# Patient Record
Sex: Female | Born: 1955 | Race: Black or African American | Hispanic: No | Smoking: Never smoker
Health system: Southern US, Community
[De-identification: ages and names within clinical notes are randomized; demographics above are authoritative.]

## PROBLEM LIST (undated history)

## (undated) DIAGNOSIS — F329 Major depressive disorder, single episode, unspecified: Secondary | ICD-10-CM

## (undated) DIAGNOSIS — G4752 REM sleep behavior disorder: Secondary | ICD-10-CM

## (undated) DIAGNOSIS — K219 Gastro-esophageal reflux disease without esophagitis: Secondary | ICD-10-CM

## (undated) DIAGNOSIS — K449 Diaphragmatic hernia without obstruction or gangrene: Secondary | ICD-10-CM

## (undated) DIAGNOSIS — M1990 Unspecified osteoarthritis, unspecified site: Secondary | ICD-10-CM

## (undated) DIAGNOSIS — K589 Irritable bowel syndrome without diarrhea: Secondary | ICD-10-CM

## (undated) DIAGNOSIS — F4310 Post-traumatic stress disorder, unspecified: Secondary | ICD-10-CM

## (undated) DIAGNOSIS — N809 Endometriosis, unspecified: Secondary | ICD-10-CM

## (undated) DIAGNOSIS — R55 Syncope and collapse: Secondary | ICD-10-CM

## (undated) DIAGNOSIS — J309 Allergic rhinitis, unspecified: Secondary | ICD-10-CM

## (undated) DIAGNOSIS — I10 Essential (primary) hypertension: Secondary | ICD-10-CM

## (undated) DIAGNOSIS — R441 Visual hallucinations: Secondary | ICD-10-CM

## (undated) DIAGNOSIS — G8929 Other chronic pain: Secondary | ICD-10-CM

## (undated) DIAGNOSIS — C649 Malignant neoplasm of unspecified kidney, except renal pelvis: Secondary | ICD-10-CM

## (undated) DIAGNOSIS — D759 Disease of blood and blood-forming organs, unspecified: Secondary | ICD-10-CM

## (undated) DIAGNOSIS — K222 Esophageal obstruction: Secondary | ICD-10-CM

## (undated) DIAGNOSIS — Z8639 Personal history of other endocrine, nutritional and metabolic disease: Secondary | ICD-10-CM

## (undated) DIAGNOSIS — T7840XA Allergy, unspecified, initial encounter: Secondary | ICD-10-CM

## (undated) DIAGNOSIS — E785 Hyperlipidemia, unspecified: Secondary | ICD-10-CM

## (undated) DIAGNOSIS — F419 Anxiety disorder, unspecified: Secondary | ICD-10-CM

## (undated) DIAGNOSIS — F32A Depression, unspecified: Secondary | ICD-10-CM

## (undated) DIAGNOSIS — I890 Lymphedema, not elsewhere classified: Secondary | ICD-10-CM

## (undated) DIAGNOSIS — H269 Unspecified cataract: Secondary | ICD-10-CM

## (undated) DIAGNOSIS — M549 Dorsalgia, unspecified: Secondary | ICD-10-CM

## (undated) DIAGNOSIS — K5909 Other constipation: Secondary | ICD-10-CM

## (undated) HISTORY — DX: Allergic rhinitis, unspecified: J30.9

## (undated) HISTORY — PX: BREAST SURGERY: SHX581

## (undated) HISTORY — DX: Post-traumatic stress disorder, unspecified: F43.10

## (undated) HISTORY — DX: Lymphedema, not elsewhere classified: I89.0

## (undated) HISTORY — PX: BREAST CYST EXCISION: SHX579

## (undated) HISTORY — DX: Other constipation: K59.09

## (undated) HISTORY — PX: HERNIA REPAIR: SHX51

## (undated) HISTORY — DX: Personal history of other endocrine, nutritional and metabolic disease: Z86.39

## (undated) HISTORY — PX: FUNCTIONAL ENDOSCOPIC SINUS SURGERY: SUR616

## (undated) HISTORY — DX: Unspecified osteoarthritis, unspecified site: M19.90

## (undated) HISTORY — DX: Endometriosis, unspecified: N80.9

## (undated) HISTORY — PX: HEMORRHOID BANDING: SHX5850

## (undated) HISTORY — DX: Diaphragmatic hernia without obstruction or gangrene: K44.9

## (undated) HISTORY — DX: Esophageal obstruction: K22.2

## (undated) HISTORY — PX: COLONOSCOPY: SHX174

## (undated) HISTORY — DX: Malignant neoplasm of unspecified kidney, except renal pelvis: C64.9

## (undated) HISTORY — DX: Essential (primary) hypertension: I10

## (undated) HISTORY — DX: Syncope and collapse: R55

## (undated) HISTORY — DX: Irritable bowel syndrome without diarrhea: K58.9

## (undated) HISTORY — DX: Gastro-esophageal reflux disease without esophagitis: K21.9

## (undated) HISTORY — DX: Unspecified cataract: H26.9

## (undated) HISTORY — DX: REM sleep behavior disorder: G47.52

## (undated) HISTORY — DX: Allergy, unspecified, initial encounter: T78.40XA

## (undated) HISTORY — PX: LASER ABLATION OF THE CERVIX: SHX1949

## (undated) HISTORY — PX: OTHER SURGICAL HISTORY: SHX169

## (undated) HISTORY — PX: TUBAL LIGATION: SHX77

## (undated) HISTORY — PX: UMBILICAL HERNIA REPAIR: SHX196

## (undated) HISTORY — PX: NASAL SINUS SURGERY: SHX719

## (undated) HISTORY — DX: Visual hallucinations: R44.1

## (undated) HISTORY — DX: Hyperlipidemia, unspecified: E78.5

---

## 2002-04-17 ENCOUNTER — Other Ambulatory Visit: Admission: RE | Admit: 2002-04-17 | Discharge: 2002-04-17 | Payer: Self-pay | Admitting: Family Medicine

## 2002-05-28 ENCOUNTER — Encounter: Payer: Self-pay | Admitting: Family Medicine

## 2002-05-28 ENCOUNTER — Encounter: Admission: RE | Admit: 2002-05-28 | Discharge: 2002-05-28 | Payer: Self-pay | Admitting: Family Medicine

## 2002-07-10 ENCOUNTER — Encounter: Payer: Self-pay | Admitting: Gastroenterology

## 2002-07-10 LAB — HM COLONOSCOPY: HM Colonoscopy: NORMAL

## 2002-08-12 ENCOUNTER — Ambulatory Visit (HOSPITAL_COMMUNITY): Admission: RE | Admit: 2002-08-12 | Discharge: 2002-08-12 | Payer: Self-pay | Admitting: Gastroenterology

## 2002-08-12 ENCOUNTER — Encounter: Payer: Self-pay | Admitting: Gastroenterology

## 2002-08-24 ENCOUNTER — Encounter (INDEPENDENT_AMBULATORY_CARE_PROVIDER_SITE_OTHER): Payer: Self-pay | Admitting: Specialist

## 2002-08-24 ENCOUNTER — Observation Stay (HOSPITAL_COMMUNITY): Admission: RE | Admit: 2002-08-24 | Discharge: 2002-08-25 | Payer: Self-pay | Admitting: *Deleted

## 2003-08-10 ENCOUNTER — Ambulatory Visit (HOSPITAL_COMMUNITY): Admission: RE | Admit: 2003-08-10 | Discharge: 2003-08-10 | Payer: Self-pay | Admitting: Internal Medicine

## 2003-08-19 ENCOUNTER — Other Ambulatory Visit: Admission: RE | Admit: 2003-08-19 | Discharge: 2003-08-19 | Payer: Self-pay | Admitting: *Deleted

## 2004-08-21 ENCOUNTER — Other Ambulatory Visit: Admission: RE | Admit: 2004-08-21 | Discharge: 2004-08-21 | Payer: Self-pay | Admitting: Obstetrics and Gynecology

## 2007-01-03 ENCOUNTER — Encounter: Admission: RE | Admit: 2007-01-03 | Discharge: 2007-01-03 | Payer: Self-pay | Admitting: Internal Medicine

## 2008-01-11 ENCOUNTER — Emergency Department (HOSPITAL_COMMUNITY): Admission: EM | Admit: 2008-01-11 | Discharge: 2008-01-11 | Payer: Self-pay | Admitting: Family Medicine

## 2008-01-20 ENCOUNTER — Ambulatory Visit (HOSPITAL_COMMUNITY): Admission: RE | Admit: 2008-01-20 | Discharge: 2008-01-20 | Payer: Self-pay | Admitting: Internal Medicine

## 2008-01-28 ENCOUNTER — Ambulatory Visit (HOSPITAL_COMMUNITY): Admission: RE | Admit: 2008-01-28 | Discharge: 2008-01-28 | Payer: Self-pay | Admitting: Internal Medicine

## 2008-09-28 ENCOUNTER — Encounter: Admission: RE | Admit: 2008-09-28 | Discharge: 2008-09-28 | Payer: Self-pay | Admitting: Otolaryngology

## 2008-10-11 ENCOUNTER — Encounter (INDEPENDENT_AMBULATORY_CARE_PROVIDER_SITE_OTHER): Payer: Self-pay | Admitting: Otolaryngology

## 2008-10-11 ENCOUNTER — Ambulatory Visit (HOSPITAL_BASED_OUTPATIENT_CLINIC_OR_DEPARTMENT_OTHER): Admission: RE | Admit: 2008-10-11 | Discharge: 2008-10-11 | Payer: Self-pay | Admitting: Otolaryngology

## 2008-12-09 ENCOUNTER — Encounter: Payer: Self-pay | Admitting: Gastroenterology

## 2008-12-23 LAB — CONVERTED CEMR LAB: Pap Smear: NORMAL

## 2009-02-25 ENCOUNTER — Encounter: Payer: Self-pay | Admitting: Cardiology

## 2009-02-25 ENCOUNTER — Encounter: Payer: Self-pay | Admitting: Internal Medicine

## 2009-02-25 LAB — CONVERTED CEMR LAB
ALT: 18 units/L
AST: 24 units/L
Albumin: 4.1 g/dL
Alkaline Phosphatase: 85 units/L
BUN: 14 mg/dL
CO2: 23 meq/L
Calcium: 9.7 mg/dL
Chloride: 104 meq/L
Creatinine, Ser: 0.92 mg/dL
Glucose, Bld: 93 mg/dL
Potassium: 3.6 meq/L
Sodium: 143 meq/L
Total Bilirubin: 0.3 mg/dL
Total Protein: 7.3 g/dL

## 2009-03-11 ENCOUNTER — Encounter: Payer: Self-pay | Admitting: Internal Medicine

## 2009-03-11 LAB — CONVERTED CEMR LAB
Cholesterol: 203 mg/dL
HDL: 59 mg/dL
LDL (calc): 13 mg/dL
LDL Cholesterol: 131 mg/dL
TSH: 2.26 microintl units/mL
Triglyceride fasting, serum: 66 mg/dL

## 2009-03-15 ENCOUNTER — Encounter: Payer: Self-pay | Admitting: Gastroenterology

## 2009-03-23 ENCOUNTER — Ambulatory Visit (HOSPITAL_COMMUNITY): Admission: RE | Admit: 2009-03-23 | Discharge: 2009-03-23 | Payer: Self-pay | Admitting: Internal Medicine

## 2009-03-29 ENCOUNTER — Encounter (INDEPENDENT_AMBULATORY_CARE_PROVIDER_SITE_OTHER): Payer: Self-pay | Admitting: *Deleted

## 2009-04-25 ENCOUNTER — Encounter: Payer: Self-pay | Admitting: Internal Medicine

## 2009-05-06 DIAGNOSIS — I10 Essential (primary) hypertension: Secondary | ICD-10-CM | POA: Insufficient documentation

## 2009-05-06 DIAGNOSIS — K219 Gastro-esophageal reflux disease without esophagitis: Secondary | ICD-10-CM | POA: Insufficient documentation

## 2009-05-06 DIAGNOSIS — K5909 Other constipation: Secondary | ICD-10-CM | POA: Insufficient documentation

## 2009-05-06 DIAGNOSIS — N809 Endometriosis, unspecified: Secondary | ICD-10-CM | POA: Insufficient documentation

## 2009-05-06 HISTORY — DX: Essential (primary) hypertension: I10

## 2009-05-06 HISTORY — DX: Endometriosis, unspecified: N80.9

## 2009-05-12 ENCOUNTER — Ambulatory Visit: Payer: Self-pay | Admitting: Gastroenterology

## 2009-05-16 DIAGNOSIS — R55 Syncope and collapse: Secondary | ICD-10-CM | POA: Insufficient documentation

## 2009-05-16 HISTORY — DX: Syncope and collapse: R55

## 2009-05-25 ENCOUNTER — Encounter: Admission: RE | Admit: 2009-05-25 | Discharge: 2009-06-22 | Payer: Self-pay | Admitting: Internal Medicine

## 2009-06-01 ENCOUNTER — Ambulatory Visit: Payer: Self-pay | Admitting: Cardiology

## 2009-06-14 ENCOUNTER — Ambulatory Visit: Payer: Self-pay | Admitting: Internal Medicine

## 2009-06-14 DIAGNOSIS — J329 Chronic sinusitis, unspecified: Secondary | ICD-10-CM | POA: Insufficient documentation

## 2009-06-14 DIAGNOSIS — Z9189 Other specified personal risk factors, not elsewhere classified: Secondary | ICD-10-CM | POA: Insufficient documentation

## 2009-06-14 DIAGNOSIS — IMO0002 Reserved for concepts with insufficient information to code with codable children: Secondary | ICD-10-CM | POA: Insufficient documentation

## 2009-06-14 DIAGNOSIS — R519 Headache, unspecified: Secondary | ICD-10-CM | POA: Insufficient documentation

## 2009-06-14 DIAGNOSIS — Z8639 Personal history of other endocrine, nutritional and metabolic disease: Secondary | ICD-10-CM | POA: Insufficient documentation

## 2009-06-14 DIAGNOSIS — G43909 Migraine, unspecified, not intractable, without status migrainosus: Secondary | ICD-10-CM

## 2009-06-14 DIAGNOSIS — Z860100 Personal history of colon polyps, unspecified: Secondary | ICD-10-CM | POA: Insufficient documentation

## 2009-06-14 DIAGNOSIS — R51 Headache: Secondary | ICD-10-CM

## 2009-06-14 DIAGNOSIS — M1710 Unilateral primary osteoarthritis, unspecified knee: Secondary | ICD-10-CM

## 2009-06-14 HISTORY — DX: Migraine, unspecified, not intractable, without status migrainosus: G43.909

## 2009-06-16 ENCOUNTER — Ambulatory Visit: Payer: Self-pay | Admitting: Internal Medicine

## 2009-06-16 ENCOUNTER — Encounter: Payer: Self-pay | Admitting: Cardiology

## 2009-06-16 ENCOUNTER — Ambulatory Visit (HOSPITAL_COMMUNITY): Admission: RE | Admit: 2009-06-16 | Discharge: 2009-06-16 | Payer: Self-pay | Admitting: Cardiology

## 2009-06-16 ENCOUNTER — Ambulatory Visit: Payer: Self-pay

## 2009-06-30 ENCOUNTER — Encounter: Payer: Self-pay | Admitting: Internal Medicine

## 2009-06-30 ENCOUNTER — Encounter: Admission: RE | Admit: 2009-06-30 | Discharge: 2009-09-28 | Payer: Self-pay | Admitting: Internal Medicine

## 2009-07-01 ENCOUNTER — Telehealth: Payer: Self-pay | Admitting: Internal Medicine

## 2009-07-07 ENCOUNTER — Encounter (INDEPENDENT_AMBULATORY_CARE_PROVIDER_SITE_OTHER): Payer: Self-pay | Admitting: *Deleted

## 2009-07-18 ENCOUNTER — Encounter: Payer: Self-pay | Admitting: Internal Medicine

## 2009-07-18 ENCOUNTER — Telehealth: Payer: Self-pay | Admitting: Internal Medicine

## 2009-08-22 ENCOUNTER — Ambulatory Visit: Payer: Self-pay | Admitting: Internal Medicine

## 2009-08-22 DIAGNOSIS — M79609 Pain in unspecified limb: Secondary | ICD-10-CM | POA: Insufficient documentation

## 2009-08-22 DIAGNOSIS — J40 Bronchitis, not specified as acute or chronic: Secondary | ICD-10-CM | POA: Insufficient documentation

## 2009-08-26 ENCOUNTER — Ambulatory Visit: Payer: Self-pay | Admitting: Gastroenterology

## 2009-08-26 DIAGNOSIS — F449 Dissociative and conversion disorder, unspecified: Secondary | ICD-10-CM | POA: Insufficient documentation

## 2009-08-30 ENCOUNTER — Telehealth: Payer: Self-pay | Admitting: Internal Medicine

## 2009-08-31 ENCOUNTER — Ambulatory Visit: Payer: Self-pay | Admitting: Internal Medicine

## 2009-08-31 DIAGNOSIS — J309 Allergic rhinitis, unspecified: Secondary | ICD-10-CM | POA: Insufficient documentation

## 2009-08-31 HISTORY — DX: Allergic rhinitis, unspecified: J30.9

## 2009-09-04 LAB — CONVERTED CEMR LAB: Hgb A1c MFr Bld: 5.9 % (ref 4.6–6.5)

## 2009-09-19 ENCOUNTER — Ambulatory Visit (HOSPITAL_COMMUNITY): Admission: RE | Admit: 2009-09-19 | Discharge: 2009-09-19 | Payer: Self-pay | Admitting: Gastroenterology

## 2009-09-19 ENCOUNTER — Encounter: Payer: Self-pay | Admitting: Gastroenterology

## 2009-09-28 ENCOUNTER — Ambulatory Visit: Payer: Self-pay | Admitting: Gastroenterology

## 2009-10-03 ENCOUNTER — Ambulatory Visit: Payer: Self-pay | Admitting: Internal Medicine

## 2009-10-03 DIAGNOSIS — E669 Obesity, unspecified: Secondary | ICD-10-CM | POA: Insufficient documentation

## 2009-10-31 ENCOUNTER — Telehealth: Payer: Self-pay | Admitting: Internal Medicine

## 2009-11-03 ENCOUNTER — Telehealth: Payer: Self-pay | Admitting: Internal Medicine

## 2009-12-14 ENCOUNTER — Ambulatory Visit: Payer: Self-pay | Admitting: Internal Medicine

## 2009-12-22 ENCOUNTER — Emergency Department (HOSPITAL_COMMUNITY): Admission: EM | Admit: 2009-12-22 | Discharge: 2009-12-22 | Payer: Self-pay | Admitting: Emergency Medicine

## 2010-03-15 ENCOUNTER — Telehealth: Payer: Self-pay | Admitting: Internal Medicine

## 2010-03-15 ENCOUNTER — Ambulatory Visit: Payer: Self-pay | Admitting: Internal Medicine

## 2010-03-15 DIAGNOSIS — I890 Lymphedema, not elsewhere classified: Secondary | ICD-10-CM | POA: Insufficient documentation

## 2010-03-16 DIAGNOSIS — E7800 Pure hypercholesterolemia, unspecified: Secondary | ICD-10-CM

## 2010-03-16 DIAGNOSIS — E785 Hyperlipidemia, unspecified: Secondary | ICD-10-CM

## 2010-03-16 HISTORY — DX: Pure hypercholesterolemia, unspecified: E78.00

## 2010-03-16 HISTORY — DX: Hyperlipidemia, unspecified: E78.5

## 2010-03-16 LAB — CONVERTED CEMR LAB
BUN: 15 mg/dL (ref 6–23)
CO2: 35 meq/L — ABNORMAL HIGH (ref 19–32)
Calcium: 10.1 mg/dL (ref 8.4–10.5)
Chloride: 100 meq/L (ref 96–112)
Cholesterol: 205 mg/dL — ABNORMAL HIGH (ref 0–200)
Creatinine, Ser: 0.8 mg/dL (ref 0.4–1.2)
Direct LDL: 140.5 mg/dL
GFR calc non Af Amer: 98.74 mL/min (ref >60)
Glucose, Bld: 91 mg/dL (ref 70–99)
HDL: 53.4 mg/dL (ref >39.00)
Hgb A1c MFr Bld: 6 % (ref 4.6–6.5)
Potassium: 4 meq/L (ref 3.5–5.1)
Sodium: 142 meq/L (ref 135–145)
Total CHOL/HDL Ratio: 4
Triglycerides: 95 mg/dL (ref 0.0–149.0)
VLDL: 19 mg/dL (ref 0.0–40.0)

## 2010-03-22 ENCOUNTER — Telehealth: Payer: Self-pay | Admitting: Internal Medicine

## 2010-03-24 ENCOUNTER — Ambulatory Visit (HOSPITAL_COMMUNITY): Admission: RE | Admit: 2010-03-24 | Discharge: 2010-03-24 | Payer: Self-pay | Admitting: Internal Medicine

## 2010-03-24 LAB — HM MAMMOGRAPHY: HM Mammogram: NEGATIVE

## 2010-04-05 ENCOUNTER — Telehealth (INDEPENDENT_AMBULATORY_CARE_PROVIDER_SITE_OTHER): Payer: Self-pay | Admitting: *Deleted

## 2010-04-14 ENCOUNTER — Encounter: Payer: Self-pay | Admitting: Internal Medicine

## 2010-04-14 ENCOUNTER — Ambulatory Visit: Payer: Self-pay | Admitting: Internal Medicine

## 2010-04-14 ENCOUNTER — Telehealth: Payer: Self-pay | Admitting: Internal Medicine

## 2010-04-18 ENCOUNTER — Encounter: Payer: Self-pay | Admitting: Internal Medicine

## 2010-07-23 LAB — CONVERTED CEMR LAB
BUN: 17 mg/dL
CO2: 27 meq/L
Chloride: 101 meq/L
Creatinine, Ser: 0.87 mg/dL
Glucose, Bld: 82 mg/dL
Hgb A1c MFr Bld: 6.2 %
Potassium: 3.9 meq/L
Sodium: 140 meq/L

## 2010-07-25 NOTE — Progress Notes (Signed)
Summary: ABX refill  Phone Note Call from Patient Call back at Home Phone [phone]   Caller: Patient Summary of Call: pt called stating that she is still having cough and increased nasal mucous. pt is requesting refill of ABX and cough meds. please advise Initial call taken by: Margaret Pyle, CMA,  August 30, 2009 1:57 PM  Follow-up for Phone Call        ok to re-tx once (zpack and renew promethazne vc - can call in) but if cont symptoms after this round, will need re-eval with OV - thanks Follow-up by: Newt Lukes MD,  August 30, 2009 2:56 PM    New/Updated Medications: ZITHROMAX 250 MG TABS (AZITHROMYCIN) 2 tabs by mouth today then 1 tab by mouth  every day for 5 days Prescriptions: PROMETHAZINE VC 6.25-5 MG/5ML SYRP (PROMETHAZINE-PHENYLEPHRINE) 5 cc by mouth every 4 hours as needed for cough  #120cc x 0   Entered by:   Margaret Pyle, CMA   Authorized by:   Newt Lukes MD   Signed by:   Margaret Pyle, CMA on 08/30/2009   Method used:   [facility]        [facility] [address] [phone]* (retail)       [address]       [place], [place]  [address]       Ph: [phone] or [phone]       Fax: [phone]   RxID:   [id number] ZITHROMAX 250 MG TABS (AZITHROMYCIN) 2 tabs by mouth today then 1 tab by mouth  every day for 5 days  #6 x 0   Entered by:   Margaret Pyle, CMA   Authorized by:   Newt Lukes MD   Signed by:   Margaret Pyle, CMA on 08/30/2009   Method used:   [facility]ically to        [facility] [address] [phone]* (retail)       [address]       [place], [place]  [address]       Ph: [phone] or [phone]       Fax: [phone]   RxID:   [id number]

## 2010-07-25 NOTE — Progress Notes (Signed)
Summary: rx request  Phone Note Call from Patient Call back at Home Phone [phone]   Caller: Patient Summary of Call: pt called requesting New Rx to mail order co, [facility] due to Insurance change. Initial call taken by: Margaret Pyle, CMA,  July 18, 2009 3:58 PM    Prescriptions: FLUTICASONE PROPIONATE 50 MCG/ACT SUSP (FLUTICASONE PROPIONATE) 1 spray each nostril  every morning  #3 x 3   Entered by:   Margaret Pyle, CMA   Authorized by:   Newt Lukes MD   Signed by:   Margaret Pyle, CMA on 07/18/2009   Method used:   Faxed to ...       [facility]* (mail-order)             ,          Ph: [phone]       Fax: [phone]   RxID:   [id number] DEXILANT 60 MG CPDR (DEXLANSOPRAZOLE) 1 by mouth q am  #90 x 3   Entered by:   Margaret Pyle, CMA   Authorized by:   Newt Lukes MD   Signed by:   Margaret Pyle, CMA on 07/18/2009   Method used:   Faxed to ...       [facility]* (mail-order)             ,          Ph: [phone]       Fax: [phone]   RxID:   [id number] DIOVAN HCT 320-25 MG TABS (VALSARTAN-HYDROCHLOROTHIAZIDE) once daily  #90 x 3   Entered by:   Margaret Pyle, CMA   Authorized by:   Newt Lukes MD   Signed by:   Margaret Pyle, CMA on 07/18/2009   Method used:   Faxed to ...       [facility]* (mail-order)             ,          Ph: [phone]       Fax: [phone]   RxID:   [id number]

## 2010-07-25 NOTE — Assessment & Plan Note (Signed)
Summary: CHRONIC REFLUX/YF   History of Present Illness Visit Type: Initial Consult Primary GI MD: Sheryn Bison MD FACP FAGA Primary Provider: Kellie Shropshire, MD Requesting Provider: Amada Jupiter, MD Chief Complaint: GERD / controlled with medicaition116 History of Present Illness:   This is a very complex 55 year old African American female with multiple medical problems who comes to the office today complaining mostly of intermittent syncope which has been present apparently since childhood without a definite diagnosis identified. She's a patient of Dr. Kellie Shropshire and wants to switch primary care doctors. 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 She denies palpitations or exertional chest pain or history of known peripheral vascular disease.  His chronic acid reflux which has been evaluated previously with endoscopy. She currently is on Prilosec without control of her reflux symptoms which she describes mostly as a globus sensation in her throat without true regurgitation or burning substernal pain. She suffered from chronic obesity. She has chronic functional constipation with previous negative colonoscopy. She denies significant intolerances. She denies rectal bleeding or chronic anemia. I have no real records for review today his septum or ENT doctor who has performed surgery per her chronic sinusitis. She continues with postnasal drip and nasal congestion. She does not have true dysphagia anorexia or weight loss.  She currently sees gynecology and has had a previous right ovarian cyst in 2004 with gynecologic surgery. Again I do not have a records of gynecologic recent evaluations.   GI Review of Systems    Reports acid reflux and  bloating.      Denies abdominal pain, belching, chest pain, dysphagia with liquids, dysphagia with solids, heartburn, loss of appetite, nausea, vomiting, vomiting blood, weight loss, and  weight gain.     Reports constipation.     Denies anal fissure, black tarry stools, change in bowel habit, diarrhea, diverticulosis, fecal incontinence, heme positive stool, hemorrhoids, irritable bowel syndrome, jaundice, light color stool, liver problems, rectal bleeding, and  rectal pain. Preventive Screening-Counseling & Management  Alcohol-Tobacco     Smoking Status: never      Drug Use:  no.      Current Medications (verified): 1)  Omeprazole 20 Mg Cpdr (Omeprazole) .[name].. 1 By Mouth Qd 2)  Lortab 7.5-500 Mg/54ml Elix (Hydrocodone-Acetaminophen) .[name].. 1 By Mouth Q 4-6 Hrs Prn 3)  Diovan Hct 320-25 Mg Tabs (Valsartan-Hydrochlorothiazide) .... Once Daily 4)  Promethazine Hcl 25 Mg Supp (Promethazine Hcl) .... Insert One Per Recutm Q 6 Hrs Prn 5)  Omega-3 Complex 192-251-11 Mg-Mg-Unit Caps (Dha-Epa-Vitamin E) .... Take 2 Capsules By Mouth Daily 6)  Emergen-C .... Take 1 Packet Daaily 7)  Ibuprofen 800 Mg Tabs (Ibuprofen) .... Three Times A Day 8)  Vitamin E 400 Unit Caps (Vitamin E) .... Once Daily 9)  Polyethylene Glycol 3350  Powd (Polyethylene Glycol 3350) .... As Needed 10)  Vitamin D3 1000 Unit Tabs (Cholecalciferol) .... Take 2 Tablets Daily  Allergies (verified): 1)  ! Codeine 2)  ! Darvon  Past History:  Past medical, surgical, family and social histories (including risk factors) reviewed for relevance to current acute and chronic problems.  Past Medical History: Reviewed history from 05/06/2009 and no changes required. Current Problems:  CONSTIPATION, CHRONIC (ICD-564.09) GERD (ICD-530.81) ENDOMETRIOSIS (ICD-617.9) HYPERTENSION (ICD-401.9)  Past Surgical History: Hernia Surgery Bilateral Breast Surgery Bilateral Tubal Ligation Ablation  Family History: Reviewed history and no changes required. Family History of Prostate Cancer:Father Family History of Colitis/Crohn'sBrother: Family History of Diabetes: Mother Family History  of Kidney Disease:Father  Social  History: Reviewed history and no changes required. 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  Alcohol Use - no Illicit Drug Use - no Smoking Status:  never Drug Use:  no  Review of Systems       The patient complains of allergy/sinus, anxiety-new, arthritis/joint pain, change in vision, cough, depression-new, fatigue, headaches-new, sleeping problems, swelling of feet/legs, and vision changes.  The patient denies anemia, back pain, blood in urine, breast changes/lumps, confusion, coughing up blood, fainting, fever, hearing problems, heart murmur, heart rhythm changes, itching, menstrual pain, muscle pains/cramps, night sweats, nosebleeds, pregnancy symptoms, shortness of breath, skin rash, sore throat, swollen lymph glands, thirst - excessive , urination - excessive , urination changes/pain, urine leakage, and voice change.    Vital Signs:  Patient profile:   55 year old female Height:      62 inches Weight:      240 pounds BMI:     44.06 Pulse rate:   60 / minute Pulse rhythm:   regular BP sitting:   116 / 78  (left arm) Cuff size:   large  Vitals Entered By: June McMurray CMA Duncan Dull) (May 12, 2009 10:17 AM)  Physical Exam  General:  Well developed, well nourished, no acute distress.obese.   Head:  Normocephalic and atraumatic. Eyes:  PERRLA, no icterus.exam deferred to patient's ophthalmologist.   Mouth:  No deformity or lesions, dentition normal. Neck:  Supple; no masses or thyromegaly. Lungs:  Clear throughout to auscultation. Heart:  Regular rate and rhythm; no murmurs, rubs,  or bruits. Abdomen:  Soft, nontender and nondistended. No masses, hepatosplenomegaly or hernias noted. Normal bowel sounds.obese.   Pulses:  Normal pulses noted. Extremities:  No clubbing, cyanosis, edema or deformities noted. Neurologic:  Alert and  oriented x4;  grossly normal neurologically. Cervical Nodes:  No significant cervical adenopathy. Inguinal Nodes:  No significant  inguinal adenopathy. Psych:  Alert and cooperative. Normal mood and affect.   Impression & Recommendations:  Problem # 1:  CONSTIPATION, CHRONIC (ICD-564.09) Assessment Unchanged I have urged her to take MiraLax 8 ounces on a regular basis at bedtime. I do not think she needs repeat colonoscopy at this time.  Problem # 2:  GERD (ICD-530.81) Assessment: Unchanged Change to Dexilant 60 mg 30 minutes before breakfast along with standard antireflux maneuvers.  Problem # 3:  HYPERTENSION (ICD-401.9) Assessment: Improved blood pressure today is 116/78 and her to continue her antihypertensive medications. Cause of her recurrent syncope which may be from autonomic dysfunction I've scheduled her to see cardiology for further evaluation. At her request, we will also try to set her up for primary care visit with our practice.  Patient Instructions: 1)  Copy sent to : [facility]  2)  Please continue current medications.  3)  Avoid foods high in acid content ( tomatoes, citrus juices, spicy foods) . Avoid eating within 3 to 4 hours of lying down or before exercising. Do not over eat; try smaller more frequent meals. Elevate head of bed four inches when sleeping.  4)  change from Nexium to Dexilant 5)  High Fiber, Low Fat  Healthy Eating Plan brochure given.  6)  Constipation and Hemorrhoids brochure given.   Appended Document: CHRONIC REFLUX/YF    Clinical Lists Changes  Medications: Removed medication of OMEPRAZOLE 20 MG CPDR (OMEPRAZOLE) 1 by mouth qd Added new medication of DEXILANT 60 MG CPDR (DEXLANSOPRAZOLE) 1 by mouth q am - Signed Added new medication of POLYETHYLENE  GLYCOL 3350  POWD (POLYETHYLENE GLYCOL 3350) Take q hs as directed - Signed Rx of DEXILANT 60 MG CPDR (DEXLANSOPRAZOLE) 1 by mouth q am;  #30 x 6;  Signed;  Entered by: Ashok Cordia RN;  Authorized by: Mardella Layman MD [facility];  Method used: [facility] [address] [phone]*, [address] [place], [place], [place]  [address], Ph: [phone] or [phone], Fax: [phone] Rx of POLYETHYLENE GLYCOL 3350  POWD (POLYETHYLENE GLYCOL 3350) Take q hs as directed;  #522 gm x 11;  Signed;  Entered by: Ashok Cordia RN;  Authorized by: Mardella Layman MD [facility];  Method used: Electronically to [facility] [address] [phone]*, [address] [place], [place], [place]  [address], Ph: [phone] or [phone], Fax: [phone]    Prescriptions: POLYETHYLENE GLYCOL 3350  POWD (POLYETHYLENE GLYCOL 3350) Take q hs as directed  #522 gm x 11   Entered by:   Ashok Cordia RN   Authorized by:   Mardella Layman MD [facility]   Signed by:   Ashok Cordia RN on 05/12/2009   Method used:   Electronically to        [facility] [address] [phone]* (retail)       [address]       [place], [place]  [address]       Ph: [phone] or [phone]       Fax: [phone]   RxID:   [id number] DEXILANT 60 MG CPDR (DEXLANSOPRAZOLE) 1 by mouth q am  #30 x 6   Entered by:   Ashok Cordia RN   Authorized by:   Mardella Layman MD [facility]   Signed by:   Ashok Cordia RN on 05/12/2009   Method used:   Electronically to        [facility] [address] [phone]* (retail)       [address]       [place], [place]  [address]       Ph: [phone] or [phone]       Fax: [phone]   RxID:   [id number]    Appended Document: CHRONIC REFLUX/YF    Clinical Lists Changes  Problems: Added new problem of SYNCOPE (ICD-780.2) Orders: Added new Referral order of Cardiology Referral (Cardiology) - Signed Added new Referral order of Primary Care Referral (Primary) - Signed      Appended Document: CHRONIC REFLUX/YF Left message for pt to call re referrals.  Appended Document: CHRONIC REFLUX/YF Pt notified of referrals.

## 2010-07-25 NOTE — Procedures (Signed)
Summary: manometry & PH   Esophageal Manometry  Procedure date:  09/19/2009  Findings:      normal:     Esophageal manometry and 24-hour pH probe testing was completed on September 19, 2009. Results are as follows:        Esophageal Manometry  #1. Lower esophageal sphincter-normal mean pressure of 17 mm of mercury with normal relaxation of swallowing  #2. Upper-Esophageal sphincter-normal coordination between pharyngeal contraction and cricopharyngeal relaxation  #3 Motility Pattern-there is normal esophageal peristalsis with high amplitude contractions but no spontaneous or repetitive contractions. Mean amplitude is 198 mmHg with duration 5.2 seconds.  Assessment: This is normal esophageal manometry.               24 hour pH probe results  Total DeMeester score is normal at 4.8 normal less than 22. Percentage of time the pH is less than 4 is normal at 1.3%. Her 24 reflux episodes the longest lasting 2 minutes. There is no significant acid reflux and the proximal or distal channels in the upright or supine position. Symptom analysis is entirely negative.  Assessment: This is normal 24 pH probe test without evidence of acid reflux. I do not think that this patient's globus reaction is on the basis of GERD. We'll recommend primary care followup.   Appended Document: manometry Pt notified of results.

## 2010-07-25 NOTE — Assessment & Plan Note (Signed)
Summary: 6 mo rov /nws   Vital Signs:  Patient profile:   55 year old female Height:      62 inches (157.48 cm) Weight:      228.8 pounds (104 kg) O2 Sat:      99 % on Room air Temp:     97.8 degrees F (36.56 degrees C) oral Pulse rate:   85 / minute BP sitting:   118 / 76  (left arm) Cuff size:   large  Vitals Entered By: Orlan Leavens (December 14, 2009 10:16 AM)  O2 Flow:  Room air CC: 6 month follow-up Is Patient Diabetic? Yes Did you bring your meter with you today? No Pain Assessment Patient in pain? no        Primary Care Provider:  Newt Lukes MD  CC:  6 month follow-up.  History of Present Illness: 1) left foot neuropathy  cont pain left lateral foot but intermittent and less severe -  describes as "burning" saw podiatry for same - no dx made: "everything is fine" no injury - no new footware - no numbness into leg or medial side  2) obesity - working hard with meal replacement and exercise to lose weight - feels clothes are fitting better - size 20, now 18 - down 4 lbs since 09/2009  3) seasonal allg - worse during spring season - prefers claritin to Careers adviser for symptoms control - uses nasal spray most days no fever or sinus pressure or pain - no HA or cough or ST  4) GERD - reports indigestion better if taking ppi meds - no pain or reflux or change in BM -100% med compliance  5) HTN - reports compliance with ongoing medical treatment and no changes in medication dose or frequency. denies adverse side effects related to current therapy. no increase in edema, no CP or HA  6) diet controlled DM - watching carbs and losing weight efforts as above - take cinnamon but no med rx - does not check home cbg regularly  Preventive Screening-Counseling & Management  Alcohol-Tobacco     Alcohol drinks/day: <1     Smoking Status: quit > 6 months     Tobacco Counseling: to remain off tobacco products  Caffeine-Diet-Exercise     Caffeine use/day: 0     Diet  Counseling: to improve diet; diet is suboptimal     Does Patient Exercise: no     Exercise Counseling: to improve exercise regimen     Depression Counseling: not indicated; screening negative for depression  Clinical Review Panels:  Prevention   Last Mammogram:  normal (12/23/2008)   Last Pap Smear:  normal (12/23/2008)   Last Colonoscopy:  Location:  [facility].  (07/10/2002)  Lipid Management   Cholesterol:  203 (03/11/2009)   LDL (bad choesterol):  131 (03/11/2009)   HDL (good cholesterol):  59 (03/11/2009)   Triglycerides:  66 (03/11/2009)  Diabetes Management   HgBA1C:  5.9 (08/31/2009)   Creatinine:  0.87 (04/25/2009)   Last Flu Vaccine:  Historical (03/25/2009)  Complete Metabolic Panel   Glucose:  82 (04/25/2009)   Sodium:  140 (04/25/2009)   Potassium:  3.9 (04/25/2009)   Chloride:  101 (04/25/2009)   CO2:  27 (04/25/2009)   BUN:  17 (04/25/2009)   Creatinine:  0.87 (04/25/2009)   Albumin:  4.1 (02/25/2009)   Total Protein:  7.3 (02/25/2009)   Calcium:  9.7 (02/25/2009)   Total Bili:  0.3 (02/25/2009)   Alk Phos:  85 (  02/25/2009)   SGPT (ALT):  18 (02/25/2009)   SGOT (AST):  24 (02/25/2009)   Current Medications (verified): 1)  Diovan Hct 320-25 Mg Tabs (Valsartan-Hydrochlorothiazide) .... Once Daily 2)  Omega-3 Complex 192-251-11 Mg-Mg-Unit Caps (Dha-Epa-Vitamin E) .... Take 2 Capsules By Mouth Daily 3)  Ibuprofen 800 Mg Tabs (Ibuprofen) .... Three Times A Day 4)  Vitamin E 400 Unit Caps (Vitamin E) .... Once Daily 5)  Polyethylene Glycol 3350  Powd (Polyethylene Glycol 3350) .... As Needed 6)  Dexilant 60 Mg Cpdr (Dexlansoprazole) .[name].. 1 By Mouth Q Am 7)  Accu-Chek Compact Test Drum  Strp (Glucose Blood) .... Test 1-2 Times Daily As Needed Dx: 790.29 8)  Accu-Chek Compact Plus Care  Kit (Blood Glucose Monitoring Suppl) .... Glucometer Test 1-2 Times Per Day As Needed Dx: 790.29 9)  Calcium 600mg  T Vitamin D 1000mg  .... Take 1 Two Times A  Day 10)  Cinnamon .[name].. 1 Tsp By Mouth Two Times A Day 11)  Calcium-Vitamin D [phone] Mg-Unit Tabs (Calcium-Vitamin D) .... Take 4 Once Daily 12)  Fluticasone Propionate 50 Mcg/act Susp (Fluticasone Propionate) .[name].. 1 Spray Each Nostril  Every Morning 13)  Loratadine 10 Mg Tabs (Loratadine) .[name].. 1 By Mouth Every Evening 14)  Energizing Soy Protein .... Once Q Am 15)  Vanilla Shake Mix .... Q Am 16)  Osteomatrix .... Take 1 Q Am 17)  Gabapentin 300 Mg Caps (Gabapentin) .[name].. 1 By Mouth At Bedtime For Foot Pain 18)  Accu-Chek Softclix Lancets  Misc (Lancets) .... Use As Directed Two Times A Day  Allergies (verified): 1)  ! Codeine 2)  ! Darvon  Past History:  Past Medical History: 1. CONSTIPATION, CHRONIC  2. GERD  with hiatal hernia.  3. ENDOMETRIOSIS 4. HYPERTENSION 5. Diabetes: diet-controlled.  6. Syncopal episodes since childhood. 7. Chronic low back pain. 8. Chronic sinusitis.  MD roster- cards - [facility] ([place])  Review of Systems  The patient denies fever, chest pain, syncope, and headaches.    Physical Exam  General:  overweight-appearing.  alert, well-developed, well-nourished, and cooperative to examination.   g-son at side Lungs:   normal respiratory effort, no intercostal retractions or use of accessory muscles; normal breath sounds bilaterally - no crackles and no wheezes.    Heart:  normal rate, regular rhythm, no murmur, and no rub. BLE with chronic lymphedema, L>R.  Psych:  Oriented X3, memory intact for recent and remote, normally interactive, good eye contact, not anxious appearing, not depressed appearing, and not agitated.      Impression & Recommendations:  Problem # 1:  DIABETES MELLITUS, BORDERLINE (ICD-790.29)  dx per pt from prior PCP - diet controlled cont nutrition counseling and encouraged p maitain increased phys activity along with diet changes in effort to continue losing weight and  control her dz Time spent with patient 25 minutes, more than 50% of this time was spent counseling patient on diabetes and sinusitus and problems concerning the need for medication compliance as welll as weight loss and exercise for health mgmt  Labs Reviewed: Creat: 0.87 (04/25/2009)    A1c - 6.2 08/2009  Problem # 2:  OBESITY (ICD-278.00)  motivated by desire to avoid DM - encouraged cont of same efforts  Ht: 62 (10/03/2009)   Wt: 232.12 (10/03/2009)   BMI: 42.84 (08/26/2009)  Ht: 62 (12/14/2009)   Wt: 228.8 (12/14/2009)     Problem # 3:  HYPERTENSION (ICD-401.9)  add as needed lasix for edema - worse in summer -  Her updated medication list for this problem includes:    Diovan Hct 320-25 Mg Tabs (Valsartan-hydrochlorothiazide) ..... Once daily    Furosemide 20 Mg Tabs (Furosemide) .[name]... 1 by mouth once daily or as directed  BP today: 118/76 Prior BP: 124/72 (10/03/2009)  Labs Reviewed: K+: 3.9 (04/25/2009) Creat: : 0.87 (04/25/2009)   Chol: 203 (03/11/2009)   HDL: 59 (03/11/2009)   LDL: 13 (03/11/2009)   TG: 66 (03/11/2009)  Problem # 4:  GERD (ICD-530.81)  Her updated medication list for this problem includes:    Dexilant 60 Mg Cpdr (Dexlansoprazole) .[name]... 1 by mouth q am  EGD: Location: [facility]   (07/10/2002)  Problem # 5:  ALLERGIC RHINITIS (ICD-477.9)  Her updated medication list for this problem includes:    Fluticasone Propionate 50 Mcg/act Susp (Fluticasone propionate) .[name]... 1 spray each nostril  every morning    Loratadine 10 Mg Tabs (Loratadine) .[name]... 1 by mouth every evening  Discussed use of allergy medications and environmental measures.   Complete Medication List: 1)  Diovan Hct 320-25 Mg Tabs (Valsartan-hydrochlorothiazide) .... Once daily 2)  Omega-3 Complex 192-251-11 Mg-mg-unit Caps (Dha-epa-vitamin e) .... Take 2 capsules by mouth daily 3)  Ibuprofen 800 Mg Tabs (Ibuprofen) .... Three times a day 4)  Vitamin E 400 Unit Caps  (Vitamin e) .... Once daily 5)  Polyethylene Glycol 3350 Powd (Polyethylene glycol 3350) .... As needed 6)  Dexilant 60 Mg Cpdr (Dexlansoprazole) .[name].. 1 by mouth q am 7)  Accu-chek Compact Test Drum Strp (Glucose blood) .... Test 1-2 times daily as needed dx: 790.29 8)  Accu-chek Compact Plus Care Kit (Blood glucose monitoring suppl) .... Glucometer test 1-2 times per day as needed dx: 790.29 9)  Calcium 600mg  T Vitamin D 1000mg   .... Take 1 two times a day 10)  Cinnamon  .[name].. 1 tsp by mouth two times a day 11)  Calcium-vitamin D [phone] Mg-unit Tabs (calcium-vitamin D)  .... Take 4 once daily 12)  Fluticasone Propionate 50 Mcg/act Susp (Fluticasone propionate) .[name].. 1 spray each nostril  every morning 13)  Loratadine 10 Mg Tabs (Loratadine) .[name].. 1 by mouth every evening 14)  Energizing Soy Protein  .... Once q am 15)  Vanilla Shake Mix  .... Q am 16)  Osteomatrix  .... Take 1 q am 17)  Gabapentin 300 Mg Caps (Gabapentin) .[name].. 1 by mouth at bedtime for foot pain 18)  Accu-chek Softclix Lancets Misc (Lancets) .... Use as directed two times a day 19)  Furosemide 20 Mg Tabs (Furosemide) .[name].. 1 by mouth once daily or as directed  Patient Instructions: 1)  it was good to see you today. 2)  start new medication for fluid in your feet - lasix (furosemide) - your prescription has been electronically submitted to [facility]. Please take as directed. Contact our office if you believe you're having problems with the medication(s).  3)  continue to watch your diet and weight as you are doing - keep up the good work! 4)  Please schedule a follow-up appointment in 3-4 months, sooner if problems. Prescriptions: FUROSEMIDE 20 MG TABS (FUROSEMIDE) 1 by mouth once daily or as directed  #90 x 1   Entered and Authorized by:   Newt Lukes MD   Signed by:   Newt Lukes MD on 12/14/2009   Method used:   Faxed to ...       [facility]* (retail)             ,  Ph: [phone]       Fax:  [phone]   RxID:   [id number]

## 2010-07-25 NOTE — Letter (Signed)
Summary: Office Visit Letter  [facility]  [address] [place], [place] [address]   Phone: [phone]  Fax: [phone]      July 07, 2009 MRN: [id number]   Janet Le [address] [place], [place]  [address]   Dear [name],   According to our records, it is time for you to schedule a follow-up office visit with [place].   At your convenience, please call [phone] (option #2)to schedule an office visit. If you have any questions, concerns, or feel that this letter is in error, we would appreciate your call.   Sincerely,  Vania Rea. Jarold Motto, M.D.  [facility] [phone]

## 2010-07-25 NOTE — Progress Notes (Signed)
Summary: REFERRAL REQUEST  Phone Note Call from Patient   Summary of Call: Patient is requesting referral to lympadema center at [facility]. Fax # [phone], Please put req in for therapist Tammy.  Initial call taken by: Lamar Sprinkles, CMA,  April 14, 2010 12:12 PM  Follow-up for Phone Call        request ordered as listed - pcc will arrange Follow-up by: Newt Lukes MD,  April 14, 2010 1:30 PM

## 2010-07-25 NOTE — Procedures (Signed)
Summary: Colon   Colonoscopy  Procedure date:  07/10/2002  Findings:      Location:  [facility].    Colonoscopy  Procedure date:  07/10/2002  Findings:      Location:  [facility].   Patient Name: Janet Le, Anes MRN:  Procedure Procedures: Colonoscopy CPT: [phone].  Personnel: Endoscopist: Vania Rea. Jarold Motto, MD.  Exam Location: Exam performed in Outpatient Clinic. Outpatient  Patient Consent: Procedure, Alternatives, Risks and Benefits discussed, consent obtained, from patient. Consent was obtained by the RN.  Indications Symptoms: Constipation  History  Pre-Exam Physical: Performed Jul 10, 2002. Cardio-pulmonary exam, Rectal exam, Abdominal exam, Extremity exam, Mental status exam WNL.  Exam Exam: Extent of exam reached: Cecum, extent intended: Cecum.  The cecum was identified by appendiceal orifice and IC valve. Patient position: on left side. Duration of exam: 20 minutes. Colon retroflexion performed. Images taken. ASA Classification: I. Tolerance: excellent.  Monitoring: Pulse and BP monitoring, Oximetry used. Supplemental O2 given.  Colon Prep Used Golytely for colon prep. Prep results: excellent.  Sedation Meds: Patient assessed and found to be appropriate for moderate (conscious) sedation. Fentanyl 50 mcg. given IV. Versed 5 mg. given IV.  Instrument(s): CF 140L. Serial [id number].  Findings - NORMAL EXAM: Cecum to Rectum. Not Seen: Polyps. AVM's. Colitis. Tumors. Crohn's. Diverticulosis. Hemorrhoids.   Assessment Normal examination.  Events  Unplanned Interventions: No intervention was required.  Plans Medication Plan: Fiber supplements: Methylcellulose 1 tsp QAM, starting Jul 10, 2002 for indefinitely.  Zelnorm: 6mg . BID, starting Jul 10, 2002 for m.   Patient Education: Patient given standard instructions for: Constipation. Disposition: After procedure patient sent to recovery. After recovery patient sent  home.  Scheduling/Referral: Clinic Visit, to Vania Rea. Jarold Motto, MD, around Aug 10, 2002.    cc: Gershon Crane. MD

## 2010-07-25 NOTE — Progress Notes (Signed)
Summary: liodocaine cream  Phone Note Refill Request Message from:  Fax from Pharmacy on March 15, 2010 2:43 PM  Refills Requested: Medication #1:  Lidocaine 3% HC 0.5% cream Insert per rectum 2 times a day   Last Refilled: [facility]/2008 [facility]. Is this ok to renew?  Initial call taken by: Orlan Leavens RMA,  March 15, 2010 2:45 PM  Follow-up for Phone Call        ok to fill as prev rx'd - thx Follow-up by: Newt Lukes MD,  March 15, 2010 3:46 PM    New/Updated Medications: LIDOCAINE-HYDROCORTISONE ACE 3-0.5 % CREA (LIDOCAINE-HYDROCORTISONE ACE) apply two times a day to rectum Prescriptions: LIDOCAINE-HYDROCORTISONE ACE 3-0.5 % CREA (LIDOCAINE-HYDROCORTISONE ACE) apply two times a day to rectum  #1 x 1   Entered by:   Orlan Leavens RMA   Authorized by:   Newt Lukes MD   Signed by:   Orlan Leavens RMA on 03/15/2010   Method used:   [facility]        [facility]  [address] [phone]* (retail)       [address]       [place], [place]  [address]       Ph: [phone] or [phone]       Fax: [phone]   RxID:   [id number]

## 2010-07-25 NOTE — Assessment & Plan Note (Signed)
Summary: COUGH/ CONGESTION/ WANTS COUGH SYRUP/ NO FEVER/NWS  #   Vital Signs:  Patient profile:   55 year old female Height:      62 inches (157.48 cm) Weight:      233.8 pounds (106.27 kg) O2 Sat:      97 % on Room air Temp:     98.2 degrees F (36.78 degrees C) oral Pulse rate:   82 / minute BP sitting:   112 / 88  (right arm) Cuff size:   large  Vitals Entered By: Orlan Leavens (August 31, 2009 11:03 AM)  O2 Flow:  Room air CC: cough and chest congestion/ pt states she have'nt started antibiotic or cough syrup that was sent in yesterday Is Patient Diabetic? Yes Did you bring your meter with you today? No Pain Assessment Patient in pain? no        Primary Care Provider:  Newt Lukes MD  CC:  cough and chest congestion/ pt states she have'nt started antibiotic or cough syrup that was sent in yesterday.  History of Present Illness:  continued sinus problems -      This is a 55 year old woman who presents sinus congestion, nasal congestion and drainage.  The symptoms began 3+ weeks ago.  The severity is described as moderate-severe.  The patient reports associated dry cough due to drainage, but denies fever, sore throat, and earache.  Prior fever and "infection" improved after tx with Zpack last OV but still wheezing at night.  The patient denies vomiting and diarrhea.  The patient also reports requiring sinus sugery last year for similar symptoms unimproved with med tx.  The patient reports compliance with allergy medications and nasal spray.  Reports symptoms worse during the spring allergy season and weather changes  Current Medications (verified): 1)  Diovan Hct 320-25 Mg Tabs (Valsartan-Hydrochlorothiazide) .... Once Daily 2)  Omega-3 Complex 192-251-11 Mg-Mg-Unit Caps (Dha-Epa-Vitamin E) .... Take 2 Capsules By Mouth Daily 3)  Ibuprofen 800 Mg Tabs (Ibuprofen) .... Three Times A Day 4)  Vitamin E 400 Unit Caps (Vitamin E) .... Once Daily 5)  Polyethylene Glycol 3350   Powd (Polyethylene Glycol 3350) .... As Needed 6)  Dexilant 60 Mg Cpdr (Dexlansoprazole) .[name].. 1 By Mouth Q Am 7)  Loratadine 10 Mg Tabs (Loratadine) .[name].. 1 By Mouth Once Daily 8)  Fluticasone Propionate 50 Mcg/act Susp (Fluticasone Propionate) .[name].. 1 Spray Each Nostril  Every Morning 9)  Accu-Chek Compact Test Drum  Strp (Glucose Blood) .... Test 1-2 Times Daily As Needed Dx: 790.29 10)  Accu-Chek Compact Plus Care  Kit (Blood Glucose Monitoring Suppl) .... Glucometer Test 1-2 Times Per Day As Needed Dx: 790.29 11)  Calcium 600mg  T Vitamin D 1000mg  .... Take 1 Two Times A Day 12)  Promethazine Vc 6.25-5 Mg/77ml Syrp (Promethazine-Phenylephrine) .... 5 Cc By Mouth Every 4 Hours As Needed For Cough 13)  Benzonatate 100 Mg Caps (Benzonatate) .[name].. 1 By Mouth Three Times A Day As Needed For Cough 14)  Cinnamon .[name].. 1 Tsp Daily 15)  Zithromax 250 Mg Tabs (Azithromycin) .... 2 Tabs By Mouth Today Then 1 Tab By Mouth  Every Day For 5 Days 16)  Calcium-Vitamin D 7088296949 Mg-Unit Tabs (Calcium-Vitamin D) .... Take 4 Once Daily  Allergies (verified): 1)  ! Codeine 2)  ! Darvon  Past History:  Past medical, surgical, family and social histories (including risk factors) reviewed, and no changes noted (except as noted below).  Past Medical History: Reviewed history from  06/14/2009 and no changes required. 1. CONSTIPATION, CHRONIC (ICD-564.09) 2. GERD (ICD-530.81) with hiatal hernia.  3. ENDOMETRIOSIS (ICD-617.9) 4. HYPERTENSION (ICD-401.9) 5. Diabetes: diet-controlled.  6. Syncopal episodes since childhood. 7. Chronic low back pain. 8. Chronic sinusitis.   MD rooster- cards - [facility] ([facility])  Past Surgical History: Reviewed history from 06/14/2009 and no changes required. Hernia Surgery Bilateral Breast Surgery Bilateral Tubal Ligation Ablation Breast biopsy (4782 & 1995) sinus surg - 2007, 2010  Family History: Reviewed history  from 06/01/2009 and no changes required. Family History of Prostate Cancer:Father Family History of Colitis/Crohn'sBrother: Family History of Diabetes: Mother Family History of Kidney Disease:Father  No premature CAD  Social History: Reviewed history from 06/14/2009 and no changes required. 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  lives with 10yo g-son, separated from spouse Alcohol Use - no Illicit Drug Use - no  Review of Systems       The patient complains of prolonged cough.  The patient denies fever, vision loss, decreased hearing, chest pain, dyspnea on exertion, and headaches.         also see HPI above. I have reviewed all other systems and they were negative.   Physical Exam  General:  overweight-appearing.  alert, well-developed, well-nourished, and cooperative to examination.    Ears:  normal pinnae bilaterally, without erythema, swelling, or tenderness to palpation. TMs clear, without effusion, or cerumen impaction. Hearing grossly normal bilaterally  Mouth:  teeth and gums in good repair; mucous membranes moist, without lesions or ulcers. oropharynx clear without exudate, no erythema.  +PND Lungs:  few rhonchi bilaterally - otherwise, normal respiratory effort, no intercostal retractions or use of accessory muscles; normal breath sounds bilaterally - no crackles and no wheezes.    Heart:  normal rate, regular rhythm, no murmur, and no rub. BLE with chronic lymphedema, L>R. normal DP pulses and normal cap refill in all 4 extremities      Impression & Recommendations:  Problem # 1:  SINUSITIS, CHRONIC (ICD-473.9) no evidence for infx without fever - hold abx prior sinus surg hx reviewed  will re-refer now (pt desires second opinion from ENT) and max med tx of underlying allergic component tx night cough symptoms with cough suppressant The following medications were removed from the medication list:    Zithromax 250  Mg Tabs (Azithromycin) .[name]... 2 tabs by mouth today then 1 tab by mouth  every day for 5 days Her updated medication list for this problem includes:    Benzonatate 100 Mg Caps (Benzonatate) .[name]... 1 by mouth three times a day as needed for cough    Tussionex Pennkinetic Er 8-10 Mg/74ml Lqcr (Chlorpheniramine-hydrocodone) .Marland KitchenMarland KitchenMarland KitchenMarland Kitchen 5 cc by mouth every 12 hours as needed for cough    Fluticasone Propionate 50 Mcg/act Susp (Fluticasone propionate) .[name]... 1 spray each nostril  every morning  Orders: ENT Referral (ENT)  Problem # 2:  DIABETES MELLITUS, BORDERLINE (ICD-790.29)  dx per pt from prior PCP - will recheck labs now cont nutrition conseling and encouraged pt to inc phys activity along with diet changes in effort to lose weight and control her dz Time spent with patient 45 minutes, more than 50% of this time was spent counseling patient on diabetes and sinusitus and problems concerning the need for medication compliance as welll as weight loss and exercise for health mgmt  Labs Reviewed: Creat: 0.87 (04/25/2009)     Orders: TLB-A1C / Hgb A1C (Glycohemoglobin) (83036-A1C)  Problem # 3:  ALLERGIC RHINITIS (ICD-477.9)  Her updated medication list for this problem includes:    Fluticasone Propionate 50 Mcg/act Susp (Fluticasone propionate) .[name]... 1 spray each nostril  every morning    Loratadine 10 Mg Tabs (Loratadine) .[name]... 1 by mouth every evening    Fexofenadine Hcl 180 Mg Tabs (Fexofenadine hcl) .[name]... 1 by mouth  every morning  Orders: Prescription Created Electronically [phone])  Complete Medication List: 1)  Diovan Hct 320-25 Mg Tabs (Valsartan-hydrochlorothiazide) .... Once daily 2)  Omega-3 Complex 192-251-11 Mg-mg-unit Caps (Dha-epa-vitamin e) .... Take 2 capsules by mouth daily 3)  Ibuprofen 800 Mg Tabs (Ibuprofen) .... Three times a day 4)  Vitamin E 400 Unit Caps (Vitamin e) .... Once daily 5)  Polyethylene Glycol 3350 Powd (Polyethylene glycol 3350) .... As needed 6)  Dexilant  60 Mg Cpdr (Dexlansoprazole) .[name].. 1 by mouth q am 7)  Accu-chek Compact Test Drum Strp (Glucose blood) .... Test 1-2 times daily as needed dx: 790.29 8)  Accu-chek Compact Plus Care Kit (Blood glucose monitoring suppl) .... Glucometer test 1-2 times per day as needed dx: 790.29 9)  Calcium 600mg  T Vitamin D 1000mg   .... Take 1 two times a day 10)  Benzonatate 100 Mg Caps (Benzonatate) .[name].. 1 by mouth three times a day as needed for cough 11)  Cinnamon  .[name].. 1 tsp daily 12)  Calcium-vitamin D [phone] Mg-unit Tabs (calcium-vitamin D)  .... Take 4 once daily 13)  Tussionex Pennkinetic Er 8-10 Mg/23ml Lqcr (Chlorpheniramine-hydrocodone) .... 5 cc by mouth every 12 hours as needed for cough 14)  Fluticasone Propionate 50 Mcg/act Susp (Fluticasone propionate) .[name].. 1 spray each nostril  every morning 15)  Loratadine 10 Mg Tabs (Loratadine) .[name].. 1 by mouth every evening 16)  Fexofenadine Hcl 180 Mg Tabs (Fexofenadine hcl) .[name].. 1 by mouth  every morning  Patient Instructions: 1)  it was good to see you today. 2)  test(s) ordered today - your results will be posted on the phone tree for review in 48-72 hours from the time of test completion; call [phone] and enter your 9 digit MRN (listed above on this page, just below your name); if any changes need to be made or there are abnormal results, you will be contacted directly.  3)  increase allergy medication - generic allegra in AM AND generic claritin at bedtime - it is ok to use both in the same 24h period 4)  continue nasal steroid spray - 5)  new cough medication prescription provided - 6)  we'll make referral to Dr. Ezzard Standing with ENT for re-evaluation of sinus problems.  Our office will contact you regarding this appointment once made.  7)  Please keep follow-up appointment as prev scheduled (3 months), sooner if problems.  Prescriptions: LORATADINE 10 MG TABS (LORATADINE) 1 by mouth every evening  #30 x 5   Entered and Authorized by:   Newt Lukes MD   Signed by:   Newt Lukes MD on 08/31/2009   Method used:   [facility]        [facility] [address] [phone]* (retail)       [address]       [place], [place]  [address]       Ph: [phone] or [phone]       Fax: [phone]   RxID:   [id number] Sandria Senter ER 8-10 MG/5ML LQCR (CHLORPHENIRAMINE-HYDROCODONE) 5 cc by mouth every 12 hours as needed for  cough  #8 oz x 0   Entered and Authorized by:   Newt Lukes MD   Signed by:   Newt Lukes MD on 08/31/2009   Method used:   Print then Give to Patient   RxID:   [id number]

## 2010-07-25 NOTE — Procedures (Signed)
Summary: EGD   EGD  Procedure date:  07/10/2002  Findings:      Location: [facility]   Patient Name: Janet Le, Janet Le MRN:  Procedure Procedures: Panendoscopy (EGD) CPT: 43235.    with esophageal dilation. CPT: G9296129.  Personnel: Endoscopist: Vania Rea. Jarold Motto, MD.  Exam Location: Exam performed in Outpatient Clinic. Outpatient  Patient Consent: Procedure, Alternatives, Risks and Benefits discussed, consent obtained, from patient. Consent was obtained by the RN.  Indications Symptoms: Dysphagia. Reflux symptoms  History  Pre-Exam Physical: Performed Jul 10, 2002  Cardio-pulmonary exam, Abdominal exam, Extremity exam, Mental status exam WNL.  Exam Exam Info: Maximum depth of insertion Duodenum, intended Duodenum. Patient position: on left side. Duration of exam: 15 minutes. Vocal cords visualized. Gastric retroflexion performed. Images taken. ASA Classification: I. Tolerance: excellent.  Sedation Meds: Patient assessed and found to be appropriate for moderate (conscious) sedation. Cetacaine Spray 2 sprays given aerosolized. Versed 5 mg. given IV. Fentanyl 50 mcg. given IV.  Monitoring: BP and pulse monitoring done. Oximetry used. Supplemental O2 given at 2 Liters.  Instrument(s): GIF 140. Serial [id number].   Findings - OTHER FINDING: in Proximal Esophagus. 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  - HIATAL HERNIA: Prolapsing, 4 cms. in length. ICD9: Hernia, Hiatal: 553.3. - STRICTURE / STENOSIS: Distal Esophagus.  Constriction: partial. Etiology: benign due to reflux. Lumen diameter is 14 mm. ICD9: Esophageal Stricture: 530.3.  - Dilation: Distal Esophagus. for esophageal stricture. Maloney dilator used, Diameter: 17 mm, No Resistance, No Heme present on extraction. 1  total dilators used. Patient tolerance excellent. Outcome: successful.   Assessment  Diagnoses: 553.3: Hernia, Hiatal. Chronic GERD.  530.3:  Esophageal Stricture.   Events  Unplanned Intervention: No unplanned interventions were required.  Plans Medication(s): Continue current medications. PPI: Esomeprazole/Nexium 40 mg BID, starting Jul 10, 2002 for 4 wks.   Patient Education: Patient given standard instructions for: Reflux. Stenosis / Stricture. Soft diet for 24 hours.  Disposition: After procedure patient sent to recovery. After recovery patient sent home.  Scheduling: Clinic Visit, to Vania Rea. Jarold Motto, MD, around Aug 10, 2002.   cc: Gershon Crane. MD

## 2010-07-25 NOTE — Letter (Signed)
Summary: Post OP Visit/[facility]  Post OP Visit/[facility]   Imported By: Lester Wanship 05/13/2009 07:17:28  _____________________________________________________________________  External Attachment:    Type:   Image     Comment:   External Document

## 2010-07-25 NOTE — Letter (Signed)
Summary: [facility]  [facility]   Imported By: Lester Forestbrook 07/04/2009 08:12:40  _____________________________________________________________________  External Attachment:    Type:   Image     Comment:   External Document

## 2010-07-25 NOTE — Assessment & Plan Note (Signed)
Summary: couugh,cold/cd   Vital Signs:  Patient profile:   55 year old female Height:      62 inches (157.48 cm) Weight:      233.0 pounds (105.91 kg) O2 Sat:      97 % on Room air Temp:     97.6 degrees F (36.44 degrees C) oral Pulse rate:   82 / minute BP sitting:   122 / 76  (left arm) Cuff size:   large  Vitals Entered By: Orlan Leavens (August 22, 2009 9:54 AM)  O2 Flow:  Room air CC: Cold sxs, URI symptoms Is Patient Diabetic? No Pain Assessment Patient in pain? no        Primary Care Provider:  Newt Lukes MD  CC:  Cold sxs and URI symptoms.  History of Present Illness:  URI Symptoms      This is a 55 year old woman who presents with URI symptoms.  The symptoms began 3 days ago.  The severity is described as moderate.  The patient reports nasal congestion, dry cough, and sick contacts, but denies clear nasal discharge, purulent nasal discharge, sore throat, and earache.  Associated symptoms include low-grade fever (<100.5 degrees) and wheezing.  The patient denies vomiting and diarrhea.  The patient also reports itchy throat and severe fatigue.  The patient denies sneezing, seasonal symptoms, and headache.    Current Medications (verified): 1)  Diovan Hct 320-25 Mg Tabs (Valsartan-Hydrochlorothiazide) .... Once Daily 2)  Omega-3 Complex 192-251-11 Mg-Mg-Unit Caps (Dha-Epa-Vitamin E) .... Take 2 Capsules By Mouth Daily 3)  Emergen-C .... Take 1 Packet Daaily 4)  Ibuprofen 800 Mg Tabs (Ibuprofen) .... Three Times A Day 5)  Vitamin E 400 Unit Caps (Vitamin E) .... Once Daily 6)  Polyethylene Glycol 3350  Powd (Polyethylene Glycol 3350) .... As Needed 7)  Vitamin D3 1000 Unit Tabs (Cholecalciferol) .... Take 2 Tablets Daily 8)  Dexilant 60 Mg Cpdr (Dexlansoprazole) .[name].. 1 By Mouth Q Am 9)  Loratadine 10 Mg Tabs (Loratadine) .[name].. 1 By Mouth Once Daily 10)  Fluticasone Propionate 50 Mcg/act Susp (Fluticasone Propionate) .[name].. 1 Spray Each Nostril  Every Morning 11)   Accu-Chek Compact Test Drum  Strp (Glucose Blood) .... Test 1-2 Times Daily As Needed Dx: 790.29 12)  Accu-Chek Compact Plus Care  Kit (Blood Glucose Monitoring Suppl) .... Glucometer Test 1-2 Times Per Day As Needed Dx: 790.29 13)  Calcium 600mg  T Vitamin D 1000mg  .... Take 1 Two Times A Day  Allergies (verified): 1)  ! Codeine 2)  ! Darvon  Past History:  Past Medical History: Reviewed history from 06/14/2009 and no changes required. 1. CONSTIPATION, CHRONIC (ICD-564.09) 2. GERD (ICD-530.81) with hiatal hernia.  3. ENDOMETRIOSIS (ICD-617.9) 4. HYPERTENSION (ICD-401.9) 5. Diabetes: diet-controlled.  6. Syncopal episodes since childhood. 7. Chronic low back pain. 8. Chronic sinusitis.   MD rooster- cards - [facility]ie[facility] ([facility])  Review of Systems  The patient denies prolonged cough, headaches, hemoptysis, and abdominal pain.    Physical Exam  General:  overweight-appearing.  alert, well-developed, well-nourished, and cooperative to examination.    Eyes:  vision grossly intact; pupils equal, round and reactive to light.  conjunctiva and lids normal.    Ears:  normal pinnae bilaterally, without erythema, swelling, or tenderness to palpation. TMs clear, without effusion, or cerumen impaction. Hearing grossly normal bilaterally  Mouth:  teeth and gums in good repair; mucous membranes moist, without lesions or ulcers. oropharynx clear without exudate, no erythema.  +PND  Lungs:  few rhonchi bilaterally - otherwise, normal respiratory effort, no intercostal retractions or use of accessory muscles; normal breath sounds bilaterally - no crackles and no wheezes.    Heart:  normal rate, regular rhythm, no murmur, and no rub. BLE with chronic lymphedema, L>R. normal DP pulses and normal cap refill in all 4 extremities      Impression & Recommendations:  Problem # 1:  BRONCHITIS NOT SPECIFIED AS ACUTE OR CHRONIC (ICD-490)  Her updated  medication list for this problem includes:    Azithromycin 250 Mg Tabs (Azithromycin) .[name]... 2 tabs by mouth today, then 1 by mouth daily starting tomorrow    Promethazine Vc 6.25-5 Mg/81ml Syrp (Promethazine-phenylephrine) .Marland KitchenMarland KitchenMarland KitchenMarland Kitchen 5 cc by mouth every 4 hours as needed for cough    Benzonatate 100 Mg Caps (Benzonatate) .[name]... 1 by mouth three times a day as needed for cough  Take antibiotics and other medications as directed. Encouraged to push clear liquids, get enough rest, and take acetaminophen as needed. To be seen in 5-7 days if no improvement, sooner if worse.  Orders: Prescription Created Electronically [phone])  Problem # 2:  FOOT PAIN, LEFT (ICD-729.5)  Orders: Podiatry Referral (Podiatry)  Complete Medication List: 1)  Diovan Hct 320-25 Mg Tabs (Valsartan-hydrochlorothiazide) .... Once daily 2)  Omega-3 Complex 192-251-11 Mg-mg-unit Caps (Dha-epa-vitamin e) .... Take 2 capsules by mouth daily 3)  Emergen-c  .... Take 1 packet daaily 4)  Ibuprofen 800 Mg Tabs (Ibuprofen) .... Three times a day 5)  Vitamin E 400 Unit Caps (Vitamin e) .... Once daily 6)  Polyethylene Glycol 3350 Powd (Polyethylene glycol 3350) .... As needed 7)  Vitamin D3 1000 Unit Tabs (Cholecalciferol) .... Take 2 tablets daily 8)  Dexilant 60 Mg Cpdr (Dexlansoprazole) .[name].. 1 by mouth q am 9)  Loratadine 10 Mg Tabs (Loratadine) .[name].. 1 by mouth once daily 10)  Fluticasone Propionate 50 Mcg/act Susp (Fluticasone propionate) .[name].. 1 spray each nostril  every morning 11)  Accu-chek Compact Test Drum Strp (Glucose blood) .... Test 1-2 times daily as needed dx: 790.29 12)  Accu-chek Compact Plus Care Kit (Blood glucose monitoring suppl) .... Glucometer test 1-2 times per day as needed dx: 790.29 13)  Calcium 600mg  T Vitamin D 1000mg   .... Take 1 two times a day 14)  Azithromycin 250 Mg Tabs (Azithromycin) .... 2 tabs by mouth today, then 1 by mouth daily starting tomorrow 15)  Promethazine Vc 6.25-5 Mg/1ml Syrp  (Promethazine-phenylephrine) .... 5 cc by mouth every 4 hours as needed for cough 16)  Benzonatate 100 Mg Caps (Benzonatate) .[name].. 1 by mouth three times a day as needed for cough  Patient Instructions: 1)  it was good to see you today. 2)  antibiotics - Zpack - and ocugh medications - your prescriptions have been electronically submitted to your pharmacy. Please take as directed. Contact our office if you believe you're having problems with the medication(s).  3)  Get plenty of rest, drink lots of clear liquids, and use Tylenol or Ibuprofen for fever and comfort. Return in 7-10 days if you're not better:sooner if you're feeling worse. 4)  we'll make referral to podiatry for your foot pain symptoms . Our office will contact you regarding this appointment once made.  Prescriptions: BENZONATATE 100 MG CAPS (BENZONATATE) 1 by mouth three times a day as needed for cough  #30 x 0   Entered and Authorized by:   Newt Lukes MD   Signed by:   Newt Lukes MD on  08/22/2009   Method used:   [facility]        [facility] [address] [phone]* (retail)       [address]       [place], [place]  [address]       Ph: [phone] or [phone]       Fax: [phone]   RxID:   [id number] PROMETHAZINE VC 6.25-5 MG/5ML SYRP (PROMETHAZINE-PHENYLEPHRINE) 5 cc by mouth every 4 hours as needed for cough  #120cc x 0   Entered and Authorized by:   Newt Lukes MD   Signed by:   Newt Lukes MD on 08/22/2009   Method used:   [facility] to        [facility] [address] [phone]* (retail)       [address]       [place], [place]  [address]       Ph: [phone] or [phone]       Fax: [phone]   RxID:   [id number] AZITHROMYCIN 250 MG TABS (AZITHROMYCIN) 2 tabs by mouth today, then 1 by mouth daily starting tomorrow  #6 x 0   Entered and Authorized by:   Newt Lukes MD   Signed by:   Newt Lukes MD on  08/22/2009   Method used:   [facility]ically to        [facility] [address] [phone]* (retail)       [address]       [place], [place]  [address]       Ph: [phone] or [phone]       Fax: [phone]   RxID:   [id number]

## 2010-07-25 NOTE — Progress Notes (Signed)
Summary: Rx request  Phone Note Call from Patient Call back at Home Phone [phone]   Caller: Patient Summary of Call: pt called requesting Rx for lancets to [facility] Initial call taken by: Margaret Pyle, CMA,  Oct 31, 2009 2:54 PM    New/Updated Medications: BD ULTRA-FINE LANCETS  MISC (LANCETS) use as directed two times a day Prescriptions: BD ULTRA-FINE LANCETS  MISC (LANCETS) use as directed two times a day  #200 x 3   Entered by:   Margaret Pyle, CMA   Authorized by:   Newt Lukes MD   Signed by:   Margaret Pyle, CMA on 10/31/2009   Method used:   Electronically to        [facility]* (mail-order)             ,          Ph: [phone]       Fax: [phone]   RxID:   [id number]

## 2010-07-25 NOTE — Progress Notes (Signed)
  Phone Note Call from Patient Call back at Home Phone [phone]   Caller: Patient Call For: Dr Felicity Coyer Summary of Call: Pt requesting mammo results and results of cholesterol lab. Please advise. Initial call taken by: Verdell Face,  April 05, 2010 8:46 AM  Follow-up for Phone Call        called pt back to get more info gave pt labs results on 03/15/10. Pt states she meant to say when she normally get mammo done they go ahead and do bone density. pt states she had bone density done about 2 years ago. Look up in e;chart actuaaly done on 01/28/08, so pt is req order for bone density. also needing rx for furosemide sent to [facility] Follow-up by: Orlan Leavens RMA,  April 05, 2010 9:25 AM  Additional Follow-up for Phone Call Additional follow up Details #1::        order for bone density done - Newt Lukes MD  April 05, 2010 9:36 AM   New Problems: SPECIAL SCREENING FOR OSTEOPOROSIS (ICD-V82.81)   Additional Follow-up for Phone Call Additional follow up Details #2::    Forwarding msg to scheduler so they can set up pt for bone denisty. Follow-up by: Orlan Leavens RMA,  April 05, 2010 10:54 AM  Additional Follow-up for Phone Call Additional follow up Details #3:: Details for Additional Follow-up Action Taken: Bone density sched for 10/21 - pt aware. Additional Follow-up by: Verdell Face,  April 05, 2010 11:00 AM  New Problems: SPECIAL SCREENING FOR OSTEOPOROSIS (ICD-V82.81) Prescriptions: FUROSEMIDE 20 MG TABS (FUROSEMIDE) 2 by mouth every other day or as directed  #180 x 1   Entered by:   Orlan Leavens RMA   Authorized by:   Newt Lukes MD   Signed by:   Orlan Leavens RMA on 04/05/2010   Method used:   Faxed to ...       [facility] (mail-order)             , [place]         Ph: [phone]       Fax: [phone]   RxID:   [id number]

## 2010-07-25 NOTE — Letter (Signed)
Summary: [facility]   Imported By: Sherian Rein 04/27/2010 13:28:15  _____________________________________________________________________  External Attachment:    Type:   Image     Comment:   External Document

## 2010-07-25 NOTE — Progress Notes (Signed)
Summary: Rx change       New/Updated Medications: ACCU-CHEK SOFTCLIX LANCETS  MISC (LANCETS) use as directed two times a day Prescriptions: ACCU-CHEK SOFTCLIX LANCETS  MISC (LANCETS) use as directed two times a day  #200 x 3   Entered and Authorized by:   Margaret Pyle, CMA   Signed by:   Margaret Pyle, CMA on 11/03/2009   Method used:   Telephoned to ...       [facility] (mail-order)             ,          Ph: [phone]       Fax: [phone]   RxID:   [id number]

## 2010-07-25 NOTE — Assessment & Plan Note (Signed)
Summary: np6/ syncope. pt has atena/ gd   Referring Provider:  Dr. Jarold Motto Primary Provider:  Kellie Shropshire, MD  CC:  new patient. syncope.  Sleep Apnea.  History of Present Illness: 55 yo with long history of syncopal episodes presents for cardiology evaluation.  Janet Le has been passing out periodically since she was a child.  She always has a prodrome in which she feels a "rush of heat," then nausea, then lightheadedness.  She will either pass out or almost pass out.  She never feels palpitations or racing heart rate.  She never has chest pain.  Spells were frequent in childhood and much less frequent now (less than yearly).  She cannot think of anything that reproducibly brings them on.  Last episode was in 10/10.  She was at [facility] hospital visiting a critically ill cousin.  She walked in the cousin's room, saw her in bed hooked up to the ventilator and IVs, and then developed her prodrome.  She did not pass out completely but was able to sit down and eventually the feeling passed. She had an adenosine myoview a number of years ago for atypical chest pain (result was negative per her report), and she says that her prodrome feels very similar to getting an adenosine infusion. Other than the syncopal and presyncopal spells, she has no significant cardiopulmonary symptoms (no exertional chest pain or dyspnea).   ECG: NSR, left atrial enlargement  Current Medications (verified): 1)  Diovan Hct 320-25 Mg Tabs (Valsartan-Hydrochlorothiazide) .... Once Daily 2)  Omega-3 Complex 192-251-11 Mg-Mg-Unit Caps (Dha-Epa-Vitamin E) .... Take 2 Capsules By Mouth Daily 3)  Emergen-C .... Take 1 Packet Daaily 4)  Ibuprofen 800 Mg Tabs (Ibuprofen) .... Three Times A Day 5)  Vitamin E 400 Unit Caps (Vitamin E) .... Once Daily 6)  Polyethylene Glycol 3350  Powd (Polyethylene Glycol 3350) .... As Needed 7)  Vitamin D3 1000 Unit Tabs (Cholecalciferol) .... Take 2 Tablets Daily 8)  Dexilant 60 Mg Cpdr  (Dexlansoprazole) .[name].. 1 By Mouth Q Am 9)  Calcium 500 Mg Tabs (Calcium Carbonate) .... Take One Tab Once Daily 10)  Vitamin C Cr 500 Mg Cr-Caps (Ascorbic Acid) .... Take One Capsule Daily  Allergies (verified): 1)  ! Codeine 2)  ! Darvon  Past History:  Past Medical History: 1. CONSTIPATION, CHRONIC (ICD-564.09) 2. GERD (ICD-530.81) with hiatal hernia.  3. ENDOMETRIOSIS (ICD-617.9) 4. HYPERTENSION (ICD-401.9) 5. Diabetes: diet-controlled.  6. Syncopal episodes since childhood. 7. Chronic low back pain. 8. Chronic sinusitis.   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  Alcohol Use - no Illicit Drug Use - no  Review of Systems       All systems reviewed and negative except as per HPI.   Vital Signs:  Patient profile:   55 year old female Height:      62 inches Weight:      239 pounds BMI:     43.87 Pulse rate:   68 / minute Pulse rhythm:   regular BP sitting:   110 / 70  (right arm) Cuff size:   large  Vitals Entered By: Judithe Modest CMA (June 01, 2009 2:56 PM)  Physical Exam  General:  Well developed, well nourished, in no acute distress. obese.   Neck:  Neck supple, no JVD. No masses, thyromegaly  or abnormal cervical nodes. Lungs:  Clear bilaterally to auscultation and percussion. Heart:  Non-displaced PMI, chest non-tender; regular rate and rhythm, S1, S2 without murmurs, rubs or gallops. Carotid upstroke normal, no bruit. Pedals normal pulses. No edema, no varicosities. Abdomen:  Bowel sounds positive; abdomen soft and non-tender without masses, organomegaly, or hernias noted. No hepatosplenomegaly. Extremities:  No clubbing or cyanosis. Neurologic:  Alert and oriented x 3. Psych:  Normal affect.   Impression &  Recommendations:  Problem # 1:  SYNCOPE (ICD-780.2) Patient has infrequent episodes of syncope and presyncope (less than yearly now, more frequent in childhood).  She has a prodrome that occurs each time.  The episode in 10/10 seems to have been triggered by the shock of seeing a close relative sick in a hospital bed.  I think that these spells are probably vasovagal in nature.  Interestingly, she had similar symptoms with adenosine which triggers vasodilation similar to a vasovagal response.  Her ECG was not significantly abnormal and her exam was normal.  I will get an echocardiogram to make sure that her heart is structurally normal.  I also described to her physical counterpressure maneuvers to use when she feels the prodrome, which can potentially prevent syncope.  She should avoid prolonged standing. Unless she has LV systolic dysfunction, I do not think event monitoring would be helpful, especially as symptoms are so rare.   Other Orders: EKG w/ Interpretation (93000) Echocardiogram (Echo)  Patient Instructions: 1)  Your physician has requested that you have an echocardiogram.  Echocardiography is a painless test that uses sound waves to create images of your heart. It provides your doctor with information about the size and shape of your heart and how well your heart's chambers and valves are working.  This procedure takes approximately one hour. There are no restrictions for this procedure. 2)  Your physician recommends that you schedule a follow-up appointment as needed with Dr. Marca Ancona

## 2010-07-25 NOTE — Assessment & Plan Note (Signed)
Summary: FOOT PAIN/NWS   Vital Signs:  Patient profile:   55 year old female Height:      62 inches (157.48 cm) Weight:      232.12 pounds (105.51 kg) O2 Sat:      99 % on Room air Temp:     96.9 degrees F (36.06 degrees C) oral Pulse rate:   69 / minute BP sitting:   124 / 72  (left arm) Cuff size:   large  Vitals Entered By: Orlan Leavens (October 03, 2009 10:43 AM)  O2 Flow:  Room air CC: (L) foot pain Is Patient Diabetic? Yes Did you bring your meter with you today? No Pain Assessment Patient in pain? yes     Location: (L) foot Type: aching   Primary Care Provider:  Newt Lukes MD  CC:  (L) foot pain.  History of Present Illness: 1) here with cont pain left lateral foot -  describes as "burning" saw podiatry for same - no dx made "everything is fine" no njury - no new footware - no numbness into leg or medical side  2) obesity - working hard with meal replacment and exercise to lose weight -  3) seasonal allg - worse during sprong season such as past 2 weeks - prefers claritin to allegra for symptoms control but out of both at thsi time - uses nasal spray most days no fever or sinus pressure or pain - no HA or cough or ST  4) GERD - wants to review manometry results and ?need totake meds - reports indigestion better if taking ppi meds  5) HTN - reports compliance with ongoing medical treatment and no changes in medication dose or frequency. denies adverse side effects related to current therapy.   Current Medications (verified): 1)  Diovan Hct 320-25 Mg Tabs (Valsartan-Hydrochlorothiazide) .... Once Daily 2)  Omega-3 Complex 192-251-11 Mg-Mg-Unit Caps (Dha-Epa-Vitamin E) .... Take 2 Capsules By Mouth Daily 3)  Ibuprofen 800 Mg Tabs (Ibuprofen) .... Three Times A Day 4)  Vitamin E 400 Unit Caps (Vitamin E) .... Once Daily 5)  Polyethylene Glycol 3350  Powd (Polyethylene Glycol 3350) .... As Needed 6)  Dexilant 60 Mg Cpdr (Dexlansoprazole) .[name].. 1 By Mouth Q  Am 7)  Accu-Chek Compact Test Drum  Strp (Glucose Blood) .... Test 1-2 Times Daily As Needed Dx: 790.29 8)  Accu-Chek Compact Plus Care  Kit (Blood Glucose Monitoring Suppl) .... Glucometer Test 1-2 Times Per Day As Needed Dx: 790.29 9)  Calcium 600mg  T Vitamin D 1000mg  .... Take 1 Two Times A Day 10)  Benzonatate 100 Mg Caps (Benzonatate) .[name].. 1 By Mouth Three Times A Day As Needed For Cough 11)  Cinnamon .[name].. 1 Tsp Daily 12)  Calcium-Vitamin D [phone] Mg-Unit Tabs (Calcium-Vitamin D) .... Take 4 Once Daily 13)  Tussionex Pennkinetic Er 8-10 Mg/57ml Lqcr (Chlorpheniramine-Hydrocodone) .... 5 Cc By Mouth Every 12 Hours As Needed For Cough 14)  Fluticasone Propionate 50 Mcg/act Susp (Fluticasone Propionate) .[name].. 1 Spray Each Nostril  Every Morning 15)  Loratadine 10 Mg Tabs (Loratadine) .[name].. 1 By Mouth Every Evening 16)  Fexofenadine Hcl 180 Mg Tabs (Fexofenadine Hcl) .[name].. 1 By Mouth  Every Morning 17)  Energizing Soy Protein .... Once Q Am 18)  Vanilla Shake Mix .... Q Am 19)  Osteomatrix .... Take 1 Q Am  Allergies (verified): 1)  ! Codeine 2)  ! Darvon  Past History:  Past Medical History: 1. CONSTIPATION, CHRONIC (ICD-564.09) 2. GERD (ICD-530.81) with hiatal  hernia.  3. ENDOMETRIOSIS (ICD-617.9) 4. HYPERTENSION (ICD-401.9) 5. Diabetes: diet-controlled.  6. Syncopal episodes since childhood. 7. Chronic low back pain. 8. Chronic sinusitis.   MD rooster- cards - [facility])  Review of Systems  The patient denies fever, weight gain, chest pain, syncope, peripheral edema, headaches, and abdominal pain.    Physical Exam  General:  overweight-appearing.  alert, well-developed, well-nourished, and cooperative to examination.    Lungs:   normal respiratory effort, no intercostal retractions or use of accessory muscles; normal breath sounds bilaterally - no crackles and no wheezes.    Heart:  normal rate, regular rhythm,  no murmur, and no rub. BLE with chronic lymphedema, L>R. normal DP pulses and normal cap refill in all 4 extremities    Msk:  r knee: decreased range of motion, diffuse boggy synovitis. Tender to palpation on joint line. Increased pain with weight bearing. Positive crepitus. left foot with FROM and no lesions, NT to palp Neurologic:  alert & oriented X3 and cranial nerves II-XII symetrically intact.  strength normal in all extremities, sensation intact to light touch, and gait normal. speech fluent without dysarthria or aphasia; follows commands with good comprehension.    Impression & Recommendations:  Problem # 1:  FOOT PAIN, LEFT (ICD-729.5)  s/p podiatry eval - benign begin neuronitn for poss neuropathy symptoms - reassurance and new erx provided - cont exercsei and weight control efforts as ongoing  Orders: Prescription Created Electronically [phone])  Problem # 2:  ALLERGIC RHINITIS (ICD-477.9)  The following medications were removed from the medication list:    Fexofenadine Hcl 180 Mg Tabs (Fexofenadine hcl) .[name]... 1 by mouth  every morning Her updated medication list for this problem includes:    Fluticasone Propionate 50 Mcg/act Susp (Fluticasone propionate) .[name]... 1 spray each nostril  every morning    Loratadine 10 Mg Tabs (Loratadine) .[name]... 1 by mouth every evening  Discussed use of allergy medications and environmental measures.   Problem # 3:  HYPERTENSION (ICD-401.9)  Her updated medication list for this problem includes:    Diovan Hct 320-25 Mg Tabs (Valsartan-hydrochlorothiazide) ..... Once daily  BP today: 124/72 Prior BP: 112/88 (08/31/2009)  Labs Reviewed: K+: 3.9 (04/25/2009) Creat: : 0.87 (04/25/2009)   Chol: 203 (03/11/2009)   HDL: 59 (03/11/2009)   LDL: 13 (03/11/2009)   TG: 66 (03/11/2009)  Problem # 4:  SINUSITIS, CHRONIC (ICD-473.9) did not keep ent eval for opinion 08/2009 because believes symptoms have improved by elimination of soy from diet Her  updated medication list for this problem includes:    Benzonatate 100 Mg Caps (Benzonatate) .[name]... 1 by mouth three times a day as needed for cough    Tussionex Pennkinetic Er 8-10 Mg/72ml Lqcr (Chlorpheniramine-hydrocodone) .Marland KitchenMarland KitchenMarland KitchenMarland Kitchen 5 cc by mouth every 12 hours as needed for cough    Fluticasone Propionate 50 Mcg/act Susp (Fluticasone propionate) .[name]... 1 spray each nostril  every morning  Problem # 5:  GERD (ICD-530.81)  Her updated medication list for this problem includes:    Dexilant 60 Mg Cpdr (Dexlansoprazole) .[name]... 1 by mouth q am  EGD: Location: [facility]   (07/10/2002)  Problem # 6:  OBESITY (ICD-278.00)  motivated by desire to avoid DM - encouraged cont of same efforts Time spent with patient 25 minutes, more than 50% of this time was spent counseling patient on diet efforts for weight loss, llergy and inus symptoms and medications and plans for tx of foot symptoms with gabapentin trial  Ht:  62 (10/03/2009)   Wt: 232.12 (10/03/2009)   BMI: 42.84 (08/26/2009)  Complete Medication List: 1)  Diovan Hct 320-25 Mg Tabs (Valsartan-hydrochlorothiazide) .... Once daily 2)  Omega-3 Complex 192-251-11 Mg-mg-unit Caps (Dha-epa-vitamin e) .... Take 2 capsules by mouth daily 3)  Ibuprofen 800 Mg Tabs (Ibuprofen) .... Three times a day 4)  Vitamin E 400 Unit Caps (Vitamin e) .... Once daily 5)  Polyethylene Glycol 3350 Powd (Polyethylene glycol 3350) .... As needed 6)  Dexilant 60 Mg Cpdr (Dexlansoprazole) .[name].. 1 by mouth q am 7)  Accu-chek Compact Test Drum Strp (Glucose blood) .... Test 1-2 times daily as needed dx: 790.29 8)  Accu-chek Compact Plus Care Kit (Blood glucose monitoring suppl) .... Glucometer test 1-2 times per day as needed dx: 790.29 9)  Calcium 600mg  T Vitamin D 1000mg   .... Take 1 two times a day 10)  Benzonatate 100 Mg Caps (Benzonatate) .[name].. 1 by mouth three times a day as needed for cough 11)  Cinnamon  .[name].. 1 tsp by mouth two times a day 12)   Calcium-vitamin D [phone] Mg-unit Tabs (calcium-vitamin D)  .... Take 4 once daily 13)  Tussionex Pennkinetic Er 8-10 Mg/60ml Lqcr (Chlorpheniramine-hydrocodone) .... 5 cc by mouth every 12 hours as needed for cough 14)  Fluticasone Propionate 50 Mcg/act Susp (Fluticasone propionate) .[name].. 1 spray each nostril  every morning 15)  Loratadine 10 Mg Tabs (Loratadine) .[name].. 1 by mouth every evening 16)  Energizing Soy Protein  .... Once q am 17)  Vanilla Shake Mix  .... Q am 18)  Osteomatrix  .... Take 1 q am 19)  Gabapentin 300 Mg Caps (Gabapentin) .[name].. 1 by mouth at bedtime for foot pain  Patient Instructions: 1)  it was good to see you today. 2)  start new medication for your nerve pain in your foot - gabapentin - your prescriptions have been electronically submitted to your pharmacy. Please take as directed. Contact our office if you believe you're having problems with the medication(s).  3)  continue to watch your diet and weight as you are doing - keep up the good work! 4)  Please schedule a follow-up appointment in 3 months (or as scheduled previously), sooner if problems.  Prescriptions: GABAPENTIN 300 MG CAPS (GABAPENTIN) 1 by mouth at bedtime for foot pain  #30 x 3   Entered and Authorized by:   Newt Lukes MD   Signed by:   Newt Lukes MD on 10/03/2009   Method used:   Electronically to        [facility] [address] [phone]* (retail)       [address]       [place], [place]  [address]       Ph: [phone] or [phone]       Fax: [phone]   RxID:   [id number]

## 2010-07-25 NOTE — Progress Notes (Signed)
Summary: rx refill req  Phone Note Refill Request Message from:  Fax from Pharmacy on March 22, 2010 2:03 PM  Refills Requested: Medication #1:  POLYETHYLENE GLYCOL 3350  POWD as needed   Last Refilled: 02/01/2010  Method Requested: Electronic Initial call taken by: Brenton Grills MA,  March 22, 2010 2:03 PM    Prescriptions: POLYETHYLENE GLYCOL 3350  POWD (POLYETHYLENE GLYCOL 3350) as needed  #1 month x 1   Entered by:   Brenton Grills MA   Authorized by:   Newt Lukes MD   Signed by:   Brenton Grills MA on 03/22/2010   Method used:   [facility]        [facility] [address] [phone]* (retail)       [address]       [place], [place]  [address]       Ph: [phone] or [phone]       Fax: [phone]   RxID:   [id number]

## 2010-07-25 NOTE — Letter (Signed)
Summary: [facility]N for [facility]  CMN for [facility]   Imported By: Sherian Rein 07/21/2009 12:05:49  _____________________________________________________________________  External Attachment:    Type:   Image     Comment:   External Document

## 2010-07-25 NOTE — Assessment & Plan Note (Signed)
Summary: NEW/ [employer] /NWS  #   Vital Signs:  Patient profile:   55 year old female Height:      62 inches (157.48 cm) Weight:      235.8 pounds (107.18 kg) O2 Sat:      97 % on Room air Temp:     98.2 degrees F (36.78 degrees C) oral Pulse rate:   72 / minute BP sitting:   132 / 84  (right arm) Cuff size:   large  Vitals Entered By: Orlan Leavens (June 14, 2009 10:53 AM)  O2 Flow:  Room air CC: New patient Is Patient Diabetic? No Pain Assessment Patient in pain? no        Primary Care Provider:  Kellie Shropshire, MD  CC:  New patient.  History of Present Illness: new pt to me and our division - here to est care prev followed with dr. 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 denies adverse side effects related to current therapy. request samples  5) sinus congestion - follows with ENT for same and has had sinus sutrg x 2, last one this year feels allegra not working anymore  Preventive Screening-Counseling & Management  Alcohol-Tobacco     Alcohol drinks/day: <1     Smoking Status: quit > 6 months     Tobacco Counseling: to remain off tobacco products  Caffeine-Diet-Exercise     Caffeine use/day: 0     Diet Counseling: to improve diet; diet is suboptimal     Does Patient Exercise: no     Exercise Counseling: to improve exercise regimen     Depression Counseling: not indicated; screening negative for depression  Clinical Review Panels:  Prevention   Last Mammogram:  normal (12/23/2008)   Last Pap Smear:   normal (12/23/2008)   Last Colonoscopy:  Location:  [facility].  (07/10/2002)  Immunizations   Last Flu Vaccine:  Historical (03/25/2009)   Current Medications (verified): 1)  Diovan Hct 320-25 Mg Tabs (Valsartan-Hydrochlorothiazide) .... Once Daily 2)  Omega-3 Complex 192-251-11 Mg-Mg-Unit Caps (Dha-Epa-Vitamin E) .... Take 2 Capsules By Mouth Daily 3)  Emergen-C .... Take 1 Packet Daaily 4)  Ibuprofen 800 Mg Tabs (Ibuprofen) .... Three Times A Day 5)  Vitamin E 400 Unit Caps (Vitamin E) .... Once Daily 6)  Polyethylene Glycol 3350  Powd (Polyethylene Glycol 3350) .... As Needed 7)  Vitamin D3 1000 Unit Tabs (Cholecalciferol) .... Take 2 Tablets Daily 8)  Dexilant 60 Mg Cpdr (Dexlansoprazole) .[name].. 1 By Mouth Q Am 9)  Calcium 500 Mg Tabs (Calcium Carbonate) .... Take One Tab Once Daily 10)  Vitamin C Cr 500 Mg Cr-Caps (Ascorbic Acid) .... Take One Capsule Daily 11)  Fexofenadine Hcl 180 Mg Tabs (Fexofenadine Hcl) .... Take 1 By Mouth Once Daily Prn  Allergies (verified): 1)  ! Codeine 2)  ! 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  lives with 10yo g-son, separated from spouse Alcohol Use - no Illicit Drug Use - no  Past Medical History: 1. CONSTIPATION, CHRONIC (ICD-564.09) 2. GERD (ICD-530.81) with hiatal hernia.  3. ENDOMETRIOSIS (ICD-617.9) 4. HYPERTENSION (ICD-401.9) 5. Diabetes: diet-controlled.  6. Syncopal episodes since childhood. 7. Chronic low back pain. 8. Chronic sinusitis.   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  lives with 10yo g-son, separated from spouse Alcohol Use - no Illicit Drug Use - no Smoking Status:  quit > 6 months Caffeine use/day:  0 Does Patient Exercise:  no  Review of Systems       see HPI above. I have reviewed all other systems and they were negative.   Physical Exam  General:  overweight-appearing.  alert, well-developed, well-nourished, and cooperative to examination.    Eyes:  vision grossly intact; pupils equal, round and reactive to light.  conjunctiva and lids normal.    Ears:  normal pinnae bilaterally, without erythema, swelling, or tenderness to palpation. TMs clear, without effusion, or cerumen impaction. Hearing grossly normal bilaterally  Mouth:  teeth and gums in good repair; mucous membranes moist, without lesions or ulcers. oropharynx clear without exudate, no erythema.   Neck:  thick, supple, full ROM, no masses, no thyromegaly; no thyroid nodules or tenderness. no JVD or carotid bruits.   Lungs:  normal respiratory effort, no intercostal retractions or use of accessory muscles; normal breath sounds bilaterally - no crackles and no wheezes.    Heart:  normal rate, regular rhythm, no murmur, and no rub. BLE with chronic lymphedema, L>R. normal DP pulses and normal cap refill in all 4 extremities    Abdomen:  obese, soft, non-tender, normal bowel sounds, no distention; no masses and no appreciable hepatomegaly or splenomegaly.   Msk:  r knee: decreased range of motion, diffuse boggy synovitis. Tender to palpation on joint line. Increased pain with weight bearing. Positive crepitus.  Neurologic:  alert & oriented X3 and cranial nerves II-XII symetrically intact.  strength normal in all extremities, sensation intact to light touch, and  gait normal. speech fluent without dysarthria or aphasia; follows commands with good comprehension.  Skin:  no rashes, vesicles, ulcers, or erythema. No nodules or irregularity to palpation.  Psych:  Oriented X3, memory intact for recent and remote, normally interactive, good eye contact, not anxious appearing, not depressed appearing, and not agitated.      Impression & Recommendations:  Problem # 1:  SINUSITIS, CHRONIC (ICD-473.9)  s/p surg 2x in past for same including this year -  advised change of antihist and add nasal steroid - pt feels restricted by cost concerns - advised to cont PPi andn f/u with ENT as ongoing Her updated medication list for this problem includes:    Fluticasone Propionate 50 Mcg/act Susp (Fluticasone propionate) .[name]... 1 spray each nostril  every morning  Orders: Prescription Created Electronically [phone])  Problem # 2:  HYPERTENSION (ICD-401.9) pt reports she has been given samples of her meds by prior PCP - samples given as able but also explained pt needs to look into pharm asst program as we may  not always have samples - cards given for reduced co-pay and samples as requested send for records tor eview prior labs, workup etc Her updated medication list for this problem includes:    Diovan Hct 320-25 Mg Tabs (Valsartan-hydrochlorothiazide) ..... 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 Three times a day  Problem # 4:  DIABETES  MELLITUS, BORDERLINE (ICD-790.29) dx per pt from prior PCP - will review records/labs - cont nutrition conseling and encouraged pt to inc phys activity along with diet changes in effort to lose weight and control her dz Time spent with patient 45 minutes, more than 50% of this time was spent counseling patient on diabetes, arthritis and sinusitus and problems concerning the need for weight loss and exercise for health mgmt  Complete Medication List: 1)  Diovan Hct 320-25 Mg Tabs (Valsartan-hydrochlorothiazide) .... Once daily 2)  Omega-3 Complex 192-251-11 Mg-mg-unit Caps (Dha-epa-vitamin e) .... Take 2 capsules by mouth daily 3)  Emergen-c  .... Take 1 packet daaily 4)  Ibuprofen 800 Mg Tabs (Ibuprofen) .... Three times a day 5)  Vitamin E 400 Unit Caps (Vitamin e) .... Once daily 6)  Polyethylene Glycol 3350 Powd (Polyethylene glycol 3350) .... As needed 7)  Vitamin D3 1000 Unit Tabs (Cholecalciferol) .... Take 2 tablets daily 8)  Dexilant 60 Mg Cpdr (Dexlansoprazole) .[name].. 1 by mouth q am 9)  Calcium 500 Mg Tabs (Calcium carbonate) .... Take one tab once daily 10)  Vitamin C Cr 500 Mg Cr-caps (Ascorbic acid) .... Take one capsule daily 11)  Loratadine 10 Mg Tabs (Loratadine) .[name].. 1 by mouth once daily 12)  Fluticasone Propionate 50 Mcg/act Susp (Fluticasone propionate) .[name].. 1 spray each nostril  every morning 13)  One Touch Delica Lancets Misc (Lancets) .... Use as directed 14)  Onetouch Ultra Test Strp (Glucose blood) .... Use as directed  Patient Instructions: 1)  it was good to see you today.  2)  we'll send for records from dr. shelton's office - 3)  change in sinus medications as discussed - your prescriptions have been electronically submitted to your pharmacy. Please take as directed. Contact our office if you believe you're having problems with the medication(s).  4)  continue to work with your comp Copy as ongoing for knee and back pain issues 5)  samples  given today for DiovanHCT and can use the pharmaceutical savings card for your co-pay on future prescriptions when samples are not available 6)  Please schedule a follow-up appointment in 6 months, sooner if problems.  Prescriptions: ONETOUCH ULTRA TEST  STRP (GLUCOSE BLOOD) use as directed  #100 x 3   Entered by:   Orlan Leavens   Authorized by:   Newt Lukes MD   Signed by:   Newt Lukes MD on 06/14/2009   Method used:   [facility]ronically to        [facility] [address] [phone]* (retail)  [address]       [place], [place]  [address]       Ph: [phone] or [phone]       Fax: [phone]   RxID:   [id number] ONE TOUCH DELICA LANCETS  MISC (LANCETS) use as directed  #100 x 3   Entered by:   Orlan Leavens   Authorized by:   Newt Lukes MD   Signed by:   Newt Lukes MD on 06/14/2009   Method used:   [facility]        [facility] [phone]* (retail)       [address]       [place], [place]  [address]       Ph: [phone] or [phone]       Fax: [phone]   RxID:   [id number] FLUTICASONE PROPIONATE 50 MCG/ACT SUSP (FLUTICASONE PROPIONATE) 1 spray each nostril  every morning  #1 x 3   Entered and Authorized by:   Newt Lukes MD   Signed by:   Newt Lukes MD on 06/14/2009   Method used:   [facility]        [facility] [address] [phone]* (retail)       [address]       [place], [place]  [address]       Ph: [phone] or [phone]       Fax: [phone]   RxID:   [id number]    Immunization History:  Influenza Immunization History:    Influenza:  historical (03/25/2009)   Preventive Care Screening  Mammogram:    Date:  12/23/2008    Results:  normal   Pap Smear:    Date:  12/23/2008    Results:  normal

## 2010-07-25 NOTE — Progress Notes (Signed)
Summary: Monitor/Strips rx request  Phone Note Call from Patient Call back at Home Phone [phone]   Summary of Call: Patient called requesting prescription for 3 mo supply of strips and also a new prescription for her glucose monitor. (Accuchek Compact). Patient needs this so that she can get these items cheaper. Patient is aware we can fax it in directly to [facility] for her. Please advise. Initial call taken by: Lucious Groves,  July 01, 2009 3:39 PM  Follow-up for Phone Call        ok to do so - thanks Follow-up by: Newt Lukes MD,  July 01, 2009 4:01 PM  Additional Follow-up for Phone Call Additional follow up Details #1::        faxed. Additional Follow-up by: Lucious Groves,  July 01, 2009 5:00 PM    New/Updated Medications: ACCU-CHEK COMPACT TEST DRUM  STRP (GLUCOSE BLOOD) test 1-2 times daily as needed DX: 790.29 ACCU-CHEK COMPACT PLUS CARE  KIT (BLOOD GLUCOSE MONITORING SUPPL) Glucometer test 1-2 times per day as needed DX: 790.29 Prescriptions: ACCU-CHEK COMPACT PLUS CARE  KIT (BLOOD GLUCOSE MONITORING SUPPL) Glucometer test 1-2 times per day as needed DX: 790.29  #1 x 0   Entered by:   Lucious Groves   Authorized by:   Newt Lukes MD   Signed by:   Lucious Groves on 07/01/2009   Method used:   Faxed to ...       [facility]* (mail-order)             ,          Ph: [phone]       Fax: [phone]   RxID:   [id number] ACCU-CHEK COMPACT TEST DRUM  STRP (GLUCOSE BLOOD) test 1-2 times daily as needed DX: 790.29  #3 mth x 3   Entered by:   Lucious Groves   Authorized by:   Newt Lukes MD   Signed by:   Lucious Groves on 07/01/2009   Method used:   Faxed to ...       [facility]* (mail-order)             ,          Ph: [phone]       Fax: [phone]   RxID:   [id number]

## 2010-07-25 NOTE — Assessment & Plan Note (Signed)
Summary: 3-4 MTH FU  STC   Vital Signs:  Patient profile:   55 year old female Height:      62 inches (157.48 cm) Weight:      231.2 pounds (105.09 kg) O2 Sat:      97 % on Room air Temp:     98.5 degrees F (36.94 degrees C) oral Pulse rate:   68 / minute BP sitting:   112 / 82  (left arm) Cuff size:   large  Vitals Entered By: Orlan Leavens RMA (March 15, 2010 9:02 AM)  O2 Flow:  Room air CC: 3 month follow-up Is Patient Diabetic? Yes Did you bring your meter with you today? No Pain Assessment Patient in pain? no        Primary Care Provider:  Newt Lukes MD  CC:  3 month follow-up.  History of Present Illness: 1) left foot neuropathy  cont pain left lateral foot but intermittent and less severe -  describes as "burning" saw podiatry for same - no dx made: "everything is fine" no injury - no new footware - no numbness into leg or medial side  2) obesity - working hard with meal replacement and exercise to lose weight - feels clothes are fitting better - size 20, now 18 - down 4 lbs since 09/2009 - highest weight 03/2009 - 252#  3) seasonal allg - worse during spring season - prefers claritin to Careers adviser for symptoms control - uses nasal spray most days no fever or sinus pressure or pain - no HA or cough or ST  4) GERD - reports indigestion better if taking ppi meds - no pain or reflux or change in BM -100% med compliance  5) HTN - reports compliance with ongoing medical treatment and no changes in medication dose or frequency. denies adverse side effects related to current therapy. no increase in edema, no CP or HA  6) diet controlled DM - watching carbs and losing weight efforts as above - take cinnamon but no med rx - does not check home cbg regularly  7) OA - knee and back - still waiting on approval for surg in [place] - ?water aerobics therapy - also c/o tightness and "pulling" B achilles region upon initiation of standing and walking - symptoms resolve after  30sec of walking, tight but not painful - no ankle or heel pain - no popping or new activity reported  Clinical Review Panels:  Lipid Management   Cholesterol:  203 (03/11/2009)   LDL (bad choesterol):  131 (03/11/2009)   HDL (good cholesterol):  59 (03/11/2009)   Triglycerides:  66 (03/11/2009)  Diabetes Management   HgBA1C:  5.9 (08/31/2009)   Creatinine:  0.87 (04/25/2009)   Last Flu Vaccine:  Fluvax 3+ (03/15/2010)  Complete Metabolic Panel   Glucose:  82 (04/25/2009)   Sodium:  140 (04/25/2009)   Potassium:  3.9 (04/25/2009)   Chloride:  101 (04/25/2009)   CO2:  27 (04/25/2009)   BUN:  17 (04/25/2009)   Creatinine:  0.87 (04/25/2009)   Albumin:  4.1 (02/25/2009)   Total Protein:  7.3 (02/25/2009)   Calcium:  9.7 (02/25/2009)   Total Bili:  0.3 (02/25/2009)   Alk Phos:  85 (02/25/2009)   SGPT (ALT):  18 (02/25/2009)   SGOT (AST):  24 (02/25/2009)   Current Medications (verified): 1)  Diovan Hct 320-25 Mg Tabs (Valsartan-Hydrochlorothiazide) .... Once Daily 2)  Omega-3 Complex 192-251-11 Mg-Mg-Unit Caps (Dha-Epa-Vitamin E) .... Take 2 Capsules By Mouth Daily  3)  Ibuprofen 800 Mg Tabs (Ibuprofen) .... Three Times A Day 4)  Vitamin E 400 Unit Caps (Vitamin E) .... Once Daily 5)  Polyethylene Glycol 3350  Powd (Polyethylene Glycol 3350) .... As Needed 6)  Dexilant 60 Mg Cpdr (Dexlansoprazole) .[name].. 1 By Mouth Q Am 7)  Accu-Chek Compact Test Drum  Strp (Glucose Blood) .... Test 1-2 Times Daily As Needed Dx: 790.29 8)  Calcium 600mg  T Vitamin D 1000mg  .... Take 1 Two Times A Day 9)  Cinnamon .[name].. 1 Tsp By Mouth Two Times A Day 10)  Calcium-Vitamin D 7140877609 Mg-Unit Tabs (Calcium-Vitamin D) .... Take 4 Once Daily 11)  Fluticasone Propionate 50 Mcg/act Susp (Fluticasone Propionate) .[name].. 1 Spray Each Nostril  Every Morning 12)  Loratadine 10 Mg Tabs (Loratadine) .[name].. 1 By Mouth Every Evening 13)  Energizing Soy Protein .... Once Q Am 14)  Vanilla Shake Mix .... Q Am 15)   Osteomatrix .... Take 1 Q Am 16)  Gabapentin 300 Mg Caps (Gabapentin) .[name].. 1 By Mouth At Bedtime For Foot Pain 17)  Accu-Chek Softclix Lancets  Misc (Lancets) .... Use As Directed Two Times A Day 18)  Furosemide 20 Mg Tabs (Furosemide) .[name].. 1 By Mouth Once Daily or As Directed  Allergies (verified): 1)  ! Codeine 2)  ! Darvon  Past History:  Past Medical History: 1. CONSTIPATION, CHRONIC  2. GERD  with hiatal hernia 3. ENDOMETRIOSIS 4. HYPERTENSION 5. Diabetes: diet-controlled 6. Syncopal episodes since childhood 7. Osteoarthritis - right knee, low back -chronic pain 8. Chronic sinusitis  MD roster- cards - Shirlee Latch [facility]-Thie[facility] ([place]) gyn - carol curtis  Review of Systems  The patient denies fever, weight loss, chest pain, and headaches.    Physical Exam  General:  overweight-appearing.  alert, well-developed, well-nourished, and cooperative to examination.   g-son at side Lungs:   normal respiratory effort, no intercostal retractions or use of accessory muscles; normal breath sounds bilaterally - no crackles and no wheezes.    Heart:  normal rate, regular rhythm, no murmur, and no rub. BLE with chronic lymphedema, L>R.  Msk:  B achilles tendon FROM intact w/o pain or crepitation   Impression & Recommendations:  Problem # 1:  DIABETES MELLITUS, BORDERLINE (ICD-790.29)  Orders: TLB-A1C / Hgb A1C (Glycohemoglobin) (83036-A1C) TLB-Lipid Panel (80061-LIPID) TLB-BMP (Basic Metabolic Panel-BMET) (80048-METABOL)  dx per pt from prior PCP - diet controlled cont nutrition counseling and encouraged to maitain increased phys activity along with diet changes in effort to continue losing weight and control her dz  Labs Reviewed: Creat: 0.87 (04/25/2009)     Problem # 2:  LYMPHEDEMA (ICD-457.1)  inc furosemide - check lytes to monitor Cr/K/Na prev in PT for same - robin at [employer] - but unable to afford - encouraged cont  activity and weight loss efforts - ok for water aerobics   Orders: Prescription Created Electronically [phone])  Problem # 3:  OSTEOARTHRITIS, KNEE, RIGHT (ICD-715.96)  Her updated medication list for this problem includes:    Ibuprofen 800 Mg Tabs (Ibuprofen) .[name]... Three times a day    Meloxicam 15 Mg Tabs (Meloxicam) .[name]... 1 by mouth once daily x 7 days, then as needed for pain symptoms  adv DJD per pt report - undergoing steroid injections and consideration for synvisc type injections to prolong need for surg - rec cont mgmt and arranging of PT/water aerobics as per orhtho med ongoing in [place] - advised on need to control and lose weight to reduce  steress on joints - knees and back also tendonitis symptoms in B achilles - advised on exercise and therapy for stretching - use daily meloxicam - erx done ok for water aerobics though unsure this therapy is "approved" by insurance - paper rx provided  Orders: Prescription Created Electronically [phone])  Problem # 4:  HYPERTENSION (ICD-401.9)  Her updated medication list for this problem includes:    Diovan Hct 320-25 Mg Tabs (Valsartan-hydrochlorothiazide) ..... Once daily    Furosemide 20 Mg Tabs (Furosemide) .[name]... 2 by mouth every other day or as directed  Orders: TLB-Lipid Panel (80061-LIPID) TLB-BMP (Basic Metabolic Panel-BMET) (80048-METABOL)  increase as needed lasix for edema - worse in summer -  BP today: 112/82 Prior BP: 118/76 (12/14/2009)  Labs Reviewed: K+: 3.9 (04/25/2009) Creat: : 0.87 (04/25/2009)   Chol: 203 (03/11/2009)   HDL: 59 (03/11/2009)   LDL: 13 (03/11/2009)   TG: 66 (03/11/2009)  Problem # 5:  OBESITY (ICD-278.00)  motivated by desire to avoid DM - encouraged cont of same efforts  Ht: 62 (10/03/2009)   Wt: 232.12 (10/03/2009)   BMI: 42.84 (08/26/2009)  Ht: 62 (12/14/2009)   Wt: 228.8 (12/14/2009)     Ht: 62 (03/15/2010)   Wt: 231.2 (03/15/2010)   BMI: 42.84 (08/26/2009)  Complete Medication  List: 1)  Diovan Hct 320-25 Mg Tabs (Valsartan-hydrochlorothiazide) .... Once daily 2)  Omega-3 Complex 192-251-11 Mg-mg-unit Caps (Dha-epa-vitamin e) .... Take 2 capsules by mouth daily 3)  Ibuprofen 800 Mg Tabs (Ibuprofen) .... Three times a day 4)  Vitamin E 400 Unit Caps (Vitamin e) .... Once daily 5)  Polyethylene Glycol 3350 Powd (Polyethylene glycol 3350) .... As needed 6)  Dexilant 60 Mg Cpdr (Dexlansoprazole) .[name].. 1 by mouth q am 7)  Accu-chek Compact Test Drum Strp (Glucose blood) .... Test 1-2 times daily as needed dx: 790.29 8)  Calcium 600mg  T Vitamin D 1000mg   .... Take 1 two times a day 9)  Cinnamon  .[name].. 1 tsp by mouth two times a day 10)  Calcium-vitamin D [phone] Mg-unit Tabs (calcium-vitamin D)  .... Take 4 once daily 11)  Fluticasone Propionate 50 Mcg/act Susp (Fluticasone propionate) .[name].. 1 spray each nostril  every morning 12)  Loratadine 10 Mg Tabs (Loratadine) .[name].. 1 by mouth every evening 13)  Energizing Soy Protein  .... Once q am 14)  Vanilla Shake Mix  .... Q am 15)  Osteomatrix  .... Take 1 q am 16)  Gabapentin 300 Mg Caps (Gabapentin) .[name].. 1 by mouth at bedtime for foot pain 17)  Accu-chek Softclix Lancets Misc (Lancets) .... Use as directed two times a day 18)  Furosemide 20 Mg Tabs (Furosemide) .... 2 by mouth every other day or as directed 19)  Meloxicam 15 Mg Tabs (Meloxicam) .[name].. 1 by mouth once daily x 7 days, then as needed for pain symptoms  Other Orders: Admin 1st Vaccine (60454) Flu Vaccine 60yrs + (09811) Flu Vaccine Consent Questions     Do you have a history of severe allergic reactions to this vaccine? no    Any prior history of allergic reactions to egg and/or gelatin? no    Do you have a sensitivity to the preservative Thimersol? no    Do you have a past history of Guillan-Barre Syndrome? no    Do you currently have an acute febrile illness? no    Have you ever had a severe reaction to latex? no    Vaccine information given and explained to  patient? yes  Are you currently pregnant? no    Lot Number:[id number]   Exp Date:12/23/2010   Site Given  Left Deltoid IM  Patient Instructions: 1)  it was good to see you today. 2)  test(s) ordered today - your results will be posted on the phone tree for review in 48-72 hours from the time of test completion; call [phone] and enter your 9 digit MRN (listed above on this page, just below your name); if any changes need to be made or there are abnormal results, you will be contacted directly. 3)  use meloxicam once daily x 7 dasy then as needed for tendonitis pain  4)  also use furosemide for swelling as dicussed 5)  your prescriptions have been electronically submitted to your pharmacy. Please take as directed. Contact our office if you believe you're having problems with the medication(s).  6)  prescription for water aerobics - check with your insurance to see if this is approved 7)  Please schedule a follow-up appointment in 6 months to recheck diabetes and bllod pressure and weight check; call sooner if problems.  Prescriptions: MELOXICAM 15 MG TABS (MELOXICAM) 1 by mouth once daily x 7 days, then as needed for pain symptoms  #30 x 1   Entered and Authorized by:   Newt Lukes MD   Signed by:   Newt Lukes MD on 03/15/2010   Method used:   Electronically to        [facility] [address] [phone]* (retail)       [address]       [place], [place]  [address]       Ph: [phone] or [phone]       Fax: [phone]   RxID:   [id number] FUROSEMIDE 20 MG TABS (FUROSEMIDE) 2 by mouth every other day or as directed  #60 x 2   Entered and Authorized by:   Newt Lukes MD   Signed by:   Newt Lukes MD on 03/15/2010   Method used:   [facility]        [facility] [address] [phone]* (retail)       [address]       [place], [place]  [address]       Ph: [phone] or [phone]       Fax:  [phone]   RxID:   [id number]    .lbflu

## 2010-07-25 NOTE — Assessment & Plan Note (Signed)
Summary: f/u...as.   History of Present Illness Visit Type: Follow-up Visit Primary GI MD: Sheryn Bison MD FACP FAGA Primary Provider: Newt Lukes MD Requesting Provider: Amada Jupiter, MD Chief Complaint: f/u acid reflux, dexilant not working History of Present Illness:   55 year old African American female with multiple medical problems and various somatic complaints. She now is fixated on upper respiratory symptomatology unresponsive to azithromycin recently prescribed by Dr. Felicity Coyer. She complains mostly of a globus sensation in her throat unresponsive to daily Dexilant 60 mg for the last 2 months. Previous GI evaluation as a been unremarkable. She denies true reflux symptoms. There's no history of thyromegaly or known endocrine dysfunction. Her appetite is good and her weight is stable. She has had ENT evaluation by Dr. Pollyann Kennedy. She does complain of asthmatic bronchitis and recurrent shortness of breath of unexplained etiology.   GI Review of Systems    Reports acid reflux, dysphagia with liquids, and  heartburn.      Denies abdominal pain, belching, bloating, chest pain, dysphagia with solids, loss of appetite, nausea, vomiting, vomiting blood, weight loss, and  weight gain.        Denies anal fissure, black tarry stools, change in bowel habit, constipation, diarrhea, diverticulosis, fecal incontinence, heme positive stool, hemorrhoids, irritable bowel syndrome, jaundice, light color stool, liver problems, rectal bleeding, and  rectal pain.    Current Medications (verified): 1)  Diovan Hct 320-25 Mg Tabs (Valsartan-Hydrochlorothiazide) .... Once Daily 2)  Omega-3 Complex 192-251-11 Mg-Mg-Unit Caps (Dha-Epa-Vitamin E) .... Take 2 Capsules By Mouth Daily 3)  Emergen-C .... Take 1 Packet Daaily 4)  Ibuprofen 800 Mg Tabs (Ibuprofen) .... Three Times A Day 5)  Vitamin E 400 Unit Caps (Vitamin E) .... Once Daily 6)  Polyethylene Glycol 3350  Powd (Polyethylene Glycol 3350) .... As  Needed 7)  Vitamin D3 1000 Unit Tabs (Cholecalciferol) .... Take 2 Tablets Daily 8)  Dexilant 60 Mg Cpdr (Dexlansoprazole) .[name].. 1 By Mouth Q Am 9)  Loratadine 10 Mg Tabs (Loratadine) .[name].. 1 By Mouth Once Daily 10)  Fluticasone Propionate 50 Mcg/act Susp (Fluticasone Propionate) .[name].. 1 Spray Each Nostril  Every Morning 11)  Accu-Chek Compact Test Drum  Strp (Glucose Blood) .... Test 1-2 Times Daily As Needed Dx: 790.29 12)  Accu-Chek Compact Plus Care  Kit (Blood Glucose Monitoring Suppl) .... Glucometer Test 1-2 Times Per Day As Needed Dx: 790.29 13)  Calcium 600mg  T Vitamin D 1000mg  .... Take 1 Two Times A Day 14)  Azithromycin 250 Mg Tabs (Azithromycin) .... 2 Tabs By Mouth Today, Then 1 By Mouth Daily Starting Tomorrow 15)  Promethazine Vc 6.25-5 Mg/5ml Syrp (Promethazine-Phenylephrine) .... 5 Cc By Mouth Every 4 Hours As Needed For Cough 16)  Benzonatate 100 Mg Caps (Benzonatate) .[name].. 1 By Mouth Three Times A Day As Needed For Cough 17)  Cinnamon .[name].. 1 Tsp Daily  Allergies (verified): 1)  ! Codeine 2)  ! Darvon  Past History:  Past medical, surgical, family and social histories (including risk factors) reviewed for relevance to current acute and chronic problems.  Past Medical History: Reviewed history from 06/14/2009 and no changes required. 1. CONSTIPATION, CHRONIC (ICD-564.09) 2. GERD (ICD-530.81) with hiatal hernia.  3. ENDOMETRIOSIS (ICD-617.9) 4. HYPERTENSION (ICD-401.9) 5. Diabetes: diet-controlled.  6. Syncopal episodes since childhood. 7. Chronic low back pain. 8. Chronic sinusitis.   MD rooster- cards - Shirlee Latch [facility] ([place])  Past Surgical History: Reviewed history from 06/14/2009 and no changes required. Hernia Surgery Bilateral Breast Surgery  Bilateral Tubal Ligation Ablation Breast biopsy (1981 & 1995) sinus surg - 2007, 2010  Family History: Reviewed history from 06/01/2009 and no changes  required. Family History of Prostate Cancer:Father Family History of Colitis/Crohn'sBrother: Family History of Diabetes: Mother Family History of Kidney Disease:Father  No premature CAD  Social History: Reviewed history from 06/14/2009 and no changes required. 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  lives with 10yo g-son, separated from spouse Alcohol Use - no Illicit Drug Use - no  Review of Systems       The patient complains of allergy/sinus.  The patient denies anemia, anxiety-new, arthritis/joint pain, back pain, blood in urine, breast changes/lumps, change in vision, confusion, cough, coughing up blood, depression-new, fainting, fatigue, fever, headaches-new, hearing problems, heart murmur, heart rhythm changes, itching, menstrual pain, muscle pains/cramps, night sweats, nosebleeds, pregnancy symptoms, shortness of breath, skin rash, sleeping problems, sore throat, swelling of feet/legs, swollen lymph glands, thirst - excessive , urination - excessive , urination changes/pain, urine leakage, vision changes, and voice change.   General:  Complains of fatigue; denies fever, chills, sweats, anorexia, weakness, malaise, weight loss, and sleep disorder. CV:  Denies chest pains, angina, palpitations, syncope, dyspnea on exertion, orthopnea, PND, peripheral edema, and claudication. Resp:  Complains of dyspnea with exercise and wheezing; denies dyspnea at rest, cough, sputum, coughing up blood, and pleurisy. GI:  Denies difficulty swallowing, pain on swallowing, nausea, indigestion/heartburn, vomiting, vomiting blood, abdominal pain, jaundice, gas/bloating, diarrhea, constipation, change in bowel habits, bloody BM's, black BMs, and fecal incontinence. Neuro:  Complains of syncope; denies weakness, paralysis, abnormal sensation, seizures, tremors, vertigo, transient blindness, frequent falls, frequent headaches, difficulty walking, headache,  sciatica, radiculopathy other:, restless legs, memory loss, and confusion; Recurrent idiopathic syncopal episodes since childhood.[name] Psych:  Denies depression, anxiety, memory loss, suicidal ideation, hallucinations, paranoia, phobia, and confusion. Endo:  Denies cold intolerance, heat intolerance, polydipsia, polyphagia, polyuria, unusual weight change, and hirsutism. Allergy:  Complains of sneezing, hay fever, and recurrent infections; denies hives and rash.  Vital Signs:  Patient profile:   55 year old female Height:      62 inches Weight:      233.38 pounds BMI:     42.84 Pulse rate:   70 / minute Pulse rhythm:   regular BP sitting:   120 / 62  (right arm)  Vitals Entered By: Chales Abrahams CMA Duncan Dull) (August 26, 2009 9:43 AM)  Physical Exam  General:  Well developed, well nourished, no acute distress.healthy appearing.   Head:  Normocephalic and atraumatic. Eyes:  PERRLA, no icterus.exam deferred to patient's ophthalmologist.   Mouth:  No deformity or lesions, dentition normal. Neck:  Supple; no masses or thyromegaly. Lungs:  Clear throughout to auscultation. Heart:  Regular rate and rhythm; no murmurs, rubs,  or bruits. Abdomen:  Soft, nontender and nondistended. No masses, hepatosplenomegaly or hernias noted. Normal bowel sounds.obese.   Extremities:  No clubbing, cyanosis, edema or deformities noted. Neurologic:  Alert and  oriented x4;  grossly normal neurologically. Cervical Nodes:  No significant cervical adenopathy. Psych:  Alert and cooperative. Normal mood and affect.   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 Apparently her father has recently died and this is exacerbated this globus sensation. To complete her GI workup I have scheduled esophageal manometry and 24-hour pH probe testing off PPI therapy for 72 hours. In the interim, we  we'll continue  Dexilant 60 mg  q.a.m.  Problem # 2:  BRONCHITIS NOT SPECIFIED AS ACUTE OR CHRONIC (ICD-490) Assessment: Unchanged Would consider pulmonary referral for pulmonary function testing. Her pulmonary exam today is entirely normal.  Patient Instructions: 1)  You have been scheduled for monometry and Ph studies. 2)  Please continue current medications.  3)  The medication list was reviewed and reconciled.  All changed / newly prescribed medications were explained.  A complete medication list was provided to the patient / caregiver. 4)  Copy sent to : Dr. Brynda Peon and Dr. Rene Paci 5)  Avoid foods high in acid content ( tomatoes, citrus juices, spicy foods) . Avoid eating within 3 to 4 hours of lying down or before exercising. Do not over eat; try smaller more frequent meals. Elevate head of bed four inches when sleeping.   Appended Document: f/u...as. Booking number [phone]   Clinical Lists Changes  Orders: Added new Test order of Mano/24 hour pH Probe (Mano/24 pH) - Signed

## 2010-08-18 ENCOUNTER — Encounter: Payer: Self-pay | Admitting: Internal Medicine

## 2010-08-18 ENCOUNTER — Other Ambulatory Visit: Payer: Self-pay | Admitting: Internal Medicine

## 2010-08-18 ENCOUNTER — Telehealth: Payer: Self-pay | Admitting: Internal Medicine

## 2010-08-18 ENCOUNTER — Ambulatory Visit (INDEPENDENT_AMBULATORY_CARE_PROVIDER_SITE_OTHER): Payer: Managed Care, Other (non HMO) | Admitting: Internal Medicine

## 2010-08-18 DIAGNOSIS — I10 Essential (primary) hypertension: Secondary | ICD-10-CM

## 2010-08-18 DIAGNOSIS — R350 Frequency of micturition: Secondary | ICD-10-CM | POA: Insufficient documentation

## 2010-08-18 DIAGNOSIS — I890 Lymphedema, not elsewhere classified: Secondary | ICD-10-CM

## 2010-08-18 DIAGNOSIS — R7309 Other abnormal glucose: Secondary | ICD-10-CM

## 2010-08-18 LAB — CBC WITH DIFFERENTIAL/PLATELET
Basophils Absolute: 0 10*3/uL (ref 0.0–0.1)
Basophils Relative: 0.4 % (ref 0.0–3.0)
Eosinophils Absolute: 0.1 10*3/uL (ref 0.0–0.7)
Eosinophils Relative: 1.3 % (ref 0.0–5.0)
HCT: 37.1 % (ref 36.0–46.0)
Hemoglobin: 12.6 g/dL (ref 12.0–15.0)
Lymphocytes Relative: 28.6 % (ref 12.0–46.0)
Lymphs Abs: 2 10*3/uL (ref 0.7–4.0)
MCHC: 34.1 g/dL (ref 30.0–36.0)
MCV: 93.4 fl (ref 78.0–100.0)
Monocytes Absolute: 0.5 10*3/uL (ref 0.1–1.0)
Monocytes Relative: 7.1 % (ref 3.0–12.0)
Neutro Abs: 4.5 10*3/uL (ref 1.4–7.7)
Neutrophils Relative %: 62.6 % (ref 43.0–77.0)
Platelets: 145 10*3/uL — ABNORMAL LOW (ref 150.0–400.0)
RBC: 3.97 Mil/uL (ref 3.87–5.11)
RDW: 14.2 % (ref 11.5–14.6)
WBC: 7.2 10*3/uL (ref 4.5–10.5)

## 2010-08-18 LAB — HEPATIC FUNCTION PANEL
ALT: 14 U/L (ref 0–35)
AST: 21 U/L (ref 0–37)
Albumin: 3.9 g/dL (ref 3.5–5.2)
Alkaline Phosphatase: 84 U/L (ref 39–117)
Bilirubin, Direct: 0.1 mg/dL (ref 0.0–0.3)
Total Bilirubin: 0.2 mg/dL — ABNORMAL LOW (ref 0.3–1.2)
Total Protein: 7 g/dL (ref 6.0–8.3)

## 2010-08-18 LAB — URINALYSIS, ROUTINE W REFLEX MICROSCOPIC
Bilirubin Urine: NEGATIVE
Hgb urine dipstick: NEGATIVE
Ketones, ur: NEGATIVE
Nitrite: NEGATIVE
Specific Gravity, Urine: 1.015 (ref 1.000–1.030)
Total Protein, Urine: NEGATIVE
Urine Glucose: NEGATIVE
Urobilinogen, UA: 0.2 (ref 0.0–1.0)
pH: 6 (ref 5.0–8.0)

## 2010-08-18 LAB — BASIC METABOLIC PANEL
BUN: 16 mg/dL (ref 6–23)
CO2: 32 mEq/L (ref 19–32)
Calcium: 9.4 mg/dL (ref 8.4–10.5)
Chloride: 103 mEq/L (ref 96–112)
Creatinine, Ser: 0.9 mg/dL (ref 0.4–1.2)
GFR: 79.49 mL/min (ref >60.00)
Glucose, Bld: 81 mg/dL (ref 70–99)
Potassium: 3.4 mEq/L — ABNORMAL LOW (ref 3.5–5.1)
Sodium: 140 mEq/L (ref 135–145)

## 2010-08-18 LAB — TSH: TSH: 1.76 u[IU]/mL (ref 0.35–5.50)

## 2010-08-18 LAB — HEMOGLOBIN A1C: Hgb A1c MFr Bld: 5.9 % (ref 4.6–6.5)

## 2010-08-22 ENCOUNTER — Telehealth: Payer: Self-pay | Admitting: Internal Medicine

## 2010-08-22 NOTE — Assessment & Plan Note (Signed)
Summary: DR VL PT NO SLOT   URINATION PROBLEM  STC   Vital Signs:  Patient profile:   55 year old female Menstrual status:  postmenopausal Height:      62 inches (157.48 cm) Weight:      219.25 pounds (99.66 kg) BMI:     40.25 O2 Sat:      97 % on Room air Temp:     98.8 degrees F (37.11 degrees C) oral Pulse rate:   95 / minute Pulse rhythm:   regular Resp:     14 per minute BP sitting:   136 / 84  (left arm) Cuff size:   large  Vitals Entered By: Burnard Leigh CMA(AAMA) (August 18, 2010 10:55 AM)  Nutrition Counseling: Patient's BMI is greater than 25 and therefore counseled on weight management options.  O2 Flow:  Room air CC: Pt c/o Urination problems x4 days w/polyuria and persistant urge to void/sls, cma, Hypertension Management Is Patient Diabetic? Yes Did you bring your meter with you today? No Pain Assessment Patient in pain? no      Comments Pt has many requests on change of meds, usage of meds, new meds, request for med refills     Menstrual Status postmenopausal Last PAP Result normal   Primary Care Provider:  Newt Lukes MD  CC:  Pt c/o Urination problems x4 days w/polyuria and persistant urge to void/sls, cma, and Hypertension Management.  History of Present Illness: New to me she complains of a 5 day history of frequent urination.  Hypertension History:      She denies headache, chest pain, palpitations, dyspnea with exertion, orthopnea, PND, peripheral edema, visual symptoms, neurologic problems, syncope, and side effects from treatment.  She notes no problems with any antihypertensive medication side effects.        Positive major cardiovascular risk factors include female age 54 years old or older, diabetes, hyperlipidemia, and hypertension.  Negative major cardiovascular risk factors include negative family history for ischemic heart disease and non-tobacco-user status.        Further assessment for target organ damage reveals no history of  ASHD, cardiac end-organ damage (CHF/LVH), stroke/TIA, peripheral vascular disease, renal insufficiency, or hypertensive retinopathy.     Preventive Screening-Counseling & Management  Alcohol-Tobacco     Alcohol drinks/day: <1     Smoking Status: quit > 6 months     Tobacco Counseling: to remain off tobacco products      Drug Use:  no.    Clinical Review Panels:  Prevention   Last Mammogram:  ASSESSMENT: Negative - BI-RADS 1^MM DIGITAL SCREENING (03/24/2010)   Last Pap Smear:  normal (12/23/2008)   Last Colonoscopy:  Location:  [facility].  (07/10/2002)  Immunizations   Last Flu Vaccine:  Fluvax 3+ (03/15/2010)  Lipid Management   Cholesterol:  205 (03/15/2010)   LDL (bad choesterol):  131 (03/11/2009)   HDL (good cholesterol):  53.40 (03/15/2010)   Triglycerides:  66 (03/11/2009)  Diabetes Management   HgBA1C:  6.0 (03/15/2010)   Creatinine:  0.8 (03/15/2010)   Last Flu Vaccine:  Fluvax 3+ (03/15/2010)  Complete Metabolic Panel   Glucose:  91 (03/15/2010)   Sodium:  142 (03/15/2010)   Potassium:  4.0 (03/15/2010)   Chloride:  100 (03/15/2010)   CO2:  35 (03/15/2010)   BUN:  15 (03/15/2010)   Creatinine:  0.8 (03/15/2010)   Albumin:  4.1 (02/25/2009)   Total Protein:  7.3 (02/25/2009)   Calcium:  10.1 (03/15/2010)  Total Bili:  0.3 (02/25/2009)   Alk Phos:  85 (02/25/2009)   SGPT (ALT):  18 (02/25/2009)   SGOT (AST):  24 (02/25/2009)   Medications Prior to Update: 1)  Diovan Hct 320-25 Mg Tabs (Valsartan-Hydrochlorothiazide) .... Once Daily 2)  Omega-3 Complex 192-251-11 Mg-Mg-Unit Caps (Dha-Epa-Vitamin E) .... Take 2 Capsules By Mouth Daily 3)  Ibuprofen 800 Mg Tabs (Ibuprofen) .... Three Times A Day 4)  Vitamin E 400 Unit Caps (Vitamin E) .... Once Daily 5)  Polyethylene Glycol 3350  Powd (Polyethylene Glycol 3350) .... As Needed 6)  Dexilant 60 Mg Cpdr (Dexlansoprazole) .[name].. 1 By Mouth Q Am 7)  Accu-Chek Compact Test Drum  Strp (Glucose Blood)  .... Test 1-2 Times Daily As Needed Dx: 790.29 8)  Calcium 600mg  T Vitamin D 1000mg  .... Take 1 Two Times A Day 9)  Cinnamon .[name].. 1 Tsp By Mouth Two Times A Day 10)  Calcium-Vitamin D 6476015990 Mg-Unit Tabs (Calcium-Vitamin D) .... Take 4 Once Daily 11)  Fluticasone Propionate 50 Mcg/act Susp (Fluticasone Propionate) .[name].. 1 Spray Each Nostril  Every Morning 12)  Loratadine 10 Mg Tabs (Loratadine) .[name].. 1 By Mouth Every Evening 13)  Energizing Soy Protein .... Once Q Am 14)  Vanilla Shake Mix .... Q Am 15)  Osteomatrix .... Take 1 Q Am 16)  Gabapentin 300 Mg Caps (Gabapentin) .[name].. 1 By Mouth At Bedtime For Foot Pain 17)  Accu-Chek Softclix Lancets  Misc (Lancets) .... Use As Directed Two Times A Day 18)  Furosemide 20 Mg Tabs (Furosemide) .... 2 By Mouth Every Other Day or As Directed 19)  Meloxicam 15 Mg Tabs (Meloxicam) .[name].. 1 By Mouth Once Daily X 7 Days, Then As Needed For Pain Symptoms 20)  Lidocaine-Hydrocortisone Ace 3-0.5 % Crea (Lidocaine-Hydrocortisone Ace) .... Apply Two Times A Day To Rectum  Current Medications (verified): 1)  Diovan Hct 320-25 Mg Tabs (Valsartan-Hydrochlorothiazide) .... Once Daily 2)  Omega-3 Complex 192-251-11 Mg-Mg-Unit Caps (Dha-Epa-Vitamin E) .... Take 2 Capsules By Mouth Daily 3)  Ibuprofen 800 Mg Tabs (Ibuprofen) .... Three Times A Day 4)  Polyethylene Glycol 3350  Powd (Polyethylene Glycol 3350) .... As Needed 5)  Dexilant 60 Mg Cpdr (Dexlansoprazole) .[name].. 1 By Mouth Q Am 6)  Accu-Chek Compact Test Drum  Strp (Glucose Blood) .... Test 1-2 Times Daily As Needed Dx: 790.29 7)  Calcium-Vitamin D 6476015990 Mg-Unit Tabs (Calcium-Vitamin D) .... Take 4 Once Daily 8)  Fluticasone Propionate 50 Mcg/act Susp (Fluticasone Propionate) .[name].. 1 Spray Each Nostril  Every Morning 9)  Loratadine 10 Mg Tabs (Loratadine) .[name].. 1 By Mouth Every Evening 10)  Gabapentin 300 Mg Caps (Gabapentin) .[name].. 1 By Mouth At Bedtime For Foot Pain 11)  Accu-Chek Softclix Lancets  Misc  (Lancets) .... Use As Directed Two Times A Day 12)  Furosemide 20 Mg Tabs (Furosemide) .... 2 By Mouth Every Other Day or As Directed 13)  Meloxicam 15 Mg Tabs (Meloxicam) .... Take 1/2 Tab Two Times A Day With Food 14)  Lidocaine-Hydrocortisone Ace 3-0.5 % Crea (Lidocaine-Hydrocortisone Ace) .... Apply Two Times A Day To Rectum 15)  Diethylpropion Hcl Cr 75 Mg Xr24h-Tab (Diethylpropion Hcl) .... Take 1 Tab  Every Morning 16)  Sertraline Hcl 100 Mg Tabs (Sertraline Hcl) .... Take 1 Tab Once Daily 17)  Methylphenidate Hcl 36 Mg Cr-Tabs (Methylphenidate Hcl) .... Take 1 Tab  Every Morning  Allergies (verified): 1)  ! Codeine 2)  ! Darvon  Past History:  Past Medical History: Last updated: 03/15/2010 1. CONSTIPATION,  CHRONIC  2. GERD  with hiatal hernia 3. ENDOMETRIOSIS 4. HYPERTENSION 5. Diabetes: diet-controlled 6. Syncopal episodes since childhood 7. Osteoarthritis - right knee, low back -chronic pain 8. 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  lives with 10yo g-son, separated from spouse Alcohol Use - no Illicit Drug Use - no  Risk Factors: Alcohol Use: <1 (08/18/2010) Caffeine Use: 0 (12/14/2009) Exercise: no (12/14/2009)  Risk Factors: Smoking Status: quit > 6 months (08/18/2010)  Family History: Reviewed history from 06/01/2009 and no changes  required. Family History of Prostate Cancer:Father Family History of Colitis/Crohn'sBrother: Family History of Diabetes: Mother Family History of Kidney Disease:Father  No premature CAD  Social History: Reviewed history from 06/14/2009 and no changes required. 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  lives with 10yo g-son, separated from spouse Alcohol Use - no Illicit Drug Use - no  Review of Systems  The patient denies anorexia, fever, weight loss, weight gain, decreased hearing, hoarseness, chest pain, syncope, dyspnea on exertion, peripheral edema, prolonged cough, headaches, hemoptysis, abdominal pain, hematuria, suspicious skin lesions, difficulty walking, and depression.   GU:  Complains of urinary frequency; denies abnormal vaginal bleeding, decreased libido, discharge, dysuria, genital sores, hematuria, incontinence, nocturia, and urinary hesitancy. Endo:  Complains of polyuria; denies cold intolerance, excessive hunger, excessive thirst, excessive urination, heat intolerance, and weight change.  Physical Exam  General:  alert, well-developed, well-nourished, well-hydrated, appropriate dress, cooperative to examination, good hygiene, and overweight-appearing.   Head:  normocephalic, atraumatic, no abnormalities observed, and no abnormalities palpated.   Eyes:  vision grossly intact, pupils equal, pupils round, and pupils reactive to light.   Ears:  R ear normal and L ear normal.   Mouth:  Oral mucosa and oropharynx without lesions or exudates.  Teeth in good repair. Neck:  supple, full ROM, no masses, no thyromegaly, no thyroid nodules or tenderness, no JVD, no HJR, normal carotid upstroke, no carotid bruits, no cervical lymphadenopathy, and no neck tenderness.   Lungs:  normal respiratory effort, no intercostal retractions, no accessory muscle use, normal breath sounds, no dullness, no fremitus, no crackles, and no wheezes.     Heart:  normal rate, regular rhythm, no murmur, no gallop, no rub, and no JVD.   Abdomen:  soft, non-tender, normal bowel sounds, no distention, no masses, no guarding, no rigidity, no rebound tenderness, no abdominal hernia, no inguinal hernia, no hepatomegaly, and no splenomegaly.   Msk:  No deformity or scoliosis noted of thoracic or lumbar spine.   Pulses:  R and L carotid,radial,femoral,dorsalis pedis and posterior tibial pulses are full and equal bilaterally Extremities:  trace left pedal edema and trace right pedal edema.   Neurologic:  No cranial nerve deficits noted. Station and gait are normal. Plantar reflexes are down-going bilaterally. DTRs are symmetrical throughout. Sensory, motor and coordinative functions appear intact. Skin:  Intact without suspicious lesions or rashes Cervical Nodes:  No lymphadenopathy noted Axillary Nodes:  No palpable lymphadenopathy Psych:  Cognition and judgment appear intact. Alert  and cooperative with normal attention span and concentration. No apparent delusions, illusions, hallucinations   Impression & Recommendations:  Problem # 1:  URINARY FREQUENCY (ICD-788.41) Assessment New will look for secondary causes Orders: Venipuncture (28413) TLB-BMP (Basic Metabolic Panel-BMET) (80048-METABOL) TLB-CBC Platelet - w/Differential (85025-CBCD) TLB-Hepatic/Liver Function Pnl (80076-HEPATIC) TLB-TSH (Thyroid Stimulating Hormone) (84443-TSH) TLB-A1C / Hgb A1C (Glycohemoglobin) (83036-A1C) T-Urine Culture (Spectrum Order) ([id number]) TLB-Udip w/ Micro (81001-URINE)  Problem # 2:  LYMPHEDEMA (ICD-457.1) Assessment: Unchanged  Problem # 3:  DIABETES MELLITUS, BORDERLINE (ICD-790.29) Assessment: Deteriorated  Orders: Venipuncture (36644) TLB-BMP (Basic Metabolic Panel-BMET) (80048-METABOL) TLB-CBC Platelet - w/Differential (85025-CBCD) TLB-Hepatic/Liver Function Pnl (80076-HEPATIC) TLB-TSH (Thyroid Stimulating Hormone) (84443-TSH) TLB-A1C / Hgb  A1C (Glycohemoglobin) (83036-A1C) T-Urine Culture (Spectrum Order) [id number]) TLB-Udip w/ Micro (81001-URINE)  Labs Reviewed: Creat: 0.8 (03/15/2010)     Problem # 4:  HYPERTENSION (ICD-401.9) Assessment: Unchanged  Her updated medication list for this problem includes:    Diovan Hct 320-25 Mg Tabs (Valsartan-hydrochlorothiazide) ..... Once daily    Furosemide 20 Mg Tabs (Furosemide) .[name]... 2 by mouth every other day or as directed  Orders: Venipuncture (38756) TLB-BMP (Basic Metabolic Panel-BMET) (80048-METABOL) TLB-CBC Platelet - w/Differential (85025-CBCD) TLB-Hepatic/Liver Function Pnl (80076-HEPATIC) TLB-TSH (Thyroid Stimulating Hormone) (84443-TSH) TLB-A1C / Hgb A1C (Glycohemoglobin) (83036-A1C) T-Urine Culture (Spectrum Order) [phone]) TLB-Udip w/ Micro (81001-URINE)  BP today: 136/84 Prior BP: 112/82 (03/15/2010)  10 Yr Risk Heart Disease: 11 %  Labs Reviewed: K+: 4.0 (03/15/2010) Creat: : 0.8 (03/15/2010)   Chol: 205 (03/15/2010)   HDL: 53.40 (03/15/2010)   LDL: 13 (03/11/2009)   TG: 95.0 (03/15/2010)  Complete Medication List: 1)  Diovan Hct 320-25 Mg Tabs (Valsartan-hydrochlorothiazide) .... Once daily 2)  Omega-3 Complex 192-251-11 Mg-mg-unit Caps (Dha-epa-vitamin e) .... Take 2 capsules by mouth daily 3)  Ibuprofen 800 Mg Tabs (Ibuprofen) .... Three times a day 4)  Polyethylene Glycol 3350 Powd (Polyethylene glycol 3350) .... As needed 5)  Dexilant 60 Mg Cpdr (Dexlansoprazole) .[name].. 1 by mouth q am 6)  Accu-chek Compact Test Drum Strp (Glucose blood) .... Test 1-2 times daily as needed dx: 790.29 7)  Calcium-vitamin D [phone] Mg-unit Tabs (calcium-vitamin D)  .... Take 4 once daily 8)  Fluticasone Propionate 50 Mcg/act Susp (Fluticasone propionate) .[name].. 1 spray each nostril  every morning 9)  Loratadine 10 Mg Tabs (Loratadine) .[name].. 1 by mouth every evening 10)  Gabapentin 300 Mg Caps (Gabapentin) .[name].. 1 by mouth at bedtime for foot pain 11)  Accu-chek  Softclix Lancets Misc (Lancets) .... Use as directed two times a day 12)  Furosemide 20 Mg Tabs (Furosemide) .... 2 by mouth every other day or as directed 13)  Meloxicam 15 Mg Tabs (Meloxicam) .... Take 1/2 tab two times a day with food 14)  Lidocaine-hydrocortisone Ace 3-0.5 % Crea (Lidocaine-hydrocortisone ace) .... Apply two times a day to rectum 15)  Diethylpropion Hcl Cr 75 Mg Xr24h-tab (Diethylpropion hcl) .... Take 1 tab  every morning 16)  Sertraline Hcl 100 Mg Tabs (Sertraline hcl) .... Take 1 tab once daily 17)  Methylphenidate Hcl 36 Mg Cr-tabs (Methylphenidate hcl) .... Take 1 tab  every morning 18)  Mvi W/calcium, Mag, Zinc, D3  .... Two times a day 19)  Mvi W/fish Oil, Flax Seed, Borage Oil, Omega 3,6,9  .... Two times a day  Hypertension Assessment/Plan:      The patient's hypertensive risk group is category C: Target organ damage and/or diabetes.  Her calculated 10 year risk of coronary heart disease is 11 %.  Today's blood pressure  is 136/84.  Her blood pressure goal is < 140/90.  Patient Instructions: 1)  Please schedule a follow-up appointment in 2 weeks. 2)  It is important that you exercise regularly at least 20 minutes 5 times a week. If you develop chest pain, have severe difficulty breathing, or feel very tired , stop exercising immediately and seek medical attention. 3)  You need to lose weight. Consider a lower calorie diet and regular exercise.  4)  Check your blood sugars regularly. If your readings are usually above 200 or below 70 you should contact our office. 5)  It is important that your Diabetic A1c level is checked every 3 months. 6)  See your eye doctor yearly to check for diabetic eye damage. 7)  Check your feet each night for sore areas, calluses or signs of infection. 8)  Check your Blood Pressure regularly. If it is above 130/80: you should make an appointment.   Orders Added: 1)  Venipuncture [36415] 2)  TLB-BMP (Basic Metabolic Panel-BMET)  [80048-METABOL] 3)  TLB-CBC Platelet - w/Differential [85025-CBCD] 4)  TLB-Hepatic/Liver Function Pnl [80076-HEPATIC] 5)  TLB-TSH (Thyroid Stimulating Hormone) [84443-TSH] 6)  TLB-A1C / Hgb A1C (Glycohemoglobin) [83036-A1C] 7)  T-Urine Culture (Spectrum Order) [[id number]] 8)  TLB-Udip w/ Micro [81001-URINE] 9)  Est. Patient Level V [56213]

## 2010-08-22 NOTE — Progress Notes (Signed)
Summary: RF REQ  Phone Note From Other Clinic   Summary of Call: Pt seen in office today by Dr Yetta Barre for acute visit.Pt would like to request refills for Dexilant & Furosemide to [facility].Also [facility] Initial call taken by: Burnard Leigh [facility]),  August 18, 2010 11:07 AM  Follow-up for Phone Call        Sent furosemide & dexilant to [facility]. Is it ok for refill on cough syrup? Follow-up by: Orlan Leavens RMA,  August 18, 2010 11:15 AM  Additional Follow-up for Phone Call Additional follow up Details #1::        [facility]idocain cream not cough syrup to local [facility]- FYI Additional Follow-up by: Lamar Sprinkles, CMA,  August 18, 2010 11:42 AM    Additional Follow-up for Phone Call Additional follow up Details #2::    Notified pt med sent to pharm's Follow-up by: Orlan Leavens RMA,  August 18, 2010 11:58 AM  New/Updated Medications: * MVI W/CALCIUM, MAG, ZINC, D3 two times a day * MVI W/FISH OIL, FLAX SEED, BORAGE OIL, OMEGA 3,6,9 two times a day Prescriptions: LIDOCAINE-HYDROCORTISONE ACE 3-0.5 % CREA (LIDOCAINE-HYDROCORTISONE ACE) apply two times a day to rectum  #1 x 1   Entered by:   Orlan Leavens RMA   Authorized by:   Newt Lukes MD   Signed by:   Orlan Leavens RMA on 08/18/2010   Method used:   [facility]        [facility] [address] [phone]* (retail)       [address]       [place], [place]  [address]       Ph: [phone] or [phone]       Fax: [phone]   RxID:   [id number] FUROSEMIDE 20 MG TABS (FUROSEMIDE) 2 by mouth every other day or as directed  #180 x 1   Entered by:   Orlan Leavens RMA   Authorized by:   Newt Lukes MD   Signed by:   Orlan Leavens RMA on 08/18/2010   Method used:   Faxed to ...       [facility] (mail-order)             , [place]         Ph: [phone]       Fax: [phone]   RxID:   [id number] DEXILANT 60 MG CPDR (DEXLANSOPRAZOLE) 1 by mouth q am  #90 x 1   Entered by:   Orlan Leavens  RMA   Authorized by:   Newt Lukes MD   Signed by:   Orlan Leavens RMA on 08/18/2010   Method used:   Faxed to ...       [facility] (mail-order)             , [place]         Ph: [phone]       Fax: [phone]   RxID:   [id number]

## 2010-08-25 ENCOUNTER — Telehealth: Payer: Self-pay | Admitting: Internal Medicine

## 2010-08-28 ENCOUNTER — Ambulatory Visit (INDEPENDENT_AMBULATORY_CARE_PROVIDER_SITE_OTHER): Payer: Managed Care, Other (non HMO) | Admitting: Internal Medicine

## 2010-08-28 ENCOUNTER — Encounter: Payer: Self-pay | Admitting: Internal Medicine

## 2010-08-28 DIAGNOSIS — K644 Residual hemorrhoidal skin tags: Secondary | ICD-10-CM | POA: Insufficient documentation

## 2010-08-28 DIAGNOSIS — R350 Frequency of micturition: Secondary | ICD-10-CM

## 2010-08-28 DIAGNOSIS — E669 Obesity, unspecified: Secondary | ICD-10-CM

## 2010-08-28 DIAGNOSIS — R7309 Other abnormal glucose: Secondary | ICD-10-CM

## 2010-08-28 HISTORY — DX: Residual hemorrhoidal skin tags: K64.4

## 2010-08-29 ENCOUNTER — Encounter: Payer: Self-pay | Admitting: Internal Medicine

## 2010-08-31 NOTE — Progress Notes (Signed)
Summary: UTI sxs  Phone Note Call from Patient Call back at Home Phone [phone]   Caller: Patient--[phone] Call For: Dr Felicity Coyer Summary of Call: Pt states she did not get all her lab results back.  Initial call taken by: Verdell Face,  August 25, 2010 12:42 PM  Follow-up for Phone Call        Pt states that she left several messages today stating UTI sxs are worsening, frequency and pain. Pt is requesting ABX and is unset that she did not recieve a return call sooner.  RX okay per VAL, pt informed Follow-up by: Margaret Pyle, CMA,  August 25, 2010 5:00 PM    New/Updated Medications: CIPRO 500 MG TABS (CIPROFLOXACIN HCL) 1 tablet by mouth two times a day x 5 days Prescriptions: CIPRO 500 MG TABS (CIPROFLOXACIN HCL) 1 tablet by mouth two times a day x 5 days  #10 x 0   Entered by:   Margaret Pyle, CMA   Authorized by:   Newt Lukes MD   Signed by:   Margaret Pyle, CMA on 08/25/2010   Method used:   [facility]        [facility] [address] [phone]* (retail)       [address]       [place], [place]  [address]       Ph: [phone] or [phone]       Fax: [phone]   RxID:   [id number]

## 2010-08-31 NOTE — Progress Notes (Signed)
Summary: Lab results  Phone Note Call from Patient Call back at Home Phone [phone]   Caller: Patient Summary of Call: Pt called requesting lab results ordered by [facility]. Initial call taken by: Margaret Pyle, CMA,  August 22, 2010 4:38 PM  Follow-up for Phone Call        potassium level is a little low, blood sugars are slightly elevated, urine looks a little infected, awaiting a urine culture result Follow-up by: Etta Grandchild MD,  August 22, 2010 5:50 PM  Additional Follow-up for Phone Call Additional follow up Details #1::        called Pt's phone and voicemail box is full.will try again at a later time. Additional Follow-up by: Burnard Leigh [facility]),  August 23, 2010 11:52 AM    Additional Follow-up for Phone Call Additional follow up Details #2::    Pt informed Follow-up by: Burnard Leigh [facility]),  August 23, 2010 11:55 AM

## 2010-09-05 NOTE — Assessment & Plan Note (Signed)
Summary: 2 wk fu  stc   Vital Signs:  Patient profile:   55 year old female Menstrual status:  postmenopausal Weight:      215.8 pounds (98.09 kg) O2 Sat:      97 % on Room air Temp:     98.8 degrees F (37.11 degrees C) oral Pulse rate:   97 / minute BP sitting:   118 / 78  (left arm) Cuff size:   large  Vitals Entered By: Orlan Leavens RMA (August 28, 2010 9:55 AM)  O2 Flow:  Room air CC: 2 week follow-up Is Patient Diabetic? Yes Did you bring your meter with you today? No Pain Assessment Patient in pain? no      Comments Pt is requesting results back from lab & urine cx that was done week ago by Dr. Yetta Barre. Also req refill on Diethylphenidate, and cream for hemorrhoids   Primary Care Provider:  Newt Lukes MD  CC:  2 week follow-up.  History of Present Illness: seen 2 weeks ago for UTI symptoms  rx'd cipro 3 days ago but has not yet begun abx progressive abd and suprapubic cramping with freq small vol urination no fever, no itch, no d/c and no flank pain - no hematuria  c/o flare of hem pain - bleeding and itch with prolapse chronic hx same, never with surg intervention - request med cream refill and GI f/u  also reviewed chronic med issues: obesity - working hard with meal replacement and exercise to lose weight - feels clothes are fitting better - originall size 20 - down 19 lbs since 09/2009 - highest weight 03/2009 - 252# - started diethylpropion 06/2010 for same and requests refill  seasonal allg - worse during spring season - prefers claritin to Careers adviser for symptoms control - uses nasal spray most days no fever or sinus pressure or pain - no HA or cough or ST  GERD - reports indigestion better if taking ppi meds - no pain or reflux or change in BM -100% med compliance  HTN - reports compliance with ongoing medical treatment and no changes in medication dose or frequency. denies adverse side effects related to current therapy. no increase in edema, no CP or  HA  diet controlled DM - watching carbs and losing weight efforts as above - take cinnamon but no med rx - does not check home cbg regularly  OA - knee and back - still waiting on approval for surg in [place] - ?water aerobics therapy - also c/o tightness and "pulling" B achilles region upon initiation of standing and walking - symptoms resolve after 30sec of walking, tight but not painful - no ankle or heel pain - no popping or new activity reported   Clinical Review Panels:  Diabetes Management   HgBA1C:  5.9 (08/18/2010)   Creatinine:  0.9 (08/18/2010)   Last Flu Vaccine:  Fluvax 3+ (03/15/2010)  CBC   WBC:  7.2 (08/18/2010)   RBC:  3.97 (08/18/2010)   Hgb:  12.6 (08/18/2010)   Hct:  37.1 (08/18/2010)   Platelets:  145.0 (08/18/2010)   MCV  93.4 (08/18/2010)   MCHC  34.1 (08/18/2010)   RDW  14.2 (08/18/2010)   PMN:  62.6 (08/18/2010)   Lymphs:  28.6 (08/18/2010)   Monos:  7.1 (08/18/2010)   Eosinophils:  1.3 (08/18/2010)   Basophil:  0.4 (08/18/2010)  Complete Metabolic Panel   Glucose:  81 (08/18/2010)   Sodium:  140 (08/18/2010)   Potassium:  3.4 (  08/18/2010)   Chloride:  103 (08/18/2010)   CO2:  32 (08/18/2010)   BUN:  16 (08/18/2010)   Creatinine:  0.9 (08/18/2010)   Albumin:  3.9 (08/18/2010)   Total Protein:  7.0 (08/18/2010)   Calcium:  9.4 (08/18/2010)   Total Bili:  0.2 (08/18/2010)   Alk Phos:  84 (08/18/2010)   SGPT (ALT):  14 (08/18/2010)   SGOT (AST):  21 (08/18/2010)   Current Medications (verified): 1)  Diovan Hct 320-25 Mg Tabs (Valsartan-Hydrochlorothiazide) .... Once Daily 2)  Omega-3 Complex 192-251-11 Mg-Mg-Unit Caps (Dha-Epa-Vitamin E) .... Take 2 Capsules By Mouth Daily 3)  Ibuprofen 800 Mg Tabs (Ibuprofen) .... Three Times A Day 4)  Polyethylene Glycol 3350  Powd (Polyethylene Glycol 3350) .... As Needed 5)  Dexilant 60 Mg Cpdr (Dexlansoprazole) .[name].. 1 By Mouth Q Am 6)  Accu-Chek Compact Test Drum  Strp (Glucose Blood) .... Test 1-2 Times Daily  As Needed Dx: 790.29 7)  Calcium-Vitamin D [phone] Mg-Unit Tabs (Calcium-Vitamin D) .... Take 4 Once Daily 8)  Fluticasone Propionate 50 Mcg/act Susp (Fluticasone Propionate) .[name].. 1 Spray Each Nostril  Every Morning 9)  Loratadine 10 Mg Tabs (Loratadine) .[name].. 1 By Mouth Every Evening 10)  Gabapentin 300 Mg Caps (Gabapentin) .[name].. 1 By Mouth At Bedtime For Foot Pain 11)  Accu-Chek Softclix Lancets  Misc (Lancets) .... Use As Directed Two Times A Day 12)  Furosemide 20 Mg Tabs (Furosemide) .... 2 By Mouth Every Other Day or As Directed 13)  Meloxicam 15 Mg Tabs (Meloxicam) .... Take 1/2 Tab Two Times A Day With Food 14)  Lidocaine-Hydrocortisone Ace 3-0.5 % Crea (Lidocaine-Hydrocortisone Ace) .... Apply Two Times A Day To Rectum 15)  Diethylpropion Hcl Cr 75 Mg Xr24h-Tab (Diethylpropion Hcl) .... Take 1 Tab  Every Morning 16)  Sertraline Hcl 100 Mg Tabs (Sertraline Hcl) .... Take 1 Tab Once Daily 17)  Methylphenidate Hcl 36 Mg Cr-Tabs (Methylphenidate Hcl) .... Take 1 Tab  Every Morning 18)  Mvi W/calcium, Mag, Zinc, D3 .... Two Times A Day 19)  Mvi W/fish Oil, Flax Seed, Borage Oil, Omega 3,6,9 .... Two Times A Day 20)  Cipro 500 Mg Tabs (Ciprofloxacin Hcl) .[name].. 1 Tablet By Mouth Two Times A Day X 5 Days  Allergies (verified): 1)  ! Codeine 2)  ! Darvon  Past History:  Past Medical History: 1. CONSTIPATION, CHRONIC  2. GERD with hiatal hernia 3. ENDOMETRIOSIS 4. HYPERTENSION 5. Diabetes: diet-controlled 6. Syncopal episodes since childhood 7. Osteoarthritis - right knee, low back -chronic pain 8. Chronic sinusitis  MD roster- cards - Shirlee Latch  GI- Jarold Motto ENT-[facility] ([place]) gyn - carol curtis  Review of Systems  The patient denies anorexia, fever, weight gain, chest pain, syncope, and headaches.    Physical Exam  General:  alert, well-developed, well-nourished, and cooperative to examination.    Lungs:  normal respiratory effort, no  intercostal retractions or use of accessory muscles; normal breath sounds bilaterally - no crackles and no wheezes.    Heart:  normal rate, regular rhythm, no murmur, and no rub. BLE without edema. Abdomen:  soft, non-tender, normal bowel sounds, no distention; no masses and no appreciable hepatomegaly or splenomegaly.   Rectal:  pt declines today   Impression & Recommendations:  Problem # 1:  URINARY FREQUENCY (ICD-788.41)  Ucx 08/18/10 not on records - send again now and encouraged to emperically start cipro as rx'd 3/2 Orders: T-Urine Culture (Spectrum Order) ([id number])  Discussed use of medication.   Problem #  2:  OBESITY (ICD-278.00)  motivated by desire to avoid DM - encouraged cont of same efforts refill on diethylpropion provided today -  max use 9 mo (to sept 2012) and needs weight in OV to review in 3 mo, sooner if probs  Ht: 62 (10/03/2009)   Wt: 232.12 (10/03/2009)   BMI: 42.84 (08/26/2009)  Ht: 62 (12/14/2009)   Wt: 228.8 (12/14/2009)     Ht: 62 (03/15/2010)   Wt: 231.2 (03/15/2010)   BMI: 42.84 (08/26/2009)  Ht: 62 (08/18/2010)   Wt: 215.8 (08/28/2010)   BMI: 40.25 (08/18/2010)  Problem # 3:  HEMORRHOIDS, EXTERNAL (ICD-455.3)  pt declines eval by me today ("they are same as always") refer to GI and consider surg intevention if cont complications  Orders: Gastroenterology Referral (GI)  Problem # 4:  DIABETES MELLITUS, BORDERLINE (ICD-790.29)  a1c now <6 with weight loss - cont diet control Labs Reviewed: Creat: 0.9 (08/18/2010)     Orders: Ophthalmology Referral (Ophthalmology)  Complete Medication List: 1)  Diovan Hct 320-25 Mg Tabs (Valsartan-hydrochlorothiazide) .... Once daily 2)  Omega-3 Complex 192-251-11 Mg-mg-unit Caps (Dha-epa-vitamin e) .... Take 2 capsules by mouth daily 3)  Ibuprofen 800 Mg Tabs (Ibuprofen) .... Three times a day 4)  Polyethylene Glycol 3350 Powd (Polyethylene glycol 3350) .... As needed 5)  Dexilant 60 Mg Cpdr  (Dexlansoprazole) .[name].. 1 by mouth q am 6)  Accu-chek Compact Test Drum Strp (Glucose blood) .... Test 1-2 times daily as needed dx: 790.29 7)  Calcium-vitamin D [phone] Mg-unit Tabs (calcium-vitamin D)  .... Take 4 once daily 8)  Fluticasone Propionate 50 Mcg/act Susp (Fluticasone propionate) .[name].. 1 spray each nostril  every morning 9)  Loratadine 10 Mg Tabs (Loratadine) .[name].. 1 by mouth every evening 10)  Gabapentin 300 Mg Caps (Gabapentin) .[name].. 1 by mouth at bedtime for foot pain 11)  Accu-chek Softclix Lancets Misc (Lancets) .... Use as directed two times a day 12)  Furosemide 20 Mg Tabs (Furosemide) .... 2 by mouth every other day or as directed 13)  Meloxicam 15 Mg Tabs (Meloxicam) .... Take 1/2 tab two times a day with food 14)  Lidocaine-hydrocortisone Ace 3-0.5 % Crea (Lidocaine-hydrocortisone ace) .... Apply two times a day to rectum 15)  Diethylpropion Hcl Cr 75 Mg Xr24h-tab (Diethylpropion hcl) .... Take 1 tab  every morning 16)  Sertraline Hcl 100 Mg Tabs (Sertraline hcl) .... Take 1 tab once daily 17)  Methylphenidate Hcl 36 Mg Cr-tabs (Methylphenidate hcl) .... Take 1 tab  every morning 18)  Mvi W/calcium, Mag, Zinc, D3  .... Two times a day 19)  Mvi W/fish Oil, Flax Seed, Borage Oil, Omega 3,6,9  .... Two times a day 20)  Cipro 500 Mg Tabs (Ciprofloxacin hcl) .[name].. 1 tablet by mouth two times a day x 5 days  Patient Instructions: 1)  it was good to see you today. 2)  recent labs reviewed - send again for urine cultrue and start Cipro as discussed-  3)  refills done as requested - will need OV every 3 months if you remain on diet pill - will not prescribe beyond 9 months total (until Sept 2012) 4)  we'll make referral to eye doctor and back to dr. Jarold Motto.  Our office will contact you regarding these appointments once made.  5)  continue to work with your counselor and other providers as ongoing 6)  Please schedule a follow-up appointment in 3-6 months to monitor weight,  cholesterol and blood pressure; call sooner if problems.  Prescriptions: DIETHYLPROPION  HCL CR 75 MG XR24H-TAB (DIETHYLPROPION HCL) Take 1 tab  every morning  #30 x 0   Entered and Authorized by:   Newt Lukes MD   Signed by:   Newt Lukes MD on 08/28/2010   Method used:   Print then Give to Patient   RxID:   [id number]    Orders Added: 1)  T-Urine Culture (Spectrum Order) [[id number]] 2)  Est. Patient Level IV [84696] 3)  Gastroenterology Referral [GI] 4)  Ophthalmology Referral [Ophthalmology]

## 2010-09-11 ENCOUNTER — Encounter: Payer: Self-pay | Admitting: Internal Medicine

## 2010-09-12 ENCOUNTER — Ambulatory Visit: Payer: Self-pay | Admitting: Internal Medicine

## 2010-09-12 ENCOUNTER — Telehealth: Payer: Self-pay | Admitting: *Deleted

## 2010-09-12 DIAGNOSIS — R300 Dysuria: Secondary | ICD-10-CM

## 2010-09-12 NOTE — Telephone Encounter (Signed)
Noted - will refer to uro as requested - pcc will arrange

## 2010-09-12 NOTE — Telephone Encounter (Signed)
Notified pt with urine culture results. Pt states that she is still having urine sxs, and would like referral to see urologist.

## 2010-09-13 ENCOUNTER — Other Ambulatory Visit: Payer: Self-pay | Admitting: Internal Medicine

## 2010-09-20 LAB — HM DIABETES EYE EXAM

## 2010-09-25 ENCOUNTER — Other Ambulatory Visit: Payer: Self-pay

## 2010-09-25 MED ORDER — DIETHYLPROPION HCL ER 75 MG PO TB24: 1.0000 | ORAL_TABLET | Freq: Every day | ORAL | Status: DC

## 2010-09-25 NOTE — Telephone Encounter (Signed)
Rx last written 08/28/2010 #30 x 0

## 2010-09-25 NOTE — Telephone Encounter (Signed)
Ok to fill as requested.

## 2010-09-25 NOTE — Telephone Encounter (Signed)
Pt informed, Rx in cabinet for pt pick up  

## 2010-09-26 ENCOUNTER — Encounter: Payer: Self-pay | Admitting: Gastroenterology

## 2010-09-26 ENCOUNTER — Other Ambulatory Visit (INDEPENDENT_AMBULATORY_CARE_PROVIDER_SITE_OTHER): Payer: Managed Care, Other (non HMO)

## 2010-09-26 ENCOUNTER — Ambulatory Visit (INDEPENDENT_AMBULATORY_CARE_PROVIDER_SITE_OTHER): Payer: Managed Care, Other (non HMO) | Admitting: Gastroenterology

## 2010-09-26 DIAGNOSIS — K625 Hemorrhage of anus and rectum: Secondary | ICD-10-CM

## 2010-09-26 DIAGNOSIS — K219 Gastro-esophageal reflux disease without esophagitis: Secondary | ICD-10-CM

## 2010-09-26 DIAGNOSIS — K5900 Constipation, unspecified: Secondary | ICD-10-CM

## 2010-09-26 MED ORDER — ANALPRAM E 2.5-1 & 1 % RE KIT: 1.0000 "application " | PACK | RECTAL | Status: DC | PRN

## 2010-09-26 MED ORDER — PEG-KCL-NACL-NASULF-NA ASC-C 100 G PO SOLR: 1.0000 | Freq: Once | ORAL | Status: AC

## 2010-09-26 MED ORDER — ALIGN PO CAPS: 1.0000 | ORAL_CAPSULE | Freq: Every day | ORAL | Status: DC

## 2010-09-26 NOTE — Progress Notes (Signed)
History of Present Illness:  This is a 55 year old African American female with chronic GERD doing well on Dexilant 60 mg a day. Now presents with intermittent rectal bleeding and abdominal gas, bloating, and leg discomfort. Her last colonoscopy was several years ago. She denies excessive use of sorbitol, fructose, or lactose. She has had voluntary weight loss and denies systemic complaints. She has alternating diarrhea and constipation and uses MiraLax on a regular basis at bedtime. Is on multiple medications which are reviewed her record. Patient also is a Jehovah witness. She currently denies dysphagia, or biliary complaints and complaints. She does have a history of osteo-arthritis and endometriosis and hypertension.   ROS: The remainder of the 10 point ROS is negative. She specifically denies anorexia, weight loss, nausea vomiting, any cardiovascular, pulmonary, genitourinary, or neurological problems.   Past Medical History  Diagnosis Date  . GERD (gastroesophageal reflux disease)   . Hypertension   . Chronic constipation   . Hiatal hernia   . Endometriosis   . Diabetes mellitus   . Syncopal episodes     since childhood  . Chronic sinusitis   . Osteoarthritis    Past Surgical History  Procedure Date  . Breast surgery     bilateral  . Tubal ligation   . Hiatal hernia repair   . Laser ablation of the cervix   . Functional endoscopic sinus surgery     reports that she has never smoked. She does not have any smokeless tobacco history on file. She reports that she does not drink alcohol or use illicit drugs. family history includes Colitis in her brother; Diabetes in her mother; Kidney disease in her father; and Prostate cancer in her father. Allergies  Allergen Reactions  . Codeine   . Propoxyphene Hcl        Physical Exam: General well developed well nourished patient in no acute distress, appearing their stated age Eyes PERRLA, no icterus, fundoscopic exam per  opthamologist Skin no lesions noted Neck supple, no adenopathy, no thyroid enlargement, no tenderness Chest clear to percussion and auscultation Heart no significant murmurs, gallops or rubs noted Abdomen no hepatosplenomegaly masses or tenderness, BS normal.  Extremities no acute joint lesions, edema, phlebitis or evidence of cellulitis. Neurologic patient oriented x 3, cranial nerves intact, no focal neurologic deficits noted. Psychological mental status normal and normal affect.  Assessment and plan: Her rest of the reflux is doing well on daily PPI therapy. I am concerned about her history of rectal bleeding and severe constipation and have scheduled  colonoscopy at her convenience area and anemia profile and celiac serologies ordered. I placed her on probiotic therapy in the interim with when necessary local Analpram cream.

## 2010-09-26 NOTE — Patient Instructions (Signed)
Your prescription(s) have been sent to you pharmacy.  Please go to the basement today for your labs.  Your procedure has been scheduled for 10/11/2010, please follow the seperate instructions.  Take align once a day, samples given. Use the analpram as needed per your rectum, samples given.

## 2010-09-27 ENCOUNTER — Encounter: Payer: Self-pay | Admitting: Internal Medicine

## 2010-09-27 LAB — FOLATE: Folate: 10.7 ng/mL (ref >5.9)

## 2010-09-27 LAB — VITAMIN B12: Vitamin B-12: 275 pg/mL (ref 211–911)

## 2010-09-27 LAB — IBC PANEL
Iron: 54 ug/dL (ref 42–145)
Saturation Ratios: 15.3 % — ABNORMAL LOW (ref 20.0–50.0)
Transferrin: 252.7 mg/dL (ref 212.0–360.0)

## 2010-09-27 LAB — IGA: IgA: 122 mg/dL (ref 68–378)

## 2010-09-27 LAB — GLIA (IGA/G) + TTG IGA
Gliadin IgA: 2.6 U/mL (ref <20)
Gliadin IgG: 8.8 U/mL (ref <20)
Tissue Transglutaminase Ab, IgA: 3.3 U/mL (ref <20)

## 2010-09-27 LAB — FERRITIN: Ferritin: 45.5 ng/mL (ref 10.0–291.0)

## 2010-09-28 ENCOUNTER — Telehealth: Payer: Self-pay | Admitting: *Deleted

## 2010-09-28 NOTE — Telephone Encounter (Signed)
Patient aware, staff message to have b12 in one year.

## 2010-09-28 NOTE — Telephone Encounter (Signed)
Message copied by Harlow Mares on Thu Sep 28, 2010  9:00 AM ------      Message from: Jarold Motto, DAVID      Created: Wed Sep 27, 2010 12:00 PM       Repeat serum B12 level in one year.

## 2010-10-04 LAB — DIFFERENTIAL
Basophils Absolute: 0 10*3/uL (ref 0.0–0.1)
Basophils Relative: 0 % (ref 0–1)
Eosinophils Absolute: 0.1 10*3/uL (ref 0.0–0.7)
Eosinophils Relative: 1 % (ref 0–5)
Lymphocytes Relative: 26 % (ref 12–46)
Lymphs Abs: 1.7 10*3/uL (ref 0.7–4.0)
Monocytes Absolute: 0.5 10*3/uL (ref 0.1–1.0)
Monocytes Relative: 7 % (ref 3–12)
Neutro Abs: 4.3 10*3/uL (ref 1.7–7.7)
Neutrophils Relative %: 66 % (ref 43–77)

## 2010-10-04 LAB — CBC
HCT: 37.3 % (ref 36.0–46.0)
Hemoglobin: 12.8 g/dL (ref 12.0–15.0)
MCHC: 34.2 g/dL (ref 30.0–36.0)
MCV: 92.9 fL (ref 78.0–100.0)
Platelets: 121 10*3/uL — ABNORMAL LOW (ref 150–400)
RBC: 4.02 MIL/uL (ref 3.87–5.11)
RDW: 14.6 % (ref 11.5–15.5)
WBC: 6.6 10*3/uL (ref 4.0–10.5)

## 2010-10-04 LAB — BASIC METABOLIC PANEL
BUN: 8 mg/dL (ref 6–23)
CO2: 29 mEq/L (ref 19–32)
Calcium: 9.1 mg/dL (ref 8.4–10.5)
Chloride: 105 mEq/L (ref 96–112)
Creatinine, Ser: 0.91 mg/dL (ref 0.4–1.2)
GFR calc Af Amer: >60 mL/min (ref >60)
GFR calc non Af Amer: >60 mL/min (ref >60)
Glucose, Bld: 92 mg/dL (ref 70–99)
Potassium: 3.8 mEq/L (ref 3.5–5.1)
Sodium: 139 mEq/L (ref 135–145)

## 2010-10-04 LAB — POCT HEMOGLOBIN-HEMACUE: Hemoglobin: 12.2 g/dL (ref 12.0–15.0)

## 2010-10-10 ENCOUNTER — Encounter: Payer: Self-pay | Admitting: Gastroenterology

## 2010-10-11 ENCOUNTER — Ambulatory Visit (AMBULATORY_SURGERY_CENTER): Payer: Managed Care, Other (non HMO) | Admitting: Gastroenterology

## 2010-10-11 ENCOUNTER — Encounter: Payer: Self-pay | Admitting: Gastroenterology

## 2010-10-11 ENCOUNTER — Telehealth: Payer: Self-pay | Admitting: Internal Medicine

## 2010-10-11 DIAGNOSIS — K644 Residual hemorrhoidal skin tags: Secondary | ICD-10-CM

## 2010-10-11 DIAGNOSIS — Z1211 Encounter for screening for malignant neoplasm of colon: Secondary | ICD-10-CM

## 2010-10-11 DIAGNOSIS — Z86010 Personal history of colonic polyps: Secondary | ICD-10-CM

## 2010-10-11 DIAGNOSIS — K648 Other hemorrhoids: Secondary | ICD-10-CM

## 2010-10-11 LAB — GLUCOSE, CAPILLARY
Glucose-Capillary: 84 mg/dL (ref 70–99)
Glucose-Capillary: 61 mg/dL — ABNORMAL LOW (ref 70–99)
Glucose-Capillary: 75 mg/dL (ref 70–99)

## 2010-10-11 MED ORDER — SODIUM CHLORIDE 0.9 % IV SOLN: 500.0000 mL | INTRAVENOUS | Status: DC

## 2010-10-11 NOTE — Telephone Encounter (Signed)
C/o cramping after eating and apple. Had colonoscopy this morning (reviewed. Negative) and was ok afterwards. Recommended peptobismol, warm liquids, and rest. No N/V, bleeding, or fever. Not severe now, but if were to progress, told to go immediately to ER. She verbalized understanding.

## 2010-10-11 NOTE — Progress Notes (Signed)
Pt told Lestine Box, Tech, when she transported pt to the procedure room that she was having chest discomfort.  Pt's EKG showed sinus rhythm.  HR 60.  B/P 195/96.  Dr. Jarold Motto was made aware of this complaint.  Per Dr. Jarold Motto proceed with procedure.

## 2010-10-11 NOTE — Patient Instructions (Signed)
Please eat a diet that is high in fiber  You may resume your medications as you would normally take them  Repeat exam in 10 years

## 2010-10-12 ENCOUNTER — Telehealth: Payer: Self-pay | Admitting: *Deleted

## 2010-10-12 NOTE — Telephone Encounter (Signed)
Follow up Call- Patient questions:  Do you have a fever, pain , or abdominal swelling? yes Pain Score  4 *  Have you tolerated food without any problems? no  Have you been able to return to your normal activities? yes  Do you have any questions about your discharge instructions: Diet   yes Medications  no Follow up visit  no  Do you have questions or concerns about your Care? yes  Actions: * If pain score is 4 or above: No action needed, pain <4. Patient stated that she had pancakes right after her procedure yesterday and tolerated them well. Then she came home and had an apple later on. Since then she can't expel gas.   I suggested hot tea, and she will call me back later to let me know how she's doing. D/C instructions reviewed.

## 2010-10-30 ENCOUNTER — Telehealth: Payer: Self-pay

## 2010-10-30 NOTE — Telephone Encounter (Signed)
Pt called stating she if having difficulty breathing with CP. Pt states she began experiencing it last week but sxs eventually subsided. Pt encouraged pt to make and appt today with her PCP but she declined and requested and appt tomorrow. Pt was again advised to go to the ER this evening if she is unable to make OV this afternoon but pt again declined stating she needed to go to work. Pt was transferred to schedule appt as requested but advised to go to ER if sxs worsen, numbness tingling jaw pain or HA develop. Pt understood.

## 2010-10-31 ENCOUNTER — Ambulatory Visit (INDEPENDENT_AMBULATORY_CARE_PROVIDER_SITE_OTHER): Payer: Managed Care, Other (non HMO) | Admitting: Internal Medicine

## 2010-10-31 ENCOUNTER — Encounter: Payer: Self-pay | Admitting: Internal Medicine

## 2010-10-31 ENCOUNTER — Ambulatory Visit (INDEPENDENT_AMBULATORY_CARE_PROVIDER_SITE_OTHER)
Admission: RE | Admit: 2010-10-31 | Discharge: 2010-10-31 | Disposition: A | Discharge status: Home / Self Care | Payer: Managed Care, Other (non HMO) | Source: Ambulatory Visit | Attending: Internal Medicine | Admitting: Internal Medicine

## 2010-10-31 DIAGNOSIS — R0600 Dyspnea, unspecified: Secondary | ICD-10-CM

## 2010-10-31 DIAGNOSIS — R0609 Other forms of dyspnea: Secondary | ICD-10-CM

## 2010-10-31 DIAGNOSIS — R0989 Other specified symptoms and signs involving the circulatory and respiratory systems: Secondary | ICD-10-CM

## 2010-10-31 DIAGNOSIS — F411 Generalized anxiety disorder: Secondary | ICD-10-CM

## 2010-10-31 MED ORDER — MELOXICAM 15 MG PO TABS: 15.0000 mg | ORAL_TABLET | Freq: Every day | ORAL | Status: DC | PRN

## 2010-10-31 MED ORDER — GABAPENTIN 300 MG PO CAPS: 300.0000 mg | ORAL_CAPSULE | Freq: Three times a day (TID) | ORAL | Status: DC

## 2010-10-31 MED ORDER — ALPRAZOLAM 0.5 MG PO TABS: 0.5000 mg | ORAL_TABLET | Freq: Three times a day (TID) | ORAL | Status: DC | PRN

## 2010-10-31 NOTE — Patient Instructions (Signed)
It was good to see you today. Use xanax as needed for anxiety and panic attacks as discussed with increase zoloft dose - or as per dr. Evelene Croon Your prescription(s) have been submitted to your pharmacy. Please take as directed and contact our office if you believe you are having problem(s) with the medication(s). Use gabapentin and meloxicam for pain at night - Refill on medication(s) as discussed today. Test(s) ordered today. Your results will be called to you after review (48-72hours after test completion). If any changes need to be made, you will be notified at that time. Please schedule followup in 3-4 months, call sooner if problems or worsening problems with pain or breathing.

## 2010-10-31 NOTE — Progress Notes (Signed)
Subjective:    Patient ID: Janet Le, female    DOB: 03-31-1956, 55 y.o.   MRN: [id number]  HPI  complains of CP - Onset 1 week ago associated with with shortness of breath but not DOE Precipitated by stress - family and work No dizziness - Wax wane intensity of symptoms - currently none Not exertional or positional - brought on my "bad news and stress" No cough, pleurisy or fever - no recent travel - no trauma Similar to prior panic attacks - SSRI dose just increased by psyc last week Declines EKG despite multiple suggestions for same "i had one done not long ago" Improved since stopping diet med (stimulant)   also reviewed chronic med issues: obesity - working hard with meal replacement and exercise to lose weight - feels clothes are fitting better - originall size 20 - down 19 lbs since 09/2009 - highest weight 03/2009 - 252# - prev on diethylpropion 06/2010 - stopped with onset of panic attacks last week 10/2010  seasonal allg - worse during spring season - prefers claritin to Careers adviser for symptoms control - uses nasal spray most days no fever or sinus pressure or pain - no HA or cough or ST  GERD - reports indigestion better if taking ppi meds - no pain or reflux or change in BM -100% med compliance  HTN - reports compliance with ongoing medical treatment and no changes in medication dose or frequency. denies adverse side effects related to current therapy. no increase in edema or HA  diet controlled DM - watching carbs and losing weight efforts as above - take cinnamon but no med rx - does not check home cbg regularly  OA - knee and back - still waiting on approval for surg in [place] - ?water aerobics therapy - also c/o tightness and "pulling" B achilles region upon initiation of standing and walking - symptoms resolve after 30sec of walking, tight but not painful - no ankle or heel pain - no popping or new activity reported  Past Medical History  Diagnosis Date  . GERD  (gastroesophageal reflux disease)   . Hypertension   . Chronic constipation   . Hiatal hernia   . Endometriosis   . Syncopal episodes     since childhood  . Chronic sinusitis   . Diabetes mellitus   . Osteoarthritis   . Hyperlipidemia   . Allergic rhinitis, cause unspecified     Review of Systems  Constitutional: Negative for fever.  Respiratory: Negative for cough and wheezing.   Cardiovascular: Negative for leg swelling.  Genitourinary: Negative for dysuria.  Neurological: Negative for syncope and headaches.       Objective:   Physical Exam BP 112/80  Pulse 94  Temp 98 F (36.7 C)  Resp 18  SpO2 97% Physical Exam  Constitutional: She is oriented to person, place, and time. She appears well-developed and well-nourished. No distress.  Neck: Normal range of motion. Neck supple. No JVD present. No thyromegaly present.  Cardiovascular: Normal rate, regular rhythm and normal heart sounds.  No murmur heard. Pulmonary/Chest: Effort normal and breath sounds normal. No respiratory distress. She has no wheezes. l.  Skin: Skin is warm and dry. No rash noted. No erythema. few erythema nodosum on BLE and LUE. Psychiatric: She has anxious mood and affect. Her behavior is normal. Judgment and thought content normal.   Lab Results  Component Value Date   WBC 7.2 08/18/2010   HGB 12.6 08/18/2010   HCT 37.1 08/18/2010  PLT 145.0* 08/18/2010   CHOL 205* 03/15/2010   TRIG 95.0 03/15/2010   HDL 53.40 03/15/2010   LDLDIRECT 140.5 03/15/2010   ALT 14 08/18/2010   AST 21 08/18/2010   NA 140 08/18/2010   K 3.4* 08/18/2010   CL 103 08/18/2010   CREATININE 0.9 08/18/2010   BUN 16 08/18/2010   CO2 32 08/18/2010   TSH 1.76 08/18/2010   HGBA1C 5.9 08/18/2010        Assessment & Plan:  Panic attacks - associated with with intermittent CP and SOB - declines EKG despite multiple suggestions to do same - on inc dose SSRI by psyc Evelene Croon) - cont same - will check CXR due to dyspnea and add prn BZ - Pt  instructed to go to ER if symptoms worse or unchanged - pt agrees to same

## 2010-11-01 ENCOUNTER — Encounter: Payer: Self-pay | Admitting: Internal Medicine

## 2010-11-02 ENCOUNTER — Telehealth: Payer: Self-pay | Admitting: *Deleted

## 2010-11-02 NOTE — Telephone Encounter (Signed)
Notified pt of cxr results.Marland KitchenMarland Kitchen5/10/12@10 :03am/LMB

## 2010-11-07 NOTE — Op Note (Signed)
Janet Le, Janet Le              ACCOUNT NO.:  [id number]   MEDICAL RECORD NO.:  [id number]          PATIENT TYPE:  AMB   LOCATION:  DSC                          FACILITY:  [facility]   PHYSICIAN:  Jefry H. Pollyann Kennedy, MD     DATE OF BIRTH:  1955/08/20   DATE OF PROCEDURE:  10/11/2008  DATE OF DISCHARGE:  10/11/2008                               OPERATIVE REPORT   PREOPERATIVE DIAGNOSES:  Chronic left side ethmoid, frontal, and  maxillary sinusitis with significant headache.   POSTOPERATIVE DIAGNOSES:  Chronic left side ethmoid, frontal, and  maxillary sinusitis with significant headache.   PROCEDURE:  Left endoscopic total ethmoidectomy, left endoscopic  maxillary antrostomy with removal of tissue, and left endoscopic  ethmoidectomy with stripping of tissue.   ANESTHESIA:  General endotracheal anesthesia was used.   COMPLICATIONS:  There were no complications.   BLOOD LOSS:  Minimal.   FINDINGS:  Diffuse polypoid changes throughout the ethmoid cavities,  especially the frontal recess and the posterior cells.  There is a large  posterior ethmoid cell that was filled with thickened and polypoid-type  mucosa with slight erythema.  There were cells that contained thick pus.  The left sphenoid contained a thick mucoid material as well as polypoid  mucosa.  The left maxillary sinus was filled with large polyps.  There  were no complications.  There no the bony dehiscences of the fovea or  the lamina papyracea.   HISTORY:  This is a 55 year old with history of chronic sinusitis.  She  has recently had severe headaches and had bilateral sinus disease  present initially but after aggressive antibiotic therapy, the right  side has cleared.  The left side has persisted and that is where her  symptoms have also persisted.  Risks, benefits, alternatives, and  complications of procedure were explained to the patient, seem to  understand and agreed to surgery.   PROCEDURE:  The patient was taken  to the operating room and placed on  the operating table in supine position.  Following induction of general  endotracheal anesthesia, the face was prepped and draped in a standard  fashion.  Afrin spray was used preoperatively.  Xylocaine 1% with  epinephrine was infiltrated into the superior and posterior attachments  of the middle turbinate on the left and the lateral nasal wall.  1. Left endoscopic total ethmoidectomy.  A sickle knife was used to      take down the uncinate process.  The microdebrider was then used      with a 0 and 30-degree nasal endoscope to perform a complete      ethmoid dissection.  The ground lamella was taken down to expose      posterior cells.  A lateral limit of the dissection was the lamina      papyracea which was kept intact.  The superior limit of dissection      was the fovea which was also kept intact.  The frontal recess was      cleaned of polypoid tissue, and there was easy access into the      frontal  duct and frontal sinus.  There was diffuse polypoid changes      seen throughout the ethmoid complex and some cells that contained      thick pus.  2. Left endoscopic maxillary antrostomy with removal of tissue.  After      the uncinate was taken down, the anterior fontanelle was inspected      and the left maxillary sinus was entered using a curved suction.      The back-biting forceps was used to create a large antrostomy      anteriorly and straight Tru-Cut forceps to do the same posteriorly.      Large amount of polypoid debris was removed using angled forceps.  3. Left endoscopic sphenoidotomy.  After the ethmoid dissection was      completed, the face of the sphenoid was exposed and opened using a      straight suction.  There was a mucoid-type material and large      amount of edematous polypoid tissue that was seen in the anterior-      inferior aspect of the sphenoid.  A Kerrison rongeur was used to      enlarge the sphenoidotomy inferiorly  and medially.  The soft tissue      was removed from within.  The nasal and sinus cavities were      suctioned of blood and debris, and the left ethmoid was packed with      a Kennedy pack.  The pharynx was suctioned of blood and secretions.      The patient was then awakened, extubated, and transferred to      recovery in stable condition.      Jefry H. Pollyann Kennedy, MD  Electronically Signed     JHR/MEDQ  D:  10/12/2008  T:  10/13/2008  Job:  [id number]

## 2010-11-10 NOTE — Op Note (Signed)
NAMENARAYA, STONEBERG                        ACCOUNT NO.:  [id number]   MEDICAL RECORD NO.:  [id number]                   PATIENT TYPE:  AMB   LOCATION:  SDC                                  FACILITY:  [facility]   PHYSICIAN:  Tracie Harrier, M.D.              DATE OF BIRTH:  February 17, 1956   DATE OF PROCEDURE:  08/24/2002  DATE OF DISCHARGE:                                 OPERATIVE REPORT   PREOPERATIVE DIAGNOSIS:  Right pelvic mass consistent with right  hydrosalpinx.   POSTOPERATIVE DIAGNOSIS:  Right hydrosalpinx.   PROCEDURES:  1. Open laparoscopy.  2. Right salpingectomy.   SURGEON:  Tracie Harrier, M.D.   ANESTHESIA:  General.   ESTIMATED BLOOD LOSS:  Less than 20 mL.   COMPLICATIONS:  None.   FINDINGS:  An enlarged tube consistent with a right hydrosalpinx was  identified.  This was about 9 x 4 cm in size.  This was filled with clear,  dark fluid.  The uterus was visualized and noted to be normal.  The left  adnexa also was normal.  The appendix was visualized and noted to be normal  as well.   DESCRIPTION OF PROCEDURE:  The patient was taken to the operating room,  where a general endotracheal anesthetic was administered.  The patient was  placed on the operating table in the dorsal lithotomy position.  The  abdomen, perineum, and vagina were prepped and draped in the usual sterile  fashion with Betadine and sterile drapes.  The bladder was emptied with a  red rubber catheter.  A Hulka tenaculum was then placed in the intrauterine  cavity.  Next the infraumbilical region was infiltrated with 8 mL of 0.25%  Marcaine and 2 mL was infiltrated in the suprapubic area.  A small vertical  incision was made in the umbilical area and through this, a Veress needle  was inserted without difficulty and without complication into the  intraperitoneal cavity.  Carbon dioxide gas was then used to create a  pneumoperitoneum.  This was done with 3.6 L of carbon dioxide gas,  creating  an intra-abdominal pressure of 15 mmHg.  The Veress needle was then removed  and a disposable laparoscopic trocar was inserted atraumatically into the  abdominal cavity.  The laparoscope was then inserted with the above-noted  findings.   A secondary trocar was then placed two fingerbreadths above the pubic  symphysis and in the midline.  Through this small incision, a 5 mm trocar  was placed under direct, continuous laparoscopic guidance.  Through this a  grasper was placed and the tube was elevated into the incision.  The  mesosalpinx was then cauterized and the tube was systematically dissected  off the mesosalpinx using a burn-and-cut method with the Gyrus bipolar  device.  The upper extent of the tube was adherent to bowel, omentum.  This  was likewise cauterized and divided.  The tube  was then removed from the  umbilical port.  The pelvis was visualized and noted to be hemostatic.  All  abdominal instruments were removed and attention was turned to closure.  The  umbilical site was closed first with two interrupted sutures  of 0 Vicryl in the muscle fascia.  The skin was reapproximated with  Dermabond glue.  All vaginal instruments were removed.  The patient was  awakened, extubated, and taken to the recovery room in good condition.  There were no perioperative complications.                                               Tracie Harrier, M.D.    REG/MEDQ  D:  08/24/2002  T:  08/24/2002  Job:  [id number]

## 2010-11-10 NOTE — H&P (Signed)
NAME:  Janet Le, Janet Le NO.:  [id number]   MEDICAL RECORD NO.:  [id number]                   PATIENT TYPE:   LOCATION:                                       FACILITY:   PHYSICIAN:  Tracie Harrier, M.D.              DATE OF BIRTH:   DATE OF ADMISSION:  DATE OF DISCHARGE:                                HISTORY & PHYSICAL   HISTORY OF PRESENT ILLNESS:  Janet Le is a 55 year old married female,  Gravida IV, Para III admitted for laparoscopy and removal or right pelvic  mass, consistent with a hydrosalpinx. The patient was initially evaluated by  Dr. Sheryn Bison for upper abdominal pain. During that workup, a pelvic  CT showed a pelvic mass. The pelvic mass was visualized and noted to be 9 x  3 cm in size. The patient was seen in my office on August 17, 2002 where  she underwent an ultrasound of the pelvis. A tubular structure, 8 cm in size  times about 4 cm in size was noted, consistent with a hydrosalpinx. A CA125  obtained was normal. The patient had a normal PAP smear in October of 2003.  She is admitted for laparoscopy and removal of this pelvic mass, thought to  be a hydrosalpinx. She does have a history of chronic hypertension.  She is  on multiple medications for this and is followed for this by [facility].   PAST MEDICAL HISTORY:  1. History of chronic hypertension.  2. History of hiatal hernia.   PAST SURGICAL HISTORY:  History of umbilical hernia repair.   OBSTETRIC HISTORY:  Normal spontaneous vaginal delivery times three at term.   SOCIAL HISTORY:  Negative for alcohol or smoking.   CURRENT MEDICATIONS:  Include Maxzide, Zelnorm, Wellbutrin, Norvasc, Nexium  and potassium supplementation.   ALLERGIES:  No known drug allergies.   PHYSICAL EXAMINATION:  VITAL SIGNS: Stable. Blood pressure 110/80,  temperature 98.6.  GENERAL: A well developed, well nourished female in no acute distress.  HEENT: Within normal  limits.  NECK: Supple. Without adenopathy or thyromegaly.  HEART/LUNGS: Clear.  BREAST: Examination deferred.  ABDOMEN: Soft and benign without masses, tenderness, or organomegaly.  PELVIC: Normal external female genitalia. Vagina and cervix clear. Uterus is  upper limits of normal. The right adnexa is tender with mass noted. The left  adnexa is clear.   DIAGNOSTIC IMPRESSION:  Ultrasound confirms a left pelvic mass consistent  with a hydrosalpinx.   ADMISSION DIAGNOSES:  1. Pelvic mass consistent with hydrosalpinx.  2. Chronic hypertension.   PLAN:  Laparoscopy and right salpingectomy.   DISCUSSION:  The risks and benefits of this procedure were discussed with  the patient. The risk of bleeding, infection, etc. was reviewed. Also, the  therapy and procedure were reviewed with the patient. Also, the risk of  laparotomy for this procedure was discussed. If there is operative  difficulty,  I mentioned to her that it might be necessary to proceed with  laparotomy for safety sake. Questions are answered regarding this surgery.                                                  Tracie Harrier, M.D.    REG/MEDQ  D:  08/23/2002  T:  08/23/2002  Job:  [id number]

## 2010-11-14 ENCOUNTER — Ambulatory Visit (INDEPENDENT_AMBULATORY_CARE_PROVIDER_SITE_OTHER): Payer: Managed Care, Other (non HMO) | Admitting: Gastroenterology

## 2010-11-14 ENCOUNTER — Encounter: Payer: Self-pay | Admitting: Gastroenterology

## 2010-11-14 DIAGNOSIS — K649 Unspecified hemorrhoids: Secondary | ICD-10-CM | POA: Insufficient documentation

## 2010-11-14 DIAGNOSIS — R143 Flatulence: Secondary | ICD-10-CM

## 2010-11-14 DIAGNOSIS — K589 Irritable bowel syndrome without diarrhea: Secondary | ICD-10-CM

## 2010-11-14 DIAGNOSIS — K644 Residual hemorrhoidal skin tags: Secondary | ICD-10-CM

## 2010-11-14 DIAGNOSIS — R141 Gas pain: Secondary | ICD-10-CM

## 2010-11-14 DIAGNOSIS — K219 Gastro-esophageal reflux disease without esophagitis: Secondary | ICD-10-CM

## 2010-11-14 HISTORY — DX: Irritable bowel syndrome, unspecified: K58.9

## 2010-11-14 MED ORDER — RIFAXIMIN 550 MG PO TABS: 550.0000 mg | ORAL_TABLET | Freq: Two times a day (BID) | ORAL | Status: DC

## 2010-11-14 MED ORDER — ALIGN PO CAPS: 1.0000 | ORAL_CAPSULE | Freq: Every day | ORAL | Status: DC

## 2010-11-14 NOTE — Progress Notes (Addendum)
This is a  55 year old African American femalewho I have been seeing for chronic abdominal gas and bloating unresponsive to probiotic therapy. Also complains of recurrent rectal pain and bleeding, and recent colonoscopy was unremarkable except for some external hemorrhoids. Currently desires surgical treatment of her hemorrhoids. She has mild lactose intolerance and denies use of sorbitol fructose. She is on Dexilant 60 mg a day for chronic acid reflux, and also chronic Mobic use. By these complaints she's had no anorexia or weight loss. She is followed by cardiology for hypertensive cardiovascular disease.   Current Medications, Allergies, Past Medical History, Past Surgical History, Family History and Social History were reviewed in [employer] record.  Pertinent Review of Systems Negative   Physical Exam: Mildly obese female appears stated age. I cannot appreciate stigmata of chronic liver disease. Abdominal exam shows no organomegaly, masses or tenderness. Bowel sounds are nonobstructed. Inspection rectum does show redundant tissue and some nonbleeding external hemorrhoids. Digital exam is deferred.    Assessment and Plan: Constipation predominant IBS with associated gas and bloating, consider possible bacterial overgrowth syndrome. I have asked her to continue with fiber as tolerated with fiber supplements and liberal by mouth fluids. Recent celiac antibodies and other tests for malabsorption were negative. We will give her an empiric trial of Xifaxan 550 mg twice a day for 2 weeks with continued probiotic therapy. I referred her to [facility] surgery for consideration of surgical hemorrhoidectomy. We also supplied 5% Xylocaine cream to be used when necessary to her hemorrhoids as needed. Encounter Diagnosis  Name Primary?  . External hemorrhoid

## 2010-11-14 NOTE — Patient Instructions (Signed)
Take Align once a day while on Xifaxan. We will contact you about an appt with Dr Luisa Hart with [facility].

## 2010-12-11 ENCOUNTER — Other Ambulatory Visit (HOSPITAL_COMMUNITY): Payer: Self-pay | Admitting: Urology

## 2010-12-11 DIAGNOSIS — N281 Cyst of kidney, acquired: Secondary | ICD-10-CM

## 2010-12-13 ENCOUNTER — Ambulatory Visit (HOSPITAL_COMMUNITY)
Admission: RE | Admit: 2010-12-13 | Discharge: 2010-12-13 | Disposition: A | Discharge status: Home / Self Care | Payer: Managed Care, Other (non HMO) | Source: Ambulatory Visit | Attending: Urology | Admitting: Urology

## 2010-12-13 DIAGNOSIS — M47817 Spondylosis without myelopathy or radiculopathy, lumbosacral region: Secondary | ICD-10-CM | POA: Insufficient documentation

## 2010-12-13 DIAGNOSIS — N289 Disorder of kidney and ureter, unspecified: Secondary | ICD-10-CM | POA: Insufficient documentation

## 2010-12-13 DIAGNOSIS — D739 Disease of spleen, unspecified: Secondary | ICD-10-CM | POA: Insufficient documentation

## 2010-12-13 DIAGNOSIS — N281 Cyst of kidney, acquired: Secondary | ICD-10-CM

## 2010-12-13 MED ORDER — GADOBENATE DIMEGLUMINE 529 MG/ML IV SOLN
20.0000 mL | Freq: Once | INTRAVENOUS | Status: AC | PRN
  Administered 2010-12-13: 20 mL via INTRAVENOUS

## 2010-12-18 ENCOUNTER — Other Ambulatory Visit (HOSPITAL_COMMUNITY): Payer: Managed Care, Other (non HMO)

## 2010-12-19 ENCOUNTER — Other Ambulatory Visit: Payer: Self-pay | Admitting: Urology

## 2010-12-19 ENCOUNTER — Ambulatory Visit (HOSPITAL_COMMUNITY)
Admission: RE | Admit: 2010-12-19 | Discharge: 2010-12-19 | Disposition: A | Discharge status: Home / Self Care | Payer: Managed Care, Other (non HMO) | Source: Ambulatory Visit | Attending: Urology | Admitting: Urology

## 2010-12-19 DIAGNOSIS — D49519 Neoplasm of unspecified behavior of unspecified kidney: Secondary | ICD-10-CM

## 2010-12-19 DIAGNOSIS — N289 Disorder of kidney and ureter, unspecified: Secondary | ICD-10-CM | POA: Insufficient documentation

## 2010-12-20 ENCOUNTER — Other Ambulatory Visit: Payer: Self-pay | Admitting: Urology

## 2010-12-20 DIAGNOSIS — N2889 Other specified disorders of kidney and ureter: Secondary | ICD-10-CM

## 2010-12-31 ENCOUNTER — Other Ambulatory Visit: Payer: Self-pay | Admitting: Internal Medicine

## 2010-12-31 ENCOUNTER — Other Ambulatory Visit: Payer: Self-pay | Admitting: Gastroenterology

## 2011-01-08 ENCOUNTER — Other Ambulatory Visit: Payer: Self-pay | Admitting: *Deleted

## 2011-01-08 DIAGNOSIS — F411 Generalized anxiety disorder: Secondary | ICD-10-CM

## 2011-01-08 DIAGNOSIS — R0600 Dyspnea, unspecified: Secondary | ICD-10-CM

## 2011-01-08 MED ORDER — ALPRAZOLAM 0.5 MG PO TABS: 0.5000 mg | ORAL_TABLET | Freq: Three times a day (TID) | ORAL | Status: DC | PRN

## 2011-01-09 NOTE — Telephone Encounter (Signed)
Faxed script back to [facility] @ [phone].Marland KitchenMarland Kitchen7/17/12@11 :08am/LMB

## 2011-01-10 ENCOUNTER — Ambulatory Visit
Admission: RE | Admit: 2011-01-10 | Discharge: 2011-01-10 | Disposition: A | Discharge status: Home / Self Care | Payer: Managed Care, Other (non HMO) | Source: Ambulatory Visit | Attending: Urology | Admitting: Urology

## 2011-01-10 VITALS — BP 143/87 | HR 90 | Temp 97.5°F | Resp 16 | Ht 62.0 in | Wt 224.0 lb

## 2011-01-10 DIAGNOSIS — N2889 Other specified disorders of kidney and ureter: Secondary | ICD-10-CM

## 2011-01-17 ENCOUNTER — Other Ambulatory Visit (HOSPITAL_COMMUNITY): Payer: Self-pay | Admitting: Interventional Radiology

## 2011-01-17 DIAGNOSIS — C649 Malignant neoplasm of unspecified kidney, except renal pelvis: Secondary | ICD-10-CM

## 2011-01-22 ENCOUNTER — Other Ambulatory Visit: Payer: Self-pay | Admitting: Internal Medicine

## 2011-01-29 ENCOUNTER — Encounter: Payer: Self-pay | Admitting: *Deleted

## 2011-01-29 ENCOUNTER — Ambulatory Visit (INDEPENDENT_AMBULATORY_CARE_PROVIDER_SITE_OTHER): Payer: Managed Care, Other (non HMO) | Admitting: Internal Medicine

## 2011-01-29 ENCOUNTER — Encounter: Payer: Self-pay | Admitting: Internal Medicine

## 2011-01-29 ENCOUNTER — Other Ambulatory Visit (INDEPENDENT_AMBULATORY_CARE_PROVIDER_SITE_OTHER): Payer: Managed Care, Other (non HMO)

## 2011-01-29 ENCOUNTER — Telehealth: Payer: Self-pay | Admitting: *Deleted

## 2011-01-29 DIAGNOSIS — C649 Malignant neoplasm of unspecified kidney, except renal pelvis: Secondary | ICD-10-CM

## 2011-01-29 DIAGNOSIS — R7309 Other abnormal glucose: Secondary | ICD-10-CM

## 2011-01-29 DIAGNOSIS — R5383 Other fatigue: Secondary | ICD-10-CM

## 2011-01-29 DIAGNOSIS — IMO0002 Reserved for concepts with insufficient information to code with codable children: Secondary | ICD-10-CM

## 2011-01-29 DIAGNOSIS — M1710 Unilateral primary osteoarthritis, unspecified knee: Secondary | ICD-10-CM

## 2011-01-29 DIAGNOSIS — R5381 Other malaise: Secondary | ICD-10-CM

## 2011-01-29 LAB — BASIC METABOLIC PANEL
BUN: 16 mg/dL (ref 6–23)
CO2: 32 mEq/L (ref 19–32)
Calcium: 9.9 mg/dL (ref 8.4–10.5)
Chloride: 95 mEq/L — ABNORMAL LOW (ref 96–112)
Creatinine, Ser: 0.8 mg/dL (ref 0.4–1.2)
GFR: 99.9 mL/min (ref >60.00)
Glucose, Bld: 97 mg/dL (ref 70–99)
Potassium: 4 mEq/L (ref 3.5–5.1)
Sodium: 136 mEq/L (ref 135–145)

## 2011-01-29 LAB — TSH: TSH: 3.46 u[IU]/mL (ref 0.35–5.50)

## 2011-01-29 LAB — HEMOGLOBIN A1C: Hgb A1c MFr Bld: 5.9 % (ref 4.6–6.5)

## 2011-01-29 MED ORDER — TRIAMCINOLONE ACETONIDE 0.025 % EX LOTN: 1.0000 "application " | TOPICAL_LOTION | Freq: Three times a day (TID) | CUTANEOUS | Status: DC | PRN

## 2011-01-29 MED ORDER — GABAPENTIN 300 MG PO CAPS: 600.0000 mg | ORAL_CAPSULE | Freq: Three times a day (TID) | ORAL | Status: DC

## 2011-01-29 NOTE — Assessment & Plan Note (Signed)
Continued discomfort - no surgical fix at this time On max meloxicam - increase gabapent

## 2011-01-29 NOTE — Telephone Encounter (Signed)
Pt states she fail to ask md can she increase the meloxicam or gabapentin. Still having pain in lower leg/ calf, and back of foot. Pls advise...01/29/11@1 :46pm

## 2011-01-29 NOTE — Assessment & Plan Note (Signed)
Dx 11/2010 - working with uro on same - imaging reports and interval hx reviewed - surg 01/2011

## 2011-01-29 NOTE — Telephone Encounter (Signed)
Notified pt with md response...01/29/11@4 :42pm/LMB

## 2011-01-29 NOTE — Telephone Encounter (Signed)
Increase gabapen to 600mg  (2-300mg  tabs) 3x/day - already on max meloxicam dose - erx on gaba done

## 2011-01-29 NOTE — Progress Notes (Signed)
Subjective:    Patient ID: Janet Le, female    DOB: June 28, 1955, 55 y.o.   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 Evelene Croon on same Improved since stopping diet med (stimulant) and reducing adderall to once daily  obesity - working hard with meal replacement and exercise to lose weight - feels clothes are fitting better - originall size 20 - down 19 lbs since 09/2009 - highest weight 03/2009 - 252# - prev on diethylpropion 06/2010 - stopped with onset of panic attacks 10/2010  seasonal allg - worse during spring season - prefers claritin to Care[employer] adviser for symptoms control - uses nasal spray most days no fever or sinus pressure or pain - no HA or cough or ST  GERD - reports indigestion better if taking ppi meds - no pain or reflux or change in BM -100% med compliance  HTN - reports compliance with ongoing medical treatment and no changes in medication dose or frequency. denies adverse side effects related to current therapy. no increase in edema or HA  diet controlled DM - watching carbs and losing weight efforts as above - take cinnamon but no med rx - does not check home cbg regularly  OA - knee and back - on delay for any surg in [place] - ongoing water aerobics therapy - no ankle or heel pain - no popping or new problems reported  Past Medical History  Diagnosis Date  . GERD (gastroesophageal reflux disease)   . Hypertension   . Chronic constipation   . Hiatal hernia   . Endometriosis   . Syncopal episodes     since childhood  . Chronic sinusitis   . Diabetes mellitus   . Osteoarthritis   . Hyperlipidemia   . Allergic rhinitis, cause unspecified   . Hemorrhoids   . Renal cell cancer 11/2010 dx     for surg resection 02/16/11    Review of Systems  Constitutional: Negative for fever.  Respiratory: Negative for cough and wheezing.   Cardiovascular: Negative for leg swelling.  Genitourinary: Negative for dysuria.  Neurological: Negative for syncope and headaches.       Objective:   Physical Exam  BP 132/98  Pulse 85  Temp(Src) 98.9 F (37.2 C) (Oral)  Ht 5\' 2"  (1.575 m)  Wt 228 lb 6.4 oz (103.602 kg)  BMI 41.78 kg/m2  SpO2 97%  Constitutional: She is oriented to person, place, and time. She appears well-developed and well-nourished. No distress.  Neck: Normal range of motion. Neck supple. No JVD present. No thyromegaly present.  Cardiovascular: Normal rate, regular rhythm and normal heart sounds.  No murmur heard. chronic 2+ edema Pulmonary/Chest: Effort normal and breath sounds normal. No respiratory distress. She has no wheezes.   Skin: Skin is warm and dry. No rash noted. No erythema.  Psychiatric: She has anxious mood and affect. Her behavior is normal. Judgment and thought content normal.   Lab Results  Component Value Date   WBC 7.2 08/18/2010   HGB 12.6 08/18/2010   HCT 37.1 08/18/2010   PLT 145.0* 08/18/2010   CHOL 205* 03/15/2010   TRIG 95.0 03/15/2010   HDL 53.40 03/15/2010   LDLDIRECT 140.5 03/15/2010  ALT 14 08/18/2010   AST 21 08/18/2010   NA 140 08/18/2010   K 3.4* 08/18/2010   CL 103 08/18/2010   CREATININE 0.9 08/18/2010   BUN 16 08/18/2010   CO2 32 08/18/2010   TSH 1.76 08/18/2010   HGBA1C 5.9 08/18/2010        Assessment & Plan:   See problem list. Medications and labs reviewed today.  Fatigue - nonspecific hx/exam - reports improved since off sertraline - advised f/u with psyc but ok to remain off tx at this time (off >30d) - check tsh

## 2011-01-29 NOTE — Patient Instructions (Signed)
It was good to see you today. Test(s) ordered today. Your results will be called to you after review (48-72hours after test completion). If any changes need to be made, you will be notified at that time. We have reviewed your prior records including labs and tests today Please schedule followup in 3-4 months, call sooner if problems.

## 2011-01-29 NOTE — Assessment & Plan Note (Signed)
Diet controlled (take cinnamon herbal tx) Check a1c now Lab Results  Component Value Date   HGBA1C 5.9 01/29/2011

## 2011-01-30 ENCOUNTER — Telehealth: Payer: Self-pay | Admitting: *Deleted

## 2011-01-30 MED ORDER — TRIAMCINOLONE ACETONIDE 0.025 % EX LOTN: 1.0000 "application " | TOPICAL_LOTION | Freq: Three times a day (TID) | CUTANEOUS | Status: DC | PRN

## 2011-01-30 MED ORDER — GABAPENTIN 300 MG PO CAPS: 600.0000 mg | ORAL_CAPSULE | Freq: Three times a day (TID) | ORAL | Status: DC

## 2011-01-30 MED ORDER — MELOXICAM 15 MG PO TABS: 15.0000 mg | ORAL_TABLET | Freq: Every day | ORAL | Status: DC | PRN

## 2011-01-30 NOTE — Telephone Encounter (Signed)
Called pt concerning labs results. Pt states pharmacy never received med that was sent in yesterday. Resending med to pharmacy...01/30/11@1 :40pm/LMB

## 2011-02-01 ENCOUNTER — Encounter: Payer: Self-pay | Admitting: Internal Medicine

## 2011-02-01 ENCOUNTER — Other Ambulatory Visit: Payer: Self-pay | Admitting: *Deleted

## 2011-02-01 DIAGNOSIS — F411 Generalized anxiety disorder: Secondary | ICD-10-CM

## 2011-02-01 DIAGNOSIS — R0600 Dyspnea, unspecified: Secondary | ICD-10-CM

## 2011-02-01 MED ORDER — TRIAMCINOLONE ACETONIDE 0.025 % EX CREA: 1.0000 "application " | TOPICAL_CREAM | Freq: Three times a day (TID) | CUTANEOUS | Status: DC

## 2011-02-01 MED ORDER — ALPRAZOLAM 0.5 MG PO TABS: 0.5000 mg | ORAL_TABLET | Freq: Three times a day (TID) | ORAL | Status: DC | PRN

## 2011-02-01 NOTE — Telephone Encounter (Signed)
Faxed script [facility] @ [phone]. Pt was notified...02/01/11@4 :13pm/LMB

## 2011-02-01 NOTE — Telephone Encounter (Signed)
Pt states she doesn't want the  triamcinolone lotion that was sent in she needed the cream, also wanted refill on her alprazolam. Sent cream to pharmacy. Is it ok to refill alprazolam...02/01/11@2 :09pm/LMB

## 2011-02-08 ENCOUNTER — Other Ambulatory Visit: Payer: Self-pay | Admitting: Interventional Radiology

## 2011-02-08 ENCOUNTER — Encounter (HOSPITAL_COMMUNITY): Payer: Managed Care, Other (non HMO)

## 2011-02-08 ENCOUNTER — Telehealth: Payer: Self-pay

## 2011-02-08 LAB — SURGICAL PCR SCREEN
MRSA, PCR: INVALID — AB
Staphylococcus aureus: INVALID — AB

## 2011-02-08 LAB — CBC
HCT: 37.8 % (ref 36.0–46.0)
Hemoglobin: 12.2 g/dL (ref 12.0–15.0)
MCH: 29.7 pg (ref 26.0–34.0)
MCHC: 32.3 g/dL (ref 30.0–36.0)
MCV: 92 fL (ref 78.0–100.0)
Platelets: 154 10*3/uL (ref 150–400)
RBC: 4.11 MIL/uL (ref 3.87–5.11)
RDW: 15.3 % (ref 11.5–15.5)
WBC: 5.8 10*3/uL (ref 4.0–10.5)

## 2011-02-08 LAB — BASIC METABOLIC PANEL
BUN: 22 mg/dL (ref 6–23)
CO2: 32 mEq/L (ref 19–32)
Calcium: 10.4 mg/dL (ref 8.4–10.5)
Chloride: 101 mEq/L (ref 96–112)
Creatinine, Ser: 0.85 mg/dL (ref 0.50–1.10)
GFR calc Af Amer: >60 mL/min (ref >60)
GFR calc non Af Amer: >60 mL/min (ref >60)
Glucose, Bld: 69 mg/dL — ABNORMAL LOW (ref 70–99)
Potassium: 3.4 mEq/L — ABNORMAL LOW (ref 3.5–5.1)
Sodium: 141 mEq/L (ref 135–145)

## 2011-02-08 LAB — APTT: aPTT: 30 seconds (ref 24–37)

## 2011-02-08 LAB — NO BLOOD PRODUCTS

## 2011-02-08 LAB — PROTIME-INR
INR: 0.91 (ref 0.00–1.49)
Prothrombin Time: 12.5 seconds (ref 11.6–15.2)

## 2011-02-08 MED ORDER — TRIAMCINOLONE ACETONIDE 0.025 % EX CREA: TOPICAL_CREAM | Freq: Three times a day (TID) | CUTANEOUS | Status: DC

## 2011-02-08 NOTE — Telephone Encounter (Signed)
Pt called requesting Kenalog cream jar rather than tube. Rx updated and sent.

## 2011-02-11 LAB — MRSA CULTURE

## 2011-02-16 ENCOUNTER — Ambulatory Visit (HOSPITAL_COMMUNITY)
Admission: RE | Admit: 2011-02-16 | Discharge: 2011-02-16 | Disposition: A | Discharge status: Home / Self Care | Payer: Managed Care, Other (non HMO) | Source: Ambulatory Visit | Attending: Interventional Radiology | Admitting: Interventional Radiology

## 2011-02-16 ENCOUNTER — Observation Stay (HOSPITAL_COMMUNITY)
Admission: RE | Admit: 2011-02-16 | Discharge: 2011-02-17 | Disposition: A | Discharge status: Home / Self Care | Payer: Managed Care, Other (non HMO) | Source: Ambulatory Visit | Attending: Interventional Radiology | Admitting: Interventional Radiology

## 2011-02-16 DIAGNOSIS — I10 Essential (primary) hypertension: Secondary | ICD-10-CM | POA: Insufficient documentation

## 2011-02-16 DIAGNOSIS — F3289 Other specified depressive episodes: Secondary | ICD-10-CM | POA: Insufficient documentation

## 2011-02-16 DIAGNOSIS — K219 Gastro-esophageal reflux disease without esophagitis: Secondary | ICD-10-CM | POA: Insufficient documentation

## 2011-02-16 DIAGNOSIS — C649 Malignant neoplasm of unspecified kidney, except renal pelvis: Secondary | ICD-10-CM

## 2011-02-16 DIAGNOSIS — D4959 Neoplasm of unspecified behavior of other genitourinary organ: Principal | ICD-10-CM | POA: Insufficient documentation

## 2011-02-16 DIAGNOSIS — R110 Nausea: Secondary | ICD-10-CM | POA: Insufficient documentation

## 2011-02-16 DIAGNOSIS — K449 Diaphragmatic hernia without obstruction or gangrene: Secondary | ICD-10-CM | POA: Insufficient documentation

## 2011-02-16 DIAGNOSIS — E119 Type 2 diabetes mellitus without complications: Secondary | ICD-10-CM | POA: Insufficient documentation

## 2011-02-16 DIAGNOSIS — Z79899 Other long term (current) drug therapy: Secondary | ICD-10-CM | POA: Insufficient documentation

## 2011-02-16 DIAGNOSIS — E669 Obesity, unspecified: Secondary | ICD-10-CM | POA: Insufficient documentation

## 2011-02-16 DIAGNOSIS — F329 Major depressive disorder, single episode, unspecified: Secondary | ICD-10-CM | POA: Insufficient documentation

## 2011-02-16 HISTORY — PX: RENAL CRYOABLATION: SHX2322

## 2011-02-16 LAB — GLUCOSE, CAPILLARY: Glucose-Capillary: 99 mg/dL (ref 70–99)

## 2011-02-21 ENCOUNTER — Other Ambulatory Visit: Payer: Self-pay | Admitting: Interventional Radiology

## 2011-02-21 LAB — BUN: BUN: 12 mg/dL (ref 6–23)

## 2011-02-22 ENCOUNTER — Other Ambulatory Visit: Payer: Self-pay | Admitting: Internal Medicine

## 2011-02-22 LAB — CREATININE WITH EST GFR
Creat: 0.79 mg/dL (ref 0.50–1.10)
GFR, Est African American: >60 mL/min (ref >60)
GFR, Est Non African American: >60 mL/min (ref >60)

## 2011-02-27 ENCOUNTER — Ambulatory Visit (HOSPITAL_COMMUNITY)
Admission: RE | Admit: 2011-02-27 | Discharge: 2011-02-27 | Disposition: A | Discharge status: Home / Self Care | Payer: Managed Care, Other (non HMO) | Source: Ambulatory Visit | Attending: Interventional Radiology | Admitting: Interventional Radiology

## 2011-02-27 ENCOUNTER — Other Ambulatory Visit (HOSPITAL_COMMUNITY): Payer: Self-pay | Admitting: Interventional Radiology

## 2011-02-27 DIAGNOSIS — N2889 Other specified disorders of kidney and ureter: Secondary | ICD-10-CM

## 2011-02-27 DIAGNOSIS — R209 Unspecified disturbances of skin sensation: Secondary | ICD-10-CM | POA: Insufficient documentation

## 2011-02-27 DIAGNOSIS — R1031 Right lower quadrant pain: Secondary | ICD-10-CM | POA: Insufficient documentation

## 2011-02-27 DIAGNOSIS — N289 Disorder of kidney and ureter, unspecified: Secondary | ICD-10-CM | POA: Insufficient documentation

## 2011-03-01 ENCOUNTER — Ambulatory Visit (INDEPENDENT_AMBULATORY_CARE_PROVIDER_SITE_OTHER): Payer: Managed Care, Other (non HMO) | Admitting: Internal Medicine

## 2011-03-01 ENCOUNTER — Encounter: Payer: Self-pay | Admitting: Internal Medicine

## 2011-03-01 DIAGNOSIS — B029 Zoster without complications: Secondary | ICD-10-CM

## 2011-03-01 DIAGNOSIS — R109 Unspecified abdominal pain: Secondary | ICD-10-CM

## 2011-03-01 DIAGNOSIS — C649 Malignant neoplasm of unspecified kidney, except renal pelvis: Secondary | ICD-10-CM

## 2011-03-01 MED ORDER — VALACYCLOVIR HCL 1 G PO TABS: 2000.0000 mg | ORAL_TABLET | Freq: Two times a day (BID) | ORAL | Status: AC

## 2011-03-01 MED ORDER — LIDOCAINE 5 % EX OINT: TOPICAL_OINTMENT | CUTANEOUS | Status: DC | PRN

## 2011-03-01 MED ORDER — GABAPENTIN 300 MG PO CAPS: 900.0000 mg | ORAL_CAPSULE | Freq: Three times a day (TID) | ORAL | Status: DC

## 2011-03-01 NOTE — Patient Instructions (Signed)
It was good to see you today. Will treat for possible shingles with Valtrex as discussed -  Also lidocaine ointment for skin and increase gabapentin for nerve pain (intercostal or neuropathy) Your prescription(s) have been submitted to your pharmacy. Please take as directed and contact our office if you believe you are having problem(s) with the medication(s).

## 2011-03-01 NOTE — Assessment & Plan Note (Signed)
Dx 11/2010 - eval by uro on same - imaging reports and interval hx reviewed - R cryoablation 02/16/2011 by IR as discussed above

## 2011-03-01 NOTE — Progress Notes (Signed)
Subjective:    Patient ID: Janet Le, female    DOB: Feb 27, 1956, 55 y.o.   MRN: [id number]  HPI complains of r flank pain and abdominal pain  Ongoing x 3 week - (? pain preceeds R renal cryoablation 2 weeks ago but much worse since procedure) New skin changes over right anterior abd over same area of pain - lumps and "red hives" intermittently present, none at this time Tender to touch and associated with itch - No back pain, no chest pain  Mild nausea but no vomiting or BM change No trauma or falls, no fever  Past Medical History  Diagnosis Date  . GERD (gastroesophageal reflux disease)   . Hypertension   . Chronic constipation   . Hiatal hernia   . Endometriosis   . Syncopal episodes     since childhood  . Chronic sinusitis   . Diabetes mellitus   . Osteoarthritis   . Hyperlipidemia   . Allergic rhinitis, cause unspecified   . Hemorrhoids   . Renal cell cancer 11/2010 dx    R cryoablation 02/16/11     Review of Systems  Genitourinary: Positive for flank pain. Negative for dysuria, hematuria and pelvic pain.  Skin: Positive for rash. Negative for wound.       Objective:   Physical Exam BP 132/72  Pulse 95  Temp(Src) 98.6 F (37 C) (Oral)  Ht 5\' 2"  (1.575 m)  SpO2 98% Constitutional: She is oriented to person, place, and time. She appears well-developed and well-nourished. Mild distress/discomfort. Dtr at side  Neck: Normal range of motion. Neck supple. No JVD present. No thyromegaly present.  Cardiovascular: Normal rate, regular rhythm and normal heart sounds.  No murmur heard. No BLE edema. Pulmonary/Chest: Effort normal and breath sounds normal. No respiratory distress. She has no wheezes.  Abdominal: obese; Soft. Bowel sounds are normal. She exhibits no distension. Tenderness over RLQ and along R flank to palp but no r/g. no masses Musculoskeletal: Normal range of motion, no joint effusions. No gross deformities Neurological: She is alert and oriented to  person, place, and time. No cranial nerve deficit. Coordination normal.  Skin: Skin is warm and dry. No calssic shingles rash noted but "textured" changes over painful dermatomal distribution. No erythema.  Psychiatric: She has a normal mood and affect. Her behavior is normal. Judgment and thought content normal.   Lab Results  Component Value Date   WBC 5.8 02/08/2011   HGB 12.2 02/08/2011   HCT 37.8 02/08/2011   PLT 154 02/08/2011   CHOL 205* 03/15/2010   TRIG 95.0 03/15/2010   HDL 53.40 03/15/2010   LDLDIRECT 140.5 03/15/2010   ALT 14 08/18/2010   AST 21 08/18/2010   NA 141 02/08/2011   K 3.4* 02/08/2011   CL 101 02/08/2011   CREATININE 0.85 02/08/2011   BUN 22 02/08/2011   CO2 32 02/08/2011   TSH 3.46 01/29/2011   INR 0.91 02/08/2011   HGBA1C 5.9 01/29/2011        Assessment & Plan:  RLQ and R flank pain - skin changes over same distribution - ?shingles or other viral outbreak - will tx empirically with valtrex and lidocain topical ointment at pt request By hx, pain seems neuropathic and likely related to recent R renal cryoablation 8/24 - IR procedure note and follow up 9/4 OV reviewed (repeat CT 9/4 unremarkable postop changes) Continue vicodin as per IR and increase gabapentin Pt understands plans for tx and will call if unimproved, sooner if worse -  Time spent with pt/family today 25 minutes, greater than 50% time spent counseling patient on shingles and medication review. Also review of records

## 2011-03-06 ENCOUNTER — Other Ambulatory Visit: Payer: Self-pay | Admitting: Interventional Radiology

## 2011-03-06 DIAGNOSIS — N2889 Other specified disorders of kidney and ureter: Secondary | ICD-10-CM

## 2011-03-08 ENCOUNTER — Other Ambulatory Visit: Payer: Self-pay | Admitting: Internal Medicine

## 2011-03-08 NOTE — Telephone Encounter (Signed)
Pt left vm requesting refill on her lidocaine oint.Marland KitchenMarland KitchenMarland Kitchen9/13/12@1 :55pm/LMB   Called pt to let her know rx already sent to pharmacy.Marland KitchenMarland KitchenMarland Kitchen9/13/12@1 :59pm/LMB

## 2011-03-12 ENCOUNTER — Telehealth: Payer: Self-pay | Admitting: Emergency Medicine

## 2011-03-26 ENCOUNTER — Other Ambulatory Visit: Payer: Self-pay | Admitting: Interventional Radiology

## 2011-03-27 LAB — COMPLETE METABOLIC PANEL WITH GFR
ALT: 15 U/L (ref 0–35)
AST: 21 U/L (ref 0–37)
Albumin: 4.1 g/dL (ref 3.5–5.2)
Alkaline Phosphatase: 94 U/L (ref 39–117)
BUN: 22 mg/dL (ref 6–23)
CO2: 31 mEq/L (ref 19–32)
Calcium: 10.1 mg/dL (ref 8.4–10.5)
Chloride: 105 mEq/L (ref 96–112)
Creat: 0.77 mg/dL (ref 0.50–1.10)
GFR, Est African American: >60 mL/min (ref >60)
GFR, Est Non African American: >60 mL/min (ref >60)
Glucose, Bld: 85 mg/dL (ref 70–99)
Potassium: 3.9 mEq/L (ref 3.5–5.3)
Sodium: 140 mEq/L (ref 135–145)
Total Bilirubin: 0.4 mg/dL (ref 0.3–1.2)
Total Protein: 7.3 g/dL (ref 6.0–8.3)

## 2011-03-28 ENCOUNTER — Other Ambulatory Visit: Payer: Managed Care, Other (non HMO)

## 2011-03-28 ENCOUNTER — Inpatient Hospital Stay (HOSPITAL_COMMUNITY)
Admission: RE | Admit: 2011-03-28 | Discharge: 2011-03-28 | Payer: Managed Care, Other (non HMO) | Source: Ambulatory Visit | Attending: Interventional Radiology | Admitting: Interventional Radiology

## 2011-04-02 ENCOUNTER — Telehealth: Payer: Self-pay | Admitting: *Deleted

## 2011-04-02 DIAGNOSIS — Z1239 Encounter for other screening for malignant neoplasm of breast: Secondary | ICD-10-CM

## 2011-04-02 NOTE — Telephone Encounter (Signed)
Pt is needing referral for [facility]ogram[facility]...04/02/11@2 :44pm/LMB

## 2011-04-02 NOTE — Telephone Encounter (Signed)
Pt already notified will be contacted by [facility] once mammo has been set-up

## 2011-04-02 NOTE — Telephone Encounter (Signed)
Order done - thx

## 2011-04-08 ENCOUNTER — Ambulatory Visit (HOSPITAL_BASED_OUTPATIENT_CLINIC_OR_DEPARTMENT_OTHER): Payer: Managed Care, Other (non HMO) | Attending: Otolaryngology

## 2011-04-08 DIAGNOSIS — G4730 Sleep apnea, unspecified: Secondary | ICD-10-CM | POA: Insufficient documentation

## 2011-04-08 DIAGNOSIS — I4949 Other premature depolarization: Secondary | ICD-10-CM | POA: Insufficient documentation

## 2011-04-08 DIAGNOSIS — G4710 Hypersomnia, unspecified: Secondary | ICD-10-CM | POA: Insufficient documentation

## 2011-04-08 DIAGNOSIS — R0609 Other forms of dyspnea: Secondary | ICD-10-CM | POA: Insufficient documentation

## 2011-04-08 DIAGNOSIS — R0989 Other specified symptoms and signs involving the circulatory and respiratory systems: Secondary | ICD-10-CM | POA: Insufficient documentation

## 2011-04-09 NOTE — Telephone Encounter (Signed)
SEE TELEPHONE NOTE

## 2011-04-11 ENCOUNTER — Other Ambulatory Visit: Payer: Self-pay | Admitting: Interventional Radiology

## 2011-04-11 ENCOUNTER — Ambulatory Visit
Admission: RE | Admit: 2011-04-11 | Discharge: 2011-04-11 | Disposition: A | Discharge status: Home / Self Care | Payer: Managed Care, Other (non HMO) | Source: Ambulatory Visit | Attending: Interventional Radiology | Admitting: Interventional Radiology

## 2011-04-11 ENCOUNTER — Ambulatory Visit (HOSPITAL_COMMUNITY)
Admission: RE | Admit: 2011-04-11 | Discharge: 2011-04-11 | Disposition: A | Discharge status: Home / Self Care | Payer: Managed Care, Other (non HMO) | Source: Ambulatory Visit | Attending: Interventional Radiology | Admitting: Interventional Radiology

## 2011-04-11 VITALS — BP 145/87 | HR 87 | Temp 97.6°F | Resp 15

## 2011-04-11 DIAGNOSIS — N200 Calculus of kidney: Secondary | ICD-10-CM | POA: Insufficient documentation

## 2011-04-11 DIAGNOSIS — R1031 Right lower quadrant pain: Secondary | ICD-10-CM | POA: Insufficient documentation

## 2011-04-11 DIAGNOSIS — N2889 Other specified disorders of kidney and ureter: Secondary | ICD-10-CM

## 2011-04-11 MED ORDER — IOHEXOL 300 MG/ML  SOLN
100.0000 mL | Freq: Once | INTRAMUSCULAR | Status: AC | PRN
  Administered 2011-04-11: 100 mL via INTRAVENOUS

## 2011-04-11 NOTE — Progress Notes (Signed)
Appetite good.  Denies n, v or diarrhea.  States that she has been experiencing right sided groin discomfort w/ radiation to right flank.  Taking ibuprofen 400 mg po prn.  Pt states that Dr. Evelene Croon started her Nuvigil 150 mg po for fatigue.  Pt also states that she has sleep apnea.

## 2011-04-14 DIAGNOSIS — R0609 Other forms of dyspnea: Secondary | ICD-10-CM

## 2011-04-14 DIAGNOSIS — G4730 Sleep apnea, unspecified: Secondary | ICD-10-CM

## 2011-04-14 DIAGNOSIS — G4710 Hypersomnia, unspecified: Secondary | ICD-10-CM

## 2011-04-14 DIAGNOSIS — R0989 Other specified symptoms and signs involving the circulatory and respiratory systems: Secondary | ICD-10-CM

## 2011-04-14 DIAGNOSIS — I4949 Other premature depolarization: Secondary | ICD-10-CM

## 2011-04-14 NOTE — Procedures (Signed)
NAMECHANTE, Janet Le              ACCOUNT NO.:  [id number]  MEDICAL RECORD NO.:  [id number]          PATIENT TYPE:  OUT  LOCATION:  SLEEP CENTER                 FACILITY:  [facility]  PHYSICIAN:  Clinton D. Maple Hudson, MD, FCCP, FACPDATE OF BIRTH:  10/24/55  DATE OF STUDY:  04/08/2011                           NOCTURNAL POLYSOMNOGRAM  REFERRING PHYSICIAN:  Jefry H. Pollyann Kennedy, MD  REFERRING PHYSICIAN:  Jefry H. Pollyann Kennedy, MD  INDICATION FOR STUDY:  Hypersomnia with sleep apnea.  EPWORTH SLEEPINESS SCORE:  35/24.  BMI 43.3, weight 237 pounds, height 62 inches, neck 15 inches.  HOME MEDICATIONS:  Charted and reviewed.  SLEEP ARCHITECTURE:  Total sleep time 361 minutes with sleep efficiency 89.2%.  Stage I was 7.3%, stage II 88.4%, stage III 0.1%, REM 4.2% of total sleep time.  Sleep latency 24 minutes, REM latency 291.5 minutes. Awake after sleep onset 20.5 minutes.  Arousal index 11.6.  BEDTIME MEDICATION:  None.  RESPIRATORY DATA:  Apnea-hypopnea index (AHI) 3.8 per hour.  A total of 23 events were scored including 6 obstructive apneas and 17 hypopneas. Events were seen in all sleep positions, particularly while supine and in REM.  REM AHI 8 per hour.  There were insufficient numbers of early events to permit application of CPAP titration by split protocol on the study night.  OXYGEN DATA:  Moderately loud snoring with oxygen desaturation to a nadir of 88% and a mean oxygen saturation through the study of 96.5% on room air.  CARDIAC DATA:  Frequent PVCs.  MOVEMENT-PARASOMNIA:  No significant movement disturbance.  No bathroom trips.  IMPRESSION-RECOMMENDATION: 1. Occasional respiratory events, within normal limits.  Apnea-     hypopnea index 3.8 per hour (the normal range for adults is between     0 and 5 respiratory disturbance events per hour).  Moderately loud     snoring with oxygen     desaturation to a nadir of 88% and mean saturation through the     study of 96.5% on room  air. 2. Frequent premature ventricular contractions.     Clinton D. Maple Hudson, MD, [facility], FACP Diplomate, Biomedical engineer of Sleep Medicine Electronically Signed    CDY/MEDQ  D:  04/14/2011 09:19:26  T:  04/14/2011 09:33:48  Job:  [id number]

## 2011-04-22 ENCOUNTER — Other Ambulatory Visit: Payer: Self-pay | Admitting: Internal Medicine

## 2011-05-03 ENCOUNTER — Ambulatory Visit: Payer: Managed Care, Other (non HMO) | Admitting: Internal Medicine

## 2011-05-03 DIAGNOSIS — Z0289 Encounter for other administrative examinations: Secondary | ICD-10-CM

## 2011-05-10 ENCOUNTER — Other Ambulatory Visit: Payer: Self-pay | Admitting: *Deleted

## 2011-05-10 MED ORDER — MELOXICAM 15 MG PO TABS: 15.0000 mg | ORAL_TABLET | Freq: Every day | ORAL | Status: DC | PRN

## 2011-05-10 MED ORDER — TRIAMCINOLONE ACETONIDE 0.025 % EX CREA: 1.0000 "application " | TOPICAL_CREAM | Freq: Three times a day (TID) | CUTANEOUS | Status: DC

## 2011-05-10 NOTE — Telephone Encounter (Signed)
Requesting rx to be sent to [facility] on her triamcinolone cream and meloxicam. Notified pt rx sent to mail service...05/10/11@11 :54am/LMB

## 2011-05-14 ENCOUNTER — Other Ambulatory Visit (INDEPENDENT_AMBULATORY_CARE_PROVIDER_SITE_OTHER): Payer: Managed Care, Other (non HMO)

## 2011-05-14 ENCOUNTER — Encounter: Payer: Self-pay | Admitting: Internal Medicine

## 2011-05-14 ENCOUNTER — Ambulatory Visit (INDEPENDENT_AMBULATORY_CARE_PROVIDER_SITE_OTHER): Payer: Managed Care, Other (non HMO) | Admitting: Internal Medicine

## 2011-05-14 DIAGNOSIS — R7309 Other abnormal glucose: Secondary | ICD-10-CM

## 2011-05-14 DIAGNOSIS — I10 Essential (primary) hypertension: Secondary | ICD-10-CM

## 2011-05-14 DIAGNOSIS — C649 Malignant neoplasm of unspecified kidney, except renal pelvis: Secondary | ICD-10-CM

## 2011-05-14 LAB — HEMOGLOBIN A1C: Hgb A1c MFr Bld: 5.9 % (ref 4.6–6.5)

## 2011-05-14 NOTE — Progress Notes (Signed)
Subjective:    Patient ID: Janet Le, female    DOB: [birth year], 55 y.o.   MRN: [id number]  HPI here for follow up - reviewed chronic medical issues:  R RCC dx 11/2010 - working with uro on same - s/p IR cryoablation 02/16/11  Anxiety - associated with panic attacks and chest pain  - Precipitated by stress - family and work, medial illness Stopped sertraline 11/2010 due to ?side effects  (slurring speech) - working with dr. Evelene Croon on same Started cymbalta - unsure if its working  obesity - working hard with meal replacement and exercise to lose weight - feels clothes are fitting better - originall size 20 - down 19 lbs since 09/2009 - highest weight 03/2009 - 252# - prev on diethylpropion 06/2010 - stopped with onset of panic attacks 10/2010  seasonal allg - worse during spring season - prefers claritin to Careers adviser for symptoms control - uses nasal spray most days no fever or sinus pressure or pain - no HA or cough or ST  GERD - reports indigestion better if taking ppi meds - no pain or reflux or change in BM -100% med compliance  HTN - reports compliance with ongoing medical treatment and no changes in medication dose or frequency. denies adverse side effects related to current therapy. no increase in edema or HA  diet controlled DM - watching carbs and losing weight efforts as above - take cinnamon but no med rx - does not check home cbg regularly  OA - knee and back - on delay for any surg in [place] - ongoing water aerobics therapy - no ankle or heel pain - no popping or new problems reported  Past Medical History  Diagnosis Date  . GERD (gastroesophageal reflux disease)   . Hypertension   . Chronic constipation   . Hiatal hernia   . Endometriosis   . Syncopal episodes     since childhood  . Chronic sinusitis   . Diabetes mellitus   . Osteoarthritis   . Hyperlipidemia   . Allergic rhinitis, cause unspecified   . Hemorrhoids   . Renal cell cancer 11/2010 dx    R cryoablation  02/16/11    Review of Systems  Constitutional: Negative for fever.  Respiratory: Negative for cough and wheezing.   Cardiovascular: Negative for leg swelling.  Genitourinary: Negative for dysuria.  Neurological: Negative for syncope and headaches.       Objective:   Physical Exam  BP 128/90  Pulse 102  Temp(Src) 98.5 F (36.9 C) (Oral)  SpO2 99% Wt Readings from Last 3 Encounters:  01/29/11 228 lb 6.4 oz (103.602 kg)  01/10/11 224 lb (101.606 kg)  11/14/10 224 lb (101.606 kg)    Constitutional: She is overweight but appears well-developed and well-nourished. No distress.  Neck: Normal range of motion. Neck supple. No JVD present. No thyromegaly present.  Cardiovascular: Normal rate, regular rhythm and normal heart sounds.  No murmur heard. chronic 2+ edema Pulmonary/Chest: Effort normal and breath sounds normal. No respiratory distress. She has no wheezes.   Skin: Skin is warm and dry. No rash noted. No erythema.  Psychiatric: She has anxious mood and affect. Her behavior is normal. Judgment and thought content normal.   Lab Results  Component Value Date   WBC 5.8 02/08/2011   HGB 12.2 02/08/2011   HCT 37.8 02/08/2011   PLT 154 02/08/2011   CHOL 205* 03/15/2010   TRIG 95.0 03/15/2010   HDL 53.40 03/15/2010   LDLDIRECT 140.5  03/15/2010   ALT 15 03/26/2011   AST 21 03/26/2011   NA 140 03/26/2011   K 3.9 03/26/2011   CL 105 03/26/2011   CREATININE 0.77 03/26/2011   BUN 22 03/26/2011   CO2 31 03/26/2011   TSH 3.46 01/29/2011   INR 0.91 02/08/2011   HGBA1C 5.9 01/29/2011        Assessment & Plan:   See problem list. Medications and labs reviewed today.

## 2011-05-14 NOTE — Assessment & Plan Note (Signed)
Dx 11/2010 - eval by uro on same - imaging reports and interval hx reviewed - R cryoablation 02/16/2011 by IR  Stable at this time

## 2011-05-14 NOTE — Patient Instructions (Signed)
It was good to see you today. Test(s) ordered today. Your results will be called to you after review (48-72hours after test completion). If any changes need to be made, you will be notified at that time. We have reviewed your prior records including labs and tests today Call [phone] and ask for information about the bariatric clinic and informational meetings Please schedule followup in 4 months for diabetes mellitus and blood pressure check, call sooner if problems.

## 2011-05-14 NOTE — Assessment & Plan Note (Signed)
BP Readings from Last 3 Encounters:  05/14/11 128/90  04/11/11 145/87  03/01/11 132/72   The current medical regimen is effective;  continue present plan and medications.

## 2011-05-14 NOTE — Assessment & Plan Note (Signed)
Diet controlled (take cinnamon herbal tx) Check a1c now Lab Results  Component Value Date   HGBA1C 5.9 01/29/2011    

## 2011-07-10 ENCOUNTER — Other Ambulatory Visit: Payer: Self-pay | Admitting: Interventional Radiology

## 2011-07-10 DIAGNOSIS — N2889 Other specified disorders of kidney and ureter: Secondary | ICD-10-CM

## 2011-07-21 ENCOUNTER — Other Ambulatory Visit: Payer: Self-pay | Admitting: Internal Medicine

## 2011-07-24 ENCOUNTER — Other Ambulatory Visit: Payer: Self-pay | Admitting: Radiology

## 2011-08-08 ENCOUNTER — Other Ambulatory Visit: Payer: Self-pay | Admitting: Interventional Radiology

## 2011-08-08 LAB — CREATININE WITH EST GFR
Creat: 0.97 mg/dL (ref 0.50–1.10)
GFR, Est African American: 76 mL/min
GFR, Est Non African American: 65 mL/min

## 2011-08-08 LAB — BUN: BUN: 25 mg/dL — ABNORMAL HIGH (ref 6–23)

## 2011-08-21 ENCOUNTER — Ambulatory Visit (HOSPITAL_COMMUNITY)
Admission: RE | Admit: 2011-08-21 | Discharge: 2011-08-21 | Disposition: A | Discharge status: Home / Self Care | Payer: BC Managed Care – PPO | Source: Ambulatory Visit | Attending: Interventional Radiology | Admitting: Interventional Radiology

## 2011-08-21 ENCOUNTER — Ambulatory Visit
Admission: RE | Admit: 2011-08-21 | Discharge: 2011-08-21 | Disposition: A | Discharge status: Home / Self Care | Payer: BC Managed Care – PPO | Source: Ambulatory Visit | Attending: Interventional Radiology | Admitting: Interventional Radiology

## 2011-08-21 DIAGNOSIS — N2889 Other specified disorders of kidney and ureter: Secondary | ICD-10-CM

## 2011-08-21 DIAGNOSIS — N289 Disorder of kidney and ureter, unspecified: Secondary | ICD-10-CM | POA: Insufficient documentation

## 2011-08-21 MED ORDER — IOHEXOL 300 MG/ML  SOLN
100.0000 mL | Freq: Once | INTRAMUSCULAR | Status: AC | PRN
  Administered 2011-08-21: 100 mL via INTRAVENOUS

## 2011-09-10 LAB — BASIC METABOLIC PANEL
BUN: 22 mg/dL — AB (ref 4–21)
Creatinine: 0.9 mg/dL (ref 0.5–1.1)
Glucose: 84 mg/dL
Potassium: 3.4 mmol/L (ref 3.4–5.3)
Sodium: 141 mmol/L (ref 137–147)

## 2011-09-10 LAB — HEPATIC FUNCTION PANEL
ALT: 13 U/L (ref 7–35)
AST: 20 U/L (ref 13–35)
Alkaline Phosphatase: 103 U/L (ref 25–125)
Bilirubin, Total: 0.2 mg/dL

## 2011-09-10 LAB — CBC AND DIFFERENTIAL
HCT: 39 % (ref 36–46)
Hemoglobin: 13 g/dL (ref 12.0–16.0)
Platelets: 135 10*3/uL — AB (ref 150–399)
WBC: 5.7 10^3/mL

## 2011-09-10 LAB — HEMOGLOBIN A1C: Hgb A1c MFr Bld: 5.5 % (ref 4.0–6.0)

## 2011-09-10 LAB — TSH: TSH: 2.55 u[IU]/mL (ref 0.41–5.90)

## 2011-09-14 ENCOUNTER — Ambulatory Visit: Payer: Managed Care, Other (non HMO) | Admitting: Internal Medicine

## 2011-09-25 ENCOUNTER — Telehealth: Payer: Self-pay | Admitting: *Deleted

## 2011-09-25 NOTE — Telephone Encounter (Signed)
Message copied by Leonette Monarch on Tue Sep 25, 2011  9:18 AM ------      Message from: Harlow Mares D      Created: Thu Sep 28, 2010  9:01 AM       Needs repeat b12 level

## 2011-09-25 NOTE — Telephone Encounter (Signed)
Pt states that she has already had her labs done with another MD and she will have them faxed over to Dr Jarold Motto. I gave her the fax number. She would also like to make a follow up appt for her yearly visit I have transferred her to [facility] to make that appt.

## 2011-10-15 ENCOUNTER — Telehealth: Payer: Self-pay | Admitting: Internal Medicine

## 2011-10-15 ENCOUNTER — Other Ambulatory Visit: Payer: Self-pay | Admitting: Internal Medicine

## 2011-10-15 MED ORDER — TRIAMCINOLONE ACETONIDE 0.025 % EX CREA: 1.0000 "application " | TOPICAL_CREAM | Freq: Three times a day (TID) | CUTANEOUS | Status: DC

## 2011-10-15 NOTE — Telephone Encounter (Signed)
Sent renewal on miralax. Called pt she also needing renewal on her cream also inform pt will send refill to pharmacy... 10/15/11@12 :45pm/LMB

## 2011-10-15 NOTE — Telephone Encounter (Signed)
The patient called and is hoping to get a rx called in for constipation.  I would be happy to sch her an ov if need.  Thanks!

## 2011-10-15 NOTE — Telephone Encounter (Signed)
Pt should contact GI for advisement

## 2011-10-18 ENCOUNTER — Encounter: Payer: Self-pay | Admitting: *Deleted

## 2011-10-22 ENCOUNTER — Other Ambulatory Visit: Payer: Self-pay | Admitting: Internal Medicine

## 2011-10-23 ENCOUNTER — Encounter: Payer: Self-pay | Admitting: Gastroenterology

## 2011-10-23 ENCOUNTER — Ambulatory Visit (INDEPENDENT_AMBULATORY_CARE_PROVIDER_SITE_OTHER): Payer: BC Managed Care – PPO | Admitting: Gastroenterology

## 2011-10-23 ENCOUNTER — Other Ambulatory Visit: Payer: BC Managed Care – PPO

## 2011-10-23 VITALS — BP 138/90 | HR 64 | Ht 62.0 in | Wt 245.2 lb

## 2011-10-23 DIAGNOSIS — K219 Gastro-esophageal reflux disease without esophagitis: Secondary | ICD-10-CM

## 2011-10-23 DIAGNOSIS — K625 Hemorrhage of anus and rectum: Secondary | ICD-10-CM

## 2011-10-23 DIAGNOSIS — R141 Gas pain: Secondary | ICD-10-CM

## 2011-10-23 DIAGNOSIS — K5900 Constipation, unspecified: Secondary | ICD-10-CM

## 2011-10-23 MED ORDER — LINACLOTIDE 290 MCG PO CAPS: 1.0000 | ORAL_CAPSULE | Freq: Every day | ORAL | Status: DC

## 2011-10-23 MED ORDER — LIDOCAINE-HYDROCORTISONE ACE 3-0.5 % RE CREA: 1.0000 | TOPICAL_CREAM | Freq: Two times a day (BID) | RECTAL | Status: DC

## 2011-10-23 MED ORDER — ANALPRAM E 2.5-1 & 1 % RE KIT: 1.0000 "application " | PACK | RECTAL | Status: DC | PRN

## 2011-10-23 NOTE — Progress Notes (Signed)
This is a complex 56 year old African American female with multiple medical problems and multiple drug allergies. She has chronic acid reflux managed with Dexilant 60 mg a day. Last endoscopy was in 2004. She currently describes postprandial gas and bloating and worsening constipation with intermittent hemorrhoidal bleeding. Colonoscopy one year ago was unremarkable except for mixed hemorrhoids. Her abdominal pain currently is in the mid abdomen and associated with gas, bloating, excessive flatus. She does have known lactose intolerance, and avoids these foods. She denies use of sorbitol fructose. She has a parotid psychiatric problems and is on Xanax, Adderall, hydrocodone, and probiotics. Recent labs were reviewed from primary care and show a normal CBC, metabolic profile, and anemia profile. Sedimentation rate was 6 and rheumatoid factor was negative hemoglobin A1c was 5.5, urinalysis was normal.  Current Medications, Allergies, Past Medical History, Past Surgical History, Family History and Social History were reviewed in [employer] record.  Pertinent Review of Systems Negative   Physical Exam: Blood pressure 130/90, pulse 64 and regular, weight 245 pounds, BMI 44.85. I cannot appreciate stigmata of chronic liver disease. Her abdomen is nondistended without organomegaly, masses or tenderness. Bowel sounds are nonobstructed. Rectal exam is deferred. Mental status is normal.    Assessment and Plan: Constipation predominant IBS with chronic GERD well controlled on daily PPI therapy. We will repeat her endoscopy which has not been done in 10 years, also check biopsies for celiac disease. We will try Linzess 290 mg a day for constipation which is in part related to her chronic narcotic use. She is up-to-date on her colonoscopy exams, and had recent negative CT scan of the abdomen that was reviewed. Also previous esophageal manometry was reviewed and was normal. Encounter Diagnoses    Name Primary?  . Esophageal reflux Yes  . Hemorrhage of rectum and anus   . Constipation

## 2011-10-23 NOTE — Patient Instructions (Signed)
Your procedure has been scheduled for 11/09/2011, please follow the seperate instructions.  Your prescription(s) have been sent to you pharmacy, analpram, anamantel, Linzess. Please go to the basement today for your labs.

## 2011-10-25 LAB — CELIAC PANEL 10
Endomysial Screen: NEGATIVE
Gliadin IgA: 4.3 U/mL (ref <20)
Gliadin IgG: 5.9 U/mL (ref <20)
IgA: 115 mg/dL (ref 69–380)
Tissue Transglut Ab: 4.7 U/mL (ref <20)
Tissue Transglutaminase Ab, IgA: 3.2 U/mL (ref <20)

## 2011-10-26 ENCOUNTER — Telehealth: Payer: Self-pay | Admitting: *Deleted

## 2011-10-26 ENCOUNTER — Encounter: Payer: Self-pay | Admitting: Internal Medicine

## 2011-10-26 DIAGNOSIS — E669 Obesity, unspecified: Secondary | ICD-10-CM

## 2011-10-26 DIAGNOSIS — E119 Type 2 diabetes mellitus without complications: Secondary | ICD-10-CM

## 2011-10-26 NOTE — Telephone Encounter (Signed)
Pt drop off labs from another md want to know if she need to continue checking BS and can she get a referral for weight management. Per md no need to check BS glucose was good A1C 5.5, also md ok referral for weight management. Inform pt once appt has been set up will received call back from [facility] with appt, date, and time. Also entered labs... 10/26/11@10 :00am/LMB

## 2011-11-09 ENCOUNTER — Encounter: Payer: BC Managed Care – PPO | Admitting: Gastroenterology

## 2011-11-14 ENCOUNTER — Encounter: Payer: BC Managed Care – PPO | Admitting: Gastroenterology

## 2012-01-24 ENCOUNTER — Other Ambulatory Visit: Payer: Self-pay | Admitting: Interventional Radiology

## 2012-01-24 DIAGNOSIS — N2889 Other specified disorders of kidney and ureter: Secondary | ICD-10-CM

## 2012-02-20 ENCOUNTER — Other Ambulatory Visit: Payer: Self-pay | Admitting: Emergency Medicine

## 2012-02-20 DIAGNOSIS — N2889 Other specified disorders of kidney and ureter: Secondary | ICD-10-CM

## 2012-03-06 ENCOUNTER — Inpatient Hospital Stay (HOSPITAL_COMMUNITY)
Admission: RE | Admit: 2012-03-06 | Discharge: 2012-03-06 | Payer: BC Managed Care – PPO | Source: Ambulatory Visit | Attending: Interventional Radiology | Admitting: Interventional Radiology

## 2012-03-06 ENCOUNTER — Other Ambulatory Visit: Payer: BC Managed Care – PPO

## 2012-04-08 ENCOUNTER — Telehealth: Payer: Self-pay | Admitting: Internal Medicine

## 2012-04-08 ENCOUNTER — Encounter (HOSPITAL_COMMUNITY): Payer: Self-pay | Admitting: *Deleted

## 2012-04-08 ENCOUNTER — Emergency Department (HOSPITAL_COMMUNITY)
Admission: EM | Admit: 2012-04-08 | Discharge: 2012-04-08 | Disposition: A | Discharge status: Home / Self Care | Payer: BC Managed Care – PPO | Source: Home / Self Care | Attending: Emergency Medicine | Admitting: Emergency Medicine

## 2012-04-08 DIAGNOSIS — G43909 Migraine, unspecified, not intractable, without status migrainosus: Secondary | ICD-10-CM

## 2012-04-08 MED ORDER — PROMETHAZINE HCL 25 MG PO TABS: 25.0000 mg | ORAL_TABLET | Freq: Four times a day (QID) | ORAL | Status: DC | PRN

## 2012-04-08 MED ORDER — TRAMADOL HCL 50 MG PO TABS: 50.0000 mg | ORAL_TABLET | ORAL | Status: DC | PRN

## 2012-04-08 NOTE — Telephone Encounter (Signed)
Noted thanks °

## 2012-04-08 NOTE — ED Notes (Signed)
Pt  Woke  Up  With  A  Headache  With  Nausea         With  Hot  Flashes     Also has  Pain  Behind l   Leg          Symptoms   Began  This  Am

## 2012-04-08 NOTE — ED Provider Notes (Signed)
Medical screening examination/treatment/procedure(s) were performed by a resident physician and as supervising physician I was immediately available for consultation/collaboration.  Additionally, I saw the patient independently, verified the history, examined the patient and discussed the treatment plan with the resident.  Leslee Home, M.D.    Reuben Likes, MD 04/08/12 2200

## 2012-04-08 NOTE — Telephone Encounter (Signed)
Reason for call: BP 170/106, migraine headache. Pt is currently at [facility]. She decides to be seen at [facility] after triage since she is there. HTN: Diagnosed Protocol.

## 2012-04-08 NOTE — ED Notes (Signed)
Pt  States  Has  Not  Taken  Any  Of  Her  meds  For  About  One  Month  Except  For a  diovan taken  Today

## 2012-04-08 NOTE — ED Provider Notes (Signed)
History     CSN: [id number]  Arrival date & time 04/08/12  1334   First MD Initiated Contact with Patient 04/08/12 1420      Chief Complaint  Patient presents with  . Headache    (Consider location/radiation/quality/duration/timing/severity/associated sxs/prior treatment) HPI Pt reports headache that began this morning upon waking up.  Was initially located in top of head but has now migrated to bilateral temple region and forehead.  Is throbbing in quality and associated with nausea and some photophobia.  She has not had any emesis.  She has a history of migraine headaches but has not had one in over a year.  She reports some intermittent numbness in her left leg which has been present intermittently for the past two weeks but otherwise denies any numbness, tingling, gait disturbance, or problems with speech.  Past Medical History  Diagnosis Date  . GERD (gastroesophageal reflux disease)   . Hypertension   . Chronic constipation   . Hiatal hernia   . Endometriosis   . Syncopal episodes     since childhood  . Chronic sinusitis   . Diabetes mellitus   . Osteoarthritis   . Hyperlipidemia   . Allergic rhinitis, cause unspecified   . Hemorrhoids   . Renal cell cancer 11/2010 dx    R cryoablation 02/16/11  . Irritable bowel syndrome   . Hemorrhoids     Past Surgical History  Procedure Date  . Breast surgery     bilateral  . Tubal ligation   . Nissen fundoplication   . Laser ablation of the cervix   . Functional endoscopic sinus surgery   . Fallopian tube removed   . Coronary artery bypass graft   . Renal cryoablation 02/16/11    R kidney due to RCC (IR procedure)    Family History  Problem Relation Age of Onset  . Diabetes Mother   . Kidney disease Father   . Prostate cancer Father   . Colitis Brother     History  Substance Use Topics  . Smoking status: Never Smoker   . Smokeless tobacco: Never Used  . Alcohol Use: No    OB History    Grav Para Term  Preterm Abortions TAB SAB Ect Mult Living                  Review of Systems  Constitutional: Negative for fever, chills and activity change.  HENT: Negative for facial swelling, neck pain, neck stiffness and voice change.   Eyes: Positive for photophobia. Negative for pain, discharge, redness, itching and visual disturbance.  Respiratory: Negative for chest tightness and shortness of breath.   Cardiovascular: Negative for chest pain.  Gastrointestinal: Negative for abdominal pain.  Musculoskeletal: Negative for joint swelling.       Intermittent left leg numbness/tingling  Neurological: Positive for headaches. Negative for dizziness, syncope, speech difficulty, weakness and numbness.    Allergies  Codeine; Darifenacin hydrobromide er; Gabapentin; Propoxyphene hcl; Sertraline hcl; and Wellbutrin  Home Medications   Current Outpatient Rx  Name Route Sig Dispense Refill  . ACCU-CHEK SOFTCLIX LANCETS MISC Other by Other route. Use twice a day for blood sugar     . ALPRAZOLAM 0.5 MG PO TABS Oral Take 1 tablet (0.5 mg total) by mouth 3 (three) times daily as needed for anxiety. 30 tablet 0  . AMPHETAMINE-DEXTROAMPHET ER 30 MG PO CP24 Oral Take 1 capsule (30 mg total) by mouth every morning. Take 2 once daily    .  ALIGN PO CAPS Oral Take 1 capsule by mouth daily. 30 capsule 11  . CALCIUM CARBONATE-VITAMIN D 600-100 MG-UNIT PO CAPS Oral Take by mouth. Take 4 once daily     . DEXILANT 60 MG PO CPDR  TAKE 1 CAPSULE EVERY MORNING 90 capsule 0  . DHA-EPA-VITAMIN E 192-251-11 MG-MG-UNIT PO CAPS Oral Take by mouth. Take 2 capsule by mouth daily     . DULOXETINE HCL 60 MG PO CPEP Oral Take 60 mg by mouth daily.      [name] FLUTICASONE PROPIONATE 50 MCG/ACT NA SUSP Nasal 1 spray by Nasal route daily.      . FUROSEMIDE 20 MG PO TABS  TAKE 2 TABLETS EVERY OTHER DAY OR AS DIRECTED 180 tablet 0  . GLUCOSE BLOOD VI STRP Other 1 each by Other route daily. Check blood sugar 1-2 times a day     .  HYDROCODONE-ACETAMINOPHEN 5-500 MG PO TABS Oral Take 1 tablet by mouth every 6 (six) hours as needed.      Adron Bene E 2.5-1 & 1 % RE KIT Rectal Place 1 application rectally as needed. 1 kit 1  . IBUPROFEN 800 MG PO TABS Oral Take 800 mg by mouth 3 (three) times daily.      [name] LIDOCAINE 5 % EX OINT  APPLY TOPICALLY AS NEEDED. 35.44 g 1    NEED MORE REFILLS  . LIDOCAINE-HYDROCORTISONE ACE 3-0.5 % RE CREA Rectal Place 1 Applicatorful rectally 2 (two) times daily. Apply two times a day to rectum 28.35 g 3  . LINACLOTIDE 290 MCG PO CAPS Oral Take 1 capsule by mouth daily. 30 capsule 3  . LORATADINE 10 MG PO TABS  TAKE 1 TABLET BY MOUTH EVERY EVENING 30 tablet 5  . LORATADINE 10 MG PO CAPS Oral Take 10 mg by mouth every evening.      [name] MELOXICAM 15 MG PO TABS Oral Take 1 tablet (15 mg total) by mouth daily as needed. 90 tablet 1  . PROMETHAZINE HCL 25 MG PO TABS Oral Take 1 tablet (25 mg total) by mouth every 6 (six) hours as needed for nausea. 30 tablet 0  . TRAMADOL HCL 50 MG PO TABS Oral Take 1 tablet (50 mg total) by mouth every 4 (four) hours as needed for pain. 30 tablet 0  . VALSARTAN-HYDROCHLOROTHIAZIDE 320-25 MG PO TABS  TAKE 1 TABLET DAILY 90 tablet 0    BP 158/86  Pulse 72  Temp 98.6 F (37 C) (Oral)  Resp 18  SpO2 100%  Physical Exam  Constitutional: She is oriented to person, place, and time. She appears well-developed and well-nourished.  HENT:  Head: Normocephalic and atraumatic.  Right Ear: External ear normal.  Left Ear: External ear normal.  Mouth/Throat: Oropharynx is clear and moist.  Eyes: Conjunctivae normal and EOM are normal. Pupils are equal, round, and reactive to light.  Neck: Normal range of motion. Neck supple.  Cardiovascular: Normal rate, regular rhythm and normal heart sounds.   Pulmonary/Chest: Effort normal and breath sounds normal.  Abdominal: Soft. Bowel sounds are normal.  Neurological: She is alert and oriented to person, place, and time. No cranial  nerve deficit. Coordination normal.    ED Course  Procedures (including critical care time)  Labs Reviewed - No data to display No results found.   1. Migraine       MDM  Atypical migraine.  No red flags.  Will treat symptomatically.  Rec f/u with PCP about blood pressure.  Will treat  with tramadol and phenergan as patient has HTN so NSAIDs and imitrex are not indicated.  As patient will be driving herself, compazine, benadryl, or opioids are also not an option.        Brent Bulla, MD 04/08/12 [phone]

## 2012-04-09 ENCOUNTER — Ambulatory Visit (INDEPENDENT_AMBULATORY_CARE_PROVIDER_SITE_OTHER): Payer: BC Managed Care – PPO | Admitting: Internal Medicine

## 2012-04-09 ENCOUNTER — Encounter: Payer: Self-pay | Admitting: Internal Medicine

## 2012-04-09 ENCOUNTER — Telehealth: Payer: Self-pay

## 2012-04-09 VITALS — BP 140/82 | HR 81 | Temp 97.1°F | Ht 62.0 in | Wt 248.0 lb

## 2012-04-09 DIAGNOSIS — IMO0002 Reserved for concepts with insufficient information to code with codable children: Secondary | ICD-10-CM

## 2012-04-09 DIAGNOSIS — I10 Essential (primary) hypertension: Secondary | ICD-10-CM

## 2012-04-09 DIAGNOSIS — M5410 Radiculopathy, site unspecified: Secondary | ICD-10-CM

## 2012-04-09 DIAGNOSIS — Z23 Encounter for immunization: Secondary | ICD-10-CM

## 2012-04-09 DIAGNOSIS — G43909 Migraine, unspecified, not intractable, without status migrainosus: Secondary | ICD-10-CM

## 2012-04-09 DIAGNOSIS — M5416 Radiculopathy, lumbar region: Secondary | ICD-10-CM

## 2012-04-09 MED ORDER — IRBESARTAN-HYDROCHLOROTHIAZIDE 300-12.5 MG PO TABS: 1.0000 | ORAL_TABLET | Freq: Every day | ORAL | Status: DC

## 2012-04-09 MED ORDER — KETOROLAC TROMETHAMINE 30 MG/ML IJ SOLN
30.0000 mg | Freq: Once | INTRAMUSCULAR | Status: AC
  Administered 2012-04-09: 30 mg via INTRAMUSCULAR

## 2012-04-09 MED ORDER — ONDANSETRON HCL 8 MG PO TABS: ORAL_TABLET | ORAL | Status: DC

## 2012-04-09 MED ORDER — PREDNISONE 10 MG PO TABS: ORAL_TABLET | ORAL | Status: DC

## 2012-04-09 MED ORDER — IRBESARTAN-HYDROCHLOROTHIAZIDE 300-25 MG PO TABS: 1.0000 | ORAL_TABLET | Freq: Every day | ORAL | Status: DC

## 2012-04-09 NOTE — Telephone Encounter (Signed)
Pharmacy requesting clarification on irbesartan hydrochlorothiazide.  They do not make 300-25 they make a 300- 12.5 can they change

## 2012-04-09 NOTE — Patient Instructions (Addendum)
You had the pain shot today (toradol, 30 mg) Take all new medications as prescribed  - the Benicar HCT samples at 20/12.5 mg per day, then the prescription for avalide 300/25 mg per day; and also the prednisone for the lower back pain Continue all other medications as before, including the tramadol, but Not the phenergan from the ER since you felt funny with that You had the flu shot today You are given the work note for yesterday and today You will be contacted regarding the referral for: Dr Retia Passe for the back and leg pain You are also given the samples of the linzess today Please return in 6 weeks to f/u with Dr Felicity Coyer

## 2012-04-09 NOTE — Assessment & Plan Note (Addendum)
Pt with marked financial difficulty - gave 4 wks samples benicarHCT 20/12.5 qd, and rx for avalide 300/25 (closest to diovan HCT previous)

## 2012-04-09 NOTE — Telephone Encounter (Signed)
rx changed - done erx 

## 2012-04-11 ENCOUNTER — Telehealth: Payer: Self-pay | Admitting: Internal Medicine

## 2012-04-11 MED ORDER — DEXLANSOPRAZOLE 60 MG PO CPDR: 60.0000 mg | DELAYED_RELEASE_CAPSULE | Freq: Every day | ORAL | Status: DC

## 2012-04-11 NOTE — Telephone Encounter (Signed)
Assistance please Janet Le, DP patient, thx

## 2012-04-13 ENCOUNTER — Encounter: Payer: Self-pay | Admitting: Internal Medicine

## 2012-04-13 DIAGNOSIS — M5416 Radiculopathy, lumbar region: Secondary | ICD-10-CM

## 2012-04-13 HISTORY — DX: Radiculopathy, lumbar region: M54.16

## 2012-04-13 NOTE — Assessment & Plan Note (Addendum)
Mild to mod, for predpack asd, no motor defecit - consider MRI if worsens,  to f/u any worsening symptoms or concerns to Dr Carlota Raspberry for now

## 2012-04-13 NOTE — Progress Notes (Signed)
Subjective:    Patient ID: Janet Le, female    DOB: 16-Jan-1956, 56 y.o.   MRN: [id number]  HPI  Here to f/u; incidentally wit migraine today, typical for her with one sided throbbing with photophobia and nausea for 2-3 days not better with otc meds;  Needs change of diovan HCT due to cost.  Pt denies chest pain, increased sob or doe, wheezing, orthopnea, PND, increased LE swelling, palpitations, dizziness or syncope.  Pt denies new neurological symptoms such as new headache, or facial or extremity weakness or numbness   Pt denies polydipsia, polyuria.  Does also have 2-3 wks recurring left LBP without weakness but with some radiation of pain and tingling to distal LLE, but no bowel or bladder change, fever, wt loss, gait change or falls.   Pt denies fever, wt loss, night sweats, loss of appetite, or other constitutional symptoms Needs note for work, also for flu shot today.  States she often misses her meds to cost Past Medical History  Diagnosis Date  . GERD (gastroesophageal reflux disease)   . Hypertension   . Chronic constipation   . Hiatal hernia   . Endometriosis   . Syncopal episodes     since childhood  . Chronic sinusitis   . Diabetes mellitus   . Osteoarthritis   . Hyperlipidemia   . Allergic rhinitis, cause unspecified   . Hemorrhoids   . Renal cell cancer 11/2010 dx    R cryoablation 02/16/11  . Irritable bowel syndrome   . Hemorrhoids    Past Surgical History  Procedure Date  . Breast surgery     bilateral  . Tubal ligation   . Nissen fundoplication   . Laser ablation of the cervix   . Functional endoscopic sinus surgery   . Fallopian tube removed   . Coronary artery bypass graft   . Renal cryoablation 02/16/11    R kidney due to RCC (IR procedure)    reports that she has never smoked. She has never used smokeless tobacco. She reports that she does not drink alcohol or use illicit drugs. family history includes Colitis in her brother; Diabetes in her mother;  Kidney disease in her father; and Prostate cancer in her father. Allergies  Allergen Reactions  . Codeine   . Darifenacin Hydrobromide Er     Pt states made her feel queezy & drunk  . Gabapentin     Hallucinations  . Propoxyphene Hcl   . Sertraline Hcl Other (See Comments)    Spasms; numbness  . Wellbutrin (Bupropion Hcl) Other (See Comments)    Numbness of mouth/lips   Current Outpatient Prescriptions on File Prior to Visit  Medication Sig Dispense Refill  . ACCU-CHEK SOFTCLIX LANCETS lancets by Other route. Use twice a day for blood sugar       . ALPRAZolam (XANAX) 0.5 MG tablet Take 1 tablet (0.5 mg total) by mouth 3 (three) times daily as needed for anxiety.  30 tablet  0  . amphetamine-dextroamphetamine (ADDERALL XR, 30MG ,) 30 MG 24 hr capsule Take 1 capsule (30 mg total) by mouth every morning. Take 2 once daily      . bifidobacterium infantis (ALIGN) capsule Take 1 capsule by mouth daily.  30 capsule  11  . Calcium Carbonate-Vitamin D 600-100 MG-UNIT CAPS Take by mouth. Take 4 once daily       . dexlansoprazole (DEXILANT) 60 MG capsule Take 1 capsule (60 mg total) by mouth daily.  30 capsule  11  .  DHA-EPA-Vitamin E (OMEGA-3 COMPLEX) 192-251-11 MG-MG-UNIT CAPS Take by mouth. Take 2 capsule by mouth daily       . DULoxetine (CYMBALTA) 60 MG capsule Take 60 mg by mouth daily.        . fluticasone (FLONASE) 50 MCG/ACT nasal spray 1 spray by Nasal route daily.        . furosemide (LASIX) 20 MG tablet TAKE 2 TABLETS EVERY OTHER DAY OR AS DIRECTED  180 tablet  0  . glucose blood (ACCU-CHEK COMPACT TEST DRUM) test strip 1 each by Other route daily. Check blood sugar 1-2 times a day       . HYDROcodone-acetaminophen (VICODIN) 5-500 MG per tablet Take 1 tablet by mouth every 6 (six) hours as needed.        . Hydrocortisone Ace-Pramoxine (ANALPRAM E) 2.5-1 & 1 % KIT Place 1 application rectally as needed.  1 kit  1  . ibuprofen (ADVIL,MOTRIN) 800 MG tablet Take 800 mg by mouth 3 (three)  times daily.        [name] lidocaine (XYLOCAINE) 5 % ointment APPLY TOPICALLY AS NEEDED.  35.44 g  1  . lidocaine-hydrocortisone (ANAMANTEL HC) 3-0.5 % CREA Place 1 Applicatorful rectally 2 (two) times daily. Apply two times a day to rectum  28.35 g  3  . Linaclotide (LINZESS) 290 MCG CAPS Take 1 capsule by mouth daily.  30 capsule  3  . loratadine (CLARITIN) 10 MG tablet TAKE 1 TABLET BY MOUTH EVERY EVENING  30 tablet  5  . Loratadine 10 MG CAPS Take 10 mg by mouth every evening.        . meloxicam (MOBIC) 15 MG tablet Take 1 tablet (15 mg total) by mouth daily as needed.  90 tablet  1  . traMADol (ULTRAM) 50 MG tablet Take 1 tablet (50 mg total) by mouth every 4 (four) hours as needed for pain.  30 tablet  0   Review of Systems  Constitutional: Negative for diaphoresis and unexpected weight change.  HENT: Negative for tinnitus.   Eyes: Negative for photophobia and visual disturbance.  Respiratory: Negative for choking and stridor.   Gastrointestinal: Negative for vomiting and blood in stool.  Genitourinary: Negative for hematuria and decreased urine volume.  Musculoskeletal: Negative for gait problem.  Skin: Negative for color change and wound.  Neurological: Negative for tremors and numbness.  Psychiatric/Behavioral: Negative for decreased concentration. The patient is not hyperactive.       Objective:   Physical Exam BP 140/82  Pulse 81  Temp 97.1 F (36.2 C) (Oral)  Ht 5\' 2"  (1.575 m)  Wt 248 lb (112.492 kg)  BMI 45.36 kg/m2  SpO2 98% Physical Exam  VS noted Constitutional: Pt appears well-developed and well-nourished.  HENT: Head: Normocephalic.  Right Ear: External ear normal.  Left Ear: External ear normal.  Eyes: Conjunctivae and EOM are normal. Pupils are equal, round, and reactive to light.  Neck: Normal range of motion. Neck supple.  Cardiovascular: Normal rate and regular rhythm.   Pulmonary/Chest: Effort normal and breath sounds normal.  Abd:  Soft, NT,  non-distended, + BS Spine:  nontender Neurological: Pt is alert. Not confused , motor/gait/sens/dtr intact Skin: Skin is warm. No erythema.  Psychiatric: Pt behavior is normal. Thought content normal.     Assessment & Plan:

## 2012-04-13 NOTE — Assessment & Plan Note (Signed)
Mild to mod, for toradol and zofran prn  to f/u any worsening symptoms or concerns

## 2012-04-15 ENCOUNTER — Other Ambulatory Visit: Payer: Self-pay | Admitting: Internal Medicine

## 2012-04-15 DIAGNOSIS — K219 Gastro-esophageal reflux disease without esophagitis: Secondary | ICD-10-CM

## 2012-04-15 DIAGNOSIS — K625 Hemorrhage of anus and rectum: Secondary | ICD-10-CM

## 2012-04-15 MED ORDER — LORATADINE 10 MG PO TABS: ORAL_TABLET | ORAL | Status: DC

## 2012-04-15 MED ORDER — PREDNISONE 10 MG PO TABS: ORAL_TABLET | ORAL | Status: DC

## 2012-04-15 MED ORDER — FLUTICASONE PROPIONATE 50 MCG/ACT NA SUSP: 1.0000 | Freq: Every day | NASAL | Status: DC

## 2012-04-15 MED ORDER — LIDOCAINE 5 % EX OINT: TOPICAL_OINTMENT | CUTANEOUS | Status: DC

## 2012-04-15 MED ORDER — ANALPRAM E 2.5-1 & 1 % RE KIT: 1.0000 "application " | PACK | RECTAL | Status: DC | PRN

## 2012-04-15 NOTE — Telephone Encounter (Signed)
Rx's patient requested sent to [facility]. Pt wants to know if she needs another refill of Prednisone pac until her appointment with Dr. Retia Passe for her back/leg pain since she is still having pain. Please advise (seen by JWJ on 10/16)

## 2012-04-15 NOTE — Telephone Encounter (Signed)
The patient is hoping to get all of her prescriptions transferred to the [facility] on [address].   Her callback number [phone]

## 2012-04-15 NOTE — Telephone Encounter (Signed)
Rx for Pred pac sent to pharmacy, pt informed.

## 2012-04-15 NOTE — Telephone Encounter (Signed)
ok 

## 2012-04-22 ENCOUNTER — Telehealth: Payer: Self-pay | Admitting: *Deleted

## 2012-04-22 MED ORDER — FUROSEMIDE 20 MG PO TABS: 40.0000 mg | ORAL_TABLET | ORAL | Status: DC | PRN

## 2012-04-22 NOTE — Telephone Encounter (Signed)
Notified pt with md response. Pt states she will need rx sent to [facility]. Inform pt will send...lmb

## 2012-04-22 NOTE — Telephone Encounter (Signed)
Left msg on vm stating Dr. Jonny Ruiz gave her a new Bp med. Want to know does she need to continue taking the fluid pill as well...Raechel Chute

## 2012-04-22 NOTE — Telephone Encounter (Signed)
Ok to take furosemide only as needed for edema

## 2012-04-25 ENCOUNTER — Encounter: Payer: Self-pay | Admitting: Gastroenterology

## 2012-04-25 ENCOUNTER — Ambulatory Visit (INDEPENDENT_AMBULATORY_CARE_PROVIDER_SITE_OTHER): Payer: BC Managed Care – PPO | Admitting: Gastroenterology

## 2012-04-25 VITALS — BP 130/74 | HR 90 | Ht 62.0 in | Wt 246.6 lb

## 2012-04-25 DIAGNOSIS — K5901 Slow transit constipation: Secondary | ICD-10-CM

## 2012-04-25 DIAGNOSIS — Z8719 Personal history of other diseases of the digestive system: Secondary | ICD-10-CM

## 2012-04-25 NOTE — Progress Notes (Signed)
History of Present Illness: This is a 56 year old African American female doing well on Dexilant 60 mg a day for acid reflux. However, she cannot afford her medications and is seeking samples. Her constipation responded well to Linzess 290 mcg a day but she also cannot afford this medication. She is on multiple medications as multiple medical problems and multiple physicians.    Current Medications, Allergies, Past Medical History, Past Surgical History, Family History and Social History were reviewed in [employer] record.   Assessment and plan: Sample supplied a day for GI medications. I referred her to Horatio Pel at [facility] for help with her insurance coverage and medications. We will have nothing S. to offer this patient at this time. I have reiterated to her importance of health insurance so she can continue her medical management as needed. I do not think endoscopic evaluation is needed at this time.  Please copy her primary care physician, referring physician, and pertinent subspecialists.

## 2012-04-25 NOTE — Patient Instructions (Addendum)
You have been given samples of Linzess and Dexilant.  The phone number for Janet Le  is [phone]. She should be able to help with insurance problems.

## 2012-05-21 ENCOUNTER — Other Ambulatory Visit (INDEPENDENT_AMBULATORY_CARE_PROVIDER_SITE_OTHER): Payer: BC Managed Care – PPO

## 2012-05-21 ENCOUNTER — Ambulatory Visit (INDEPENDENT_AMBULATORY_CARE_PROVIDER_SITE_OTHER): Payer: BC Managed Care – PPO | Admitting: Internal Medicine

## 2012-05-21 ENCOUNTER — Encounter: Payer: Self-pay | Admitting: Internal Medicine

## 2012-05-21 VITALS — BP 140/80 | HR 100 | Temp 97.1°F | Resp 18 | Ht 62.0 in | Wt 250.0 lb

## 2012-05-21 DIAGNOSIS — R7309 Other abnormal glucose: Secondary | ICD-10-CM

## 2012-05-21 DIAGNOSIS — Z1231 Encounter for screening mammogram for malignant neoplasm of breast: Secondary | ICD-10-CM

## 2012-05-21 DIAGNOSIS — G43909 Migraine, unspecified, not intractable, without status migrainosus: Secondary | ICD-10-CM

## 2012-05-21 DIAGNOSIS — Z1239 Encounter for other screening for malignant neoplasm of breast: Secondary | ICD-10-CM

## 2012-05-21 DIAGNOSIS — I10 Essential (primary) hypertension: Secondary | ICD-10-CM

## 2012-05-21 LAB — HEMOGLOBIN A1C: Hgb A1c MFr Bld: 5.8 % (ref 4.6–6.5)

## 2012-05-21 MED ORDER — ENALAPRIL MALEATE 20 MG PO TABS: 20.0000 mg | ORAL_TABLET | Freq: Every day | ORAL | Status: DC

## 2012-05-21 MED ORDER — PROPRANOLOL HCL 80 MG PO TABS: 80.0000 mg | ORAL_TABLET | Freq: Two times a day (BID) | ORAL | Status: DC

## 2012-05-21 MED ORDER — AMITRIPTYLINE HCL 10 MG PO TABS: 10.0000 mg | ORAL_TABLET | Freq: Every day | ORAL | Status: DC

## 2012-05-21 MED ORDER — SUMATRIPTAN SUCCINATE 50 MG PO TABS: 50.0000 mg | ORAL_TABLET | Freq: Once | ORAL | Status: DC | PRN

## 2012-05-21 NOTE — Progress Notes (Signed)
  Subjective:    Patient ID: Janet Le, female    DOB: 08-23-1955, 56 y.o.   MRN: [id number]  HPI here for follow up - reviewed chronic medical issues:  R RCC dx 11/2010 - working with uro/IR on same - s/p IR cryoablation 02/16/11  Anxiety, chronic symptoms - exacerbated by family stressors re: guardianship of g-son symptoms associated with panic attacks and chest pain  - Stopped sertraline 11/2010 due to ?side effects  (slurring speech) -  Intermittently working with psyc dr. Evelene Croon on same -on cymbalta but unsure if its working  HTN - variable compliance use of cost concerns - no increase in edema or HA  diet controlled DM - watching carbs - take cinnamon but no med rx - does not check home cbg regularly   Past Medical History  Diagnosis Date  . GERD (gastroesophageal reflux disease)   . Hypertension   . Chronic constipation   . Hiatal hernia   . Endometriosis   . Syncopal episodes     since childhood  . Chronic sinusitis   . Diabetes mellitus   . Osteoarthritis   . Hyperlipidemia   . Allergic rhinitis, cause unspecified   . Renal cell cancer 11/2010 dx    R, s/p cryoablation 02/16/11  . Irritable bowel syndrome   . Hemorrhoids     Review of Systems  Constitutional: Positive for fatigue. Negative for fever.  Respiratory: Negative for cough, shortness of breath and wheezing.   Cardiovascular: Negative for chest pain and leg swelling.  Neurological: Positive for headaches. Negative for dizziness, seizures, syncope, facial asymmetry and numbness.  Psychiatric/Behavioral: Positive for dysphoric mood. Negative for confusion, self-injury and agitation. The patient is nervous/anxious.        Objective:   Physical Exam  BP 140/80  Pulse 100  Temp 97.1 F (36.2 C) (Oral)  Resp 18  Ht 5\' 2"  (1.575 m)  Wt 250 lb (113.399 kg)  BMI 45.73 kg/m2  SpO2 97% Wt Readings from Last 3 Encounters:  05/21/12 250 lb (113.399 kg)  04/25/12 246 lb 9.6 oz (111.857 kg)  04/09/12  248 lb (112.492 kg)   Constitutional: She is overweight but appears well-developed and well-nourished. No distress.  Neck: Normal range of motion. Neck supple. No JVD present. No thyromegaly present.  Cardiovascular: Normal rate, regular rhythm and normal heart sounds.  No murmur heard. chronic 2+ edema Pulmonary/Chest: Effort normal and breath sounds normal. No respiratory distress. She has no wheezes.   Neurological: She is alert and oriented to person, place, and time. No cranial nerve deficit. Coordination, speech and balance normal. Good strength in all extremities, no drift Psychiatric: She has dysphoric and anxious mood/affect. Her behavior is normal. Judgment and thought content normal.   Lab Results  Component Value Date   WBC 5.7 09/10/2011   HGB 13.0 09/10/2011   HCT 39 09/10/2011   PLT 135* 09/10/2011   CHOL 205* 03/15/2010   TRIG 95.0 03/15/2010   HDL 53.40 03/15/2010   LDLDIRECT 140.5 03/15/2010   ALT 13 09/10/2011   AST 20 09/10/2011   NA 141 09/10/2011   K 3.4 09/10/2011   CL 105 03/26/2011   CREATININE 0.9 09/10/2011   BUN 22* 09/10/2011   CO2 31 03/26/2011   TSH 2.55 09/10/2011   INR 0.91 02/08/2011   HGBA1C 5.5 09/10/2011        Assessment & Plan:   See problem list. Medications and labs reviewed today.

## 2012-05-21 NOTE — Assessment & Plan Note (Signed)
Diet controlled (take cinnamon herbal tx) Check a1c now Lab Results  Component Value Date   HGBA1C 5.5 09/10/2011

## 2012-05-21 NOTE — Assessment & Plan Note (Signed)
BP Readings from Last 3 Encounters:  05/21/12 140/80  04/25/12 130/74  04/09/12 140/82   The current medical regimen is effective, but cost prohibitive Add inderal and ACEI -continue lasix 20 qd- stop ARB/hct

## 2012-05-21 NOTE — Assessment & Plan Note (Signed)
Chronic symptoms, exacerbated by stress Not improved with tramadol Pt requests neuro eval - will refer Neuro exam benign today, symptoms stable Add Imitrex prn and Inderal LA prophlaxis with ongoing pain meds

## 2012-05-21 NOTE — Patient Instructions (Signed)
It was good to see you today. We have reviewed your prior records including labs and tests today Test(s) ordered today. Your results will be released to MyChart (or called to you) after review, usually within 72hours after test completion. If any changes need to be made, you will be notified at that same time. we'll make referral to neurology and for mammogram screening . Our office will contact you regarding appointment(s) once made. Stop Avalide as prescribed last visit by Dr Jonny Ruiz for blood pressure Start propanolol 2x/day (for BP and migraines), enalapril 1x/day for BP and continue lasix daily Use Imitrex as needed for migraine headache symptoms  Use amitriptyline at night as needed for sleep and migraine symptoms  Please schedule followup in 3-4 months, call sooner if problems.

## 2012-05-26 ENCOUNTER — Ambulatory Visit
Admission: RE | Admit: 2012-05-26 | Discharge: 2012-05-26 | Disposition: A | Discharge status: Home / Self Care | Payer: BC Managed Care – PPO | Source: Ambulatory Visit | Attending: Otolaryngology | Admitting: Otolaryngology

## 2012-05-26 ENCOUNTER — Other Ambulatory Visit: Payer: Self-pay | Admitting: Emergency Medicine

## 2012-05-26 ENCOUNTER — Other Ambulatory Visit: Payer: Self-pay | Admitting: Otolaryngology

## 2012-05-26 DIAGNOSIS — J329 Chronic sinusitis, unspecified: Secondary | ICD-10-CM

## 2012-05-26 DIAGNOSIS — D3000 Benign neoplasm of unspecified kidney: Secondary | ICD-10-CM

## 2012-05-26 LAB — BUN: BUN: 22 mg/dL (ref 6–23)

## 2012-05-26 LAB — CREATININE WITH EST GFR
Creat: 0.84 mg/dL (ref 0.50–1.10)
GFR, Est African American: >89 mL/min
GFR, Est Non African American: 78 mL/min

## 2012-06-05 ENCOUNTER — Ambulatory Visit (HOSPITAL_COMMUNITY)
Admission: RE | Admit: 2012-06-05 | Discharge: 2012-06-05 | Disposition: A | Discharge status: Home / Self Care | Payer: BC Managed Care – PPO | Source: Ambulatory Visit | Attending: Interventional Radiology | Admitting: Interventional Radiology

## 2012-06-05 ENCOUNTER — Ambulatory Visit
Admission: RE | Admit: 2012-06-05 | Discharge: 2012-06-05 | Disposition: A | Discharge status: Home / Self Care | Payer: BC Managed Care – PPO | Source: Ambulatory Visit | Attending: Interventional Radiology | Admitting: Interventional Radiology

## 2012-06-05 DIAGNOSIS — D4959 Neoplasm of unspecified behavior of other genitourinary organ: Secondary | ICD-10-CM | POA: Insufficient documentation

## 2012-06-05 DIAGNOSIS — N2889 Other specified disorders of kidney and ureter: Secondary | ICD-10-CM

## 2012-06-05 DIAGNOSIS — Z09 Encounter for follow-up examination after completed treatment for conditions other than malignant neoplasm: Secondary | ICD-10-CM | POA: Insufficient documentation

## 2012-06-05 MED ORDER — IOHEXOL 300 MG/ML  SOLN
125.0000 mL | Freq: Once | INTRAMUSCULAR | Status: AC | PRN
  Administered 2012-06-05: 125 mL via INTRAVENOUS

## 2012-06-10 ENCOUNTER — Telehealth: Payer: Self-pay | Admitting: Gastroenterology

## 2012-06-11 ENCOUNTER — Other Ambulatory Visit (HOSPITAL_COMMUNITY): Payer: Self-pay | Admitting: Internal Medicine

## 2012-06-11 ENCOUNTER — Encounter: Payer: Self-pay | Admitting: *Deleted

## 2012-06-11 DIAGNOSIS — Z1231 Encounter for screening mammogram for malignant neoplasm of breast: Secondary | ICD-10-CM

## 2012-06-11 NOTE — Progress Notes (Signed)
Patient ID: Janet Le, female   DOB: Nov 05, 1955, 56 y.o.   MRN: [id number]  ESTABLISHED PATIENT OFFICE VISIT  Chief Complaint: Status post cryoablation of right renal neoplasm on 02/16/2011.  History: Since her prior follow up visit, residual numbness in the right flank region has essentially completely resolved. The patient has no complaint of pain. She denies any hematuria, dysuria or fever. The patient's brother has recently been diagnosed with leukemia and is undergoing chemotherapy. There was delay in obtaining recent follow-up imaging due to time spent in court over custody of the patient's grandson.  Review of Systems: See above.  Exam: Vital signs: Blood pressure 113/66, pulse 88, respirations 14, temperature 97.5, oxygen saturation 100% on room air. General: No acute distress. Abdomen: Soft and nontender. No flank tenderness.  Labs: BUN 22, creatinine 0.84 and estimated GFR greater than 89 ml/minute.  Imaging: Follow-up CT was performed today showing stable ablation changes at the level of the treated lower pole right renal tumor with further contraction of scar tissue. There is no evidence of residual or recurrent enhancing tumor.  Assessment and Plan: The patient is doing well and is just over 1 year status post cryoablation of a right renal neoplasm. I have recommended follow-up in 12 months.

## 2012-06-11 NOTE — Telephone Encounter (Signed)
Pt with a hx of reflux last seen on 04/25/12 by Dr Jarold Motto. She has been on Dexilant for months and stated she has had a hacking cough and then spits up mucus; states it's very embarrassing. She saw her sinus MD who did a CT and the md stated her problem is coming from her reflux. Informed pt I can change her to Nexium and she can try OTC Mucinex, but pt requests to be seen d/t the time of the problem. Pt will see Doug Sou, PA tomorrow.

## 2012-06-11 NOTE — Telephone Encounter (Signed)
Tried to call pt and her Mail Box is full and I can't leave a message.

## 2012-06-12 ENCOUNTER — Encounter: Payer: Self-pay | Admitting: Gastroenterology

## 2012-06-12 ENCOUNTER — Telehealth: Payer: Self-pay | Admitting: Internal Medicine

## 2012-06-12 ENCOUNTER — Ambulatory Visit (INDEPENDENT_AMBULATORY_CARE_PROVIDER_SITE_OTHER): Payer: BC Managed Care – PPO | Admitting: Gastroenterology

## 2012-06-12 VITALS — BP 134/90 | HR 80 | Ht 62.0 in | Wt 248.0 lb

## 2012-06-12 DIAGNOSIS — K5909 Other constipation: Secondary | ICD-10-CM

## 2012-06-12 DIAGNOSIS — K219 Gastro-esophageal reflux disease without esophagitis: Secondary | ICD-10-CM

## 2012-06-12 DIAGNOSIS — R05 Cough: Secondary | ICD-10-CM | POA: Insufficient documentation

## 2012-06-12 DIAGNOSIS — R053 Chronic cough: Secondary | ICD-10-CM | POA: Insufficient documentation

## 2012-06-12 DIAGNOSIS — R059 Cough, unspecified: Secondary | ICD-10-CM

## 2012-06-12 MED ORDER — DEXLANSOPRAZOLE 60 MG PO CPDR: 60.0000 mg | DELAYED_RELEASE_CAPSULE | Freq: Every day | ORAL | Status: DC

## 2012-06-12 MED ORDER — LINACLOTIDE 290 MCG PO CAPS: 1.0000 | ORAL_CAPSULE | Freq: Every day | ORAL | Status: DC

## 2012-06-12 NOTE — Telephone Encounter (Signed)
i would take all for now - mine and then new med And come in for BP check and med review in next 2 weeks - nurse visit for BP check ok if unable to make OV for med review

## 2012-06-12 NOTE — Progress Notes (Signed)
06/12/2012 Janet Le [id number] 09/22/1955   History of Present Illness: Patient is a 56 year old female who presents to our office today with complaints of cough, bringing up mucus, wheezing at night, and hoarseness/irritation in her throat. She recently saw ENT, and they told her this is not secondary to her sinuses. It appears that she's had similar symptoms in the past and these have been evaluated from a GI standpoint. In fact, she had a 24 hour pH study and esophageal manometry in April 2011, which were both within normal limits at that time. EGD has not been performed since 2004. She does take Dexilant daily, which helps with her classic heartburn/reflux symptoms. She does have difficulty affording this medication. She reports a history of "bronchial issues" several years ago while living in [place]. She has not seen a pulmonary physician and this area.  She also battles chronic constipation, which is likely secondary to some of her medications. We have been providing her with Linzess 290 mcg daily, which helps with this issue. We do also provide her with samples of this on occasion to to medication cost as well. Last colonoscopy was April 2012 we showed only internal and external hemorrhoids at that time.  Current Medications, Allergies, Past Medical History, Past Surgical History, Family History and Social History were reviewed in [employer] record.   Physical Exam: BP 134/90  Pulse 80  Ht 5\' 2"  (1.575 m)  Wt 248 lb (112.492 kg)  BMI 45.36 kg/m2 General: Well developed , black female in no acute distress Head: Normocephalic and atraumatic Eyes:  sclerae anicteric, conjunctiva pink  Ears: Normal auditory acuity Lungs: Clear throughout to auscultation Heart: Regular rate and rhythm Abdomen: Soft, non-tender and non distended. No masses, no hepatomegaly. Normal bowel sounds Musculoskeletal: Symmetrical with no gross deformities  Neurological: Alert  oriented x 4, grossly nonfocal Psychological:  Alert and cooperative. Normal mood and affect  Assessment and Recommendations: -Cough with mucus, hoarseness, and wheezing at night. ENT says it is not secondary to her sinuses. She has had evaluation for similar symptoms in the past including 24-hour pH probe and esophageal manometry in 2011 which were normal at that time. We will repeat EGD, however, since she has not undergone that study since 2004. Pending results of the EGD, she may need repeat pH study with new high-resolution system. Question pulmonary evaluation since she does have a history of "bronchial issues" in the past. She will continue her Dexilant 60 mg daily as she is currently taking it; this does help with her classic heartburn/reflux symptoms. She was given samples of this at her visit today.  I have asked her to try some OTC Mucinex in the interim as well. -Constipation, likely secondary to several of her medications: Stable on Linzess 290 mcg daily. Samples and a new prescription will be provided to her today.

## 2012-06-12 NOTE — Progress Notes (Signed)
I agree with assessment and plans 

## 2012-06-12 NOTE — Telephone Encounter (Signed)
Called pt no answer not able to leave msg due to vm being full...Janet Le

## 2012-06-12 NOTE — Patient Instructions (Addendum)
You have been scheduled for an endoscopy with propofol. Please follow written instructions given to you at your visit today. If you use inhalers (even only as needed) or a CPAP machine, please bring them with you on the day of your procedure.  Please purchase the following medications over the counter and take as directed: Mucinex   We have provided you with samples of Dexilant 60 mg to take daily as well as Linzess 290 mcg to take once daily.  We have sent the following medications to your pharmacy for you to pick up at your convenience: Linzess

## 2012-06-12 NOTE — Telephone Encounter (Signed)
Patient was put on new blood pressure medication, Hydochlorothiazide, by Dr. Terrace Arabia, and she wants to know if she needs to continue taking the medication that Dr. Felicity Coyer put her on aswell

## 2012-06-12 NOTE — Telephone Encounter (Signed)
Pt return call back gave md response. Transferred to schedulers to make appt...Raechel Chute

## 2012-06-13 ENCOUNTER — Ambulatory Visit (HOSPITAL_COMMUNITY): Payer: BC Managed Care – PPO

## 2012-06-23 ENCOUNTER — Ambulatory Visit (AMBULATORY_SURGERY_CENTER): Payer: BC Managed Care – PPO | Admitting: Gastroenterology

## 2012-06-23 ENCOUNTER — Encounter: Payer: Self-pay | Admitting: Gastroenterology

## 2012-06-23 VITALS — BP 136/61 | HR 68 | Temp 98.0°F | Resp 17 | Ht 62.0 in | Wt 248.0 lb

## 2012-06-23 DIAGNOSIS — R05 Cough: Secondary | ICD-10-CM

## 2012-06-23 DIAGNOSIS — R0789 Other chest pain: Secondary | ICD-10-CM

## 2012-06-23 DIAGNOSIS — R059 Cough, unspecified: Secondary | ICD-10-CM

## 2012-06-23 DIAGNOSIS — D130 Benign neoplasm of esophagus: Secondary | ICD-10-CM

## 2012-06-23 MED ORDER — SODIUM CHLORIDE 0.9 % IV SOLN: 500.0000 mL | INTRAVENOUS | Status: DC

## 2012-06-23 NOTE — Op Note (Signed)
 [facility] [address] [place], [address]   ENDOSCOPY PROCEDURE REPORT  PATIENT: Janet Le, Janet Le  MR#: [id number] BIRTHDATE: 1956-01-05 , 56  yrs. old GENDER: Female ENDOSCOPIST:David Hale Bogus, MD, Clementeen Graham REFERRED BY: Rene Paci, M.D. PROCEDURE DATE:  06/23/2012 PROCEDURE:   EGD w/ biopsy ASA CLASS:    Class II INDICATIONS: Chest pain.  Chronic gough...no response to PPI RX... MEDICATION: Propofol (Diprivan) 160 mg IV and Glycopyrrolate (Robinul) 0.2 mg IV TOPICAL ANESTHETIC:   Cetacaine Spray  DESCRIPTION OF PROCEDURE:   After the risks and benefits of the procedure were explained, informed consent was obtained.  [facility] [id number]  endoscope was introduced through the mouth  and advanced to the second portion of the duodenum .  The instrument was slowly withdrawn as the mucosa was fully examined.    The upper, middle and distal third of the esophagus were carefully inspected and no abnormalities were noted.  The z-line was well seen at the GEJ.  The endoscope was pushed into the fundus which was normal including a retroflexed view.  The antrum, gastric body, first and second part of the duodenum were unremarkable.  Multiple biopsies were taken in the distal and mid esophagus to rule out eosinophilic esophagitis.    Retroflexed views revealed a 3 cm. hiatal hernia.    The scope was then withdrawn from the patient and the procedure completed.  COMPLICATIONS: There were no complications.   ENDOSCOPIC IMPRESSION: Normal EGD; multiple biopsies were taken in the distal and mid esophagus to rule out eosinophilic esophagitis .Her chronic cough I doubt is related to GERD>She also has multiple finctional complaints and recent normal CT scan exam.  RECOMMENDATIONS: 1.  Await pathology results 2.  Continue current medications 3. Pulmonary medicine referral   _______________________________ eSigned:  Mardella Layman, MD, [facility] 06/23/2012  11:09 AM   standard discharge

## 2012-06-23 NOTE — Progress Notes (Signed)
Patient did not experience any of the following events: a burn prior to discharge; a fall within the facility; wrong site/side/patient/procedure/implant event; or a hospital transfer or hospital admission upon discharge from the facility. (G8907) Patient did not have preoperative order for IV antibiotic SSI prophylaxis. (G8918)  

## 2012-06-23 NOTE — Progress Notes (Signed)
Called to room to assist during endoscopic procedure.  Patient ID and intended procedure confirmed with present staff. Received instructions for my participation in the procedure from the performing physician.  

## 2012-06-23 NOTE — Patient Instructions (Addendum)
Discharge instructions given with verbal understanding. Biopsies taken. Resume previous medications. YOU HAD AN ENDOSCOPIC PROCEDURE TODAY AT THE  ENDOSCOPY CENTER: Refer to the procedure report that was given to you for any specific questions about what was found during the examination.  If the procedure report does not answer your questions, please call your gastroenterologist to clarify.  If you requested that your care partner not be given the details of your procedure findings, then the procedure report has been included in a sealed envelope for you to review at your convenience later.  YOU SHOULD EXPECT: Some feelings of bloating in the abdomen. Passage of more gas than usual.  Walking can help get rid of the air that was put into your GI tract during the procedure and reduce the bloating. If you had a lower endoscopy (such as a colonoscopy or flexible sigmoidoscopy) you may notice spotting of blood in your stool or on the toilet paper. If you underwent a bowel prep for your procedure, then you may not have a normal bowel movement for a few days.  DIET: Your first meal following the procedure should be a light meal and then it is ok to progress to your normal diet.  A half-sandwich or bowl of soup is an example of a good first meal.  Heavy or fried foods are harder to digest and may make you feel nauseous or bloated.  Likewise meals heavy in dairy and vegetables can cause extra gas to form and this can also increase the bloating.  Drink plenty of fluids but you should avoid alcoholic beverages for 24 hours.  ACTIVITY: Your care partner should take you home directly after the procedure.  You should plan to take it easy, moving slowly for the rest of the day.  You can resume normal activity the day after the procedure however you should NOT DRIVE or use heavy machinery for 24 hours (because of the sedation medicines used during the test).    SYMPTOMS TO REPORT IMMEDIATELY: A gastroenterologist  can be reached at any hour.  During normal business hours, 8:30 AM to 5:00 PM Monday through Friday, call [phone].  After hours and on weekends, please call the GI answering service at [phone] who will take a message and have the physician on call contact you.   Following upper endoscopy (EGD)  Vomiting of blood or coffee ground material  New chest pain or pain under the shoulder blades  Painful or persistently difficult swallowing  New shortness of breath  Fever of 100F or higher  Black, tarry-looking stools  FOLLOW UP: If any biopsies were taken you will be contacted by phone or by letter within the next 1-3 weeks.  Call your gastroenterologist if you have not heard about the biopsies in 3 weeks.  Our staff will call the home number listed on your records the next business day following your procedure to check on you and address any questions or concerns that you may have at that time regarding the information given to you following your procedure. This is a courtesy call and so if there is no answer at the home number and we have not heard from you through the emergency physician on call, we will assume that you have returned to your regular daily activities without incident.  SIGNATURES/CONFIDENTIALITY: You and/or your care partner have signed paperwork which will be entered into your electronic medical record.  These signatures attest to the fact that that the information above on your After   Visit Summary has been reviewed and is understood.  Full responsibility of the confidentiality of this discharge information lies with you and/or your care-partner. 

## 2012-06-24 ENCOUNTER — Telehealth: Payer: Self-pay

## 2012-06-24 NOTE — Telephone Encounter (Signed)
The mail box [phone] is full and can not leave a message.  Maw

## 2012-06-24 NOTE — Telephone Encounter (Signed)
The pt called back to say she did fine, no problems from the procedure 06-23-12.  Pt asked where the biopsies were taken from.  I explained multiple biospies were taken from her esophagus.  She should receive a letter with the results within 2 weeks.  Pt also requested a work note for yesterday and mail it to [address] [place], [place], [place] [address].  Excuse completed and Darel Hong will mail out to the pt. Maw

## 2012-06-26 ENCOUNTER — Ambulatory Visit: Payer: BC Managed Care – PPO | Admitting: Internal Medicine

## 2012-07-01 ENCOUNTER — Encounter: Payer: Self-pay | Admitting: Internal Medicine

## 2012-07-01 ENCOUNTER — Ambulatory Visit (INDEPENDENT_AMBULATORY_CARE_PROVIDER_SITE_OTHER): Payer: BC Managed Care – PPO | Admitting: Internal Medicine

## 2012-07-01 ENCOUNTER — Telehealth: Payer: Self-pay | Admitting: *Deleted

## 2012-07-01 ENCOUNTER — Encounter: Payer: Self-pay | Admitting: Gastroenterology

## 2012-07-01 ENCOUNTER — Ambulatory Visit (INDEPENDENT_AMBULATORY_CARE_PROVIDER_SITE_OTHER)
Admission: RE | Admit: 2012-07-01 | Discharge: 2012-07-01 | Disposition: A | Discharge status: Home / Self Care | Payer: BC Managed Care – PPO | Source: Ambulatory Visit | Attending: Internal Medicine | Admitting: Internal Medicine

## 2012-07-01 ENCOUNTER — Other Ambulatory Visit (INDEPENDENT_AMBULATORY_CARE_PROVIDER_SITE_OTHER): Payer: BC Managed Care – PPO

## 2012-07-01 VITALS — BP 134/82 | HR 76 | Temp 97.7°F | Wt 257.4 lb

## 2012-07-01 DIAGNOSIS — E7800 Pure hypercholesterolemia, unspecified: Secondary | ICD-10-CM

## 2012-07-01 DIAGNOSIS — R059 Cough, unspecified: Secondary | ICD-10-CM

## 2012-07-01 DIAGNOSIS — Z1239 Encounter for other screening for malignant neoplasm of breast: Secondary | ICD-10-CM

## 2012-07-01 DIAGNOSIS — R5383 Other fatigue: Secondary | ICD-10-CM

## 2012-07-01 DIAGNOSIS — I10 Essential (primary) hypertension: Secondary | ICD-10-CM

## 2012-07-01 DIAGNOSIS — R05 Cough: Secondary | ICD-10-CM

## 2012-07-01 DIAGNOSIS — R053 Chronic cough: Secondary | ICD-10-CM

## 2012-07-01 DIAGNOSIS — Z13820 Encounter for screening for osteoporosis: Secondary | ICD-10-CM

## 2012-07-01 DIAGNOSIS — Z780 Asymptomatic menopausal state: Secondary | ICD-10-CM

## 2012-07-01 DIAGNOSIS — R5381 Other malaise: Secondary | ICD-10-CM

## 2012-07-01 LAB — CBC WITH DIFFERENTIAL/PLATELET
Basophils Absolute: 0 10*3/uL (ref 0.0–0.1)
Basophils Relative: 0.3 % (ref 0.0–3.0)
Eosinophils Absolute: 0.1 10*3/uL (ref 0.0–0.7)
Eosinophils Relative: 1.6 % (ref 0.0–5.0)
HCT: 37.7 % (ref 36.0–46.0)
Hemoglobin: 12.7 g/dL (ref 12.0–15.0)
Lymphocytes Relative: 29.5 % (ref 12.0–46.0)
Lymphs Abs: 1.7 10*3/uL (ref 0.7–4.0)
MCHC: 33.6 g/dL (ref 30.0–36.0)
MCV: 91.9 fl (ref 78.0–100.0)
Monocytes Absolute: 0.5 10*3/uL (ref 0.1–1.0)
Monocytes Relative: 7.9 % (ref 3.0–12.0)
Neutro Abs: 3.6 10*3/uL (ref 1.4–7.7)
Neutrophils Relative %: 60.7 % (ref 43.0–77.0)
Platelets: 128 10*3/uL — ABNORMAL LOW (ref 150.0–400.0)
RBC: 4.1 Mil/uL (ref 3.87–5.11)
RDW: 15.5 % — ABNORMAL HIGH (ref 11.5–14.6)
WBC: 5.9 10*3/uL (ref 4.5–10.5)

## 2012-07-01 LAB — HEPATIC FUNCTION PANEL
ALT: 20 U/L (ref 0–35)
AST: 31 U/L (ref 0–37)
Albumin: 3.5 g/dL (ref 3.5–5.2)
Alkaline Phosphatase: 95 U/L (ref 39–117)
Bilirubin, Direct: 0.1 mg/dL (ref 0.0–0.3)
Total Bilirubin: 0.8 mg/dL (ref 0.3–1.2)
Total Protein: 7.3 g/dL (ref 6.0–8.3)

## 2012-07-01 LAB — BASIC METABOLIC PANEL
BUN: 22 mg/dL (ref 6–23)
CO2: 29 mEq/L (ref 19–32)
Calcium: 9.5 mg/dL (ref 8.4–10.5)
Chloride: 105 mEq/L (ref 96–112)
Creatinine, Ser: 1 mg/dL (ref 0.4–1.2)
GFR: 75.24 mL/min (ref >60.00)
Glucose, Bld: 90 mg/dL (ref 70–99)
Potassium: 4.2 mEq/L (ref 3.5–5.1)
Sodium: 138 mEq/L (ref 135–145)

## 2012-07-01 LAB — LIPID PANEL
Cholesterol: 220 mg/dL — ABNORMAL HIGH (ref 0–200)
HDL: 54.2 mg/dL (ref >39.00)
Total CHOL/HDL Ratio: 4
Triglycerides: 69 mg/dL (ref 0.0–149.0)
VLDL: 13.8 mg/dL (ref 0.0–40.0)

## 2012-07-01 LAB — LDL CHOLESTEROL, DIRECT: Direct LDL: 143.8 mg/dL

## 2012-07-01 LAB — TSH: TSH: 3.01 u[IU]/mL (ref 0.35–5.50)

## 2012-07-01 LAB — VITAMIN B12: Vitamin B-12: 519 pg/mL (ref 211–911)

## 2012-07-01 NOTE — Progress Notes (Signed)
Subjective:    Patient ID: Janet Le, female    DOB: [birth year], 57 y.o.   MRN: [id number]  HPI here for follow up - reviewed chronic medical issues:  Chronic cough - s/p GI eval 05/2012 without abnormal findings - persisting "wheeze", especially lying supine  R RCC dx 11/2010 - working with uro/IR on same - s/p IR cryoablation 02/16/11  Anxiety, chronic symptoms - exacerbated by family stressors re: guardianship of g-son symptoms associated with panic attacks and chest pain  - Stopped sertraline 11/2010 due to ?side effects  (slurring speech) -  Intermittently working with psyc dr. Evelene Croon on same -on cymbalta but unsure if its working  HTN - variable compliance use of cost concerns - no increase in edema or change in headache   diet controlled DM - watching carbs - take cinnamon but no med rx - does not check home cbg regularly   Past Medical History  Diagnosis Date  . GERD (gastroesophageal reflux disease)   . Hypertension   . Chronic constipation   . Hiatal hernia   . Endometriosis   . Syncopal episodes     since childhood  . Chronic sinusitis   . Osteoarthritis   . Hyperlipidemia   . Allergic rhinitis, cause unspecified   . Renal cell cancer 11/2010 dx    R, s/p cryoablation 02/16/11  . Irritable bowel syndrome   . Hemorrhoids   . Esophageal stricture   . Hiatal hernia   . Diabetes mellitus     pt denies  pt denies dm    Review of Systems  Constitutional: Positive for fatigue. Negative for fever.  Respiratory: Positive for cough. Negative for shortness of breath and wheezing.   Cardiovascular: Negative for chest pain and leg swelling.  Neurological: Positive for headaches. Negative for dizziness, seizures, syncope, facial asymmetry and numbness.  Psychiatric/Behavioral: Positive for dysphoric mood. Negative for confusion, self-injury and agitation. The patient is nervous/anxious.        Objective:   Physical Exam  BP 134/82  Pulse 76  Temp 97.7 F (36.5  C) (Oral)  Wt 257 lb 6.4 oz (116.756 kg)  SpO2 96% Wt Readings from Last 3 Encounters:  07/01/12 257 lb 6.4 oz (116.756 kg)  06/23/12 248 lb (112.492 kg)  06/12/12 248 lb (112.492 kg)   Constitutional: She is overweight but appears well-developed and well-nourished. No distress.  Neck: Normal range of motion. Neck supple. No JVD present. No thyromegaly present.  Cardiovascular: Normal rate, regular rhythm and normal heart sounds.  No murmur heard. chronic 2+ edema Pulmonary/Chest: Effort normal and breath sounds normal. No respiratory distress. She has no wheezes.   Neurological: She is alert and oriented to person, place, and time. No cranial nerve deficit. Coordination, speech and balance normal. Good strength in all extremities, no drift Psychiatric: She has dysphoric and anxious mood/affect. Her behavior is normal. Judgment and thought content normal.   Lab Results  Component Value Date   WBC 5.7 09/10/2011   HGB 13.0 09/10/2011   HCT 39 09/10/2011   PLT 135* 09/10/2011   CHOL 205* 03/15/2010   TRIG 95.0 03/15/2010   HDL 53.40 03/15/2010   LDLDIRECT 140.5 03/15/2010   ALT 13 09/10/2011   AST 20 09/10/2011   NA 141 09/10/2011   K 3.4 09/10/2011   CL 105 03/26/2011   CREATININE 0.84 05/26/2012   BUN 22 05/26/2012   CO2 31 03/26/2011   TSH 2.55 09/10/2011   INR 0.91 02/08/2011   HGBA1C 5.8  05/21/2012        Assessment & Plan:   Fatigue - nonspecific symptoms/exam - check screening labs  Also see problem list. Medications and labs reviewed today.

## 2012-07-01 NOTE — Assessment & Plan Note (Signed)
BP Readings from Last 3 Encounters:  07/01/12 134/82  06/23/12 136/61  06/12/12 134/90   The current medical regimen is effective Stop ACEI due to cough - consider generic ARB Added inderal 04/2012 -continue hct-

## 2012-07-01 NOTE — Telephone Encounter (Signed)
Received msg pt states she fail to let md know that she needed a bone density & mammogram. [facility]...Janet Le

## 2012-07-01 NOTE — Patient Instructions (Signed)
It was good to see you today. We have reviewed your prior records including labs and tests today Test(s) ordered today - labs and chest xray. Your results will be released to MyChart (or called to you) after review, usually within 72hours after test completion. If any changes need to be made, you will be notified at that same time. Stop enalapril and continue Avalide + propanolol for blood pressure  Continue to use amitriptyline at night as needed for sleep and migraine symptoms  Please schedule followup in 4-6 months, call sooner if problems.

## 2012-07-01 NOTE — Assessment & Plan Note (Signed)
S/p GI evaluation 05/2012 for same - ?related to chronic sinusitis with GERD - Pt now concerned with bronchitis given hx second hand smoke exposure in past Pending appt with pulm for eval for same 07/12/11 Stop ACEI and will not resume same The patient is reassured that these symptoms do not appear to represent a serious or threatening condition.

## 2012-07-01 NOTE — Assessment & Plan Note (Signed)
Not on statin Weight trend reviewed The patient is asked to make an attempt to improve diet and exercise patterns to aid in medical management of this problem. Check annually

## 2012-07-02 NOTE — Telephone Encounter (Signed)
Pt notified md has place orders...lmb

## 2012-07-02 NOTE — Telephone Encounter (Signed)
Ok- ordered - thx

## 2012-07-11 ENCOUNTER — Ambulatory Visit (INDEPENDENT_AMBULATORY_CARE_PROVIDER_SITE_OTHER): Payer: BC Managed Care – PPO | Admitting: Internal Medicine

## 2012-07-11 ENCOUNTER — Encounter: Payer: Self-pay | Admitting: Internal Medicine

## 2012-07-11 VITALS — BP 112/88 | HR 78 | Temp 98.3°F | Ht 62.0 in | Wt 258.0 lb

## 2012-07-11 DIAGNOSIS — R05 Cough: Secondary | ICD-10-CM

## 2012-07-11 DIAGNOSIS — R059 Cough, unspecified: Secondary | ICD-10-CM

## 2012-07-11 MED ORDER — PREDNISONE (PAK) 10 MG PO TABS: ORAL_TABLET | ORAL | Status: DC

## 2012-07-11 MED ORDER — TRAMADOL HCL 50 MG PO TABS: ORAL_TABLET | ORAL | Status: DC

## 2012-07-11 NOTE — Progress Notes (Signed)
  Subjective:    Patient ID: Janet Le, female    DOB: Jul 01, 1955  MRN: [id number]  HPI  38 yobf never smoker but   sinus problems worse p moved to [place] in 1981 seasonal with changes in weather but not necessarily related to pollen > runny nose red eyes and cough referred to pulmonary clinic 07/11/2012 by Dr Felicity Coyer with refractory cough to point of vomit.  07/11/2012 1st pulmonary eval cc decades of recurrent sense of sinus drainage and cough with clear mucus with gagging but this is worst ever and lasted the longest > onset November 2013 > eval by Pollyann Kennedy but no notes in emr yet rec gerd rx- in meantime severe cough to point of gagging and choking worse daytime and exp to heavy fumes.  Noct sense of wheezing but no sob   - actually Sleeping ok without nocturnal  or early am exacerbation  of respiratory  c/o's or need for noct saba. Also denies any obvious fluctuation of symptoms with weather or environmental changes or other aggravating or alleviating factors except as outlined above     Review of Systems  Constitutional: Positive for unexpected weight change. Negative for fever.  HENT: Positive for congestion, rhinorrhea, sneezing, dental problem, postnasal drip, sinus pressure and tinnitus. Negative for ear pain, nosebleeds, sore throat and trouble swallowing.   Eyes: Positive for redness and itching.  Respiratory: Positive for cough and wheezing. Negative for chest tightness and shortness of breath.   Cardiovascular: Negative for palpitations and leg swelling.  Gastrointestinal: Negative for nausea and vomiting.  Genitourinary: Negative for dysuria.  Musculoskeletal: Negative for joint swelling.  Skin: Negative for rash.  Neurological: Negative for headaches.  Hematological: Does not bruise/bleed easily.  Psychiatric/Behavioral: Positive for dysphoric mood. The patient is nervous/anxious.        Objective:   Physical Exam   Pleasant slt hoarse amb bf nad with freq throat  clearing  Wt Readings from Last 3 Encounters:  07/11/12 258 lb (117.028 kg)  07/01/12 257 lb 6.4 oz (116.756 kg)  06/23/12 248 lb (112.492 kg)     HEENT: nl dentition, turbinates, and orophanx. Nl external ear canals without cough reflex   NECK :  without JVD/Nodes/TM/ nl carotid upstrokes bilaterally   LUNGS: no acc muscle use, clear to A and P bilaterally without cough on insp or exp maneuvers   CV:  RRR  no s3 or murmur or increase in P2, no edema   ABD:  soft and nontender with nl excursion in the supine position. No bruits or organomegaly, bowel sounds nl  MS:  warm without deformities, calf tenderness, cyanosis or clubbing  SKIN: warm and dry without lesions    NEURO:  alert, approp, no deficits    07/01/12 cxr No acute findings; stable mild cardiac enlargement.        Assessment & Plan:

## 2012-07-11 NOTE — Patient Instructions (Addendum)
The key to effective treatment for your cough is eliminating the non-stop cycle of cough you're stuck in long enough to let your airway heal completely and then see if there is anything still making you cough once you stop the cough suppression, but this should take no more than 5 days to figuure out  First take delsym two tsp every 12 hours and supplement if needed with  tramadol 50 mg (ultram) up to 2 every 4 hours to suppress the urge to cough. Swallowing water or using ice chips/non mint and menthol containing candies (such as lifesavers or sugarless jolly ranchers) are also effective.  You should rest your voice and avoid activities that you know make you cough.  Once you have eliminated the cough for 3 straight days try reducing the tramadol first,  then the delsym as tolerated.    Try Dexilant 60 mg   Take 30-60 min before first meal of the day and Pepcid 20 mg one bedtime until return  GERD (REFLUX)  is an extremely common cause of respiratory symptoms, many times with no significant heartburn at all.    It can be treated with medication, but also with lifestyle changes including avoidance of late meals, excessive alcohol, smoking cessation, and avoid fatty foods, chocolate, peppermint, colas, red wine, and acidic juices such as orange juice.  NO MINT OR MENTHOL PRODUCTS SO NO COUGH DROPS  USE SUGARLESS CANDY INSTEAD (jolley ranchers or Stover's)  NO OIL BASED VITAMINS - use powdered substitutes.   Prednisone 10 mg take  4 each am x 2 days,   2 each am x 2 days,  1 each am x2days and stop    See Tammy NP w/in 2 weeks with all your medications, even over the counter meds, separated in two separate bags, the ones you take no matter what vs the ones you stop once you feel better and take only as needed when you feel you need them.   Tammy  will generate for you a new user friendly medication calendar that will put [place] all on the same page re: your medication use.    Without this process, it  simply isn't possible to assure that we are providing  your outpatient care  with  the attention to detail we feel you deserve.   If we cannot assure that you're getting that kind of care,  then we cannot manage your problem effectively from this clinic.  Once you have seen Tammy and we are sure that we're all on the same page with your medication use she will arrange follow up with me.

## 2012-07-15 ENCOUNTER — Ambulatory Visit: Payer: BC Managed Care – PPO

## 2012-07-15 DIAGNOSIS — R05 Cough: Secondary | ICD-10-CM | POA: Insufficient documentation

## 2012-07-15 DIAGNOSIS — R059 Cough, unspecified: Secondary | ICD-10-CM | POA: Insufficient documentation

## 2012-07-15 NOTE — Assessment & Plan Note (Addendum)
The most common causes of chronic cough in immunocompetent adults include the following: upper airway cough syndrome (UACS), previously referred to as postnasal drip syndrome (PNDS), which is caused by variety of rhinosinus conditions; (2) asthma; (3) GERD; (4) chronic bronchitis from cigarette smoking or other inhaled environmental irritants; (5) nonasthmatic eosinophilic bronchitis; and (6) bronchiectasis.   These conditions, singly or in combination, have accounted for up to 94% of the causes of chronic cough in prospective studies.   Other conditions have constituted no >6% of the causes in prospective studies These have included bronchogenic carcinoma, chronic interstitial pneumonia, sarcoidosis, left ventricular failure, ACEI-induced cough, and aspiration from a condition associated with pharyngeal dysfunction.    Chronic cough is often simultaneously caused by more than one condition. A single cause has been found from 38 to 82% of the time, multiple causes from 18 to 62%. Multiply caused cough has been the result of three diseases up to 42% of the time.       Of the three most common causes of chronic cough, only one (GERD)  can actually cause the other two (asthma and post nasal drip syndrome)  and perpetuate the cylce of cough inducing airway trauma, inflammation, heightened sensitivity to reflux which is prompted by the cough itself via a cyclical mechanism.    This may partially respond to steroids and look like asthma and post nasal drainage but never erradicated completely unless the cough and the secondary reflux are eliminated, preferably both at the same time.  While not intuitively obvious, many patients with chronic low grade reflux do not cough until there is a secondary insult that disturbs the protective epithelial barrier and exposes sensitive nerve endings.  This can be viral or direct physical injury such as with an endotracheal tube.   The point is that once this occurs, it is  difficult to eliminate using anything but a maximally effective acid suppression regimen at least in the short run, accompanied by an appropriate diet to address non acid GERD.   See instructions for specific recommendations which were reviewed directly with the patient who was given a copy with highlighter outlining the key components.   Pt really struggling with concept of med reconciliation and organization.  To keep things simple, I have asked the patient to first separate medicines that are perceived as maintenance, that is to be taken daily "no matter what", from those medicines that are taken on only on an as-needed basis and I have given the patient examples of both, and then return to see our NP to generate a  detailed  medication calendar which should be followed until the next physician sees the patient and updates it.

## 2012-07-16 ENCOUNTER — Ambulatory Visit (HOSPITAL_COMMUNITY)
Admission: RE | Admit: 2012-07-16 | Discharge: 2012-07-16 | Disposition: A | Discharge status: Home / Self Care | Payer: BC Managed Care – PPO | Source: Ambulatory Visit | Attending: Internal Medicine | Admitting: Internal Medicine

## 2012-07-16 DIAGNOSIS — Z1239 Encounter for other screening for malignant neoplasm of breast: Secondary | ICD-10-CM

## 2012-07-16 DIAGNOSIS — Z1231 Encounter for screening mammogram for malignant neoplasm of breast: Secondary | ICD-10-CM

## 2012-07-16 DIAGNOSIS — Z13820 Encounter for screening for osteoporosis: Secondary | ICD-10-CM | POA: Insufficient documentation

## 2012-07-16 DIAGNOSIS — Z780 Asymptomatic menopausal state: Secondary | ICD-10-CM | POA: Insufficient documentation

## 2012-07-16 LAB — HM DEXA SCAN

## 2012-07-21 ENCOUNTER — Encounter: Payer: Self-pay | Admitting: Internal Medicine

## 2012-07-22 ENCOUNTER — Telehealth: Payer: Self-pay | Admitting: Internal Medicine

## 2012-07-22 ENCOUNTER — Encounter: Payer: Self-pay | Admitting: Internal Medicine

## 2012-07-22 DIAGNOSIS — M810 Age-related osteoporosis without current pathological fracture: Secondary | ICD-10-CM | POA: Insufficient documentation

## 2012-07-22 MED ORDER — VITAMIN D 1000 UNITS PO TABS: 1000.0000 [IU] | ORAL_TABLET | Freq: Every day | ORAL | Status: DC

## 2012-07-22 MED ORDER — ALENDRONATE SODIUM 70 MG PO TABS: 70.0000 mg | ORAL_TABLET | ORAL | Status: DC

## 2012-07-22 NOTE — Telephone Encounter (Signed)
Notified pt with md response. Pt had several ? Concerning bone density. Transferred to schedulers to make appt to discuss test.../lmb

## 2012-07-22 NOTE — Telephone Encounter (Signed)
Please call pt: DEXA shows osteoporosis, progressive since last scan in 01/2008. Score now -2.5, prev -1.3 To treat this condition (and thereby decrease risk of hip or vertebral fracture), she shoulde start Fosamax 70mg  once weekly - erx done - continue OsCal type supplement 2x/day and OTC Vit D 1000mg  qd

## 2012-07-23 ENCOUNTER — Encounter: Payer: BC Managed Care – PPO | Admitting: Adult Health

## 2012-07-24 ENCOUNTER — Ambulatory Visit: Payer: BC Managed Care – PPO | Admitting: Internal Medicine

## 2012-07-30 ENCOUNTER — Telehealth: Payer: Self-pay | Admitting: Internal Medicine

## 2012-07-30 ENCOUNTER — Ambulatory Visit (INDEPENDENT_AMBULATORY_CARE_PROVIDER_SITE_OTHER): Payer: BC Managed Care – PPO | Admitting: Internal Medicine

## 2012-07-30 ENCOUNTER — Encounter: Payer: Self-pay | Admitting: Adult Health

## 2012-07-30 ENCOUNTER — Ambulatory Visit (INDEPENDENT_AMBULATORY_CARE_PROVIDER_SITE_OTHER): Payer: BC Managed Care – PPO | Admitting: Adult Health

## 2012-07-30 ENCOUNTER — Ambulatory Visit: Payer: BC Managed Care – PPO | Admitting: Internal Medicine

## 2012-07-30 ENCOUNTER — Encounter: Payer: Self-pay | Admitting: Internal Medicine

## 2012-07-30 VITALS — BP 120/82 | HR 55 | Temp 97.8°F

## 2012-07-30 VITALS — BP 138/80 | HR 64 | Temp 97.2°F | Ht 62.0 in | Wt 254.2 lb

## 2012-07-30 DIAGNOSIS — M810 Age-related osteoporosis without current pathological fracture: Secondary | ICD-10-CM

## 2012-07-30 DIAGNOSIS — M542 Cervicalgia: Secondary | ICD-10-CM

## 2012-07-30 DIAGNOSIS — I10 Essential (primary) hypertension: Secondary | ICD-10-CM

## 2012-07-30 DIAGNOSIS — R05 Cough: Secondary | ICD-10-CM

## 2012-07-30 DIAGNOSIS — R059 Cough, unspecified: Secondary | ICD-10-CM

## 2012-07-30 DIAGNOSIS — K219 Gastro-esophageal reflux disease without esophagitis: Secondary | ICD-10-CM

## 2012-07-30 MED ORDER — MELOXICAM 15 MG PO TABS: 15.0000 mg | ORAL_TABLET | Freq: Every day | ORAL | Status: DC | PRN

## 2012-07-30 MED ORDER — TRAMADOL HCL 50 MG PO TABS: 50.0000 mg | ORAL_TABLET | Freq: Four times a day (QID) | ORAL | Status: DC | PRN

## 2012-07-30 NOTE — Patient Instructions (Addendum)
Follow med calendar closely and bring to each visit. Avoid ACE inhibitors in the future. Due to cough. Follow up with Dr. Sherene Sires in 6 weeks and as needed

## 2012-07-30 NOTE — Assessment & Plan Note (Signed)
Upper airway cough syndrome, secondary to ACE inhibitor, and possible underlying reflux. Patient's cough is much improved off her ACE inhibitor. Patient's continue on reflux, prevention measures. Patient has been advised to avoid ACE inhibitors in the future. Patient is on a nonselective beta blocker however, seems to be free from wheezing currently. If cough does not totally stop to consider changing propanolol to bisoprolol. If needed. Patient's medications were reviewed today and patient education was given. Computerized medication calendar was adjusted/completed  Patient is to followup in 6 weeks and as needed

## 2012-07-30 NOTE — Assessment & Plan Note (Signed)
DEXA January 2014 reviewed in depth today Patient agrees to begin Fosamax weekly with increased supplemental calcium and vitamin D and Educated to increase weightbearing exercise activity and plan to recheck in 25 months

## 2012-07-30 NOTE — Addendum Note (Signed)
Addended by: Boone Master E on: 07/30/2012 01:51 PM   Modules accepted: Orders

## 2012-07-30 NOTE — Telephone Encounter (Signed)
Called pt had ? Concerning mammogram. Discuss with pt  Results was normal.../lmb

## 2012-07-30 NOTE — Progress Notes (Signed)
Subjective:    Patient ID: Janet Le, female    DOB: Jul 27, 1955  MRN: [id number] HPI  47 yobf never smoker but   sinus problems worse p moved to [place] in 1981 seasonal with changes in weather but not necessarily related to pollen > runny nose red eyes and cough referred to pulmonary clinic 07/11/2012 by Dr Felicity Coyer with refractory cough to point of vomit.  07/11/2012 1st pulmonary eval cc decades of recurrent sense of sinus drainage and cough with clear mucus with gagging but this is worst ever and lasted the longest > onset November 2013 > eval by Pollyann Kennedy but no notes in emr yet rec gerd rx- in meantime severe cough to point of gagging and choking worse daytime and exp to heavy fumes. >continued off ACE, steroid taper., add pepcid At bedtime    07/30/2012 Follow up and Med review  Patient returns for a two-week followup and medication review. We reviewed all her medications and organized them into a medication calendar with patient education. It appears that patient is taking most of her medications correctly.  She does not have her propanolol today and does not  know what most of her medications are indicated for  We spent extra time going over each medications and its purpose.  Patient was seen 2 weeks ago for initial pulmonary evaluation for persistent cough. It was felt that patient had an upper airway cough syndrome, secondary to multiple factors, including recent ACE inhibitor exposure and possible underlying reflux.  Patient was given a steroid taper. Pepcid was added to her nighttime regimen, along with a reflux, prevention regimen. She was also placed on tramadol and Delsym for cough control. Patient reports that she is feeling much improved. Cough has decreased dramatically.  Her wheezing has resolved. She does continue to have some mild nasal drainage, hoarseness and congestion on and off. She denies any fever or discolored mucus, orthopnea, PND, or increased leg swelling.  Chest x-ray  was negative for any acute pulmonary process.   Review of Systems  Constitutional:   No  weight loss, night sweats,  Fevers, chills, + fatigue, or  lassitude.  HEENT:   No headaches,  Difficulty swallowing,  Tooth/dental problems, or  Sore throat,                No sneezing, itching, ear ache, nasal congestion, post nasal drip,   CV:  No chest pain,  Orthopnea, PND, swelling in lower extremities, anasarca, dizziness, palpitations, syncope.   GI  No heartburn, indigestion, abdominal pain, nausea, vomiting, diarrhea, change in bowel habits, loss of appetite, bloody stools.   Resp:    No coughing up of blood.     No chest wall deformity  Skin: no rash or lesions.  GU: no dysuria, change in color of urine, no urgency or frequency.  No flank pain, no hematuria   MS:  No joint pain or swelling.  No decreased range of motion.  No back pain.  Psych:  No change in mood or affect. No depression or anxiety.  No memory loss.          Objective:   Physical Exam   Pleasant , NAD    HEENT: nl dentition, turbinates, and orophanx. Nl external ear canals without cough reflex   NECK :  without JVD/Nodes/TM/ nl carotid upstrokes bilaterally   LUNGS: no acc muscle use, clear to A and P bilaterally without cough on insp or exp maneuvers   CV:  RRR  no  s3 or murmur or increase in P2, no edema   ABD:  soft and nontender with nl excursion in the supine position. No bruits or organomegaly, bowel sounds nl  MS:  warm without deformities, calf tenderness, cyanosis or clubbing  SKIN: warm and dry without lesions    NEURO:  alert, approp, no deficits    07/01/12 cxr No acute findings; stable mild cardiac enlargement.        Assessment & Plan:

## 2012-07-30 NOTE — Patient Instructions (Signed)
It was good to see you today. We have reviewed your prior records including labs, medications and tests today Refill on medication(s) as discussed today. Check on the Internet with brand name medications for information on patient assistance programs. You can bring these to our office for signature after you have completed your part we'll make referral to physical therapy for your neck pain. Our office will contact you regarding appointment(s) once made. Please schedule followup in 4-6 months, call sooner if problems.

## 2012-07-30 NOTE — Telephone Encounter (Signed)
Patient is requesting a call back to follow up from her appointment today

## 2012-07-30 NOTE — Assessment & Plan Note (Signed)
BP Readings from Last 3 Encounters:  07/30/12 120/82  07/30/12 138/80  07/11/12 112/88   The current medical regimen is effective Stopped ACEI due to cough 06/2012- on ARB and HCT combo Added inderal 04/2012 -

## 2012-07-30 NOTE — Assessment & Plan Note (Signed)
Discussed cost alternative PPI as to currently prescribed therapy, pt declines change in meds Patient will apply for drug assistance through manufacturer on pharmaceutical website and notify us if assistance is needed

## 2012-07-30 NOTE — Progress Notes (Signed)
Subjective:    Patient ID: Janet Le, female    DOB: 1956/01/18, 57 y.o.   MRN: [id number]  HPI here for follow up - reviewed chronic medical issues:  Osteoporosis by 06/2012 DEXA - reviewed in depth today - rx'd fosamax calcium and vitamin D supplementation. Has not yet begun same pending further discussion  Chronic cough - s/p GI eval 05/2012 without abnormal findings - persisting "wheeze", especially lying supine - pulm eval 07/2012 ongoing for same  R RCC dx 11/2010 - working with uro/IR on same - s/p IR cryoablation 02/16/11  Anxiety, chronic symptoms - exacerbated by family stressors re: guardianship of g-son symptoms associated with panic attacks and chest pain  - Stopped sertraline 11/2010 due to ?side effects (slurring speech) -  Intermittently working with psyc dr. Evelene Croon on same -on cymbalta but unsure if its working  hypertension - variable compliance use due to cost concerns - no increase in edema or change in headache   diet controlled DM - watching carbs - take cinnamon but no med rx - does not check home cbg regularly   Past Medical History  Diagnosis Date  . GERD (gastroesophageal reflux disease)   . Hypertension   . Chronic constipation   . Hiatal hernia   . Endometriosis   . Syncopal episodes     since childhood  . Chronic sinusitis   . Osteoarthritis   . Hyperlipidemia   . Allergic rhinitis, cause unspecified   . Renal cell cancer 11/2010 dx    R, s/p cryoablation 02/16/11  . Irritable bowel syndrome   . Hemorrhoids   . Esophageal stricture   . Hiatal hernia   . Diabetes mellitus     pt denies  pt denies dm    Review of Systems  Constitutional: Positive for fatigue. Negative for fever.  Respiratory: Positive for cough. Negative for shortness of breath and wheezing.   Cardiovascular: Negative for chest pain and leg swelling.  Neurological: Positive for headaches. Negative for dizziness, seizures, syncope, facial asymmetry and numbness.   Psychiatric/Behavioral: Positive for dysphoric mood. Negative for confusion, self-injury and agitation. The patient is nervous/anxious.        Objective:   Physical Exam  BP 120/82  Pulse 55  Temp 97.8 F (36.6 C) (Oral)  SpO2 96% Wt Readings from Last 3 Encounters:  07/30/12 254 lb 3.2 oz (115.304 kg)  07/11/12 258 lb (117.028 kg)  07/01/12 257 lb 6.4 oz (116.756 kg)   Constitutional: She is overweight but appears well-developed and well-nourished. No distress.  Neck: Normal range of motion. Neck supple. No JVD present. No thyromegaly present.  Cardiovascular: Normal rate, regular rhythm and normal heart sounds.  No murmur heard. chronic 2+ edema Pulmonary/Chest: Effort normal and breath sounds normal. No respiratory distress. She has no wheezes.   Neurological: She is alert and oriented to person, place, and time. No cranial nerve deficit. Coordination, speech and balance normal. Good strength in all extremities, no drift Psychiatric: She has dysphoric mood/affect. Her behavior is normal. Judgment and thought content normal.   Lab Results  Component Value Date   WBC 5.9 07/01/2012   HGB 12.7 07/01/2012   HCT 37.7 07/01/2012   PLT 128.0* 07/01/2012   CHOL 220* 07/01/2012   TRIG 69.0 07/01/2012   HDL 54.20 07/01/2012   LDLDIRECT 143.8 07/01/2012   ALT 20 07/01/2012   AST 31 07/01/2012   NA 138 07/01/2012   K 4.2 07/01/2012   CL 105 07/01/2012   CREATININE 1.0  07/01/2012   BUN 22 07/01/2012   CO2 29 07/01/2012   TSH 3.01 07/01/2012   INR 0.91 02/08/2011   HGBA1C 5.8 05/21/2012        Assessment & Plan:   Also see problem list. Medications and labs reviewed today.  Cervicalgia - MSkel and neuro exam and hx benign - advised to resume daily meloxicam for anti-inflammatory effect and take tramadol when needed for pain -also refer to physical therapy

## 2012-08-14 ENCOUNTER — Telehealth: Payer: Self-pay | Admitting: Internal Medicine

## 2012-08-14 NOTE — Telephone Encounter (Signed)
cxr 07/01/12? Normal dexa  - we discussed this 07/30/12 at OV

## 2012-08-14 NOTE — Telephone Encounter (Signed)
Patient is requesting clarification on her chest x-ray results

## 2012-08-15 NOTE — Telephone Encounter (Signed)
Called pt no answer LMOM cxr was normal. Pt was notified on 07/01/12 with results...lmb

## 2012-08-20 ENCOUNTER — Ambulatory Visit: Payer: BC Managed Care – PPO | Admitting: Internal Medicine

## 2012-08-21 ENCOUNTER — Other Ambulatory Visit: Payer: Self-pay | Admitting: Gastroenterology

## 2012-09-10 ENCOUNTER — Ambulatory Visit: Payer: BC Managed Care – PPO | Admitting: Internal Medicine

## 2012-10-31 ENCOUNTER — Other Ambulatory Visit: Payer: Self-pay | Admitting: Gastroenterology

## 2012-10-31 ENCOUNTER — Other Ambulatory Visit: Payer: Self-pay | Admitting: Internal Medicine

## 2012-12-04 ENCOUNTER — Other Ambulatory Visit: Payer: Self-pay | Admitting: Gastroenterology

## 2012-12-31 ENCOUNTER — Ambulatory Visit: Payer: BC Managed Care – PPO | Admitting: Internal Medicine

## 2013-01-07 ENCOUNTER — Other Ambulatory Visit: Payer: Self-pay | Admitting: Internal Medicine

## 2013-01-07 ENCOUNTER — Ambulatory Visit (INDEPENDENT_AMBULATORY_CARE_PROVIDER_SITE_OTHER): Payer: BC Managed Care – PPO | Admitting: Internal Medicine

## 2013-01-07 ENCOUNTER — Other Ambulatory Visit (INDEPENDENT_AMBULATORY_CARE_PROVIDER_SITE_OTHER): Payer: BC Managed Care – PPO

## 2013-01-07 ENCOUNTER — Encounter: Payer: Self-pay | Admitting: Internal Medicine

## 2013-01-07 VITALS — BP 122/82 | HR 54 | Temp 97.8°F | Wt 252.8 lb

## 2013-01-07 DIAGNOSIS — R7309 Other abnormal glucose: Secondary | ICD-10-CM

## 2013-01-07 DIAGNOSIS — R5381 Other malaise: Secondary | ICD-10-CM

## 2013-01-07 DIAGNOSIS — E669 Obesity, unspecified: Secondary | ICD-10-CM

## 2013-01-07 DIAGNOSIS — Z23 Encounter for immunization: Secondary | ICD-10-CM

## 2013-01-07 DIAGNOSIS — R5383 Other fatigue: Secondary | ICD-10-CM

## 2013-01-07 DIAGNOSIS — H9190 Unspecified hearing loss, unspecified ear: Secondary | ICD-10-CM

## 2013-01-07 DIAGNOSIS — E7800 Pure hypercholesterolemia, unspecified: Secondary | ICD-10-CM

## 2013-01-07 DIAGNOSIS — H9193 Unspecified hearing loss, bilateral: Secondary | ICD-10-CM

## 2013-01-07 LAB — MICROALBUMIN / CREATININE URINE RATIO
Creatinine,U: 121.2 mg/dL
Microalb Creat Ratio: 0.7 mg/g (ref 0.0–30.0)
Microalb, Ur: 0.8 mg/dL (ref 0.0–1.9)

## 2013-01-07 LAB — HEMOGLOBIN A1C: Hgb A1c MFr Bld: 6.1 % (ref 4.6–6.5)

## 2013-01-07 MED ORDER — LORCASERIN HCL 10 MG PO TABS: 10.0000 mg | ORAL_TABLET | Freq: Two times a day (BID) | ORAL | Status: DC

## 2013-01-07 MED ORDER — PRAVASTATIN SODIUM 20 MG PO TABS: 20.0000 mg | ORAL_TABLET | Freq: Every day | ORAL | Status: DC

## 2013-01-07 NOTE — Assessment & Plan Note (Signed)
Wt Readings from Last 3 Encounters:  01/07/13 252 lb 12.8 oz (114.669 kg)  07/30/12 254 lb 3.2 oz (115.304 kg)  07/11/12 258 lb (117.028 kg)   Body mass index is 46.23 kg/(m^2). Pt wishes to try Belviq to compliment changes in diet/exercise Met with nutrition 2013 - working on recommendations

## 2013-01-07 NOTE — Progress Notes (Signed)
Subjective:    Patient ID: Janet Le, female    DOB: [birth year], 57 y.o.   MRN: [id number]  HPI here for follow up - reviewed chronic medical issues:  R RCC dx 11/2010 - working with uro/IR on same - s/p IR cryoablation 02/16/11  Anxiety, chronic symptoms - exacerbated by family stressors re: guardianship of g-son, separation from spouse (ongoing since 2007); symptoms associated with panic attacks and chest pain  - Stopped sertraline 11/2010 due to ?side effects  (slurring speech) -  Intermittently working with psyc dr. Evelene Croon on same -ineffective relief with cymbalta, takes amitriptyline for sleep as needed  hypertension - variable compliance use of cost concerns - no increase in edema or change in chronic headache   diet controlled DM hx - watching carbs - take cinnamon prn but no med rx - does not check home cbg regularly  Osteoporosis - dx 07/2012 - now on weekly Fosamax and Ca+D - denies falls or bone pain, no painful swallow   Past Medical History  Diagnosis Date  . GERD (gastroesophageal reflux disease)   . Hypertension   . Chronic constipation   . Hiatal hernia   . Endometriosis   . Syncopal episodes     since childhood  . Chronic sinusitis   . Osteoarthritis   . Hyperlipidemia   . Allergic rhinitis, cause unspecified   . Renal cell cancer 11/2010 dx    R, s/p cryoablation 02/16/11  . Irritable bowel syndrome   . Hemorrhoids   . Esophageal stricture   . Hiatal hernia   . Diabetes mellitus     diet controlled    Review of Systems  Constitutional: Positive for fatigue (chronic). Negative for fever.  Respiratory: Negative for cough, shortness of breath and wheezing.   Cardiovascular: Negative for chest pain, palpitations and leg swelling.  Neurological: Negative for numbness and headaches.  Psychiatric/Behavioral: Positive for sleep disturbance and dysphoric mood. Negative for hallucinations, confusion, self-injury and agitation. The patient is not nervous/anxious.         Objective:   Physical Exam  BP 122/82  Pulse 54  Temp(Src) 97.8 F (36.6 C) (Oral)  Wt 252 lb 12.8 oz (114.669 kg)  BMI 46.23 kg/m2  SpO2 99% Wt Readings from Last 3 Encounters:  01/07/13 252 lb 12.8 oz (114.669 kg)  07/30/12 254 lb 3.2 oz (115.304 kg)  07/11/12 258 lb (117.028 kg)   Constitutional: Janet Le is overweight but appears well-developed and well-nourished. No distress.  Neck: Normal range of motion. Neck supple. No JVD present. No thyromegaly present.  Cardiovascular: Normal rate, regular rhythm and normal heart sounds.  No murmur heard. chronic 2+ edema Pulmonary/Chest: Effort normal and breath sounds normal. No respiratory distress. Janet Le has no wheezes.   Neurological: Janet Le is alert and oriented to person, place, and time. No cranial nerve deficit. Coordination, speech and balance normal. Good strength in all extremities, no drift Psychiatric: Janet Le has min dysphoric and anxious mood/affect. Her behavior is normal. Judgment and thought content normal.   Lab Results  Component Value Date   WBC 5.9 07/01/2012   HGB 12.7 07/01/2012   HCT 37.7 07/01/2012   PLT 128.0* 07/01/2012   CHOL 220* 07/01/2012   TRIG 69.0 07/01/2012   HDL 54.20 07/01/2012   LDLDIRECT 143.8 07/01/2012   ALT 20 07/01/2012   AST 31 07/01/2012   NA 138 07/01/2012   K 4.2 07/01/2012   CL 105 07/01/2012   CREATININE 1.0 07/01/2012   BUN 22 07/01/2012  CO2 29 07/01/2012   TSH 3.01 07/01/2012   INR 0.91 02/08/2011   HGBA1C 5.8 05/21/2012       Assessment & Plan:   Fatigue - nonspecific symptoms/exam - check screening labs  Change in hearing- refer to audiology  Also see problem list. Medications and labs reviewed today.

## 2013-01-07 NOTE — Patient Instructions (Signed)
It was good to see you today. We have reviewed your prior records including labs, medications and tests today Test(s) ordered today. Your results will be released to MyChart (or called to you) after review, usually within 72hours after test completion. If any changes need to be made, you will be notified at that same time. Begin Belviq for weight loss and pravastatin for cholesterol - Your prescription(s) have been submitted to your pharmacy. Please take as directed and contact our office if you believe you are having problem(s) with the medication(s). Also refill on medication(s) as discussed today. we'll make referral to audiology for hearing test. Our office will contact you regarding appointment(s) once made. Please schedule followup in 3 months for weight recheck, call sooner if problems. Belviq will not be continued if you have not lost at leadt 5% of weight in next 12 weeks Tdap immunization updated today

## 2013-01-07 NOTE — Addendum Note (Signed)
Addended by: Deatra James on: 01/07/2013 10:00 AM   Modules accepted: Orders

## 2013-01-07 NOTE — Assessment & Plan Note (Signed)
Not on statin - agreeable to try low dose prava as LDL>100 last check Weight trend reviewed The patient is asked to make an attempt to improve diet and exercise patterns to aid in medical management of this problem. Check annually

## 2013-01-07 NOTE — Assessment & Plan Note (Signed)
Diet controlled (take cinnamon herbal tx) Check a1c now Lab Results  Component Value Date   HGBA1C 5.8 05/21/2012

## 2013-01-08 ENCOUNTER — Ambulatory Visit: Payer: Self-pay | Admitting: Nurse Practitioner

## 2013-02-06 ENCOUNTER — Other Ambulatory Visit: Payer: Self-pay | Admitting: Internal Medicine

## 2013-02-09 ENCOUNTER — Other Ambulatory Visit: Payer: Self-pay | Admitting: *Deleted

## 2013-02-09 MED ORDER — PROPRANOLOL HCL 80 MG PO TABS: ORAL_TABLET | ORAL | Status: DC

## 2013-03-07 ENCOUNTER — Other Ambulatory Visit: Payer: Self-pay | Admitting: Internal Medicine

## 2013-03-09 NOTE — Telephone Encounter (Signed)
Refill done.  

## 2013-03-11 ENCOUNTER — Telehealth: Payer: Self-pay | Admitting: Internal Medicine

## 2013-03-11 NOTE — Telephone Encounter (Signed)
Pls sch OV w/Dr Felicity Coyer to discuss Thx

## 2013-03-11 NOTE — Telephone Encounter (Signed)
The patient is hoping to get a cheaper weight loss medication.  She states she can not afford the brand that was originally prescribed to her.   Thanks!

## 2013-03-11 NOTE — Telephone Encounter (Signed)
Notified pt with Dr. Posey Rea response. Pt states she is going to do some research first will call bck if she decide to get something else...lmb

## 2013-03-11 NOTE — Telephone Encounter (Signed)
Pls advise since md is out of office...Raechel Chute

## 2013-04-24 ENCOUNTER — Other Ambulatory Visit: Payer: Self-pay | Admitting: *Deleted

## 2013-04-24 ENCOUNTER — Other Ambulatory Visit: Payer: Self-pay | Admitting: Internal Medicine

## 2013-04-24 MED ORDER — FLUTICASONE PROPIONATE 50 MCG/ACT NA SUSP: 1.0000 | Freq: Every day | NASAL | Status: DC

## 2013-04-30 ENCOUNTER — Other Ambulatory Visit (HOSPITAL_COMMUNITY): Payer: Self-pay | Admitting: Interventional Radiology

## 2013-04-30 ENCOUNTER — Other Ambulatory Visit: Payer: Self-pay | Admitting: Radiology

## 2013-04-30 DIAGNOSIS — N2889 Other specified disorders of kidney and ureter: Secondary | ICD-10-CM

## 2013-04-30 DIAGNOSIS — D3001 Benign neoplasm of right kidney: Secondary | ICD-10-CM

## 2013-05-08 ENCOUNTER — Encounter: Payer: Self-pay | Admitting: Internal Medicine

## 2013-06-01 LAB — CREATININE WITH EST GFR
Creat: 0.9 mg/dL (ref 0.50–1.10)
GFR, Est African American: 82 mL/min
GFR, Est Non African American: 71 mL/min

## 2013-06-01 LAB — BUN: BUN: 20 mg/dL (ref 6–23)

## 2013-06-02 ENCOUNTER — Ambulatory Visit
Admission: RE | Admit: 2013-06-02 | Discharge: 2013-06-02 | Disposition: A | Discharge status: Home / Self Care | Payer: BC Managed Care – PPO | Source: Ambulatory Visit | Attending: Interventional Radiology | Admitting: Interventional Radiology

## 2013-06-02 ENCOUNTER — Ambulatory Visit (HOSPITAL_COMMUNITY)
Admission: RE | Admit: 2013-06-02 | Discharge: 2013-06-02 | Disposition: A | Discharge status: Home / Self Care | Payer: BC Managed Care – PPO | Source: Ambulatory Visit | Attending: Interventional Radiology | Admitting: Interventional Radiology

## 2013-06-02 ENCOUNTER — Encounter (HOSPITAL_COMMUNITY): Payer: Self-pay

## 2013-06-02 DIAGNOSIS — M47817 Spondylosis without myelopathy or radiculopathy, lumbosacral region: Secondary | ICD-10-CM | POA: Insufficient documentation

## 2013-06-02 DIAGNOSIS — Z85528 Personal history of other malignant neoplasm of kidney: Secondary | ICD-10-CM | POA: Insufficient documentation

## 2013-06-02 DIAGNOSIS — M4310 Spondylolisthesis, site unspecified: Secondary | ICD-10-CM | POA: Insufficient documentation

## 2013-06-02 DIAGNOSIS — N2889 Other specified disorders of kidney and ureter: Secondary | ICD-10-CM

## 2013-06-02 DIAGNOSIS — Z09 Encounter for follow-up examination after completed treatment for conditions other than malignant neoplasm: Secondary | ICD-10-CM | POA: Insufficient documentation

## 2013-06-02 MED ORDER — IOHEXOL 300 MG/ML  SOLN
100.0000 mL | Freq: Once | INTRAMUSCULAR | Status: AC | PRN
  Administered 2013-06-02: 100 mL via INTRAVENOUS

## 2013-06-02 NOTE — Progress Notes (Signed)
Denies hematuria.  Seen approximately 2 mos ago by Seward Grater, CRNP for c/o urinary retention.   Started on Rapaflo 8 mg po QD with improvement of Sx.  Denies discomfort or pain associated with procedure.  Britani Beattie Carmell Austria, RN 06/02/2013 11:51 AM

## 2013-06-17 ENCOUNTER — Other Ambulatory Visit: Payer: Self-pay | Admitting: Internal Medicine

## 2013-07-08 ENCOUNTER — Other Ambulatory Visit (INDEPENDENT_AMBULATORY_CARE_PROVIDER_SITE_OTHER): Payer: BC Managed Care – PPO

## 2013-07-08 ENCOUNTER — Encounter: Payer: Self-pay | Admitting: Internal Medicine

## 2013-07-08 ENCOUNTER — Ambulatory Visit (INDEPENDENT_AMBULATORY_CARE_PROVIDER_SITE_OTHER): Payer: BC Managed Care – PPO | Admitting: Internal Medicine

## 2013-07-08 VITALS — BP 112/72 | HR 62 | Temp 97.9°F | Wt 262.0 lb

## 2013-07-08 DIAGNOSIS — Z23 Encounter for immunization: Secondary | ICD-10-CM

## 2013-07-08 DIAGNOSIS — E7800 Pure hypercholesterolemia, unspecified: Secondary | ICD-10-CM

## 2013-07-08 DIAGNOSIS — I10 Essential (primary) hypertension: Secondary | ICD-10-CM

## 2013-07-08 DIAGNOSIS — Z0000 Encounter for general adult medical examination without abnormal findings: Secondary | ICD-10-CM

## 2013-07-08 DIAGNOSIS — R5383 Other fatigue: Secondary | ICD-10-CM

## 2013-07-08 DIAGNOSIS — R5381 Other malaise: Secondary | ICD-10-CM

## 2013-07-08 DIAGNOSIS — Z1239 Encounter for other screening for malignant neoplasm of breast: Secondary | ICD-10-CM

## 2013-07-08 DIAGNOSIS — M549 Dorsalgia, unspecified: Secondary | ICD-10-CM

## 2013-07-08 DIAGNOSIS — R7309 Other abnormal glucose: Secondary | ICD-10-CM

## 2013-07-08 DIAGNOSIS — M810 Age-related osteoporosis without current pathological fracture: Secondary | ICD-10-CM

## 2013-07-08 LAB — URINALYSIS, ROUTINE W REFLEX MICROSCOPIC
Bilirubin Urine: NEGATIVE
Hgb urine dipstick: NEGATIVE
Ketones, ur: NEGATIVE
Nitrite: NEGATIVE
Specific Gravity, Urine: 1.025 (ref 1.000–1.030)
Total Protein, Urine: NEGATIVE
Urine Glucose: NEGATIVE
Urobilinogen, UA: 0.2 (ref 0.0–1.0)
pH: 5.5 (ref 5.0–8.0)

## 2013-07-08 LAB — CBC WITH DIFFERENTIAL/PLATELET
Basophils Absolute: 0 10*3/uL (ref 0.0–0.1)
Basophils Relative: 0.4 % (ref 0.0–3.0)
Eosinophils Absolute: 0.1 10*3/uL (ref 0.0–0.7)
Eosinophils Relative: 1.4 % (ref 0.0–5.0)
HCT: 38.1 % (ref 36.0–46.0)
Hemoglobin: 12.7 g/dL (ref 12.0–15.0)
Lymphocytes Relative: 30.6 % (ref 12.0–46.0)
Lymphs Abs: 2.5 10*3/uL (ref 0.7–4.0)
MCHC: 33.2 g/dL (ref 30.0–36.0)
MCV: 93.4 fl (ref 78.0–100.0)
Monocytes Absolute: 0.6 10*3/uL (ref 0.1–1.0)
Monocytes Relative: 6.7 % (ref 3.0–12.0)
Neutro Abs: 5 10*3/uL (ref 1.4–7.7)
Neutrophils Relative %: 60.9 % (ref 43.0–77.0)
Platelets: 134 10*3/uL — ABNORMAL LOW (ref 150.0–400.0)
RBC: 4.08 Mil/uL (ref 3.87–5.11)
RDW: 14.6 % (ref 11.5–14.6)
WBC: 8.3 10*3/uL (ref 4.5–10.5)

## 2013-07-08 LAB — BASIC METABOLIC PANEL
BUN: 26 mg/dL — ABNORMAL HIGH (ref 6–23)
CO2: 29 mEq/L (ref 19–32)
Calcium: 9.8 mg/dL (ref 8.4–10.5)
Chloride: 105 mEq/L (ref 96–112)
Creatinine, Ser: 0.9 mg/dL (ref 0.4–1.2)
GFR: 84.89 mL/min (ref >60.00)
Glucose, Bld: 96 mg/dL (ref 70–99)
Potassium: 4.2 mEq/L (ref 3.5–5.1)
Sodium: 139 mEq/L (ref 135–145)

## 2013-07-08 LAB — LIPID PANEL
Cholesterol: 168 mg/dL (ref 0–200)
HDL: 52.9 mg/dL (ref >39.00)
LDL Cholesterol: 98 mg/dL (ref 0–99)
Total CHOL/HDL Ratio: 3
Triglycerides: 84 mg/dL (ref 0.0–149.0)
VLDL: 16.8 mg/dL (ref 0.0–40.0)

## 2013-07-08 LAB — HEPATIC FUNCTION PANEL
ALT: 15 U/L (ref 0–35)
AST: 13 U/L (ref 0–37)
Albumin: 3.7 g/dL (ref 3.5–5.2)
Alkaline Phosphatase: 87 U/L (ref 39–117)
Bilirubin, Direct: 0.1 mg/dL (ref 0.0–0.3)
Total Bilirubin: 0.6 mg/dL (ref 0.3–1.2)
Total Protein: 7.6 g/dL (ref 6.0–8.3)

## 2013-07-08 LAB — HEMOGLOBIN A1C: Hgb A1c MFr Bld: 5.9 % (ref 4.6–6.5)

## 2013-07-08 LAB — TSH: TSH: 3.03 u[IU]/mL (ref 0.35–5.50)

## 2013-07-08 MED ORDER — OMEPRAZOLE MAGNESIUM 20 MG PO TBEC: 20.0000 mg | DELAYED_RELEASE_TABLET | Freq: Every day | ORAL | Status: DC

## 2013-07-08 MED ORDER — FUROSEMIDE 20 MG PO TABS: 40.0000 mg | ORAL_TABLET | ORAL | Status: DC

## 2013-07-08 NOTE — Patient Instructions (Addendum)
It was good to see you today.  We have reviewed your prior records including labs, medications and tests today  Your annual flu shot was given and/or updated today.  Health Maintenance reviewed - all recommended immunizations and age-appropriate screenings are up-to-date.  Test(s) ordered today. Your results will be released to [facility] (or called to you) after review, usually within 72hours after test completion. If any changes need to be made, you will be notified at that same time.  Begin generic Prilosec for reflux - Your prescription(s) have been submitted to your pharmacy. Please take as directed and contact our office if you believe you are having problem(s) with the medication(s). Also refill on medication(s) as discussed today.  we'll make referral for mammo and bone density. Our office will contact you regarding appointment(s) once made.  Please schedule followup in 6 months for weight recheck and medication review, call sooner if problems.   Health Maintenance, Female A healthy lifestyle and preventative care can promote health and wellness.  Maintain regular health, dental, and eye exams.  Eat a healthy diet. Foods like vegetables, fruits, whole grains, low-fat dairy products, and lean protein foods contain the nutrients you need without too many calories. Decrease your intake of foods high in solid fats, added sugars, and salt. Get information about a proper diet from your caregiver, if necessary.  Regular physical exercise is one of the most important things you can do for your health. Most adults should get at least 150 minutes of moderate-intensity exercise (any activity that increases your heart rate and causes you to sweat) each week. In addition, most adults need muscle-strengthening exercises on 2 or more days a week.   Maintain a healthy weight. The body mass index (BMI) is a screening tool to identify possible weight problems. It provides an estimate of body fat based  on height and weight. Your caregiver can help determine your BMI, and can help you achieve or maintain a healthy weight. For adults 20 years and older:  A BMI below 18.5 is considered underweight.  A BMI of 18.5 to 24.9 is normal.  A BMI of 25 to 29.9 is considered overweight.  A BMI of 30 and above is considered obese.  Maintain normal blood lipids and cholesterol by exercising and minimizing your intake of saturated fat. Eat a balanced diet with plenty of fruits and vegetables. Blood tests for lipids and cholesterol should begin at age 45 and be repeated every 5 years. If your lipid or cholesterol levels are high, you are over 50, or you are a high risk for heart disease, you may need your cholesterol levels checked more frequently.Ongoing high lipid and cholesterol levels should be treated with medicines if diet and exercise are not effective.  If you smoke, find out from your caregiver how to quit. If you do not use tobacco, do not start.  Lung cancer screening is recommended for adults aged 53 80 years who are at high risk for developing lung cancer because of a history of smoking. Yearly low-dose computed tomography (CT) is recommended for people who have at least a 30-pack-year history of smoking and are a current smoker or have quit within the past 15 years. A pack year of smoking is smoking an average of 1 pack of cigarettes a day for 1 year (for example: 1 pack a day for 30 years or 2 packs a day for 15 years). Yearly screening should continue until the smoker has stopped smoking for at least 15 years.  Yearly screening should also be stopped for people who develop a health problem that would prevent them from having lung cancer treatment.  If you are pregnant, do not drink alcohol. If you are breastfeeding, be very cautious about drinking alcohol. If you are not pregnant and choose to drink alcohol, do not exceed 1 drink per day. One drink is considered to be 12 ounces (355 mL) of beer, 5  ounces (148 mL) of wine, or 1.5 ounces (44 mL) of liquor.  Avoid use of street drugs. Do not share needles with anyone. Ask for help if you need support or instructions about stopping the use of drugs.  High blood pressure causes heart disease and increases the risk of stroke. Blood pressure should be checked at least every 1 to 2 years. Ongoing high blood pressure should be treated with medicines, if weight loss and exercise are not effective.  If you are 36 to 58 years old, ask your caregiver if you should take aspirin to prevent strokes.  Diabetes screening involves taking a blood sample to check your fasting blood sugar level. This should be done once every 3 years, after age 62, if you are within normal weight and without risk factors for diabetes. Testing should be considered at a younger age or be carried out more frequently if you are overweight and have at least 1 risk factor for diabetes.  Breast cancer screening is essential preventative care for women. You should practice "breast self-awareness." This means understanding the normal appearance and feel of your breasts and may include breast self-examination. Any changes detected, no matter how small, should be reported to a caregiver. Women in their 14s and 30s should have a clinical breast exam (CBE) by a caregiver as part of a regular health exam every 1 to 3 years. After age 76, women should have a CBE every year. Starting at age 21, women should consider having a mammogram (breast X-ray) every year. Women who have a family history of breast cancer should talk to their caregiver about genetic screening. Women at a high risk of breast cancer should talk to their caregiver about having an MRI and a mammogram every year.  Breast cancer gene (BRCA)-related cancer risk assessment is recommended for women who have family members with BRCA-related cancers. BRCA-related cancers include breast, ovarian, tubal, and peritoneal cancers. Having family  members with these cancers may be associated with an increased risk for harmful changes (mutations) in the breast cancer genes BRCA1 and BRCA2. Results of the assessment will determine the need for genetic counseling and BRCA1 and BRCA2 testing.  The Pap test is a screening test for cervical cancer. Women should have a Pap test starting at age 73. Between ages 41 and 56, Pap tests should be repeated every 2 years. Beginning at age 36, you should have a Pap test every 3 years as long as the past 3 Pap tests have been normal. If you had a hysterectomy for a problem that was not cancer or a condition that could lead to cancer, then you no longer need Pap tests. If you are between ages 42 and 69, and you have had normal Pap tests going back 10 years, you no longer need Pap tests. If you have had past treatment for cervical cancer or a condition that could lead to cancer, you need Pap tests and screening for cancer for at least 20 years after your treatment. If Pap tests have been discontinued, risk factors (such as a new sexual partner)  need to be reassessed to determine if screening should be resumed. Some women have medical problems that increase the chance of getting cervical cancer. In these cases, your caregiver may recommend more frequent screening and Pap tests.  The human papillomavirus (HPV) test is an additional test that may be used for cervical cancer screening. The HPV test looks for the virus that can cause the cell changes on the cervix. The cells collected during the Pap test can be tested for HPV. The HPV test could be used to screen women aged 102 years and older, and should be used in women of any age who have unclear Pap test results. After the age of 77, women should have HPV testing at the same frequency as a Pap test.  Colorectal cancer can be detected and often prevented. Most routine colorectal cancer screening begins at the age of 36 and continues through age 32. However, your caregiver  may recommend screening at an earlier age if you have risk factors for colon cancer. On a yearly basis, your caregiver may provide home test kits to check for hidden blood in the stool. Use of a small camera at the end of a tube, to directly examine the colon (sigmoidoscopy or colonoscopy), can detect the earliest forms of colorectal cancer. Talk to your caregiver about this at age 70, when routine screening begins. Direct examination of the colon should be repeated every 5 to 10 years through age 75, unless early forms of pre-cancerous polyps or small growths are found.  Hepatitis C blood testing is recommended for all people born from [birth year] through 1965 and any individual with known risks for hepatitis C.  Practice safe sex. Use condoms and avoid high-risk sexual practices to reduce the spread of sexually transmitted infections (STIs). Sexually active women aged 63 and younger should be checked for Chlamydia, which is a common sexually transmitted infection. Older women with new or multiple partners should also be tested for Chlamydia. Testing for other STIs is recommended if you are sexually active and at increased risk.  Osteoporosis is a disease in which the bones lose minerals and strength with aging. This can result in serious bone fractures. The risk of osteoporosis can be identified using a bone density scan. Women ages 21 and over and women at risk for fractures or osteoporosis should discuss screening with their caregivers. Ask your caregiver whether you should be taking a calcium supplement or vitamin D to reduce the rate of osteoporosis.  Menopause can be associated with physical symptoms and risks. Hormone replacement therapy is available to decrease symptoms and risks. You should talk to your caregiver about whether hormone replacement therapy is right for you.  Use sunscreen. Apply sunscreen liberally and repeatedly throughout the day. You should seek shade when your shadow is shorter than  you. Protect yourself by wearing long sleeves, pants, a wide-brimmed hat, and sunglasses year round, whenever you are outdoors.  Notify your caregiver of new moles or changes in moles, especially if there is a change in shape or color. Also notify your caregiver if a mole is larger than the size of a pencil eraser.  Stay current with your immunizations. Document Released: 12/25/2010 Document Revised: 10/06/2012 Document Reviewed: 12/25/2010 [facility] Patient Information 2014 [place]. Health Maintenance, Female A healthy lifestyle and preventative care can promote health and wellness.  Maintain regular health, dental, and eye exams.  Eat a healthy diet. Foods like vegetables, fruits, whole grains, low-fat dairy products, and lean protein foods contain the  nutrients you need without too many calories. Decrease your intake of foods high in solid fats, added sugars, and salt. Get information about a proper diet from your caregiver, if necessary.  Regular physical exercise is one of the most important things you can do for your health. Most adults should get at least 150 minutes of moderate-intensity exercise (any activity that increases your heart rate and causes you to sweat) each week. In addition, most adults need muscle-strengthening exercises on 2 or more days a week.   Maintain a healthy weight. The body mass index (BMI) is a screening tool to identify possible weight problems. It provides an estimate of body fat based on height and weight. Your caregiver can help determine your BMI, and can help you achieve or maintain a healthy weight. For adults 20 years and older:  A BMI below 18.5 is considered underweight.  A BMI of 18.5 to 24.9 is normal.  A BMI of 25 to 29.9 is considered overweight.  A BMI of 30 and above is considered obese.  Maintain normal blood lipids and cholesterol by exercising and minimizing your intake of saturated fat. Eat a balanced diet with plenty of fruits  and vegetables. Blood tests for lipids and cholesterol should begin at age 46 and be repeated every 5 years. If your lipid or cholesterol levels are high, you are over 50, or you are a high risk for heart disease, you may need your cholesterol levels checked more frequently.Ongoing high lipid and cholesterol levels should be treated with medicines if diet and exercise are not effective.  If you smoke, find out from your caregiver how to quit. If you do not use tobacco, do not start.  Lung cancer screening is recommended for adults aged 15 80 years who are at high risk for developing lung cancer because of a history of smoking. Yearly low-dose computed tomography (CT) is recommended for people who have at least a 30-pack-year history of smoking and are a current smoker or have quit within the past 15 years. A pack year of smoking is smoking an average of 1 pack of cigarettes a day for 1 year (for example: 1 pack a day for 30 years or 2 packs a day for 15 years). Yearly screening should continue until the smoker has stopped smoking for at least 15 years. Yearly screening should also be stopped for people who develop a health problem that would prevent them from having lung cancer treatment.  If you are pregnant, do not drink alcohol. If you are breastfeeding, be very cautious about drinking alcohol. If you are not pregnant and choose to drink alcohol, do not exceed 1 drink per day. One drink is considered to be 12 ounces (355 mL) of beer, 5 ounces (148 mL) of wine, or 1.5 ounces (44 mL) of liquor.  Avoid use of street drugs. Do not share needles with anyone. Ask for help if you need support or instructions about stopping the use of drugs.  High blood pressure causes heart disease and increases the risk of stroke. Blood pressure should be checked at least every 1 to 2 years. Ongoing high blood pressure should be treated with medicines, if weight loss and exercise are not effective.  If you are 16 to 58  years old, ask your caregiver if you should take aspirin to prevent strokes.  Diabetes screening involves taking a blood sample to check your fasting blood sugar level. This should be done once every 3 years, after age 53, if  you are within normal weight and without risk factors for diabetes. Testing should be considered at a younger age or be carried out more frequently if you are overweight and have at least 1 risk factor for diabetes.  Breast cancer screening is essential preventative care for women. You should practice "breast self-awareness." This means understanding the normal appearance and feel of your breasts and may include breast self-examination. Any changes detected, no matter how small, should be reported to a caregiver. Women in their 31s and 30s should have a clinical breast exam (CBE) by a caregiver as part of a regular health exam every 1 to 3 years. After age 26, women should have a CBE every year. Starting at age 43, women should consider having a mammogram (breast X-ray) every year. Women who have a family history of breast cancer should talk to their caregiver about genetic screening. Women at a high risk of breast cancer should talk to their caregiver about having an MRI and a mammogram every year.  Breast cancer gene (BRCA)-related cancer risk assessment is recommended for women who have family members with BRCA-related cancers. BRCA-related cancers include breast, ovarian, tubal, and peritoneal cancers. Having family members with these cancers may be associated with an increased risk for harmful changes (mutations) in the breast cancer genes BRCA1 and BRCA2. Results of the assessment will determine the need for genetic counseling and BRCA1 and BRCA2 testing.  The Pap test is a screening test for cervical cancer. Women should have a Pap test starting at age 22. Between ages 16 and 59, Pap tests should be repeated every 2 years. Beginning at age 76, you should have a Pap test every 3  years as long as the past 3 Pap tests have been normal. If you had a hysterectomy for a problem that was not cancer or a condition that could lead to cancer, then you no longer need Pap tests. If you are between ages 37 and 28, and you have had normal Pap tests going back 10 years, you no longer need Pap tests. If you have had past treatment for cervical cancer or a condition that could lead to cancer, you need Pap tests and screening for cancer for at least 20 years after your treatment. If Pap tests have been discontinued, risk factors (such as a new sexual partner) need to be reassessed to determine if screening should be resumed. Some women have medical problems that increase the chance of getting cervical cancer. In these cases, your caregiver may recommend more frequent screening and Pap tests.  The human papillomavirus (HPV) test is an additional test that may be used for cervical cancer screening. The HPV test looks for the virus that can cause the cell changes on the cervix. The cells collected during the Pap test can be tested for HPV. The HPV test could be used to screen women aged 35 years and older, and should be used in women of any age who have unclear Pap test results. After the age of 49, women should have HPV testing at the same frequency as a Pap test.  Colorectal cancer can be detected and often prevented. Most routine colorectal cancer screening begins at the age of 15 and continues through age 71. However, your caregiver may recommend screening at an earlier age if you have risk factors for colon cancer. On a yearly basis, your caregiver may provide home test kits to check for hidden blood in the stool. Use of a small camera at the end  of a tube, to directly examine the colon (sigmoidoscopy or colonoscopy), can detect the earliest forms of colorectal cancer. Talk to your caregiver about this at age 81, when routine screening begins. Direct examination of the colon should be repeated every 5  to 10 years through age 78, unless early forms of pre-cancerous polyps or small growths are found.  Hepatitis C blood testing is recommended for all people born from [birth year] through 1965 and any individual with known risks for hepatitis C.  Practice safe sex. Use condoms and avoid high-risk sexual practices to reduce the spread of sexually transmitted infections (STIs). Sexually active women aged 18 and younger should be checked for Chlamydia, which is a common sexually transmitted infection. Older women with new or multiple partners should also be tested for Chlamydia. Testing for other STIs is recommended if you are sexually active and at increased risk.  Osteoporosis is a disease in which the bones lose minerals and strength with aging. This can result in serious bone fractures. The risk of osteoporosis can be identified using a bone density scan. Women ages 50 and over and women at risk for fractures or osteoporosis should discuss screening with their caregivers. Ask your caregiver whether you should be taking a calcium supplement or vitamin D to reduce the rate of osteoporosis.  Menopause can be associated with physical symptoms and risks. Hormone replacement therapy is available to decrease symptoms and risks. You should talk to your caregiver about whether hormone replacement therapy is right for you.  Use sunscreen. Apply sunscreen liberally and repeatedly throughout the day. You should seek shade when your shadow is shorter than you. Protect yourself by wearing long sleeves, pants, a wide-brimmed hat, and sunglasses year round, whenever you are outdoors.  Notify your caregiver of new moles or changes in moles, especially if there is a change in shape or color. Also notify your caregiver if a mole is larger than the size of a pencil eraser.  Stay current with your immunizations. Document Released: 12/25/2010 Document Revised: 10/06/2012 Document Reviewed: 12/25/2010 [facility] Patient Information  2014 [place]. Diabetes, Type 2, Am I At Risk? Diabetes is a lasting (chronic) disease. In type 2 diabetes, the pancreas does not make enough insulin, and the body does not respond normally to the insulin that is made. This type of diabetes was also previously called adult onset diabetes. About 90% of all those who have diabetes have type 2. It usually occurs after the age of 61, but can occur at any age.  People develop type 2 diabetes because they do not use insulin properly. Eventually, the pancreas cannot make enough insulin for the body's needs. Over time, the amount of glucose (sugar) in the blood increases. RISK FACTORS  Overweight  the more weight you have, the more resistant your cells become to insulin.  Family history  you are more likely to get diabetes if a parent or sibling has diabetes.  Race certain races get diabetes more.  African Americans.  American Indians.  Asian Americans.  Hispanics.  Pacific Islander.  Inactive exercise helps control weight and helps your cells be more sensitive to insulin.  Gestational diabetes  some women develop diabetes while they are pregnant. This goes away when they deliver. However, they are 50-60% more likely to develop type 2 diabetes at a later time.  Having a baby over 9 pounds  a sign that you may have had gestational diabetes.  Age the risk of diabetes goes up as you get older,  especially after age 24.  High blood pressure (hypertension). SYMPTOMS Many people have no signs or symptoms. Symptoms can be so mild that you might not even notice them. Some of these signs are:  Increased thirst.  Increased hunger.  Tiredness (fatigue).  Increased urination, especially at night.  Weight loss.  Blurred vision.  Sores that do not heal. WHO SHOULD BE TESTED?  Anyone 63 years or older, especially if overweight, should consider getting tested.  If you are younger than 78, overweight, and have one or more of the risk  factors, you should consider getting tested. DIAGNOSIS  Fasting blood glucose (FBS). Usually, 2 are done.  FBS 101-125 mg/dl is considered pre-diabetes.  FBS 126 mg/dl or greater is considered diabetes.  2 hour Oral Glucose Tolerance Test (OGTT). This test is preformed by first having you not eat or drink for several hours. You are then given something sweet to drink and your blood glucose is measured fasting, at one hour and 2 hours. This test tells how well you are able to handle sugars or carbohydrates.  Fasting: 60-100 mg/dl.  1 hour: less than 200 mg/dl.  2 hours: less than 140 mg/dl.  A1c A1c is a blood glucose test that gives and average of your blood glucose over 3 months. It is the accepted method to use to diagnose diabetes.  A1c 5.7-6.4% is considered pre-diabetes.  A1c 6.5% or greater is considered diabetes. WHAT DOES IT MEAN TO HAVE PRE-DIABETES? Pre-diabetes means you are at risk for getting type 2 diabetes. Your blood glucose is higher than normal, but not yet high enough to diagnose diabetes. The good news is, if you have pre-diabetes you can reduce the risk of getting diabetes and even return to normal blood glucose levels. With modest weight loss and moderate physical activity, you can delay or prevent type 2 diabetes.  PREVENTION You cannot do anything about race, age or family history, but you can lower your chances of getting diabetes. You can:   Exercise regularly and be active.  Reduce fat and calorie intake.  Make wise food choices as much as you can.  Reduce your intake of salt and alcohol.  Maintain a reasonable weight.  Keep blood pressure in an acceptable range. Take medication if needed.  Not smoke.  Maintain an acceptable cholesterol level (HDL, LDL, Triglycerides). Take medication if needed. DOING MY PART: GETTING STARTED Making big changes in your life is hard, especially if you are faced with more than one change. You can make it easier by  taking these steps:  Make a plan to change behavior.  Decide exactly what you will do and when you will do it.  Plan what you need to get ready.  Think about what might prevent you from reaching your goals.  Find family and friends who will support and encourage you.  Decide how you will reward yourself when you do what you have planned.  Your doctor, dietitian, or counselor can help you make a plan. HERE ARE SOME OF THE AREAS YOU MAY WISH TO CHANGE TO REDUCE YOUR RISK OF DIABETES. If you are overweight or obese, choose sensible ways to get in shape. Even small amounts of weight loss, like 5-10 pounds, can help reduce the effects of insulin resistance and help blood glucose control. Diet  Avoid crash diets. Instead, eat less of the foods you usually have. Limit the amount of fat you eat.  Increase your physical activity. Aim for at least 30 minutes of exercise  most days of the week.  Set a reasonable weight-loss goal, such as losing 1 pound a week. Aim for a long-term goal of losing 5-7% of your total body weight.  Make wise food choices most of the time.  What you eat has a big impact on your health. By making wise food choices, you can help control your body weight, blood pressure, and cholesterol.  Take a hard look at the serving sizes of the foods you eat. Reduce serving sizes of meat, desserts, and foods high in fat. Increase your intake of fruits and vegetables.  Limit your fat intake to about 25% of your total calories. For example, if your food choices add up to about 2,000 calories a day, try to eat no more than 56 grams of fat. Your caregiver or a dietitian can help you figure out how much fat to have. You can check food labels for fat content too.  You may also want to reduce the number of calories you have each day.  Keep a food log. Write down what you eat, how much you eat, and anything else that helps keep you on track.  When you meet your goal, reward yourself  with a nonfood item or activity. Exercise  Be physically active every day.  Keep and exercise log. Write down what exercise you did, for how long, and anything else that keeps you on track.  Regular exercise (like brisk walking) tackles several risk factors at once. It helps you lose weight, it keeps your cholesterol and blood pressure under control, and it helps your body use insulin. People who are physically active for 30 minutes a day, 5 days a week, reduced their risk of type 2 diabetes. If you are not very active, you should start slowly at first. Talk with your caregiver first about what kinds of exercise would be safe for you. Make a plan to increase your activity level with the goal of being active for at least 30 minutes a day, most days of the week.  Choose activities you enjoy. Here are some ways to work extra activity into your daily routine:  Take the stairs rather than an elevator or escalator.  Park at the far end of the lot and walk.  Get off the bus a few stops early and walk the rest of the way.  Walk or bicycle instead of drive whenever you can. Medications Some people need medication to help control their blood pressure or cholesterol levels. If you do, take your medicines as directed. Ask your caregiver whether there are any medicines you can take to prevent type 2 diabetes. Document Released: 06/14/2003 Document Revised: 09/03/2011 Document Reviewed: 03/09/2009 [facility] Patient Information 2014 [place].

## 2013-07-08 NOTE — Assessment & Plan Note (Signed)
Diet controlled (take cinnamon herbal tx) Check a1c now Lab Results  Component Value Date   HGBA1C 6.1 01/07/2013

## 2013-07-08 NOTE — Assessment & Plan Note (Signed)
BP Readings from Last 3 Encounters:  07/08/13 112/72  06/02/13 127/74  01/07/13 122/82   The current medical regimen is effective Stopped ACEI due to cough 06/2012-  Now on ARB and HCT combo but variable compliance Added inderal 04/2012 -

## 2013-07-08 NOTE — Assessment & Plan Note (Signed)
DEXA January 2014 reviewed in depth today started Fosamax weekly  07/2012 with increased supplemental calcium and vitamin D and Educated to increase weightbearing exercise activity plan to recheck now (letter from [facility]) 

## 2013-07-08 NOTE — Progress Notes (Signed)
Pre-visit discussion using our clinic review tool. No additional management support is needed unless otherwise documented below in the visit note.  

## 2013-07-08 NOTE — Progress Notes (Signed)
Subjective:    Patient ID: Janet Le, female    DOB: 12/05/1955, 58 y.o.   MRN: [id number]  HPI   Here for medicare wellness  Diet: heart healthy or DM if diabetic Physical activity: sedentary Depression/mood screen: negative Hearing: intact to whispered voice Visual acuity: grossly normal, performs annual eye exam  ADLs: capable Fall risk: none Home safety: good Cognitive evaluation: intact to orientation, naming, recall and repetition EOL planning: adv directives, full code/ I agree  I have personally reviewed and have noted 1. The patient's medical and social history 2. Their use of alcohol, tobacco or illicit drugs 3. Their current medications and supplements 4. The patient's functional ability including ADL's, fall risks, home safety risks and hearing or visual impairment. 5. Diet and physical activities 6. Evidence for depression or mood disorders  -  also reviewed chronic medical issues:  R RCC dx 11/2010 - working with uro/IR on same - s/p IR cryoablation 02/16/11  Anxiety, chronic symptoms - exacerbated by family stressors re: guardianship of g-son, separation from spouse (ongoing since 2007); symptoms associated with panic attacks and chest pain  - Stopped sertraline 11/2010 due to ?side effects  (slurring speech) -Intermittently working with psyc dr. Toy Care on same -ineffective relief with cymbalta, takes amitriptyline for sleep as needed  hypertension - variable compliance use of cost concerns - no increase in edema or change in chronic headache   diet controlled DM hx - watching carbs - take cinnamon prn but no med rx - does not check home cbg regularly  Osteoporosis - dx 07/2012 - now on weekly Fosamax and Ca+D - denies falls or bone pain, no painful swallow   Past Medical History  Diagnosis Date  . GERD (gastroesophageal reflux disease)   . Hypertension   . Chronic constipation   . Hiatal hernia   . Endometriosis   . Syncopal episodes     since  childhood  . Chronic sinusitis   . Osteoarthritis   . Hyperlipidemia   . Allergic rhinitis, cause unspecified   . Renal cell cancer 11/2010 dx    R, s/p cryoablation 02/16/11  . Irritable bowel syndrome   . Hemorrhoids   . Esophageal stricture   . Hiatal hernia   . Diabetes mellitus     diet controlled   Family History  Problem Relation Age of Onset  . Diabetes Mother   . Kidney disease Father   . Prostate cancer Father   . Colitis Brother   . Leukemia Brother   . Multiple sclerosis Sister   . Colon polyps    . Colon cancer Neg Hx    History  Substance Use Topics  . Smoking status: Never Smoker   . Smokeless tobacco: Never Used  . Alcohol Use: No     Comment: rare    Review of Systems  Constitutional: Positive for fatigue (chronic) and unexpected weight change (gain). Negative for fever.  Eyes: Negative for pain and visual disturbance.       Chronic dry eye  Respiratory: Negative for cough, shortness of breath and wheezing.   Cardiovascular: Negative for chest pain, palpitations and leg swelling.  Musculoskeletal: Positive for back pain (chronic, mild - exac with activity/walking, no trauma or falls). Negative for joint swelling.  Neurological: Negative for numbness and headaches.  Psychiatric/Behavioral: Positive for sleep disturbance and dysphoric mood (chronic). Negative for hallucinations, confusion, self-injury and agitation. The patient is not nervous/anxious.        Objective:   Physical Exam  BP 112/72  Pulse 62  Temp(Src) 97.9 F (36.6 C) (Oral)  Wt 262 lb (118.842 kg)  SpO2 98% Wt Readings from Last 3 Encounters:  07/08/13 262 lb (118.842 kg)  01/07/13 252 lb 12.8 oz (114.669 kg)  07/30/12 254 lb 3.2 oz (115.304 kg)   Constitutional: She is overweight but appears well-developed and well-nourished. No distress.  Eyes: prominent with B eyelid edema (mild) Neck: Normal range of motion. Neck supple. No JVD present. No thyromegaly present.   Cardiovascular: Normal rate, regular rhythm and normal heart sounds.  No murmur heard. chronic 2+ edema Pulmonary/Chest: Effort normal and breath sounds normal. No respiratory distress. She has no wheezes.   Mskel:Back: full range of motion of thoracic and lumbar spine. Non tender to palpation. Negative straight leg raise. DTR's are symmetrically intact. Sensation intact in all dermatomes of the lower extremities. Full strength to manual muscle testing. patient is able to heel toe walk without difficulty and ambulates with antalgic gait. Neurological: She is alert and oriented to person, place, and time. No cranial nerve deficit. Coordination, speech and balance normal. Good strength in all extremities, no drift Psychiatric: She has min dysphoric and anxious mood/affect. Her behavior is normal. Judgment and thought content normal.   Lab Results  Component Value Date   WBC 5.9 07/01/2012   HGB 12.7 07/01/2012   HCT 37.7 07/01/2012   PLT 128.0* 07/01/2012   CHOL 220* 07/01/2012   TRIG 69.0 07/01/2012   HDL 54.20 07/01/2012   LDLDIRECT 143.8 07/01/2012   ALT 20 07/01/2012   AST 31 07/01/2012   NA 138 07/01/2012   K 4.2 07/01/2012   CL 105 07/01/2012   CREATININE 0.90 06/01/2013   BUN 20 06/01/2013   CO2 29 07/01/2012   TSH 3.01 07/01/2012   INR 0.91 02/08/2011   HGBA1C 6.1 01/07/2013   MICROALBUR 0.8 01/07/2013       Assessment & Plan:   CPX/v70.0 - Patient has been counseled on age-appropriate routine health concerns for screening and prevention. These are reviewed and up-to-date. Immunizations are up-to-date or declined. Labs ordered and reviewed.  Fatigue - nonspecific symptoms/exam - check screening labs  Back pain - chronic - no injury or neuro deficit - suspect stain related to weight gain - refer to sport med - symptomatic care advised and home PT  Also see problem list. Medications and labs reviewed today.

## 2013-07-08 NOTE — Assessment & Plan Note (Signed)
rx'd low dose prava as LDL>100 12/2012 Weight trends reviewed The patient is asked to make an attempt to improve diet and exercise patterns to aid in medical management of this problem. Check annually 

## 2013-07-15 ENCOUNTER — Telehealth: Payer: Self-pay | Admitting: Gastroenterology

## 2013-07-15 ENCOUNTER — Other Ambulatory Visit (INDEPENDENT_AMBULATORY_CARE_PROVIDER_SITE_OTHER): Payer: BC Managed Care – PPO

## 2013-07-15 ENCOUNTER — Ambulatory Visit (INDEPENDENT_AMBULATORY_CARE_PROVIDER_SITE_OTHER): Payer: BC Managed Care – PPO | Admitting: Family Medicine

## 2013-07-15 ENCOUNTER — Ambulatory Visit (INDEPENDENT_AMBULATORY_CARE_PROVIDER_SITE_OTHER)
Admission: RE | Admit: 2013-07-15 | Discharge: 2013-07-15 | Disposition: A | Discharge status: Home / Self Care | Payer: BC Managed Care – PPO | Source: Ambulatory Visit | Attending: Family Medicine | Admitting: Family Medicine

## 2013-07-15 ENCOUNTER — Encounter: Payer: Self-pay | Admitting: Family Medicine

## 2013-07-15 VITALS — BP 136/80 | HR 74 | Temp 97.8°F | Resp 18 | Wt 259.4 lb

## 2013-07-15 DIAGNOSIS — M25569 Pain in unspecified knee: Secondary | ICD-10-CM

## 2013-07-15 DIAGNOSIS — M1710 Unilateral primary osteoarthritis, unspecified knee: Secondary | ICD-10-CM

## 2013-07-15 DIAGNOSIS — M5416 Radiculopathy, lumbar region: Secondary | ICD-10-CM

## 2013-07-15 DIAGNOSIS — IMO0002 Reserved for concepts with insufficient information to code with codable children: Secondary | ICD-10-CM

## 2013-07-15 MED ORDER — OMEPRAZOLE MAGNESIUM 20 MG PO TBEC: 20.0000 mg | DELAYED_RELEASE_TABLET | Freq: Every day | ORAL | Status: DC

## 2013-07-15 NOTE — Assessment & Plan Note (Signed)
Patient likely has severe osteoarthritis of the right knee. We did try a steroid injection today under ultrasound guidance secondary to patient's body habitus. Patient was given home exercise program and we'll go to formal physical therapy. Discuss different orthotics they can be helpful and the importance of weight loss. Patient will try these interventions and come back in 4 weeks. Patient continues to have pain we can consider viscous supplementation.

## 2013-07-15 NOTE — Patient Instructions (Addendum)
Very nice to meet you.  You have osteoarthritis.  Lets try some specific exercises for your back.. Try to do them most days of the week.  Take tylenol 650 mg three times a day is the best evidence based medicine we have for arthritis.  Glucosamine sulfate 750mg  twice a day is a supplement that has been shown to help moderate to severe arthritis. Vitamin D 2000 IU daily Fish oil 2 grams daily.  Tumeric 500mg  twice daily.  Capsaicin topically up to four times a day may also help with pain. It's important that you continue to stay active. Controlling your weight is important. Every pound you lose will be 4 pounds to your knee.  Consider insoles for your shoes called spenco that you can get at [employer] or [employer].  Physical therapy will be calling you.  Water aerobics and cycling with low resistance are the best two types of exercise for arthritis. Consider the Synvisc injection and check with your insurance. If you want we can start this at your next appointment.  Come back and see me in 4 weeks.

## 2013-07-15 NOTE — Progress Notes (Signed)
Pre-visit discussion using our clinic review tool. No additional management support is needed unless otherwise documented below in the visit note.  

## 2013-07-15 NOTE — Telephone Encounter (Signed)
Pt reports Dr Asa Lente was to order a PPI, but the pharmacy did not get it. Asked if she wanted Omeprazole and she agreed; resent to [facility].

## 2013-07-15 NOTE — Progress Notes (Signed)
I'm seeing this patient by the request  of:  Gwendolyn Grant, MD   CC: Back pain, right knee pain.   HPI: Patient is a pleasant 58 year old female coming in with a history of back pain. Patient does have a past medical history significant for osteoporosis on weekly Fosamax and calcium with vitamin D.  Patient does have a past medical history significant for right kidney cancer that has been followed with CT scans. Patient's last CT scan June 02, 2013 showed lower lumbar degenerative facet arthropathy with grade 1 anterior listhesis at L5-S1. This was reviewed by me today. Patient states she has tried many different medications in the past without any significant improvement. Patient has had a lot of allergies to medications but continues to take meloxicam, tramadol. Patient states that her back pain seems to be worse after standing or walking for long amount of time. 4/10 in severity  Patient is also complaining of right knee pain. Patient has been told that she has arthritis in this knee previously. Patient states that the pain is worse with long walking or going up and downstairs. Patient states the pain is mostly on the medial aspect, no radiation no numbness. Patient's family history is significant for knee replacements in her mother as well as sister in their 60s. Patient denies any nighttime awakening. Patient rates his pain 6/10 in severity   Past medical, surgical, family and social history reviewed. Medications reviewed all in the electronic medical record.   Review of Systems: No headache, visual changes, nausea, vomiting, diarrhea, constipation, dizziness, abdominal pain, skin rash, fevers, chills, night sweats, weight loss, swollen lymph nodes, body aches, joint swelling, muscle aches, chest pain, shortness of breath, mood changes.   Objective:    Blood pressure 136/80, pulse 74, temperature 97.8 F (36.6 C), temperature source Oral, resp. rate 18, weight 259 lb 6.4 oz (117.663  kg), SpO2 97.00%.   General: No apparent distress alert and oriented x3 mood and affect normal, dressed appropriately.  HEENT: Pupils equal, extraocular movements intact Respiratory: Patient's speak in full sentences and does not appear short of breath Cardiovascular: No lower extremity edema, non tender, no erythema Skin: Warm dry intact with no signs of infection or rash on extremities or on axial skeleton. Abdomen: Soft nontender Neuro: Cranial nerves II through XII are intact, neurovascularly intact in all extremities with 2+ DTRs and 2+ pulses. Lymph: No lymphadenopathy of posterior or anterior cervical chain or axillae bilaterally.  Gait normal with good balance and coordination.  MSK: Non tender with full range of motion and good stability and symmetric strength and tone of shoulders, elbows, wrist, hip,  and ankles bilaterally.  Back Exam:  Inspection: Unremarkable  Motion: Flexion 45 deg, Extension 45 deg, Side Bending to 45 deg bilaterally,  Rotation to 45 deg bilaterally  SLR laying: Negative  XSLR laying: Positive left  Palpable tenderness: Tenderness of the left paraspinal muscle tear of the lumbar spine. FABER: negative. Sensory change: Gross sensation intact to all lumbar and sacral dermatomes.  Reflexes: 2+ at both patellar tendons, 2+ at achilles tendons, Babinski's downgoing.  Strength at foot  Plantar-flexion: 5/5 Dorsi-flexion: 5/5 Eversion: 5/5 Inversion: 5/5  Leg strength  Quad: 5/5 Hamstring: 5/5 Hip flexor: 5/5 Hip abductors: 5/5  Gait mild antalgic.  Knee: Right Inspection is difficult to find landmarks secondary to patient's body habitus. Palpation shows tenderness to palpation of the medial joint line. ROM full in flexion and extension and lower leg rotation. Ligaments with solid consistent endpoints including  ACL, PCL, LCL, MCL. Negative Mcmurray's, Apley's, and Thessalonian tests. painful patellar compression. Patellar glide with crepitus. Patellar and  quadriceps tendons unremarkable. Hamstring and quadriceps strength is normal.   Procedure: Real-time Ultrasound Guided Injection of right knee secondary to patient's body habitus. Device: GE Logiq E  Ultrasound guided injection is preferred based studies that show increased duration, increased effect, greater accuracy, decreased procedural pain, increased response rate, and decreased cost with ultrasound guided versus blind injection.  Verbal informed consent obtained.  Time-out conducted.  Noted no overlying erythema, induration, or other signs of local infection.  Skin prepped in a sterile fashion.  Local anesthesia: Topical Ethyl chloride.  With sterile technique and under real time ultrasound guidance: The right knee with a anterior lateral approach.  4 cc of 0.5% Marcaine and 1 cc of Kenalog 40 mg/dL Completed without difficulty  Pain immediately resolved suggesting accurate placement of the medication.  Advised to call if fevers/chills, erythema, induration, drainage, or persistent bleeding.  Images permanently stored and available for review in the ultrasound unit.  Impression: Technically successful ultrasound guided injection.   Impression and Recommendations:     This case required medical decision making of moderate complexity.

## 2013-07-15 NOTE — Assessment & Plan Note (Signed)
Patient does have imaging that does show facet arthropathy at L5-S1. Based on patient's symptoms this could correspond to some nerve root impingement. We'll change medications by increasing the patient's Elavil to 20 mg daily Discussed vitamin D supplementation, and other over-the-counter medications and can be beneficial. Home exercise program given. We did discuss formal physical therapy as well.

## 2013-07-22 ENCOUNTER — Ambulatory Visit: Payer: BC Managed Care – PPO | Attending: Family Medicine | Admitting: Physical Therapy

## 2013-07-22 DIAGNOSIS — R293 Abnormal posture: Secondary | ICD-10-CM | POA: Insufficient documentation

## 2013-07-22 DIAGNOSIS — M25569 Pain in unspecified knee: Secondary | ICD-10-CM | POA: Insufficient documentation

## 2013-07-22 DIAGNOSIS — R262 Difficulty in walking, not elsewhere classified: Secondary | ICD-10-CM | POA: Insufficient documentation

## 2013-07-22 DIAGNOSIS — IMO0001 Reserved for inherently not codable concepts without codable children: Secondary | ICD-10-CM | POA: Insufficient documentation

## 2013-07-22 DIAGNOSIS — M545 Low back pain, unspecified: Secondary | ICD-10-CM | POA: Insufficient documentation

## 2013-07-22 DIAGNOSIS — M810 Age-related osteoporosis without current pathological fracture: Secondary | ICD-10-CM | POA: Insufficient documentation

## 2013-07-22 DIAGNOSIS — R269 Unspecified abnormalities of gait and mobility: Secondary | ICD-10-CM | POA: Insufficient documentation

## 2013-07-22 DIAGNOSIS — R5381 Other malaise: Secondary | ICD-10-CM | POA: Insufficient documentation

## 2013-07-22 DIAGNOSIS — M2550 Pain in unspecified joint: Secondary | ICD-10-CM | POA: Insufficient documentation

## 2013-07-23 ENCOUNTER — Ambulatory Visit (HOSPITAL_COMMUNITY)
Admission: RE | Admit: 2013-07-23 | Discharge: 2013-07-23 | Disposition: A | Discharge status: Home / Self Care | Payer: BC Managed Care – PPO | Source: Ambulatory Visit | Attending: Internal Medicine | Admitting: Internal Medicine

## 2013-07-23 ENCOUNTER — Encounter: Payer: Self-pay | Admitting: Internal Medicine

## 2013-07-23 DIAGNOSIS — M810 Age-related osteoporosis without current pathological fracture: Secondary | ICD-10-CM

## 2013-07-23 DIAGNOSIS — Z780 Asymptomatic menopausal state: Secondary | ICD-10-CM | POA: Insufficient documentation

## 2013-07-23 DIAGNOSIS — Z1239 Encounter for other screening for malignant neoplasm of breast: Secondary | ICD-10-CM

## 2013-07-23 DIAGNOSIS — Z1231 Encounter for screening mammogram for malignant neoplasm of breast: Secondary | ICD-10-CM | POA: Insufficient documentation

## 2013-07-23 DIAGNOSIS — Z13820 Encounter for screening for osteoporosis: Secondary | ICD-10-CM | POA: Insufficient documentation

## 2013-07-27 ENCOUNTER — Encounter: Payer: BC Managed Care – PPO | Admitting: Rehabilitation

## 2013-07-29 ENCOUNTER — Other Ambulatory Visit: Payer: Self-pay | Admitting: Internal Medicine

## 2013-07-30 ENCOUNTER — Telehealth: Payer: Self-pay | Admitting: Internal Medicine

## 2013-07-30 ENCOUNTER — Ambulatory Visit: Payer: BC Managed Care – PPO | Attending: Family Medicine | Admitting: Physical Therapy

## 2013-07-30 DIAGNOSIS — IMO0001 Reserved for inherently not codable concepts without codable children: Secondary | ICD-10-CM | POA: Insufficient documentation

## 2013-07-30 DIAGNOSIS — R293 Abnormal posture: Secondary | ICD-10-CM | POA: Insufficient documentation

## 2013-07-30 DIAGNOSIS — M545 Low back pain, unspecified: Secondary | ICD-10-CM | POA: Insufficient documentation

## 2013-07-30 DIAGNOSIS — M810 Age-related osteoporosis without current pathological fracture: Secondary | ICD-10-CM

## 2013-07-30 DIAGNOSIS — M2550 Pain in unspecified joint: Secondary | ICD-10-CM | POA: Insufficient documentation

## 2013-07-30 DIAGNOSIS — M25569 Pain in unspecified knee: Secondary | ICD-10-CM | POA: Insufficient documentation

## 2013-07-30 DIAGNOSIS — R262 Difficulty in walking, not elsewhere classified: Secondary | ICD-10-CM | POA: Insufficient documentation

## 2013-07-30 DIAGNOSIS — R269 Unspecified abnormalities of gait and mobility: Secondary | ICD-10-CM | POA: Insufficient documentation

## 2013-07-30 DIAGNOSIS — R5381 Other malaise: Secondary | ICD-10-CM | POA: Insufficient documentation

## 2013-07-30 NOTE — Telephone Encounter (Signed)
Please call patient. Bone density scan is stable. No further bone loss since last year. Continue Fosamax once weekly with calcium and vitamin D as ongoing. No other changes recommended

## 2013-07-30 NOTE — Telephone Encounter (Signed)
Notified pt with md response.../lmb 

## 2013-08-03 ENCOUNTER — Ambulatory Visit: Payer: BC Managed Care – PPO | Admitting: Rehabilitation

## 2013-08-06 ENCOUNTER — Encounter: Payer: BC Managed Care – PPO | Admitting: Physical Therapy

## 2013-08-12 ENCOUNTER — Ambulatory Visit: Payer: BC Managed Care – PPO | Admitting: Family Medicine

## 2013-08-19 ENCOUNTER — Ambulatory Visit: Payer: BC Managed Care – PPO | Admitting: Family Medicine

## 2013-08-26 ENCOUNTER — Other Ambulatory Visit: Payer: Self-pay | Admitting: Internal Medicine

## 2013-08-27 ENCOUNTER — Telehealth: Payer: Self-pay | Admitting: *Deleted

## 2013-08-27 ENCOUNTER — Telehealth: Payer: Self-pay | Admitting: Gastroenterology

## 2013-08-27 MED ORDER — LINACLOTIDE 290 MCG PO CAPS: 290.0000 ug | ORAL_CAPSULE | Freq: Every day | ORAL | Status: DC

## 2013-08-27 NOTE — Telephone Encounter (Signed)
LMOM for patient to call back.

## 2013-08-27 NOTE — Telephone Encounter (Signed)
Ok to refill until office follow-up

## 2013-08-27 NOTE — Telephone Encounter (Signed)
Patient called and made an appointment with you for 10-09-2013. Patient needs refill on Linzess, is it ok to send?

## 2013-10-03 ENCOUNTER — Other Ambulatory Visit: Payer: Self-pay | Admitting: Internal Medicine

## 2013-10-05 ENCOUNTER — Telehealth: Payer: Self-pay | Admitting: Internal Medicine

## 2013-10-05 DIAGNOSIS — IMO0002 Reserved for concepts with insufficient information to code with codable children: Secondary | ICD-10-CM

## 2013-10-05 DIAGNOSIS — M1710 Unilateral primary osteoarthritis, unspecified knee: Secondary | ICD-10-CM

## 2013-10-05 DIAGNOSIS — M5416 Radiculopathy, lumbar region: Secondary | ICD-10-CM

## 2013-10-05 NOTE — Telephone Encounter (Signed)
Patient is requesting a refill for tramadol.  Patient also wants to inform Dr. Asa Lente that she is taking (3) 15mg  of Meloxicam instead of 1 tablet.  She states one tablet is not working.  She is requesting that the dosage be changed or either to be placed on another med that is stronger.

## 2013-10-05 NOTE — Telephone Encounter (Signed)
Max dose meloxicam is 15mg  daily Pt can cause ulcer or kidney problems taking more than 1 tab of meloxicam daily Will refer to pain clinic for evaluation and tx of her pain symptoms - no new rx from me

## 2013-10-06 NOTE — Telephone Encounter (Signed)
Informed patient of issue that could be caused from taking too much meloxicam.  Also informed her that we would be referring her to a pain management clinic and that she should be hearing something soon.  Patient was okay with this.

## 2013-10-07 ENCOUNTER — Encounter: Payer: Self-pay | Admitting: Internal Medicine

## 2013-10-09 ENCOUNTER — Encounter: Payer: Self-pay | Admitting: Internal Medicine

## 2013-10-09 ENCOUNTER — Ambulatory Visit (INDEPENDENT_AMBULATORY_CARE_PROVIDER_SITE_OTHER): Payer: BC Managed Care – PPO | Admitting: Internal Medicine

## 2013-10-09 VITALS — BP 128/82 | HR 80 | Ht 62.0 in | Wt 268.6 lb

## 2013-10-09 DIAGNOSIS — K219 Gastro-esophageal reflux disease without esophagitis: Secondary | ICD-10-CM

## 2013-10-09 DIAGNOSIS — K5900 Constipation, unspecified: Secondary | ICD-10-CM

## 2013-10-09 DIAGNOSIS — E669 Obesity, unspecified: Secondary | ICD-10-CM

## 2013-10-09 MED ORDER — LINACLOTIDE 290 MCG PO CAPS: 290.0000 ug | ORAL_CAPSULE | Freq: Every day | ORAL | Status: DC

## 2013-10-09 MED ORDER — PANTOPRAZOLE SODIUM 40 MG PO TBEC: 40.0000 mg | DELAYED_RELEASE_TABLET | Freq: Every day | ORAL | Status: DC

## 2013-10-09 NOTE — Progress Notes (Signed)
Subjective:    Patient ID: Janet Le, female    DOB: 1955/09/27, 58 y.o.   MRN: [id number]  HPI Janet Le is a 58 year old female with a past medical history of chronic constipation, GERD, obesity, renal cell cancer status post cryoablation, diabetes, and hypertension who is seen in followup. She was previously managed by Dr. Verl Blalock prior to his retirement. She reports today she is feeling well though she continues to be concerned with weight gain. She continues to have chronic constipation but has responded very well to Linzess 290 mcg daily. With this medication she denies diarrhea or abdominal pain. She's had no blood in her stool or melena. Her GERD was previously controlled on prescription PPI though it became too expensive and she has used over-the-counter Prilosec 20 mg daily. This doesn't work as well to control her heartburn. She denies dysphagia or odynophagia. No nausea or vomiting. No early satiety. No fevers or chills. She would like to consider bariatric surgery.  Review of Systems As per history of present illness, otherwise negative  Current Medications, Allergies, Past Medical History, Past Surgical History, Family History and Social History were reviewed in [employer] record.     Objective:   Physical Exam BP 128/82  Pulse 80  Ht 5\' 2"  (1.575 m)  Wt 268 lb 9.6 oz (121.836 kg)  BMI 49.12 kg/m2 Constitutional: Well-developed and well-nourished. No distress. HEENT: Normocephalic and atraumatic. Oropharynx is clear and moist. No oropharyngeal exudate. Conjunctivae are normal.  No scleral icterus. Neck: Neck supple. Trachea midline. Cardiovascular: Normal rate, regular rhythm and intact distal pulses.  Pulmonary/chest: Effort normal and breath sounds normal. No wheezing, rales or rhonchi. Abdominal: Soft, obese, nontender, nondistended. Bowel sounds active throughout.  Extremities: no clubbing, cyanosis, or edema Lymphadenopathy: No  cervical adenopathy noted. Neurological: Alert and oriented to person place and time. Skin: Skin is warm and dry. No rashes noted. Psychiatric: Normal mood and affect. Behavior is normal.  CBC    Component Value Date/Time   WBC 8.3 07/08/2013 1124   WBC 5.7 09/10/2011   RBC 4.08 07/08/2013 1124   HGB 12.7 07/08/2013 1124   HCT 38.1 07/08/2013 1124   PLT 134.0* 07/08/2013 1124   MCV 93.4 07/08/2013 1124   MCH 29.7 02/08/2011 1120   MCHC 33.2 07/08/2013 1124   RDW 14.6 07/08/2013 1124   LYMPHSABS 2.5 07/08/2013 1124   MONOABS 0.6 07/08/2013 1124   EOSABS 0.1 07/08/2013 1124   BASOSABS 0.0 07/08/2013 1124    CMP     Component Value Date/Time   NA 139 07/08/2013 1124   NA 141 09/10/2011   K 4.2 07/08/2013 1124   CL 105 07/08/2013 1124   CO2 29 07/08/2013 1124   GLUCOSE 96 07/08/2013 1124   BUN 26* 07/08/2013 1124   BUN 22* 09/10/2011   CREATININE 0.9 07/08/2013 1124   CREATININE 0.90 06/01/2013 1216   CREATININE 0.9 09/10/2011   CALCIUM 9.8 07/08/2013 1124   PROT 7.6 07/08/2013 1124   ALBUMIN 3.7 07/08/2013 1124   AST 13 07/08/2013 1124   ALT 15 07/08/2013 1124   ALKPHOS 87 07/08/2013 1124   BILITOT 0.6 07/08/2013 1124   GFRNONAA 71 06/01/2013 1216   GFRNONAA >60 02/08/2011 1120   GFRAA 82 06/01/2013 1216   GFRAA >60 02/08/2011 1120   Colonoscopy April 2012 -- normal. Repeat recommended April 2022 EGD December 2013 -- normal     Assessment & Plan:  58 year old female with a past medical history of  chronic constipation, GERD, obesity, renal cell cancer status post cryoablation, diabetes, and hypertension who is seen in followup.   1.  Chronic constipation --  Stable and responding well to Linzess. I will refill her prescription for Linzess 290 mcg daily. No alarm symptoms. Colonoscopy up-to-date. Recommend repeat for screening in April 2022  2.  GERD -- less well-controlled on omeprazole 20 mg daily. Will discontinue omeprazole and try pantoprazole 40 mg daily. We discussed the side effects. Weight  loss would likely be the biggest thing she could do to control her GERD symptomatically without medication. There died of  3.  Obesity -- she is educated in bariatric surgery and options. I will refer her to bariatric surgery to discuss surgical options.  Return in one year, sooner if needed

## 2013-10-09 NOTE — Patient Instructions (Signed)
We have sent the following medications to your pharmacy for you to pick up at your convenience: Discontinue taking Omeprazole.. Take  Pantoprazole 40 mg daily, Linzess 290 mcg  You have been referred to [facility] for bariatric surgery. They will contact you to set up appointment.  Follow up with Dr. Hilarie Fredrickson in 1 year

## 2013-10-14 ENCOUNTER — Telehealth: Payer: Self-pay | Admitting: Gastroenterology

## 2013-10-14 NOTE — Telephone Encounter (Signed)
Message copied by Annabell Sabal on Wed Oct 14, 2013 10:08 AM ------      Message from: Aviva Signs      Created: Fri Oct 09, 2013 11:28 AM       Pt will need to attend bariatric seminar at [facility]..pt can call and set this up at [phone].Marland KitchenMarland KitchenMarland KitchenThanks Thayer Headings      ----- Message -----         From: Annabell Sabal, CMA         Sent: 10/09/2013  10:05 AM           To: Aviva Signs            Please refer pt for Bariatric surgery.            Thank you!       Butch Otterson       ------

## 2013-10-14 NOTE — Telephone Encounter (Signed)
Called pt told her she needs to call [place] @ [phone]  to to set up a time to attend the bariatric seminar. Pt verbalized understanding

## 2013-11-02 LAB — HM DIABETES EYE EXAM

## 2013-11-04 ENCOUNTER — Encounter: Payer: Self-pay | Admitting: Internal Medicine

## 2013-11-05 ENCOUNTER — Other Ambulatory Visit: Payer: BC Managed Care – PPO

## 2013-11-05 ENCOUNTER — Encounter: Payer: Self-pay | Admitting: Internal Medicine

## 2013-11-05 ENCOUNTER — Telehealth: Payer: Self-pay | Admitting: *Deleted

## 2013-11-05 ENCOUNTER — Ambulatory Visit (INDEPENDENT_AMBULATORY_CARE_PROVIDER_SITE_OTHER): Payer: BC Managed Care – PPO | Admitting: Internal Medicine

## 2013-11-05 ENCOUNTER — Other Ambulatory Visit: Payer: Self-pay | Admitting: Internal Medicine

## 2013-11-05 VITALS — BP 120/82 | HR 61 | Temp 98.2°F | Resp 13 | Wt 262.6 lb

## 2013-11-05 DIAGNOSIS — R079 Chest pain, unspecified: Secondary | ICD-10-CM

## 2013-11-05 DIAGNOSIS — Z85528 Personal history of other malignant neoplasm of kidney: Secondary | ICD-10-CM

## 2013-11-05 DIAGNOSIS — R3915 Urgency of urination: Secondary | ICD-10-CM

## 2013-11-05 DIAGNOSIS — R350 Frequency of micturition: Secondary | ICD-10-CM

## 2013-11-05 DIAGNOSIS — H539 Unspecified visual disturbance: Secondary | ICD-10-CM

## 2013-11-05 DIAGNOSIS — R0789 Other chest pain: Secondary | ICD-10-CM

## 2013-11-05 DIAGNOSIS — R7989 Other specified abnormal findings of blood chemistry: Secondary | ICD-10-CM

## 2013-11-05 LAB — D-DIMER, QUANTITATIVE (NOT AT ARMC): D-Dimer, Quant: 2.54 ug/mL-FEU — ABNORMAL HIGH (ref 0.00–0.48)

## 2013-11-05 LAB — POCT URINALYSIS DIPSTICK
Bilirubin, UA: NEGATIVE
Blood, UA: NEGATIVE
Glucose, UA: NEGATIVE
Ketones, UA: NEGATIVE
Leukocytes, UA: NEGATIVE
Nitrite, UA: NEGATIVE
Protein, UA: NEGATIVE
Spec Grav, UA: 1.02
Urobilinogen, UA: 0.2
pH, UA: 5

## 2013-11-05 NOTE — Patient Instructions (Addendum)
Your next office appointment will be determined based upon review of your pending labs & carotid artery studies . Those instructions will be transmitted to you through My Chart . Followup as needed for your acute issue. Please report any significant change in your symptoms.

## 2013-11-05 NOTE — Telephone Encounter (Signed)
D-DIMER IS HIGH @ 2.54 Also verbal told Dr. Linna Darner...../LMB

## 2013-11-05 NOTE — Progress Notes (Signed)
Subjective:    Patient ID: Janet Le, female    DOB: [birth year], 58 y.o.   MRN: [id number]  HPI  Since 11/02/13 she's had urgency and frequency. She has been seen by [facility]. The most recent office visit & PMH were reviewed.  She also has chronic low back pain which is unrelated.  After this issue was explored she mentioned that she has seen her ophthalmologist, Dr. Eppie Gibson one month ago for "seeing lights" in both eyes with her eyes closed. She stated that he was concerned about some "vascular process" .There is no note from him in the EMR. (Note : Janet Le found FAX on Dr Christy Gentles desk which I reviewed)  Her third concern was left upper sternal chest discomfort this morning. She also chest pain at rest while sitting 3 days ago. This was sharp and lasted seconds and had not recurred until today.( Note: Chief complaint was urgency/frequency &  concerns were brought up  in this order).    Review of Systems  She states that she has had a six # weight loss since her last visit.  She denies dysuria, pyuria, or hematuria.  She has no fever, chills, sweats.  She has had elevated glucoses but her A1c was nondiabetic at 5.9%. Lipids were also normal in January of this year.     Objective:   Physical Exam  Gen.: Adequately nourished in appearance; obese. Alert and cooperative throughout exam.  Head: Normocephalic without obvious abnormalities  Eyes: No corneal or conjunctival inflammation noted. Pupils equal round reactive to light and accommodation. Extraocular motion intact. Prominent globes without lid lag. Ears: External  ear exam reveals no significant lesions or deformities. Canals clear .TMs normal. Hearing is grossly normal bilaterally. Nose: External nasal exam reveals no deformity or inflammation. Nasal mucosa are pink and moist. No lesions or exudates noted.   Mouth: Oral mucosa and oropharynx reveal no lesions or exudates. Teeth in good repair. Neck: No  deformities, masses, or tenderness noted.  Thyroid normal Lungs: Normal respiratory effort; chest expands symmetrically. Lungs are clear to auscultation without rales, wheezes, or increased work of breathing. Heart: Normal rate and rhythm. Normal S1 and S2. No gallop, click, or rub. No murmur. Abdomen: Bowel sounds normal; abdomen soft and nontender. No masses, organomegaly or hernias noted.                                Musculoskeletal/extremities: No deformity or scoliosis noted of  the thoracic or lumbar spine. . No clubbing, cyanosis, or significant extremity  deformity noted. 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 Range of motion normal .Tone & strength normal. Hand joints normal.  Fingernail health good. Homan's negative Able to lie down & sit up w/o help. Negative SLR bilaterally Vascular: Carotid, radial artery, dorsalis pedis and  posterior tibial pulses are full and equal. No bruits present. Homans sign negative bilaterally Neurologic: Alert and oriented x3. Deep tendon reflexes are decreased at the knees.  Gait normal   Skin: Intact without suspicious lesions or rashes. Lymph: No cervical, axillary lymphadenopathy present. Psych: "La belle indifference " suggested in reference to chest pain( "by the way")         Assessment & Plan:  #1 urgency and frequency with normal urinalysis. Followup with [facility] recommended  #2 visual disturbance; rule out vascular etiology  #3 atypical chest pain at rest #4 PMH renal cell cancer  See orders and  recommendations

## 2013-11-05 NOTE — Telephone Encounter (Signed)
Called pt to inform her of md order CT due to elevated D-dimer. No answer LMOM RTC ASAP...Janet Le

## 2013-11-05 NOTE — Progress Notes (Signed)
Pre visit review using our clinic review tool, if applicable. No additional management support is needed unless otherwise documented below in the visit note. 

## 2013-11-06 ENCOUNTER — Encounter: Payer: Self-pay | Admitting: Physical Medicine & Rehabilitation

## 2013-11-06 DIAGNOSIS — E669 Obesity, unspecified: Secondary | ICD-10-CM | POA: Insufficient documentation

## 2013-11-06 HISTORY — DX: Morbid (severe) obesity due to excess calories: E66.01

## 2013-11-06 LAB — TROPONIN I: Troponin I: <0.01 ng/mL (ref <0.06)

## 2013-11-06 NOTE — Telephone Encounter (Signed)
Notified pt with md response on the d-dimer. Inform her [facility] should be contacting her soon concerning the appt for CT...Johny Chess

## 2013-11-06 NOTE — Progress Notes (Signed)
Notified pt with results on the d-dimer. Inform her [facility] should be contacting her soon with CT appt...Johny Chess

## 2013-11-12 ENCOUNTER — Other Ambulatory Visit (INDEPENDENT_AMBULATORY_CARE_PROVIDER_SITE_OTHER): Payer: BC Managed Care – PPO

## 2013-11-12 ENCOUNTER — Other Ambulatory Visit: Payer: Self-pay | Admitting: Internal Medicine

## 2013-11-12 DIAGNOSIS — Z01818 Encounter for other preprocedural examination: Secondary | ICD-10-CM

## 2013-11-12 DIAGNOSIS — R7989 Other specified abnormal findings of blood chemistry: Secondary | ICD-10-CM

## 2013-11-12 DIAGNOSIS — R079 Chest pain, unspecified: Secondary | ICD-10-CM

## 2013-11-12 DIAGNOSIS — R791 Abnormal coagulation profile: Secondary | ICD-10-CM

## 2013-11-12 DIAGNOSIS — H539 Unspecified visual disturbance: Secondary | ICD-10-CM

## 2013-11-12 LAB — CREATININE, SERUM: Creatinine, Ser: 1.6 mg/dL — ABNORMAL HIGH (ref 0.4–1.2)

## 2013-11-12 LAB — BUN: BUN: 23 mg/dL (ref 6–23)

## 2013-11-13 ENCOUNTER — Ambulatory Visit (INDEPENDENT_AMBULATORY_CARE_PROVIDER_SITE_OTHER)
Admission: RE | Admit: 2013-11-13 | Discharge: 2013-11-13 | Disposition: A | Discharge status: Home / Self Care | Payer: BC Managed Care – PPO | Source: Ambulatory Visit | Attending: Internal Medicine | Admitting: Internal Medicine

## 2013-11-13 DIAGNOSIS — R791 Abnormal coagulation profile: Secondary | ICD-10-CM

## 2013-11-13 DIAGNOSIS — R7989 Other specified abnormal findings of blood chemistry: Secondary | ICD-10-CM

## 2013-11-13 DIAGNOSIS — R0789 Other chest pain: Secondary | ICD-10-CM

## 2013-11-13 MED ORDER — IOHEXOL 350 MG/ML SOLN
70.0000 mL | Freq: Once | INTRAVENOUS | Status: AC | PRN
  Administered 2013-11-13: 70 mL via INTRAVENOUS

## 2013-11-17 ENCOUNTER — Ambulatory Visit (HOSPITAL_COMMUNITY)
Admission: RE | Admit: 2013-11-17 | Discharge: 2013-11-17 | Disposition: A | Discharge status: Home / Self Care | Payer: BC Managed Care – PPO | Source: Ambulatory Visit | Attending: Cardiovascular Disease | Admitting: Cardiovascular Disease

## 2013-11-17 DIAGNOSIS — H539 Unspecified visual disturbance: Secondary | ICD-10-CM

## 2013-11-17 NOTE — Progress Notes (Signed)
Carotid Duplex Completed. °Brianna L Mazza,RVT °

## 2013-11-23 ENCOUNTER — Telehealth: Payer: Self-pay | Admitting: *Deleted

## 2013-11-23 MED ORDER — GLUCOSE BLOOD VI STRP: ORAL_STRIP | Status: DC

## 2013-11-23 MED ORDER — ONETOUCH DELICA LANCETS 33G MISC: 1.0000 | Freq: Every day | Status: DC

## 2013-11-23 NOTE — Telephone Encounter (Signed)
Called pt concerning her carotid doppler. Pt also stated she need another glucose monitor machine. Inform pt will leave one touch verio q for pick up & will send test strips with lancet to pharmacy...Johny Chess

## 2013-11-24 ENCOUNTER — Ambulatory Visit (INDEPENDENT_AMBULATORY_CARE_PROVIDER_SITE_OTHER): Payer: BC Managed Care – PPO | Admitting: Internal Medicine

## 2013-11-24 ENCOUNTER — Other Ambulatory Visit (INDEPENDENT_AMBULATORY_CARE_PROVIDER_SITE_OTHER): Payer: BC Managed Care – PPO

## 2013-11-24 ENCOUNTER — Encounter: Payer: Self-pay | Admitting: Internal Medicine

## 2013-11-24 VITALS — BP 94/60 | HR 59 | Temp 98.4°F | Ht 62.0 in | Wt 260.4 lb

## 2013-11-24 DIAGNOSIS — G609 Hereditary and idiopathic neuropathy, unspecified: Secondary | ICD-10-CM

## 2013-11-24 DIAGNOSIS — H539 Unspecified visual disturbance: Secondary | ICD-10-CM

## 2013-11-24 DIAGNOSIS — G629 Polyneuropathy, unspecified: Secondary | ICD-10-CM

## 2013-11-24 LAB — VITAMIN B12: Vitamin B-12: 389 pg/mL (ref 211–911)

## 2013-11-24 LAB — SEDIMENTATION RATE: Sed Rate: 35 mm/hr — ABNORMAL HIGH (ref 0–22)

## 2013-11-24 NOTE — Progress Notes (Signed)
Pre visit review using our clinic review tool, if applicable. No additional management support is needed unless otherwise documented below in the visit note. 

## 2013-11-24 NOTE — Progress Notes (Signed)
   Subjective:    Patient ID: Janet Le, female    DOB: March 06, 1956, 58 y.o.   MRN: [id number]  HPI   She was seen for visual dysfunction by her ophthalmologist; he was concerned about possible cardiovascular etiology. At that time she also complained of some chest symptoms. D-dimer was elevated prompting a CT angiogram of the chest. That study was negative for any embolic phenomena  Carotid Doppler was also negative.  She continues to have visual changes.      Review of Systems  She is also describing tingling in her feet.  Chronic dry skin is also an issue.     Objective:   Physical Exam  She is in no acute distress Pupils equal round reactive to light.  She has no carotid bruits . Chest is clear to auscultation  There is an S4 with slight slurring  Deep tendon reflexes, strength, and tone are normal in the upper extremities  Knees reveal fusiform changes. Deep tendon reflexes are 0-1/2+ at the knees  She has bilateral lipedema changes of the ankle  Light touch normal over the feet        Assessment & Plan:  #1 visual disturbance manifested as "lights" when her eyes are  closed  #2 Atypical chest pain with elevated d-dimer. Negative CT angiogram  Plan: 2D echo  Sedimentation rate  Consider Neurology referral for possible MRA/ MRI  If the studies are negative neurology or neuro-ophthalmology evaluation could be completed.

## 2013-11-24 NOTE — Patient Instructions (Signed)
Your next office appointment will be determined based upon review of your pending labs & Echocardiogram. Those instructions will be transmitted to you by mail. If your symptoms persist or progress; I would recommend referral to Neurology for further assessment .

## 2013-11-24 NOTE — Progress Notes (Signed)
   Subjective:    Patient ID: Janet Le, female    DOB: July 07, 1955, 58 y.o.   MRN: [id number]  HPI Pt was seen by opthomalogist, Dr. Cena Benton, approximately 10/23/13 for visual disturbances. At this time she was experiencing a constat "line of lightness" that she could see out of the bottom of her L eye. A dilated eye exam was performed and the pt states the eye doctor recommended she see her primary care physician for possible cardiac abnormalities. Since she had the eye exam the lightness at the bottom of her L has gone away but she is now having lightness in the lateral field of vision of both eyes. This is intermittent and seems to come and go randomly. It is not associated with change in position or when trying to focus her eyes, or reading etc. She states she has not had a decrease in visual acuity that she's are of.   She had elevated D-dimer and Troponin at last visit, and subsequently ha a CT angiogram and a Carotid doppler U/S 05/15 that were both normal.   2 weeks ago patient noted a feeling of faintness when walking approximately 40 feet at a time. When she walks this far she experiences dizziness and feels like she's going to pass out. She states she has to sit down for a minute or so in order for the dizziness to go away. During these episodes she specficially denies chest pain, palpitations, claudication, SOB, nocturnal dyspnea.   She is also asking about seeing a podiatrist for dry skin and tingling in her feet. She states she has not had    Review of Systems  Constitutional: Positive for fatigue.       Fatigue began with eye symptoms   Eyes: Positive for visual disturbance. Negative for photophobia and pain.       Denies blurry vision or floaters when she experiences the visual disturbances   Gastrointestinal: Negative for nausea.  Neurological: Negative for light-headedness, numbness and headaches.       Objective:   Physical Exam Gen.: Obese and well-nourished in  appearance. Alert, appropriate and cooperative throughout exam. Eyes: No corneal or conjunctival inflammation noted. 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 Constricted pupils bilaterally.  Nose: External nasal exam reveals no deformity or inflammation. Nasal mucosa are pink and moist. No lesions or exudates noted.   Mouth: Oral mucosa and oropharynx reveal no lesions or exudates. Teeth in good repair. Speckled tongue Lungs: Normal respiratory effort; chest expands symmetrically. Lungs are clear to auscultation without rales, wheezes, or increased work of breathing. Heart: Normal rate and rhythm. Normal S1 and S2. No gallop, click, or rub. No murmur                           Musculoskeletal/extremities: Pt has bilateral LE and UE lymphedema.  Vascular: Carotid, radial artery, dorsalis pedis are full and equal. No bruits present. Neurologic: Alert and oriented x3. Deep tendon reflexes symmetrical and normal.  Gait normal  including heel & toe walking . Rhomberg & finger to nose            Assessment & Plan:  #1 visual disturbances; most likely due to diabetic retinopathy  #2 syncope; occurs with exercise most likely cardiac in nature

## 2013-11-25 ENCOUNTER — Telehealth: Payer: Self-pay | Admitting: *Deleted

## 2013-11-25 LAB — RPR

## 2013-11-25 NOTE — Telephone Encounter (Signed)
Spoke with pt advised of MDs message 

## 2013-11-25 NOTE — Telephone Encounter (Signed)
  Anything 65-130 FASTING is ok

## 2013-11-25 NOTE — Telephone Encounter (Signed)
Pt called wanting to know what range her blood sugar should be.  please advise

## 2013-11-30 ENCOUNTER — Encounter: Payer: Self-pay | Admitting: *Deleted

## 2013-11-30 NOTE — Progress Notes (Signed)
Mailed pt lab letter...Janet Le

## 2013-12-07 ENCOUNTER — Ambulatory Visit (INDEPENDENT_AMBULATORY_CARE_PROVIDER_SITE_OTHER): Payer: BC Managed Care – PPO | Admitting: Internal Medicine

## 2013-12-07 ENCOUNTER — Encounter: Payer: Self-pay | Admitting: Internal Medicine

## 2013-12-07 VITALS — BP 100/80 | HR 79 | Temp 98.0°F | Resp 14 | Wt 252.4 lb

## 2013-12-07 DIAGNOSIS — D696 Thrombocytopenia, unspecified: Secondary | ICD-10-CM

## 2013-12-07 DIAGNOSIS — H539 Unspecified visual disturbance: Secondary | ICD-10-CM

## 2013-12-07 DIAGNOSIS — R269 Unspecified abnormalities of gait and mobility: Secondary | ICD-10-CM

## 2013-12-07 DIAGNOSIS — M2550 Pain in unspecified joint: Secondary | ICD-10-CM

## 2013-12-07 DIAGNOSIS — R42 Dizziness and giddiness: Secondary | ICD-10-CM

## 2013-12-07 NOTE — Progress Notes (Signed)
Subjective:    Patient ID: Janet Le, female    DOB: [birth year], 58 y.o.   MRN: [id number]  HPI Pt is here today for visual disturbances, dizziness upon walking, and polyarthalgia. The pt was seen on 11/24/13 at which time  2D echo was ordered;this is still pending. She has had negative carotid dopplers and a negative CT angiogram in 05/15 . CTA done because D dimer ordered to evaluate chest pain was elevated..   Per last visit's note, a neurology or neuro optho c/s is recommended as the next step. The pt's visual disturbances have not changed since her initial evaluation 11/05/13.   Today the pt presents for worsening dizziness upon walking as well as worsening polyarthalgia. The dizziness is occuring during any period of walking or any position change, which is more frequently compared to two weeks ago. The dizziness is also associated with diaphoresis and some weakness in her LEs bilaterally, which is a new symptom. She also newly hearing a "static noise" in her ears bilaterally during these dizzy spells. As was reported last visit the pt specifically denies SOB, chest pain, claudication, nausea or headache.   The polyarthralgia has worsened since 11/24/13. The pt state she has "pain all over; it is in every joint." The pt also reports that her brother was diagnosed with leukemia and that she read online that one symptom of leukemia is joint pain. She is worried she might have leukemia.  Of significance the pt's sedimentation rate was minimally elevated @  35 on 11/24/13. The pt continues to report some numbness in her bilateral LEs as well.   The pt asks if there are any more tests that can be done   Serial labs were reviewed and copy given to her. Her white counts have been totally normal as have the hemoglobin and hematocrit. She's had a mild thrombocytopenia with platelet counts ranging from 128,000-154,000. Differential was within normal.  The flow sheets for vital signs were reviewed as  well. At this time her weight is 252 pounds and 6 ounces. This was essentially her weight 11 months ago.Lowest weight on record was 215 pounds in April 2012.  Review of Systems Constitutional: Negative for fever.  Skin: Negative for rash.  Neurological: Negative for headaches.  Hematological: Negative for adenopathy. Does not bruise/bleed easily.     Objective:   Physical Exam   Significant or distinguishing  findings on physical exam are documented first.  Below that are other systems examined & findings.  She appears well-nourished and in no distress. She is obese per CDC guidelines. Striking lipedema is present at the ankles. Fusiform changes of the knees are noted. She has crepitus on the left knee. No definite effusion is noted. Deep tendon reflexes are decreased at the knees. She ambulates slowly using cane. Pedal pulses are decreased .   Eyes: No conjunctival inflammation or scleral icterus is present.  Oral exam: Dental hygiene is good; lips and gums are healthy appearing.There is no oropharyngeal erythema or exudate noted.  Heart:  Normal rate and regular rhythm. S1 and S2 normal without gallop, murmur, click, rub or other extra sounds . Lungs:Chest clear to auscultation; no wheezes, rhonchi,rales ,or rubs present.No increased work of breathing.  Abdomen: bowel sounds normal, soft and non-tender without masses, organomegaly or hernias noted.  No guarding or rebound .  Musculoskeletal: Able to lie flat and sit up without help. Negative straight leg raising bilaterally.  Skin:Warm & dry.  Intact without suspicious lesions or  rashes ; no jaundice or tenting. Lymphatic: No lymphadenopathy is noted about the head, neck, axilla.            Assessment & Plan:  #1 visual disturbance, unchanged. Echocardiogram is pending  #2 arthralgias in multiple joints  #3 She expresses concern that joint pains indicates leukemia. Serial labs do not suggest ischemic risk  See orders  and recommendations

## 2013-12-07 NOTE — Progress Notes (Signed)
   Subjective:    Patient ID: Janet Le, female    DOB: 19-Jun-1956, 58 y.o.   MRN: [id number]  HPI Pt is here today for visual disturbances, dizziness upon walking, and polyarthalgia. The pt was seen on 11/24/13 at which time she was ordered a 2D echo. She has had negative carotid dopplers and a negative CT angiogram in 05/15. Pt has yet to complete the 2D echo. Per last visit's note, a neurology or neuro optho c/s is recommended as the next step. The pt's visual disturbances have not changed since 11/24/13.   Today the pt presents for worsening dizziness upon walking as well as worsening polyarthalgia. The dizziness is occuring during any period of walking or any position change, which is more frequently compared to two weeks ago. The dizziness is also associated with diaphoresis and some weakness in her LEs bilaterally, which is a new symptom. She also newly hearing a "static noise" in her ears bilaterally during these dizzy spells.  As was reported last visit the pt specifically denies SOB, chest pain, claudication, nausea or headache.   The polyarthralgia has worsened since 11/24/13. The pt state she has "pain all over; it is in every joint." The pt also reports that her brother was diagnosed with leukemia and that she read online that one symptom of leukemia is joint pain. She is worried she might have leukemia. Of significance the pt's sedimentation rate was 35 on 11/24/13. The pt continues to report some numbness in her bilateral LEs as well.    The pt asks if there are any more tests that can be done.   Review of Systems  Constitutional: Negative for fever.  Skin: Negative for rash.  Neurological: Negative for headaches.  Hematological: Negative for adenopathy. Does not bruise/bleed easily.         Objective:   Physical Exam         Assessment & Plan:  #1 dizziness upon exertion;  #2 polyarthalgia

## 2013-12-07 NOTE — Progress Notes (Signed)
Pre visit review using our clinic review tool, if applicable. No additional management support is needed unless otherwise documented below in the visit note. 

## 2013-12-07 NOTE — Patient Instructions (Signed)
A Neurology referral will be scheduled and you'll be notified of the time.

## 2013-12-08 ENCOUNTER — Other Ambulatory Visit (INDEPENDENT_AMBULATORY_CARE_PROVIDER_SITE_OTHER): Payer: BC Managed Care – PPO

## 2013-12-08 DIAGNOSIS — H539 Unspecified visual disturbance: Secondary | ICD-10-CM | POA: Insufficient documentation

## 2013-12-08 DIAGNOSIS — D696 Thrombocytopenia, unspecified: Secondary | ICD-10-CM

## 2013-12-08 DIAGNOSIS — M2550 Pain in unspecified joint: Secondary | ICD-10-CM

## 2013-12-08 LAB — CBC WITH DIFFERENTIAL/PLATELET
Basophils Absolute: 0 10*3/uL (ref 0.0–0.1)
Basophils Relative: 0.3 % (ref 0.0–3.0)
Eosinophils Absolute: 0.2 10*3/uL (ref 0.0–0.7)
Eosinophils Relative: 2 % (ref 0.0–5.0)
HCT: 37.8 % (ref 36.0–46.0)
Hemoglobin: 12.7 g/dL (ref 12.0–15.0)
Lymphocytes Relative: 26.4 % (ref 12.0–46.0)
Lymphs Abs: 2.2 10*3/uL (ref 0.7–4.0)
MCHC: 33.5 g/dL (ref 30.0–36.0)
MCV: 92.7 fl (ref 78.0–100.0)
Monocytes Absolute: 0.6 10*3/uL (ref 0.1–1.0)
Monocytes Relative: 7.5 % (ref 3.0–12.0)
Neutro Abs: 5.3 10*3/uL (ref 1.4–7.7)
Neutrophils Relative %: 63.8 % (ref 43.0–77.0)
Platelets: 153 10*3/uL (ref 150.0–400.0)
RBC: 4.08 Mil/uL (ref 3.87–5.11)
RDW: 14.7 % (ref 11.5–15.5)
WBC: 8.3 10*3/uL (ref 4.0–10.5)

## 2013-12-08 LAB — RHEUMATOID FACTOR: Rhuematoid fact SerPl-aCnc: <10 IU/mL (ref <=14)

## 2013-12-08 LAB — URIC ACID: Uric Acid, Serum: 11.4 mg/dL — ABNORMAL HIGH (ref 2.4–7.0)

## 2013-12-09 LAB — CYCLIC CITRUL PEPTIDE ANTIBODY, IGG: Cyclic Citrullin Peptide Ab: <2 U/mL (ref 0.0–5.0)

## 2013-12-10 ENCOUNTER — Other Ambulatory Visit: Payer: Self-pay | Admitting: Internal Medicine

## 2013-12-11 ENCOUNTER — Telehealth: Payer: Self-pay

## 2013-12-11 ENCOUNTER — Other Ambulatory Visit: Payer: Self-pay | Admitting: Internal Medicine

## 2013-12-11 DIAGNOSIS — H539 Unspecified visual disturbance: Secondary | ICD-10-CM

## 2013-12-11 DIAGNOSIS — R42 Dizziness and giddiness: Secondary | ICD-10-CM

## 2013-12-11 DIAGNOSIS — M109 Gout, unspecified: Secondary | ICD-10-CM | POA: Insufficient documentation

## 2013-12-11 DIAGNOSIS — M1029 Drug-induced gout, multiple sites: Secondary | ICD-10-CM

## 2013-12-11 HISTORY — DX: Gout, unspecified: M10.9

## 2013-12-11 MED ORDER — COLCHICINE 0.6 MG PO TABS: 0.6000 mg | ORAL_TABLET | Freq: Two times a day (BID) | ORAL | Status: DC

## 2013-12-11 NOTE — Telephone Encounter (Signed)
Order placed now (as intended by Hopp per 6/15 OV note)

## 2013-12-11 NOTE — Telephone Encounter (Signed)
Phone call from patient stating she has not heard anything regarding referral to neurology. Please advise.

## 2013-12-14 ENCOUNTER — Telehealth: Payer: Self-pay | Admitting: Internal Medicine

## 2013-12-14 NOTE — Telephone Encounter (Signed)
Patient Information:  Caller Name: Laytoya  Phone: [phone]  Patient: Janet Le, Janet Le  Gender: Female  DOB: August 24, 1955  Age: 58 Years  PCP: Gwendolyn Grant (Adults only)  Office Follow Up:  Does the office need to follow up with this patient?: No  Instructions For The Office: N/A  RN Note:  Pt has an appt for tomorrow but she wants to come in today and asked if she can come in between pt to be seen  Symptoms  Reason For Call & Symptoms: Pt is calling to see if she needs to continue her high blood pressure medicine due to a note in MyChart regarding uric acid placed by Dr Linna Darner; pt is feeling dizzy and lightheaded when she gets up and moves; sx started 1 month ago and has seen Dr Huey Bienenstock regarding these sx; pt stopped her BP meds on 12/13/13 due to a note in [place] regarding uric acid and HTN;  but states that no one specifically instructed her to stop BP;  BP 138/67 this morning; encouraged her not to stop BP meds without MD order; still has not received a call for the neurologist appt  Reviewed Health History In EMR: Yes  Reviewed Medications In EMR: Yes  Reviewed Allergies In EMR: Yes  Reviewed Surgeries / Procedures: Yes  Date of Onset of Symptoms: 11/13/2013  Treatments Tried: multiple lab and cardiac tests that pt reports are negative  Treatments Tried Worked: No  Guideline(s) Used:  Dizziness  Disposition Per Guideline:   See Today in Office  Reason For Disposition Reached:   Patient wants to be seen  Advice Given:  Drink Fluids:  Drink several glasses of fruit juice, other clear fluids, or water. This will improve hydration and blood glucose. If you have a fever or have had heat exposure, make sure the fluids are cold.  Rest for 1-2 Hours:  Lie down with feet elevated for 1 hour. This will improve blood flow and increase blood flow to the brain.  Stand Up Slowly:  In the mornings, sit up for a few minutes before you stand up. That will help your blood flow make  the adjustment.  If you have to stand up for long periods of time, contract and relax your leg muscles to help pump the blood back to the heart.  Call Back If:  Still feel dizzy after 2 hours of rest and fluids  Passes out (faints)  You become worse.  Patient Will Follow Care Advice:  YES  Appointment Scheduled:  12/15/2013 09:00:00 Appointment Scheduled Provider:  Unice Cobble Scheduled per office staff prior to triage  Pt wanted appt for today; offered appt at [facility] location for today but pt declined stating that she would like to be seen at [facility]- will keep appt for tomorrow; instructed to call back if sx worsen or change prior to appt

## 2013-12-15 ENCOUNTER — Encounter: Payer: Self-pay | Admitting: Internal Medicine

## 2013-12-15 ENCOUNTER — Encounter: Payer: Self-pay | Admitting: Neurology

## 2013-12-15 ENCOUNTER — Ambulatory Visit (INDEPENDENT_AMBULATORY_CARE_PROVIDER_SITE_OTHER): Payer: BC Managed Care – PPO | Admitting: Internal Medicine

## 2013-12-15 ENCOUNTER — Ambulatory Visit (INDEPENDENT_AMBULATORY_CARE_PROVIDER_SITE_OTHER): Payer: BC Managed Care – PPO | Admitting: Neurology

## 2013-12-15 VITALS — BP 82/52 | HR 74 | Temp 98.1°F | Resp 16 | Ht 62.0 in | Wt 250.8 lb

## 2013-12-15 VITALS — BP 110/70 | HR 69 | Temp 98.3°F | Resp 14 | Wt 250.4 lb

## 2013-12-15 DIAGNOSIS — M1029 Drug-induced gout, multiple sites: Secondary | ICD-10-CM

## 2013-12-15 DIAGNOSIS — M109 Gout, unspecified: Secondary | ICD-10-CM

## 2013-12-15 DIAGNOSIS — I10 Essential (primary) hypertension: Secondary | ICD-10-CM

## 2013-12-15 DIAGNOSIS — H539 Unspecified visual disturbance: Secondary | ICD-10-CM

## 2013-12-15 DIAGNOSIS — T50904A Poisoning by unspecified drugs, medicaments and biological substances, undetermined, initial encounter: Secondary | ICD-10-CM

## 2013-12-15 DIAGNOSIS — I951 Orthostatic hypotension: Secondary | ICD-10-CM

## 2013-12-15 MED ORDER — LOSARTAN POTASSIUM 100 MG PO TABS: 100.0000 mg | ORAL_TABLET | Freq: Every day | ORAL | Status: DC

## 2013-12-15 MED ORDER — COLCHICINE 0.6 MG PO TABS: ORAL_TABLET | ORAL | Status: DC

## 2013-12-15 NOTE — Progress Notes (Signed)
Pre visit review using our clinic review tool, if applicable. No additional management support is needed unless otherwise documented below in the visit note. 

## 2013-12-15 NOTE — Progress Notes (Signed)
Subjective:    Patient ID: Janet Le, female    DOB: 1955-10-13, 57 y.o.   MRN: [id number]  HPI    She describes lightheadedness and sensation of near syncope beginning in May of this year. When she stops walking  she will have the symptoms.She states with the dizziness she hears " static noises like the radio" which lasts a few seconds.She has no frank hearing loss  She describes weakness in her legs as feeling  "wobbly".  She has no history of frank syncope since childhood.  She has lost 10 pounds since May related to change in lifestyle and diet. She has had sweats without fever or chills.   No associated cardiac or neuro prodrome prior to her symptoms.  Significantly she was found to have elevated uric acid levels at 11.4. Uric acid was drawn because of diffuse arthralgias. It was her concern that she had leukemia because of the joint symptoms. Her CBC and differential did not show any abnormality to suggest such. It was recommended that she stop her thiazide containing anti hypertensive. This has not been done.    Review of Systems   Denied were any change in heart rhythm or rate prior to the event. There was no associated chest pain or shortness of breath .  Specifically denied prior to onset of symptoms were headache or limb weakness, tingling, or numbness. No seizure activity noted.   She describes tingling in the olecranon area of the upper extremities.  She denies symptoms of rhinosinusitis such as nasal purulence.   There is no earache or otic discharge  There is no associated blurred vision, double vision, or loss of vision. She did have the "white spots" in both eyes which was evaluated by the ophthalmologist in May. Neurologic evaluation is pending.         Objective:   Physical Exam    Significant or distinguishing  findings on physical exam are documented first.  Below that are other systems examined & findings.   She appears well-nourished but  obese based on CDC criteria. Minimal nystagmus with vertical gaze. Her supine blood pressure was  102/64; sitting 98/60. Blood pressure could be obtained standing as she described feeling the weakness @ that point . Heart sounds are distant. She has minor  fusiform enlargement of her joints diffusely. Asymmetric edema ; LLE > RLE. Decreased pedal pulses.  Head: Normocephalic without obvious abnormalities Eyes: No corneal or conjunctival inflammation noted. Pupils equal round reactive to light and accommodation. Extraocular motion intact.  Ears: External  ear exam reveals no significant lesions or deformities.  Hearing is grossly normal bilaterally. Nose: External nasal exam reveals no deformity or inflammation. Nasal mucosa are pink and moist. No lesions or exudates noted.   Mouth: Oral mucosa and oropharynx reveal no lesions or exudates. Teeth in good repair. Neck: No deformities, masses, or tenderness noted. Range of motion & Thyroid normal. Lungs: Normal respiratory effort; chest expands symmetrically. Lungs are clear to auscultation without rales, wheezes, or increased work of breathing. Heart: Normal rate and rhythm. Normal S1 and S2. No gallop, click, or rub. No murmur.                       Musculoskeletal/extremities: No deformity or scoliosis noted of  the thoracic or lumbar spine.  No clubbing, cyanosis, edema, or significant extremity  deformity noted. Tone & strength normal. Fingernail health good. Vascular: Carotid, radial artery, dorsalis pedis and  posterior tibial  pulses are equal. No bruits present. Neurologic: Alert and oriented x3. Deep tendon reflexes symmetrical but 1/2+ @ knees.  Gait normal.       Skin: Intact without suspicious lesions or rashes. Lymph: No cervical, axillary lymphadenopathy present. Psych: Mood and affect are normal. Normally interactive                                                                                      Assessment & Plan:  #1  gout, ? Due to HCTZ #2 postural hypotension See orders & recommendations

## 2013-12-15 NOTE — Progress Notes (Signed)
   Subjective:    Patient ID: Janet Le, female    DOB: 27-Jul-1955, 58 y.o.   MRN: [id number]  HPI    Review of Systems     Objective:   Physical Exam        Assessment & Plan:

## 2013-12-15 NOTE — Patient Instructions (Addendum)
1.  We will schedule for MRI of the brain.  I will let you know the results.  Follow up in 4 weeks for re-evaluation. Nittany 12/19/13 9:30 am

## 2013-12-15 NOTE — Patient Instructions (Addendum)
Minimal Blood Pressure Goal= AVERAGE < 140/90;  Ideal is an AVERAGE < 135/85. This AVERAGE should be calculated from @ least 5-7 BP readings taken @ different times of day on different days of week. You should not respond to isolated BP readings , but rather the AVERAGE for that week .Please bring your  blood pressure cuff to office visits to verify that it is reliable.It  can also be checked against the blood pressure device at the pharmacy. Finger or wrist cuffs are not dependable; an arm cuff is. Perform isometric exercise of calves  ( while seated go up on toes to count of 5 & then onto heels for 5 count). Repeat  4- 5 times prior to standing if you've been seated or supine for any significant period of time as BP drops with such positions.  BP medication will be changed to Losartan to help prevent gout. To prevent gout the minimal uric acid goal is < 7; preferred is < 6, ideal or protective value is < 5 .  The most common cause of elevated uric acid is the ingestion of sugar from high fructose corn syrup sources in processed foods & drinks. You should consume less than 30 grams (ideally ZERO) of sugar per day from foods and drinks with high fructose corn syrup as number 1,2, 3, or #4 on the label. Chronic elevation of uric acid is not an isolated arthritic issue;it also increases cardiac & kidney disease risk.

## 2013-12-15 NOTE — Progress Notes (Signed)
NEUROLOGY CONSULTATION NOTE  Janet Le MRN: [id number] DOB: 1956/02/15  Referring provider: Dr. Asa Lente Primary care provider: Dr. Asa Lente  Reason for consult:  Visual disturbance and dizziness.  HISTORY OF PRESENT ILLNESS: Janet Le is a 58 year old right-handed woman with history of renal cell carcinoma, hypertension, pre-diabetes, migraine, IBS, lymphedema, osteoarthritis and osteoperosis who presents for dizziness and visual disturbance.  Records and images personally reviewed.  Symptoms started about a year ago, but noticed it more this past year.  She describes initially seeing a line of light along the bottom of her visual field in the left eye.  She then developed what looks like bubbles floating up in the temporal aspect of the visual fields of in both eyes.  It lasts only a few seconds and occurs several times a day.  There is no vision loss.  There is no associated headache.  There was a concern for diabetic retinopathy.  She saw Dr. Katy Fitch, an ophthalmologist on 11/02/13.  The exam did not reveal diabetic retinopathy, but a cardiac evaluation was recommended..  A couple of weeks later, she began feeling dizzy.  She describes it as a feeling of lightheadedness, as if she is going to pass out.  There is no spinning sensation.  She sometimes hears "static" during these spells.  She has been found to have low blood pressure and is orthostatic.  She also began feeling diffuse pain in her joints.  Uric acid level was 11.4.  HCTZ was discontinued today and she was placed on colchicine.    She had an extensive workup, which included: 07/08/13 LABS:  Hgb A1c 5.9, TSH 3.03 11/05/13 LABS:  d-Dimer 2.54, troponin negative 5/22/5 CTA Chest (due to elevated d-dimer): negative for PE 11/17/13 Carotid duplex:  normal patency 11/24/13 LABS:  RPR nonreactive, Sed rate 35, B12 389 12/08/13 LABS:  RF negative, uric acid 62.9, cyclic citrul peptide antibodies negative, WBC 8.3  PAST MEDICAL  HISTORY: Past Medical History  Diagnosis Date  . GERD (gastroesophageal reflux disease)   . Hypertension   . Chronic constipation   . Hiatal hernia   . Endometriosis   . Syncopal episodes     since childhood  . Chronic sinusitis   . Osteoarthritis   . Hyperlipidemia   . Allergic rhinitis, cause unspecified   . Renal cell cancer 11/2010 dx    R, s/p cryoablation 02/16/11  . Irritable bowel syndrome   . Hemorrhoids   . Esophageal stricture   . Hiatal hernia   . Diabetes mellitus     diet controlled    PAST SURGICAL HISTORY: Past Surgical History  Procedure Laterality Date  . Breast surgery      bilateral  . Tubal ligation    . Laser ablation of the cervix    . Functional endoscopic sinus surgery    . Fallopian tube removed    . Renal cryoablation  02/16/11    R kidney due to [place] (IR procedure)    MEDICATIONS: Current Outpatient Prescriptions on File Prior to Visit  Medication Sig Dispense Refill  . ACCU-CHEK SOFTCLIX LANCETS lancets by Other route. Use twice a day for blood sugar       . alendronate (FOSAMAX) 70 MG tablet TAKE 1 TABLET BY MOUTH EVERY 7 DAYS. TAKE WITH A FULL GLASS OF WATER AND ON AN EMPTY STOMACH.  12 tablet  3  . Calcium Carbonate-Vitamin D 600-100 MG-UNIT CAPS Take 1 tablet by mouth daily.       [name]  colchicine 0.6 MG tablet 2 immediately then 1 pill hour later day 1. Take 1 qd thereafter.  30 tablet  0  . fluticasone (FLONASE) 50 MCG/ACT nasal spray Place 1 spray into the nose daily.  16 g  2  . furosemide (LASIX) 20 MG tablet Take 2 tablets (40 mg total) by mouth every other day.  180 tablet  3  . glucose blood (ONETOUCH VERIO) test strip Use  To check blood sugars once a day Dx 250.00  100 each  3  . lidocaine (XYLOCAINE) 5 % ointment APPLY TOPICALLY AS NEEDED.  35.44 g  1  . Linaclotide (LINZESS) 290 MCG CAPS capsule Take 1 capsule (290 mcg total) by mouth daily.  30 capsule  11  . Lorcaserin HCl (BELVIQ) 10 MG TABS Take 10 mg by mouth 2 (two) times  daily.  60 tablet  2  . losartan (COZAAR) 100 MG tablet Take 1 tablet (100 mg total) by mouth daily.  30 tablet  5  . meloxicam (MOBIC) 15 MG tablet TAKE ONE TABLET BY MOUTH ONCE DAILY AS NEEDED  90 tablet  1  . Multiple Vitamins-Minerals (HAIR/SKIN/NAILS PO) Take 1 tablet by mouth 3 (three) times daily.      Glory Rosebush DELICA LANCETS 78I MISC 1 each by Does not apply route daily. Use to help check blood sugars once daily Dx 250.00  100 each  3  . pantoprazole (PROTONIX) 40 MG tablet Take 1 tablet (40 mg total) by mouth daily.  90 tablet  11  . pravastatin (PRAVACHOL) 20 MG tablet TAKE ONE TABLET BY MOUTH ONCE DAILY  90 tablet  3  . propranolol (INDERAL) 80 MG tablet TAKE ONE TABLET BY MOUTH TWICE DAILY  60 tablet  5  . silodosin (RAPAFLO) 8 MG CAPS capsule Take 8 mg by mouth daily with breakfast.      . solifenacin (VESICARE) 5 MG tablet Take 5 mg by mouth daily.      . SUMAtriptan (IMITREX) 50 MG tablet Take 1 tablet (50 mg total) by mouth once as needed for migraine.  12 tablet  1   No current facility-administered medications on file prior to visit.    ALLERGIES: Allergies  Allergen Reactions  . Sertraline Hcl Other (See Comments)    Spasms; numbness  . Ace Inhibitors Cough  . Codeine Palpitations  . Darifenacin Hydrobromide Er Other (See Comments)    Pt states made her feel queezy & drunk  . Gabapentin Other (See Comments)    Hallucinations  . Propoxyphene Hcl Palpitations  . Wellbutrin [Bupropion Hcl] Other (See Comments)    Numbness of mouth/lips    FAMILY HISTORY: Family History  Problem Relation Age of Onset  . Diabetes Mother   . Kidney disease Father   . Prostate cancer Father   . Colitis Brother   . Leukemia Brother   . Multiple sclerosis Sister   . Colon polyps    . Colon cancer Neg Hx     SOCIAL HISTORY: History   Social History  . Marital Status: Married    Spouse Name: N/A    Number of Children: N/A  . Years of Education: N/A   Occupational  History  . 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 Smoking status: Never Smoker   . Smokeless tobacco: Never Used  . Alcohol Use: No     Comment: rare  . Drug Use: No  . Sexual  Activity: No   Other Topics Concern  . Not on file   Social History Narrative  . No narrative on file    REVIEW OF SYSTEMS: Constitutional: No fevers, chills, or sweats, no generalized fatigue, change in appetite Eyes: No visual changes, double vision, eye pain Ear, nose and throat: No hearing loss, ear pain, nasal congestion, sore throat Cardiovascular: No chest pain, palpitations Respiratory:  No shortness of breath at rest or with exertion, wheezes GastrointestinaI: No nausea, vomiting, diarrhea, abdominal pain, fecal incontinence Genitourinary:  No dysuria, urinary retention or frequency Musculoskeletal:  No neck pain, back pain Integumentary: No rash, pruritus, skin lesions Neurological: as above Psychiatric: No depression, insomnia, anxiety Endocrine: No palpitations, fatigue, diaphoresis, mood swings, change in appetite, change in weight, increased thirst Hematologic/Lymphatic:  No anemia, purpura, petechiae. Allergic/Immunologic: no itchy/runny eyes, nasal congestion, recent allergic reactions, rashes  PHYSICAL EXAM: Filed Vitals:   12/15/13 1245  BP: 82/52  Pulse: 74  Temp: 98.1 F (36.7 C)  Resp: 16   General: No acute distress Head:  Normocephalic/atraumatic Neck: supple, no paraspinal tenderness, full range of motion Back: No paraspinal tenderness Heart: regular rate and rhythm Lungs: Clear to auscultation bilaterally. Vascular: No carotid bruits. Neurological Exam: Mental status: alert and oriented to person, place, and time, recent and remote memory intact, fund of knowledge intact, attention and concentration intact, speech fluent and not dysarthric, language intact. Cranial nerves: CN I: not tested CN II: pupils equal,  round and reactive to light, visual fields intact, fundi unremarkable, without vessel changes, exudates, hemorrhages or papilledema. CN III, IV, VI:  full range of motion, no nystagmus, no ptosis CN V: facial sensation intact CN VII: upper and lower face symmetric CN VIII: hearing intact CN IX, X: gag intact, uvula midline CN XI: sternocleidomastoid and trapezius muscles intact CN XII: tongue midline Bulk & Tone: normal, no fasciculations. Motor: 5/5 throughout Sensation: pinprick and vibration intact Deep Tendon Reflexes: absent, toes down Finger to nose testing: no dysmetria Heel to shin: no dysmetria Gait: wide-based, able to walk in tandem. Romberg negative.  IMPRESSION: 1.  Visual disturbance.  Positive symptoms rather than vision loss.  Does not appear to be migraine as it occurs several times a day for several months, and with no associated migraine headache.  If there is nothing eye-related, then would look for an intracranial etiology involving the occipital lobes or related tracks. 2.  Postural hypotension  PLAN: 1.  MRI brain (with contrast if possible) 2.  Management of blood pressure 3.  Follow up in 4 weeks.  45 minutes spent with patient, over 50% spent counseling and coordinating care.  Thank you for allowing me to take part in the care of this patient.  Metta Clines, DO  CC:  Gwendolyn Grant, MD

## 2013-12-16 ENCOUNTER — Other Ambulatory Visit (HOSPITAL_COMMUNITY): Payer: BC Managed Care – PPO

## 2013-12-18 ENCOUNTER — Telehealth: Payer: Self-pay | Admitting: Internal Medicine

## 2013-12-18 ENCOUNTER — Ambulatory Visit (HOSPITAL_COMMUNITY)
Admission: RE | Admit: 2013-12-18 | Discharge: 2013-12-18 | Disposition: A | Discharge status: Home / Self Care | Payer: BC Managed Care – PPO | Source: Ambulatory Visit | Attending: Physical Medicine & Rehabilitation | Admitting: Physical Medicine & Rehabilitation

## 2013-12-18 ENCOUNTER — Encounter: Payer: BC Managed Care – PPO | Attending: Physical Medicine & Rehabilitation

## 2013-12-18 ENCOUNTER — Encounter: Payer: Self-pay | Admitting: Physical Medicine & Rehabilitation

## 2013-12-18 ENCOUNTER — Ambulatory Visit (HOSPITAL_BASED_OUTPATIENT_CLINIC_OR_DEPARTMENT_OTHER): Payer: BC Managed Care – PPO | Admitting: Physical Medicine & Rehabilitation

## 2013-12-18 VITALS — BP 114/65 | HR 68 | Resp 16 | Ht 62.0 in | Wt 251.0 lb

## 2013-12-18 DIAGNOSIS — M25559 Pain in unspecified hip: Secondary | ICD-10-CM | POA: Insufficient documentation

## 2013-12-18 DIAGNOSIS — Z85528 Personal history of other malignant neoplasm of kidney: Secondary | ICD-10-CM | POA: Insufficient documentation

## 2013-12-18 DIAGNOSIS — G8929 Other chronic pain: Secondary | ICD-10-CM | POA: Insufficient documentation

## 2013-12-18 DIAGNOSIS — M810 Age-related osteoporosis without current pathological fracture: Secondary | ICD-10-CM | POA: Insufficient documentation

## 2013-12-18 DIAGNOSIS — M79609 Pain in unspecified limb: Secondary | ICD-10-CM | POA: Insufficient documentation

## 2013-12-18 DIAGNOSIS — I10 Essential (primary) hypertension: Secondary | ICD-10-CM | POA: Insufficient documentation

## 2013-12-18 DIAGNOSIS — E785 Hyperlipidemia, unspecified: Secondary | ICD-10-CM | POA: Insufficient documentation

## 2013-12-18 DIAGNOSIS — R52 Pain, unspecified: Secondary | ICD-10-CM

## 2013-12-18 DIAGNOSIS — M545 Low back pain, unspecified: Secondary | ICD-10-CM

## 2013-12-18 DIAGNOSIS — E119 Type 2 diabetes mellitus without complications: Secondary | ICD-10-CM | POA: Insufficient documentation

## 2013-12-18 DIAGNOSIS — M25569 Pain in unspecified knee: Secondary | ICD-10-CM | POA: Insufficient documentation

## 2013-12-18 DIAGNOSIS — Z79899 Other long term (current) drug therapy: Secondary | ICD-10-CM

## 2013-12-18 DIAGNOSIS — M5137 Other intervertebral disc degeneration, lumbosacral region: Secondary | ICD-10-CM | POA: Insufficient documentation

## 2013-12-18 DIAGNOSIS — M47812 Spondylosis without myelopathy or radiculopathy, cervical region: Secondary | ICD-10-CM | POA: Insufficient documentation

## 2013-12-18 DIAGNOSIS — M25519 Pain in unspecified shoulder: Secondary | ICD-10-CM | POA: Insufficient documentation

## 2013-12-18 DIAGNOSIS — Q762 Congenital spondylolisthesis: Secondary | ICD-10-CM | POA: Insufficient documentation

## 2013-12-18 DIAGNOSIS — M542 Cervicalgia: Secondary | ICD-10-CM

## 2013-12-18 DIAGNOSIS — K219 Gastro-esophageal reflux disease without esophagitis: Secondary | ICD-10-CM | POA: Insufficient documentation

## 2013-12-18 DIAGNOSIS — Z5181 Encounter for therapeutic drug level monitoring: Secondary | ICD-10-CM

## 2013-12-18 DIAGNOSIS — E669 Obesity, unspecified: Secondary | ICD-10-CM | POA: Insufficient documentation

## 2013-12-18 DIAGNOSIS — M51379 Other intervertebral disc degeneration, lumbosacral region without mention of lumbar back pain or lower extremity pain: Secondary | ICD-10-CM | POA: Insufficient documentation

## 2013-12-18 MED ORDER — TRAMADOL-ACETAMINOPHEN 37.5-325 MG PO TABS: 1.0000 | ORAL_TABLET | Freq: Three times a day (TID) | ORAL | Status: DC | PRN

## 2013-12-18 MED ORDER — DICLOFENAC SODIUM 1 % TD GEL: 2.0000 g | Freq: Four times a day (QID) | TRANSDERMAL | Status: DC

## 2013-12-18 NOTE — Progress Notes (Signed)
Subjective:    Patient ID: ARDEL JAGGER, female    DOB: Aug 12, 1955, 58 y.o.   MRN: [id number]  HPI Cc: Shoulder pain back pain leg pain as well as right hip pain, knee pain  Hypersensitivity of the legs. Patient with borderline diabetes but recent blood sugars, normal Also undergoing workup for dizziness as well as visual disturbance. Has been evaluated by neurology as well as ophthalmology. Ophthalmologist does not feel like there is any primary retinal problems. MRI of the brain and schedule. Past medical history significant for renal cell carcinoma. Also history of osteoporosis, obesity, gallop, right knee osteoarthritis.  Works full time from home. Ambulates with a cane over the last couple months. Housework and cooking take longer than it used to.  Patient started some physical therapy in February but did not complete this secondary to birth of a grandchild.  Was instructed to exercise for knees and back. Has not kept up these exercises. Patient would like to start aquatic exercise, water aerobic  Pain Inventory Average Pain 7 Pain Right Now 2 My pain is intermittent and sharp  In the last 24 hours, has pain interfered with the following? General activity 10 Relation with others 0 Enjoyment of life 6 What TIME of day is your pain at its worst? evening Sleep (in general) Fair  Pain is worse with: walking, standing and some activites Pain improves with: rest Relief from Meds: 1  Mobility use a cane how many minutes can you walk? 3 ability to climb steps?  yes do you drive?  yes Do you have any goals in this area?  yes  Function employed # of hrs/week [facility] Do you have any goals in this area?  yes  Neuro/Psych bowel control problems weakness numbness dizziness  Prior Studies bone scan x-rays CT/MRI CLINICAL DATA:  Bilateral knee pain and swelling   EXAM: BILATERAL KNEES STANDING - 1 VIEW   COMPARISON:  None.   FINDINGS: Single  frontal view bilateral knee submitted. No acute fracture or subluxation. Bilateral narrowing of medial joint compartment.   IMPRESSION: Bilateral knee narrowing of medial joint compartment. No acute fracture or subluxation.  Physicians involved in your care Primary care . Neurologist .   Family History  Problem Relation Age of Onset  . Diabetes Mother   . Kidney disease Father   . Prostate cancer Father   . Colitis Brother   . Leukemia Brother   . Multiple sclerosis Sister   . Colon polyps    . Colon cancer Neg Hx    History   Social History  . Marital Status: Married    Spouse Name: N/A    Number of Children: N/A  . Years of Education: N/A   Occupational History  . 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 Smoking status: Never Smoker   . Smokeless tobacco: Never Used  . Alcohol Use: No     Comment: rare  . Drug Use: No  . Sexual Activity: No   Other Topics Concern  . None   Social History Narrative  . None   Past Surgical History  Procedure Laterality Date  . Breast surgery      bilateral  . Tubal ligation    . Laser ablation of the cervix    . Functional endoscopic sinus surgery    . Fallopian tube removed    . Renal cryoablation  02/16/11  R kidney due to [place] (IR procedure)   Past Medical History  Diagnosis Date  . GERD (gastroesophageal reflux disease)   . Hypertension   . Chronic constipation   . Hiatal hernia   . Endometriosis   . Syncopal episodes     since childhood  . Chronic sinusitis   . Osteoarthritis   . Hyperlipidemia   . Allergic rhinitis, cause unspecified   . Renal cell cancer 11/2010 dx    R, s/p cryoablation 02/16/11  . Irritable bowel syndrome   . Hemorrhoids   . Esophageal stricture   . Hiatal hernia   . Diabetes mellitus     diet controlled   BP 114/65  Pulse 68  Resp 16  Ht 5\' 2"  (1.575 m)  Wt 251 lb (113.853 kg)  BMI 45.90 kg/m2  SpO2 100%  Opioid Risk  Score: 7 Fall Risk Score: High Fall Risk (>13 points) (patient educated handout given)   Review of Systems  Cardiovascular: Positive for leg swelling.  Gastrointestinal: Positive for constipation.  Musculoskeletal: Positive for arthralgias, back pain, gait problem, myalgias and neck pain.  Neurological: Positive for dizziness, weakness and numbness.  All other systems reviewed and are negative.      Objective:   Physical Exam  Nursing note and vitals reviewed. Constitutional: She is oriented to person, place, and time. She appears well-developed and well-nourished.  HENT:  Head: Normocephalic and atraumatic.  Eyes: Conjunctivae and EOM are normal. Pupils are equal, round, and reactive to light.  Neck: Normal range of motion.  Musculoskeletal:       Right hip: Normal.       Left hip: Normal.       Cervical back: She exhibits no tenderness, no deformity and no spasm.       Lumbar back: She exhibits decreased range of motion. She exhibits no tenderness, no bony tenderness, no deformity and no spasm.  Normal knee and hip range of motion. No evidence of knee effusion. Mild ankle pain to palpation left side. Not localizing.  Normal stroller range of motion.  Neurological: She is alert and oriented to person, place, and time. She has normal strength. She displays no atrophy. She exhibits normal muscle tone. Gait normal.  Reflex Scores:      Tricep reflexes are 1+ on the right side and 1+ on the left side.      Bicep reflexes are 1+ on the right side and 1+ on the left side.      Brachioradialis reflexes are 1+ on the right side and 1+ on the left side.      Patellar reflexes are 1+ on the right side and 1+ on the left side.      Achilles reflexes are 1+ on the right side and 1+ on the left side. Negative straight leg raising test  Psychiatric: She has a normal mood and affect.          Assessment & Plan:  1 chronic multifocal pain. Likely several areas. Does have some imaging  studies available for review.  Knee pain likely medial joint osteoarthritis. May benefit from injections, Voltaren gel, aquatic therapy

## 2013-12-18 NOTE — Telephone Encounter (Signed)
She can add MiraLax 17 g daily to Linzess to 290 mcg daily; in the past several patients have benefited from stopping Linzess altogether for 3-5 days and then restarting. Reportedly the short drug holiday helps with efficacy when restarting

## 2013-12-18 NOTE — Patient Instructions (Signed)
Go to [facility] for neck and back x-rays  Start aquatic exercise as soon as you can  Voltaren gel to knees 4 times per day  Tramadol 3 times per day as needed for pain

## 2013-12-18 NOTE — Telephone Encounter (Signed)
Pt states she takes Linzess 272mcg for constipation but she states it is not working as well as it used to. States she took it this am and had a bm but was constipated, had to strain and has not been having to do this. Pt wants to know if there is something else she can take. Please advise.

## 2013-12-18 NOTE — Telephone Encounter (Signed)
Spoke with pt and she is aware.

## 2013-12-19 ENCOUNTER — Ambulatory Visit
Admission: RE | Admit: 2013-12-19 | Discharge: 2013-12-19 | Disposition: A | Discharge status: Home / Self Care | Payer: BC Managed Care – PPO | Source: Ambulatory Visit | Attending: Neurology | Admitting: Neurology

## 2013-12-19 DIAGNOSIS — H539 Unspecified visual disturbance: Secondary | ICD-10-CM

## 2013-12-21 ENCOUNTER — Telehealth: Payer: Self-pay | Admitting: *Deleted

## 2013-12-21 NOTE — Telephone Encounter (Signed)
Patient is aware of MRI results

## 2013-12-21 NOTE — Telephone Encounter (Signed)
Message copied by Claudie Revering on Mon Dec 21, 2013  8:29 AM ------      Message from: JAFFE, ADAM R      Created: Mon Dec 21, 2013  6:30 AM       MRI is unremarkable.  Does not show anything to account for the visual disturbance.      ----- Message -----         From: Rad Results In Interface         Sent: 12/19/2013  11:32 AM           To: Dudley Major, DO                   ------

## 2013-12-29 ENCOUNTER — Encounter: Payer: Self-pay | Admitting: Internal Medicine

## 2013-12-29 ENCOUNTER — Ambulatory Visit (INDEPENDENT_AMBULATORY_CARE_PROVIDER_SITE_OTHER): Payer: BC Managed Care – PPO | Admitting: Internal Medicine

## 2013-12-29 ENCOUNTER — Other Ambulatory Visit (INDEPENDENT_AMBULATORY_CARE_PROVIDER_SITE_OTHER): Payer: BC Managed Care – PPO

## 2013-12-29 VITALS — BP 140/80 | HR 63 | Temp 98.5°F | Ht 62.0 in | Wt 254.0 lb

## 2013-12-29 DIAGNOSIS — H539 Unspecified visual disturbance: Secondary | ICD-10-CM

## 2013-12-29 DIAGNOSIS — R7309 Other abnormal glucose: Secondary | ICD-10-CM

## 2013-12-29 DIAGNOSIS — M1029 Drug-induced gout, multiple sites: Secondary | ICD-10-CM

## 2013-12-29 DIAGNOSIS — E7800 Pure hypercholesterolemia, unspecified: Secondary | ICD-10-CM

## 2013-12-29 DIAGNOSIS — M109 Gout, unspecified: Secondary | ICD-10-CM

## 2013-12-29 DIAGNOSIS — T50904A Poisoning by unspecified drugs, medicaments and biological substances, undetermined, initial encounter: Secondary | ICD-10-CM

## 2013-12-29 LAB — BASIC METABOLIC PANEL
BUN: 16 mg/dL (ref 6–23)
CO2: 29 mEq/L (ref 19–32)
Calcium: 9.7 mg/dL (ref 8.4–10.5)
Chloride: 103 mEq/L (ref 96–112)
Creatinine, Ser: 1 mg/dL (ref 0.4–1.2)
GFR: 72.29 mL/min (ref >60.00)
Glucose, Bld: 99 mg/dL (ref 70–99)
Potassium: 4 mEq/L (ref 3.5–5.1)
Sodium: 137 mEq/L (ref 135–145)

## 2013-12-29 LAB — HEMOGLOBIN A1C: Hgb A1c MFr Bld: 5.8 % (ref 4.6–6.5)

## 2013-12-29 MED ORDER — COLCHICINE 0.6 MG PO TABS: 0.6000 mg | ORAL_TABLET | Freq: Every day | ORAL | Status: DC

## 2013-12-29 MED ORDER — PRAVASTATIN SODIUM 20 MG PO TABS: ORAL_TABLET | ORAL | Status: DC

## 2013-12-29 NOTE — Progress Notes (Signed)
Pre visit review using our clinic review tool, if applicable. No additional management support is needed unless otherwise documented below in the visit note. 

## 2013-12-29 NOTE — Assessment & Plan Note (Signed)
Diet controlled (take cinnamon herbal tx) Check a1c now Lab Results  Component Value Date   HGBA1C 5.9 07/08/2013

## 2013-12-29 NOTE — Assessment & Plan Note (Signed)
rx'd low dose prava as LDL>100 12/2012 Weight trends reviewed The patient is asked to make an attempt to improve diet and exercise patterns to aid in medical management of this problem. Check annually

## 2013-12-29 NOTE — Progress Notes (Signed)
Subjective:    Patient ID: Janet Le, female    DOB: [birth year], 58 y.o.   MRN: [id number]  HPI  Patient is here for follow up  Reviewed chronic medical issues and interval medical events  Past Medical History  Diagnosis Date  . GERD (gastroesophageal reflux disease)   . Hypertension   . Chronic constipation   . Hiatal hernia   . Endometriosis   . Syncopal episodes     since childhood  . Chronic sinusitis   . Osteoarthritis   . Hyperlipidemia   . Allergic rhinitis, cause unspecified   . Renal cell cancer 11/2010 dx    R, s/p cryoablation 02/16/11  . Irritable bowel syndrome   . Hemorrhoids   . Esophageal stricture   . Hiatal hernia   . Diabetes mellitus     diet controlled    Review of Systems  Constitutional: Positive for fatigue. Negative for unexpected weight change.  Eyes: Positive for visual disturbance.  Respiratory: Negative for cough and shortness of breath.   Cardiovascular: Negative for chest pain.  Neurological: Positive for dizziness.       Objective:   Physical Exam  BP 140/80  Pulse 63  Temp(Src) 98.5 F (36.9 C) (Oral)  Ht 5\' 2"  (1.575 m)  Wt 254 lb (115.214 kg)  BMI 46.45 kg/m2  SpO2 99% Wt Readings from Last 3 Encounters:  12/29/13 254 lb (115.214 kg)  12/18/13 251 lb (113.853 kg)  12/15/13 250 lb 12.8 oz (113.762 kg)   Constitutional: She is MO, but appears well-developed and well-nourished. No distress.  Neck: Normal range of motion. Neck supple. No JVD present. No thyromegaly present.  Cardiovascular: Normal rate, regular rhythm and normal heart sounds.  No murmur heard. No BLE edema. Pulmonary/Chest: Effort normal and breath sounds normal. No respiratory distress. She has no wheezes.  Neuro: awake, alert, oriented x4. Cranial nerves II through XII symmetrically intact. Speech fluent, gait antalgic (baseline use of cane) Psychiatric: She has a normal mood and affect. Her behavior is normal. Judgment and thought content normal.     Lab Results  Component Value Date   WBC 8.3 12/08/2013   HGB 12.7 12/08/2013   HCT 37.8 12/08/2013   PLT 153.0 12/08/2013   GLUCOSE 96 07/08/2013   CHOL 168 07/08/2013   TRIG 84.0 07/08/2013   HDL 52.90 07/08/2013   LDLDIRECT 143.8 07/01/2012   LDLCALC 98 07/08/2013   ALT 15 07/08/2013   AST 13 07/08/2013   NA 139 07/08/2013   K 4.2 07/08/2013   CL 105 07/08/2013   CREATININE 1.6* 11/12/2013   BUN 23 11/12/2013   CO2 29 07/08/2013   TSH 3.03 07/08/2013   INR 0.91 02/08/2011   HGBA1C 5.9 07/08/2013   MICROALBUR 0.8 01/07/2013    Mr Brain Wo Contrast  12/19/2013   CLINICAL DATA:  58 year old female with visual disturbances, progressing x1 year. Initial encounter. History of renal cell carcinoma in 2012 treated with cryoablation. No known metastatic disease.  EXAM: MRI HEAD WITHOUT CONTRAST  TECHNIQUE: Multiplanar, multiecho pulse sequences of the brain and surrounding structures were obtained without intravenous contrast.  COMPARISON:  Paranasal sinus CT 05/26/2012.  FINDINGS: Cerebral volume is normal. No restricted diffusion to suggest acute infarction. No midline shift, mass effect, evidence of mass lesion, ventriculomegaly, extra-axial collection or acute intracranial hemorrhage. Cervicomedullary junction and pituitary are within normal limits. Negative visualized cervical spine. Major intracranial vascular flow voids are within normal limits. Pearline Cables and white matter signal is within normal  limits throughout the brain.  Visualized orbit soft tissues are within normal limits. No optic pathways lesion identified on these noncontrast images.  At the left vertex near the sagittal sinus there is a subtle 11 mm rounded area (series 10, image 21) which may simply reflect dural calcifications which appear to be abundant elsewhere along the convexities. No adjacent cerebral edema.  Visible internal auditory structures appear normal. Mastoids are clear. Minor paranasal sinus mucosal thickening. Normal visualized bone  marrow signal. Visualized scalp soft tissues are within normal limits.  IMPRESSION: 1. Normal non contrast MRI appearance of the brain. No evidence cerebral edema, but note that early metastatic disease to the brain is difficult to exclude in the absence of intravenous contrast. 2. Small 11 mm dural calcification or less likely small meningioma at the left vertex. This appears to be inconsequential, with no associated mass effect.   Electronically Signed   By: Lars Pinks M.D.   On: 12/19/2013 11:30       Assessment & Plan:   Dizziness with visual disturbance. Negative neuro not for evaluation. Reassurance provided. Maybe sensitivity to heat. No apparent adverse reaction to medications  Problem List Items Addressed This Visit   DIABETES MELLITUS, BORDERLINE - Primary      Diet controlled (take cinnamon herbal tx) Check a1c now Lab Results  Component Value Date   HGBA1C 5.9 07/08/2013      Relevant Orders      Hemoglobin A1c      Basic metabolic panel   Gout   Relevant Medications      colchicine 0.6 MG tablet   HYPERCHOLESTEROLEMIA, MILD     rx'd low dose prava as LDL>100 12/2012 Weight trends reviewed The patient is asked to make an attempt to improve diet and exercise patterns to aid in medical management of this problem. Check annually    Relevant Medications      pravastatin (PRAVACHOL) tablet   Vision disturbance     Working with optho on same - no primary occular or neuro problem identified reviewed interval, reassurance provided

## 2013-12-29 NOTE — Assessment & Plan Note (Signed)
Working with optho on same - no primary occular or neuro problem identified reviewed interval, reassurance provided

## 2013-12-29 NOTE — Patient Instructions (Signed)
It was good to see you today.  We have reviewed your prior records including labs and tests today  Test(s) ordered today. Your results will be released to [place] (or called to you) after review, usually within 72hours after test completion. If any changes need to be made, you will be notified at that same time.  Medications reviewed and updated, no changes recommended at this time. Refill on medication(s) as discussed today.  we'll make referral to   . Our office will contact you regarding appointment(s) once made.  Please schedule followup in 3-4 months, call sooner if problems.

## 2014-01-04 ENCOUNTER — Other Ambulatory Visit (HOSPITAL_COMMUNITY): Payer: BC Managed Care – PPO

## 2014-01-06 ENCOUNTER — Ambulatory Visit: Payer: BC Managed Care – PPO | Admitting: Internal Medicine

## 2014-01-06 ENCOUNTER — Telehealth: Payer: Self-pay | Admitting: *Deleted

## 2014-01-06 NOTE — Telephone Encounter (Signed)
Ok to DC both prava and colchicine if joint pain is better

## 2014-01-06 NOTE — Telephone Encounter (Signed)
Notified pt with md response. Pt is wanting  to have her uric acid level recheck. She stated they gave her the colchicine because her level was high...Janet Le

## 2014-01-06 NOTE — Telephone Encounter (Signed)
Left msg on triage requesting callback. Called pt back she stated dr. Linna Darner told her not to take the pravastatin while she is on the colchicine. She has 2 pills left on the colchicine and wanting to know if she need to continue taking. She stated she think the pravastatin was causing the joint pain but want md recommendation...Janet Le

## 2014-01-12 ENCOUNTER — Ambulatory Visit: Payer: BC Managed Care – PPO | Admitting: Neurology

## 2014-01-12 ENCOUNTER — Telehealth: Payer: Self-pay | Admitting: Neurology

## 2014-01-12 NOTE — Telephone Encounter (Signed)
Pt canceled appt to see Dr Tomi Likens on 01-12-14 and will call back to [place]

## 2014-01-14 ENCOUNTER — Other Ambulatory Visit (HOSPITAL_COMMUNITY): Payer: Self-pay | Admitting: Internal Medicine

## 2014-01-14 DIAGNOSIS — H539 Unspecified visual disturbance: Secondary | ICD-10-CM

## 2014-01-15 ENCOUNTER — Ambulatory Visit (HOSPITAL_COMMUNITY): Payer: BC Managed Care – PPO | Attending: Internal Medicine | Admitting: Radiology

## 2014-01-15 DIAGNOSIS — I079 Rheumatic tricuspid valve disease, unspecified: Secondary | ICD-10-CM | POA: Insufficient documentation

## 2014-01-15 DIAGNOSIS — E785 Hyperlipidemia, unspecified: Secondary | ICD-10-CM | POA: Insufficient documentation

## 2014-01-15 DIAGNOSIS — H539 Unspecified visual disturbance: Secondary | ICD-10-CM

## 2014-01-15 DIAGNOSIS — I10 Essential (primary) hypertension: Secondary | ICD-10-CM | POA: Insufficient documentation

## 2014-01-15 DIAGNOSIS — I379 Nonrheumatic pulmonary valve disorder, unspecified: Secondary | ICD-10-CM | POA: Insufficient documentation

## 2014-01-15 DIAGNOSIS — E119 Type 2 diabetes mellitus without complications: Secondary | ICD-10-CM | POA: Insufficient documentation

## 2014-01-15 NOTE — Progress Notes (Signed)
Echocardiogram performed.  

## 2014-01-18 ENCOUNTER — Ambulatory Visit: Payer: BC Managed Care – PPO | Admitting: Physical Medicine & Rehabilitation

## 2014-03-13 ENCOUNTER — Other Ambulatory Visit: Payer: Self-pay | Admitting: Internal Medicine

## 2014-04-07 ENCOUNTER — Other Ambulatory Visit: Payer: Self-pay | Admitting: Internal Medicine

## 2014-04-09 ENCOUNTER — Other Ambulatory Visit: Payer: Self-pay

## 2014-05-14 ENCOUNTER — Other Ambulatory Visit: Payer: Self-pay | Admitting: Internal Medicine

## 2014-05-26 ENCOUNTER — Other Ambulatory Visit: Payer: Self-pay | Admitting: Radiology

## 2014-05-26 ENCOUNTER — Other Ambulatory Visit (HOSPITAL_COMMUNITY): Payer: Self-pay | Admitting: Interventional Radiology

## 2014-05-26 ENCOUNTER — Encounter: Payer: Self-pay | Admitting: Radiology

## 2014-05-26 DIAGNOSIS — N2889 Other specified disorders of kidney and ureter: Secondary | ICD-10-CM

## 2014-05-27 ENCOUNTER — Encounter: Payer: Self-pay | Admitting: Radiology

## 2014-06-08 LAB — CREATININE WITH EST GFR
Creat: 0.85 mg/dL (ref 0.50–1.10)
GFR, Est African American: 87 mL/min
GFR, Est Non African American: 76 mL/min

## 2014-06-08 LAB — BUN: BUN: 20 mg/dL (ref 6–23)

## 2014-06-08 NOTE — Progress Notes (Signed)
Duplicate/disregard

## 2014-06-08 NOTE — Progress Notes (Signed)
Duplicate letter/disregard

## 2014-06-10 ENCOUNTER — Encounter (HOSPITAL_COMMUNITY): Payer: Self-pay

## 2014-06-10 ENCOUNTER — Ambulatory Visit
Admission: RE | Admit: 2014-06-10 | Discharge: 2014-06-10 | Disposition: A | Discharge status: Home / Self Care | Payer: BC Managed Care – PPO | Source: Ambulatory Visit | Attending: Interventional Radiology | Admitting: Interventional Radiology

## 2014-06-10 ENCOUNTER — Ambulatory Visit (HOSPITAL_COMMUNITY)
Admission: RE | Admit: 2014-06-10 | Discharge: 2014-06-10 | Disposition: A | Discharge status: Home / Self Care | Payer: BC Managed Care – PPO | Source: Ambulatory Visit | Attending: Interventional Radiology | Admitting: Interventional Radiology

## 2014-06-10 DIAGNOSIS — N2889 Other specified disorders of kidney and ureter: Secondary | ICD-10-CM

## 2014-06-10 MED ORDER — IOHEXOL 300 MG/ML  SOLN
100.0000 mL | Freq: Once | INTRAMUSCULAR | Status: AC | PRN
  Administered 2014-06-10: 100 mL via INTRAVENOUS

## 2014-06-10 NOTE — Progress Notes (Signed)
Denies hematuria or other problems with urination.  Denies pain associated with cryoablation.  Weight loss of approx 40 lbs since June 2015 with dietary changes.    Bobbijo Holst Riki Rusk, RN 06/10/2014 1:11 PM

## 2014-06-10 NOTE — Progress Notes (Signed)
Chief Complaint: Chief Complaint  Patient presents with  . Advice Only    3 yr follow up Cryoablation of Right Renal Mass     Referring Physician(s): Yamagata,Glenn T  History of Present Illness: Janet Le is a 58 y.o. 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 She underwent percutaneous cryoablation performed by Dr. Aletta Edouard in August 2012. She presents today for her scheduled a 3 year post ablation.    Janet Le continues to do very well. She denies symptoms of right flank pain, hematuria or significant dysuria. She is also very pleased to report that she has made dietary changes over the past 6 months after being diagnosed as a prediabetic. Due to overall healthier eating, she has lost approximately 40 pounds and reports that she feels much better overall. She is currently relatively asymptomatic and has no complaints.  Past Medical History  Diagnosis Date  . GERD (gastroesophageal reflux disease)   . Hypertension   . Chronic constipation   . Hiatal hernia   . Endometriosis   . Syncopal episodes     since childhood  . Chronic sinusitis   . Osteoarthritis   . Hyperlipidemia   . Allergic rhinitis, cause unspecified   . Irritable bowel syndrome   . Hemorrhoids   . Esophageal stricture   . Hiatal hernia   . Diabetes mellitus     diet controlled  . Renal cell cancer 11/2010 dx    R, s/p cryoablation 02/16/11    Past Surgical History  Procedure Laterality Date  . Breast surgery      bilateral  . Tubal ligation    . Laser ablation of the cervix    . Functional endoscopic sinus surgery    . Fallopian tube removed    . Renal cryoablation  02/16/11    R kidney due to [place] (IR procedure)    Allergies: Sertraline hcl; Ace inhibitors; Pravastatin; Codeine; Darifenacin hydrobromide er; Gabapentin; Propoxyphene hcl; and Wellbutrin  Medications: Prior to Admission medications     Medication Sig Start Date End Date Taking? Authorizing Provider  ACCU-CHEK SOFTCLIX LANCETS lancets by Other route. Use twice a day for blood sugar     Historical Provider, MD  diclofenac sodium (VOLTAREN) 1 % GEL Apply 2 g topically 4 (four) times daily. 12/18/13   Charlett Blake, MD  furosemide (LASIX) 20 MG tablet Take 2 tablets (40 mg total) by mouth every other day. 07/08/13   Rowe Clack, MD  glucose blood (ONETOUCH VERIO) test strip Use  To check blood sugars once a day Dx 250.00 11/23/13   Rowe Clack, MD  Linaclotide [facility]) 290 MCG CAPS capsule Take 1 capsule (290 mcg total) by mouth daily. 10/09/13   Jerene Bears, MD  losartan (COZAAR) 100 MG tablet Take 1 tablet (100 mg total) by mouth daily. 12/15/13   Hendricks Limes, MD  Multiple Vitamins-Minerals (HAIR/SKIN/NAILS PO) Take 1 tablet by mouth 3 (three) times daily.    Historical Provider, MD  [facility] DELICA LANCETS 47Q MISC 1 each by Does not apply route daily. Use to help check blood sugars once daily Dx 250.00 11/23/13   Rowe Clack, MD  pantoprazole (PROTONIX) 40 MG tablet Take 1 tablet (40 mg total) by mouth daily. 10/09/13   Jerene Bears, MD  propranolol (INDERAL) 80 MG tablet TAKE ONE TABLET BY MOUTH TWICE DAILY 04/07/14   Rowe Clack, MD  propranolol (INDERAL) 80 MG tablet TAKE ONE TABLET BY MOUTH TWICE DAILY 05/14/14   Rowe Clack, MD  traMADol-acetaminophen (ULTRACET) 37.5-325 MG per tablet Take 1 tablet by mouth every 8 (eight) hours as needed. 12/18/13   Charlett Blake, MD    Family History  Problem Relation Age of Onset  . Diabetes Mother   . Kidney disease Father   . Prostate cancer Father   . Colitis Brother   . Leukemia Brother   . Multiple sclerosis Sister   . Colon polyps    . Colon cancer Neg Hx     History   Social History  . Marital Status: Married    Spouse Name: N/A    Number of Children: N/A  . Years of Education: N/A   Occupational History  .  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 Smoking status: Never Smoker   . Smokeless tobacco: Never Used  . Alcohol Use: No     Comment: rare  . Drug Use: No  . Sexual Activity: No   Other Topics Concern  . Not on file   Social History Narrative    ECOG Status: 0 - Asymptomatic  Review of Systems: A 12 point ROS discussed and pertinent positives are indicated in the HPI above.  All other systems are negative.  Review of Systems  Vital Signs: BP 133/79 mmHg  Pulse 78  Temp(Src) 98.1 F (36.7 C) (Oral)  Resp 15  SpO2 100%  Physical Exam  Constitutional: She is oriented to person, place, and time. She appears well-developed and well-nourished. No distress.  HENT:  Head: Normocephalic and atraumatic.  Eyes: No scleral icterus.  Cardiovascular: Normal rate.   Pulmonary/Chest: Effort normal.  Neurological: She is alert and oriented to person, place, and time.  Skin: Skin is warm and dry.  Psychiatric: She has a normal mood and affect. Her behavior is normal.  Nursing note and vitals reviewed.   Imaging: Ct Abd Wo & W Cm  06/10/2014   CLINICAL DATA:  Three year follow-up follow-up right renal neoplasm or cryoablation. Cryoablation in August 2012.  EXAM: CT ABDOMEN WITHOUT AND WITH CONTRAST  TECHNIQUE: Multidetector CT imaging of the abdomen was performed following the standard protocol before and following the bolus administration of intravenous contrast.  CONTRAST:  117mL OMNIPAQUE IOHEXOL 300 MG/ML  SOLN  COMPARISON:  CT 06/02/2013, 12/12/ 13, 10/ 17/12  FINDINGS: Renal: Rounded nodular density at the right lower pole cryoablation site is not changed in size measuring 10 x 10 mm (image 57, series 2) compared to 11 mm x 10 mm (image 55, series 4) . There is no significant enhancement at the cryoablation site.  There is no enhancing renal cortical lesion on the left or right. There is renal cortical thinning.  No focal  hepatic lesion. No biliary duct dilatation. Gallbladder is normal. Common bile duct is normal. Normal spleen with stable small hypodense lesion. Adrenal glands are normal.  Stomach and limited view of the small bowel are unremarkable. Appendix is normal.  Abdominal aorta is normal caliber. There is no retroperitoneal or periportal lymphadenopathy. No pelvic lymphadenopathy. Degenerate changes of the spine. No aggressive osseous lesion.  Lung bases are clear.  IMPRESSION: 1. Stable cryoablation site in the inferior pole of the right kidney. No evidence of local recurrence. 2. No enhancing renal lesion on the left or right.   Electronically Signed   By: Nicole Kindred  Leonia Reeves M.D.   On: 06/10/2014 11:40    Labs:  CBC:  Recent Labs  07/08/13 1124 12/08/13 0903  WBC 8.3 8.3  HGB 12.7 12.7  HCT 38.1 37.8  PLT 134.0* 153.0    COAGS: No results for input(s): INR, APTT in the last 8760 hours.  BMP:  Recent Labs  07/08/13 1124 11/12/13 1152 12/29/13 1137 06/07/14 1211  NA 139  --  137  --   K 4.2  --  4.0  --   CL 105  --  103  --   CO2 29  --  29  --   GLUCOSE 96  --  99  --   BUN 26* 23 16 20   CALCIUM 9.8  --  9.7  --   CREATININE 0.9 1.6* 1.0 0.85  GFRNONAA  --   --   --  76  GFRAA  --   --   --  87    LIVER FUNCTION TESTS:  Recent Labs  07/08/13 1124  BILITOT 0.6  AST 13  ALT 15  ALKPHOS 87  PROT 7.6  ALBUMIN 3.7    TUMOR MARKERS: No results for input(s): AFPTM, CEA, CA199, CHROMGRNA in the last 8760 hours.  Assessment and Plan:  Janet Le is doing exceptionally well 3 years status post a percutaneous cryoablation of her right renal neoplasm. She remains cancer free without imaging evidence of local recurrence or metachronous renal lesion.  Additionally, she should be commended for her recent dietary changes and 40 pound weight loss. Keep up the good work!  She will require continued annual surveillance for the next 2 years. Return to clinic in 1 year with repeat  CT scan of the abdomen with and without contrast.  Signed,  Criselda Peaches, MD    I spent a total of 15 minutes face to face in clinical consultation, greater than 50% of which was counseling/coordinating care for renal neoplasm s/p cryoablation.   SignedJacqulynn Cadet 06/10/2014, 1:26 PM

## 2014-06-12 ENCOUNTER — Other Ambulatory Visit: Payer: Self-pay | Admitting: Internal Medicine

## 2014-06-15 ENCOUNTER — Telehealth: Payer: Self-pay | Admitting: Internal Medicine

## 2014-06-15 DIAGNOSIS — I10 Essential (primary) hypertension: Secondary | ICD-10-CM

## 2014-06-15 DIAGNOSIS — I951 Orthostatic hypotension: Secondary | ICD-10-CM

## 2014-06-15 NOTE — Telephone Encounter (Signed)
Patient is out of BP meds.  She is requesting a refill.  She said pharmacy states it was denied.  She does have an upcoming appointment.

## 2014-06-16 MED ORDER — LOSARTAN POTASSIUM 100 MG PO TABS: 100.0000 mg | ORAL_TABLET | Freq: Every day | ORAL | Status: DC

## 2014-06-16 MED ORDER — FUROSEMIDE 20 MG PO TABS
40.0000 mg | ORAL_TABLET | ORAL | Status: DC
Start: 2014-06-16 — End: 2015-05-31

## 2014-06-16 NOTE — Telephone Encounter (Signed)
erx done.  My chart message sent to pt.

## 2014-07-01 ENCOUNTER — Ambulatory Visit (INDEPENDENT_AMBULATORY_CARE_PROVIDER_SITE_OTHER): Payer: BLUE CROSS/BLUE SHIELD | Admitting: Internal Medicine

## 2014-07-01 ENCOUNTER — Other Ambulatory Visit (INDEPENDENT_AMBULATORY_CARE_PROVIDER_SITE_OTHER): Payer: BLUE CROSS/BLUE SHIELD

## 2014-07-01 ENCOUNTER — Encounter: Payer: Self-pay | Admitting: Internal Medicine

## 2014-07-01 VITALS — BP 120/84 | HR 60 | Temp 98.2°F | Resp 12 | Ht 62.0 in | Wt 218.0 lb

## 2014-07-01 DIAGNOSIS — E669 Obesity, unspecified: Secondary | ICD-10-CM

## 2014-07-01 DIAGNOSIS — M810 Age-related osteoporosis without current pathological fracture: Secondary | ICD-10-CM

## 2014-07-01 DIAGNOSIS — R300 Dysuria: Secondary | ICD-10-CM

## 2014-07-01 DIAGNOSIS — E119 Type 2 diabetes mellitus without complications: Secondary | ICD-10-CM

## 2014-07-01 DIAGNOSIS — Z1239 Encounter for other screening for malignant neoplasm of breast: Secondary | ICD-10-CM

## 2014-07-01 DIAGNOSIS — Z23 Encounter for immunization: Secondary | ICD-10-CM

## 2014-07-01 DIAGNOSIS — K219 Gastro-esophageal reflux disease without esophagitis: Secondary | ICD-10-CM

## 2014-07-01 LAB — URINALYSIS, ROUTINE W REFLEX MICROSCOPIC
Bilirubin Urine: NEGATIVE
Hgb urine dipstick: NEGATIVE
Ketones, ur: NEGATIVE
Leukocytes, UA: NEGATIVE
Nitrite: NEGATIVE
RBC / HPF: NONE SEEN (ref 0–?)
Specific Gravity, Urine: 1.01 (ref 1.000–1.030)
Total Protein, Urine: NEGATIVE
Urine Glucose: NEGATIVE
Urobilinogen, UA: 0.2 (ref 0.0–1.0)
pH: 7 (ref 5.0–8.0)

## 2014-07-01 LAB — TSH: TSH: 2.52 u[IU]/mL (ref 0.35–4.50)

## 2014-07-01 LAB — LIPID PANEL
Cholesterol: 217 mg/dL — ABNORMAL HIGH (ref 0–200)
HDL: 50.5 mg/dL (ref >39.00)
LDL Cholesterol: 158 mg/dL — ABNORMAL HIGH (ref 0–99)
NonHDL: 166.5
Total CHOL/HDL Ratio: 4
Triglycerides: 43 mg/dL (ref 0.0–149.0)
VLDL: 8.6 mg/dL (ref 0.0–40.0)

## 2014-07-01 LAB — HEMOGLOBIN A1C: Hgb A1c MFr Bld: 5.8 % (ref 4.6–6.5)

## 2014-07-01 MED ORDER — PANTOPRAZOLE SODIUM 40 MG PO TBEC: 40.0000 mg | DELAYED_RELEASE_TABLET | Freq: Every day | ORAL | Status: DC

## 2014-07-01 MED ORDER — PROPRANOLOL HCL 80 MG PO TABS: 80.0000 mg | ORAL_TABLET | Freq: Two times a day (BID) | ORAL | Status: DC

## 2014-07-01 MED ORDER — ALENDRONATE SODIUM 70 MG PO TABS: 70.0000 mg | ORAL_TABLET | ORAL | Status: DC

## 2014-07-01 NOTE — Assessment & Plan Note (Signed)
Diet controlled (take cinnamon herbal tx) Significant, intentional weight reduction since 09/2013 -start 268#, now 218# Check a1c now, if <5.5, will change to hx of DM Lab Results  Component Value Date   HGBA1C 5.8 12/29/2013

## 2014-07-01 NOTE — Progress Notes (Signed)
Pre visit review using our clinic review tool, if applicable. No additional management support is needed unless otherwise documented below in the visit note. 

## 2014-07-01 NOTE — Patient Instructions (Signed)
It was good to see you today.  We have reviewed your prior records including labs and tests today  Flu immunization and Prevnar 13 immunization administered today  Test(s) ordered today. Your results will be released to [place] (or called to you) after review, usually within 72hours after test completion. If any changes need to be made, you will be notified at that same time.  Medications reviewed and updated, no changes recommended at this time. Refill on medication(s) as discussed today.  Continue following up on potential for LAP-BAND or other bariatric surgery as discussed  Congratulations on your weight loss. Continue the good work  We'll make referral to [facility] for mammogram and bone density as requested  Please schedule followup in 6 months, call sooner if problems.

## 2014-07-01 NOTE — Progress Notes (Signed)
Subjective:    Patient ID: Janet Le, female    DOB: [birth year], 59 y.o.   MRN: [id number]  HPI  Patient is here for follow up  Reviewed chronic medical issues and interval medical events  Past Medical History  Diagnosis Date  . GERD (gastroesophageal reflux disease)   . Hypertension   . Chronic constipation   . Hiatal hernia   . Endometriosis   . Syncopal episodes     since childhood  . Chronic sinusitis   . Osteoarthritis   . Hyperlipidemia   . Allergic rhinitis, cause unspecified   . Irritable bowel syndrome   . Hemorrhoids   . Esophageal stricture   . Hiatal hernia   . Diabetes mellitus     diet controlled  . Renal cell cancer 11/2010 dx    R, s/p cryoablation 02/16/11    Review of Systems  Constitutional: Negative for fever, fatigue and unexpected weight change.  Respiratory: Negative for cough and shortness of breath.   Cardiovascular: Negative for chest pain and leg swelling.  Skin:       Hair thinning, chronic - female pattern at preauricular and crown       Objective:   Physical Exam  BP 120/84 mmHg  Pulse 60  Temp(Src) 98.2 F (36.8 C) (Oral)  Resp 12  Ht 5\' 2"  (1.575 m)  Wt 218 lb (98.884 kg)  BMI 39.86 kg/m2  SpO2 98% Wt Readings from Last 3 Encounters:  07/01/14 218 lb (98.884 kg)  12/29/13 254 lb (115.214 kg)  12/18/13 251 lb (113.853 kg)   Constitutional: She is obese, appears well-developed and well-nourished. No distress.  Neck: Normal range of motion. Neck supple. No JVD present. No thyromegaly present.  Cardiovascular: Normal rate, regular rhythm and normal heart sounds.  No murmur heard. No BLE edema. Pulmonary/Chest: Effort normal and breath sounds normal. No respiratory distress. She has no wheezes.  Psychiatric: She has a normal mood and affect. Her behavior is normal. Judgment and thought content normal.   Lab Results  Component Value Date   WBC 8.3 12/08/2013   HGB 12.7 12/08/2013   HCT 37.8 12/08/2013   PLT 153.0  12/08/2013   GLUCOSE 99 12/29/2013   CHOL 168 07/08/2013   TRIG 84.0 07/08/2013   HDL 52.90 07/08/2013   LDLDIRECT 143.8 07/01/2012   LDLCALC 98 07/08/2013   ALT 15 07/08/2013   AST 13 07/08/2013   NA 137 12/29/2013   K 4.0 12/29/2013   CL 103 12/29/2013   CREATININE 0.85 06/07/2014   BUN 20 06/07/2014   CO2 29 12/29/2013   TSH 3.03 07/08/2013   INR 0.91 02/08/2011   HGBA1C 5.8 12/29/2013   MICROALBUR 0.8 01/07/2013    Ct Abd Wo & W Cm  06/10/2014   CLINICAL DATA:  Three year follow-up follow-up right renal neoplasm or cryoablation. Cryoablation in August 2012.  EXAM: CT ABDOMEN WITHOUT AND WITH CONTRAST  TECHNIQUE: Multidetector CT imaging of the abdomen was performed following the standard protocol before and following the bolus administration of intravenous contrast.  CONTRAST:  152mL OMNIPAQUE IOHEXOL 300 MG/ML  SOLN  COMPARISON:  CT 06/02/2013, 12/12/ 13, 10/ 17/12  FINDINGS: Renal: Rounded nodular density at the right lower pole cryoablation site is not changed in size measuring 10 x 10 mm (image 57, series 2) compared to 11 mm x 10 mm (image 55, series 4) . There is no significant enhancement at the cryoablation site.  There is no enhancing renal cortical lesion on  the left or right. There is renal cortical thinning.  No focal hepatic lesion. No biliary duct dilatation. Gallbladder is normal. Common bile duct is normal. Normal spleen with stable small hypodense lesion. Adrenal glands are normal.  Stomach and limited view of the small bowel are unremarkable. Appendix is normal.  Abdominal aorta is normal caliber. There is no retroperitoneal or periportal lymphadenopathy. No pelvic lymphadenopathy. Degenerate changes of the spine. No aggressive osseous lesion.  Lung bases are clear.  IMPRESSION: 1. Stable cryoablation site in the inferior pole of the right kidney. No evidence of local recurrence. 2. No enhancing renal lesion on the left or right.   Electronically Signed   By: Suzy Bouchard M.D.   On: 06/10/2014 11:40       Assessment & Plan:   Problem List Items Addressed This Visit    Diet-controlled diabetes mellitus - Primary    Diet controlled (take cinnamon herbal tx) Significant, intentional weight reduction since 09/2013 -start 268#, now 218# Check a1c now, if <5.5, will change to hx of DM Lab Results  Component Value Date   HGBA1C 5.8 12/29/2013       Relevant Orders      Hemoglobin A1c      Lipid panel   GERD (gastroesophageal reflux disease)   Relevant Medications      pantoprazole (PROTONIX) EC tablet   Obesity    Wt Readings from Last 3 Encounters:  07/01/14 218 lb (98.884 kg)  12/29/13 254 lb (115.214 kg)  12/18/13 251 lb (113.853 kg)   Body mass index is 39.86 kg/(m^2).  Intentional weight reduction with diet changes reviewed. Encouragement provided Agree with plans to consider LAP-BAND for further bariatric intervention    Relevant Orders      TSH   Osteoporosis    DEXA January 2014 reviewed in depth today started Fosamax weekly  07/2012 with increased supplemental calcium and vitamin D and Educated to increase weightbearing exercise activity plan to recheck now (letter from [employer] reviewed)    Relevant Medications      alendronate (FOSAMAX) 70 MG tablet   Other Relevant Orders      DG Bone Density    Other Visit Diagnoses    Screening breast examination        Relevant Orders       MM DIGITAL SCREENING BILATERAL

## 2014-07-01 NOTE — Assessment & Plan Note (Signed)
DEXA January 2014 reviewed in depth today started Fosamax weekly  07/2012 with increased supplemental calcium and vitamin D and Educated to increase weightbearing exercise activity plan to recheck now (letter from [employer] reviewed)

## 2014-07-01 NOTE — Assessment & Plan Note (Signed)
Wt Readings from Last 3 Encounters:  07/01/14 218 lb (98.884 kg)  12/29/13 254 lb (115.214 kg)  12/18/13 251 lb (113.853 kg)   Body mass index is 39.86 kg/(m^2).  Intentional weight reduction with diet changes reviewed. Encouragement provided Agree with plans to consider LAP-BAND for further bariatric intervention

## 2014-07-06 ENCOUNTER — Telehealth: Payer: Self-pay

## 2014-07-06 NOTE — Telephone Encounter (Signed)
Pt was called last week by Ledell Noss per Dr. Asa Lente. Pt was called to confirm whether or not both vaccines were given. Pt stated that she told Abbie that she was suppose to be given the flu vaccine.  It is documented that the prevnar was given. Pt stated that she did get some information (VIS) about the vaccine.   Pt is not comfortable with coming back in. She will call back to let us know which VIS she received.   PCP is wanting the pt to get the flu vaccine.

## 2014-07-26 ENCOUNTER — Ambulatory Visit (HOSPITAL_COMMUNITY)
Admission: RE | Admit: 2014-07-26 | Discharge: 2014-07-26 | Disposition: A | Discharge status: Home / Self Care | Payer: BLUE CROSS/BLUE SHIELD | Source: Ambulatory Visit | Attending: Internal Medicine | Admitting: Internal Medicine

## 2014-07-26 DIAGNOSIS — Z1231 Encounter for screening mammogram for malignant neoplasm of breast: Secondary | ICD-10-CM | POA: Diagnosis not present

## 2014-07-26 DIAGNOSIS — M810 Age-related osteoporosis without current pathological fracture: Secondary | ICD-10-CM

## 2014-07-26 DIAGNOSIS — Z13820 Encounter for screening for osteoporosis: Secondary | ICD-10-CM | POA: Insufficient documentation

## 2014-07-26 DIAGNOSIS — Z1239 Encounter for other screening for malignant neoplasm of breast: Secondary | ICD-10-CM

## 2014-08-04 ENCOUNTER — Other Ambulatory Visit: Payer: Self-pay | Admitting: Obstetrics and Gynecology

## 2014-08-05 LAB — CYTOLOGY - PAP

## 2014-08-06 ENCOUNTER — Encounter: Payer: Self-pay | Admitting: Internal Medicine

## 2014-08-06 DIAGNOSIS — D509 Iron deficiency anemia, unspecified: Secondary | ICD-10-CM

## 2014-08-10 LAB — HEPATIC FUNCTION PANEL
ALT: 16 U/L (ref 7–35)
AST: 34 U/L (ref 13–35)
Alkaline Phosphatase: 73 U/L (ref 25–125)
Bilirubin, Total: 0.5 mg/dL

## 2014-08-10 LAB — TSH: TSH: 2.44 u[IU]/mL (ref 0.41–5.90)

## 2014-08-10 LAB — BASIC METABOLIC PANEL
BUN: 16 mg/dL (ref 4–21)
Creatinine: 0.9 mg/dL (ref 0.5–1.1)
Glucose: 84 mg/dL
Potassium: 4.1 mmol/L (ref 3.4–5.3)
Sodium: 142 mmol/L (ref 137–147)

## 2014-08-10 LAB — CBC AND DIFFERENTIAL
Hemoglobin: 12.2 g/dL (ref 12.0–16.0)
Platelets: 124 10*3/uL — AB (ref 150–399)
WBC: 6.2 10^3/mL

## 2014-09-08 ENCOUNTER — Encounter: Payer: Self-pay | Admitting: Family

## 2014-09-08 ENCOUNTER — Ambulatory Visit (INDEPENDENT_AMBULATORY_CARE_PROVIDER_SITE_OTHER)
Admission: RE | Admit: 2014-09-08 | Discharge: 2014-09-08 | Disposition: A | Discharge status: Home / Self Care | Payer: BLUE CROSS/BLUE SHIELD | Source: Ambulatory Visit | Attending: Family | Admitting: Family

## 2014-09-08 ENCOUNTER — Ambulatory Visit (INDEPENDENT_AMBULATORY_CARE_PROVIDER_SITE_OTHER): Payer: BLUE CROSS/BLUE SHIELD | Admitting: Family

## 2014-09-08 VITALS — BP 110/70 | HR 74 | Temp 98.0°F | Resp 18 | Ht 62.0 in | Wt 225.0 lb

## 2014-09-08 DIAGNOSIS — M25562 Pain in left knee: Secondary | ICD-10-CM

## 2014-09-08 DIAGNOSIS — M25561 Pain in right knee: Secondary | ICD-10-CM | POA: Insufficient documentation

## 2014-09-08 HISTORY — DX: Pain in left knee: M25.562

## 2014-09-08 MED ORDER — NAPROXEN 500 MG PO TABS: 500.0000 mg | ORAL_TABLET | Freq: Two times a day (BID) | ORAL | Status: DC

## 2014-09-08 NOTE — Progress Notes (Signed)
Pre visit review using our clinic review tool, if applicable. No additional management support is needed unless otherwise documented below in the visit note. 

## 2014-09-08 NOTE — Assessment & Plan Note (Signed)
Symptoms and exam consistent with left knee contusion, however cannot rule out meniscal pathology or fracture. Obtain left knee x-rays to rule out fracture. Treat conservatively at this time with ice, compression, elevation and rest. Start naproxen 500 mg twice a day when necessary. If symptoms worsen or fail to improve in about a week's time, we'll consider ordering an MRI to rule out meniscal pathology. Follow-up if symptoms worsen or fail to improve.

## 2014-09-08 NOTE — Progress Notes (Signed)
Subjective:    Patient ID: Janet Le, female    DOB: 10-20-1955, 59 y.o.   MRN: [id number]  Chief Complaint  Patient presents with  . Fall    fell yesterday evening, says her knee locked and the next thing she knew she was on the floor, when she puts pressure on her left knee it hurts and has a pain going down her left leg, she describes the pain as throbbing pain    HPI:  Janet Le is a 59 y.o. female who presents today for an acute visit.   This is a new problem. Associated symptoms of pain located in her left knee and going down her leg is described as throbbing and  has been going on since last evening. Indicates she felt her knee lock  and then she was on the ground. Denies any sounds or sensations heard or felt. Indicates that she is not able to put much weight on it. Has tried pain gel but did not do anything for the pain. Intensity of the pain is rated a 8/10.     Allergies  Allergen Reactions  . Sertraline Hcl Other (See Comments)    Spasms; numbness  . Ace Inhibitors Cough  . Pravastatin     myalgias  . Codeine Palpitations  . Darifenacin Hydrobromide Er Other (See Comments)    Pt states made her feel queezy & drunk  . Gabapentin Other (See Comments)    Hallucinations  . Propoxyphene Hcl Palpitations  . Wellbutrin [Bupropion Hcl] Other (See Comments)    Numbness of mouth/lips    Current Outpatient Prescriptions on File Prior to Visit  Medication Sig Dispense Refill  . ACCU-CHEK SOFTCLIX LANCETS lancets by Other route. Use twice a day for blood sugar     . alendronate (FOSAMAX) 70 MG tablet Take 1 tablet (70 mg total) by mouth once a week. Take with a full glass of water on an empty stomach. 12 tablet 3  . furosemide (LASIX) 20 MG tablet Take 2 tablets (40 mg total) by mouth every other day. 180 tablet 1  . glucose blood (ONETOUCH VERIO) test strip Use  To check blood sugars once a day Dx 250.00 100 each 3  . Linaclotide (LINZESS) 290 MCG CAPS capsule  Take 1 capsule (290 mcg total) by mouth daily. 30 capsule 11  . losartan (COZAAR) 100 MG tablet Take 1 tablet (100 mg total) by mouth daily. 30 tablet 5  . Multiple Vitamins-Minerals (HAIR/SKIN/NAILS PO) Take 1 tablet by mouth 3 (three) times daily.    [facility] DELICA LANCETS 38V MISC 1 each by Does not apply route daily. Use to help check blood sugars once daily Dx 250.00 100 each 3  . pantoprazole (PROTONIX) 40 MG tablet Take 1 tablet (40 mg total) by mouth daily. 90 tablet 11  . propranolol (INDERAL) 80 MG tablet Take 1 tablet (80 mg total) by mouth 2 (two) times daily. 60 tablet 5  . traMADol-acetaminophen (ULTRACET) 37.5-325 MG per tablet Take 1 tablet by mouth every 8 (eight) hours as needed. 90 tablet 1   No current facility-administered medications on file prior to visit.    Past Medical History  Diagnosis Date  . GERD (gastroesophageal reflux disease)   . Hypertension   . Chronic constipation   . Hiatal hernia   . Endometriosis   . Syncopal episodes     since childhood  . Chronic sinusitis   . Osteoarthritis   . Hyperlipidemia   .  Allergic rhinitis, cause unspecified   . Irritable bowel syndrome   . Hemorrhoids   . Esophageal stricture   . Hiatal hernia   . Diabetes mellitus     diet controlled  . Renal cell cancer 11/2010 dx    R, s/p cryoablation 02/16/11     Review of Systems  Musculoskeletal:       Positive for knee pain.   Neurological: Negative for numbness.      Objective:    BP 110/70 mmHg  Pulse 74  Temp(Src) 98 F (36.7 C) (Oral)  Resp 18  Ht 5\' 2"  (1.575 m)  Wt 225 lb (102.059 kg)  BMI 41.14 kg/m2  SpO2 97% Nursing note and vital signs reviewed.  Physical Exam  Constitutional: She is oriented to person, place, and time. She appears well-developed and well-nourished. No distress.  Cardiovascular: Normal rate, regular rhythm, normal heart sounds and intact distal pulses.   Pulmonary/Chest: Effort normal and breath sounds normal.    Musculoskeletal:  Left knee: No obvious deformity or discoloration noted. Mild edema present compared to contralateral side. Palpable tenderness along the lateral joint line. Displays full range of motion in flexion and extension. Ligamentous testing is negative. Some tenderness with McMurray's on the lateral joint line. Distal pulses and sensation are intact and appropriate.  Neurological: She is alert and oriented to person, place, and time.  Skin: Skin is warm and dry.  Psychiatric: She has a normal mood and affect. Her behavior is normal. Judgment and thought content normal.       Assessment & Plan:

## 2014-09-08 NOTE — Patient Instructions (Addendum)
Thank you for choosing [facility].  Summary/Instructions:  Ice your knee 2-3 times per day for about 20 minutes up to every other hour as needed for pain. After 72 hours change to heat or ice as needed.   Please take the Naproxen for the next 4 days and then as needed.  May try a knee brace.  Your prescription(s) have been submitted to your pharmacy or been printed and provided for you. Please take as directed and contact our office if you believe you are having problem(s) with the medication(s) or have any questions.  If your symptoms worsen or fail to improve, please contact our office for further instruction, or in case of emergency go directly to the emergency room at the closest medical facility.

## 2014-10-11 ENCOUNTER — Telehealth: Payer: Self-pay | Admitting: Internal Medicine

## 2014-10-11 DIAGNOSIS — K5900 Constipation, unspecified: Secondary | ICD-10-CM

## 2014-10-11 MED ORDER — LINACLOTIDE 290 MCG PO CAPS
290.0000 ug | ORAL_CAPSULE | Freq: Every day | ORAL | Status: DC
Start: 2014-10-11 — End: 2014-11-16

## 2014-10-11 NOTE — Telephone Encounter (Signed)
30 day rx sent. Patient needs to schedule office visit for further refills.

## 2014-11-08 ENCOUNTER — Other Ambulatory Visit: Payer: Self-pay | Admitting: Internal Medicine

## 2014-11-11 ENCOUNTER — Encounter: Payer: Self-pay | Admitting: Gastroenterology

## 2014-11-14 ENCOUNTER — Other Ambulatory Visit: Payer: Self-pay | Admitting: Internal Medicine

## 2014-11-16 ENCOUNTER — Other Ambulatory Visit: Payer: Self-pay | Admitting: Internal Medicine

## 2014-12-04 ENCOUNTER — Other Ambulatory Visit: Payer: Self-pay | Admitting: Internal Medicine

## 2014-12-06 ENCOUNTER — Telehealth: Payer: Self-pay | Admitting: Internal Medicine

## 2014-12-06 MED ORDER — LOSARTAN POTASSIUM 100 MG PO TABS: 100.0000 mg | ORAL_TABLET | Freq: Every day | ORAL | Status: DC

## 2014-12-06 NOTE — Telephone Encounter (Signed)
Patient need a new prescription of her medication Losartan 100 mg

## 2014-12-20 ENCOUNTER — Other Ambulatory Visit: Payer: Self-pay

## 2015-01-03 ENCOUNTER — Other Ambulatory Visit: Payer: Self-pay | Admitting: Internal Medicine

## 2015-01-04 ENCOUNTER — Other Ambulatory Visit: Payer: Self-pay | Admitting: Internal Medicine

## 2015-01-04 MED ORDER — LINACLOTIDE 290 MCG PO CAPS: ORAL_CAPSULE | ORAL | Status: DC

## 2015-01-04 NOTE — Telephone Encounter (Signed)
30 days sent and NO further refills will be sent unless 01/20/15 appt is kept.  Pt aware

## 2015-01-05 ENCOUNTER — Encounter: Payer: Self-pay | Admitting: Internal Medicine

## 2015-01-05 ENCOUNTER — Other Ambulatory Visit (INDEPENDENT_AMBULATORY_CARE_PROVIDER_SITE_OTHER): Payer: BLUE CROSS/BLUE SHIELD

## 2015-01-05 ENCOUNTER — Ambulatory Visit (INDEPENDENT_AMBULATORY_CARE_PROVIDER_SITE_OTHER): Payer: BLUE CROSS/BLUE SHIELD | Admitting: Internal Medicine

## 2015-01-05 VITALS — BP 120/80 | HR 62 | Temp 98.1°F | Ht 62.0 in | Wt 231.0 lb

## 2015-01-05 DIAGNOSIS — E7800 Pure hypercholesterolemia, unspecified: Secondary | ICD-10-CM

## 2015-01-05 DIAGNOSIS — E669 Obesity, unspecified: Secondary | ICD-10-CM

## 2015-01-05 DIAGNOSIS — I10 Essential (primary) hypertension: Secondary | ICD-10-CM | POA: Diagnosis not present

## 2015-01-05 DIAGNOSIS — I890 Lymphedema, not elsewhere classified: Secondary | ICD-10-CM

## 2015-01-05 DIAGNOSIS — E119 Type 2 diabetes mellitus without complications: Secondary | ICD-10-CM

## 2015-01-05 LAB — HEMOGLOBIN A1C: Hgb A1c MFr Bld: 5.5 % (ref 4.6–6.5)

## 2015-01-05 LAB — MICROALBUMIN / CREATININE URINE RATIO
Creatinine,U: 374.6 mg/dL
Microalb Creat Ratio: 0.3 mg/g (ref 0.0–30.0)
Microalb, Ur: 1 mg/dL (ref 0.0–1.9)

## 2015-01-05 MED ORDER — ATORVASTATIN CALCIUM 10 MG PO TABS: 10.0000 mg | ORAL_TABLET | Freq: Every day | ORAL | Status: DC

## 2015-01-05 MED ORDER — FLUTICASONE PROPIONATE 50 MCG/ACT NA SUSP: 1.0000 | Freq: Every day | NASAL | Status: DC

## 2015-01-05 NOTE — Assessment & Plan Note (Signed)
rx'd low dose prava 20mg  as LDL>100 12/2012 - intermittent compliance due to myalgias - stopped same 12/2013 Weight trends reviewed and risks of LDL>100 Pt agrees to try alternate statin - we select atorva 10 qd The patient is asked to make an attempt to improve diet and exercise patterns to aid in medical management of this problem. Check annually with LFT

## 2015-01-05 NOTE — Assessment & Plan Note (Signed)
Chronic symptoms, intolerant of compression hose Reassurance no varicose veins evident on exam Continue effort at weight reduction as ongoing

## 2015-01-05 NOTE — Assessment & Plan Note (Signed)
Wt Readings from Last 3 Encounters:  01/05/15 231 lb (104.781 kg)  09/08/14 225 lb (102.059 kg)  07/01/14 218 lb (98.884 kg)   Body mass index is 42.24 kg/(m^2).  Weight trends with diet changes reviewed. Encouragement provided Agree with plans to consider LAP-BAND for further bariatric intervention as needed

## 2015-01-05 NOTE — Patient Instructions (Signed)
It was good to see you today.  We have reviewed your prior records including labs and tests today  Test(s) ordered today. Your results will be released to [facility] (or called to you) after review, usually within 72hours after test completion. If any changes need to be made, you will be notified at that same time.  Medications reviewed and updated Start low-dose atorvastatin 10 mg once daily for cholesterol management No other changes recommended at this time. Refill on medication(s) as discussed today.  Please schedule followup in 3-4 months for diabetes mellitus check and flu shot, call sooner if problems.

## 2015-01-05 NOTE — Progress Notes (Signed)
Subjective:    Patient ID: Janet Le, female    DOB: 11-04-1955, 59 y.o.   MRN: [id number]  HPI  Patient here for follow-up. Reviewed chronic conditions, interval events and current concerns  Past Medical History  Diagnosis Date  . GERD (gastroesophageal reflux disease)   . Hypertension   . Chronic constipation   . Hiatal hernia   . Endometriosis   . Syncopal episodes     since childhood  . Chronic sinusitis   . Osteoarthritis   . Hyperlipidemia   . Allergic rhinitis, cause unspecified   . Irritable bowel syndrome   . Hemorrhoids   . Esophageal stricture   . Hiatal hernia   . Diabetes mellitus     diet controlled  . Renal cell cancer 11/2010 dx    R, s/p cryoablation 02/16/11    Review of Systems  Constitutional: Positive for fatigue. Negative for unexpected weight change.  Respiratory: Negative for cough and shortness of breath.   Cardiovascular: Negative for chest pain and leg swelling.       Objective:    Physical Exam  Constitutional: She appears well-developed and well-nourished. No distress.  obese  Cardiovascular: Normal rate, regular rhythm and normal heart sounds.   No murmur heard. +BLE lymphedema, nonpitting  Pulmonary/Chest: Effort normal and breath sounds normal. No respiratory distress.    BP 120/80 mmHg  Pulse 62  Temp(Src) 98.1 F (36.7 C) (Oral)  Ht 5\' 2"  (1.575 m)  Wt 231 lb (104.781 kg)  BMI 42.24 kg/m2  SpO2 98% Wt Readings from Last 3 Encounters:  01/05/15 231 lb (104.781 kg)  09/08/14 225 lb (102.059 kg)  07/01/14 218 lb (98.884 kg)    Lab Results  Component Value Date   WBC 6.2 08/10/2014   HGB 12.2 08/10/2014   HCT 37.8 12/08/2013   PLT 124* 08/10/2014   GLUCOSE 99 12/29/2013   CHOL 217* 07/01/2014   TRIG 43.0 07/01/2014   HDL 50.50 07/01/2014   LDLDIRECT 143.8 07/01/2012   LDLCALC 158* 07/01/2014   ALT 16 08/10/2014   AST 34 08/10/2014   NA 142 08/10/2014   K 4.1 08/10/2014   CL 103 12/29/2013   CREATININE  0.9 08/10/2014   BUN 16 08/10/2014   CO2 29 12/29/2013   TSH 2.44 08/10/2014   INR 0.91 02/08/2011   HGBA1C 5.8 07/01/2014   MICROALBUR 0.8 01/07/2013    Dg Knee Complete 4 Views Left  09/08/2014   CLINICAL DATA:  Acute left knee pain after fall yesterday. Initial encounter.  EXAM: LEFT KNEE - COMPLETE 4+ VIEW  COMPARISON:  July 15, 2013.  FINDINGS: There is no evidence of fracture, dislocation, or joint effusion. Severe narrowing of medial joint space is noted. Soft tissues are unremarkable.  IMPRESSION: Severe degenerative joint disease is noted medially. No acute abnormality seen in the left knee.   Electronically Signed   By: Marijo Conception, M.D.   On: 09/08/2014 16:51       Assessment & Plan:   Problem List Items Addressed This Visit    Diet-controlled diabetes mellitus - Primary    Diet controlled (take cinnamon herbal tx) Significant, intentional weight reduction since 09/2013 -start 268#, down to 218# in 06/2014, but upward trend since Check a1c now and q36mo; when/if a1c <5.5, will change to hx of DM Lab Results  Component Value Date   HGBA1C 5.8 07/01/2014        Relevant Medications   atorvastatin (LIPITOR) 10 MG tablet  Other Relevant Orders   Hemoglobin A1c   Microalbumin / creatinine urine ratio   Essential hypertension    BP Readings from Last 3 Encounters:  01/05/15 120/80  09/08/14 110/70  07/01/14 120/84   Stopped ACEI due to cough 06/2012-  Now on ARB and HCT combo but variable compliance Added inderal 04/2012 - improved The current medical regimen is effective;  continue present plan and medications.         Relevant Medications   atorvastatin (LIPITOR) 10 MG tablet   HYPERCHOLESTEROLEMIA, MILD    rx'd low dose prava 20mg  as LDL>100 12/2012 - intermittent compliance due to myalgias - stopped same 12/2013 Weight trends reviewed and risks of LDL>100 Pt agrees to try alternate statin - we select atorva 10 qd The patient is asked to make an attempt  to improve diet and exercise patterns to aid in medical management of this problem. Check annually with LFT       Relevant Medications   atorvastatin (LIPITOR) 10 MG tablet   Lymphedema    Chronic symptoms, intolerant of compression hose Reassurance no varicose veins evident on exam Continue effort at weight reduction as ongoing      Obesity    Wt Readings from Last 3 Encounters:  01/05/15 231 lb (104.781 kg)  09/08/14 225 lb (102.059 kg)  07/01/14 218 lb (98.884 kg)   Body mass index is 42.24 kg/(m^2).  Weight trends with diet changes reviewed. Encouragement provided Agree with plans to consider LAP-BAND for further bariatric intervention as needed          Gwendolyn Grant, MD

## 2015-01-05 NOTE — Assessment & Plan Note (Signed)
Diet controlled (take cinnamon herbal tx) Significant, intentional weight reduction since 09/2013 -start 268#, down to 218# in 06/2014, but upward trend since Check a1c now and q3mo; when/if a1c <5.5, will change to hx of DM Lab Results  Component Value Date   HGBA1C 5.8 07/01/2014

## 2015-01-05 NOTE — Assessment & Plan Note (Addendum)
BP Readings from Last 3 Encounters:  01/05/15 120/80  09/08/14 110/70  07/01/14 120/84   Stopped ACEI due to cough 06/2012-  Now on ARB and HCT combo but variable compliance Added inderal 04/2012 - improved The current medical regimen is effective;  continue present plan and medications.

## 2015-01-05 NOTE — Progress Notes (Signed)
Pre visit review using our clinic review tool, if applicable. No additional management support is needed unless otherwise documented below in the visit note. 

## 2015-01-06 ENCOUNTER — Encounter: Payer: Self-pay | Admitting: Internal Medicine

## 2015-01-06 NOTE — Addendum Note (Signed)
Addended by: Gwendolyn Grant A on: 01/06/2015 11:42 AM   Modules accepted: Miquel Dunn

## 2015-01-20 ENCOUNTER — Ambulatory Visit (INDEPENDENT_AMBULATORY_CARE_PROVIDER_SITE_OTHER): Payer: BLUE CROSS/BLUE SHIELD | Admitting: Internal Medicine

## 2015-01-20 ENCOUNTER — Encounter: Payer: Self-pay | Admitting: Internal Medicine

## 2015-01-20 ENCOUNTER — Telehealth: Payer: Self-pay | Admitting: Family Medicine

## 2015-01-20 VITALS — BP 128/88 | HR 76 | Ht 61.81 in | Wt 238.5 lb

## 2015-01-20 DIAGNOSIS — K5900 Constipation, unspecified: Secondary | ICD-10-CM

## 2015-01-20 DIAGNOSIS — R140 Abdominal distension (gaseous): Secondary | ICD-10-CM

## 2015-01-20 DIAGNOSIS — K589 Irritable bowel syndrome without diarrhea: Secondary | ICD-10-CM

## 2015-01-20 DIAGNOSIS — K219 Gastro-esophageal reflux disease without esophagitis: Secondary | ICD-10-CM | POA: Diagnosis not present

## 2015-01-20 DIAGNOSIS — K5909 Other constipation: Secondary | ICD-10-CM

## 2015-01-20 MED ORDER — LINACLOTIDE 145 MCG PO CAPS: 145.0000 ug | ORAL_CAPSULE | Freq: Every day | ORAL | Status: DC

## 2015-01-20 NOTE — Patient Instructions (Addendum)
We have sent the following medications to your pharmacy for you to pick up at your convenience: Linzess 145 mcg daily (decrease from 290 mcg daily)  Continue Protonix 40 mg daily.  Please purchase the following medications over the counter and take as directed: Align once daily OR Florastor twice daily OR VSL #3  Please follow the low gas diet we have given you today (seperate brochure)  CC: Dr Gwendolyn Grant

## 2015-01-20 NOTE — Telephone Encounter (Signed)
I have scheduled patient for 8/11 for right knee pain.  If there is a cancellation or if she can be worked in sooner please give a call.

## 2015-01-20 NOTE — Progress Notes (Signed)
Subjective:    Patient ID: Janet Le, female    DOB: 12-02-1955, 59 y.o.   MRN: [id number]  HPI Janet Le is a 59 -year-old female with history of chronic constipation, GERD, obesity, diabetes, hypertension, RCC status post cryoablation who is here for follow-up. She was last seen in April 2015. She reports that overall she is doing well. She's been able to lose nearly 40 pounds by changing her diet and avoiding sweets. She has gained some of this weight back and was recently seen by primary care. Her A1c was found to be 5.5  Her biggest complaint is abdominal bloating after eating along with belching. Heartburn is well controlled on pantoprazole. No dysphagia or odynophagia. No nausea or vomiting. Denies early satiety. Tries to avoid carbonated beverages. Does drink water with vinegar because she feels this helps her blood pressure and joint pain. She continues on Linzess 290 g daily. This causes a bowel movement every day but stools are liquid and watery. Never formed. Occur 3-4 times each morning after taking the dose.  Review of Systems As per HPI, otherwise negative  Current Medications, Allergies, Past Medical History, Past Surgical History, Family History and Social History were reviewed in [employer] record.     Objective:   Physical Exam BP 128/88 mmHg  Pulse 76  Ht 5' 1.81" (1.57 m)  Wt 238 lb 8 oz (108.183 kg)  BMI 43.89 kg/m2 Constitutional: Well-developed and well-nourished. No distress. HEENT: Normocephalic and atraumatic. Oropharynx is clear and moist. No oropharyngeal exudate. Conjunctivae are normal.  No scleral icterus. Neck: Neck supple. Trachea midline. Cardiovascular: Normal rate, regular rhythm and intact distal pulses. No M/R/G Pulmonary/chest: Effort normal and breath sounds normal. No wheezing, rales or rhonchi. Abdominal: Soft, obese, nontender, nondistended. Bowel sounds active throughout.  Extremities: no clubbing,  cyanosis, or edema Lymphadenopathy: No cervical adenopathy noted. Neurological: Alert and oriented to person place and time. Skin: Skin is warm and dry. No rashes noted. Psychiatric: Normal mood and affect. Behavior is normal.  CBC    Component Value Date/Time   WBC 6.2 08/10/2014   WBC 8.3 12/08/2013 0903   RBC 4.08 12/08/2013 0903   HGB 12.2 08/10/2014   HCT 37.8 12/08/2013 0903   PLT 124* 08/10/2014   MCV 92.7 12/08/2013 0903   MCH 29.7 02/08/2011 1120   MCHC 33.5 12/08/2013 0903   RDW 14.7 12/08/2013 0903   LYMPHSABS 2.2 12/08/2013 0903   MONOABS 0.6 12/08/2013 0903   EOSABS 0.2 12/08/2013 0903   BASOSABS 0.0 12/08/2013 0903    CMP     Component Value Date/Time   NA 142 08/10/2014   NA 137 12/29/2013 1137   K 4.1 08/10/2014   CL 103 12/29/2013 1137   CO2 29 12/29/2013 1137   GLUCOSE 99 12/29/2013 1137   BUN 16 08/10/2014   BUN 20 06/07/2014 1211   CREATININE 0.9 08/10/2014   CREATININE 0.85 06/07/2014 1211   CREATININE 1.0 12/29/2013 1137   CALCIUM 9.7 12/29/2013 1137   PROT 7.6 07/08/2013 1124   ALBUMIN 3.7 07/08/2013 1124   AST 34 08/10/2014   ALT 16 08/10/2014   ALKPHOS 73 08/10/2014   BILITOT 0.6 07/08/2013 1124   GFRNONAA 76 06/07/2014 1211   GFRNONAA >60 02/08/2011 1120   GFRAA 87 06/07/2014 1211   GFRAA >60 02/08/2011 1120   CT abdomen and pelvis December 2015 -- follow-up RCC, no acute abnormalities  Colonoscopy 2012 -- normal follow-up 10 years EGD December 2013 -- normal  Esophageal manometry 2011 -- normal    Assessment & Plan:  59 -year-old female with history of chronic constipation, GERD, obesity, diabetes, hypertension, RCC status post cryoablation who is here for follow-up.  1. Chronic constipation -- to me it sounds like she is having side effect of Linzess namely diarrhea. We discussed that with Linzess the ideal bowel movement is soft formed and complete not watery. With this in mind I will decrease her dose to 145 g daily. She may  not have a bowel movement daily but the goal is to avoid constipation but also diarrhea. She will call if she feels constipated or has issues with the new lower dose  2. GERD -- well-controlled on pantoprazole continue 40 mg daily  3. Gas, bloating -- likely irritable bowel related. I doubt she has gastroparesis without nausea, vomiting and with normal A1c. I recommended a low bloating diet and she was given a copy of this diet today. Avoid carbonated beverages. Again probiotic with either a line, Florastor or VSL No. 3. I encouraged her to use the probiotic daily and not skip doses as sporadic use can actually increase bloating.  4. CRC screening -- repeat colonoscopy due in 2022, she denies a family history of colon cancer  Follow-up in one year, sooner if necessary  25 minutes spent with the patient today

## 2015-01-26 ENCOUNTER — Telehealth: Payer: Self-pay | Admitting: Internal Medicine

## 2015-01-26 NOTE — Telephone Encounter (Signed)
I have spoken to patient who states that pharmacy told her they do not have Cliff. I contacted Sameer at [facility] who states that rx is on patient profile but for some reason was never filled. He will fill this in the next 20-30 minutes for patient. Patient verbalizes understanding.

## 2015-02-01 NOTE — Telephone Encounter (Signed)
Spoke to pt, offered pt earlier appt on 8.10.16. She decided to come in then.

## 2015-02-02 ENCOUNTER — Other Ambulatory Visit: Payer: Self-pay | Admitting: *Deleted

## 2015-02-02 ENCOUNTER — Other Ambulatory Visit (INDEPENDENT_AMBULATORY_CARE_PROVIDER_SITE_OTHER): Payer: BLUE CROSS/BLUE SHIELD

## 2015-02-02 ENCOUNTER — Encounter: Payer: Self-pay | Admitting: Family Medicine

## 2015-02-02 ENCOUNTER — Ambulatory Visit (INDEPENDENT_AMBULATORY_CARE_PROVIDER_SITE_OTHER): Payer: BLUE CROSS/BLUE SHIELD | Admitting: Family Medicine

## 2015-02-02 VITALS — BP 128/84 | HR 67 | Ht 62.0 in | Wt 230.0 lb

## 2015-02-02 DIAGNOSIS — IMO0002 Reserved for concepts with insufficient information to code with codable children: Secondary | ICD-10-CM | POA: Insufficient documentation

## 2015-02-02 DIAGNOSIS — M25561 Pain in right knee: Secondary | ICD-10-CM | POA: Diagnosis not present

## 2015-02-02 DIAGNOSIS — M129 Arthropathy, unspecified: Secondary | ICD-10-CM | POA: Diagnosis not present

## 2015-02-02 MED ORDER — DICLOFENAC SODIUM 2 % TD SOLN: TRANSDERMAL | Status: DC

## 2015-02-02 NOTE — Progress Notes (Signed)
Pre visit review using our clinic review tool, if applicable. No additional management support is needed unless otherwise documented below in the visit note. 

## 2015-02-02 NOTE — Progress Notes (Addendum)
Janet Le, [place] [address] Phone: [phone] Subjective:    I'm seeing this patient by the request  of:  Gwendolyn Grant, MD   CC: Right knee pain  [id number] Tema Alire Janet Le is a 59 y.o. female coming in with complaint of right knee pain. Patient did hit her knee previously and did have a contusion on the inside of her knee. Patient did have x-rays at that time and these were reviewed by me. Patient's x-ray showed severe medial compartment arthritis. This is 5 months ago. Patient states it is giving her significant difficult he. Patient states that she does too much activity is very sore. States that standing can hurt as well as taken upstairs. Dull throbbing pain at night. No radiation. Couple episodes of locking but no giving out on her. Concern though that the reason she fell initially back in March could've been secondary to the knee pain.  Past Medical History  Diagnosis Date  . GERD (gastroesophageal reflux disease)   . Hypertension   . Chronic constipation   . Hiatal hernia   . Endometriosis   . Syncopal episodes     since childhood  . Chronic sinusitis   . Osteoarthritis   . Hyperlipidemia   . Allergic rhinitis, cause unspecified   . Irritable bowel syndrome   . Hemorrhoids   . Esophageal stricture   . Hiatal hernia   . Renal cell cancer 11/2010 dx    R, s/p cryoablation 02/16/11  . Personal history of diabetes mellitus     controlled with diet only   Past Surgical History  Procedure Laterality Date  . Breast surgery      bilateral  . Tubal ligation    . Laser ablation of the cervix    . Functional endoscopic sinus surgery    . Fallopian tube removed    . Renal cryoablation  02/16/11    R kidney due to [place] (IR procedure)   Social History  Substance Use Topics  . Smoking status: Never Smoker   . Smokeless tobacco: Never Used  . Alcohol Use: No     Comment: rare   Allergies  Allergen Reactions  .  Sertraline Hcl Other (See Comments)    Spasms; numbness  . Ace Inhibitors Cough  . Pravastatin     myalgias  . Codeine Palpitations  . Darifenacin Hydrobromide Er Other (See Comments)    Pt states made her feel queezy & drunk  . Gabapentin Other (See Comments)    Hallucinations  . Propoxyphene Hcl Palpitations  . Wellbutrin [Bupropion Hcl] Other (See Comments)    Numbness of mouth/lips   Family History  Problem Relation Age of Onset  . Diabetes Mother   . Kidney disease Father   . Prostate cancer Father   . Colitis Brother   . Leukemia Brother   . Multiple sclerosis Sister   . Colon polyps    . Colon cancer Neg Hx        Past medical history, social, surgical and family history all reviewed in electronic medical record.   Review of Systems: No headache, visual changes, nausea, vomiting, diarrhea, constipation, dizziness, abdominal pain, skin rash, fevers, chills, night sweats, weight loss, swollen lymph nodes, body aches, joint swelling, muscle aches, chest pain, shortness of breath, mood changes.   Objective Blood pressure 128/84, pulse 67, height 5\' 2"  (1.575 m), weight 230 lb (104.327 kg), SpO2 98 %.  General: No apparent distress alert and  oriented x3 mood and affect normal, dressed appropriately.  HEENT: Pupils equal, extraocular movements intact  Respiratory: Patient's speak in full sentences and does not appear short of breath  Cardiovascular: No lower extremity edema, non tender, no erythema  Skin: Warm dry intact with no signs of infection or rash on extremities or on axial skeleton.  Abdomen: Soft nontender  Neuro: Cranial nerves II through XII are intact, neurovascularly intact in all extremities with 2+ DTRs and 2+ pulses.  Lymph: No lymphadenopathy of posterior or anterior cervical chain or axillae bilaterally.  Gait normal with good balance and coordination.  MSK:  Non tender with full range of motion and good stability and symmetric strength and tone of  shoulders, elbows, wrist, hip, and ankles bilaterally. Mild arthritis of multiple joints Knee: Right knee Mild valgus deformity of the knees bilaterally Tender over the medial joint line ROM full in flexion and extension and lower leg rotation. Ligaments with solid consistent endpoints including ACL, PCL, LCL, MCL. Negative Mcmurray's, Apley's, and Thessalonian tests.  painful patellar compression. Patellar glide with moderate crepitus. Patellar and quadriceps tendons unremarkable. Hamstring and quadriceps strength is normal.    MSK US performed of: Right knee This study was ordered, performed, and interpreted by Charlann Boxer D.O.  Knee: All structures visualized. Trace effusion of the suprapatellar pouch Moderate to severe medial joint line narrowing degenerative changes of the meniscus Patellar Tendon unremarkable on long and transverse views without effusion. No abnormality of prepatellar bursa. LCL and MCL unremarkable on long and transverse views. No abnormality of origin of medial or lateral head of the gastrocnemius.  IMPRESSION:  Moderate arthritis of the knee with degenerative changes of the meniscus    Impression and Recommendations:     This case required medical decision making of moderate complexity.

## 2015-02-02 NOTE — Addendum Note (Signed)
Addended by: Lyndal Pulley on: 02/02/2015 01:58 PM   Modules accepted: Miquel Dunn

## 2015-02-02 NOTE — Assessment & Plan Note (Signed)
Patient does have moderate osteoarthritic changes. Patient given home exercises, icing protocol, we discussed over-the-counter natural supple mentation's eye think will be beneficial. We discussed the possibility of injection which patient declined today as well as formal physical therapy which patient declined. Patient may need a medial unloader brace with it being more of a unilateral arthritis. Patient will consider this in the future. Patient come back and see me again in 4 weeks.

## 2015-02-02 NOTE — Patient Instructions (Signed)
Good to see you.  Ice 20 minutes 2 times daily. Usually after activity and before bed. Exercises 3 times a week.  pennsaid pinkie amount topically 2 times daily as needed.  Take tylenol 500 mg three times a day is the best evidence based medicine we have for arthritis.  Vitamin D 2000 IU daily Fish oil 2 grams daily.  Tumeric 500mg  twice daily.  Capsaicin topically up to four times a day may also help with pain. Cortisone injections are an option if these interventions do not seem to make a difference or need more relief.  If cortisone injections do not help, there are different types of shots that may help but they take longer to take effect.  We can discuss this at follow up.  It's important that you continue to stay active. Controlling your weight is important.  Consider physical therapy to strengthen muscles around the joint that hurts to take pressure off of the joint itself. Shoe inserts with good arch support may be helpful.  Spenco orthotics at [employer] could help.  Water aerobics and cycling with low resistance are the best two types of exercise for arthritis. Come back and see me in 4 weeks.

## 2015-02-02 NOTE — Telephone Encounter (Signed)
Refill done.  

## 2015-02-03 ENCOUNTER — Ambulatory Visit: Payer: BLUE CROSS/BLUE SHIELD | Admitting: Family Medicine

## 2015-02-08 LAB — HM DIABETES EYE EXAM

## 2015-02-15 ENCOUNTER — Encounter: Payer: Self-pay | Admitting: Internal Medicine

## 2015-02-23 ENCOUNTER — Ambulatory Visit: Payer: BLUE CROSS/BLUE SHIELD | Admitting: Family Medicine

## 2015-05-10 ENCOUNTER — Ambulatory Visit: Payer: BLUE CROSS/BLUE SHIELD | Admitting: Internal Medicine

## 2015-05-12 ENCOUNTER — Other Ambulatory Visit (HOSPITAL_COMMUNITY): Payer: Self-pay | Admitting: Interventional Radiology

## 2015-05-12 DIAGNOSIS — N2889 Other specified disorders of kidney and ureter: Secondary | ICD-10-CM

## 2015-05-17 ENCOUNTER — Other Ambulatory Visit: Payer: Self-pay | Admitting: Radiology

## 2015-05-17 ENCOUNTER — Telehealth: Payer: Self-pay | Admitting: Hematology

## 2015-05-17 ENCOUNTER — Encounter: Payer: Self-pay | Admitting: Radiology

## 2015-05-17 DIAGNOSIS — N2889 Other specified disorders of kidney and ureter: Secondary | ICD-10-CM

## 2015-05-17 NOTE — Telephone Encounter (Signed)
new patient appt-s/w Santiago Glad @ [facility] and gave np appt for 12/15 @ 11 w/Dr. Burr Medico.  Referring Dr. Earnstine Regal Dx- Low plts/inc'd mpv  Referral information scanned

## 2015-05-26 LAB — CREATININE WITH EST GFR
Creat: 0.89 mg/dL (ref 0.50–1.05)
GFR, Est African American: 82 mL/min (ref >=60)
GFR, Est Non African American: 71 mL/min (ref >=60)

## 2015-05-26 LAB — BUN: BUN: 14 mg/dL (ref 7–25)

## 2015-05-30 ENCOUNTER — Ambulatory Visit (INDEPENDENT_AMBULATORY_CARE_PROVIDER_SITE_OTHER): Payer: BLUE CROSS/BLUE SHIELD | Admitting: Internal Medicine

## 2015-05-30 ENCOUNTER — Encounter: Payer: Self-pay | Admitting: Internal Medicine

## 2015-05-30 ENCOUNTER — Other Ambulatory Visit (INDEPENDENT_AMBULATORY_CARE_PROVIDER_SITE_OTHER): Payer: BLUE CROSS/BLUE SHIELD

## 2015-05-30 VITALS — BP 144/82 | HR 66 | Temp 98.7°F | Resp 18 | Wt 246.0 lb

## 2015-05-30 DIAGNOSIS — I10 Essential (primary) hypertension: Secondary | ICD-10-CM

## 2015-05-30 DIAGNOSIS — I890 Lymphedema, not elsewhere classified: Secondary | ICD-10-CM

## 2015-05-30 DIAGNOSIS — D696 Thrombocytopenia, unspecified: Secondary | ICD-10-CM

## 2015-05-30 DIAGNOSIS — Z139 Encounter for screening, unspecified: Secondary | ICD-10-CM

## 2015-05-30 HISTORY — DX: Thrombocytopenia, unspecified: D69.6

## 2015-05-30 LAB — CBC WITH DIFFERENTIAL/PLATELET
Basophils Absolute: 0 10*3/uL (ref 0.0–0.1)
Basophils Relative: 0.4 % (ref 0.0–3.0)
Eosinophils Absolute: 0.1 10*3/uL (ref 0.0–0.7)
Eosinophils Relative: 2.1 % (ref 0.0–5.0)
HCT: 38.8 % (ref 36.0–46.0)
Hemoglobin: 12.6 g/dL (ref 12.0–15.0)
Lymphocytes Relative: 26.9 % (ref 12.0–46.0)
Lymphs Abs: 1.9 10*3/uL (ref 0.7–4.0)
MCHC: 32.6 g/dL (ref 30.0–36.0)
MCV: 92.8 fl (ref 78.0–100.0)
Monocytes Absolute: 0.5 10*3/uL (ref 0.1–1.0)
Monocytes Relative: 7.8 % (ref 3.0–12.0)
Neutro Abs: 4.4 10*3/uL (ref 1.4–7.7)
Neutrophils Relative %: 62.8 % (ref 43.0–77.0)
Platelets: 127 10*3/uL — ABNORMAL LOW (ref 150.0–400.0)
RBC: 4.18 Mil/uL (ref 3.87–5.11)
RDW: 14.8 % (ref 11.5–15.5)
WBC: 7 10*3/uL (ref 4.0–10.5)

## 2015-05-30 LAB — COMPREHENSIVE METABOLIC PANEL
ALT: 10 U/L (ref 0–35)
AST: 17 U/L (ref 0–37)
Albumin: 3.8 g/dL (ref 3.5–5.2)
Alkaline Phosphatase: 88 U/L (ref 39–117)
BUN: 14 mg/dL (ref 6–23)
CO2: 29 mEq/L (ref 19–32)
Calcium: 9.9 mg/dL (ref 8.4–10.5)
Chloride: 107 mEq/L (ref 96–112)
Creatinine, Ser: 0.92 mg/dL (ref 0.40–1.20)
GFR: 80.12 mL/min (ref >60.00)
Glucose, Bld: 94 mg/dL (ref 70–99)
Potassium: 4.2 mEq/L (ref 3.5–5.1)
Sodium: 141 mEq/L (ref 135–145)
Total Bilirubin: 0.5 mg/dL (ref 0.2–1.2)
Total Protein: 7.4 g/dL (ref 6.0–8.3)

## 2015-05-30 LAB — HIV ANTIBODY (ROUTINE TESTING W REFLEX): HIV 1&2 Ab, 4th Generation: NONREACTIVE

## 2015-05-30 LAB — TSH: TSH: 1.92 u[IU]/mL (ref 0.35–4.50)

## 2015-05-30 NOTE — Patient Instructions (Addendum)
  We have reviewed your prior records including labs and tests today.  Test(s) ordered today. Your results will be released to [facility] (or called to you) after review, usually within 72hours after test completion. If any changes need to be made, you will be notified at that same time.   Medications reviewed and updated.  No changes recommended at this time.  Please schedule followup in 6 months

## 2015-05-30 NOTE — Assessment & Plan Note (Signed)
Thrombocytopenia is mild and dates back to 2010. She has had normal platelet counts over the years Asymptomatic No other concerning white count abnormalities No evidence of liver or spleen disease Discussed several possible causes Agree with her seeing hematology She is getting blood work done today so we will repeat her CBC

## 2015-05-30 NOTE — Assessment & Plan Note (Signed)
BP Readings from Last 3 Encounters:  05/30/15 144/82  02/02/15 128/84  01/20/15 128/88   Her blood pressure is typically well controlled. She is anxious at today's visit and believes that is why her blood pressure is elevated Continue current medications at current doses

## 2015-05-30 NOTE — Progress Notes (Signed)
Subjective:    Patient ID: Janet Le, female    DOB: Nov 14, 1955, 59 y.o.   MRN: [id number]  HPI She is here to establish with a new pcp.  She is here for follow up of abnormal blood work from her gyn.    GYN blood work last month and her platelet count was 143. She was told that it was slightly low, but overall improved. She recommended that she sees a hematologist and was referred. She was not given any further information. The remainder of her blood work was normal including estradiol, FSH, vitamin D, vitamin B12, hemoglobin/hematocrit, WBC and cholesterol. Her brother recently died of leukemia and she was concerned with low platelets meds. She denies any swollen lymph nodes. She denies any abnormal bruising or bleeding. She fully  One week ago she started having a tingling/burning sensation in her upper and middle back. It lasted three days and went away . She did not have any back pain or stiffness.  She was sleeping on a different bed and wondered if it was related to that.  She was also under increased stress because her brother just died.  She denied any numbness/tingling/ weakness in her arms or legs.    Medications and allergies reviewed with patient and updated if appropriate.  Patient Active Problem List   Diagnosis Date Noted  . Arthritis of left lower extremity 02/02/2015  . Left knee pain 09/08/2014  . Gout 12/11/2013  . Vision disturbance 12/08/2013  . Severe obesity (BMI >= 40) ([place]) 11/06/2013  . Osteoporosis   . Cough 07/15/2012  . Left lumbar radiculopathy 04/13/2012  . Renal cell cancer ([place])   . External hemorrhoid 11/14/2010  . IBS (irritable bowel syndrome) 11/14/2010  . GERD (gastroesophageal reflux disease) 11/14/2010  . Urinary frequency 08/18/2010  . HYPERCHOLESTEROLEMIA, MILD 03/16/2010  . Lymphedema 03/15/2010  . Obesity 10/03/2009  . ALLERGIC RHINITIS 08/31/2009  . GLOBUS HYSTERICUS 08/26/2009  . MIGRAINE HEADACHE 06/14/2009  . SINUSITIS,  CHRONIC 06/14/2009  . Headache(784.0) 06/14/2009  . Personal history of diabetes mellitus 06/14/2009  . SYNCOPE 05/16/2009  . Essential hypertension 05/06/2009  . CONSTIPATION, CHRONIC 05/06/2009  . ENDOMETRIOSIS 05/06/2009    Current Outpatient Prescriptions on File Prior to Visit  Medication Sig Dispense Refill  . ACCU-CHEK SOFTCLIX LANCETS lancets by Other route. Use twice a day for blood sugar     . alendronate (FOSAMAX) 70 MG tablet Take 1 tablet (70 mg total) by mouth once a week. Take with a full glass of water on an empty stomach. 12 tablet 3  . atorvastatin (LIPITOR) 10 MG tablet Take 1 tablet (10 mg total) by mouth daily. 90 tablet 3  . Diclofenac Sodium 2 % SOLN Apply 1 pump twice daily. 112 g 3  . fluticasone (FLONASE) 50 MCG/ACT nasal spray Place 1 spray into both nostrils daily. 16 g 2  . furosemide (LASIX) 20 MG tablet Take 2 tablets (40 mg total) by mouth every other day. 180 tablet 1  . glucose blood (ONETOUCH VERIO) test strip Use one strip to check blood sugar daily 100 each 4  . Linaclotide (LINZESS) 145 MCG CAPS capsule Take 1 capsule (145 mcg total) by mouth daily. 30 capsule 3  . losartan (COZAAR) 100 MG tablet Take 1 tablet (100 mg total) by mouth daily. 90 tablet 4  . Multiple Vitamins-Minerals (HAIR/SKIN/NAILS PO) Take 1 tablet by mouth 3 (three) times daily.    . naproxen (NAPROSYN) 500 MG tablet Take 1 tablet (500  mg total) by mouth 2 (two) times daily with a meal. 60 tablet 0  . ONETOUCH DELICA LANCETS 99991111 MISC 1 each by Does not apply route daily. Use to help check blood sugars once daily Dx 250.00 100 each 3  . pantoprazole (PROTONIX) 40 MG tablet Take 1 tablet (40 mg total) by mouth daily. 90 tablet 11  . propranolol (INDERAL) 80 MG tablet Take 1 tablet (80 mg total) by mouth 2 (two) times daily. 60 tablet 5  . traMADol-acetaminophen (ULTRACET) 37.5-325 MG per tablet Take 1 tablet by mouth every 8 (eight) hours as needed. 90 tablet 1   No current  facility-administered medications on file prior to visit.    Past Medical History  Diagnosis Date  . GERD (gastroesophageal reflux disease)   . Hypertension   . Chronic constipation   . Hiatal hernia   . Endometriosis   . Syncopal episodes     since childhood  . Chronic sinusitis   . Osteoarthritis   . Hyperlipidemia   . Allergic rhinitis, cause unspecified   . Irritable bowel syndrome   . Hemorrhoids   . Esophageal stricture   . Hiatal hernia   . Renal cell cancer ([place]) 11/2010 dx    R, s/p cryoablation 02/16/11  . Personal history of diabetes mellitus     controlled with diet only    Past Surgical History  Procedure Laterality Date  . Breast surgery      bilateral  . Tubal ligation    . Laser ablation of the cervix    . Functional endoscopic sinus surgery    . Fallopian tube removed    . Renal cryoablation  02/16/11    R kidney due to [place] (IR procedure)    Social History   Social History  . Marital Status: Married    Spouse Name: N/A  . Number of Children: N/A  . Years of Education: N/A   Occupational History  . 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 Smoking status: Never Smoker   . Smokeless tobacco: Never Used  . Alcohol Use: No     Comment: rare  . Drug Use: No  . Sexual Activity: No   Other Topics Concern  . None   Social History Narrative    Review of Systems  Constitutional: Negative for fever, chills and unexpected weight change.       Occasional hot flashes  Respiratory: Positive for cough. Negative for shortness of breath and wheezing.   Cardiovascular: Positive for leg swelling. Negative for chest pain and palpitations.  Gastrointestinal:       GERD controlled  Neurological: Positive for headaches (occasional). Negative for dizziness and light-headedness.       Occasional balance is off  Hematological: Negative for adenopathy. Does not bruise/bleed easily.       Objective:    Filed Vitals:   05/30/15 0945  BP: 144/82  Pulse: 66  Temp: 98.7 F (37.1 C)  Resp: 18   Filed Weights   05/30/15 0945  Weight: 246 lb (111.585 kg)   Body mass index is 44.98 kg/(m^2).   Physical Exam Constitutional: Appears well-developed and well-nourished. No distress.  Neck: Neck supple. No tracheal deviation present. No thyromegaly present.  No carotid bruit. No cervical adenopathy.   Cardiovascular: Normal rate, regular rhythm and normal heart sounds.   No murmur heard. Pulmonary/Chest: Effort normal and breath sounds normal. No respiratory distress. No wheezes.  Musculoskeletal: No edema.        Assessment & Plan:   Numbness/tingling upper and middle back Symptoms have resolved Possibly related to muscle tightness secondary to increased stress, new bed Call or return if symptoms recur  See problem list for further assessment.  Follow-up in 6 months

## 2015-05-30 NOTE — Assessment & Plan Note (Signed)
Chronic, has not tolerated compression hose Currently taking Lasix every other day and wonders if she could take this daily Trial of Lasix on a daily basis Check CMP today

## 2015-05-30 NOTE — Progress Notes (Signed)
Pre visit review using our clinic review tool, if applicable. No additional management support is needed unless otherwise documented below in the visit note. 

## 2015-05-31 ENCOUNTER — Other Ambulatory Visit: Payer: Self-pay

## 2015-05-31 ENCOUNTER — Encounter: Payer: Self-pay | Admitting: Internal Medicine

## 2015-05-31 ENCOUNTER — Other Ambulatory Visit: Payer: Self-pay | Admitting: *Deleted

## 2015-05-31 DIAGNOSIS — K219 Gastro-esophageal reflux disease without esophagitis: Secondary | ICD-10-CM

## 2015-05-31 LAB — HEPATITIS C ANTIBODY: HCV Ab: NEGATIVE

## 2015-05-31 MED ORDER — LINACLOTIDE 145 MCG PO CAPS: 145.0000 ug | ORAL_CAPSULE | Freq: Every day | ORAL | Status: DC

## 2015-05-31 MED ORDER — FUROSEMIDE 20 MG PO TABS: 40.0000 mg | ORAL_TABLET | ORAL | Status: DC

## 2015-05-31 MED ORDER — PANTOPRAZOLE SODIUM 40 MG PO TBEC: 40.0000 mg | DELAYED_RELEASE_TABLET | Freq: Every day | ORAL | Status: DC

## 2015-05-31 MED ORDER — ALENDRONATE SODIUM 70 MG PO TABS: 70.0000 mg | ORAL_TABLET | ORAL | Status: DC

## 2015-05-31 MED ORDER — PROPRANOLOL HCL 80 MG PO TABS: 80.0000 mg | ORAL_TABLET | Freq: Two times a day (BID) | ORAL | Status: DC

## 2015-05-31 MED ORDER — LOSARTAN POTASSIUM 100 MG PO TABS: 100.0000 mg | ORAL_TABLET | Freq: Every day | ORAL | Status: DC

## 2015-05-31 MED ORDER — ATORVASTATIN CALCIUM 10 MG PO TABS: 10.0000 mg | ORAL_TABLET | Freq: Every day | ORAL | Status: DC

## 2015-05-31 MED ORDER — GLUCOSE BLOOD VI STRP: ORAL_STRIP | Status: DC

## 2015-06-01 ENCOUNTER — Ambulatory Visit
Admission: RE | Admit: 2015-06-01 | Discharge: 2015-06-01 | Disposition: A | Discharge status: Home / Self Care | Payer: BLUE CROSS/BLUE SHIELD | Source: Ambulatory Visit | Attending: Interventional Radiology | Admitting: Interventional Radiology

## 2015-06-01 ENCOUNTER — Ambulatory Visit (HOSPITAL_COMMUNITY)
Admission: RE | Admit: 2015-06-01 | Discharge: 2015-06-01 | Disposition: A | Discharge status: Home / Self Care | Payer: BLUE CROSS/BLUE SHIELD | Source: Ambulatory Visit | Attending: Interventional Radiology | Admitting: Interventional Radiology

## 2015-06-01 ENCOUNTER — Encounter (HOSPITAL_COMMUNITY): Payer: Self-pay

## 2015-06-01 DIAGNOSIS — Z9889 Other specified postprocedural states: Secondary | ICD-10-CM | POA: Insufficient documentation

## 2015-06-01 DIAGNOSIS — Z09 Encounter for follow-up examination after completed treatment for conditions other than malignant neoplasm: Secondary | ICD-10-CM | POA: Diagnosis present

## 2015-06-01 DIAGNOSIS — N2889 Other specified disorders of kidney and ureter: Secondary | ICD-10-CM

## 2015-06-01 MED ORDER — IOHEXOL 300 MG/ML  SOLN
100.0000 mL | Freq: Once | INTRAMUSCULAR | Status: AC | PRN
  Administered 2015-06-01: 100 mL via INTRAVENOUS

## 2015-06-07 MED ORDER — FUROSEMIDE 20 MG PO TABS: 20.0000 mg | ORAL_TABLET | Freq: Every day | ORAL | Status: DC

## 2015-06-09 ENCOUNTER — Encounter: Payer: Self-pay | Admitting: Hematology

## 2015-06-09 ENCOUNTER — Ambulatory Visit (HOSPITAL_BASED_OUTPATIENT_CLINIC_OR_DEPARTMENT_OTHER): Payer: BLUE CROSS/BLUE SHIELD

## 2015-06-09 ENCOUNTER — Telehealth: Payer: Self-pay | Admitting: Hematology

## 2015-06-09 ENCOUNTER — Ambulatory Visit (HOSPITAL_BASED_OUTPATIENT_CLINIC_OR_DEPARTMENT_OTHER): Payer: BLUE CROSS/BLUE SHIELD | Admitting: Hematology

## 2015-06-09 VITALS — BP 134/68 | HR 66 | Temp 98.1°F | Resp 18 | Ht 62.0 in | Wt 241.1 lb

## 2015-06-09 DIAGNOSIS — D696 Thrombocytopenia, unspecified: Secondary | ICD-10-CM

## 2015-06-09 LAB — CBC WITH DIFFERENTIAL/PLATELET
BASO%: 0.3 % (ref 0.0–2.0)
Basophils Absolute: 0 10*3/uL (ref 0.0–0.1)
EOS%: 1.4 % (ref 0.0–7.0)
Eosinophils Absolute: 0.1 10*3/uL (ref 0.0–0.5)
HCT: 39.6 % (ref 34.8–46.6)
HGB: 12.9 g/dL (ref 11.6–15.9)
LYMPH%: 31.1 % (ref 14.0–49.7)
MCH: 30.6 pg (ref 25.1–34.0)
MCHC: 32.6 g/dL (ref 31.5–36.0)
MCV: 94.1 fL (ref 79.5–101.0)
MONO#: 0.6 10*3/uL (ref 0.1–0.9)
MONO%: 9.6 % (ref 0.0–14.0)
NEUT#: 3.6 10*3/uL (ref 1.5–6.5)
NEUT%: 57.6 % (ref 38.4–76.8)
Platelets: 136 10*3/uL — ABNORMAL LOW (ref 145–400)
RBC: 4.21 10*6/uL (ref 3.70–5.45)
RDW: 14.7 % — ABNORMAL HIGH (ref 11.2–14.5)
WBC: 6.3 10*3/uL (ref 3.9–10.3)
lymph#: 2 10*3/uL (ref 0.9–3.3)
nRBC: 0 % (ref 0–0)

## 2015-06-09 LAB — MORPHOLOGY
PLT EST: DECREASED
RBC Comments: NORMAL

## 2015-06-09 NOTE — Progress Notes (Signed)
[facility]  Telephone:([phone]) [phone] Fax:([phone]) [place] Note   Patient Care Team: Binnie Rail, MD as PCP - General (Internal Medicine) Sable Feil, MD as Consulting Physician (Gastroenterology) Larey Dresser, MD as Consulting Physician (Cardiology) Izora Gala, MD as Consulting Physician (Otolaryngology) Roselyn Bering, PA-C as Physician Assistant (Orthopedic Surgery) Juanda Chance, NP as Nurse Practitioner (Obstetrics and Gynecology) Franchot Gallo, MD as Consulting Physician (Urology) Aletta Edouard, MD as Consulting Physician (Radiology) Sharol Roussel, [place] (Optometry) Pieter Partridge, DO (Neurology) Charlett Blake, MD (Physical Medicine and Rehabilitation) 06/09/2015  REFERRAL PHYSICIAN: Binnie Rail, MD  CHIEF COMPLAINTS/PURPOSE OF CONSULTATION:  Thrombocytopenia   HISTORY OF PRESENTING ILLNESS:  Janet Le 59 y.o. female with past medical history of hypertension, irritable bowel syndrome, renal cell carcinoma, is here because of chronic mild thrombocytopenia.   She was found to have a platelet count 143 on her recent routine lab test by her gynecologist. She is not aware if she had low platelet count before. According to her lab test result in our EPIC system, she has had intermittent mild thrombocytopenia for the past 6 months, with platelet in the range of 120-150K.  She denied any bleeding episodes including hematochezia, melana, hemoptysis, hematuria or epitaxis. No mucosal bleeding or easy bruising. She reports occasional bruise on her arms.  She has chronic constipation from her irritable bowel syndrome. She is on Linzess. She also has chronic acid reflux, was on Nexium before, and has been on Protonix for the past several years. She denies any new medications recently. She feels well in general, has mild fatigue, able to function very well, has intermittent low back pain and knee pain after long distance walking. She  otherwise denies any other significant pain, dyspnea, fever, chills, night sweats, or other symptoms.   MEDICAL HISTORY:  Past Medical History  Diagnosis Date  . GERD (gastroesophageal reflux disease)   . Hypertension   . Chronic constipation   . Hiatal hernia   . Endometriosis   . Syncopal episodes     since childhood  . Chronic sinusitis   . Osteoarthritis   . Hyperlipidemia   . Allergic rhinitis, cause unspecified   . Irritable bowel syndrome   . Hemorrhoids   . Esophageal stricture   . Hiatal hernia   . Renal cell cancer ([place]) 11/2010 dx    R, s/p cryoablation 02/16/11  . Personal history of diabetes mellitus     controlled with diet only    SURGICAL HISTORY: Past Surgical History  Procedure Laterality Date  . Breast surgery      bilateral  . Tubal ligation    . Laser ablation of the cervix    . Functional endoscopic sinus surgery    . Fallopian tube removed    . Renal cryoablation  02/16/11    R kidney due to [place] (IR procedure)    SOCIAL HISTORY: Social History   Social History  . Marital Status: Married    Spouse Name: N/A  . Number of Children: N/A  . Years of Education: N/A   Occupational History  . 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 Smoking status: Never Smoker   . Smokeless tobacco: Never Used  . Alcohol Use: No     Comment: rare  . Drug Use: No  . Sexual Activity: No   Other Topics Concern  . Not on file  Social History Narrative    FAMILY HISTORY: Family History  Problem Relation Age of Onset  . Diabetes Mother   . Kidney disease Father   . Prostate cancer Father   . Colitis Brother   . Leukemia Brother   . Multiple sclerosis Sister   . Colon polyps    . Colon cancer Neg Hx     ALLERGIES:  is allergic to sertraline hcl; ace inhibitors; codeine; darifenacin hydrobromide er; gabapentin; pravastatin; propoxyphene hcl; and wellbutrin.  MEDICATIONS:  Current Outpatient  Prescriptions  Medication Sig Dispense Refill  . ACCU-CHEK SOFTCLIX LANCETS lancets by Other route. Use twice a day for blood sugar     . alendronate (FOSAMAX) 70 MG tablet Take 1 tablet (70 mg total) by mouth once a week. Take with a full glass of water on an empty stomach. 12 tablet 3  . atorvastatin (LIPITOR) 10 MG tablet Take 1 tablet (10 mg total) by mouth daily. 90 tablet 3  . calcium-vitamin D (OSCAL WITH D) 500-200 MG-UNIT tablet Take 1 tablet by mouth.    . Diclofenac Sodium 2 % SOLN Apply 1 pump twice daily. 112 g 3  . fluticasone (FLONASE) 50 MCG/ACT nasal spray Place 1 spray into both nostrils daily. 16 g 2  . furosemide (LASIX) 20 MG tablet Take 1 tablet (20 mg total) by mouth daily. 90 tablet 1  . glucose blood (ONETOUCH VERIO) test strip Use one strip to check blood sugar daily 100 each 4  . Linaclotide (LINZESS) 145 MCG CAPS capsule Take 1 capsule (145 mcg total) by mouth daily. 90 capsule 1  . losartan (COZAAR) 100 MG tablet Take 1 tablet (100 mg total) by mouth daily. 90 tablet 4  . Multiple Vitamins-Minerals (HAIR/SKIN/NAILS PO) Take 1 tablet by mouth 3 (three) times daily.    . naproxen (NAPROSYN) 500 MG tablet Take 1 tablet (500 mg total) by mouth 2 (two) times daily with a meal. 60 tablet 0  . ONETOUCH DELICA LANCETS 99991111 MISC 1 each by Does not apply route daily. Use to help check blood sugars once daily Dx 250.00 100 each 3  . pantoprazole (PROTONIX) 40 MG tablet Take 1 tablet (40 mg total) by mouth daily. 90 tablet 3  . propranolol (INDERAL) 80 MG tablet Take 1 tablet (80 mg total) by mouth 2 (two) times daily. 60 tablet 5  . traMADol-acetaminophen (ULTRACET) 37.5-325 MG per tablet Take 1 tablet by mouth every 8 (eight) hours as needed. 90 tablet 1   No current facility-administered medications for this visit.    REVIEW OF SYSTEMS:   Constitutional: Denies fevers, chills or abnormal night sweats Eyes: Denies blurriness of vision, double vision or watery eyes Ears,  nose, mouth, throat, and face: Denies mucositis or sore throat Respiratory: Denies cough, dyspnea or wheezes Cardiovascular: Denies palpitation, chest discomfort or lower extremity swelling Gastrointestinal:  Denies nausea, heartburn or change in bowel habits Skin: Denies abnormal skin rashes Lymphatics: Denies new lymphadenopathy or easy bruising Neurological:Denies numbness, tingling or new weaknesses Behavioral/Psych: Mood is stable, no new changes  All other systems were reviewed with the patient and are negative.  PHYSICAL EXAMINATION: ECOG PERFORMANCE STATUS: 0 - Asymptomatic  Filed Vitals:   06/09/15 1124  BP: 134/68  Pulse: 66  Temp: 98.1 F (36.7 C)  Resp: 18   Filed Weights   06/09/15 1124  Weight: 241 lb 1.6 oz (109.362 kg)    GENERAL:alert, no distress and comfortable SKIN: skin color, texture, turgor are normal, no rashes or  significant lesions, no petechia or ecchymosis EYES: normal, conjunctiva are pink and non-injected, sclera clear OROPHARYNX:no exudate, no erythema and lips, buccal mucosa, and tongue normal  NECK: supple, thyroid normal size, non-tender, without nodularity LYMPH:  no palpable lymphadenopathy in the cervical, axillary or inguinal LUNGS: clear to auscultation and percussion with normal breathing effort HEART: regular rate & rhythm and no murmurs and no lower extremity edema ABDOMEN:abdomen soft, non-tender and normal bowel sounds Musculoskeletal:no cyanosis of digits and no clubbing  PSYCH: alert & oriented x 3 with fluent speech NEURO: no focal motor/sensory deficits  LABORATORY DATA:  I have reviewed the data as listed Lab Results  Component Value Date   WBC 6.3 06/09/2015   HGB 12.9 06/09/2015   HCT 39.6 06/09/2015   MCV 94.1 06/09/2015   PLT 136 Platelet count consistent in citrate* 06/09/2015    Recent Labs  08/10/14 05/25/15 1508 05/30/15 1055  NA 142  --  141  K 4.1  --  4.2  CL  --   --  107  CO2  --   --  29  GLUCOSE   --   --  94  BUN 16 14 14   CREATININE 0.9 0.89 0.92  CALCIUM  --   --  9.9  GFRNONAA  --  71  --   GFRAA  --  82  --   PROT  --   --  7.4  ALBUMIN  --   --  3.8  AST 34  --  17  ALT 16  --  10  ALKPHOS 73  --  88  BILITOT  --   --  0.5    RADIOGRAPHIC STUDIES: I have personally reviewed the radiological images as listed and agreed with the findings in the report. Ct Abd Wo & W Cm  06/01/2015  CLINICAL DATA:  Status post cryoablation of right lower renal mass on 02/27/2011, presenting for annual surveillance. EXAM: CT ABDOMEN WITHOUT AND WITH CONTRAST TECHNIQUE: Multidetector CT imaging of the abdomen was performed following the standard protocol before and following the bolus administration of intravenous contrast. CONTRAST:  118mL OMNIPAQUE IOHEXOL 300 MG/ML  SOLN COMPARISON:  06/10/2014 CT abdomen. FINDINGS: Lower chest: No significant pulmonary nodules or acute consolidative airspace disease. Hepatobiliary: Normal liver with no liver mass. Normal gallbladder with no radiopaque cholelithiasis. No biliary ductal dilatation. Pancreas: Normal, with no mass or duct dilation. Spleen: Normal size spleen. A 0.7 cm progressively enhancing lesion in the superior spleen is stable since 12/13/2010 MRI, in keeping with a benign splenic hemangioma. No new splenic lesions. Adrenals/Urinary Tract: Normal adrenals. A subcentimeter hypodense lesion in the anterior upper right kidney is stable since 12/13/2010, where it was characterized as a simple renal cyst. No hydronephrosis. No nephrolithiasis. There is a stable appearance of the posterior right lower kidney at the cryoablation site, with focal renal cortical scarring and no evidence of an enhancing cystic or solid mass to suggest local tumor recurrence. No new renal cortical lesions. Stomach/Bowel: Grossly normal stomach. Visualized small and large bowel is normal caliber, with no bowel wall thickening. Vascular/Lymphatic: Atherosclerotic nonaneurysmal  abdominal aorta. Patent portal, splenic, hepatic and renal veins. No pathologically enlarged lymph nodes in the abdomen. Other: No pneumoperitoneum, ascites or focal fluid collection. Musculoskeletal: No aggressive appearing focal osseous lesions. IMPRESSION: 1. Stable appearance of the cryoablation site in the posterior right lower kidney, with no evidence of local tumor recurrence. 2. No evidence of metastatic disease in the abdomen. Electronically Signed   By:  Ilona Sorrel M.D.   On: 06/01/2015 11:06    ASSESSMENT & PLAN: 59 year old African-American female, with past medical history of hypertension, irritable bowel syndrome, GERD, history of renal cell carcinoma status post cryoablation, presented with chronic mild thrombocytopenia or more than 6 years.  1. Thrombocytopenia -Her CBC showed platelet 120-150K in the past 6 years, normal WBC and hemoglobin, repeated CBC showed plt 136K today.  -I discussed the common etiology for chronic mild thrombocytopenia, which include autoimmune related (ITP), liver disease, splenomegaly, medication or alcohol induced, chronic infection such as hepatitis C and HIV, and bone marrow disease such as MDS. The other etiology such as infection, malignancy, microangiopathy, DIC a less likely given the indolent course and his clinical presentation. -I reviewed her medication list and suggest him to stop protonix,  which can induce thrombocytopenia.  -She does not have history of hepatitis, liver functions normal, recent CT of abdomen showed normal appearance of liver and spleen. 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 Given her mild thrombocytopenia, she does not need any treatment for now, but her blood counts need to be monitored -We discussed the risk of bleeding from thrombocytopenia. Giving the mild degree of  thrombocytopenia, the risk of bleeding is pretty low. But she knows to avoid injury.   2. history of RCC, status post cryoablation -her recent CT scan showed no evidence of recurrence -She follows up with interventional radiologist Dr. Kathlene Cote  3. Hypertension, IBS,  -She will continue follow-up with her primary care physician and GI   Plan -Lab today -stop protonix, repeating CBC one months. I told her to use Pepcid or Zantac for acid reflux -repeat CBC every 3 month and I will see her back in 6 month for follow up.     Orders Placed This Encounter  Procedures  . Morphology    Standing Status: Future     Number of Occurrences: 1     Standing Expiration Date: 06/08/2016  . Platelet by Citrate    Standing Status: Future     Number of Occurrences: 1     Standing Expiration Date: 06/08/2016  . Methylmalonic acid, serum    Standing Status: Future     Number of Occurrences: 1     Standing Expiration Date: 06/08/2016  . ANA    Standing Status: Future     Number of Occurrences: 1     Standing Expiration Date: 06/08/2016  . Sedimentation rate    Standing Status: Future     Number of Occurrences: 1     Standing Expiration Date: 06/08/2016  . CBC with Differential    Starting in Jan 2017    Standing Status: Standing     Number of Occurrences: 20     Standing Expiration Date: 06/08/2016    All questions were answered. The patient knows to call the clinic with any problems, questions or concerns. I spent 30 minutes counseling the patient face to face. The total time spent in the appointment was 40 minutes and more than 50% was on counseling.     Truitt Merle, MD 06/09/2015 9:49 PM

## 2015-06-09 NOTE — Telephone Encounter (Signed)
Gave and printed appt sched and avs for pt for Jan [facility] and [place]

## 2015-06-10 DIAGNOSIS — N2889 Other specified disorders of kidney and ureter: Secondary | ICD-10-CM | POA: Insufficient documentation

## 2015-06-10 NOTE — Progress Notes (Signed)
Chief Complaint: Status post cryoablation of a right renal carcinoma on 02/16/11.  History of Present Illness: Janet Le is a 59 y.o. female status post right renal cryoablation 4 years ago.  She has been doing well and has been referred to Dr. Burr Medico for evaluation of thrombocytopenia.  She is asymptomatic.  Past Medical History  Diagnosis Date  . GERD (gastroesophageal reflux disease)   . Hypertension   . Chronic constipation   . Hiatal hernia   . Endometriosis   . Syncopal episodes     since childhood  . Chronic sinusitis   . Osteoarthritis   . Hyperlipidemia   . Allergic rhinitis, cause unspecified   . Irritable bowel syndrome   . Hemorrhoids   . Esophageal stricture   . Hiatal hernia   . Renal cell cancer ([place]) 11/2010 dx    R, s/p cryoablation 02/16/11  . Personal history of diabetes mellitus     controlled with diet only    Past Surgical History  Procedure Laterality Date  . Breast surgery      bilateral  . Tubal ligation    . Laser ablation of the cervix    . Functional endoscopic sinus surgery    . Fallopian tube removed    . Renal cryoablation  02/16/11    R kidney due to [place] (IR procedure)    Allergies: Sertraline hcl; Ace inhibitors; Codeine; Darifenacin hydrobromide er; Gabapentin; Pravastatin; Propoxyphene hcl; and Wellbutrin  Medications: Prior to Admission medications   Medication Sig Start Date End Date Taking? Authorizing Provider  ACCU-CHEK SOFTCLIX LANCETS lancets by Other route. Use twice a day for blood sugar    Yes Historical Provider, MD  alendronate (FOSAMAX) 70 MG tablet Take 1 tablet (70 mg total) by mouth once a week. Take with a full glass of water on an empty stomach. 05/31/15  Yes Binnie Rail, MD  atorvastatin (LIPITOR) 10 MG tablet Take 1 tablet (10 mg total) by mouth daily. 05/31/15  Yes Binnie Rail, MD  calcium-vitamin D (OSCAL WITH D) 500-200 MG-UNIT tablet Take 1 tablet by mouth.   Yes Historical Provider, MD    Diclofenac Sodium 2 % SOLN Apply 1 pump twice daily. 02/02/15  Yes Lyndal Pulley, DO  fluticasone (FLONASE) 50 MCG/ACT nasal spray Place 1 spray into both nostrils daily. 01/05/15  Yes Rowe Clack, MD  glucose blood (ONETOUCH VERIO) test strip Use one strip to check blood sugar daily 05/31/15  Yes Binnie Rail, MD  Linaclotide [facility]) 145 MCG CAPS capsule Take 1 capsule (145 mcg total) by mouth daily. 05/31/15  Yes Jerene Bears, MD  losartan (COZAAR) 100 MG tablet Take 1 tablet (100 mg total) by mouth daily. 05/31/15  Yes Binnie Rail, MD  naproxen (NAPROSYN) 500 MG tablet Take 1 tablet (500 mg total) by mouth 2 (two) times daily with a meal. 09/08/14  Yes Golden Circle, FNP  [facility] DELICA LANCETS 99991111 MISC 1 each by Does not apply route daily. Use to help check blood sugars once daily Dx 250.00 11/23/13  Yes Rowe Clack, MD  pantoprazole (PROTONIX) 40 MG tablet Take 1 tablet (40 mg total) by mouth daily. 05/31/15  Yes Binnie Rail, MD  propranolol (INDERAL) 80 MG tablet Take 1 tablet (80 mg total) by mouth 2 (two) times daily. 05/31/15  Yes Binnie Rail, MD  traMADol-acetaminophen (ULTRACET) 37.5-325 MG per tablet Take 1 tablet by mouth every 8 (eight) hours as needed.  12/18/13  Yes Charlett Blake, MD  furosemide (LASIX) 20 MG tablet Take 1 tablet (20 mg total) by mouth daily. 06/07/15   Binnie Rail, MD  Multiple Vitamins-Minerals (HAIR/SKIN/NAILS PO) Take 1 tablet by mouth 3 (three) times daily.    Historical Provider, MD     Family History  Problem Relation Age of Onset  . Diabetes Mother   . Kidney disease Father   . Prostate cancer Father   . Colitis Brother   . Leukemia Brother   . Multiple sclerosis Sister   . Colon polyps    . Colon cancer Neg Hx     Social History   Social History  . Marital Status: Married    Spouse Name: N/A  . Number of Children: N/A  . Years of Education: N/A   Occupational History  . 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 Smoking status: Never Smoker   . Smokeless tobacco: Never Used  . Alcohol Use: No     Comment: rare  . Drug Use: No  . Sexual Activity: No   Other Topics Concern  . None   Social History Narrative    ECOG Status: 0 - Asymptomatic  Review of Systems: A 12 point ROS discussed and pertinent positives are indicated in the HPI above.  All other systems are negative.  Review of Systems  Constitutional: Negative.   Respiratory: Negative.   Cardiovascular: Negative.   Gastrointestinal: Negative.   Genitourinary: Negative.   Musculoskeletal: Negative.   Neurological: Negative.      Vital Signs: BP 159/90 mmHg  Pulse 58  Temp(Src) 97.8 F (36.6 C)  Resp 18  SpO2 100%  Physical Exam  Constitutional: She is oriented to person, place, and time. She appears well-developed and well-nourished. No distress.  Abdominal: Soft. She exhibits no distension. There is no tenderness. There is no rebound and no guarding.  Neurological: She is alert and oriented to person, place, and time.  Skin: She is not diaphoretic.    Imaging: Ct Abd Wo & W Cm  06/01/2015  CLINICAL DATA:  Status post cryoablation of right lower renal mass on 02/27/2011, presenting for annual surveillance. EXAM: CT ABDOMEN WITHOUT AND WITH CONTRAST TECHNIQUE: Multidetector CT imaging of the abdomen was performed following the standard protocol before and following the bolus administration of intravenous contrast. CONTRAST:  17mL OMNIPAQUE IOHEXOL 300 MG/ML  SOLN COMPARISON:  06/10/2014 CT abdomen. FINDINGS: Lower chest: No significant pulmonary nodules or acute consolidative airspace disease. Hepatobiliary: Normal liver with no liver mass. Normal gallbladder with no radiopaque cholelithiasis. No biliary ductal dilatation. Pancreas: Normal, with no mass or duct dilation. Spleen: Normal size spleen. A 0.7 cm progressively enhancing lesion in the superior spleen is stable  since 12/13/2010 MRI, in keeping with a benign splenic hemangioma. No new splenic lesions. Adrenals/Urinary Tract: Normal adrenals. A subcentimeter hypodense lesion in the anterior upper right kidney is stable since 12/13/2010, where it was characterized as a simple renal cyst. No hydronephrosis. No nephrolithiasis. There is a stable appearance of the posterior right lower kidney at the cryoablation site, with focal renal cortical scarring and no evidence of an enhancing cystic or solid mass to suggest local tumor recurrence. No new renal cortical lesions. Stomach/Bowel: Grossly normal stomach. Visualized small and large bowel is normal caliber, with no bowel wall thickening. Vascular/Lymphatic: Atherosclerotic nonaneurysmal abdominal aorta. Patent portal, splenic, hepatic and renal veins. No pathologically enlarged lymph  nodes in the abdomen. Other: No pneumoperitoneum, ascites or focal fluid collection. Musculoskeletal: No aggressive appearing focal osseous lesions. IMPRESSION: 1. Stable appearance of the cryoablation site in the posterior right lower kidney, with no evidence of local tumor recurrence. 2. No evidence of metastatic disease in the abdomen. Electronically Signed   By: Ilona Sorrel M.D.   On: 06/01/2015 11:06    Labs:  CBC:  Recent Labs  08/10/14 05/30/15 1055 06/09/15 1300  WBC 6.2 7.0 6.3  HGB 12.2 12.6 12.9  HCT  --  38.8 39.6  PLT 124* 127.0* 136 Platelet count consistent in citrate*    COAGS: No results for input(s): INR, APTT in the last 8760 hours.  BMP:  Recent Labs  08/10/14 05/25/15 1508 05/30/15 1055  NA 142  --  141  K 4.1  --  4.2  CL  --   --  107  CO2  --   --  29  GLUCOSE  --   --  94  BUN 16 14 14   CALCIUM  --   --  9.9  CREATININE 0.9 0.89 0.92  GFRNONAA  --  71  --   GFRAA  --  82  --     LIVER FUNCTION TESTS:  Recent Labs  08/10/14 05/30/15 1055  BILITOT  --  0.5  AST 34 17  ALT 16 10  ALKPHOS 73 88  PROT  --  7.4  ALBUMIN  --  3.8     TUMOR MARKERS: No results for input(s): AFPTM, CEA, CA199, CHROMGRNA in the last 8760 hours.  Assessment and Plan:  Follow up CT today was reviewed with Janet Le and shows no evidence of right renal tumor recurrence 4 years post ablation.  Renal function is stable and normal.  I told Janet Le that we could stop routine imaging follow up this year, but she would feel more comfortable with one additional follow up scan next year to provide 5 year follow up post ablation.  I will schedule a follow up CT next December and meet with her afterward.  SignedAletta Edouard T 06/10/2015, 9:18 AM   I spent a total of 15 Minutes in face to face in clinical consultation, greater than 50% of which was counseling/coordinating care post right renal ablation.

## 2015-06-12 LAB — SEDIMENTATION RATE: Sed Rate: 21 mm/hr (ref 0–30)

## 2015-06-12 LAB — ANA: Anti Nuclear Antibody(ANA): NEGATIVE

## 2015-06-12 LAB — METHYLMALONIC ACID, SERUM: Methylmalonic Acid, Quant: 432 nmol/L — ABNORMAL HIGH (ref 87–318)

## 2015-06-12 NOTE — Addendum Note (Signed)
Addended by: Truitt Merle on: 06/12/2015 09:23 PM   Modules accepted: Orders

## 2015-06-13 ENCOUNTER — Other Ambulatory Visit: Payer: Self-pay | Admitting: *Deleted

## 2015-06-13 MED ORDER — DICLOFENAC SODIUM 2 % TD SOLN: TRANSDERMAL | Status: DC

## 2015-06-13 NOTE — Telephone Encounter (Signed)
Refill done.  

## 2015-06-14 ENCOUNTER — Telehealth: Payer: Self-pay | Admitting: Hematology

## 2015-06-14 NOTE — Telephone Encounter (Signed)
I called pt and reviewed her lab results from last week. I recommend her to take prenatal MVI which contains folic acid and [id number], once daily. She agrees.  Truitt Merle 06/14/2015

## 2015-06-22 ENCOUNTER — Ambulatory Visit: Payer: BLUE CROSS/BLUE SHIELD | Admitting: Internal Medicine

## 2015-06-28 ENCOUNTER — Telehealth: Payer: Self-pay | Admitting: *Deleted

## 2015-06-28 NOTE — Telephone Encounter (Signed)
Pt called requesting Dr Burr Medico to send a prescription to [facility] for the prenatal vitamin. States her insurance will cover "PNV" . Pt is asking if she can get a B-12 injection instead of taking pill.  Will forward to Dr Burr Medico and her nurse.

## 2015-06-29 ENCOUNTER — Other Ambulatory Visit: Payer: Self-pay | Admitting: *Deleted

## 2015-06-29 ENCOUNTER — Other Ambulatory Visit: Payer: Self-pay | Admitting: Hematology

## 2015-06-29 DIAGNOSIS — D696 Thrombocytopenia, unspecified: Secondary | ICD-10-CM

## 2015-06-29 MED ORDER — PRENATAL MULTIVITAMIN CH: 1.0000 | ORAL_TABLET | Freq: Every day | ORAL | Status: DC

## 2015-06-29 NOTE — Telephone Encounter (Signed)
Thu,  Could you phone in prenatal MVI for her. She will have her B12 level checked on next visit, then we will decide if she needs B12 injections. Please let pt know, thanks  Truitt Merle  06/29/2015

## 2015-06-30 NOTE — Telephone Encounter (Signed)
Called pt today and left message on voice mail re:  Nurse had faxed script for prenatal MVI to [facility] today.  Informed pt that B12 level will be checked at next office visit;  Dr. Burr Medico will decide if pt needs B12 injections.

## 2015-07-05 ENCOUNTER — Telehealth: Payer: Self-pay | Admitting: *Deleted

## 2015-07-05 NOTE — Telephone Encounter (Signed)
"  I missed a call and need to talk to Dr. Ernestina Penna nurse.  Transferred call [phone].  Did notwish to leave message stating I do not know who called.  This nurse provided next lab appointment information as phone tree may have called.

## 2015-07-07 ENCOUNTER — Other Ambulatory Visit (HOSPITAL_BASED_OUTPATIENT_CLINIC_OR_DEPARTMENT_OTHER): Payer: BLUE CROSS/BLUE SHIELD

## 2015-07-07 DIAGNOSIS — D696 Thrombocytopenia, unspecified: Secondary | ICD-10-CM | POA: Diagnosis not present

## 2015-07-07 LAB — CBC WITH DIFFERENTIAL/PLATELET
BASO%: 0.3 % (ref 0.0–2.0)
Basophils Absolute: 0 10*3/uL (ref 0.0–0.1)
EOS%: 1.6 % (ref 0.0–7.0)
Eosinophils Absolute: 0.1 10*3/uL (ref 0.0–0.5)
HCT: 39.4 % (ref 34.8–46.6)
HGB: 12.7 g/dL (ref 11.6–15.9)
LYMPH%: 30.9 % (ref 14.0–49.7)
MCH: 30.5 pg (ref 25.1–34.0)
MCHC: 32.2 g/dL (ref 31.5–36.0)
MCV: 94.5 fL (ref 79.5–101.0)
MONO#: 0.3 10*3/uL (ref 0.1–0.9)
MONO%: 4.6 % (ref 0.0–14.0)
NEUT#: 4 10*3/uL (ref 1.5–6.5)
NEUT%: 62.6 % (ref 38.4–76.8)
Platelets: 123 10*3/uL — ABNORMAL LOW (ref 145–400)
RBC: 4.17 10*6/uL (ref 3.70–5.45)
RDW: 14.9 % — ABNORMAL HIGH (ref 11.2–14.5)
WBC: 6.3 10*3/uL (ref 3.9–10.3)
lymph#: 2 10*3/uL (ref 0.9–3.3)

## 2015-07-08 LAB — FOLATE RBC
Folate, Hemolysate: 325.1 ng/mL
Folate, RBC: 836 ng/mL (ref >498)
Hematocrit: 38.9 % (ref 34.0–46.6)

## 2015-07-08 LAB — VITAMIN B12: Vitamin B12: 530 pg/mL (ref 211–946)

## 2015-07-18 ENCOUNTER — Telehealth: Payer: Self-pay | Admitting: Internal Medicine

## 2015-07-18 NOTE — Telephone Encounter (Signed)
Pt is going to Urgent Care. 

## 2015-07-18 NOTE — Telephone Encounter (Signed)
Patient Name: Janet Le  DOB: [birth year]    Initial Comment Caller states she woke with headache, blood pressure 160/90 most recent   Nurse Assessment  Nurse: Leilani Merl, RN, Heather Date/Time (Eastern Time): 07/18/2015 11:40:02 AM  Confirm and document reason for call. If symptomatic, describe symptoms. You must click the next button to save text entered. ---Caller states she woke with headache, blood pressure 160/90 most recent  Has the patient traveled out of the country within the last 30 days? ---Not Applicable  Does the patient have any new or worsening symptoms? ---Yes  Will a triage be completed? ---Yes  Related visit to physician within the last 2 weeks? ---No  Does the PT have any chronic conditions? (i.e. diabetes, asthma, etc.) ---Yes  List chronic conditions. ---see MR  Is this a behavioral health or substance abuse call? ---No     Guidelines    Guideline Title Affirmed Question Affirmed Notes  Headache [1] SEVERE headache (e.g., excruciating) AND [2] not improved after 2 hours of pain medicine    Final Disposition User   See Physician within 4 Hours (or PCP triage) Leilani Merl, RN, Heather    Referrals  [facility]   Disagree/Comply: Comply

## 2015-07-19 ENCOUNTER — Ambulatory Visit (INDEPENDENT_AMBULATORY_CARE_PROVIDER_SITE_OTHER): Payer: BLUE CROSS/BLUE SHIELD | Admitting: Internal Medicine

## 2015-07-19 VITALS — BP 138/80 | HR 66 | Temp 98.6°F | Resp 16 | Wt 252.0 lb

## 2015-07-19 DIAGNOSIS — I10 Essential (primary) hypertension: Secondary | ICD-10-CM | POA: Diagnosis not present

## 2015-07-19 DIAGNOSIS — Z23 Encounter for immunization: Secondary | ICD-10-CM | POA: Diagnosis not present

## 2015-07-19 DIAGNOSIS — R51 Headache: Secondary | ICD-10-CM | POA: Diagnosis not present

## 2015-07-19 DIAGNOSIS — R519 Headache, unspecified: Secondary | ICD-10-CM

## 2015-07-19 MED ORDER — VALSARTAN 320 MG PO TABS: 320.0000 mg | ORAL_TABLET | Freq: Every day | ORAL | Status: DC

## 2015-07-19 NOTE — Patient Instructions (Addendum)
  Flu vaccine administered today.   Medications reviewed and updated.  Changes include discontinuing the losartan and start valsartan (diovan) 320 mg daily.  Continue all of your other medications as prescribed.    Your prescription(s) have been submitted to your pharmacy. Please take as directed and contact our office if you believe you are having problem(s) with the medication(s).

## 2015-07-19 NOTE — Progress Notes (Signed)
Pre visit review using our clinic review tool, if applicable. No additional management support is needed unless otherwise documented below in the visit note. 

## 2015-07-19 NOTE — Progress Notes (Signed)
Subjective:    Patient ID: Janet Le, female    DOB: 02-09-1956, 60 y.o.   MRN: [id number]  HPI Her father-in-law died recently and she has a lot of stress.  Headache: she woke u p with aheadache yesterday and it did not go away. Laying down made it worse. Naprosyn helped a little, but it was still there.  The headache was located over the left eye and the eyeball hurt a little.  She has a history of migraines and she does not remember what they were like.  Today she has a mild headache, but not like yesterday.  Her daughter checked her BP last night and it was 160/90.    Hypertension: She is taking her medication daily. She is compliant with a low sodium diet.  She does not monitor her blood pressure at home - she just ordered a new BP cuff and can start checking it.  It was recently 160/90 and is concerned if it is controlled or not.     Medications and allergies reviewed with patient and updated if appropriate.  Patient Active Problem List   Diagnosis Date Noted  . Right renal mass   . Thrombocytopenia ([place]) 05/30/2015  . Arthritis of left lower extremity 02/02/2015  . Left knee pain 09/08/2014  . Gout 12/11/2013  . Vision disturbance 12/08/2013  . Severe obesity (BMI >= 40) ([place]) 11/06/2013  . Osteoporosis   . Cough 07/15/2012  . Left lumbar radiculopathy 04/13/2012  . Renal cell cancer ([place])   . External hemorrhoid 11/14/2010  . IBS (irritable bowel syndrome) 11/14/2010  . GERD (gastroesophageal reflux disease) 11/14/2010  . Urinary frequency 08/18/2010  . HYPERCHOLESTEROLEMIA, MILD 03/16/2010  . Lymphedema 03/15/2010  . Obesity 10/03/2009  . ALLERGIC RHINITIS 08/31/2009  . GLOBUS HYSTERICUS 08/26/2009  . MIGRAINE HEADACHE 06/14/2009  . SINUSITIS, CHRONIC 06/14/2009  . Headache(784.0) 06/14/2009  . Personal history of diabetes mellitus 06/14/2009  . SYNCOPE 05/16/2009  . Essential hypertension 05/06/2009  . CONSTIPATION, CHRONIC 05/06/2009  . ENDOMETRIOSIS  05/06/2009    Current Outpatient Prescriptions on File Prior to Visit  Medication Sig Dispense Refill  . ACCU-CHEK SOFTCLIX LANCETS lancets by Other route. Use twice a day for blood sugar     . alendronate (FOSAMAX) 70 MG tablet Take 1 tablet (70 mg total) by mouth once a week. Take with a full glass of water on an empty stomach. 12 tablet 3  . atorvastatin (LIPITOR) 10 MG tablet Take 1 tablet (10 mg total) by mouth daily. 90 tablet 3  . calcium-vitamin D (OSCAL WITH D) 500-200 MG-UNIT tablet Take 1 tablet by mouth.    . Diclofenac Sodium 2 % SOLN Apply 1 pump twice daily. 112 g 3  . fluticasone (FLONASE) 50 MCG/ACT nasal spray Place 1 spray into both nostrils daily. 16 g 2  . furosemide (LASIX) 20 MG tablet Take 1 tablet (20 mg total) by mouth daily. 90 tablet 1  . glucose blood (ONETOUCH VERIO) test strip Use one strip to check blood sugar daily 100 each 4  . Linaclotide (LINZESS) 145 MCG CAPS capsule Take 1 capsule (145 mcg total) by mouth daily. 90 capsule 1  . losartan (COZAAR) 100 MG tablet Take 1 tablet (100 mg total) by mouth daily. 90 tablet 4  . naproxen (NAPROSYN) 500 MG tablet Take 1 tablet (500 mg total) by mouth 2 (two) times daily with a meal. 60 tablet 0  . ONETOUCH DELICA LANCETS 99991111 MISC 1 each by Does not  apply route daily. Use to help check blood sugars once daily Dx 250.00 100 each 3  . pantoprazole (PROTONIX) 40 MG tablet Take 1 tablet (40 mg total) by mouth daily. 90 tablet 3  . Prenatal Vit-Fe Fumarate-FA (PRENATAL MULTIVITAMIN) TABS tablet Take 1 tablet by mouth daily at 12 noon. 60 tablet 1  . propranolol (INDERAL) 80 MG tablet Take 1 tablet (80 mg total) by mouth 2 (two) times daily. 60 tablet 5  . traMADol-acetaminophen (ULTRACET) 37.5-325 MG per tablet Take 1 tablet by mouth every 8 (eight) hours as needed. 90 tablet 1   No current facility-administered medications on file prior to visit.    Past Medical History  Diagnosis Date  . GERD (gastroesophageal reflux  disease)   . Hypertension   . Chronic constipation   . Hiatal hernia   . Endometriosis   . Syncopal episodes     since childhood  . Chronic sinusitis   . Osteoarthritis   . Hyperlipidemia   . Allergic rhinitis, cause unspecified   . Irritable bowel syndrome   . Hemorrhoids   . Esophageal stricture   . Hiatal hernia   . Renal cell cancer ([place]) 11/2010 dx    R, s/p cryoablation 02/16/11  . Personal history of diabetes mellitus     controlled with diet only    Past Surgical History  Procedure Laterality Date  . Breast surgery      bilateral  . Tubal ligation    . Laser ablation of the cervix    . Functional endoscopic sinus surgery    . Fallopian tube removed    . Renal cryoablation  02/16/11    R kidney due to [place] (IR procedure)    Social History   Social History  . Marital Status: Married    Spouse Name: N/A  . Number of Children: N/A  . Years of Education: N/A   Occupational History  . 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 Smoking status: Never Smoker   . Smokeless tobacco: Never Used  . Alcohol Use: No     Comment: rare  . Drug Use: No  . Sexual Activity: No   Other Topics Concern  . Not on file   Social History Narrative    Family History  Problem Relation Age of Onset  . Diabetes Mother   . Kidney disease Father   . Prostate cancer Father   . Colitis Brother   . Leukemia Brother   . Multiple sclerosis Sister   . Colon polyps    . Colon cancer Neg Hx     Review of Systems  Constitutional: Negative for fever.  Respiratory: Negative for cough, shortness of breath and wheezing.   Cardiovascular: Positive for leg swelling (chronic). Negative for chest pain and palpitations.  Neurological: Positive for numbness (in arms intermittently - not new) and headaches. Negative for dizziness, weakness and light-headedness.       Objective:   Filed Vitals:   07/19/15 1421  BP: 138/80  Pulse: 66    Temp: 98.6 F (37 C)  Resp: 16   Filed Weights   07/19/15 1421  Weight: 252 lb (114.306 kg)   Body mass index is 46.08 kg/(m^2).   Physical Exam Constitutional: Appears well-developed and well-nourished. No distress.  Neck: Neck supple. No tracheal deviation present. No thyromegaly present.  No carotid bruit. No cervical adenopathy.   Cardiovascular: Normal rate, regular rhythm and normal  heart sounds.   No murmur heard.  No edema Pulmonary/Chest: Effort normal and breath sounds normal. No respiratory distress. No wheezes.         Assessment & Plan:   See Problem List for Assessment and Plan of chronic medical problems.

## 2015-07-20 ENCOUNTER — Encounter: Payer: Self-pay | Admitting: Internal Medicine

## 2015-07-20 DIAGNOSIS — R519 Headache, unspecified: Secondary | ICD-10-CM | POA: Insufficient documentation

## 2015-07-20 DIAGNOSIS — R51 Headache: Secondary | ICD-10-CM

## 2015-07-20 NOTE — Assessment & Plan Note (Signed)
Likely related to stress, possibly elevated BP The headache is somewhat like a migraine which she does have a history of and was improved with otc pain medication.   It is less severe / minimal today Just monitor for now

## 2015-07-20 NOTE — Assessment & Plan Note (Signed)
BP not well controlled Will d/c losartan 100 mg daily Start valsartan 320 mg daily Continue propranolol 80 mg BID, Lasix 20 mg daily Continue low sodium diet Continue exercise Work on weight loss Monitor BP and update me on numbers via mychart  Keep already scheduled follow up appt, but return sooner if BP is not controlled

## 2015-08-09 ENCOUNTER — Other Ambulatory Visit: Payer: Self-pay | Admitting: Family

## 2015-08-09 ENCOUNTER — Telehealth: Payer: Self-pay

## 2015-08-09 ENCOUNTER — Ambulatory Visit: Payer: BLUE CROSS/BLUE SHIELD

## 2015-08-09 VITALS — BP 142/90

## 2015-08-09 DIAGNOSIS — I10 Essential (primary) hypertension: Secondary | ICD-10-CM

## 2015-08-09 MED ORDER — AMLODIPINE BESYLATE 10 MG PO TABS: 10.0000 mg | ORAL_TABLET | Freq: Every day | ORAL | Status: DC

## 2015-08-09 NOTE — Telephone Encounter (Signed)
Patient came in to office today stating that her bp reading this morning at home was 193/123, bp cuff is new machine---greg calone has added another bp med for patient to pick up at [facility]---patient also advised to bring (home)bp cuff to office sometime soon (nurse visit) and we can compare readings with manual and patients cuff to make sure her cuff is accurate---patient will start new bp med and continue taking 2 other bp meds she is currently on---patient advised to follow up if she does not see improvement with reading or has any symptoms llike sob, chest pain, dizziness, etc. Or call 911/go to ED if needed

## 2015-08-12 ENCOUNTER — Ambulatory Visit: Payer: BLUE CROSS/BLUE SHIELD

## 2015-08-12 VITALS — BP 122/96 | HR 72 | Ht 62.0 in

## 2015-08-12 DIAGNOSIS — I10 Essential (primary) hypertension: Secondary | ICD-10-CM

## 2015-08-12 NOTE — Progress Notes (Signed)
   Subjective:    Patient ID: Janet Le, female    DOB: 1956-04-25, 60 y.o.   MRN: [id number]  HPI    Review of Systems     Objective:   Physical Exam        Assessment & Plan:

## 2015-08-15 ENCOUNTER — Telehealth: Payer: Self-pay | Admitting: Internal Medicine

## 2015-08-15 NOTE — Telephone Encounter (Signed)
Started valsartan 320 mg daily at her last visit (losartan was stopped) She was to continue propranolol 80 mg BID, Lasix 20 mg daily, amlodipine 10 mg daily

## 2015-08-15 NOTE — Telephone Encounter (Signed)
Can you please advise on which meds pt is to be taking?

## 2015-08-15 NOTE — Telephone Encounter (Signed)
Pt called in and she states she is taking 3 different bp medication and she is confused. Can you please give her a call

## 2015-08-16 MED ORDER — VALSARTAN 320 MG PO TABS: 320.0000 mg | ORAL_TABLET | Freq: Every day | ORAL | Status: DC

## 2015-08-16 NOTE — Telephone Encounter (Signed)
Spoke with pt to inform. Sent in refill for Valsartan

## 2015-08-22 ENCOUNTER — Other Ambulatory Visit: Payer: Self-pay | Admitting: *Deleted

## 2015-08-22 MED ORDER — DICLOFENAC SODIUM 2 % TD SOLN: TRANSDERMAL | Status: DC

## 2015-08-22 NOTE — Telephone Encounter (Signed)
Refill done.  

## 2015-08-23 ENCOUNTER — Encounter: Payer: Self-pay | Admitting: Internal Medicine

## 2015-08-23 ENCOUNTER — Ambulatory Visit (INDEPENDENT_AMBULATORY_CARE_PROVIDER_SITE_OTHER): Payer: BLUE CROSS/BLUE SHIELD | Admitting: Internal Medicine

## 2015-08-23 VITALS — BP 104/66 | HR 61 | Temp 98.3°F | Resp 16 | Wt 244.0 lb

## 2015-08-23 DIAGNOSIS — I10 Essential (primary) hypertension: Secondary | ICD-10-CM | POA: Diagnosis not present

## 2015-08-23 MED ORDER — LIRAGLUTIDE -WEIGHT MANAGEMENT 18 MG/3ML ~~LOC~~ SOPN
0.6000 mg | PEN_INJECTOR | Freq: Every day | SUBCUTANEOUS | Status: DC
Start: 2015-08-23 — End: 2015-09-23

## 2015-08-23 MED ORDER — INSULIN PEN NEEDLE 32G X 5 MM MISC
Status: DC
Start: 2015-08-23 — End: 2016-05-30

## 2015-08-23 NOTE — Addendum Note (Signed)
Addended by: Binnie Rail on: 08/23/2015 02:40 PM   Modules accepted: Orders

## 2015-08-23 NOTE — Assessment & Plan Note (Signed)
Her blood pressure at home has been fairly consistent and controlled over the past week or so, but was elevated previously. It is higher than the measurements that we've gotten here, but both are normal range Continue current medications Work harder on increasing exercise and weight loss Stressed low-sodium diet

## 2015-08-23 NOTE — Progress Notes (Signed)
Pre visit review using our clinic review tool, if applicable. No additional management support is needed unless otherwise documented below in the visit note. 

## 2015-08-23 NOTE — Assessment & Plan Note (Addendum)
Stressed the importance of regular exercise-goal is for 30 minutes a day 5 days a week Decreased portions, healthy diet. We discussed different diets and discussed nutrition in detail. Deferred nutrition referral Interested in weight loss class we will see we can help with that-does not want to do Weight Watchers Discussed weight loss medication-saxenda would be approved by her insurance, but does require prior authorization. She is interested in trying that. Discussed potential side effects. Start at low dose and increase weekly She'll let me know if she has any questions or concerns Follow-up in June, sooner if needed

## 2015-08-23 NOTE — Progress Notes (Signed)
Subjective:    Patient ID: Janet Le, female    DOB: Apr 30, 1956, 60 y.o.   MRN: [id number]  HPI She is here because her blood pressure has been elevated at home.  Hypertension: She is taking her medication daily. She is compliant with a low sodium diet.  She denies chest pain, palpitations, shortness of breath and regular headaches.  She does monitor her blood pressure at home and her BP has been elevated.  Her numbers at home:   120/70, 144/79, 131/70, 131/82, 117/64, 136/79, 126/76, 138/86.  Obesity: She is somewhat active, but is not exercising.  She was able to lose 50 pounds last year when she gave up flour and sugar. She knows sometimes she needs to many sweets. She is interested in going for a weight loss class. She has seen a nutritionist in the past and does not feel that that would be helpful at this time. She is also interested in trying a weight loss medication. She has never been on weight loss medication in the past.   Medications and allergies reviewed with patient and updated if appropriate.  Patient Active Problem List   Diagnosis Date Noted  . Cephalalgia 07/20/2015  . Right renal mass   . Thrombocytopenia ([place]) 05/30/2015  . Arthritis of left lower extremity 02/02/2015  . Left knee pain 09/08/2014  . Gout 12/11/2013  . Vision disturbance 12/08/2013  . Severe obesity (BMI >= 40) ([place]) 11/06/2013  . Osteoporosis   . Cough 07/15/2012  . Left lumbar radiculopathy 04/13/2012  . Renal cell cancer ([place])   . External hemorrhoid 11/14/2010  . IBS (irritable bowel syndrome) 11/14/2010  . GERD (gastroesophageal reflux disease) 11/14/2010  . Urinary frequency 08/18/2010  . HYPERCHOLESTEROLEMIA, MILD 03/16/2010  . Lymphedema 03/15/2010  . Obesity 10/03/2009  . ALLERGIC RHINITIS 08/31/2009  . GLOBUS HYSTERICUS 08/26/2009  . MIGRAINE HEADACHE 06/14/2009  . SINUSITIS, CHRONIC 06/14/2009  . Headache 06/14/2009  . Personal history of diabetes mellitus 06/14/2009    . SYNCOPE 05/16/2009  . Essential hypertension 05/06/2009  . CONSTIPATION, CHRONIC 05/06/2009  . ENDOMETRIOSIS 05/06/2009    Current Outpatient Prescriptions on File Prior to Visit  Medication Sig Dispense Refill  . ACCU-CHEK SOFTCLIX LANCETS lancets by Other route. Use twice a day for blood sugar     . alendronate (FOSAMAX) 70 MG tablet Take 1 tablet (70 mg total) by mouth once a week. Take with a full glass of water on an empty stomach. 12 tablet 3  . amLODipine (NORVASC) 10 MG tablet Take 1 tablet (10 mg total) by mouth daily. 30 tablet 2  . atorvastatin (LIPITOR) 10 MG tablet Take 1 tablet (10 mg total) by mouth daily. 90 tablet 3  . calcium-vitamin D (OSCAL WITH D) 500-200 MG-UNIT tablet Take 1 tablet by mouth.    . Diclofenac Sodium 2 % SOLN Apply 1 pump twice daily. 112 g 1  . fluticasone (FLONASE) 50 MCG/ACT nasal spray Place 1 spray into both nostrils daily. 16 g 2  . furosemide (LASIX) 20 MG tablet Take 1 tablet (20 mg total) by mouth daily. 90 tablet 1  . glucose blood (ONETOUCH VERIO) test strip Use one strip to check blood sugar daily 100 each 4  . Linaclotide (LINZESS) 145 MCG CAPS capsule Take 1 capsule (145 mcg total) by mouth daily. 90 capsule 1  . naproxen (NAPROSYN) 500 MG tablet Take 1 tablet (500 mg total) by mouth 2 (two) times daily with a meal. 60 tablet 0  .  ONETOUCH DELICA LANCETS 99991111 MISC 1 each by Does not apply route daily. Use to help check blood sugars once daily Dx 250.00 100 each 3  . Prenatal Vit-Fe Fumarate-FA (PRENATAL MULTIVITAMIN) TABS tablet Take 1 tablet by mouth daily at 12 noon. 60 tablet 1  . propranolol (INDERAL) 80 MG tablet Take 1 tablet (80 mg total) by mouth 2 (two) times daily. 60 tablet 5  . traMADol-acetaminophen (ULTRACET) 37.5-325 MG per tablet Take 1 tablet by mouth every 8 (eight) hours as needed. 90 tablet 1  . valsartan (DIOVAN) 320 MG tablet Take 1 tablet (320 mg total) by mouth daily. 90 tablet 1   No current facility-administered  medications on file prior to visit.    Past Medical History  Diagnosis Date  . GERD (gastroesophageal reflux disease)   . Hypertension   . Chronic constipation   . Hiatal hernia   . Endometriosis   . Syncopal episodes     since childhood  . Chronic sinusitis   . Osteoarthritis   . Hyperlipidemia   . Allergic rhinitis, cause unspecified   . Irritable bowel syndrome   . Hemorrhoids   . Esophageal stricture   . Hiatal hernia   . Renal cell cancer ([place]) 11/2010 dx    R, s/p cryoablation 02/16/11  . Personal history of diabetes mellitus     controlled with diet only    Past Surgical History  Procedure Laterality Date  . Breast surgery      bilateral  . Tubal ligation    . Laser ablation of the cervix    . Functional endoscopic sinus surgery    . Fallopian tube removed    . Renal cryoablation  02/16/11    R kidney due to [place] (IR procedure)    Social History   Social History  . Marital Status: Married    Spouse Name: N/A  . Number of Children: N/A  . Years of Education: N/A   Occupational History  . 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 Smoking status: Never Smoker   . Smokeless tobacco: Never Used  . Alcohol Use: No     Comment: rare  . Drug Use: No  . Sexual Activity: No   Other Topics Concern  . None   Social History Narrative    Family History  Problem Relation Age of Onset  . Diabetes Mother   . Kidney disease Father   . Prostate cancer Father   . Colitis Brother   . Leukemia Brother   . Multiple sclerosis Sister   . Colon polyps    . Colon cancer Neg Hx     Review of Systems  Constitutional: Negative for fever.  Respiratory: Negative for cough, shortness of breath and wheezing.   Cardiovascular: Positive for leg swelling. Negative for chest pain and palpitations.  Neurological: Negative for light-headedness and headaches.       Objective:   Filed Vitals:   08/23/15 1116  BP:  104/66  Pulse: 61  Temp: 98.3 F (36.8 C)  Resp: 16   Filed Weights   08/23/15 1116  Weight: 244 lb (110.678 kg)   Body mass index is 44.62 kg/(m^2).   Physical Exam Constitutional: Appears well-developed and well-nourished. No distress.  Neck: Neck supple. No tracheal deviation present. No thyromegaly present.  No carotid bruit. No cervical adenopathy.   Cardiovascular: Normal rate, regular rhythm and normal heart sounds.   No murmur  heard.  1+ nonpitting edema Pulmonary/Chest: Effort normal and breath sounds normal. No respiratory distress. No wheezes.        Assessment & Plan:   See Problem List for Assessment and Plan of chronic medical problems.   30 minutes were spent face-to-face with the patient, over 50% of which was spent counseling regarding nutrition, healthy diet, weight loss, exercise and weight loss medication.  Follow-up in June

## 2015-08-23 NOTE — Patient Instructions (Addendum)
  Medications reviewed and updated.  We will start saxenda for weight loss - take as we discussed.  Continue to monitor you BP at home.   Start exercising regularly - work on increasing your exercise to 5 days a week for 30 minutes.  Decrease portions.  Follow up in June.

## 2015-08-25 ENCOUNTER — Telehealth: Payer: Self-pay

## 2015-08-25 NOTE — Telephone Encounter (Signed)
PA initiated via [place]

## 2015-08-25 NOTE — Telephone Encounter (Signed)
PA Approved thru 12/23/2015 - [id number]. Pharmacy and pt advised

## 2015-08-26 LAB — HM DIABETES EYE EXAM

## 2015-09-01 ENCOUNTER — Other Ambulatory Visit (HOSPITAL_BASED_OUTPATIENT_CLINIC_OR_DEPARTMENT_OTHER): Payer: BLUE CROSS/BLUE SHIELD

## 2015-09-01 DIAGNOSIS — D696 Thrombocytopenia, unspecified: Secondary | ICD-10-CM

## 2015-09-01 LAB — CBC WITH DIFFERENTIAL/PLATELET
BASO%: 0.4 % (ref 0.0–2.0)
Basophils Absolute: 0 10*3/uL (ref 0.0–0.1)
EOS%: 1.5 % (ref 0.0–7.0)
Eosinophils Absolute: 0.1 10*3/uL (ref 0.0–0.5)
HCT: 39.3 % (ref 34.8–46.6)
HGB: 12.8 g/dL (ref 11.6–15.9)
LYMPH%: 24.9 % (ref 14.0–49.7)
MCH: 30.6 pg (ref 25.1–34.0)
MCHC: 32.6 g/dL (ref 31.5–36.0)
MCV: 93.9 fL (ref 79.5–101.0)
MONO#: 0.5 10*3/uL (ref 0.1–0.9)
MONO%: 7.2 % (ref 0.0–14.0)
NEUT#: 5 10*3/uL (ref 1.5–6.5)
NEUT%: 66 % (ref 38.4–76.8)
Platelets: 130 10*3/uL — ABNORMAL LOW (ref 145–400)
RBC: 4.18 10*6/uL (ref 3.70–5.45)
RDW: 15.1 % — ABNORMAL HIGH (ref 11.2–14.5)
WBC: 7.5 10*3/uL (ref 3.9–10.3)
lymph#: 1.9 10*3/uL (ref 0.9–3.3)

## 2015-09-06 ENCOUNTER — Encounter: Payer: Self-pay | Admitting: Internal Medicine

## 2015-09-12 ENCOUNTER — Telehealth: Payer: Self-pay | Admitting: *Deleted

## 2015-09-12 NOTE — Telephone Encounter (Signed)
Spoke with pt and informed pt re:  Mild thrombocytopenia stable from CBC results done 09/01/15 as per md.  Confirmed with pt next appts  On June 1.  Pt voiced understanding.

## 2015-09-20 ENCOUNTER — Encounter: Payer: Self-pay | Admitting: Internal Medicine

## 2015-09-21 NOTE — Telephone Encounter (Signed)
Pt called and stated that she is seeing a "coach" and they informed her that she should at 2.4 with the weightloss injections. Please advise.

## 2015-09-22 ENCOUNTER — Encounter: Payer: Self-pay | Admitting: Internal Medicine

## 2015-09-23 MED ORDER — LIRAGLUTIDE -WEIGHT MANAGEMENT 18 MG/3ML ~~LOC~~ SOPN
2.4000 mg | PEN_INJECTOR | Freq: Every day | SUBCUTANEOUS | Status: DC
Start: 2015-09-23 — End: 2015-10-04

## 2015-09-23 MED ORDER — LIRAGLUTIDE -WEIGHT MANAGEMENT 18 MG/3ML ~~LOC~~ SOPN: 2.4000 mg | PEN_INJECTOR | Freq: Every day | SUBCUTANEOUS | Status: DC

## 2015-09-23 NOTE — Telephone Encounter (Signed)
Pt called in and has some questions about this previous e-mail.  Lovena Le can you call her when you get a chance?

## 2015-09-23 NOTE — Telephone Encounter (Signed)
Received call pt stated she had sent MD email yesterday and which she replied back. She wanted to let her know that she need script [facility] to [facility] for the 2.4. Verified pharamcy pt states she would [facility] whichever one is the [facility] she will get. Inform pt will send...Johny Chess

## 2015-09-27 ENCOUNTER — Telehealth: Payer: Self-pay | Admitting: *Deleted

## 2015-09-27 NOTE — Telephone Encounter (Signed)
Received call from [facility]. Needing to clarify dosage on the Saxenda pen. He states he saw where pt got a refill ot local [facility] so she will be doing the 2.4 mg for 1 wk then increasing to 3.0 mg. Will send directions 3.0mg  since she has already started titration med...Johny Chess

## 2015-09-30 ENCOUNTER — Other Ambulatory Visit: Payer: Self-pay

## 2015-09-30 MED ORDER — AMLODIPINE BESYLATE 10 MG PO TABS: 10.0000 mg | ORAL_TABLET | Freq: Every day | ORAL | Status: DC

## 2015-10-04 ENCOUNTER — Other Ambulatory Visit: Payer: Self-pay | Admitting: Emergency Medicine

## 2015-10-04 MED ORDER — LIRAGLUTIDE -WEIGHT MANAGEMENT 18 MG/3ML ~~LOC~~ SOPN: 2.4000 mg | PEN_INJECTOR | Freq: Every day | SUBCUTANEOUS | Status: DC

## 2015-10-04 NOTE — Telephone Encounter (Signed)
Please advise on directions for Medication.

## 2015-10-04 NOTE — Telephone Encounter (Signed)
Let her know [facility]

## 2015-10-05 NOTE — Telephone Encounter (Signed)
Spoke with pt to inform.  

## 2015-10-11 ENCOUNTER — Other Ambulatory Visit: Payer: Self-pay | Admitting: Hematology

## 2015-10-14 ENCOUNTER — Other Ambulatory Visit: Payer: Self-pay

## 2015-10-14 ENCOUNTER — Telehealth: Payer: Self-pay | Admitting: Internal Medicine

## 2015-10-14 DIAGNOSIS — Z1231 Encounter for screening mammogram for malignant neoplasm of breast: Secondary | ICD-10-CM

## 2015-10-14 DIAGNOSIS — M810 Age-related osteoporosis without current pathological fracture: Secondary | ICD-10-CM

## 2015-10-14 NOTE — Telephone Encounter (Signed)
Pt called herself to schedule a mammogram and a dexa scan. [facility] states they need a referral sent over for the dexa scan. Can you please put the order in

## 2015-10-14 NOTE — Telephone Encounter (Signed)
Order entered

## 2015-10-26 DIAGNOSIS — N281 Cyst of kidney, acquired: Secondary | ICD-10-CM | POA: Diagnosis not present

## 2015-10-26 DIAGNOSIS — Z0000 Encounter for general adult medical examination without abnormal findings: Secondary | ICD-10-CM | POA: Diagnosis not present

## 2015-10-31 ENCOUNTER — Telehealth: Payer: Self-pay

## 2015-10-31 MED ORDER — VALSARTAN 320 MG PO TABS: 320.0000 mg | ORAL_TABLET | Freq: Every day | ORAL | Status: DC

## 2015-10-31 MED ORDER — AMLODIPINE BESYLATE 10 MG PO TABS: 10.0000 mg | ORAL_TABLET | Freq: Every day | ORAL | Status: DC

## 2015-10-31 NOTE — Telephone Encounter (Signed)
LVM for pt to call back, need to know where she would like the 7 day RX going to

## 2015-10-31 NOTE — Telephone Encounter (Signed)
Patient needs a 7days refill for her BP medications because the mail order will not be here in time and she will be out of her meds. She ask to please be called back when this is taking care of or if you have any questions. Meds are: Valsartan ref # [id number] and Amlodipine ref # [id number]  The phone number is [phone] Please advise or follow up. Thank you.

## 2015-10-31 NOTE — Telephone Encounter (Signed)
RX Request sent to local [facility]

## 2015-10-31 NOTE — Telephone Encounter (Signed)
Needs 7 day script sent to [facility] on [address]

## 2015-11-10 ENCOUNTER — Other Ambulatory Visit: Payer: Self-pay | Admitting: Internal Medicine

## 2015-11-15 ENCOUNTER — Telehealth: Payer: Self-pay | Admitting: Emergency Medicine

## 2015-11-15 MED ORDER — INSULIN PEN NEEDLE 32G X 6 MM MISC: Status: DC

## 2015-11-15 NOTE — Telephone Encounter (Signed)
[facility] for new RX for pen needles. Sent in.

## 2015-11-18 ENCOUNTER — Ambulatory Visit
Admission: RE | Admit: 2015-11-18 | Discharge: 2015-11-18 | Disposition: A | Discharge status: Home / Self Care | Payer: BLUE CROSS/BLUE SHIELD | Source: Ambulatory Visit

## 2015-11-18 ENCOUNTER — Encounter: Payer: Self-pay | Admitting: Internal Medicine

## 2015-11-18 ENCOUNTER — Ambulatory Visit
Admission: RE | Admit: 2015-11-18 | Discharge: 2015-11-18 | Disposition: A | Discharge status: Home / Self Care | Payer: BLUE CROSS/BLUE SHIELD | Source: Ambulatory Visit | Attending: Internal Medicine | Admitting: Internal Medicine

## 2015-11-18 DIAGNOSIS — M810 Age-related osteoporosis without current pathological fracture: Secondary | ICD-10-CM

## 2015-11-18 DIAGNOSIS — M8580 Other specified disorders of bone density and structure, unspecified site: Secondary | ICD-10-CM | POA: Insufficient documentation

## 2015-11-18 DIAGNOSIS — Z780 Asymptomatic menopausal state: Secondary | ICD-10-CM | POA: Diagnosis not present

## 2015-11-18 DIAGNOSIS — Z1231 Encounter for screening mammogram for malignant neoplasm of breast: Secondary | ICD-10-CM | POA: Diagnosis not present

## 2015-11-18 DIAGNOSIS — M8588 Other specified disorders of bone density and structure, other site: Secondary | ICD-10-CM | POA: Diagnosis not present

## 2015-11-18 HISTORY — DX: Other specified disorders of bone density and structure, unspecified site: M85.80

## 2015-11-24 ENCOUNTER — Ambulatory Visit (HOSPITAL_BASED_OUTPATIENT_CLINIC_OR_DEPARTMENT_OTHER): Payer: BLUE CROSS/BLUE SHIELD | Admitting: Hematology

## 2015-11-24 ENCOUNTER — Other Ambulatory Visit (HOSPITAL_BASED_OUTPATIENT_CLINIC_OR_DEPARTMENT_OTHER): Payer: BLUE CROSS/BLUE SHIELD

## 2015-11-24 ENCOUNTER — Telehealth: Payer: Self-pay | Admitting: Hematology

## 2015-11-24 ENCOUNTER — Encounter: Payer: Self-pay | Admitting: Hematology

## 2015-11-24 VITALS — BP 119/73 | HR 75 | Temp 98.9°F | Resp 18 | Ht 62.0 in | Wt 231.9 lb

## 2015-11-24 DIAGNOSIS — D696 Thrombocytopenia, unspecified: Secondary | ICD-10-CM

## 2015-11-24 DIAGNOSIS — I10 Essential (primary) hypertension: Secondary | ICD-10-CM

## 2015-11-24 DIAGNOSIS — K589 Irritable bowel syndrome without diarrhea: Secondary | ICD-10-CM | POA: Diagnosis not present

## 2015-11-24 LAB — CBC WITH DIFFERENTIAL/PLATELET
BASO%: 0.5 % (ref 0.0–2.0)
Basophils Absolute: 0 10*3/uL (ref 0.0–0.1)
EOS%: 2.4 % (ref 0.0–7.0)
Eosinophils Absolute: 0.2 10*3/uL (ref 0.0–0.5)
HCT: 37.3 % (ref 34.8–46.6)
HGB: 12.1 g/dL (ref 11.6–15.9)
LYMPH%: 30.3 % (ref 14.0–49.7)
MCH: 31.1 pg (ref 25.1–34.0)
MCHC: 32.6 g/dL (ref 31.5–36.0)
MCV: 95.3 fL (ref 79.5–101.0)
MONO#: 0.5 10*3/uL (ref 0.1–0.9)
MONO%: 7.6 % (ref 0.0–14.0)
NEUT#: 4.2 10*3/uL (ref 1.5–6.5)
NEUT%: 59.2 % (ref 38.4–76.8)
Platelets: 129 10*3/uL — ABNORMAL LOW (ref 145–400)
RBC: 3.91 10*6/uL (ref 3.70–5.45)
RDW: 14.5 % (ref 11.2–14.5)
WBC: 7.1 10*3/uL (ref 3.9–10.3)
lymph#: 2.2 10*3/uL (ref 0.9–3.3)

## 2015-11-24 NOTE — Progress Notes (Signed)
[facility]  [phone] [phone]  Clinic follow Up Note   Patient Care Team: Binnie Rail, MD as PCP - General (Internal Medicine) Sable Feil, MD as Consulting Physician (Gastroenterology) Larey Dresser, MD as Consulting Physician (Cardiology) Izora Gala, MD as Consulting Physician (Otolaryngology) Roselyn Bering, PA-C as Physician Assistant (Orthopedic Surgery) Juanda Chance, NP as Nurse Practitioner (Obstetrics and Gynecology) Franchot Gallo, MD as Consulting Physician (Urology) Aletta Edouard, MD as Consulting Physician (Radiology) Sharol Roussel, [place] (Optometry) Pieter Partridge, DO (Neurology) Charlett Blake, MD (Physical Medicine and Rehabilitation) 11/24/2015  REFERRAL PHYSICIAN: Binnie Rail, MD  CHIEF COMPLAINTS:  Follow Up thrombocytopenia   HISTORY OF PRESENTING ILLNESS:  Janet Le 60 y.o. female with past medical history of hypertension, irritable bowel syndrome, renal cell carcinoma, is here because of chronic mild thrombocytopenia.   She was found to have a platelet count 143 on her recent routine lab test by her gynecologist. She is not aware if she had low platelet count before. According to her lab test result in our EPIC system, she has had intermittent mild thrombocytopenia for the past 6 months, with platelet in the range of 120-150K.  She denied any bleeding episodes including hematochezia, melana, hemoptysis, hematuria or epitaxis. No mucosal bleeding or easy bruising. She reports occasional bruise on her arms.  She has chronic constipation from her irritable bowel syndrome. She is on Linzess. She also has chronic acid reflux, was on Nexium before, and has been on Protonix for the past several years. She denies any new medications recently. She feels well in general, has mild fatigue, able to function very well, has intermittent low back pain and knee pain after long distance walking. She otherwise denies any  other significant pain, dyspnea, fever, chills, night sweats, or other symptoms.  CURRENT THERAPY: observation  INTERIM HISTORY Janet Le returns for follow-up. She is doing well overall, she denies any episodes of bleeding, easy bruising, or petechia. She has been off Protonix since she saw me last time, and is currently taking prenatal vitamin once daily. She still has issues with her constipation, on Linzess, which did help before but not much lately. She has been trying to eat healthy and loose some weight. No other new complaints.  MEDICAL HISTORY:  Past Medical History  Diagnosis Date  . GERD (gastroesophageal reflux disease)   . Hypertension   . Chronic constipation   . Hiatal hernia   . Endometriosis   . Syncopal episodes     since childhood  . Chronic sinusitis   . Osteoarthritis   . Hyperlipidemia   . Allergic rhinitis, cause unspecified   . Irritable bowel syndrome   . Hemorrhoids   . Esophageal stricture   . Hiatal hernia   . Renal cell cancer ([place]) 11/2010 dx    R, s/p cryoablation 02/16/11  . Personal history of diabetes mellitus     controlled with diet only    SURGICAL HISTORY: Past Surgical History  Procedure Laterality Date  . Breast surgery      bilateral  . Tubal ligation    . Laser ablation of the cervix    . Functional endoscopic sinus surgery    . Fallopian tube removed    . Renal cryoablation  02/16/11    R kidney due to [place] (IR procedure)    SOCIAL HISTORY: Social History   Social History  . Marital Status: Married    Spouse Name: N/A  . Number of Children:  N/A  . Years of Education: N/A   Occupational History  . 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 Smoking status: Never Smoker   . Smokeless tobacco: Never Used  . Alcohol Use: No     Comment: rare  . Drug Use: No  . Sexual Activity: No   Other Topics Concern  . Not on file   Social History Narrative    FAMILY  HISTORY: Family History  Problem Relation Age of Onset  . Diabetes Mother   . Kidney disease Father   . Prostate cancer Father   . Colitis Brother   . Leukemia Brother   . Multiple sclerosis Sister   . Colon polyps    . Colon cancer Neg Hx     ALLERGIES:  is allergic to sertraline hcl; ace inhibitors; codeine; darifenacin hydrobromide er; gabapentin; pravastatin; propoxyphene hcl; and wellbutrin.  MEDICATIONS:  Current Outpatient Prescriptions  Medication Sig Dispense Refill  . ACCU-CHEK SOFTCLIX LANCETS lancets by Other route. Use twice a day for blood sugar     . alendronate (FOSAMAX) 70 MG tablet Take 1 tablet (70 mg total) by mouth once a week. Take with a full glass of water on an empty stomach. 12 tablet 3  . amLODipine (NORVASC) 10 MG tablet Take 1 tablet (10 mg total) by mouth daily. 7 tablet 0  . atorvastatin (LIPITOR) 10 MG tablet Take 1 tablet (10 mg total) by mouth daily. 90 tablet 3  . calcium-vitamin D (OSCAL WITH D) 500-200 MG-UNIT tablet Take 1 tablet by mouth.    . Diclofenac Sodium 2 % SOLN Apply 1 pump twice daily. 112 g 1  . fluticasone (FLONASE) 50 MCG/ACT nasal spray Place 1 spray into both nostrils daily. 16 g 2  . furosemide (LASIX) 20 MG tablet Take 1 tablet (20 mg total) by mouth daily. 90 tablet 1  . glucose blood (ONETOUCH VERIO) test strip Use one strip to check blood sugar daily 100 each 4  . Insulin Pen Needle (NOVOFINE) 32G X 6 MM MISC Use daily with Saxenda 100 each 1  . Insulin Pen Needle 32G X 5 MM MISC Use as directed once daily for saxenda 30 each 3  . Linaclotide (LINZESS) 145 MCG CAPS capsule Take 1 capsule (145 mcg total) by mouth daily. 90 capsule 1  . Liraglutide -Weight Management (SAXENDA) 18 MG/3ML SOPN Inject 2.4 mg into the skin daily. For one week then increase to 3.0 mg daily 14 pen 3  . naproxen (NAPROSYN) 500 MG tablet Take 1 tablet (500 mg total) by mouth 2 (two) times daily with a meal. 60 tablet 0  . ONETOUCH DELICA LANCETS 99991111  MISC 1 each by Does not apply route daily. Use to help check blood sugars once daily Dx 250.00 100 each 3  . Prenatal Vit-Fe Fumarate-FA (PNV PRENATAL PLUS MULTIVITAMIN) 27-1 MG TABS TAKE 1 TABLET DAILY AT 12 NOON 60 tablet 0  . propranolol (INDERAL) 80 MG tablet Take 1 tablet (80 mg total) by mouth 2 (two) times daily. 60 tablet 5  . traMADol-acetaminophen (ULTRACET) 37.5-325 MG per tablet Take 1 tablet by mouth every 8 (eight) hours as needed. 90 tablet 1  . valsartan (DIOVAN) 320 MG tablet Take 1 tablet (320 mg total) by mouth daily. 7 tablet 0   No current facility-administered medications for this visit.    REVIEW OF SYSTEMS:   Constitutional: Denies fevers, chills or abnormal night sweats  Eyes: Denies blurriness of vision, double vision or watery eyes Ears, nose, mouth, throat, and face: Denies mucositis or sore throat Respiratory: Denies cough, dyspnea or wheezes Cardiovascular: Denies palpitation, chest discomfort or lower extremity swelling Gastrointestinal:  Denies nausea, heartburn or change in bowel habits Skin: Denies abnormal skin rashes Lymphatics: Denies new lymphadenopathy or easy bruising Neurological:Denies numbness, tingling or new weaknesses Behavioral/Psych: Mood is stable, no new changes  All other systems were reviewed with the patient and are negative.  PHYSICAL EXAMINATION: ECOG PERFORMANCE STATUS: 0 - Asymptomatic  Filed Vitals:   11/24/15 1023  BP: 119/73  Pulse: 75  Temp: 98.9 F (37.2 C)  Resp: 18   Filed Weights   11/24/15 1023  Weight: 231 lb 14.4 oz (105.189 kg)    GENERAL:alert, no distress and comfortable SKIN: skin color, texture, turgor are normal, no rashes or significant lesions, no petechia or ecchymosis EYES: normal, conjunctiva are pink and non-injected, sclera clear OROPHARYNX:no exudate, no erythema and lips, buccal mucosa, and tongue normal  NECK: supple, thyroid normal size, non-tender, without nodularity LYMPH:  no palpable  lymphadenopathy in the cervical, axillary or inguinal LUNGS: clear to auscultation and percussion with normal breathing effort HEART: regular rate & rhythm and no murmurs and no lower extremity edema ABDOMEN:abdomen soft, non-tender and normal bowel sounds Musculoskeletal:no cyanosis of digits and no clubbing  PSYCH: alert & oriented x 3 with fluent speech NEURO: no focal motor/sensory deficits  LABORATORY DATA:  I have reviewed the data as listed CBC Latest Ref Rng 11/24/2015 09/01/2015 07/07/2015  WBC 3.9 - 10.3 10e3/uL 7.1 7.5 6.3  Hemoglobin 11.6 - 15.9 g/dL 12.1 12.8 12.7  Hematocrit 34.8 - 46.6 % 37.3 39.3 39.4  Platelets 145 - 400 10e3/uL 129(L) 130(L) 123(L)    CMP Latest Ref Rng 05/30/2015 05/25/2015 08/10/2014  Glucose 70 - 99 mg/dL 94 - -  BUN 6 - 23 mg/dL 14 14 16   Creatinine 0.40 - 1.20 mg/dL 0.92 0.89 0.9  Sodium 135 - 145 mEq/L 141 - 142  Potassium 3.5 - 5.1 mEq/L 4.2 - 4.1  Chloride 96 - 112 mEq/L 107 - -  CO2 19 - 32 mEq/L 29 - -  Calcium 8.4 - 10.5 mg/dL 9.9 - -  Total Protein 6.0 - 8.3 g/dL 7.4 - -  Total Bilirubin 0.2 - 1.2 mg/dL 0.5 - -  Alkaline Phos 39 - 117 U/L 88 - 73  AST 0 - 37 U/L 17 - 34  ALT 0 - 35 U/L 10 - 16     RADIOGRAPHIC STUDIES: I have personally reviewed the radiological images as listed and agreed with the findings in the report. Dg Bone Density  11/18/2015  EXAM: DUAL X-RAY ABSORPTIOMETRY (DXA) FOR BONE MINERAL DENSITY IMPRESSION: Referring Physician:  Claudina Lick BURNS PATIENT: Name: Kamillah, Devivo Patient ID: [id number] Birth Date: [birth year] Height: 61.5 in. Sex: Female Measured: 11/18/2015 Weight: 232.0 lbs. Indications: Estrogen Deficient, Family Hist. (Parent hip fracture), Postmenopausal Fractures: None Treatments: Calcium (E943.0), Fosamax, Vitamin D (E933.5) ASSESSMENT: The BMD measured at AP Spine L1-L4 is 1.028 g/cm2 with a T-score of -1.3. This patient is considered osteopenic according to [facility]) criteria.  Patient is not eligible for FRAX due to Fosamax . Per the official positions of the ISCD, it is not possible to quantitatively compare BMD or calculate an [facility] between exams done at different facilities . Site Region Measured Date Measured Age YA BMD Significant CHANGE T-score AP Spine  L1-L4     11/18/2015  60.3         -1.3    1.028 g/cm2 DualFemur Neck Left 11/18/2015    60.3         -0.9    0.909 g/cm2 World Health Organization [facility]) criteria for post-menopausal, Caucasian Women: Normal       T-score at or above -1 SD Osteopenia   T-score between -1 and -2.5 SD Osteoporosis T-score at or below -2.5 SD RECOMMENDATION: [place] recommends that FDA-approved medical therapies be considered in postmenopausal women and men age 55 or older with a: 1. Hip or vertebral (clinical or morphometric) fracture. 2. T-score of <-2.5 at the spine or hip. 3. Ten-year fracture probability by FRAX of 3% or greater for hip fracture or 20% or greater for major osteoporotic fracture. All treatment decisions require clinical judgment and consideration of individual patient factors, including patient preferences, co-morbidities, previous drug use, risk factors not captured in the FRAX model (e.g. falls, vitamin D deficiency, increased bone turnover, interval significant decline in bone density) and possible under - or over-estimation of fracture risk by FRAX. All patients should ensure an adequate intake of dietary calcium (1200 mg/d) and vitamin D (800 IU daily) unless contraindicated. FOLLOW-UP: People with diagnosed cases of osteoporosis or at high risk for fracture should have regular bone mineral density tests. For patients eligible for Medicare, routine testing is allowed once every 2 years. The testing frequency can be increased to one year for patients who have rapidly progressing disease, those who are receiving or discontinuing medical therapy to restore bone mass, or have additional risk factors. I  have reviewed this report, and agree with the above findings. [facility] Radiology Electronically Signed   By: Lahoma Crocker M.D.   On: 11/18/2015 13:08   Mm Digital Screening Bilateral  11/22/2015  CLINICAL DATA:  Screening. EXAM: DIGITAL SCREENING BILATERAL MAMMOGRAM WITH CAD COMPARISON:  Previous exam(s). ACR Breast Density Category a: The breast tissue is almost entirely fatty. FINDINGS: There are no findings suspicious for malignancy. Images were processed with CAD. IMPRESSION: No mammographic evidence of malignancy. A result letter of this screening mammogram will be mailed directly to the patient. RECOMMENDATION: Screening mammogram in one year. (Code:SM-B-01Y) BI-RADS CATEGORY  1: Negative. Electronically Signed   By: Lovey Newcomer M.D.   On: 11/22/2015 17:47    ASSESSMENT & PLAN: 60 year old African-American female, with past medical history of hypertension, irritable bowel syndrome, GERD, history of renal cell carcinoma status post cryoablation, presented with chronic mild thrombocytopenia or more than 6 years.  1. 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  -I discussed the common etiology for chronic mild thrombocytopenia, which include autoimmune related (ITP), liver disease, splenomegaly, medication or alcohol induced, chronic infection such as hepatitis C and HIV, and bone marrow disease such as MDS. The other etiology such as infection, malignancy, microangiopathy, DIC a less likely given the indolent course and his clinical presentation. -I reviewed her medication list and suggest him to stop protonix,  which can induce thrombocytopenia.  -She does not have history of hepatitis, liver functions normal, recent CT of abdomen showed normal appearance of liver and spleen. HIV was checked which was negative - ANA was negative and SER was normal, methylmalonic acid level was elevated,  indicating mild B12 deficiency. She is on prenatal vitamin which contains folic acid and [id number]. 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 Given her mild thrombocytopenia, she  does not need any treatment for now, but her blood counts need to be monitored -We discussed the risk of bleeding from thrombocytopenia. Giving the mild degree of thrombocytopenia, the risk of bleeding is pretty low. But she knows to avoid injury.  -Her platelet count has been very stable over a few years, I'll see her back next year.  2. history of RCC, status post cryoablation -her recent CT scan showed no evidence of recurrence -She follows up with interventional radiologist Dr. Kathlene Cote  3. Hypertension, IBS,  -She will continue follow-up with her primary care physician and GI   Plan -Lab results reviewed with patient, I given her a copy, including her previous lab results. -She will see her primary care physician Dr. Quay Burow tomorrow -I'll see her back in 15 months. She will follow-up with Dr. Quay Burow and have a repeat CBC 1-2 time a year    All questions were answered. The patient knows to call the clinic with any problems, questions or concerns. I spent 15 minutes counseling the patient face to face. The total time spent in the appointment was 20 minutes and more than 50% was on counseling.     Truitt Merle, MD 11/24/2015 10:38 AM

## 2015-11-24 NOTE — Telephone Encounter (Signed)
Gave pt avs °

## 2015-11-28 ENCOUNTER — Ambulatory Visit (INDEPENDENT_AMBULATORY_CARE_PROVIDER_SITE_OTHER): Payer: BLUE CROSS/BLUE SHIELD | Admitting: Internal Medicine

## 2015-11-28 ENCOUNTER — Encounter: Payer: Self-pay | Admitting: Internal Medicine

## 2015-11-28 VITALS — BP 110/70 | HR 79 | Temp 98.7°F | Resp 16 | Wt 228.0 lb

## 2015-11-28 DIAGNOSIS — I10 Essential (primary) hypertension: Secondary | ICD-10-CM | POA: Diagnosis not present

## 2015-11-28 DIAGNOSIS — E669 Obesity, unspecified: Secondary | ICD-10-CM

## 2015-11-28 DIAGNOSIS — M8580 Other specified disorders of bone density and structure, unspecified site: Secondary | ICD-10-CM | POA: Diagnosis not present

## 2015-11-28 MED ORDER — VALSARTAN 320 MG PO TABS: 320.0000 mg | ORAL_TABLET | Freq: Every day | ORAL | Status: DC

## 2015-11-28 MED ORDER — LIRAGLUTIDE -WEIGHT MANAGEMENT 18 MG/3ML ~~LOC~~ SOPN: 3.0000 mg | PEN_INJECTOR | Freq: Every day | SUBCUTANEOUS | Status: DC

## 2015-11-28 NOTE — Assessment & Plan Note (Signed)
Dexa 2014 showed OP, was placed on fosamax 06/2012 Dexa 2015 showed improvement, further improvement in 2017 Dexa 2017 - osteopenia - will continue fosamax for full 5 years and then stop Repeat Dexa 2019

## 2015-11-28 NOTE — Progress Notes (Signed)
Pre visit review using our clinic review tool, if applicable. No additional management support is needed unless otherwise documented below in the visit note. 

## 2015-11-28 NOTE — Patient Instructions (Addendum)
  Medications reviewed and updated.  No changes recommended at this time.  Your prescription(s) have been submitted to your pharmacy. Please take as directed and contact our office if you believe you are having problem(s) with the medication(s).    Please followup in 6 months   

## 2015-11-28 NOTE — Progress Notes (Signed)
Subjective:    Patient ID: Janet Le, female    DOB: 1956-06-10, 60 y.o.   MRN: [id number]  HPI She is here for follow up.  Hypertension: She is taking her medication daily. She is compliant with a low sodium diet.  She denies chest pain, palpitations, edema, shortness of breath and regular headaches. She is exercising regularly.  She does monitor her blood pressure at home and it is controlled.  She has lost weight with taking the saxenda, cutting down on food and exercising.     Weight loss, obesity:  She is taking tha sexenda daily.  She initially had some nausea, but no longer has it.  She has lost wieght with decreasing her portions and exercising.   Osteoporosis/osteopenia:  She initially had osteoporosis in 2014 was started on Fosamax at that time.   Her bone density improved in 2015 and her dexa most recently showed continued improvement.   She now has osteopenia. She has been tolerating the fosamax.  She will have bone pain throughout the whole body if she stands long periods of time, but not under any other circumstances - she is unsure if it is related to the fosamax.     Medications and allergies reviewed with patient and updated if appropriate.  Patient Active Problem List   Diagnosis Date Noted  . Osteopenia 11/18/2015  . Cephalalgia 07/20/2015  . Right renal mass   . Thrombocytopenia ([place]) 05/30/2015  . Arthritis of left lower extremity 02/02/2015  . Left knee pain 09/08/2014  . Gout 12/11/2013  . Vision disturbance 12/08/2013  . Severe obesity (BMI >= 40) ([place]) 11/06/2013  . Osteoporosis   . Cough 07/15/2012  . Left lumbar radiculopathy 04/13/2012  . Renal cell cancer ([place])   . External hemorrhoid 11/14/2010  . IBS (irritable bowel syndrome) 11/14/2010  . GERD (gastroesophageal reflux disease) 11/14/2010  . Urinary frequency 08/18/2010  . HYPERCHOLESTEROLEMIA, MILD 03/16/2010  . Lymphedema 03/15/2010  . Obesity 10/03/2009  . ALLERGIC RHINITIS  08/31/2009  . GLOBUS HYSTERICUS 08/26/2009  . MIGRAINE HEADACHE 06/14/2009  . SINUSITIS, CHRONIC 06/14/2009  . Headache 06/14/2009  . Personal history of diabetes mellitus 06/14/2009  . SYNCOPE 05/16/2009  . Essential hypertension 05/06/2009  . CONSTIPATION, CHRONIC 05/06/2009  . ENDOMETRIOSIS 05/06/2009    Current Outpatient Prescriptions on File Prior to Visit  Medication Sig Dispense Refill  . ACCU-CHEK SOFTCLIX LANCETS lancets by Other route. Use twice a day for blood sugar     . alendronate (FOSAMAX) 70 MG tablet Take 1 tablet (70 mg total) by mouth once a week. Take with a full glass of water on an empty stomach. 12 tablet 3  . amLODipine (NORVASC) 10 MG tablet Take 1 tablet (10 mg total) by mouth daily. 7 tablet 0  . atorvastatin (LIPITOR) 10 MG tablet Take 1 tablet (10 mg total) by mouth daily. 90 tablet 3  . calcium-vitamin D (OSCAL WITH D) 500-200 MG-UNIT tablet Take 1 tablet by mouth.    . Diclofenac Sodium 2 % SOLN Apply 1 pump twice daily. 112 g 1  . fluticasone (FLONASE) 50 MCG/ACT nasal spray Place 1 spray into both nostrils daily. 16 g 2  . furosemide (LASIX) 20 MG tablet Take 1 tablet (20 mg total) by mouth daily. 90 tablet 1  . glucose blood (ONETOUCH VERIO) test strip Use one strip to check blood sugar daily 100 each 4  . Insulin Pen Needle (NOVOFINE) 32G X 6 MM MISC Use daily with Saxenda 100  each 1  . Insulin Pen Needle 32G X 5 MM MISC Use as directed once daily for saxenda 30 each 3  . Linaclotide (LINZESS) 145 MCG CAPS capsule Take 1 capsule (145 mcg total) by mouth daily. 90 capsule 1  . Liraglutide -Weight Management (SAXENDA) 18 MG/3ML SOPN Inject 2.4 mg into the skin daily. For one week then increase to 3.0 mg daily 14 pen 3  . naproxen (NAPROSYN) 500 MG tablet Take 1 tablet (500 mg total) by mouth 2 (two) times daily with a meal. 60 tablet 0  . ONETOUCH DELICA LANCETS 99991111 MISC 1 each by Does not apply route daily. Use to help check blood sugars once daily Dx  250.00 100 each 3  . Prenatal Vit-Fe Fumarate-FA (PNV PRENATAL PLUS MULTIVITAMIN) 27-1 MG TABS TAKE 1 TABLET DAILY AT 12 NOON 60 tablet 0  . propranolol (INDERAL) 80 MG tablet Take 1 tablet (80 mg total) by mouth 2 (two) times daily. 60 tablet 5  . traMADol-acetaminophen (ULTRACET) 37.5-325 MG per tablet Take 1 tablet by mouth every 8 (eight) hours as needed. 90 tablet 1   No current facility-administered medications on file prior to visit.    Past Medical History  Diagnosis Date  . GERD (gastroesophageal reflux disease)   . Hypertension   . Chronic constipation   . Hiatal hernia   . Endometriosis   . Syncopal episodes     since childhood  . Chronic sinusitis   . Osteoarthritis   . Hyperlipidemia   . Allergic rhinitis, cause unspecified   . Irritable bowel syndrome   . Hemorrhoids   . Esophageal stricture   . Hiatal hernia   . Renal cell cancer ([place]) 11/2010 dx    R, s/p cryoablation 02/16/11  . Personal history of diabetes mellitus     controlled with diet only    Past Surgical History  Procedure Laterality Date  . Breast surgery      bilateral  . Tubal ligation    . Laser ablation of the cervix    . Functional endoscopic sinus surgery    . Fallopian tube removed    . Renal cryoablation  02/16/11    R kidney due to [place] (IR procedure)    Social History   Social History  . Marital Status: Married    Spouse Name: N/A  . Number of Children: N/A  . Years of Education: N/A   Occupational History  . 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 Smoking status: Never Smoker   . Smokeless tobacco: Never Used  . Alcohol Use: No     Comment: rare  . Drug Use: No  . Sexual Activity: No   Other Topics Concern  . Not on file   Social History Narrative    Family History  Problem Relation Age of Onset  . Diabetes Mother   . Kidney disease Father   . Prostate cancer Father   . Colitis Brother   . Leukemia Brother     . Multiple sclerosis Sister   . Colon polyps    . Colon cancer Neg Hx     Review of Systems  Constitutional: Negative for fever.  Respiratory: Negative for cough, shortness of breath and wheezing.   Cardiovascular: Positive for leg swelling (stable). Negative for chest pain and palpitations.  Neurological: Positive for headaches (occasional). Negative for dizziness and light-headedness.       Objective:  Filed Vitals:   11/28/15 1016  BP: 110/70  Pulse: 79  Temp: 98.7 F (37.1 C)  Resp: 16   Filed Weights   11/28/15 1016  Weight: 228 lb (103.42 kg)   Body mass index is 41.69 kg/(m^2).   Physical Exam Constitutional: Appears well-developed and well-nourished. No distress.  Neck: Neck supple. No tracheal deviation present. No thyromegaly present.  No carotid bruit. No cervical adenopathy.   Cardiovascular: Normal rate, regular rhythm and normal heart sounds.   No murmur heard.  No edema Pulmonary/Chest: Effort normal and breath sounds normal. No respiratory distress. No wheezes.       Assessment & Plan:   See Problem List for Assessment and Plan of chronic medical problems.  F/u in 6 months

## 2015-11-28 NOTE — Assessment & Plan Note (Signed)
Dexa 2017 - osteopenia - will continue fosamax for full 5 years and then stop Repeat Dexa 2019

## 2015-11-28 NOTE — Assessment & Plan Note (Signed)
On saxenda - tolerating it well Losing weight Will continue saxenda

## 2015-11-28 NOTE — Assessment & Plan Note (Signed)
BP well controlled Current regimen effective and well tolerated Continue current medications at current doses  

## 2015-12-01 ENCOUNTER — Telehealth: Payer: Self-pay | Admitting: Emergency Medicine

## 2015-12-01 MED ORDER — VALSARTAN 320 MG PO TABS: 320.0000 mg | ORAL_TABLET | Freq: Every day | ORAL | Status: DC

## 2015-12-16 NOTE — Telephone Encounter (Signed)
Refill was sent closing encounter...Janet Le

## 2016-01-01 ENCOUNTER — Other Ambulatory Visit: Payer: Self-pay | Admitting: Internal Medicine

## 2016-01-03 ENCOUNTER — Telehealth: Payer: Self-pay | Admitting: Internal Medicine

## 2016-01-03 MED ORDER — PROPRANOLOL HCL 80 MG PO TABS: 80.0000 mg | ORAL_TABLET | Freq: Two times a day (BID) | ORAL | Status: DC

## 2016-01-03 NOTE — Telephone Encounter (Signed)
RXs sent.

## 2016-01-03 NOTE — Addendum Note (Signed)
Addended by: Terence Lux B on: 01/03/2016 04:00 PM   Modules accepted: Orders

## 2016-01-03 NOTE — Telephone Encounter (Signed)
Per chart MD sent 30 day to [facility] on yesterday 7/10, [facility]...Johny Chess

## 2016-01-03 NOTE — Telephone Encounter (Signed)
Patient called to advise that upon looking at her medication, she sees that she will actually need 2 weeks of medication sent to [facility]. Please replace the script sent to [facility] with a 2 week supply.

## 2016-01-03 NOTE — Telephone Encounter (Signed)
Pt request refill for propranolol (INDERAL) 80 MG tablet [facility] supply [facility]. Please help

## 2016-01-04 ENCOUNTER — Other Ambulatory Visit: Payer: Self-pay | Admitting: *Deleted

## 2016-01-04 MED ORDER — PNV PRENATAL PLUS MULTIVITAMIN 27-1 MG PO TABS: ORAL_TABLET | ORAL | Status: DC

## 2016-01-20 ENCOUNTER — Other Ambulatory Visit: Payer: Self-pay | Admitting: *Deleted

## 2016-01-20 MED ORDER — AMLODIPINE BESYLATE 10 MG PO TABS: 10.0000 mg | ORAL_TABLET | Freq: Every day | ORAL | 3 refills | Status: DC

## 2016-01-24 ENCOUNTER — Telehealth: Payer: Self-pay | Admitting: Emergency Medicine

## 2016-01-24 NOTE — Telephone Encounter (Signed)
Spoke with pt to inform.  

## 2016-01-24 NOTE — Telephone Encounter (Signed)
Please advise 

## 2016-01-24 NOTE — Telephone Encounter (Signed)
Yes, she should restart it.

## 2016-01-24 NOTE — Telephone Encounter (Signed)
Dr Quay Burow took patient off her weekly bone medicine. Alendronate (FOSAMAX) 70 MG tablet.The patient is still have neck and shoulder pain. She doesn't see any difference and wants to know if she needs to start taking it again. Please follow up thanks.

## 2016-01-31 ENCOUNTER — Telehealth: Payer: Self-pay | Admitting: Internal Medicine

## 2016-01-31 NOTE — Telephone Encounter (Signed)
Patient called to advise that she needs PA for saxenda pens. She states that she is also in need of the needles for this medication?   I do not even see this med on the current list.

## 2016-01-31 NOTE — Telephone Encounter (Signed)
Med is listed on medication list, please advise on PA

## 2016-02-01 NOTE — Telephone Encounter (Signed)
Attempted to renew PA for Saxenda, received message via CoverMyMeds: PA has already submitted and is in process for this patient and drug. Unable to process PA  Please advise if this was done

## 2016-02-02 ENCOUNTER — Telehealth: Payer: Self-pay | Admitting: Emergency Medicine

## 2016-02-02 NOTE — Telephone Encounter (Signed)
Janet Le,  Pt called back in regards. I have not submitted PA. Checked cover My Meds and do not have patients name on my account. Please check on this a possibly call insurance??

## 2016-02-02 NOTE — Telephone Encounter (Signed)
Patient has called back in regard.  Would like a call back as soon as possible. Did explain the PA process to patient.  Patients phone number is [phone].

## 2016-02-02 NOTE — Telephone Encounter (Signed)
Pt called and wanted you to look at her pre authorization for her medications. Please follow up thanks.

## 2016-02-03 NOTE — Telephone Encounter (Signed)
PA APPROVED via [employer] [phone] 01/02/2016 - 06/02/2016, pt advised of same

## 2016-03-07 ENCOUNTER — Telehealth: Payer: Self-pay | Admitting: *Deleted

## 2016-03-07 ENCOUNTER — Other Ambulatory Visit: Payer: Self-pay | Admitting: Hematology

## 2016-03-07 MED ORDER — VALSARTAN 320 MG PO TABS: 320.0000 mg | ORAL_TABLET | Freq: Every day | ORAL | 0 refills | Status: DC

## 2016-03-07 NOTE — Telephone Encounter (Signed)
Rec'd call pt states she is needing a short term rx on her BP med called [facility]. Verified which med sent 2 wk supply on Valsartan...Janet Le

## 2016-03-08 ENCOUNTER — Other Ambulatory Visit: Payer: Self-pay | Admitting: Emergency Medicine

## 2016-03-08 MED ORDER — ALENDRONATE SODIUM 70 MG PO TABS: 70.0000 mg | ORAL_TABLET | ORAL | 3 refills | Status: DC

## 2016-03-08 MED ORDER — FUROSEMIDE 20 MG PO TABS: 20.0000 mg | ORAL_TABLET | Freq: Every day | ORAL | 1 refills | Status: DC

## 2016-03-08 MED ORDER — ATORVASTATIN CALCIUM 10 MG PO TABS: 10.0000 mg | ORAL_TABLET | Freq: Every day | ORAL | 1 refills | Status: DC

## 2016-04-18 ENCOUNTER — Other Ambulatory Visit: Payer: Self-pay | Admitting: Hematology

## 2016-04-19 ENCOUNTER — Other Ambulatory Visit: Payer: Self-pay | Admitting: *Deleted

## 2016-04-19 DIAGNOSIS — D696 Thrombocytopenia, unspecified: Secondary | ICD-10-CM

## 2016-04-19 MED ORDER — PNV PRENATAL PLUS MULTIVITAMIN 27-1 MG PO TABS: ORAL_TABLET | ORAL | 0 refills | Status: DC

## 2016-05-30 ENCOUNTER — Ambulatory Visit (INDEPENDENT_AMBULATORY_CARE_PROVIDER_SITE_OTHER): Payer: BLUE CROSS/BLUE SHIELD | Admitting: Internal Medicine

## 2016-05-30 ENCOUNTER — Other Ambulatory Visit (INDEPENDENT_AMBULATORY_CARE_PROVIDER_SITE_OTHER): Payer: BLUE CROSS/BLUE SHIELD

## 2016-05-30 ENCOUNTER — Encounter: Payer: Self-pay | Admitting: Internal Medicine

## 2016-05-30 VITALS — BP 114/80 | HR 73 | Temp 98.5°F | Resp 16 | Ht 62.0 in | Wt 226.0 lb

## 2016-05-30 DIAGNOSIS — I890 Lymphedema, not elsewhere classified: Secondary | ICD-10-CM

## 2016-05-30 DIAGNOSIS — R7303 Prediabetes: Secondary | ICD-10-CM | POA: Diagnosis not present

## 2016-05-30 DIAGNOSIS — Z0000 Encounter for general adult medical examination without abnormal findings: Secondary | ICD-10-CM

## 2016-05-30 DIAGNOSIS — I10 Essential (primary) hypertension: Secondary | ICD-10-CM | POA: Diagnosis not present

## 2016-05-30 DIAGNOSIS — Z23 Encounter for immunization: Secondary | ICD-10-CM

## 2016-05-30 DIAGNOSIS — M542 Cervicalgia: Secondary | ICD-10-CM

## 2016-05-30 DIAGNOSIS — D696 Thrombocytopenia, unspecified: Secondary | ICD-10-CM

## 2016-05-30 DIAGNOSIS — E7800 Pure hypercholesterolemia, unspecified: Secondary | ICD-10-CM

## 2016-05-30 DIAGNOSIS — L659 Nonscarring hair loss, unspecified: Secondary | ICD-10-CM

## 2016-05-30 DIAGNOSIS — M25561 Pain in right knee: Secondary | ICD-10-CM

## 2016-05-30 HISTORY — DX: Cervicalgia: M54.2

## 2016-05-30 LAB — CBC WITH DIFFERENTIAL/PLATELET
Basophils Absolute: 0 10*3/uL (ref 0.0–0.1)
Basophils Relative: 0.4 % (ref 0.0–3.0)
Eosinophils Absolute: 0.1 10*3/uL (ref 0.0–0.7)
Eosinophils Relative: 1.7 % (ref 0.0–5.0)
HCT: 38 % (ref 36.0–46.0)
Hemoglobin: 12.7 g/dL (ref 12.0–15.0)
Lymphocytes Relative: 24 % (ref 12.0–46.0)
Lymphs Abs: 2.1 10*3/uL (ref 0.7–4.0)
MCHC: 33.5 g/dL (ref 30.0–36.0)
MCV: 93.6 fl (ref 78.0–100.0)
Monocytes Absolute: 0.6 10*3/uL (ref 0.1–1.0)
Monocytes Relative: 6.8 % (ref 3.0–12.0)
Neutro Abs: 5.8 10*3/uL (ref 1.4–7.7)
Neutrophils Relative %: 67.1 % (ref 43.0–77.0)
Platelets: 132 10*3/uL — ABNORMAL LOW (ref 150.0–400.0)
RBC: 4.06 Mil/uL (ref 3.87–5.11)
RDW: 14.8 % (ref 11.5–15.5)
WBC: 8.6 10*3/uL (ref 4.0–10.5)

## 2016-05-30 LAB — LIPID PANEL
Cholesterol: 132 mg/dL (ref 0–200)
HDL: 55.8 mg/dL (ref >39.00)
LDL Cholesterol: 64 mg/dL (ref 0–99)
NonHDL: 76.43
Total CHOL/HDL Ratio: 2
Triglycerides: 61 mg/dL (ref 0.0–149.0)
VLDL: 12.2 mg/dL (ref 0.0–40.0)

## 2016-05-30 LAB — COMPREHENSIVE METABOLIC PANEL
ALT: 18 U/L (ref 0–35)
AST: 20 U/L (ref 0–37)
Albumin: 4 g/dL (ref 3.5–5.2)
Alkaline Phosphatase: 80 U/L (ref 39–117)
BUN: 11 mg/dL (ref 6–23)
CO2: 32 mEq/L (ref 19–32)
Calcium: 10.1 mg/dL (ref 8.4–10.5)
Chloride: 105 mEq/L (ref 96–112)
Creatinine, Ser: 1 mg/dL (ref 0.40–1.20)
GFR: 72.52 mL/min (ref >60.00)
Glucose, Bld: 87 mg/dL (ref 70–99)
Potassium: 4 mEq/L (ref 3.5–5.1)
Sodium: 141 mEq/L (ref 135–145)
Total Bilirubin: 0.5 mg/dL (ref 0.2–1.2)
Total Protein: 7.4 g/dL (ref 6.0–8.3)

## 2016-05-30 LAB — TSH: TSH: 2.62 u[IU]/mL (ref 0.35–4.50)

## 2016-05-30 LAB — HEMOGLOBIN A1C: Hgb A1c MFr Bld: 5.3 % (ref 4.6–6.5)

## 2016-05-30 NOTE — Assessment & Plan Note (Signed)
Lymphedema controlled with current dose of Lasix Stressed low-sodium diet, regular exercise and weight loss Continue current dose of Lasix

## 2016-05-30 NOTE — Progress Notes (Signed)
Subjective:    Patient ID: Janet Le, female    DOB: 1955/09/28, 60 y.o.   MRN: [id number]  HPI She is here for a physical exam.   She has no concerns.  She denies changes in her history or family history.  She has increased stress. She lost her brother one year ago, her cousin died last month, her daughter moved and she was taking care of her children - They were her clients through work. She currently has no plans and therefore no income. She also misses her grandchildren.     She is exercising 2/week.  She is eating fairly good.   Medications and allergies reviewed with patient and updated if appropriate.  Patient Active Problem List   Diagnosis Date Noted  . Prediabetes 05/30/2016  . Osteopenia 11/18/2015  . Cephalalgia 07/20/2015  . Thrombocytopenia ([place]) 05/30/2015  . Arthritis of left lower extremity 02/02/2015  . Left knee pain 09/08/2014  . Gout 12/11/2013  . Vision disturbance 12/08/2013  . Severe obesity (BMI >= 40) ([place]) 11/06/2013  . Osteoporosis   . Cough 07/15/2012  . Left lumbar radiculopathy 04/13/2012  . Renal cell cancer ([place])   . External hemorrhoid 11/14/2010  . IBS (irritable bowel syndrome) 11/14/2010  . GERD (gastroesophageal reflux disease) 11/14/2010  . Urinary frequency 08/18/2010  . HYPERCHOLESTEROLEMIA, MILD 03/16/2010  . Lymphedema 03/15/2010  . ALLERGIC RHINITIS 08/31/2009  . MIGRAINE HEADACHE 06/14/2009  . SINUSITIS, CHRONIC 06/14/2009  . SYNCOPE 05/16/2009  . Essential hypertension 05/06/2009  . CONSTIPATION, CHRONIC 05/06/2009    Current Outpatient Prescriptions on File Prior to Visit  Medication Sig Dispense Refill  . ACCU-CHEK SOFTCLIX LANCETS lancets by Other route. Use twice a day for blood sugar     . alendronate (FOSAMAX) 70 MG tablet Take 1 tablet (70 mg total) by mouth once a week. Take with a full glass of water on an empty stomach. 12 tablet 3  . amLODipine (NORVASC) 10 MG tablet Take 1 tablet (10 mg total) by mouth  daily. 90 tablet 3  . atorvastatin (LIPITOR) 10 MG tablet Take 1 tablet (10 mg total) by mouth daily. 90 tablet 1  . Diclofenac Sodium 2 % SOLN Apply 1 pump twice daily. 112 g 1  . fluticasone (FLONASE) 50 MCG/ACT nasal spray Place 1 spray into both nostrils daily. 16 g 2  . furosemide (LASIX) 20 MG tablet Take 1 tablet (20 mg total) by mouth daily. 90 tablet 1  . glucose blood (ONETOUCH VERIO) test strip Use one strip to check blood sugar daily 100 each 4  . Insulin Pen Needle (NOVOFINE) 32G X 6 MM MISC Use daily with Saxenda 100 each 1  . Insulin Pen Needle 32G X 5 MM MISC Use as directed once daily for saxenda 30 each 3  . Linaclotide (LINZESS) 145 MCG CAPS capsule Take 1 capsule (145 mcg total) by mouth daily. 90 capsule 1  . Liraglutide -Weight Management (SAXENDA) 18 MG/3ML SOPN Inject 3 mg into the skin daily. 15 pen 3  . naproxen (NAPROSYN) 500 MG tablet Take 1 tablet (500 mg total) by mouth 2 (two) times daily with a meal. 60 tablet 0  . ONETOUCH DELICA LANCETS 99991111 MISC 1 each by Does not apply route daily. Use to help check blood sugars once daily Dx 250.00 100 each 3  . Prenatal Vit-Fe Fumarate-FA (PNV PRENATAL PLUS MULTIVITAMIN) 27-1 MG TABS TAKE 1 TABLET DAILY AT 12 NOON 60 tablet 0  . propranolol (INDERAL) 80 MG  tablet Take 1 tablet (80 mg total) by mouth 2 (two) times daily. 28 tablet 0  . traMADol-acetaminophen (ULTRACET) 37.5-325 MG per tablet Take 1 tablet by mouth every 8 (eight) hours as needed. 90 tablet 1  . valsartan (DIOVAN) 320 MG tablet Take 1 tablet (320 mg total) by mouth daily. 10 tablet 0  . calcium-vitamin D (OSCAL WITH D) 500-200 MG-UNIT tablet Take 1 tablet by mouth.     No current facility-administered medications on file prior to visit.     Past Medical History:  Diagnosis Date  . Allergic rhinitis, cause unspecified   . Chronic constipation   . Chronic sinusitis   . Endometriosis   . ENDOMETRIOSIS 05/06/2009   Qualifier: Diagnosis of  By: Varney Daily RN,  Butch Penny    . Esophageal stricture   . GERD (gastroesophageal reflux disease)   . Hemorrhoids   . Hiatal hernia   . Hiatal hernia   . Hyperlipidemia   . Hypertension   . Irritable bowel syndrome   . Osteoarthritis   . Personal history of diabetes mellitus    controlled with diet only  . Renal cell cancer ([place]) 11/2010 dx   R, s/p cryoablation 02/16/11  . Syncopal episodes    since childhood    Past Surgical History:  Procedure Laterality Date  . BREAST SURGERY     bilateral  . fallopian tube removed    . FUNCTIONAL ENDOSCOPIC SINUS SURGERY    . LASER ABLATION OF THE CERVIX    . 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 TUBAL LIGATION      Social History   Social History  . Marital status: Married    Spouse name: N/A  . Number of children: N/A  . Years of education: N/A   Occupational History  . 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 Smoking status: Never Smoker  . Smokeless tobacco: Never Used  . Alcohol use No     Comment: rare  . Drug use: No  . Sexual activity: No   Other Topics Concern  . Not on file   Social History Narrative  . No narrative on file    Family History  Problem Relation Age of Onset  . Diabetes Mother   . Kidney disease Father   . Prostate cancer Father   . Colitis Brother   . Leukemia Brother   . Multiple sclerosis Sister   . Colon polyps    . Colon cancer Neg Hx     Review of Systems  Constitutional: Negative for appetite change, chills, fatigue, fever and unexpected weight change.  HENT: Positive for postnasal drip. Negative for sore throat.   Eyes: Positive for visual disturbance (difficulty driving at night - the lights).  Respiratory: Positive for cough (1-2 times a day). Negative for shortness of breath and wheezing.   Cardiovascular: Positive for leg swelling (chronic -at baseline). Negative for chest pain and palpitations.    Gastrointestinal: Positive for constipation. Negative for abdominal pain, blood in stool, diarrhea and nausea.       Gerd at times  Genitourinary: Negative for dysuria and hematuria.  Musculoskeletal: Positive for arthralgias (knees, shoulder) and back pain.  Skin: Positive for color change (areas of white skin). Negative for rash.       Hair is thinning  Neurological: Positive for dizziness (occ). Negative for light-headedness and headaches.  Psychiatric/Behavioral: Positive for  dysphoric mood. The patient is nervous/anxious.        Objective:   Vitals:   05/30/16 1037  BP: 114/80  Pulse: 73  Resp: 16  Temp: 98.5 F (36.9 C)   Filed Weights   05/30/16 1037  Weight: 226 lb (102.5 kg)   Body mass index is 41.34 kg/m.   Physical Exam Constitutional: She appears well-developed and well-nourished. No distress.  HENT:  Head: Normocephalic and atraumatic.  Right Ear: External ear normal. Normal ear canal and TM Left Ear: External ear normal.  Normal ear canal and TM Mouth/Throat: Oropharynx is clear and moist.  Eyes: Conjunctivae and EOM are normal.  Neck: Neck supple. No tracheal deviation present. No thyromegaly present.  No carotid bruit  Cardiovascular: Normal rate, regular rhythm and normal heart sounds.   No murmur heard.  No edema. Pulmonary/Chest: Effort normal and breath sounds normal. No respiratory distress. She has no wheezes. She has no rales.  Breast: deferred to Gyn Abdominal: Soft. She exhibits no distension. There is no tenderness.  Lymphadenopathy: She has no cervical adenopathy.  Skin: Skin is warm and dry. She is not diaphoretic.  here is in pretty, but there is some better soft on the top of her head-no focal hair loss  Psychiatric: She has a normal mood and affect. Her behavior is normal.         Assessment & Plan:   Physical exam: Screening blood work  ordered Immunizations  Flu vaccine today, discussed shingles Colonoscopy  Up to date    Mammogram  Up-to-date Gyn  up-to-date Eye exams  -  Up to date - will schedule another appt due to ? Cataracts Exercise - exercising, but should exercise more Weight - work ing weight loss Skin - no concerns Substance abuse  none  See Problem List for Assessment and Plan of chronic medical problems.  Follow-up in 6 months

## 2016-05-30 NOTE — Assessment & Plan Note (Signed)
Would like to have this further evaluated-was told it was osteoarthritis in the past Refer to orthopedics

## 2016-05-30 NOTE — Assessment & Plan Note (Signed)
Thinning of the hair diffusely Has seen dermatology in the past and given no explanation We'll refer to a different dermatologist for a second opinion

## 2016-05-30 NOTE — Assessment & Plan Note (Signed)
Check lipid panel  Continue daily statin Regular exercise and healthy diet encouraged  

## 2016-05-30 NOTE — Assessment & Plan Note (Signed)
BP well controlled Current regimen effective and well tolerated Continue current medications at current doses  

## 2016-05-30 NOTE — Assessment & Plan Note (Signed)
Check A1c Continue to work on weight loss Regular exercise stressed

## 2016-05-30 NOTE — Progress Notes (Signed)
Pre visit review using our clinic review tool, if applicable. No additional management support is needed unless otherwise documented below in the visit note. 

## 2016-05-30 NOTE — Assessment & Plan Note (Signed)
Chronic neck pain that radiates to the shoulders and upper chest Would like to have this evaluated further Refer to orthopedics

## 2016-05-30 NOTE — Patient Instructions (Signed)
  Test(s) ordered today. Your results will be released to [place] (or called to you) after review, usually within 72hours after test completion. If any changes need to be made, you will be notified at that same time.  All other Health Maintenance issues reviewed.   All recommended immunizations and age-appropriate screenings are up-to-date or discussed.  flu immunization administered today.   Medications reviewed and updated.   No changes recommended at this time.  Your prescription(s) have been submitted to your pharmacy. Please take as directed and contact our office if you believe you are having problem(s) with the medication(s).  A referral was ordered for derm and orthopedics.  Please followup in 6 months

## 2016-05-30 NOTE — Assessment & Plan Note (Signed)
Check CBC 

## 2016-05-30 NOTE — Assessment & Plan Note (Signed)
Slow weight loss with saxenda - will continue Stressed exercise, health diet and decreased portions

## 2016-05-31 ENCOUNTER — Encounter: Payer: Self-pay | Admitting: Internal Medicine

## 2016-06-15 ENCOUNTER — Ambulatory Visit (INDEPENDENT_AMBULATORY_CARE_PROVIDER_SITE_OTHER): Payer: Self-pay | Admitting: Orthopedic Surgery

## 2016-06-22 ENCOUNTER — Other Ambulatory Visit: Payer: Self-pay | Admitting: *Deleted

## 2016-06-22 DIAGNOSIS — D696 Thrombocytopenia, unspecified: Secondary | ICD-10-CM

## 2016-06-22 MED ORDER — PNV PRENATAL PLUS MULTIVITAMIN 27-1 MG PO TABS: ORAL_TABLET | ORAL | 1 refills | Status: DC

## 2016-06-29 ENCOUNTER — Telehealth: Payer: Self-pay | Admitting: Emergency Medicine

## 2016-06-29 NOTE — Telephone Encounter (Signed)
Received fax from pharmacy requesting a PA on saxenda. Please advise if something else can be sent in.

## 2016-06-30 NOTE — Telephone Encounter (Signed)
Call her - let her know her insurance does not want to pay for saxenda.  See if she wants to try something else.  She should consider calling her insurance company to see what they will cover.  She may or may not qualify for them clinically.     Weight loss to date: 1/24 - 252 2/28 - 244 6/5 - 228 12/6 - 226

## 2016-07-02 NOTE — Telephone Encounter (Signed)
Spoke with pt to inform. She is going to contact her insurance company and give [place] a call back.

## 2016-07-03 ENCOUNTER — Other Ambulatory Visit: Payer: Self-pay | Admitting: Internal Medicine

## 2016-07-03 NOTE — Telephone Encounter (Signed)
Pt request to speak to the assistant concern about PA for Contrave (phone # to start [place] [phone]). Please help

## 2016-07-03 NOTE — Telephone Encounter (Signed)
Pt spoke with her insurance and states they told her contrave could be sent rather than Saxenda. Please sent to [facility]

## 2016-07-04 NOTE — Telephone Encounter (Signed)
Spoke with pt, she states that she has not been taking the Tramadol.

## 2016-07-04 NOTE — Telephone Encounter (Signed)
Is she currently taking the tramadol?  If so how often?

## 2016-07-04 NOTE — Telephone Encounter (Signed)
contrave has wellbutrin in it - she was on this in the past and it cause numbness of the mouth/lips - does she want to try this?   If she does medication is pending -ok to send.  She should f/u with me in one month.

## 2016-07-04 NOTE — Telephone Encounter (Signed)
LVM for pt to call back and discuss.  

## 2016-07-05 ENCOUNTER — Ambulatory Visit (INDEPENDENT_AMBULATORY_CARE_PROVIDER_SITE_OTHER): Payer: Self-pay

## 2016-07-05 ENCOUNTER — Encounter (INDEPENDENT_AMBULATORY_CARE_PROVIDER_SITE_OTHER): Payer: Self-pay

## 2016-07-05 ENCOUNTER — Encounter (INDEPENDENT_AMBULATORY_CARE_PROVIDER_SITE_OTHER): Payer: Self-pay | Admitting: Orthopedic Surgery

## 2016-07-05 ENCOUNTER — Ambulatory Visit (INDEPENDENT_AMBULATORY_CARE_PROVIDER_SITE_OTHER): Payer: BLUE CROSS/BLUE SHIELD | Admitting: Orthopedic Surgery

## 2016-07-05 DIAGNOSIS — M25561 Pain in right knee: Secondary | ICD-10-CM

## 2016-07-05 DIAGNOSIS — M545 Low back pain: Secondary | ICD-10-CM

## 2016-07-05 DIAGNOSIS — M25562 Pain in left knee: Secondary | ICD-10-CM

## 2016-07-05 DIAGNOSIS — G8929 Other chronic pain: Secondary | ICD-10-CM

## 2016-07-05 DIAGNOSIS — M542 Cervicalgia: Secondary | ICD-10-CM

## 2016-07-10 ENCOUNTER — Telehealth: Payer: Self-pay | Admitting: Internal Medicine

## 2016-07-10 NOTE — Telephone Encounter (Signed)
Spoke with pt yesterday, she does not want to try this medication due to the wellbutrin, please advise if there is another option.

## 2016-07-10 NOTE — Telephone Encounter (Signed)
saxenda is not covered.  Had side effect to wellbutrin so contrave is not an option.   Phentermine is not a good option because it can only be taken for three months and that will not be long enough to help with weight loss.   I would recommend she call her insurance company and see what is covered.

## 2016-07-13 MED ORDER — VALSARTAN 320 MG PO TABS: 320.0000 mg | ORAL_TABLET | Freq: Every day | ORAL | 3 refills | Status: DC

## 2016-07-13 MED ORDER — AMLODIPINE BESYLATE 10 MG PO TABS
10.0000 mg | ORAL_TABLET | Freq: Every day | ORAL | 3 refills | Status: DC
Start: 2016-07-13 — End: 2016-11-29

## 2016-07-13 NOTE — Telephone Encounter (Signed)
Patient is requesting refill on amlodipine and val[facility]  Also would like to know if a PA was received for a medication starts sexan?  States she has spoken to [place] in regard?

## 2016-07-13 NOTE — Telephone Encounter (Signed)
Rx's sent in.  PA in progress

## 2016-07-14 NOTE — Telephone Encounter (Signed)
PA fax completed sent by [facility] and faxed back. Awaiting response. Tried notifying pt, no answer.

## 2016-07-16 NOTE — Telephone Encounter (Signed)
Spoke with pt to inform PA is in progress and we will contact her when we receive a response.

## 2016-07-17 MED ORDER — LIRAGLUTIDE -WEIGHT MANAGEMENT 18 MG/3ML ~~LOC~~ SOPN: 0.6000 mg | PEN_INJECTOR | Freq: Every day | SUBCUTANEOUS | 1 refills | Status: DC

## 2016-07-17 MED ORDER — INSULIN PEN NEEDLE 32G X 6 MM MISC: 1 refills | Status: DC

## 2016-07-17 NOTE — Telephone Encounter (Signed)
Janet Le has been approved from 06-17-16 through 07-17-17. Please send in medication to [facility]. Not on current med list right now. Her last dose was 3mg .

## 2016-07-17 NOTE — Telephone Encounter (Signed)
rx sent [facility]

## 2016-07-18 ENCOUNTER — Ambulatory Visit (INDEPENDENT_AMBULATORY_CARE_PROVIDER_SITE_OTHER): Payer: BLUE CROSS/BLUE SHIELD | Admitting: Orthopedic Surgery

## 2016-07-18 ENCOUNTER — Encounter (INDEPENDENT_AMBULATORY_CARE_PROVIDER_SITE_OTHER): Payer: Self-pay | Admitting: Orthopedic Surgery

## 2016-07-18 DIAGNOSIS — M542 Cervicalgia: Secondary | ICD-10-CM | POA: Diagnosis not present

## 2016-07-18 DIAGNOSIS — M25561 Pain in right knee: Secondary | ICD-10-CM

## 2016-07-18 DIAGNOSIS — M545 Low back pain, unspecified: Secondary | ICD-10-CM

## 2016-07-18 DIAGNOSIS — M25562 Pain in left knee: Secondary | ICD-10-CM

## 2016-07-18 DIAGNOSIS — G8929 Other chronic pain: Secondary | ICD-10-CM

## 2016-07-18 HISTORY — DX: Other chronic pain: G89.29

## 2016-07-18 HISTORY — DX: Low back pain, unspecified: M54.50
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
## 2016-07-19 NOTE — Progress Notes (Signed)
Patient is not seen today

## 2016-07-27 ENCOUNTER — Ambulatory Visit
Admission: RE | Admit: 2016-07-27 | Discharge: 2016-07-27 | Disposition: A | Discharge status: Home / Self Care | Payer: BLUE CROSS/BLUE SHIELD | Source: Ambulatory Visit | Attending: Orthopedic Surgery | Admitting: Orthopedic Surgery

## 2016-07-27 ENCOUNTER — Encounter (HOSPITAL_COMMUNITY): Payer: Self-pay | Admitting: Emergency Medicine

## 2016-07-27 ENCOUNTER — Ambulatory Visit (HOSPITAL_COMMUNITY)
Admission: EM | Admit: 2016-07-27 | Discharge: 2016-07-27 | Disposition: A | Discharge status: Home / Self Care | Payer: BLUE CROSS/BLUE SHIELD | Attending: Family Medicine | Admitting: Family Medicine

## 2016-07-27 DIAGNOSIS — M25561 Pain in right knee: Principal | ICD-10-CM

## 2016-07-27 DIAGNOSIS — G8929 Other chronic pain: Secondary | ICD-10-CM

## 2016-07-27 DIAGNOSIS — D1801 Hemangioma of skin and subcutaneous tissue: Secondary | ICD-10-CM

## 2016-07-27 DIAGNOSIS — M545 Low back pain: Principal | ICD-10-CM

## 2016-07-27 DIAGNOSIS — M5126 Other intervertebral disc displacement, lumbar region: Secondary | ICD-10-CM | POA: Diagnosis not present

## 2016-07-27 MED ORDER — SILVER NITRATE-POT NITRATE 75-25 % EX MISC
CUTANEOUS | Status: AC
  Filled 2016-07-27: qty 1

## 2016-07-27 MED ORDER — NAPROXEN 250 MG PO TABS: 250.0000 mg | ORAL_TABLET | Freq: Two times a day (BID) | ORAL | 0 refills | Status: DC

## 2016-07-27 NOTE — Discharge Instructions (Signed)
This appears to be a small lesion consisting primarily of blood vessels. It is called a hemangioma based on the initial examination. This may need to be removed. Call the podiatrist listed above for an appointment on Monday. Keep the current dressing on for 24 hours before changing. It may continue to bleed so just keep putting dressings on top of the toe as needed. Try not to bump it or scrape it.

## 2016-07-27 NOTE — ED Provider Notes (Signed)
CSN: [id number]     Arrival date & time 07/27/16  1228 History   First MD Initiated Contact with Patient 07/27/16 1448     Chief Complaint  Patient presents with  . Toe Injury   (Consider location/radiation/quality/duration/timing/severity/associated sxs/prior Treatment) 61 year old female noticed that there was some loose skin on the tip of her right third toe about 5 days ago when she started picking and pulling and it. Today she noticed that the tissue seems to be getting larger and it is bleeding. She presents with a bandage on the right third toe. When she pulled the bandage off it immediately started bleeding.      Past Medical History:  Diagnosis Date  . Allergic rhinitis, cause unspecified   . Chronic constipation   . Chronic sinusitis   . Endometriosis   . ENDOMETRIOSIS 05/06/2009   Qualifier: Diagnosis of  By: Varney Daily RN, Butch Penny    . Esophageal stricture   . GERD (gastroesophageal reflux disease)   . Hemorrhoids   . Hiatal hernia   . Hiatal hernia   . Hyperlipidemia   . Hypertension   . Irritable bowel syndrome   . Osteoarthritis   . Personal history of diabetes mellitus    controlled with diet only  . Renal cell cancer ([place]) 11/2010 dx   R, s/p cryoablation 02/16/11  . Syncopal episodes    since childhood   Past Surgical History:  Procedure Laterality Date  . BREAST SURGERY     bilateral  . fallopian tube removed    . FUNCTIONAL ENDOSCOPIC SINUS SURGERY    . LASER ABLATION OF THE CERVIX    . 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 TUBAL LIGATION     Family History  Problem Relation Age of Onset  . Diabetes Mother   . Kidney disease Father   . Prostate cancer Father   . Colitis Brother   . Leukemia Brother   . Multiple sclerosis Sister   . Colon polyps    . Colon cancer Neg Hx    Social History  Substance Use Topics  . Smoking status: Never Smoker  . Smokeless tobacco: Never Used  . Alcohol use No     Comment: rare    OB History    No data available     Review of Systems  Constitutional: Negative.   Skin:       Lesion to the tip of the right third toe, friable and bleeding.  All other systems reviewed and are negative.   Allergies  Sertraline hcl; Ace inhibitors; Codeine; Darifenacin hydrobromide er; Gabapentin; Pravastatin; Propoxyphene hcl; and Wellbutrin [bupropion hcl]  Home Medications   Prior to Admission medications   Medication Sig Start Date End Date Taking? Authorizing Provider  alendronate (FOSAMAX) 70 MG tablet Take 1 tablet (70 mg total) by mouth once a week. Take with a full glass of water on an empty stomach. 03/08/16  Yes Binnie Rail, MD  amLODipine (NORVASC) 10 MG tablet Take 1 tablet (10 mg total) by mouth daily. 07/13/16  Yes Binnie Rail, MD  atorvastatin (LIPITOR) 10 MG tablet Take 1 tablet (10 mg total) by mouth daily. 03/08/16  Yes Binnie Rail, MD  atorvastatin (LIPITOR) 10 MG tablet TAKE 1 TABLET DAILY 07/10/16  Yes Binnie Rail, MD  calcium-vitamin D (OSCAL WITH D) 500-200 MG-UNIT tablet Take 1 tablet by mouth.   Yes Historical Provider, MD  furosemide (LASIX) 20 MG tablet Take  1 tablet (20 mg total) by mouth daily. 03/08/16  Yes Binnie Rail, MD  glucose blood (ONETOUCH VERIO) test strip Use one strip to check blood sugar daily 05/31/15  Yes Binnie Rail, MD  Insulin Pen Needle (NOVOFINE) 32G X 6 MM MISC Use daily with Saxenda 07/17/16  Yes Binnie Rail, MD  Liraglutide -Weight Management (SAXENDA) 18 MG/3ML SOPN Inject 0.6 mg into the skin daily. For one week, then increase 0.6 mg weekly until you reach 3 mg daily 07/17/16  Yes Binnie Rail, MD  Prenatal Vit-Fe Fumarate-FA (PNV PRENATAL PLUS MULTIVITAMIN) 27-1 MG TABS TAKE 1 TABLET DAILY AT 12 NOON 06/22/16  Yes Truitt Merle, MD  propranolol (INDERAL) 80 MG tablet Take 1 tablet (80 mg total) by mouth 2 (two) times daily. 01/03/16  Yes Binnie Rail, MD  valsartan (DIOVAN) 320 MG tablet Take 1 tablet (320 mg total) by mouth  daily. 07/13/16  Yes Binnie Rail, MD  ACCU-CHEK SOFTCLIX LANCETS lancets by Other route. Use twice a day for blood sugar     Historical Provider, MD  Diclofenac Sodium 2 % SOLN Apply 1 pump twice daily. 08/22/15   Lyndal Pulley, DO  fluticasone (FLONASE) 50 MCG/ACT nasal spray Place 1 spray into both nostrils daily. 01/05/15   Rowe Clack, MD  Linaclotide Rolan Lipa) 145 MCG CAPS capsule Take 1 capsule (145 mcg total) by mouth daily. 05/31/15   Jerene Bears, MD  naproxen (NAPROSYN) 500 MG tablet Take 1 tablet (500 mg total) by mouth 2 (two) times daily with a meal. 09/08/14   Golden Circle, FNP  [facility] DELICA LANCETS [id number] MISC 1 each by Does not apply route daily. Use to help check blood sugars once daily Dx 250.00 11/23/13   Rowe Clack, MD   Meds Ordered and Administered this Visit  Medications - No data to display  BP 130/73 (BP Location: Left Arm)   Pulse 68   Temp 98 F (36.7 C) (Oral)   Resp 20   SpO2 100%  No data found.   Physical Exam  Constitutional: She is oriented to person, place, and time. She appears well-developed and well-nourished. No distress.  Eyes: EOM are normal.  Neck: Normal range of motion. Neck supple.  Cardiovascular: Normal rate.   Pulmonary/Chest: Effort normal. No respiratory distress.  Musculoskeletal: She exhibits no edema.  Neurological: She is alert and oriented to person, place, and time. She exhibits normal muscle tone.  Skin: Skin is warm and dry.  There is a mushroom like lesion measuring approximately 3 mm across arising from the tip of the right third toe. It is actively bleeding. There is no overlying skin. It is red and vascular. Surrounding skin seems to be unaffected. The associated nail with this toe is deformed secondary to chronic onychomycosis.  Psychiatric: She has a normal mood and affect.  Nursing note and vitals reviewed.   Urgent Care Course   using 2 silver nitrate sticks hemostasis was controlled. A sterile 2 x 2  dressing was then applied to cover the lesion.  Procedures (including critical care time)  Labs Review Labs Reviewed - No data to display  Imaging Review Mr Lumbar Spine W/o Contrast  Result Date: 07/27/2016 CLINICAL DATA:  L5-S1 spondylolisthesis. Low back pain radiating into the knee is. EXAM: MRI LUMBAR SPINE WITHOUT CONTRAST TECHNIQUE: Multiplanar, multisequence MR imaging of the lumbar spine was performed. No intravenous contrast was administered. COMPARISON:  Lumbar spine radiographs 07/05/2016 FINDINGS: Segmentation:  Standard. Alignment: Grade 1 anterolisthesis of L5 on S1 appears facet mediated and measures approximately 5 mm. Vertebrae: No evidence of fracture, osseous lesion, or significant marrow edema. Conus medullaris: Extends to the L1 level and appears normal. Paraspinal and other soft tissues: Unremarkable. Disc levels: T12-L1 through L2-3:  Negative. L3-4: Mild disc desiccation. Mild disc bulging and moderate facet arthrosis result in minimal left neural foraminal stenosis without spinal stenosis. Small right and small to moderate left facet joint effusions. L4-5: Disc desiccation and mild disc space narrowing. Mild disc bulging and advanced facet arthrosis result in mild bilateral lateral recess narrowing and mild bilateral neural foraminal stenosis without spinal stenosis. Gaping facet joints bilaterally with large effusions as can be seen in the setting of dynamic instability. Grade 1 anterolisthesis of L4 on L5 on upright lumbar spine radiographs is not present with the patient in the supine position for this MRI. L5-S1: Axial images were inadvertently not obtained through this level. Disc desiccation and moderate disc space narrowing with evidence of vacuum disc. Listhesis with mild bulging of uncovered disc and advanced facet arthrosis result in mild-to-moderate bilateral neural foraminal stenosis. No evidence of significant spinal stenosis on the sagittal images. IMPRESSION: 1.  Advanced L5-S1 facet arthrosis with grade 1 anterolisthesis and mild-to-moderate bilateral foraminal stenosis. No significant spinal stenosis. Axial images were inadvertently not obtained through this level. If having these images would affect clinical management, we would be happy for the patient to return for those images to be added to this study. 2. Advanced L4-5 facet arthrosis with mild bilateral lateral recess and neural foraminal stenosis. Stenosis may worsen with standing as there is grade 1 anterolisthesis on upright radiographs which is not present with the patient in the supine position. 3. Mild disc and moderate facet degeneration at L3-4 without significant stenosis. Electronically Signed   By: Logan Bores M.D.   On: 07/27/2016 13:27   Mr Knee Right W/o Contrast  Result Date: 07/27/2016 CLINICAL DATA:  Lateral right knee pain. EXAM: MRI OF THE RIGHT KNEE WITHOUT CONTRAST TECHNIQUE: Multiplanar, multisequence MR imaging of the knee was performed. No intravenous contrast was administered. COMPARISON:  Multiple exams, including 07/05/2016. FINDINGS: MENISCI Medial meniscus: Abnormal degenerative grade 3 signal in the midbody of the medial meniscus compatible with a degenerative free edge tear. Lateral meniscus:  Unremarkable LIGAMENTS Cruciates:  Unremarkable Collaterals:  Unremarkable CARTILAGE Patellofemoral:  Moderate degenerative chondral thinning. Medial: Prominent degenerative chondral thinning with marginal spurring. Lateral:  Mild degenerative chondral thinning Joint: Small knee effusion. 6 mm in long axis focus of synovitis or chondral fragment anteriorly along the lateral portion of the joint, image 14/3. Popliteal Fossa: Moderate pes anserine bursitis. Small Baker's cyst. Extensor Mechanism: Prepatellar subcutaneous edema. Otherwise unremarkable. Bones: No significant extra-articular osseous abnormalities identified. Other: No supplemental non-categorized findings. IMPRESSION: 1.  Degenerative grade 3 free edge tear of the midbody medial meniscus. 2. Variable degree of degenerative chondral thinning, severe in the medial compartment. 3. Small knee effusion with small Baker's cyst. 4. Moderate amount of pes anserine bursitis. 5. Focal synovitis or small chondral fragment anteriorly along the lateral portion of the joint. 6. Prepatellar subcutaneous edema. Electronically Signed   By: Van Clines M.D.   On: 07/27/2016 13:33     Visual Acuity Review  Right Eye Distance:   Left Eye Distance:   Bilateral Distance:    Right Eye Near:   Left Eye Near:    Bilateral Near:         MDM  1. Hemangioma of skin    This appears to be a small lesion consisting primarily of blood vessels. It is called a hemangioma based on the initial examination. This may need to be removed. Call the podiatrist listed above for an appointment on Monday. Keep the current dressing on for 24 hours before changing. It may continue to bleed so just keep putting dressings on top of the toe as needed. Try not to bump it or scrape it. Meds ordered this encounter  Medications  . naproxen (NAPROSYN) 250 MG tablet    Sig: Take 1 tablet (250 mg total) by mouth 2 (two) times daily with a meal. Prn pain    Dispense:  12 tablet    Refill:  0    Order Specific Question:   Supervising Provider    Answer:   Billy Fischer [[id number]]       Janne Napoleon, NP 07/27/16 1514    Janne Napoleon, NP 07/27/16 1521

## 2016-07-27 NOTE — ED Triage Notes (Signed)
Here for right 3rd toe pain onset 6 days  Reports she was pulling "dead" skin and probably pulled too much and now has a wound.   A&O x4... NAD... Slow gait.

## 2016-07-30 ENCOUNTER — Ambulatory Visit (INDEPENDENT_AMBULATORY_CARE_PROVIDER_SITE_OTHER): Payer: BLUE CROSS/BLUE SHIELD | Admitting: Podiatry

## 2016-07-30 ENCOUNTER — Ambulatory Visit (INDEPENDENT_AMBULATORY_CARE_PROVIDER_SITE_OTHER): Payer: BLUE CROSS/BLUE SHIELD

## 2016-07-30 ENCOUNTER — Encounter: Payer: Self-pay | Admitting: Podiatry

## 2016-07-30 DIAGNOSIS — S99929A Unspecified injury of unspecified foot, initial encounter: Secondary | ICD-10-CM

## 2016-07-30 DIAGNOSIS — D1800 Hemangioma unspecified site: Secondary | ICD-10-CM

## 2016-07-30 DIAGNOSIS — L089 Local infection of the skin and subcutaneous tissue, unspecified: Secondary | ICD-10-CM | POA: Diagnosis not present

## 2016-07-30 DIAGNOSIS — L905 Scar conditions and fibrosis of skin: Secondary | ICD-10-CM | POA: Diagnosis not present

## 2016-07-30 DIAGNOSIS — L818 Other specified disorders of pigmentation: Secondary | ICD-10-CM | POA: Diagnosis not present

## 2016-07-31 ENCOUNTER — Telehealth: Payer: Self-pay | Admitting: *Deleted

## 2016-07-31 NOTE — Telephone Encounter (Addendum)
Pt states Dr. Amalia Hailey took something out of he toe and when she was changing the dressing there still was something in the hole of the toe. I spoke with pt and she states the thing Dr. Amalia Hailey took out yesterday there is another sticking out today. I told pt to cover the area with a dressing and I would inform Dr. Amalia Hailey.09/26/2016-DrAmalia Hailey states pt's liver function test is within normal limits and may continue lamisil to completion and reappt in 4 months. I informed pt and transferred to schedulers.

## 2016-08-01 ENCOUNTER — Ambulatory Visit (INDEPENDENT_AMBULATORY_CARE_PROVIDER_SITE_OTHER): Payer: BLUE CROSS/BLUE SHIELD | Admitting: Podiatry

## 2016-08-01 ENCOUNTER — Ambulatory Visit (INDEPENDENT_AMBULATORY_CARE_PROVIDER_SITE_OTHER): Payer: BLUE CROSS/BLUE SHIELD | Admitting: Orthopedic Surgery

## 2016-08-01 ENCOUNTER — Encounter (INDEPENDENT_AMBULATORY_CARE_PROVIDER_SITE_OTHER): Payer: Self-pay | Admitting: Orthopedic Surgery

## 2016-08-01 ENCOUNTER — Encounter: Payer: Self-pay | Admitting: Podiatry

## 2016-08-01 VITALS — BP 128/72 | HR 71 | Resp 16

## 2016-08-01 DIAGNOSIS — M545 Low back pain, unspecified: Secondary | ICD-10-CM | POA: Insufficient documentation

## 2016-08-01 DIAGNOSIS — D1800 Hemangioma unspecified site: Secondary | ICD-10-CM

## 2016-08-01 DIAGNOSIS — S99929A Unspecified injury of unspecified foot, initial encounter: Secondary | ICD-10-CM | POA: Diagnosis not present

## 2016-08-01 DIAGNOSIS — M25561 Pain in right knee: Secondary | ICD-10-CM

## 2016-08-01 NOTE — Progress Notes (Signed)
Office Visit Note   Patient: Janet Le           Date of Birth: February 27, 1956           MRN: [id number] Visit Date: 08/01/2016 Requested by: Binnie Rail, MD [place], [place] [address] PCP: Binnie Rail, MD  Subjective: Chief Complaint  Patient presents with  . Lower Back - Pain  . Right Knee - Pain    HPI [name] is a 61 year old patient with right knee pain and low back pain.  The subcutaneous she's had MRI scans of both.  She's having the same symptoms that she reported before which is weightbearing pain in the knee as well as focal back pain which occasionally radiates into the buttock region.  Her MRI scan is reviewed and she has pretty significant medial compartment arthritis along with a degenerative meniscal tear.  Her back shows spondylolisthesis and facet arthritis.  The spondylolisthesis is only about 2-3 mm.  The scans are reviewed with the patient and the pathology is discussed and demonstrated.              Review of Systems All systems reviewed are negative as they relate to the chief complaint within the history of present illness.  Patient denies  fevers or chills.    Assessment & Plan: Visit Diagnoses:  1. Right knee pain, unspecified chronicity   2. Low back pain, unspecified back pain laterality, unspecified chronicity, with sciatica presence unspecified     Plan: Impression is right knee arthritis with degenerative meniscal tear in the setting of pre-significant degenerative joint disease.  I gave her 3 options which would be continued observation arthroscopy or knee replacement.  She does have some mild patellofemoral wear and numbness whether not she is a candidate for partial knee replacement is difficult to say but probably unlikely.  In regards to arthroscopy and meniscal debridements I told her that she might get some relief she may get no relief and there is a potential that it might be worse after surgery.  Again if she had no degenerative  changes in that knee joint I would favor arthroscopic evaluation prior to knee replacement; however she is fairly adamant about considering knee replacement so that she can get on with her life due to the amount of pain she is having.  She is having somewhat limited mechanical symptoms but mostly pain in that knee.  In regards to the back she is also having some back pain.  This does not look like a surgical problem at this time.  I would favor epidural steroid injections with possible ablation of the facet joint nerves if she gets relief from the injections.  I'll send her to Dr. Ernestina Patches for consideration of that possibility.  Follow-Up Instructions: No Follow-up on file.   Orders:  No orders of the defined types were placed in this encounter.  No orders of the defined types were placed in this encounter.     Procedures: No procedures performed   Clinical Data: No additional findings.  Objective: Vital Signs: There were no vitals taken for this visit.  Physical Exam   Constitutional: Patient appears well-developed HEENT:  Head: Normocephalic Eyes:EOM are normal Neck: Normal range of motion Cardiovascular: Normal rate Pulmonary/chest: Effort normal Neurologic: Patient is alert Skin: Skin is warm Psychiatric: Patient has normal mood and affect    Ortho Exam examination of the back demonstrates no nerve root tension signs good ankle dorsi flex quite after  strength no paresthesias.  Examination of the right knee demonstrates hyperextension of the knee joint but no effusion medial and lateral joint line tenderness stable collateral crucial ligaments no effusion today  Specialty Comments:  No specialty comments available.  Imaging: No results found.   PMFS History: Patient Active Problem List   Diagnosis Date Noted  . Low back pain 08/01/2016  . Chronic low back pain 07/18/2016  . Prediabetes 05/30/2016  . Cervicalgia 05/30/2016  . Right knee pain 05/30/2016  . Hair  loss 05/30/2016  . Osteopenia 11/18/2015  . Cephalalgia 07/20/2015  . Thrombocytopenia ([place]) 05/30/2015  . Arthritis of left lower extremity 02/02/2015  . Left knee pain 09/08/2014  . Gout 12/11/2013  . Vision disturbance 12/08/2013  . Severe obesity (BMI >= 40) ([place]) 11/06/2013  . Osteoporosis   . Left lumbar radiculopathy 04/13/2012  . Renal cell cancer ([place])   . External hemorrhoid 11/14/2010  . IBS (irritable bowel syndrome) 11/14/2010  . GERD (gastroesophageal reflux disease) 11/14/2010  . HYPERCHOLESTEROLEMIA, MILD 03/16/2010  . Lymphedema 03/15/2010  . ALLERGIC RHINITIS 08/31/2009  . MIGRAINE HEADACHE 06/14/2009  . SINUSITIS, CHRONIC 06/14/2009  . SYNCOPE 05/16/2009  . Essential hypertension 05/06/2009  . CONSTIPATION, CHRONIC 05/06/2009   Past Medical History:  Diagnosis Date  . Allergic rhinitis, cause unspecified   . Chronic constipation   . Chronic sinusitis   . Endometriosis   . ENDOMETRIOSIS 05/06/2009   Qualifier: Diagnosis of  By: Varney Daily RN, Butch Penny    . Esophageal stricture   . GERD (gastroesophageal reflux disease)   . Hemorrhoids   . Hiatal hernia   . Hiatal hernia   . Hyperlipidemia   . Hypertension   . Irritable bowel syndrome   . Osteoarthritis   . Personal history of diabetes mellitus    controlled with diet only  . Renal cell cancer ([place]) 11/2010 dx   R, s/p cryoablation 02/16/11  . Syncopal episodes    since childhood    Family History  Problem Relation Age of Onset  . Diabetes Mother   . Kidney disease Father   . Prostate cancer Father   . Colitis Brother   . Leukemia Brother   . Multiple sclerosis Sister   . Colon polyps    . Colon cancer Neg Hx     Past Surgical History:  Procedure Laterality Date  . BREAST SURGERY     bilateral  . fallopian tube removed    . FUNCTIONAL ENDOSCOPIC SINUS SURGERY    . LASER ABLATION OF THE CERVIX    . 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 TUBAL LIGATION      Social History   Occupational History  . 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 Smoking status: Never Smoker  . Smokeless tobacco: Never Used  . Alcohol use No     Comment: rare  . Drug use: No  . Sexual activity: No

## 2016-08-05 NOTE — Progress Notes (Signed)
   Subjective:  Patient presents today for evaluation of a lesion to the third digit right foot. Patient states that this lesion began as a callus and when she picked it off and begin bleeding profusely. Patient denies trauma. Patient went to the urgent care on 07/27/2016 and was diagnosed with hemangioma. Patient presents today for follow-up treatment and evaluation    Objective/Physical Exam General: The patient is alert and oriented x3 in no acute distress.  Dermatology: Open lesion with a prominent soft tissue mass noted within the lesion of the third digit distal tuft right foot. Upon debridement of the soft tissue mass there was an excessive amount of bleeding noted. Periwound integrity appears to be intact. No cellulitis noted. No malodor. Skin is warm, dry and supple bilateral lower extremities.   Vascular: Palpable pedal pulses bilaterally. No edema or erythema noted. Capillary refill within normal limits.  Neurological: Epicritic and protective threshold grossly intact bilaterally.   Musculoskeletal Exam: Range of motion within normal limits to all pedal and ankle joints bilateral. Muscle strength 5/5 in all groups bilateral.   Radiographic Exam:  Normal osseous mineralization. Joint spaces preserved. No fracture/dislocation/boney destruction.    Assessment: #1 possible hemangioma third digit right foot   Plan of Care:  #1 Patient was evaluated. #2 soft tissue biopsy was taken today and sent to pathology for microscopic exam #3 dry sterile dressing was applied to the lesion ulceration site #4 postoperative shoe dispensed #5 return to clinic in 2 weeks to review pathology   Edrick Kins, DPM [facility]  Dr. Edrick Kins, [address]                                        [place], [place] [address]                Office [phone]  Fax [phone]

## 2016-08-08 DIAGNOSIS — Z01419 Encounter for gynecological examination (general) (routine) without abnormal findings: Secondary | ICD-10-CM | POA: Diagnosis not present

## 2016-08-08 DIAGNOSIS — N760 Acute vaginitis: Secondary | ICD-10-CM | POA: Diagnosis not present

## 2016-08-08 DIAGNOSIS — Z6841 Body Mass Index (BMI) 40.0 and over, adult: Secondary | ICD-10-CM | POA: Diagnosis not present

## 2016-08-13 ENCOUNTER — Ambulatory Visit: Payer: BLUE CROSS/BLUE SHIELD | Admitting: Podiatry

## 2016-08-15 NOTE — Progress Notes (Signed)
   Subjective:  Patient presents today for follow-up evaluation of ulceration of the third digit right foot. Patient is concerned because there is been extra bleeding noted to the ulceration site. Patient presents today for follow-up treatment and evaluation.    Objective/Physical Exam General: The patient is alert and oriented x3 in no acute distress.  Dermatology: Open lesion with a prominent soft tissue mass noted within the lesion of the third digit distal tuft right foot. Upon debridement of the soft tissue mass there was an excessive amount of bleeding noted. Periwound integrity appears to be intact. No cellulitis noted. No malodor. Skin is warm, dry and supple bilateral lower extremities.   Vascular: Palpable pedal pulses bilaterally. No edema or erythema noted. Capillary refill within normal limits.  Neurological: Epicritic and protective threshold grossly intact bilaterally.   Musculoskeletal Exam: Range of motion within normal limits to all pedal and ankle joints bilateral. Muscle strength 5/5 in all groups bilateral.   Radiographic Exam:  Normal osseous mineralization. Joint spaces preserved. No fracture/dislocation/boney destruction.    Assessment: #1 possible hemangioma third digit right foot   Plan of Care:  #1 Patient was evaluated. #2 soft tissue biopsy results still pending  #3 dry sterile dressing was applied to the lesion ulceration site #4 postoperative shoe #5 return to clinic in 2 weeks to review pathology   Edrick Kins, DPM [facility]  Dr. Edrick Kins, [address]                                        [place], [place] [address]                Office [phone]  Fax [phone]

## 2016-08-17 ENCOUNTER — Ambulatory Visit (INDEPENDENT_AMBULATORY_CARE_PROVIDER_SITE_OTHER): Payer: BLUE CROSS/BLUE SHIELD | Admitting: Podiatry

## 2016-08-17 DIAGNOSIS — L603 Nail dystrophy: Secondary | ICD-10-CM | POA: Diagnosis not present

## 2016-08-17 DIAGNOSIS — B351 Tinea unguium: Secondary | ICD-10-CM | POA: Diagnosis not present

## 2016-08-17 DIAGNOSIS — M79609 Pain in unspecified limb: Secondary | ICD-10-CM

## 2016-08-17 DIAGNOSIS — A499 Bacterial infection, unspecified: Secondary | ICD-10-CM | POA: Diagnosis not present

## 2016-08-17 DIAGNOSIS — L980 Pyogenic granuloma: Secondary | ICD-10-CM

## 2016-08-17 DIAGNOSIS — L608 Other nail disorders: Secondary | ICD-10-CM | POA: Diagnosis not present

## 2016-08-17 DIAGNOSIS — D1800 Hemangioma unspecified site: Secondary | ICD-10-CM

## 2016-08-21 NOTE — Progress Notes (Signed)
   Subjective:  Patient presents today for a follow-up treatment and evaluation of a hemangioma to the third digit right foot distal tuft. Patient states she's feeling much better. Patient presents today to review biopsy results and for further treatment and evaluation. Patient also has a new complaint today of possible fungal nails. Patient states that her nails been yellow, discolored and thickened for several years now. Patient denies trauma Patient also presents for further treatment and evaluation    Objective/Physical Exam General: The patient is alert and oriented x3 in no acute distress.  Dermatology: Open lesion with a prominent soft tissue mass noted within the lesion of the third digit distal tuft right foot. Upon debridement of the soft tissue mass there was an excessive amount of bleeding noted. Periwound integrity appears to be intact. No cellulitis noted. No malodor. Skin is warm, dry and supple bilateral lower extremities.   Vascular: Palpable pedal pulses bilaterally. No edema or erythema noted. Capillary refill within normal limits.  Neurological: Epicritic and protective threshold grossly intact bilaterally.   Musculoskeletal Exam: Range of motion within normal limits to all pedal and ankle joints bilateral. Muscle strength 5/5 in all groups bilateral.   Assessment: #1 iatrogenic granuloma/hemangioma third digit right foot #2 onychomycosis  Plan of Care:  #1 Patient was evaluated. #2 soft tissue biopsy results were reviewed today consistent with pyogenic granuloma/hemangioma. No malignancy noted. #3 today nail biopsy was performed for possible fungal nails. #4 return to clinic in 4 weeks to review fungal nail results and discuss treatment options for onychomycosis.  Edrick Kins, DPM [facility]  Dr. Edrick Kins, [address]                                        [place],  [address]                Office [phone]  Fax  [phone]

## 2016-08-31 DIAGNOSIS — E119 Type 2 diabetes mellitus without complications: Secondary | ICD-10-CM | POA: Diagnosis not present

## 2016-08-31 DIAGNOSIS — H11153 Pinguecula, bilateral: Secondary | ICD-10-CM | POA: Diagnosis not present

## 2016-08-31 DIAGNOSIS — H47323 Drusen of optic disc, bilateral: Secondary | ICD-10-CM | POA: Diagnosis not present

## 2016-08-31 LAB — HM DIABETES EYE EXAM

## 2016-09-17 ENCOUNTER — Ambulatory Visit (INDEPENDENT_AMBULATORY_CARE_PROVIDER_SITE_OTHER): Payer: BLUE CROSS/BLUE SHIELD | Admitting: Podiatry

## 2016-09-17 DIAGNOSIS — M79609 Pain in unspecified limb: Secondary | ICD-10-CM | POA: Diagnosis not present

## 2016-09-17 DIAGNOSIS — L608 Other nail disorders: Secondary | ICD-10-CM

## 2016-09-17 DIAGNOSIS — L603 Nail dystrophy: Secondary | ICD-10-CM | POA: Diagnosis not present

## 2016-09-17 DIAGNOSIS — Z79899 Other long term (current) drug therapy: Secondary | ICD-10-CM | POA: Diagnosis not present

## 2016-09-17 DIAGNOSIS — B351 Tinea unguium: Secondary | ICD-10-CM

## 2016-09-17 MED ORDER — TERBINAFINE HCL 250 MG PO TABS: 250.0000 mg | ORAL_TABLET | Freq: Every day | ORAL | 0 refills | Status: DC

## 2016-09-21 NOTE — Progress Notes (Signed)
   Subjective:  Patient presents today for follow-up evaluation and treatment of fungal nails. Patient presents today to review nail biopsy results and discuss possible treatment modalities.    Objective/Physical Exam General: The patient is alert and oriented x3 in no acute distress.  Dermatology: Open lesion with a prominent soft tissue mass noted within the lesion of the third digit distal tuft right foot. Upon debridement of the soft tissue mass there was an excessive amount of bleeding noted. Periwound integrity appears to be intact. No cellulitis noted. No malodor. Skin is warm, dry and supple bilateral lower extremities.   Vascular: Palpable pedal pulses bilaterally. No edema or erythema noted. Capillary refill within normal limits.  Neurological: Epicritic and protective threshold grossly intact bilaterally.   Musculoskeletal Exam: Range of motion within normal limits to all pedal and ankle joints bilateral. Muscle strength 5/5 in all groups bilateral.   Assessment: #1 iatrogenic granuloma/hemangioma third digit right foot-resolved #2 onychomycosis  Plan of Care:  #1 Patient was evaluated. Nail biopsy results were reviewed today positive for onychomycosis. #2 hepatic function panel ordered today #3 prescription for terbinafine 250 mg #90 #4 recommend over-the-counter urea 40% cream #5 return to clinic in 4 months   Edrick Kins, DPM [facility]  Dr. Edrick Kins, [place] [address]                                        [place], [place] [address]                Office [phone]  Fax [phone]

## 2016-09-24 DIAGNOSIS — Z79899 Other long term (current) drug therapy: Secondary | ICD-10-CM | POA: Diagnosis not present

## 2016-09-24 LAB — HEPATIC FUNCTION PANEL
ALT: 19 U/L (ref 6–29)
AST: 21 U/L (ref 10–35)
Albumin: 3.7 g/dL (ref 3.6–5.1)
Alkaline Phosphatase: 71 U/L (ref 33–130)
Bilirubin, Direct: 0.1 mg/dL (ref <=0.2)
Indirect Bilirubin: 0.3 mg/dL (ref 0.2–1.2)
Total Bilirubin: 0.4 mg/dL (ref 0.2–1.2)
Total Protein: 6.8 g/dL (ref 6.1–8.1)

## 2016-09-25 ENCOUNTER — Encounter: Payer: Self-pay | Admitting: Internal Medicine

## 2016-09-26 ENCOUNTER — Telehealth (INDEPENDENT_AMBULATORY_CARE_PROVIDER_SITE_OTHER): Payer: Self-pay | Admitting: Orthopedic Surgery

## 2016-09-26 ENCOUNTER — Other Ambulatory Visit: Payer: Self-pay | Admitting: Internal Medicine

## 2016-09-26 NOTE — Telephone Encounter (Signed)
Spoke with Dr Marlou Sa. We will get her scheduled for right TKA Patient aware she will be contacted to get scheduled.

## 2016-09-26 NOTE — Telephone Encounter (Signed)
Pt would like a call back concerning getting surgery done on her knee. She stated she has already discussed this and wants to schedule.  [phone]

## 2016-09-26 NOTE — Telephone Encounter (Signed)
Called pt to schedule surgery. She stated she will need to contact her granddaughters school about trying to change where she gets on and off the bus since the pt will not be able to take her to the bus stop while she is healing from surgery. Patient stated she will call me on Monday 4/9 to schedule.

## 2016-09-27 ENCOUNTER — Telehealth (INDEPENDENT_AMBULATORY_CARE_PROVIDER_SITE_OTHER): Payer: Self-pay | Admitting: Orthopedic Surgery

## 2016-09-27 NOTE — Telephone Encounter (Signed)
Please make sure she receives transixemic in preop holding prior to her surgery

## 2016-09-27 NOTE — Telephone Encounter (Signed)
Pt wanted to let you know that she wishes to not receive any blood products even if she was to need them during surgery.

## 2016-10-01 ENCOUNTER — Telehealth (INDEPENDENT_AMBULATORY_CARE_PROVIDER_SITE_OTHER): Payer: Self-pay | Admitting: Orthopedic Surgery

## 2016-10-01 NOTE — Telephone Encounter (Signed)
Spoke with pt about scheduling surgery. She has now decided that she would like to get a 2nd opinion first before deciding anything. She did ask if a note could be wrote that she is pending surgery? Please give pt a call advising.

## 2016-10-01 NOTE — Telephone Encounter (Signed)
Called LM for patient advising could pick up at front desk.

## 2016-10-01 NOTE — Telephone Encounter (Signed)
Ok to write note for patient? She has not scheduled surgery yet. She is going for second opinion but requesting note stating that she has surgery pending. She said she needed it for purpose of transportation with grandchildren to and from school. Please advise.

## 2016-10-01 NOTE — Telephone Encounter (Signed)
y

## 2016-10-05 ENCOUNTER — Telehealth: Payer: Self-pay | Admitting: *Deleted

## 2016-10-05 NOTE — Telephone Encounter (Signed)
Request 90 day supply of Terbinafine. Dr. Trinidad Curet. Return fax okay #[phone].

## 2016-10-11 ENCOUNTER — Other Ambulatory Visit (INDEPENDENT_AMBULATORY_CARE_PROVIDER_SITE_OTHER): Payer: Self-pay | Admitting: Orthopedic Surgery

## 2016-10-11 ENCOUNTER — Telehealth (INDEPENDENT_AMBULATORY_CARE_PROVIDER_SITE_OTHER): Payer: Self-pay | Admitting: Orthopedic Surgery

## 2016-10-11 DIAGNOSIS — M1711 Unilateral primary osteoarthritis, right knee: Secondary | ICD-10-CM

## 2016-10-11 NOTE — Telephone Encounter (Signed)
PATIENT CALLED STATING [facility] DID NOT HAVE HER RECORDS THAT I FAXED ON 10/03/16. SHE WAS ADVISED THEY WERE FAXED TO THE FAX # ON RELEASE [phone] TO THEIR COMP REHAB.  SHE WANTS RECORDS FAXED TO THE [place]. I [facility] RECORDS [phone]

## 2016-10-14 NOTE — Progress Notes (Signed)
Subjective:    Patient ID: Janet Le, female    DOB: 1956-01-30, 61 y.o.   MRN: [id number]  HPI She is here for an acute visit.   Leg and feet pain:  She has been having a burning pain in the bottom of her feet up to her knees for about one month.  The pain is intermittent, but becoming more frequent.  It is related to activity or position.  She denies numbness or tingling.   She is having more back spasms and pain.    Medications and allergies reviewed with patient and updated if appropriate.  Patient Active Problem List   Diagnosis Date Noted  . Low back pain 08/01/2016  . Chronic low back pain 07/18/2016  . Prediabetes 05/30/2016  . Cervicalgia 05/30/2016  . Right knee pain 05/30/2016  . Osteopenia 11/18/2015  . Cephalalgia 07/20/2015  . Thrombocytopenia ([place]) 05/30/2015  . Arthritis of left lower extremity 02/02/2015  . Left knee pain 09/08/2014  . Gout 12/11/2013  . Vision disturbance 12/08/2013  . Severe obesity (BMI >= 40) ([place]) 11/06/2013  . Osteoporosis   . Left lumbar radiculopathy 04/13/2012  . Renal cell cancer ([place])   . External hemorrhoid 11/14/2010  . IBS (irritable bowel syndrome) 11/14/2010  . GERD (gastroesophageal reflux disease) 11/14/2010  . HYPERCHOLESTEROLEMIA, MILD 03/16/2010  . Lymphedema 03/15/2010  . ALLERGIC RHINITIS 08/31/2009  . MIGRAINE HEADACHE 06/14/2009  . SINUSITIS, CHRONIC 06/14/2009  . SYNCOPE 05/16/2009  . Essential hypertension 05/06/2009  . CONSTIPATION, CHRONIC 05/06/2009    Current Outpatient Prescriptions on File Prior to Visit  Medication Sig Dispense Refill  . ACCU-CHEK SOFTCLIX LANCETS lancets by Other route. Use twice a day for blood sugar     . alendronate (FOSAMAX) 70 MG tablet Take 1 tablet (70 mg total) by mouth once a week. Take with a full glass of water on an empty stomach. 12 tablet 3  . amLODipine (NORVASC) 10 MG tablet Take 1 tablet (10 mg total) by mouth daily. 90 tablet 3  . atorvastatin (LIPITOR)  10 MG tablet TAKE 1 TABLET DAILY 90 tablet 3  . calcium-vitamin D (OSCAL WITH D) 500-200 MG-UNIT tablet Take 1 tablet by mouth.    . Diclofenac Sodium 2 % SOLN Apply 1 pump twice daily. 112 g 1  . fluticasone (FLONASE) 50 MCG/ACT nasal spray Place 1 spray into both nostrils daily. 16 g 2  . furosemide (LASIX) 20 MG tablet TAKE 1 TABLET DAILY 90 tablet 0  . glucose blood (ONETOUCH VERIO) test strip Use one strip to check blood sugar daily 100 each 4  . Insulin Pen Needle (NOVOFINE) 32G X 6 MM MISC Use daily with Saxenda 100 each 1  . Linaclotide (LINZESS) 145 MCG CAPS capsule Take 1 capsule (145 mcg total) by mouth daily. 90 capsule 1  . Liraglutide -Weight Management (SAXENDA) 18 MG/3ML SOPN Inject 0.6 mg into the skin daily. For one week, then increase 0.6 mg weekly until you reach 3 mg daily 15 pen 1  . naproxen (NAPROSYN) 250 MG tablet Take 1 tablet (250 mg total) by mouth 2 (two) times daily with a meal. Prn pain 12 tablet 0  . ONETOUCH DELICA LANCETS 76E MISC 1 each by Does not apply route daily. Use to help check blood sugars once daily Dx 250.00 100 each 3  . Prenatal Vit-Fe Fumarate-FA (PNV PRENATAL PLUS MULTIVITAMIN) 27-1 MG TABS TAKE 1 TABLET DAILY AT 12 NOON 60 tablet 1  . propranolol (INDERAL) 80 MG  tablet Take 1 tablet (80 mg total) by mouth 2 (two) times daily. 28 tablet 0  . terbinafine (LAMISIL) 250 MG tablet Take 1 tablet (250 mg total) by mouth daily. 90 tablet 0  . valsartan (DIOVAN) 320 MG tablet Take 1 tablet (320 mg total) by mouth daily. 90 tablet 3   No current facility-administered medications on file prior to visit.     Past Medical History:  Diagnosis Date  . Allergic rhinitis, cause unspecified   . Chronic constipation   . Chronic sinusitis   . Endometriosis   . ENDOMETRIOSIS 05/06/2009   Qualifier: Diagnosis of  By: Varney Daily RN, Butch Penny    . Esophageal stricture   . GERD (gastroesophageal reflux disease)   . Hemorrhoids   . Hiatal hernia   . Hiatal hernia     . Hyperlipidemia   . Hypertension   . Irritable bowel syndrome   . Osteoarthritis   . Personal history of diabetes mellitus    controlled with diet only  . Renal cell cancer ([place]) 11/2010 dx   R, s/p cryoablation 02/16/11  . Syncopal episodes    since childhood    Past Surgical History:  Procedure Laterality Date  . BREAST SURGERY     bilateral  . fallopian tube removed    . FUNCTIONAL ENDOSCOPIC SINUS SURGERY    . LASER ABLATION OF THE CERVIX    . 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 TUBAL LIGATION      Social History   Social History  . Marital status: Married    Spouse name: N/A  . Number of children: N/A  . Years of education: N/A   Occupational History  . 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 Smoking status: Never Smoker  . Smokeless tobacco: Never Used  . Alcohol use No     Comment: rare  . Drug use: No  . Sexual activity: No   Other Topics Concern  . Not on file   Social History Narrative  . No narrative on file    Family History  Problem Relation Age of Onset  . Diabetes Mother   . Kidney disease Father   . Prostate cancer Father   . Colitis Brother   . Leukemia Brother   . Multiple sclerosis Sister   . Colon polyps    . Colon cancer Neg Hx     Review of Systems  Constitutional: Negative for fever.  Musculoskeletal: Positive for back pain, gait problem (related to back - ? related to legs) and myalgias.  Skin: Negative for color change and rash.  Neurological: Negative for weakness and numbness.       Objective:   Vitals:   10/15/16 1122  BP: (!) 142/84  Pulse: 74  Resp: 16  Temp: 98.4 F (36.9 C)   Filed Weights   10/15/16 1122  Weight: 220 lb (99.8 kg)   Body mass index is 40.24 kg/m.  Wt Readings from Last 3 Encounters:  10/15/16 220 lb (99.8 kg)  05/30/16 226 lb (102.5 kg)  11/28/15 228 lb (103.4 kg)     Physical Exam   Constitutional: She appears well-developed and well-nourished. No distress.  HENT:  Head: Normocephalic and atraumatic.  Musculoskeletal: She exhibits edema (trace edema b/l legs).  Neurological: She exhibits normal muscle tone. Coordination normal.  Normal sensation b/l legs and feet; normal strength b/l legs  Skin: Skin is warm and dry. No rash noted. She is not diaphoretic. No erythema.        Assessment & Plan:   See Problem List for Assessment and Plan of chronic medical problems.

## 2016-10-15 ENCOUNTER — Other Ambulatory Visit (INDEPENDENT_AMBULATORY_CARE_PROVIDER_SITE_OTHER): Payer: Self-pay | Admitting: Orthopedic Surgery

## 2016-10-15 ENCOUNTER — Ambulatory Visit (INDEPENDENT_AMBULATORY_CARE_PROVIDER_SITE_OTHER): Payer: BLUE CROSS/BLUE SHIELD | Admitting: Internal Medicine

## 2016-10-15 ENCOUNTER — Encounter: Payer: Self-pay | Admitting: Internal Medicine

## 2016-10-15 ENCOUNTER — Other Ambulatory Visit (INDEPENDENT_AMBULATORY_CARE_PROVIDER_SITE_OTHER): Payer: BLUE CROSS/BLUE SHIELD

## 2016-10-15 VITALS — BP 142/84 | HR 74 | Temp 98.4°F | Resp 16 | Ht 62.0 in | Wt 220.0 lb

## 2016-10-15 DIAGNOSIS — R52 Pain, unspecified: Secondary | ICD-10-CM

## 2016-10-15 LAB — CBC WITH DIFFERENTIAL/PLATELET
Basophils Absolute: 0 10*3/uL (ref 0.0–0.1)
Basophils Relative: 0.7 % (ref 0.0–3.0)
Eosinophils Absolute: 0.1 10*3/uL (ref 0.0–0.7)
Eosinophils Relative: 1.5 % (ref 0.0–5.0)
HCT: 38.5 % (ref 36.0–46.0)
Hemoglobin: 12.6 g/dL (ref 12.0–15.0)
Lymphocytes Relative: 26.7 % (ref 12.0–46.0)
Lymphs Abs: 2 10*3/uL (ref 0.7–4.0)
MCHC: 32.8 g/dL (ref 30.0–36.0)
MCV: 95.2 fl (ref 78.0–100.0)
Monocytes Absolute: 0.5 10*3/uL (ref 0.1–1.0)
Monocytes Relative: 6.9 % (ref 3.0–12.0)
Neutro Abs: 4.7 10*3/uL (ref 1.4–7.7)
Neutrophils Relative %: 64.2 % (ref 43.0–77.0)
Platelets: 130 10*3/uL — ABNORMAL LOW (ref 150.0–400.0)
RBC: 4.04 Mil/uL (ref 3.87–5.11)
RDW: 14.3 % (ref 11.5–15.5)
WBC: 7.4 10*3/uL (ref 4.0–10.5)

## 2016-10-15 LAB — COMPREHENSIVE METABOLIC PANEL
ALT: 18 U/L (ref 0–35)
AST: 18 U/L (ref 0–37)
Albumin: 4 g/dL (ref 3.5–5.2)
Alkaline Phosphatase: 71 U/L (ref 39–117)
BUN: 12 mg/dL (ref 6–23)
CO2: 31 mEq/L (ref 19–32)
Calcium: 10.5 mg/dL (ref 8.4–10.5)
Chloride: 107 mEq/L (ref 96–112)
Creatinine, Ser: 0.87 mg/dL (ref 0.40–1.20)
GFR: 85.06 mL/min (ref >60.00)
Glucose, Bld: 89 mg/dL (ref 70–99)
Potassium: 3.9 mEq/L (ref 3.5–5.1)
Sodium: 143 mEq/L (ref 135–145)
Total Bilirubin: 0.4 mg/dL (ref 0.2–1.2)
Total Protein: 7.3 g/dL (ref 6.0–8.3)

## 2016-10-15 LAB — TSH: TSH: 1.67 u[IU]/mL (ref 0.35–4.50)

## 2016-10-15 LAB — HEMOGLOBIN A1C: Hgb A1c MFr Bld: 5.5 % (ref 4.6–6.5)

## 2016-10-15 NOTE — Assessment & Plan Note (Signed)
Pain sounds neuropathic ? Peripheral neuropathy vs radiculopathy She is following with Orthopedics - she will follow up with them Will refer her to neurology Check basic labs - tsh, B12, a1c have been normal in recent past - will recheck them Further evaluation per neurology

## 2016-10-15 NOTE — Progress Notes (Signed)
Pre visit review using our clinic review tool, if applicable. No additional management support is needed unless otherwise documented below in the visit note. 

## 2016-10-15 NOTE — Patient Instructions (Signed)
  Test(s) ordered today. Your results will be released to MyChart (or called to you) after review, usually within 72hours after test completion. If any changes need to be made, you will be notified at that same time.    Medications reviewed and updated.  No changes recommended at this time.   A referral was ordered for neurology     

## 2016-10-16 LAB — ANA: Anti Nuclear Antibody(ANA): NEGATIVE

## 2016-10-17 ENCOUNTER — Encounter (HOSPITAL_COMMUNITY): Payer: Self-pay

## 2016-10-17 NOTE — Pre-Procedure Instructions (Signed)
Janet Le  10/17/2016      [facility] [place], [place] - [address] [place] [address] Phone: [phone] Fax: [place], [place], [place] - [address] [place] [address] Phone: [phone] Fax: [phone]  [facility] [place], [place] [address] [place] [address] Phone: [phone] Fax: [phone]    Your procedure is scheduled on Tuesday May 8.  Report to [facility] Admitting at 9:25 A.M.  Call this number if you have problems the morning of surgery:  [phone]   Remember:  Do not eat food or drink liquids after midnight.  Take these medicines the morning of surgery with A SIP OF WATER: amlodipine (norvasc), propranolol (inderal)  Take all other medications as prescribed except 7 days prior to surgery STOP taking any Aspirin, Aleve, Naproxen, Ibuprofen, Motrin, Advil, Goody's, BC's, all herbal medications, fish oil, and all vitamins  WHAT DO I DO ABOUT MY DIABETES MEDICATION?  [name] The day of surgery, do not take other diabetes injectables, including Byetta (exenatide), Bydureon (exenatide ER), Victoza (liraglutide), or Trulicity (dulaglutide). DO NOT TAKE Liraglutide the day of surgery (Saxenda) .    How to Manage Your Diabetes Before and After Surgery  Why is it important to control my blood sugar before and after surgery? . Improving blood sugar levels before and after surgery helps healing and can limit problems. . A way of improving blood sugar control is eating a healthy diet by: o  Eating less sugar and carbohydrates o  Increasing activity/exercise o  Talking with your doctor about reaching your blood sugar goals . High blood sugars (greater than 180 mg/dL) can raise your risk of infections and slow your recovery, so you will need to focus on controlling your diabetes  during the weeks before surgery. . Make sure that the doctor who takes care of your diabetes knows about your planned surgery including the date and location.  How do I manage my blood sugar before surgery? . Check your blood sugar at least 4 times a day, starting 2 days before surgery, to make sure that the level is not too high or low. o Check your blood sugar the morning of your surgery when you wake up and every 2 hours until you get to the Short Stay unit. . If your blood sugar is less than 70 mg/dL, you will need to treat for low blood sugar: o Do not take insulin. o Treat a low blood sugar (less than 70 mg/dL) with  cup of clear juice (cranberry or apple), 4 glucose tablets, OR glucose gel. o Recheck blood sugar in 15 minutes after treatment (to make sure it is greater than 70 mg/dL). If your blood sugar is not greater than 70 mg/dL on recheck, call [phone] for further instructions. . Report your blood sugar to the short stay nurse when you get to Short Stay.  . If you are admitted to the hospital after surgery: o Your blood sugar will be checked by the staff and you will probably be given insulin after surgery (instead of oral diabetes medicines) to make sure you have good blood sugar levels. o The goal for blood sugar control after surgery is 80-180 mg/dL.                 Do not wear jewelry, make-up or  nail polish.  Do not wear lotions, powders, or perfumes, or deoderant.  Do not shave 48 hours prior to surgery.  Men may shave face and neck.  Do not bring valuables to the hospital.  [facility] is not responsible for any belongings or valuables.  Contacts, dentures or bridgework may not be worn into surgery.  Leave your suitcase in the car.  After surgery it may be brought to your room.  For patients admitted to the hospital, discharge time will be determined by your treatment team.  Patients discharged the day of surgery will not be allowed to drive home.     Special instructions:    [place]- Preparing For Surgery  Before surgery, you can play an important role. Because skin is not sterile, your skin needs to be as free of germs as possible. You can reduce the number of germs on your skin by washing with CHG (chlorahexidine gluconate) Soap before surgery.  CHG is an antiseptic cleaner which kills germs and bonds with the skin to continue killing germs even after washing.  Please do not use if you have an allergy to CHG or antibacterial soaps. If your skin becomes reddened/irritated stop using the CHG.  Do not shave (including legs and underarms) for at least 48 hours prior to first CHG shower. It is OK to shave your face.  Please follow these instructions carefully.   1. Shower the NIGHT BEFORE SURGERY and the MORNING OF SURGERY with CHG.   2. If you chose to wash your hair, wash your hair first as usual with your normal shampoo.  3. After you shampoo, rinse your hair and body thoroughly to remove the shampoo.  4. Use CHG as you would any other liquid soap. You can apply CHG directly to the skin and wash gently with a scrungie or a clean washcloth.   5. Apply the CHG Soap to your body ONLY FROM THE NECK DOWN.  Do not use on open wounds or open sores. Avoid contact with your eyes, ears, mouth and genitals (private parts). Wash genitals (private parts) with your normal soap.  6. Wash thoroughly, paying special attention to the area where your surgery will be performed.  7. Thoroughly rinse your body with warm water from the neck down.  8. DO NOT shower/wash with your normal soap after using and rinsing off the CHG Soap.  9. Pat yourself dry with a CLEAN TOWEL.   10. Wear CLEAN PAJAMAS   11. Place CLEAN SHEETS on your bed the night of your first shower and DO NOT SLEEP WITH PETS.    Day of Surgery: Do not apply any deodorants/lotions. Please wear clean clothes to the hospital/surgery center.      Please read over the  following fact sheets that you were given. Total Joint Packet and MRSA Information

## 2016-10-18 ENCOUNTER — Ambulatory Visit (INDEPENDENT_AMBULATORY_CARE_PROVIDER_SITE_OTHER): Payer: BLUE CROSS/BLUE SHIELD | Admitting: Neurology

## 2016-10-18 ENCOUNTER — Encounter (HOSPITAL_COMMUNITY)
Admission: RE | Admit: 2016-10-18 | Discharge: 2016-10-18 | Disposition: A | Discharge status: Home / Self Care | Payer: BLUE CROSS/BLUE SHIELD | Source: Ambulatory Visit | Attending: Orthopedic Surgery | Admitting: Orthopedic Surgery

## 2016-10-18 ENCOUNTER — Encounter: Payer: Self-pay | Admitting: Internal Medicine

## 2016-10-18 ENCOUNTER — Ambulatory Visit (HOSPITAL_COMMUNITY)
Admission: RE | Admit: 2016-10-18 | Discharge: 2016-10-18 | Disposition: A | Discharge status: Home / Self Care | Payer: BLUE CROSS/BLUE SHIELD | Source: Ambulatory Visit | Attending: Orthopedic Surgery | Admitting: Orthopedic Surgery

## 2016-10-18 ENCOUNTER — Encounter (HOSPITAL_COMMUNITY): Payer: Self-pay

## 2016-10-18 ENCOUNTER — Other Ambulatory Visit: Payer: Self-pay | Admitting: *Deleted

## 2016-10-18 ENCOUNTER — Other Ambulatory Visit: Payer: BLUE CROSS/BLUE SHIELD

## 2016-10-18 ENCOUNTER — Encounter: Payer: Self-pay | Admitting: Neurology

## 2016-10-18 VITALS — BP 116/78 | HR 76 | Temp 98.1°F | Resp 16 | Ht 61.5 in | Wt 218.1 lb

## 2016-10-18 DIAGNOSIS — Z01812 Encounter for preprocedural laboratory examination: Secondary | ICD-10-CM | POA: Insufficient documentation

## 2016-10-18 DIAGNOSIS — Z419 Encounter for procedure for purposes other than remedying health state, unspecified: Secondary | ICD-10-CM

## 2016-10-18 DIAGNOSIS — Z01810 Encounter for preprocedural cardiovascular examination: Secondary | ICD-10-CM | POA: Diagnosis not present

## 2016-10-18 DIAGNOSIS — G6289 Other specified polyneuropathies: Secondary | ICD-10-CM | POA: Diagnosis not present

## 2016-10-18 DIAGNOSIS — M1711 Unilateral primary osteoarthritis, right knee: Secondary | ICD-10-CM | POA: Insufficient documentation

## 2016-10-18 DIAGNOSIS — I517 Cardiomegaly: Secondary | ICD-10-CM | POA: Diagnosis not present

## 2016-10-18 DIAGNOSIS — Z01818 Encounter for other preprocedural examination: Secondary | ICD-10-CM | POA: Insufficient documentation

## 2016-10-18 DIAGNOSIS — G629 Polyneuropathy, unspecified: Secondary | ICD-10-CM

## 2016-10-18 HISTORY — DX: Disease of blood and blood-forming organs, unspecified: D75.9

## 2016-10-18 HISTORY — DX: Major depressive disorder, single episode, unspecified: F32.9

## 2016-10-18 HISTORY — DX: Dorsalgia, unspecified: M54.9

## 2016-10-18 HISTORY — DX: Anxiety disorder, unspecified: F41.9

## 2016-10-18 HISTORY — DX: Depression, unspecified: F32.A

## 2016-10-18 HISTORY — DX: Other chronic pain: G89.29

## 2016-10-18 LAB — URINALYSIS, ROUTINE W REFLEX MICROSCOPIC
Bacteria, UA: NONE SEEN
Bilirubin Urine: NEGATIVE
Glucose, UA: NEGATIVE mg/dL
Hgb urine dipstick: NEGATIVE
Ketones, ur: NEGATIVE mg/dL
Nitrite: NEGATIVE
Protein, ur: NEGATIVE mg/dL
Specific Gravity, Urine: 1.019 (ref 1.005–1.030)
pH: 5 (ref 5.0–8.0)

## 2016-10-18 LAB — CBC
HCT: 38.9 % (ref 36.0–46.0)
Hemoglobin: 12.7 g/dL (ref 12.0–15.0)
MCH: 31.1 pg (ref 26.0–34.0)
MCHC: 32.6 g/dL (ref 30.0–36.0)
MCV: 95.1 fL (ref 78.0–100.0)
Platelets: 144 10*3/uL — ABNORMAL LOW (ref 150–400)
RBC: 4.09 MIL/uL (ref 3.87–5.11)
RDW: 15.1 % (ref 11.5–15.5)
WBC: 8.2 10*3/uL (ref 4.0–10.5)

## 2016-10-18 LAB — GLUCOSE, CAPILLARY: Glucose-Capillary: 83 mg/dL (ref 65–99)

## 2016-10-18 LAB — SURGICAL PCR SCREEN
MRSA, PCR: NEGATIVE
Staphylococcus aureus: NEGATIVE

## 2016-10-18 LAB — VITAMIN B12: Vitamin B-12: 537 pg/mL (ref 211–911)

## 2016-10-18 MED ORDER — PREGABALIN 75 MG PO CAPS: 75.0000 mg | ORAL_CAPSULE | Freq: Two times a day (BID) | ORAL | 0 refills | Status: DC

## 2016-10-18 NOTE — Addendum Note (Signed)
Addended by: Tommas Olp B on: 10/18/2016 10:00 AM   Modules accepted: Orders

## 2016-10-18 NOTE — Patient Instructions (Signed)
1.  I will provide you with samples for Lyrica 75mg .  Take 1 pill twice daily (1 in morning and 1 at night).  Contact me when you need refill or to increase dose. 2.  We will check a nerve study of the legs to look for nerve problems 3.  I will check a vitamin B12 level 4.  Follow up after testing

## 2016-10-18 NOTE — Progress Notes (Signed)
PCP - Billey Gosling Cardiologist - none, pt denies cardiac hx  Chest x-ray - 10/18/2016  EKG - 10/18/2016    Sleep Study - pt reports having sleep study that was negative for sleep apnea at some point in last 5 yeas but does not remember where   Patient denies history of diabetes, takes [place] for weight loss and states she does not check her CBG at home. CBG checked today per anesthesia,   Pt had labs drawn on 10/15/16 CMET normal, CBC With platelets of 130. Pt also refuses blood products. Pt states she will look over refusal form and sign it day of surgery. Spoke with Levada Dy with anesthesia, need to redraw CBC only prior to surgery.    Patient denies shortness of breath, fever, cough and chest pain at PAT appointment   Patient verbalized understanding of instructions that was given to them at the PAT appointment. Patient expressed that there were no further questions.  Patient was also instructed that they will need to review over the PAT instructions again at home before the surgery.

## 2016-10-18 NOTE — Addendum Note (Signed)
Addended by: Tommas Olp B on: 10/18/2016 10:03 AM   Modules accepted: Orders

## 2016-10-18 NOTE — Progress Notes (Signed)
NEUROLOGY FOLLOW UP OFFICE NOTE  Janet Le [id number]  HISTORY OF PRESENT ILLNESS: Janet Le is a 61 year old right-handed woman with history of renal cell carcinoma, hypertension, pre-diabetes, migraine, IBS, lymphedema, thrombocytopenia, osteoarthritis and osteoporosis, whom I last saw in June 2015, presents today for a new issue, burning in the feet.  Janet Le has a history of chronic low back pain with left lumbar radiculopathy, as well as right knee pain.  She has been seen and evaluated by orthopedics and pain management.  MRI of lumbar spine from 07/27/16 was personally reviewed and revealed advanced L4-5 and L5-S1 facet arthrosis with some bilateral neural foraminal stenosis.  Dr. Marlou Sa of orthopedics did not think surgery was an option at that time.  She was going be referred to Dr. Ernestina Patches, a pain specialist, for epidural steroid injections and possibly facet joint nerve ablation, but an appointment was never made.  About a month ago, she started experiencing burning pain from the bottom of her feet up the back of her legs to the knees.  It is intermittent and lasts for various and unknown period of time, but not aware of any triggers such as due to position.  In addition, she is reporting increased back pain and spasms, worse when sitting and improved when laying supine.    She has history of prediabetes.  Recent Hgb A1c ws 5.5 and glucose was 89, however.  She has been seen by podiatry and has been treated for a hemangioma involving the third digit of the right foot, as well as for painful onychomycosis.  Labs from 10/15/16 include:  negative ANA; Hgb A1c 5.5; TSH 1.67; CBC with WBC 7.4, HGB 12.6, HCT 38.5, PLT 130 (baseline); CMP with Na 143, K 3.9, Cl 107, CO2 31, glucose 89, BUN 12, Cr 0.87, total bili 0.4, ALP 71, AST 18, and ALT 18.  A B12 was supposed to be ordered but not performed.  Past medication:  gabapentin (caused hallucinatiosn), duloxetine 60mg  (used several  years ago for depression)  PAST MEDICAL HISTORY: Past Medical History:  Diagnosis Date  . Allergic rhinitis, cause unspecified   . Chronic constipation   . Chronic sinusitis   . Endometriosis   . ENDOMETRIOSIS 05/06/2009   Qualifier: Diagnosis of  By: Varney Daily RN, Butch Penny    . Esophageal stricture   . GERD (gastroesophageal reflux disease)   . Hemorrhoids   . Hiatal hernia   . Hiatal hernia   . Hyperlipidemia   . Hypertension   . Irritable bowel syndrome   . Osteoarthritis   . Personal history of diabetes mellitus    controlled with diet only  . Renal cell cancer ([place]) 11/2010 dx   R, s/p cryoablation 02/16/11  . Syncopal episodes    since childhood    MEDICATIONS: Current Outpatient Prescriptions on File Prior to Visit  Medication Sig Dispense Refill  . ACCU-CHEK SOFTCLIX LANCETS lancets by Other route. Use twice a day for blood sugar     . alendronate (FOSAMAX) 70 MG tablet Take 1 tablet (70 mg total) by mouth once a week. Take with a full glass of water on an empty stomach. 12 tablet 3  . amLODipine (NORVASC) 10 MG tablet Take 1 tablet (10 mg total) by mouth daily. 90 tablet 3  . atorvastatin (LIPITOR) 10 MG tablet TAKE 1 TABLET DAILY 90 tablet 3  . calcium-vitamin D (OSCAL WITH D) 500-200 MG-UNIT tablet Take 1 tablet by mouth daily.     . Diclofenac  Sodium 2 % SOLN Apply 1 pump twice daily. (Patient taking differently: Apply 1 application topically 2 (two) times daily as needed (PAIN). Apply 1 pump twice daily.) 112 g 1  . fluticasone (FLONASE) 50 MCG/ACT nasal spray Place 1 spray into both nostrils daily. 16 g 2  . furosemide (LASIX) 20 MG tablet TAKE 1 TABLET DAILY 90 tablet 0  . glucose blood (ONETOUCH VERIO) test strip Use one strip to check blood sugar daily 100 each 4  . Insulin Pen Needle (NOVOFINE) 32G X 6 MM MISC Use daily with Saxenda 100 each 1  . Linaclotide (LINZESS) 145 MCG CAPS capsule Take 1 capsule (145 mcg total) by mouth daily. (Patient taking differently:  Take 145 mcg by mouth daily as needed (CONSTIPATION). ) 90 capsule 1  . Liraglutide -Weight Management (SAXENDA) 18 MG/3ML SOPN Inject 0.6 mg into the skin daily. For one week, then increase 0.6 mg weekly until you reach 3 mg daily 15 pen 1  . naproxen (NAPROSYN) 250 MG tablet Take 1 tablet (250 mg total) by mouth 2 (two) times daily with a meal. Prn pain 12 tablet 0  . nystatin (MYCOSTATIN) 100000 UNIT/ML suspension Take 5 mLs by mouth 3 (three) times daily. SWISH AND SWALLOW    . ONETOUCH DELICA LANCETS 27O MISC 1 each by Does not apply route daily. Use to help check blood sugars once daily Dx 250.00 100 each 3  . Prenatal Vit-Fe Fumarate-FA (PNV PRENATAL PLUS MULTIVITAMIN) 27-1 MG TABS TAKE 1 TABLET DAILY AT 12 NOON 60 tablet 1  . propranolol (INDERAL) 80 MG tablet Take 1 tablet (80 mg total) by mouth 2 (two) times daily. 28 tablet 0  . terbinafine (LAMISIL) 250 MG tablet Take 1 tablet (250 mg total) by mouth daily. 90 tablet 0  . valsartan (DIOVAN) 320 MG tablet Take 1 tablet (320 mg total) by mouth daily. 90 tablet 3  . zolpidem (AMBIEN) 10 MG tablet Take 10 mg by mouth at bedtime as needed for sleep.     No current facility-administered medications on file prior to visit.     ALLERGIES: Allergies  Allergen Reactions  . Sertraline Hcl Other (See Comments)    Spasms; numbness  . Ace Inhibitors Cough  . Codeine Palpitations  . Darifenacin Hydrobromide Er Other (See Comments)    Pt states made her feel queezy & drunk  . Gabapentin Other (See Comments)    Hallucinations  . Pravastatin Other (See Comments)    myalgias  . Propoxyphene Hcl Palpitations  . Wellbutrin [Bupropion Hcl] Other (See Comments)    Numbness of mouth/lips    FAMILY HISTORY: Family History  Problem Relation Age of Onset  . Diabetes Mother   . Kidney disease Father   . Prostate cancer Father   . Colitis Brother   . Leukemia Brother   . Multiple sclerosis Sister   . Colon polyps    . Colon cancer Neg Hx       SOCIAL HISTORY: Social History   Social History  . Marital status: Married    Spouse name: N/A  . Number of children: N/A  . Years of education: N/A   Occupational History  . 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 Smoking status: Never Smoker  . Smokeless tobacco: Never Used  . Alcohol use No     Comment: rare  . Drug use: No  . Sexual activity: No   Other Topics  Concern  . Not on file   Social History Narrative  . No narrative on file    REVIEW OF SYSTEMS: Constitutional: No fevers, chills, or sweats, no generalized fatigue, change in appetite Eyes: No visual changes, double vision, eye pain Ear, nose and throat: No hearing loss, ear pain, nasal congestion, sore throat Cardiovascular: No chest pain, palpitations Respiratory:  No shortness of breath at rest or with exertion, wheezes GastrointestinaI: No nausea, vomiting, diarrhea, abdominal pain, fecal incontinence Genitourinary:  No dysuria, urinary retention or frequency Musculoskeletal:  Neck pain, back pain, knee pain Integumentary: No rash, pruritus, skin lesions Neurological: as above Psychiatric: No depression, insomnia, anxiety Endocrine: No palpitations, fatigue, diaphoresis, mood swings, change in appetite, change in weight, increased thirst Hematologic/Lymphatic:  No purpura, petechiae. Allergic/Immunologic: no itchy/runny eyes, nasal congestion, recent allergic reactions, rashes  PHYSICAL EXAM: Vitals:   10/18/16 0918  BP: 116/78  Pulse: 76  Resp: 16  Temp: 98.1 F (36.7 C)   General: No acute distress.  Patient appears well-groomed.   Head:  Normocephalic/atraumatic Eyes:  Fundi examined but not visualized Neck: supple, no paraspinal tenderness, full range of motion Heart:  Regular rate and rhythm Lungs:  Clear to auscultation bilaterally Back: bilateral paraspinal tenderness Neurological Exam: alert and oriented to person, place, and  time. Attention span and concentration intact, recent and remote memory intact, fund of knowledge intact.  Speech fluent and not dysarthric, language intact.  CN II-XII intact. Bulk and tone normal, muscle strength 5/5 throughout.  Sensation to light touch, temperature and vibration intact.  Deep tendon reflexes 2+ throughout, toes downgoing.  Finger to nose and heel to shin testing intact.  Gait wide-based and appears antalgic, Romberg negative.  IMPRESSION: Dysesthesias in lower extremities.  Consider peripheral neuropathy.  May be secondary to lumbar stenosis as well.  PLAN: 1.  Will provide samples of Lyrica 75mg  twice daily for pain control 2.  Will check NCV-EMG of lower extremities 3.  Will check B12 level.  Also consider 2 hour glucose tolerance test (given history of prediabetes) 4.  Follow up after testing.  Metta Clines, DO  CC: Billey Gosling, MD

## 2016-10-19 LAB — URINE CULTURE

## 2016-10-22 ENCOUNTER — Other Ambulatory Visit (HOSPITAL_COMMUNITY): Payer: BLUE CROSS/BLUE SHIELD

## 2016-10-25 ENCOUNTER — Ambulatory Visit (INDEPENDENT_AMBULATORY_CARE_PROVIDER_SITE_OTHER): Payer: BLUE CROSS/BLUE SHIELD | Admitting: Neurology

## 2016-10-25 ENCOUNTER — Telehealth (INDEPENDENT_AMBULATORY_CARE_PROVIDER_SITE_OTHER): Payer: Self-pay | Admitting: Orthopedic Surgery

## 2016-10-25 ENCOUNTER — Ambulatory Visit (INDEPENDENT_AMBULATORY_CARE_PROVIDER_SITE_OTHER): Payer: BLUE CROSS/BLUE SHIELD | Admitting: Orthopedic Surgery

## 2016-10-25 ENCOUNTER — Encounter (INDEPENDENT_AMBULATORY_CARE_PROVIDER_SITE_OTHER): Payer: Self-pay | Admitting: Orthopedic Surgery

## 2016-10-25 DIAGNOSIS — G629 Polyneuropathy, unspecified: Secondary | ICD-10-CM

## 2016-10-25 DIAGNOSIS — M1711 Unilateral primary osteoarthritis, right knee: Secondary | ICD-10-CM

## 2016-10-25 NOTE — Telephone Encounter (Signed)
She will drop off another form and I will get it completed for her as soon as I can. She asked that it be faxed. She is asking it be done this afternoon. Advised patient likely that I would not get it done today since it is so close to closing. She asked that it be done first thing in the morning. I advised would likely not get to it then either since we do have a full clinic. I told her I would try and get to it tomorrow afternoon to get it completed and faxed back for her.

## 2016-10-25 NOTE — Procedures (Signed)
[facility] Neu[facility]  [address]  [place], [place] [address] Tel: [phone] Fax:  [phone] Test Date:  10/25/2016  Patient: Janet Le DOB: June 21, 1956 Physician: Narda Amber, DO  Sex: Female Height: 5\' 1"  Ref Phys: Metta Clines, D.O.  ID#: [id number] Temp: 33.6C Technician:    Patient Complaints: This is a 61 year-old female referred for evaluation of feet paresthesias.  NCV & EMG Findings: Extensive electrodiagnostic testing of the right lower extremity and additional studies of the left shows:  1. Bilateral sural and superficial peroneal sensory responses are within normal limits. 2. Bilateral peroneal motor responses are within normal limits. The tibial motor responses are abnormal and may be due to technical challenges due to lower extremity edema; tibial motor response on the right is reduced and absent on the left. 3. Bilateral tibial H reflex studies are within normal limits. 4. There is no evidence of active or chronic motor axon loss changes affecting any of the tested muscles. Motor unit configuration and recruitment pattern is within normal limits.  Impression: Bilateral tibial motor responses show reduced amplitude. In isolation, these findings are of uncertain clinical significance and most likely due to technical limitations due to lower extremity edema.  There is no evidence of a large fiber sensorimotor polyneuropathy or lumbosacral radiculopathy affecting the lower extremities.   ___________________________ Narda Amber, DO    Nerve Conduction Studies Anti Sensory Summary Table   Site NR Peak (ms) Norm Peak (ms) P-T Amp (V) Norm P-T Amp  Left Sup Peroneal Anti Sensory (Ant Lat Mall)  12 cm    2.7 <4.6 3.9 >3  Right Sup Peroneal Anti Sensory (Ant Lat Mall)  12 cm    2.3 <4.6 3.4 >3  Left Sural Anti Sensory (Lat Mall)  Calf    2.5 <4.6 3.1 >3  Right Sural Anti Sensory (Lat Mall)  Calf    3.1 <4.6 4.1 >3   Motor Summary Table   Site NR Onset (ms) Norm Onset (ms) O-P Amp (mV) Norm O-P Amp Site1 Site2 Delta-0 (ms) Dist (cm) Vel (m/s) Norm Vel (m/s)  Left Peroneal Motor (Ext Dig Brev)  Ankle    3.2 <6.0 2.9 >2.5 B Fib Ankle 7.6 35.0 46 >40  B Fib    10.8  2.5  Poplt B Fib 1.6 10.0 63 >40  Poplt    12.4  2.4         Right Peroneal Motor (Ext Dig Brev)  Ankle    3.1 <6.0 3.7 >2.5 B Fib Ankle 7.4 42.0 57 >40  B Fib    10.5  3.8  Poplt B Fib 1.7 10.0 59 >40  Poplt    12.2  3.7         Left Tibial Motor (Abd Hall Brev)  Lower extremity edema  Ankle NR  <6.0  >4 Knee Ankle  0.0  >40  Knee NR            Right Tibial Motor (Abd Hall Brev)    Lower extremity edema  Ankle    5.3 <6.0 3.1 >4 Knee Ankle 9.3 41.0 44 >40  Knee    14.6  2.2          H Reflex Studies   NR H-Lat (ms) Lat Norm (ms) L-R H-Lat (ms)  Left Tibial (Gastroc)     34.15 <35 0.00  Right Tibial (Gastroc)     34.15 <35 0.00   EMG   Side Muscle Ins Act Fibs Psw Fasc  Number Recrt Dur Dur. Amp Amp. Poly Poly. Comment  Right AntTibialis Nml Nml Nml Nml Nml Nml Nml Nml Nml Nml Nml Nml N/A  Right Gastroc Nml Nml Nml Nml Nml Nml Nml Nml Nml Nml Nml Nml N/A  Right Flex Dig Long Nml Nml Nml Nml Nml Nml Nml Nml Nml Nml Nml Nml N/A  Right RectFemoris Nml Nml Nml Nml Nml Nml Nml Nml Nml Nml Nml Nml N/A  Right GluteusMed Nml Nml Nml Nml Nml Nml Nml Nml Nml Nml Nml Nml N/A  Right BicepsFemS Nml Nml Nml Nml Nml Nml Nml Nml Nml Nml Nml Nml N/A  Left AntTibialis Nml Nml Nml Nml Nml Nml Nml Nml Nml Nml Nml Nml N/A  Left Gastroc Nml Nml Nml Nml Nml Nml Nml Nml Nml Nml Nml Nml N/A  Left Flex Dig Long Nml Nml Nml Nml Nml Nml Nml Nml Nml Nml Nml Nml N/A  Left RectFemoris Nml Nml Nml Nml Nml Nml Nml Nml Nml Nml Nml Nml N/A  Left GluteusMed Nml Nml Nml Nml Nml Nml Nml Nml Nml Nml Nml Nml N/A  Left BicepsFemS Nml Nml Nml Nml Nml Nml Nml Nml Nml Nml Nml Nml N/A      Waveforms:

## 2016-10-25 NOTE — Telephone Encounter (Signed)
Patient has a few questions she would like to ask prior to her surgery that she would like to discuss with

## 2016-10-25 NOTE — Telephone Encounter (Signed)
Pt called about a form she had you guys fill out for an application for transportation after her surgery. She has misplaced it and wanted to know if you or Dr. Marlou Sa kept a copy? Please call pt

## 2016-10-25 NOTE — Progress Notes (Signed)
Office Visit Note   Patient: Janet Le           Date of Birth: 02-20-1956           MRN: [id number] Visit Date: 10/25/2016 Requested by: Binnie Rail, MD [place], [place] [address] PCP: Binnie Rail, MD  Subjective: Chief Complaint  Patient presents with  . Knee Pain    pre-op consult-patient has questions about upcoming surgery    HPI: [name] is a 61 year old patient with right knee pain.  Scheduled for knee replacement next week.  He is having some pain in the knee and wants to discuss a few issues prior to surgery.  Patient is a Sales promotion account executive [employer] and the desire to not accept any blood products as discussed.  She has no history of DVT or pulmonary embolism and therefore we will use a topical and intravenous transient week acid.  I discussed the total knee replacement with use of models and showed her the procedure in detail in terms of explain the amount of bone that was cut off.  Also explained the rehabilitative process.  Currently there is no issues with the knee.  Discussed her family situation in terms of rehabilitation and possible need for skilled nursing home placement.              ROS: All systems reviewed are negative as they relate to the chief complaint within the history of present illness.  Patient denies  fevers or chills.   Assessment & Plan: Visit Diagnoses:  1. Unilateral primary osteoarthritis, right knee     Plan: Impression is right knee pain with arthritis but reasonable range of motion no skin problems at this time.  Patient hyperextends about 7-10.  I think in general she might be a good candidate for press-fit prosthesis if her bone quality is good enough.  If not then we will proceed with cemented stemmed prosthesis.  Risks and benefits of surgery discussed included but limited to infection or vessel damage incomplete healing.  All questions answered.  No real signs or symptoms of metal allergy.  Follow-Up Instructions: No Follow-up on  file.   Orders:  No orders of the defined types were placed in this encounter.  No orders of the defined types were placed in this encounter.     Procedures: No procedures performed   Clinical Data: No additional findings.  Objective: Vital Signs: There were no vitals taken for this visit.  Physical Exam:   Constitutional: Patient appears well-developed HEENT:  Head: Normocephalic Eyes:EOM are normal Neck: Normal range of motion Cardiovascular: Normal rate Pulmonary/chest: Effort normal Neurologic: Patient is alert Skin: Skin is warm Psychiatric: Patient has normal mood and affect    Ortho Exam: Right knee exam demonstrates reasonable range of motion from 7 of hyperextension to about 110-115 of flexion.  Collateral crucial ligaments are stable.  Extensor mechanism is intact.  Mild knee effusion is present.  Skin is intact in the right knee region.  Specialty Comments:  No specialty comments available.  Imaging: No results found.   PMFS History: Patient Active Problem List   Diagnosis Date Noted  . Burning pain 10/15/2016  . Low back pain 08/01/2016  . Chronic low back pain 07/18/2016  . Prediabetes 05/30/2016  . Cervicalgia 05/30/2016  . Right knee pain 05/30/2016  . Osteopenia 11/18/2015  . Cephalalgia 07/20/2015  . Thrombocytopenia ([place]) 05/30/2015  . Arthritis of left lower extremity 02/02/2015  . Left knee pain 09/08/2014  .  Gout 12/11/2013  . Vision disturbance 12/08/2013  . Severe obesity (BMI >= 40) ([place]) 11/06/2013  . Osteoporosis   . Left lumbar radiculopathy 04/13/2012  . Renal cell cancer ([place])   . External hemorrhoid 11/14/2010  . IBS (irritable bowel syndrome) 11/14/2010  . GERD (gastroesophageal reflux disease) 11/14/2010  . HYPERCHOLESTEROLEMIA, MILD 03/16/2010  . Lymphedema 03/15/2010  . ALLERGIC RHINITIS 08/31/2009  . MIGRAINE HEADACHE 06/14/2009  . SINUSITIS, CHRONIC 06/14/2009  . SYNCOPE 05/16/2009  . Essential hypertension  05/06/2009  . CONSTIPATION, CHRONIC 05/06/2009   Past Medical History:  Diagnosis Date  . Allergic rhinitis, cause unspecified   . Anxiety   . Blood dyscrasia    platelets low in past  . Chronic back pain   . Chronic constipation   . Chronic sinusitis   . Depression   . Endometriosis   . ENDOMETRIOSIS 05/06/2009   Qualifier: Diagnosis of  By: Varney Daily RN, Butch Penny    . Esophageal stricture   . GERD (gastroesophageal reflux disease)   . Hemorrhoids   . Hiatal hernia   . Hiatal hernia   . Hyperlipidemia   . Hypertension   . Irritable bowel syndrome   . Osteoarthritis   . Personal history of diabetes mellitus    controlled with diet only  . Renal cell cancer ([place]) 11/2010 dx   R, s/p cryoablation 02/16/11  . Syncopal episodes    since childhood    Family History  Problem Relation Age of Onset  . Diabetes Mother   . Kidney disease Father   . Prostate cancer Father   . Colitis Brother   . Leukemia Brother   . Multiple sclerosis Sister   . Colon polyps    . Colon cancer Neg Hx     Past Surgical History:  Procedure Laterality Date  . BREAST SURGERY     bilateral, cyst removal  . fallopian tube removed    . FUNCTIONAL ENDOSCOPIC SINUS SURGERY    . LASER ABLATION OF THE CERVIX    . 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 TUBAL LIGATION     Social History   Occupational History  . 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 Smoking status: Never Smoker  . Smokeless tobacco: Never Used  . Alcohol use No     Comment: rare  . Drug use: No  . Sexual activity: No

## 2016-10-25 NOTE — Telephone Encounter (Signed)
Advised patient no copy kept and original given back to patient.

## 2016-10-26 ENCOUNTER — Telehealth: Payer: Self-pay

## 2016-10-26 DIAGNOSIS — R52 Pain, unspecified: Secondary | ICD-10-CM

## 2016-10-26 DIAGNOSIS — R7303 Prediabetes: Secondary | ICD-10-CM

## 2016-10-26 NOTE — Telephone Encounter (Signed)
Spoke with patient regarding nerve study.  Advised her Dr Tomi Likens recommends 2 hour glucose study.  Order placed and patient will come at her convenience.

## 2016-10-26 NOTE — Telephone Encounter (Signed)
Left message for patient to call office to inform her of nerve study results and 2 hour glucose recommendation.  Order has been placed for lab work.

## 2016-10-26 NOTE — Telephone Encounter (Signed)
-----   Message from Pieter Partridge, DO sent at 10/26/2016  7:14 AM EDT ----- Nerve test was normal.  I would like to order a 2 hour glucose tolerance test to evaluate for pre-diabetes, which may be a cause of burning sensation in the feet.

## 2016-10-29 MED ORDER — TRANEXAMIC ACID 1000 MG/10ML IV SOLN
1000.0000 mg | INTRAVENOUS | Status: AC
  Administered 2016-10-30: 1000 mg via INTRAVENOUS
  Filled 2016-10-29: qty 10

## 2016-10-30 ENCOUNTER — Inpatient Hospital Stay (HOSPITAL_COMMUNITY): Payer: BLUE CROSS/BLUE SHIELD | Admitting: Emergency Medicine

## 2016-10-30 ENCOUNTER — Encounter (HOSPITAL_COMMUNITY)
Admission: RE | Disposition: A | Discharge status: Home Health | Payer: Self-pay | Source: Ambulatory Visit | Attending: Orthopedic Surgery

## 2016-10-30 ENCOUNTER — Inpatient Hospital Stay (HOSPITAL_COMMUNITY): Payer: BLUE CROSS/BLUE SHIELD | Admitting: Critical Care Medicine

## 2016-10-30 ENCOUNTER — Inpatient Hospital Stay (HOSPITAL_COMMUNITY)
Admission: RE | Admit: 2016-10-30 | Discharge: 2016-11-03 | DRG: 470 | Disposition: A | Discharge status: Home Health | Payer: BLUE CROSS/BLUE SHIELD | Source: Ambulatory Visit | Attending: Orthopedic Surgery | Admitting: Orthopedic Surgery

## 2016-10-30 ENCOUNTER — Encounter (HOSPITAL_COMMUNITY): Payer: Self-pay | Admitting: Critical Care Medicine

## 2016-10-30 DIAGNOSIS — Z7983 Long term (current) use of bisphosphonates: Secondary | ICD-10-CM | POA: Diagnosis not present

## 2016-10-30 DIAGNOSIS — E782 Mixed hyperlipidemia: Secondary | ICD-10-CM | POA: Diagnosis not present

## 2016-10-30 DIAGNOSIS — G8918 Other acute postprocedural pain: Secondary | ICD-10-CM | POA: Diagnosis not present

## 2016-10-30 DIAGNOSIS — Z79899 Other long term (current) drug therapy: Secondary | ICD-10-CM

## 2016-10-30 DIAGNOSIS — Z9889 Other specified postprocedural states: Secondary | ICD-10-CM

## 2016-10-30 DIAGNOSIS — M1711 Unilateral primary osteoarthritis, right knee: Secondary | ICD-10-CM | POA: Diagnosis not present

## 2016-10-30 DIAGNOSIS — Z794 Long term (current) use of insulin: Secondary | ICD-10-CM

## 2016-10-30 DIAGNOSIS — E119 Type 2 diabetes mellitus without complications: Secondary | ICD-10-CM | POA: Diagnosis present

## 2016-10-30 DIAGNOSIS — I10 Essential (primary) hypertension: Secondary | ICD-10-CM | POA: Diagnosis not present

## 2016-10-30 DIAGNOSIS — M1710 Unilateral primary osteoarthritis, unspecified knee: Secondary | ICD-10-CM | POA: Diagnosis present

## 2016-10-30 DIAGNOSIS — Z96651 Presence of right artificial knee joint: Secondary | ICD-10-CM | POA: Diagnosis not present

## 2016-10-30 HISTORY — PX: TOTAL KNEE ARTHROPLASTY: SHX125

## 2016-10-30 HISTORY — DX: Other specified postprocedural states: Z98.890

## 2016-10-30 LAB — BASIC METABOLIC PANEL
Anion gap: 5 (ref 5–15)
BUN: 15 mg/dL (ref 6–20)
CO2: 26 mmol/L (ref 22–32)
Calcium: 9.8 mg/dL (ref 8.9–10.3)
Chloride: 109 mmol/L (ref 101–111)
Creatinine, Ser: 0.83 mg/dL (ref 0.44–1.00)
GFR calc Af Amer: >60 mL/min (ref >60)
GFR calc non Af Amer: >60 mL/min (ref >60)
Glucose, Bld: 82 mg/dL (ref 65–99)
Potassium: 3.9 mmol/L (ref 3.5–5.1)
Sodium: 140 mmol/L (ref 135–145)

## 2016-10-30 LAB — GLUCOSE, CAPILLARY
Glucose-Capillary: 86 mg/dL (ref 65–99)
Glucose-Capillary: 79 mg/dL (ref 65–99)
Glucose-Capillary: 83 mg/dL (ref 65–99)
Glucose-Capillary: 144 mg/dL — ABNORMAL HIGH (ref 65–99)
Glucose-Capillary: 77 mg/dL (ref 65–99)
Glucose-Capillary: 113 mg/dL — ABNORMAL HIGH (ref 65–99)
Glucose-Capillary: 121 mg/dL — ABNORMAL HIGH (ref 65–99)
Glucose-Capillary: 119 mg/dL — ABNORMAL HIGH (ref 65–99)

## 2016-10-30 LAB — NO BLOOD PRODUCTS

## 2016-10-30 SURGERY — ARTHROPLASTY, KNEE, TOTAL
Anesthesia: General | Site: Knee | Laterality: Right

## 2016-10-30 MED ORDER — ONDANSETRON HCL 4 MG/2ML IJ SOLN: 4.0000 mg | Freq: Once | INTRAMUSCULAR | Status: DC | PRN

## 2016-10-30 MED ORDER — EPHEDRINE SULFATE-NACL 50-0.9 MG/10ML-% IV SOSY
PREFILLED_SYRINGE | INTRAVENOUS | Status: DC | PRN
  Administered 2016-10-30 (×3): 2.5 mg via INTRAVENOUS
  Administered 2016-10-30: 5 mg via INTRAVENOUS
  Administered 2016-10-30: 2.5 mg via INTRAVENOUS
  Administered 2016-10-30: 5 mg via INTRAVENOUS

## 2016-10-30 MED ORDER — MORPHINE SULFATE (PF) 4 MG/ML IV SOLN
INTRAVENOUS | Status: AC
  Filled 2016-10-30: qty 2

## 2016-10-30 MED ORDER — SUGAMMADEX SODIUM 200 MG/2ML IV SOLN
INTRAVENOUS | Status: AC
  Filled 2016-10-30: qty 2

## 2016-10-30 MED ORDER — ATORVASTATIN CALCIUM 10 MG PO TABS
10.0000 mg | ORAL_TABLET | Freq: Every day | ORAL | Status: DC
  Administered 2016-10-30 – 2016-11-01 (×3): 10 mg via ORAL
  Filled 2016-10-30 (×3): qty 1

## 2016-10-30 MED ORDER — ROCURONIUM BROMIDE 10 MG/ML (PF) SYRINGE
PREFILLED_SYRINGE | INTRAVENOUS | Status: AC
  Filled 2016-10-30: qty 5

## 2016-10-30 MED ORDER — LACTATED RINGERS IV SOLN
INTRAVENOUS | Status: DC | PRN
  Administered 2016-10-30 (×2): via INTRAVENOUS

## 2016-10-30 MED ORDER — MORPHINE SULFATE (PF) 4 MG/ML IV SOLN
INTRAVENOUS | Status: DC | PRN
  Administered 2016-10-30: 8 mg

## 2016-10-30 MED ORDER — LACTATED RINGERS IV SOLN
Freq: Once | INTRAVENOUS | Status: AC
  Administered 2016-10-30: 10:00:00 via INTRAVENOUS

## 2016-10-30 MED ORDER — ONDANSETRON HCL 4 MG/2ML IJ SOLN
INTRAMUSCULAR | Status: DC | PRN
  Administered 2016-10-30: 4 mg via INTRAVENOUS

## 2016-10-30 MED ORDER — EPHEDRINE 5 MG/ML INJ
INTRAVENOUS | Status: AC
  Filled 2016-10-30: qty 10

## 2016-10-30 MED ORDER — NYSTATIN 100000 UNIT/ML MT SUSP
5.0000 mL | Freq: Three times a day (TID) | OROMUCOSAL | Status: DC
  Administered 2016-10-31 – 2016-11-03 (×10): 500000 [IU] via ORAL
  Filled 2016-10-30 (×13): qty 5

## 2016-10-30 MED ORDER — CHLORHEXIDINE GLUCONATE 4 % EX LIQD: 60.0000 mL | Freq: Once | CUTANEOUS | Status: DC

## 2016-10-30 MED ORDER — CLONIDINE HCL (ANALGESIA) 100 MCG/ML EP SOLN
EPIDURAL | Status: DC | PRN
  Administered 2016-10-30: 100 ug

## 2016-10-30 MED ORDER — 0.9 % SODIUM CHLORIDE (POUR BTL) OPTIME
TOPICAL | Status: DC | PRN
Start: 2016-10-30 — End: 2016-10-30
  Administered 2016-10-30 (×3): 1000 mL

## 2016-10-30 MED ORDER — PREGABALIN 75 MG PO CAPS: 75.0000 mg | ORAL_CAPSULE | Freq: Two times a day (BID) | ORAL | Status: DC

## 2016-10-30 MED ORDER — FENTANYL CITRATE (PF) 100 MCG/2ML IJ SOLN
25.0000 ug | INTRAMUSCULAR | Status: DC | PRN
  Administered 2016-10-30 (×3): 25 ug via INTRAVENOUS

## 2016-10-30 MED ORDER — MORPHINE SULFATE (PF) 4 MG/ML IV SOLN
4.0000 mg | Freq: Three times a day (TID) | INTRAVENOUS | Status: DC | PRN
Start: 2016-10-30 — End: 2016-11-03
  Administered 2016-10-31 – 2016-11-03 (×8): 4 mg via INTRAVENOUS
  Filled 2016-10-30 (×8): qty 1

## 2016-10-30 MED ORDER — DOCUSATE SODIUM 100 MG PO CAPS
100.0000 mg | ORAL_CAPSULE | Freq: Two times a day (BID) | ORAL | Status: DC
  Administered 2016-10-30 – 2016-11-03 (×8): 100 mg via ORAL
  Filled 2016-10-30 (×8): qty 1

## 2016-10-30 MED ORDER — ROCURONIUM BROMIDE 10 MG/ML (PF) SYRINGE
PREFILLED_SYRINGE | INTRAVENOUS | Status: DC | PRN
  Administered 2016-10-30: 50 mg via INTRAVENOUS

## 2016-10-30 MED ORDER — MIDAZOLAM HCL 2 MG/2ML IJ SOLN
INTRAMUSCULAR | Status: AC
  Filled 2016-10-30: qty 2

## 2016-10-30 MED ORDER — TRANEXAMIC ACID 1000 MG/10ML IV SOLN
INTRAVENOUS | Status: DC | PRN
  Administered 2016-10-30: 2000 mg via TOPICAL

## 2016-10-30 MED ORDER — METHOCARBAMOL 500 MG PO TABS
500.0000 mg | ORAL_TABLET | Freq: Four times a day (QID) | ORAL | Status: DC | PRN
  Administered 2016-10-30 – 2016-11-01 (×4): 500 mg via ORAL
  Filled 2016-10-30 (×4): qty 1

## 2016-10-30 MED ORDER — LIDOCAINE 2% (20 MG/ML) 5 ML SYRINGE
INTRAMUSCULAR | Status: AC
  Filled 2016-10-30: qty 15

## 2016-10-30 MED ORDER — CEFAZOLIN SODIUM-DEXTROSE 2-4 GM/100ML-% IV SOLN
2.0000 g | INTRAVENOUS | Status: AC
  Administered 2016-10-30: 2 g via INTRAVENOUS
  Filled 2016-10-30: qty 100

## 2016-10-30 MED ORDER — METOCLOPRAMIDE HCL 5 MG/ML IJ SOLN
5.0000 mg | Freq: Three times a day (TID) | INTRAMUSCULAR | Status: DC | PRN
Start: 2016-10-30 — End: 2016-11-03

## 2016-10-30 MED ORDER — FENTANYL CITRATE (PF) 100 MCG/2ML IJ SOLN
INTRAMUSCULAR | Status: DC | PRN
  Administered 2016-10-30: 100 ug via INTRAVENOUS
  Administered 2016-10-30 (×2): 25 ug via INTRAVENOUS
  Administered 2016-10-30 (×2): 50 ug via INTRAVENOUS

## 2016-10-30 MED ORDER — BUPIVACAINE LIPOSOME 1.3 % IJ SUSP
20.0000 mL | INTRAMUSCULAR | Status: DC
  Filled 2016-10-30: qty 20

## 2016-10-30 MED ORDER — MIDAZOLAM HCL 2 MG/2ML IJ SOLN
1.0000 mg | Freq: Once | INTRAMUSCULAR | Status: AC
  Administered 2016-10-30: 1 mg via INTRAVENOUS
  Filled 2016-10-30: qty 1

## 2016-10-30 MED ORDER — PROPRANOLOL HCL 20 MG PO TABS
80.0000 mg | ORAL_TABLET | Freq: Two times a day (BID) | ORAL | Status: DC
  Administered 2016-10-31 – 2016-11-03 (×7): 80 mg via ORAL
  Filled 2016-10-30 (×8): qty 4

## 2016-10-30 MED ORDER — TRAMADOL HCL 50 MG PO TABS
50.0000 mg | ORAL_TABLET | Freq: Four times a day (QID) | ORAL | Status: DC
  Administered 2016-10-30 – 2016-11-03 (×16): 50 mg via ORAL
  Filled 2016-10-30 (×17): qty 1

## 2016-10-30 MED ORDER — CEFAZOLIN SODIUM-DEXTROSE 2-4 GM/100ML-% IV SOLN
2.0000 g | Freq: Four times a day (QID) | INTRAVENOUS | Status: AC
  Administered 2016-10-30 – 2016-10-31 (×2): 2 g via INTRAVENOUS
  Filled 2016-10-30 (×2): qty 100

## 2016-10-30 MED ORDER — MENTHOL 3 MG MT LOZG: 1.0000 | LOZENGE | OROMUCOSAL | Status: DC | PRN

## 2016-10-30 MED ORDER — TRANEXAMIC ACID 1000 MG/10ML IV SOLN
2000.0000 mg | Freq: Once | INTRAVENOUS | Status: DC
  Filled 2016-10-30: qty 20

## 2016-10-30 MED ORDER — BUPIVACAINE HCL (PF) 0.5 % IJ SOLN
INTRAMUSCULAR | Status: DC | PRN
  Administered 2016-10-30: 20 mL

## 2016-10-30 MED ORDER — MIDAZOLAM HCL 2 MG/2ML IJ SOLN
INTRAMUSCULAR | Status: AC
  Administered 2016-10-30: 1 mg via INTRAVENOUS
  Filled 2016-10-30: qty 2

## 2016-10-30 MED ORDER — ACETAMINOPHEN 650 MG RE SUPP: 650.0000 mg | Freq: Four times a day (QID) | RECTAL | Status: DC | PRN

## 2016-10-30 MED ORDER — DEXTROSE 50 % IV SOLN
INTRAVENOUS | Status: AC
  Administered 2016-10-30: 25 mL via INTRAVENOUS
  Filled 2016-10-30: qty 50

## 2016-10-30 MED ORDER — BUPIVACAINE LIPOSOME 1.3 % IJ SUSP
INTRAMUSCULAR | Status: DC | PRN
  Administered 2016-10-30: 10 mL
  Administered 2016-10-30: 20 mL

## 2016-10-30 MED ORDER — PROPOFOL 10 MG/ML IV BOLUS
INTRAVENOUS | Status: DC | PRN
  Administered 2016-10-30: 120 mg via INTRAVENOUS

## 2016-10-30 MED ORDER — FENTANYL CITRATE (PF) 250 MCG/5ML IJ SOLN
INTRAMUSCULAR | Status: AC
  Filled 2016-10-30: qty 5

## 2016-10-30 MED ORDER — ONDANSETRON HCL 4 MG/2ML IJ SOLN
INTRAMUSCULAR | Status: AC
  Filled 2016-10-30: qty 2

## 2016-10-30 MED ORDER — FENTANYL CITRATE (PF) 100 MCG/2ML IJ SOLN
INTRAMUSCULAR | Status: AC
  Filled 2016-10-30: qty 2

## 2016-10-30 MED ORDER — ZOLPIDEM TARTRATE 5 MG PO TABS: 5.0000 mg | ORAL_TABLET | Freq: Every evening | ORAL | Status: DC | PRN

## 2016-10-30 MED ORDER — METHOCARBAMOL 1000 MG/10ML IJ SOLN
500.0000 mg | Freq: Four times a day (QID) | INTRAVENOUS | Status: DC | PRN
  Filled 2016-10-30: qty 5

## 2016-10-30 MED ORDER — MIDAZOLAM HCL 5 MG/5ML IJ SOLN
INTRAMUSCULAR | Status: DC | PRN
  Administered 2016-10-30: 2 mg via INTRAVENOUS

## 2016-10-30 MED ORDER — CLONIDINE HCL (ANALGESIA) 100 MCG/ML EP SOLN
150.0000 ug | EPIDURAL | Status: DC
  Filled 2016-10-30: qty 1.5

## 2016-10-30 MED ORDER — FUROSEMIDE 20 MG PO TABS
20.0000 mg | ORAL_TABLET | Freq: Every day | ORAL | Status: DC
  Administered 2016-10-30 – 2016-11-03 (×4): 20 mg via ORAL
  Filled 2016-10-30 (×5): qty 1

## 2016-10-30 MED ORDER — DEXTROSE 50 % IV SOLN
0.5000 | Freq: Once | INTRAVENOUS | Status: AC
  Administered 2016-10-30: 25 mL via INTRAVENOUS
  Filled 2016-10-30: qty 50

## 2016-10-30 MED ORDER — AMLODIPINE BESYLATE 10 MG PO TABS
10.0000 mg | ORAL_TABLET | Freq: Every day | ORAL | Status: DC
Start: 2016-10-31 — End: 2016-11-03
  Administered 2016-10-31 – 2016-11-03 (×4): 10 mg via ORAL
  Filled 2016-10-30 (×4): qty 1

## 2016-10-30 MED ORDER — ONDANSETRON HCL 4 MG/2ML IJ SOLN
4.0000 mg | Freq: Four times a day (QID) | INTRAMUSCULAR | Status: DC | PRN
  Administered 2016-11-02 – 2016-11-03 (×5): 4 mg via INTRAVENOUS
  Filled 2016-10-30 (×5): qty 2

## 2016-10-30 MED ORDER — IRBESARTAN 75 MG PO TABS
37.5000 mg | ORAL_TABLET | Freq: Every day | ORAL | Status: DC
  Administered 2016-10-30 – 2016-11-03 (×5): 37.5 mg via ORAL
  Filled 2016-10-30 (×5): qty 0.5

## 2016-10-30 MED ORDER — BUPIVACAINE HCL (PF) 0.5 % IJ SOLN
INTRAMUSCULAR | Status: AC
  Filled 2016-10-30: qty 30

## 2016-10-30 MED ORDER — SUGAMMADEX SODIUM 200 MG/2ML IV SOLN
INTRAVENOUS | Status: DC | PRN
  Administered 2016-10-30: 200 mg via INTRAVENOUS

## 2016-10-30 MED ORDER — FENTANYL CITRATE (PF) 100 MCG/2ML IJ SOLN
INTRAMUSCULAR | Status: AC
  Administered 2016-10-30: 50 ug via INTRAVENOUS
  Filled 2016-10-30: qty 2

## 2016-10-30 MED ORDER — PHENOL 1.4 % MT LIQD: 1.0000 | OROMUCOSAL | Status: DC | PRN

## 2016-10-30 MED ORDER — SODIUM CHLORIDE 0.9% FLUSH
INTRAVENOUS | Status: DC | PRN
Start: 2016-10-30 — End: 2016-10-30
  Administered 2016-10-30: 20 mL

## 2016-10-30 MED ORDER — POTASSIUM CHLORIDE IN NACL 20-0.9 MEQ/L-% IV SOLN
INTRAVENOUS | Status: AC
  Administered 2016-10-31: via INTRAVENOUS
  Filled 2016-10-30: qty 1000

## 2016-10-30 MED ORDER — RIVAROXABAN 10 MG PO TABS
10.0000 mg | ORAL_TABLET | Freq: Every day | ORAL | Status: DC
  Administered 2016-10-31 – 2016-11-03 (×4): 10 mg via ORAL
  Filled 2016-10-30 (×4): qty 1

## 2016-10-30 MED ORDER — SODIUM CHLORIDE 0.9 % IR SOLN
Status: DC | PRN
  Administered 2016-10-30: 3000 mL

## 2016-10-30 MED ORDER — MORPHINE SULFATE (PF) 4 MG/ML IV SOLN: 4.0000 mg | INTRAVENOUS | Status: DC | PRN

## 2016-10-30 MED ORDER — MORPHINE SULFATE 15 MG PO TABS
15.0000 mg | ORAL_TABLET | ORAL | Status: DC | PRN
  Administered 2016-10-30: 15 mg via ORAL
  Filled 2016-10-30 (×2): qty 1

## 2016-10-30 MED ORDER — LIDOCAINE 2% (20 MG/ML) 5 ML SYRINGE
INTRAMUSCULAR | Status: DC | PRN
  Administered 2016-10-30: 60 mg via INTRAVENOUS

## 2016-10-30 MED ORDER — LINACLOTIDE 145 MCG PO CAPS
145.0000 ug | ORAL_CAPSULE | Freq: Every day | ORAL | Status: DC | PRN
  Administered 2016-11-02: 145 ug via ORAL
  Filled 2016-10-30 (×4): qty 1

## 2016-10-30 MED ORDER — PROPOFOL 10 MG/ML IV BOLUS
INTRAVENOUS | Status: AC
  Filled 2016-10-30: qty 20

## 2016-10-30 MED ORDER — METOCLOPRAMIDE HCL 5 MG PO TABS
5.0000 mg | ORAL_TABLET | Freq: Three times a day (TID) | ORAL | Status: DC | PRN
  Administered 2016-10-31: 10 mg via ORAL
  Filled 2016-10-30: qty 2

## 2016-10-30 MED ORDER — ACETAMINOPHEN 325 MG PO TABS
650.0000 mg | ORAL_TABLET | Freq: Four times a day (QID) | ORAL | Status: DC | PRN
  Administered 2016-10-31 (×2): 650 mg via ORAL
  Filled 2016-10-30 (×2): qty 2

## 2016-10-30 MED ORDER — DEXAMETHASONE SODIUM PHOSPHATE 10 MG/ML IJ SOLN
INTRAMUSCULAR | Status: DC | PRN
  Administered 2016-10-30: 4 mg via INTRAVENOUS

## 2016-10-30 MED ORDER — FLUTICASONE PROPIONATE 50 MCG/ACT NA SUSP
1.0000 | Freq: Every day | NASAL | Status: DC
  Administered 2016-11-02 – 2016-11-03 (×2): 1 via NASAL
  Filled 2016-10-30: qty 16

## 2016-10-30 MED ORDER — FENTANYL CITRATE (PF) 100 MCG/2ML IJ SOLN
50.0000 ug | Freq: Once | INTRAMUSCULAR | Status: AC
  Administered 2016-10-30: 50 ug via INTRAVENOUS
  Filled 2016-10-30: qty 1

## 2016-10-30 MED ORDER — LIRAGLUTIDE -WEIGHT MANAGEMENT 18 MG/3ML ~~LOC~~ SOPN: 0.6000 mg | PEN_INJECTOR | Freq: Every day | SUBCUTANEOUS | Status: DC

## 2016-10-30 MED ORDER — CALCIUM CARBONATE-VITAMIN D 500-200 MG-UNIT PO TABS
1.0000 | ORAL_TABLET | Freq: Every day | ORAL | Status: DC
  Administered 2016-10-30 – 2016-11-03 (×5): 1 via ORAL
  Filled 2016-10-30 (×5): qty 1

## 2016-10-30 MED ORDER — ONDANSETRON HCL 4 MG PO TABS
4.0000 mg | ORAL_TABLET | Freq: Four times a day (QID) | ORAL | Status: DC | PRN
  Administered 2016-10-30 – 2016-11-01 (×3): 4 mg via ORAL
  Filled 2016-10-30 (×3): qty 1

## 2016-10-30 SURGICAL SUPPLY — 80 items
BAG DECANTER FOR FLEXI CONT (MISCELLANEOUS) ×1 IMPLANT
BANDAGE ACE 4X5 VEL STRL LF (GAUZE/BANDAGES/DRESSINGS) ×2 IMPLANT
BANDAGE ELASTIC 6 VELCRO ST LF (GAUZE/BANDAGES/DRESSINGS) ×1 IMPLANT
BANDAGE ESMARK 6X9 LF (GAUZE/BANDAGES/DRESSINGS) ×1 IMPLANT
BLADE 15 SAFETY STRL DISP (BLADE) ×1 IMPLANT
BLADE SAG 18X100X1.27 (BLADE) ×2 IMPLANT
BLADE SAW SGTL 13.0X1.19X90.0M (BLADE) IMPLANT
BNDG CMPR 9X6 STRL LF SNTH (GAUZE/BANDAGES/DRESSINGS) ×1
BNDG CMPR MED 15X6 ELC VLCR LF (GAUZE/BANDAGES/DRESSINGS) ×1
BNDG COHESIVE 6X5 TAN STRL LF (GAUZE/BANDAGES/DRESSINGS) ×2 IMPLANT
BNDG ELASTIC 6X15 VLCR STRL LF (GAUZE/BANDAGES/DRESSINGS) ×4 IMPLANT
BNDG ESMARK 6X9 LF (GAUZE/BANDAGES/DRESSINGS) ×2
BOWL SMART MIX CTS (DISPOSABLE) ×2 IMPLANT
CAP KNEE TOTAL 3 SIGMA ×1 IMPLANT
CEMENT BONE SIMPLEX SPEEDSET (Cement) ×3 IMPLANT
COVER SURGICAL LIGHT HANDLE (MISCELLANEOUS) ×2 IMPLANT
CUFF TOURNIQUET SINGLE 34IN LL (TOURNIQUET CUFF) ×1 IMPLANT
CUFF TOURNIQUET SINGLE 44IN (TOURNIQUET CUFF) ×1 IMPLANT
DECANTER SPIKE VIAL GLASS SM (MISCELLANEOUS) ×2 IMPLANT
DRAPE INCISE IOBAN 66X45 STRL (DRAPES) IMPLANT
DRAPE ORTHO SPLIT 77X108 STRL (DRAPES) ×6
DRAPE SURG ORHT 6 SPLT 77X108 (DRAPES) ×3 IMPLANT
DRAPE U-SHAPE 47X51 STRL (DRAPES) ×2 IMPLANT
DRSG AQUACEL AG ADV 3.5X10 (GAUZE/BANDAGES/DRESSINGS) ×2 IMPLANT
DRSG AQUACEL AG ADV 3.5X14 (GAUZE/BANDAGES/DRESSINGS) ×1 IMPLANT
DRSG PAD ABDOMINAL 8X10 ST (GAUZE/BANDAGES/DRESSINGS) ×4 IMPLANT
DURAPREP 26ML APPLICATOR (WOUND CARE) ×4 IMPLANT
ELECT CAUTERY BLADE 6.4 (BLADE) ×2 IMPLANT
ELECT REM PT RETURN 9FT ADLT (ELECTROSURGICAL) ×2
ELECTRODE REM PT RTRN 9FT ADLT (ELECTROSURGICAL) ×1 IMPLANT
GAUZE SPONGE 4X4 12PLY STRL (GAUZE/BANDAGES/DRESSINGS) ×2 IMPLANT
GAUZE SPONGE 4X4 16PLY XRAY LF (GAUZE/BANDAGES/DRESSINGS) ×1 IMPLANT
GLOVE BIOGEL PI IND STRL 7.5 (GLOVE) ×1 IMPLANT
GLOVE BIOGEL PI IND STRL 8 (GLOVE) ×1 IMPLANT
GLOVE BIOGEL PI INDICATOR 7.5 (GLOVE) ×1
GLOVE BIOGEL PI INDICATOR 8 (GLOVE) ×1
GLOVE ECLIPSE 7.0 STRL STRAW (GLOVE) ×2 IMPLANT
GLOVE SURG ORTHO 8.0 STRL STRW (GLOVE) ×2 IMPLANT
GLOVE SURG SS PI 6.5 STRL IVOR (GLOVE) ×1 IMPLANT
GOWN STRL REUS W/ TWL LRG LVL3 (GOWN DISPOSABLE) ×3 IMPLANT
GOWN STRL REUS W/TWL LRG LVL3 (GOWN DISPOSABLE) ×6
HANDPIECE INTERPULSE COAX TIP (DISPOSABLE) ×2
HOOD PEEL AWAY FLYTE STAYCOOL (MISCELLANEOUS) ×6 IMPLANT
IMMOBILIZER KNEE 20 (SOFTGOODS) ×1 IMPLANT
IMMOBILIZER KNEE 22 UNIV (SOFTGOODS) ×1 IMPLANT
IMMOBILIZER KNEE 24 THIGH 36 (MISCELLANEOUS) IMPLANT
IMMOBILIZER KNEE 24 UNIV (MISCELLANEOUS)
KIT BASIN OR (CUSTOM PROCEDURE TRAY) ×2 IMPLANT
KIT ROOM TURNOVER OR (KITS) ×2 IMPLANT
MANIFOLD NEPTUNE II (INSTRUMENTS) ×2 IMPLANT
NDL SAFETY ECLIPSE 18X1.5 (NEEDLE) ×1 IMPLANT
NDL SPNL 18GX3.5 QUINCKE PK (NEEDLE) ×1 IMPLANT
NEEDLE HYPO 18GX1.5 SHARP (NEEDLE) ×2
NEEDLE HYPO 22GX1.5 SAFETY (NEEDLE) ×2 IMPLANT
NEEDLE SPNL 18GX3.5 QUINCKE PK (NEEDLE) ×2 IMPLANT
NS IRRIG 1000ML POUR BTL (IV SOLUTION) ×6 IMPLANT
PACK TOTAL JOINT (CUSTOM PROCEDURE TRAY) ×2 IMPLANT
PAD ARMBOARD 7.5X6 YLW CONV (MISCELLANEOUS) ×4 IMPLANT
PAD CAST 4YDX4 CTTN HI CHSV (CAST SUPPLIES) ×1 IMPLANT
PADDING CAST COTTON 4X4 STRL (CAST SUPPLIES) ×2
PADDING CAST COTTON 6X4 STRL (CAST SUPPLIES) ×2 IMPLANT
SET HNDPC FAN SPRY TIP SCT (DISPOSABLE) IMPLANT
SPONGE LAP 18X18 X RAY DECT (DISPOSABLE) IMPLANT
STEM CEMENTED TRIATHLON (Stem) ×1 IMPLANT
STRIP CLOSURE SKIN 1/2X4 (GAUZE/BANDAGES/DRESSINGS) ×4 IMPLANT
SUCTION FRAZIER HANDLE 10FR (MISCELLANEOUS) ×1
SUCTION TUBE FRAZIER 10FR DISP (MISCELLANEOUS) ×1 IMPLANT
SUT MNCRL AB 3-0 PS2 18 (SUTURE) ×2 IMPLANT
SUT VIC AB 0 CT1 27 (SUTURE) ×6
SUT VIC AB 0 CT1 27XBRD ANBCTR (SUTURE) ×3 IMPLANT
SUT VIC AB 1 CT1 27 (SUTURE) ×10
SUT VIC AB 1 CT1 27XBRD ANBCTR (SUTURE) ×5 IMPLANT
SUT VIC AB 2-0 CT1 27 (SUTURE) ×8
SUT VIC AB 2-0 CT1 TAPERPNT 27 (SUTURE) ×4 IMPLANT
SYR 30ML LL (SYRINGE) ×6 IMPLANT
SYR TB 1ML LUER SLIP (SYRINGE) ×2 IMPLANT
TOWEL OR 17X24 6PK STRL BLUE (TOWEL DISPOSABLE) ×4 IMPLANT
TOWEL OR 17X26 10 PK STRL BLUE (TOWEL DISPOSABLE) ×4 IMPLANT
TRAY CATH 16FR W/PLASTIC CATH (SET/KITS/TRAYS/PACK) IMPLANT
WATER STERILE IRR 1000ML POUR (IV SOLUTION) IMPLANT

## 2016-10-30 NOTE — Progress Notes (Signed)
Pt is adamant she is not a diabetic.  She reports she takes insulin product for weight management.

## 2016-10-30 NOTE — Anesthesia Procedure Notes (Signed)
Procedure Name: Intubation Date/Time: 10/30/2016 12:45 PM Performed by: Merrilyn Puma B Pre-anesthesia Checklist: Patient identified, Emergency Drugs available, Suction available, Patient being monitored and Timeout performed Patient Re-evaluated:Patient Re-evaluated prior to inductionOxygen Delivery Method: Circle system utilized Preoxygenation: Pre-oxygenation with 100% oxygen Intubation Type: IV induction Ventilation: Mask ventilation without difficulty and Oral airway inserted - appropriate to patient size Laryngoscope Size: Mac and 4 Grade View: Grade II Tube type: Oral Tube size: 7.5 mm Number of attempts: 2 Airway Equipment and Method: Stylet Placement Confirmation: ETT inserted through vocal cords under direct vision,  positive ETCO2,  CO2 detector and breath sounds checked- equal and bilateral Secured at: 21 cm Tube secured with: Tape Dental Injury: Teeth and Oropharynx as per pre-operative assessment

## 2016-10-30 NOTE — H&P (Signed)
TOTAL KNEE ADMISSION H&P  Patient is being admitted for right total knee arthroplasty.  Subjective:  Chief Complaint:right knee pain.  HPI: Janet Le, 61 y.o. female, has a history of pain and functional disability in the right knee due to arthritis and has failed non-surgical conservative treatments for greater than 12 weeks to includeNSAID's and/or analgesics, corticosteriod injections, use of assistive devices and activity modification.  Onset of symptoms was gradual, starting 6 years ago with gradually worsening course since that time. The patient noted no past surgery on the right knee(s).  Patient currently rates pain in the right knee(s) at 8 out of 10 with activity. Patient has night pain, worsening of pain with activity and weight bearing, pain that interferes with activities of daily living, pain with passive range of motion, crepitus and joint swelling.  Patient has evidence of subchondral sclerosis and joint space narrowing by imaging studies. This patient has had significant pain with adls. There is no active infection.No history of dvt or pe  Patient Active Problem List   Diagnosis Date Noted  . Burning pain 10/15/2016  . Low back pain 08/01/2016  . Chronic low back pain 07/18/2016  . Prediabetes 05/30/2016  . Cervicalgia 05/30/2016  . Right knee pain 05/30/2016  . Osteopenia 11/18/2015  . Cephalalgia 07/20/2015  . Thrombocytopenia ([place]) 05/30/2015  . Arthritis of left lower extremity 02/02/2015  . Left knee pain 09/08/2014  . Gout 12/11/2013  . Vision disturbance 12/08/2013  . Severe obesity (BMI >= 40) ([place]) 11/06/2013  . Osteoporosis   . Left lumbar radiculopathy 04/13/2012  . Renal cell cancer ([place])   . External hemorrhoid 11/14/2010  . IBS (irritable bowel syndrome) 11/14/2010  . GERD (gastroesophageal reflux disease) 11/14/2010  . HYPERCHOLESTEROLEMIA, MILD 03/16/2010  . Lymphedema 03/15/2010  . ALLERGIC RHINITIS 08/31/2009  . MIGRAINE HEADACHE 06/14/2009   . SINUSITIS, CHRONIC 06/14/2009  . SYNCOPE 05/16/2009  . Essential hypertension 05/06/2009  . CONSTIPATION, CHRONIC 05/06/2009   Past Medical History:  Diagnosis Date  . Allergic rhinitis, cause unspecified   . Anxiety   . Blood dyscrasia    platelets low in past  . Chronic back pain   . Chronic constipation   . Chronic sinusitis   . Depression   . Endometriosis   . ENDOMETRIOSIS 05/06/2009   Qualifier: Diagnosis of  By: Varney Daily RN, Butch Penny    . Esophageal stricture   . GERD (gastroesophageal reflux disease)   . Hemorrhoids   . Hiatal hernia   . Hiatal hernia   . Hyperlipidemia   . Hypertension   . Irritable bowel syndrome   . Osteoarthritis   . Personal history of diabetes mellitus    controlled with diet only  . Renal cell cancer ([place]) 11/2010 dx   R, s/p cryoablation 02/16/11  . Syncopal episodes    since childhood    Past Surgical History:  Procedure Laterality Date  . BREAST SURGERY     bilateral, cyst removal  . fallopian tube removed    . FUNCTIONAL ENDOSCOPIC SINUS SURGERY    . LASER ABLATION OF THE CERVIX    . 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 TUBAL LIGATION      Prescriptions Prior to Admission  Medication Sig Dispense Refill Last Dose  . ACCU-CHEK SOFTCLIX LANCETS lancets by Other route. Use twice a day for blood sugar    10/30/2016 at Unknown time  . alendronate (FOSAMAX) 70 MG tablet Take 1 tablet (70  mg total) by mouth once a week. Take with a full glass of water on an empty stomach. 12 tablet 3 Past Week at Unknown time  . amLODipine (NORVASC) 10 MG tablet Take 1 tablet (10 mg total) by mouth daily. 90 tablet 3 10/30/2016 at 0630  . atorvastatin (LIPITOR) 10 MG tablet TAKE 1 TABLET DAILY 90 tablet 3 10/29/2016 at Unknown time  . calcium-vitamin D (OSCAL WITH D) 500-200 MG-UNIT tablet Take 1 tablet by mouth daily.    10/29/2016 at Unknown time  . Diclofenac Sodium 2 % SOLN Apply 1 pump twice daily. (Patient taking differently:  Apply 1 application topically 2 (two) times daily as needed (PAIN). Apply 1 pump twice daily.) 112 g 1 10/29/2016 at Unknown time  . fluticasone (FLONASE) 50 MCG/ACT nasal spray Place 1 spray into both nostrils daily. 16 g 2 Past Month at Unknown time  . furosemide (LASIX) 20 MG tablet TAKE 1 TABLET DAILY 90 tablet 0 10/29/2016 at Unknown time  . glucose blood (ONETOUCH VERIO) test strip Use one strip to check blood sugar daily 100 each 4 10/30/2016 at Unknown time  . Insulin Pen Needle (NOVOFINE) 32G X 6 MM MISC Use daily with Saxenda 100 each 1 10/30/2016 at Unknown time  . Linaclotide (LINZESS) 145 MCG CAPS capsule Take 1 capsule (145 mcg total) by mouth daily. (Patient taking differently: Take 145 mcg by mouth daily as needed (CONSTIPATION). ) 90 capsule 1 Taking  . Liraglutide -Weight Management (SAXENDA) 18 MG/3ML SOPN Inject 0.6 mg into the skin daily. For one week, then increase 0.6 mg weekly until you reach 3 mg daily 15 pen 1 10/29/2016 at Unknown time  . nystatin (MYCOSTATIN) 100000 UNIT/ML suspension Take 5 mLs by mouth 3 (three) times daily. SWISH AND SWALLOW   Past Week at Unknown time  . ONETOUCH DELICA LANCETS 15V MISC 1 each by Does not apply route daily. Use to help check blood sugars once daily Dx 250.00 100 each 3 10/30/2016 at Unknown time  . pregabalin (LYRICA) 75 MG capsule Take 1 capsule (75 mg total) by mouth 2 (two) times daily. 28 capsule 0 10/29/2016 at Unknown time  . Prenatal Vit-Fe Fumarate-FA (PNV PRENATAL PLUS MULTIVITAMIN) 27-1 MG TABS TAKE 1 TABLET DAILY AT 12 NOON 60 tablet 1 10/29/2016 at Unknown time  . propranolol (INDERAL) 80 MG tablet Take 1 tablet (80 mg total) by mouth 2 (two) times daily. 28 tablet 0 10/30/2016 at 0630  . terbinafine (LAMISIL) 250 MG tablet Take 1 tablet (250 mg total) by mouth daily. 90 tablet 0 10/29/2016 at Unknown time  . valsartan (DIOVAN) 320 MG tablet Take 1 tablet (320 mg total) by mouth daily. 90 tablet 3 10/29/2016 at Unknown time  . zolpidem (AMBIEN)  10 MG tablet Take 10 mg by mouth at bedtime as needed for sleep.   Past Month at Unknown time  . naproxen (NAPROSYN) 250 MG tablet Take 1 tablet (250 mg total) by mouth 2 (two) times daily with a meal. Prn pain 12 tablet 0 Unknown at Unknown time   Allergies  Allergen Reactions  . Sertraline Hcl Other (See Comments)    Spasms; numbness  . Ace Inhibitors Cough  . Codeine Palpitations  . Darifenacin Hydrobromide Er Other (See Comments)    Pt states made her feel queezy & drunk  . Gabapentin Other (See Comments)    Hallucinations  . Pravastatin Other (See Comments)    myalgias  . Propoxyphene Hcl Palpitations  . Wellbutrin [Bupropion Hcl] Other (  See Comments)    Numbness of mouth/lips    Social History  Substance Use Topics  . Smoking status: Never Smoker  . Smokeless tobacco: Never Used  . Alcohol use No     Comment: rare    Family History  Problem Relation Age of Onset  . Diabetes Mother   . Kidney disease Father   . Prostate cancer Father   . Colitis Brother   . Leukemia Brother   . Multiple sclerosis Sister   . Colon polyps    . Colon cancer Neg Hx      Review of Systems  Constitutional: Negative.   Musculoskeletal: Positive for joint pain.  All other systems reviewed and are negative.   Objective:  Physical Exam  Constitutional: She appears well-developed.  HENT:  Head: Normocephalic.  Eyes: Pupils are equal, round, and reactive to light.  Neck: Normal range of motion.  Cardiovascular: Normal rate.   Respiratory: Effort normal.  Neurological: She is alert.  Skin: Skin is warm.  Psychiatric: She has a normal mood and affect.  right knee rom 5 - 115 - skin ok - ext mech ok - collaterals stable - dp 2/4 - df/pf ok5/5  Vital signs in last 24 hours: Pulse Rate:  [48-66] 55 (05/08 1010) Resp:  [9-15] 9 (05/08 1010) BP: (152-162)/(81-96) 162/96 (05/08 1010) SpO2:  [100 %] 100 % (05/08 1010)  Labs:   Estimated body mass index is 40.52 kg/m as calculated  from the following:   Height as of 10/18/16: 5' 1.5" (1.562 m).   Weight as of 10/18/16: 218 lb (98.9 kg).   Imaging Review Plain radiographs demonstrate moderate degenerative joint disease of the right knee(s). The overall alignment ismild varus. The bone quality appears to be good for age and reported activity level.  Assessment/Plan:  End stage arthritis, right knee   The patient history, physical examination, clinical judgment of the provider and imaging studies are consistent with end stage degenerative joint disease of the right knee(s) and total knee arthroplasty is deemed medically necessary. The treatment options including medical management, injection therapy arthroscopy and arthroplasty were discussed at length. The risks and benefits of total knee arthroplasty were presented and reviewed. The risks due to aseptic loosening, infection, stiffness, patella tracking problems, thromboembolic complications and other imponderables were discussed. The patient acknowledged the explanation, agreed to proceed with the plan and consent was signed. Patient is being admitted for inpatient treatment for surgery, pain control, PT, OT, prophylactic antibiotics, VTE prophylaxis, progressive ambulation and ADL's and discharge planning. The patient is planning to be discharged home with home health services Patient is Janet Le witness thus blood conservation measures will be used

## 2016-10-30 NOTE — Brief Op Note (Signed)
10/30/2016  3:33 PM  PATIENT:  [name]  61 y.o. female  PRE-OPERATIVE DIAGNOSIS:  RIGHT KNEE OSTEOARTHRITIS  POST-OPERATIVE DIAGNOSIS:  RIGHT KNEE OSTEOARTHRITIS  PROCEDURE:  Procedure(s): RIGHT TOTAL KNEE ARTHROPLASTY  SURGEON:  Surgeon(s): Marlou Sa, Tonna Corner, MD  ASSISTANT: Laure Kidney RNFA  ANESTHESIA:   general  EBL: 75 ml    Total I/O In: 1100 [I.V.:1100] Out: 50 [Blood:50]  BLOOD ADMINISTERED: none  DRAINS: none   LOCAL MEDICATIONS USED:    SPECIMEN:  No Specimen  COUNTS:  YES  TOURNIQUET:   Total Tourniquet Time Documented: Thigh (Right) - 79 minutes Total: Thigh (Right) - 79 minutes   DICTATION: .Other Dictation: Dictation Number [id number]  PLAN OF CARE: Admit to inpatient   PATIENT DISPOSITION:  PACU - hemodynamically stable

## 2016-10-30 NOTE — Progress Notes (Signed)
Orthopedic Tech Progress Note Patient Details:  Janet Le 1955-10-04 [id number]  CPM Right Knee CPM Right Knee: On Right Knee Flexion (Degrees): 40 Right Knee Extension (Degrees): 10 Additional Comments: applied at 1715   Kristopher Oppenheim 10/30/2016, 5:28 PM

## 2016-10-30 NOTE — Progress Notes (Signed)
Blood refusal form signed, faxed to lab, and placed in chart.

## 2016-10-30 NOTE — Progress Notes (Signed)
Pt stable - pain moderate Dc lyrica - add oral morphine q 4 and iv q 8 as needed Add ultram also

## 2016-10-30 NOTE — Anesthesia Preprocedure Evaluation (Signed)
Anesthesia Evaluation  Patient identified by MRN, date of birth, ID band Patient awake    Reviewed: Allergy & Precautions, NPO status , Patient's Chart, lab work & pertinent test results  Airway Mallampati: III  TM Distance: >3 FB Neck ROM: Full    Dental  (+) Teeth Intact, Dental Advisory Given   Pulmonary    breath sounds clear to auscultation       Cardiovascular hypertension,  Rhythm:Regular Rate:Normal     Neuro/Psych    GI/Hepatic   Endo/Other    Renal/GU      Musculoskeletal   Abdominal (+) + obese,   Peds  Hematology   Anesthesia Other Findings   Reproductive/Obstetrics                             Anesthesia Physical Anesthesia Plan  ASA: III  Anesthesia Plan: General   Post-op Pain Management:    Induction: Intravenous  Airway Management Planned: Oral ETT  Additional Equipment:   Intra-op Plan:   Post-operative Plan: Extubation in OR  Informed Consent: I have reviewed the patients History and Physical, chart, labs and discussed the procedure including the risks, benefits and alternatives for the proposed anesthesia with the patient or authorized representative who has indicated his/her understanding and acceptance.   Dental advisory given  Plan Discussed with: CRNA and Anesthesiologist  Anesthesia Plan Comments:         Anesthesia Quick Evaluation

## 2016-10-30 NOTE — Transfer of Care (Signed)
Immediate Anesthesia Transfer of Care Note  Patient: Janet Le  Procedure(s) Performed: Procedure(s): RIGHT TOTAL KNEE ARTHROPLASTY (Right)  Patient Location: PACU  Anesthesia Type:General  Level of Consciousness: sedated  Airway & Oxygen Therapy: Patient Spontanous Breathing and Patient connected to face mask oxygen  Post-op Assessment: Report given to RN and Post -op Vital signs reviewed and stable  Post vital signs: Reviewed and stable  Last Vitals:  Vitals:   10/30/16 1005 10/30/16 1010  BP:  (!) 162/96  Pulse: (!) 48 (!) 55  Resp: 12 (!) 9   HR 57, RR 12, BP 149/102 Last Pain: There were no vitals filed for this visit.    Patients Stated Pain Goal: 3 (03/52/48 1859)  Complications: No apparent anesthesia complications

## 2016-10-30 NOTE — Anesthesia Procedure Notes (Addendum)
Anesthesia Regional Block: Adductor canal block   Pre-Anesthetic Checklist: ,, timeout performed, Correct Patient, Correct Site, Correct Laterality, Correct Procedure, Correct Position, site marked, Risks and benefits discussed,  Surgical consent,  Pre-op evaluation,  At surgeon's request and post-op pain management  Laterality: Right  Prep: chloraprep       Needles:  Injection technique: Single-shot  Needle Type: Stimulator Needle - 80          Additional Needles:   Procedures: ultrasound guided,,,,,,,,  Narrative:  Start time: 10/30/2016 11:30 AM End time: 10/30/2016 11:35 AM Injection made incrementally with aspirations every 5 mL.  Performed by: Personally  Anesthesiologist: Linna Caprice, Charise Leinbach

## 2016-10-30 NOTE — Anesthesia Postprocedure Evaluation (Addendum)
Anesthesia Post Note  Patient: [name]  Procedure(s) Performed: Procedure(s) (LRB): RIGHT TOTAL KNEE ARTHROPLASTY (Right)  Patient location during evaluation: PACU Anesthesia Type: General and Regional Level of consciousness: awake and alert, awake and oriented Pain management: pain level controlled Vital Signs Assessment: post-procedure vital signs reviewed and stable Respiratory status: spontaneous breathing, nonlabored ventilation and respiratory function stable Cardiovascular status: blood pressure returned to baseline Anesthetic complications: no       Last Vitals:  Vitals:   10/30/16 1816 10/30/16 1825  BP: (!) 149/81 (!) 143/77  Pulse: (!) 49 (!) 55  Resp: 15 15  Temp: 36.8 C     Last Pain:  Vitals:   10/30/16 1800  PainSc: Asleep                 Leyan Branden COKER

## 2016-10-30 NOTE — Progress Notes (Signed)
Patient called to inform nurse that blood sugar is 49 and that she is out of glucose strips at home.  Nurse gave instructions for patient to drink 1/2 cup of apple or cranberry juice, glucose tablets or glucose gel and recheck blood sugar.  Patient stated that she did not have apple or cranberry and that she only had Strawberry Peach juice.  Dr. Conrad Logan informed about situation and gave instructions to drink half cup of apple or cranberry juice.

## 2016-10-30 NOTE — Progress Notes (Signed)
Report to Russ Halo RN as primary caregiver

## 2016-10-31 ENCOUNTER — Encounter (HOSPITAL_COMMUNITY): Payer: Self-pay | Admitting: Orthopedic Surgery

## 2016-10-31 MED ORDER — HYDROMORPHONE HCL 2 MG PO TABS
2.0000 mg | ORAL_TABLET | ORAL | Status: DC | PRN
  Administered 2016-10-31 – 2016-11-03 (×12): 2 mg via ORAL
  Filled 2016-10-31 (×13): qty 1

## 2016-10-31 MED ORDER — HYDROMORPHONE HCL 2 MG PO TABS: 2.0000 mg | ORAL_TABLET | ORAL | 0 refills | Status: DC | PRN

## 2016-10-31 MED ORDER — ZOLPIDEM TARTRATE 5 MG PO TABS: 5.0000 mg | ORAL_TABLET | Freq: Every evening | ORAL | 0 refills | Status: DC | PRN

## 2016-10-31 MED ORDER — METHOCARBAMOL 500 MG PO TABS: 500.0000 mg | ORAL_TABLET | Freq: Four times a day (QID) | ORAL | 0 refills | Status: DC | PRN

## 2016-10-31 MED ORDER — RIVAROXABAN 10 MG PO TABS: 10.0000 mg | ORAL_TABLET | Freq: Every day | ORAL | 0 refills | Status: DC

## 2016-10-31 NOTE — Evaluation (Signed)
Physical Therapy Evaluation Patient Details Name: Janet Le MRN: [id number] DOB: 03-May-1956 Today's Date: 10/31/2016   History of Present Illness  Pt is 61 yo female who presents for R TKA, PMH: renal cell cancer, IBS, OA, DM  Clinical Impression  Pt is s/p TKA resulting in the deficits listed below (see PT Problem List). Pt ambulated 30' with RW and min A.  Pt will benefit from skilled PT to increase their independence and safety with mobility to allow discharge to the venue listed below.      Follow Up Recommendations SNF    Equipment Recommendations  Rolling walker with 5" wheels    Recommendations for Other Services       Precautions / Restrictions Precautions Precautions: Knee Precaution Booklet Issued: Yes (comment) Precaution Comments: reviewed proper positioning and use of zero knee foam and CPM Restrictions Weight Bearing Restrictions: Yes RLE Weight Bearing: Weight bearing as tolerated      Mobility  Bed Mobility Overal bed mobility: Needs Assistance Bed Mobility: Supine to Sit     Supine to sit: Min assist     General bed mobility comments: min A to support RLE, vc's for sequencing  Transfers Overall transfer level: Needs assistance Equipment used: Rolling walker (2 wheeled) Transfers: Sit to/from Stand Sit to Stand: Min assist         General transfer comment: vc's for hand placement, min A to steady  Ambulation/Gait Ambulation/Gait assistance: Min assist Ambulation Distance (Feet): 30 Feet Assistive device: Rolling walker (2 wheeled) Gait Pattern/deviations: Step-through pattern;Decreased stance time - right;Decreased weight shift to right;Antalgic Gait velocity: decreased Gait velocity interpretation: Below normal speed for age/gender General Gait Details: vc's for sequencing  Stairs            Wheelchair Mobility    Modified Rankin (Stroke Patients Only)       Balance Overall balance assessment: Needs  assistance Sitting-balance support: No upper extremity supported Sitting balance-Leahy Scale: Good     Standing balance support: Bilateral upper extremity supported Standing balance-Leahy Scale: Poor Standing balance comment: reliant on RW for UE support                             Pertinent Vitals/Pain Pain Assessment: 0-10 Pain Score: 5  Pain Location: right knee Pain Descriptors / Indicators: Aching Pain Intervention(s): Limited activity within patient's tolerance;Monitored during session    Home Living Family/patient expects to be discharged to:: Private residence Living Arrangements: Children;Other relatives Available Help at Discharge: Family;Available PRN/intermittently Type of Home: House Home Access: Level entry     Home Layout: One level Home Equipment: None Additional Comments: pt alone during the day, also cares for 32 yo daughter    Prior Function Level of Independence: Independent               Hand Dominance        Extremity/Trunk Assessment   Upper Extremity Assessment Upper Extremity Assessment: Defer to OT evaluation    Lower Extremity Assessment Lower Extremity Assessment: RLE deficits/detail RLE Deficits / Details: hip flex 3-/5, knee ext 3-/5, ankle WFL though naturally internally rotated at rest    Cervical / Trunk Assessment Cervical / Trunk Assessment: Normal  Communication   Communication: No difficulties  Cognition Arousal/Alertness: Awake/alert Behavior During Therapy: WFL for tasks assessed/performed Overall Cognitive Status: Within Functional Limits for tasks assessed  General Comments General comments (skin integrity, edema, etc.): VSS throughout    Exercises Total Joint Exercises Ankle Circles/Pumps: AROM;Both;10 reps;Seated Quad Sets: AROM;Both;10 reps;Seated Gluteal Sets: AROM;Both;10 reps;Seated Long Arc Quad: AROM;Right;10 reps;Seated Goniometric  ROM: -5-80   Assessment/Plan    PT Assessment Patient needs continued PT services  PT Problem List Decreased strength;Decreased range of motion;Decreased activity tolerance;Decreased balance;Decreased mobility;Decreased knowledge of use of DME;Decreased knowledge of precautions;Pain       PT Treatment Interventions DME instruction;Gait training;Functional mobility training;Therapeutic activities;Therapeutic exercise;Balance training;Patient/family education    PT Goals (Current goals can be found in the Care Plan section)  Acute Rehab PT Goals Patient Stated Goal: return home after rehab PT Goal Formulation: With patient Time For Goal Achievement: 11/07/16 Potential to Achieve Goals: Good    Frequency 7X/week   Barriers to discharge Decreased caregiver support      Co-evaluation               AM-PAC PT "6 Clicks" Daily Activity  Outcome Measure Difficulty turning over in bed (including adjusting bedclothes, sheets and blankets)?: A Little Difficulty moving from lying on back to sitting on the side of the bed? : Total Difficulty sitting down on and standing up from a chair with arms (e.g., wheelchair, bedside commode, etc,.)?: Total Help needed moving to and from a bed to chair (including a wheelchair)?: A Little Help needed walking in hospital room?: A Little Help needed climbing 3-5 steps with a railing? : A Lot 6 Click Score: 13    End of Session Equipment Utilized During Treatment: Gait belt Activity Tolerance: Patient tolerated treatment well Patient left: in chair;with call bell/phone within reach;with family/visitor present (in zero knee foam) Nurse Communication: Mobility status PT Visit Diagnosis: Pain;Other abnormalities of gait and mobility (R26.89) Pain - Right/Left: Right Pain - part of body: Knee    Time: 1005-1030 PT Time Calculation (min) (ACUTE ONLY): 25 min   Charges:   PT Evaluation $PT Eval Moderate Complexity: 1 Procedure PT  Treatments $Gait Training: 8-22 mins   PT G Codes:        Leighton Roach, PT  Acute Rehab Services  [place] 10/31/2016, 12:12 PM

## 2016-10-31 NOTE — Progress Notes (Signed)
Orthopedic Tech Progress Note Patient Details:  Janet Le 26-Nov-1955 [id number]  Patient ID: [name], female   DOB: 1956-02-16, 61 y.o.   MRN: [id number]   [name] 10/31/2016, 2:08 PM Placed pt's rle on cpm @0 -60 degrees@17400 ; rn NOTIFIED

## 2016-10-31 NOTE — Op Note (Signed)
NAME:  Janet Le, Janet Le                   ACCOUNT NO.:  MEDICAL RECORD NO.:  [id number]  LOCATION:                                 FACILITY:  PHYSICIAN:  Anderson Malta, M.D.    DATE OF BIRTH:  19-May-1956  DATE OF PROCEDURE: DATE OF DISCHARGE:                              OPERATIVE REPORT   PREOPERATIVE DIAGNOSIS:  Right knee arthritis.  POSTOPERATIVE DIAGNOSIS:  Right knee arthritis.  PROCEDURE:  Right total knee replacement using cemented Stryker triathlon posterior cruciate retaining 3 femur, 3 tibia, 13 mm polyethylene insert, cemented 32 mm 3-peg patella.  SURGEON:  Anderson Malta, M.D.  ASSISTANT:  Laure Kidney, RNFA.  INDICATIONS:  Janet Le is a 61 year old patient with right knee pain, failure to conservative management, presents for operative management after explanation of risks and benefits.  PROCEDURE IN DETAIL:  The patient was brought to the operating room where general anesthetic was induced.  Preoperative antibiotics were administered.  Time-out was called.  Preoperative tranexamic acid also administered as this patient is a Jehovah's Witness.  The right knee pre scrubbed with alcohol and Betadine, allowed to air dry, prepped with DuraPrep solution and draped in sterile manner.  Charlie Pitter was used to cover the operative field.  The patient had very significant hyperextension in both knees preoperatively.  Hyperextension measured about 25 degrees bilaterally.  The patient's collateral and cruciate ligaments, however, were stable and intact.  At this time, leg was elevated and exsanguinated with the Esmarch wrap.  Tourniquet was inflated.  Anterior approach to the knee was made after a time-out was called.  A median parapatellar arthrotomy was made and marked with a #1 Vicryl suture. Fat pad partially excised.  Lateral patellofemoral ligament released. Some soft tissue removed off the anterior and distal femur.  Bleeding points encountered, were controlled using  electrocautery.  Minimal soft tissue dissection was performed on the medial aspect of the knee.  At this time, the ACL was released, but the PCL was preserved. Intramedullary alignment was then used to make a 0 degree cut on the tibia, which with the collaterals and posterior neurovascular structures protected.  A 9 mm cut made off the least affected lateral tibial plateau.  At this time, intramedullary alignment was then used to make a cut on the distal femur in 5 degrees of valgus.  An 8 mm cut was made with that a 9, 11, and 13 spacer fit easily with into that created space.  Thirteen gave good stability to varus and valgus stress at 0, 30, and 90 degrees of stress.  The spacer block was removed.  The femur was then cut in the taper and posterior and anterior cuts using a 3 sizer block.  Tibia was keel punched.  Patella was then prepared cutting down from 24 to 12 and replacing with a 32 mm 3-peg patella.  With trial components in place, the 13 mm spacer gave less hyperextension than she had preoperatively, but did allow for excellent range of motion, full flexion without liftoff, and good patellar tracking.  At this time, trial components removed.  Thorough irrigation performed with 3 liters of irrigating solution.  Exparel was  then injected into the capsule. Marcaine, morphine, clonidine injected into the skin.  The tranexamic acid sponge was then allowed to sit for 3 minutes within the operative field.  This was then removed.  Components were then cemented into position with excess cement removed.  Same stability parameters were maintained.  Tourniquet was released.  Bleeding points were encountered and controlled with electrocautery.  Thorough irrigation again performed and the arthrotomy was closed using #1 Vicryl suture followed by interrupted inverted 0 Vicryl suture, 2-0 Vicryl suture, and a running 3-0 Monocryl.  Impervious Aquacel dressing placed along with Ace wrap and knee  immobilizer.  The patient tolerated the procedure well without immediate complication.  Transferred to the recovery room in stable condition.     Anderson Malta, M.D.     GSD/MEDQ  D:  10/30/2016  T:  10/30/2016  Job:  [id number]

## 2016-10-31 NOTE — NC FL2 (Signed)
[place] MEDICAID FL2 LEVEL OF CARE SCREENING TOOL     IDENTIFICATION  Patient Name: Janet Le Birthdate: 12-Nov-1955 Sex: female Admission Date (Current Location): 10/30/2016  [facility] and [place] Number:  Herbalist and Address:  [facility] [place], [place] [address] [place], [place] [address]      Provider Number: [id number]  Attending Physician Name and Address:  Janet Pel, MD  Relative Name and Phone Number:       Current Level of Care: Hospital Recommended Level of Care: [place] Prior Approval Number:    Date Approved/Denied:   PASRR Number:   [id number]  Discharge Plan: SNF    Current Diagnoses: Patient Active Problem List   Diagnosis Date Noted  . Arthritis of knee 10/30/2016  . S/P knee surgery 10/30/2016  . Burning pain 10/15/2016  . Low back pain 08/01/2016  . Chronic low back pain 07/18/2016  . Prediabetes 05/30/2016  . Cervicalgia 05/30/2016  . Right knee pain 05/30/2016  . Osteopenia 11/18/2015  . Cephalalgia 07/20/2015  . Thrombocytopenia ([place]) 05/30/2015  . Arthritis of left lower extremity 02/02/2015  . Left knee pain 09/08/2014  . Gout 12/11/2013  . Vision disturbance 12/08/2013  . Severe obesity (BMI >= 40) ([place]) 11/06/2013  . Osteoporosis   . Left lumbar radiculopathy 04/13/2012  . Renal cell cancer ([place])   . External hemorrhoid 11/14/2010  . IBS (irritable bowel syndrome) 11/14/2010  . GERD (gastroesophageal reflux disease) 11/14/2010  . HYPERCHOLESTEROLEMIA, MILD 03/16/2010  . Lymphedema 03/15/2010  . ALLERGIC RHINITIS 08/31/2009  . MIGRAINE HEADACHE 06/14/2009  . SINUSITIS, CHRONIC 06/14/2009  . SYNCOPE 05/16/2009  . Essential hypertension 05/06/2009  . CONSTIPATION, CHRONIC 05/06/2009    Orientation RESPIRATION BLADDER Height & Weight     Self, Time, Situation, Place  Normal Continent Weight:   Height:     BEHAVIORAL SYMPTOMS/MOOD NEUROLOGICAL BOWEL NUTRITION STATUS       Continent Diet (regular)  AMBULATORY STATUS COMMUNICATION OF NEEDS Skin   Supervision Verbally Surgical wounds, Skin abrasions, Bruising                       Personal Care Assistance Level of Assistance  Bathing, Feeding, Dressing Bathing Assistance: Limited assistance Feeding assistance: Independent Dressing Assistance: Limited assistance     Functional Limitations Info  Sight, Hearing, Speech Sight Info: Adequate Hearing Info: Adequate Speech Info: Adequate    SPECIAL CARE FACTORS FREQUENCY  PT (By licensed PT), OT (By licensed OT)     PT Frequency: 5x OT Frequency: 5x            Contractures Contractures Info: Not present    Additional Factors Info  Code Status, Allergies Code Status Info: Full Code Allergies Info: Sertraline Hcl, Ace Inhibitors, Codeine, Darifenacin Hydrobromide Er, Gabapentin, Pravastatin, Propoxyphene Hcl, Wellbutrin Bupropion Hcl           Current Medications (10/31/2016):  This is the current hospital active medication list Current Facility-Administered Medications  Medication Dose Route Frequency Provider Last Rate Last Dose  . acetaminophen (TYLENOL) tablet 650 mg  650 mg Oral Q6H PRN Janet Pel, MD   650 mg at 10/31/16 0654   Or  . acetaminophen (TYLENOL) suppository 650 mg  650 mg Rectal Q6H PRN Janet Pel, MD      . amLODipine (NORVASC) tablet 10 mg  10 mg Oral Daily Janet Pel, MD   10 mg at 10/31/16 0947  . atorvastatin (LIPITOR)  tablet 10 mg  10 mg Oral Daily Janet Pel, MD   10 mg at 10/31/16 0947  . calcium-vitamin D (OSCAL WITH D) 500-200 MG-UNIT per tablet 1 tablet  1 tablet Oral Daily Janet Pel, MD   1 tablet at 10/31/16 [phone]  . docusate sodium (COLACE) capsule 100 mg  100 mg Oral BID Janet Pel, MD   100 mg at 10/31/16 0946  . fluticasone (FLONASE) 50 MCG/ACT nasal spray 1 spray  1 spray Each Nare Daily Janet Pel, MD      . furosemide (LASIX) tablet 20  mg  20 mg Oral Daily Janet Pel, MD   20 mg at 10/31/16 0946  . HYDROmorphone (DILAUDID) tablet 2 mg  2 mg Oral Q3H PRN Janet Pel, MD   2 mg at 10/31/16 1010  . irbesartan (AVAPRO) tablet 37.5 mg  37.5 mg Oral Daily Janet Pel, MD   37.5 mg at 10/31/16 0949  . linaclotide (LINZESS) capsule 145 mcg  145 mcg Oral Daily PRN Janet Pel, MD      . menthol-cetylpyridinium (CEPACOL) lozenge 3 mg  1 lozenge Oral PRN Janet Pel, MD       Or  . phenol (CHLORASEPTIC) mouth spray 1 spray  1 spray Mouth/Throat PRN Janet Pel, MD      . methocarbamol (ROBAXIN) tablet 500 mg  500 mg Oral Q6H PRN Janet Pel, MD   500 mg at 10/31/16 0247   Or  . methocarbamol (ROBAXIN) 500 mg in dextrose 5 % 50 mL IVPB  500 mg Intravenous Q6H PRN Janet Pel, MD      . metoCLOPramide (REGLAN) tablet 5-10 mg  5-10 mg Oral Q8H PRN Janet Pel, MD   10 mg at 10/31/16 0033   Or  . metoCLOPramide (REGLAN) injection 5-10 mg  5-10 mg Intravenous Q8H PRN Janet Pel, MD      . morphine 4 MG/ML injection 4 mg  4 mg Intravenous Q8H PRN Janet Pel, MD   4 mg at 10/31/16 0331  . nystatin (MYCOSTATIN) 100000 UNIT/ML suspension 500,000 Units  5 mL Oral TID Janet Pel, MD   500,000 Units at 10/31/16 [phone]  . ondansetron (ZOFRAN) tablet 4 mg  4 mg Oral Q6H PRN Janet Pel, MD   4 mg at 10/30/16 2121   Or  . ondansetron (ZOFRAN) injection 4 mg  4 mg Intravenous Q6H PRN Janet Pel, MD      . propranolol (INDERAL) tablet 80 mg  80 mg Oral BID Janet Pel, MD   80 mg at 10/31/16 1224  . rivaroxaban (XARELTO) tablet 10 mg  10 mg Oral Q breakfast Janet Pel, MD   10 mg at 10/31/16 0800  . traMADol (ULTRAM) tablet 50 mg  50 mg Oral Q6H Janet Pel, MD   50 mg at 10/31/16 1300  . zolpidem (AMBIEN) tablet 5 mg  5 mg Oral QHS PRN Janet Pel, MD         Discharge Medications: Please see discharge  summary for a list of discharge medications.  Relevant Imaging Results:  Relevant Lab Results:   Additional Information SSN:  [id number]  Janet Le, [place]

## 2016-10-31 NOTE — Progress Notes (Signed)
Physical Therapy Treatment Patient Details Name: Janet Le MRN: [id number] DOB: [birth year] Today's Date: 10/31/2016    History of Present Illness Pt is 61 yo female who presents for R TKA, PMH: renal cell cancer, IBS, OA, DM    PT Comments    Pt uncomfortable in CPM, leg out, ambulated 76' with RW and min-guard A as well as practicing transfers and ther ex and returned to CPM with increased comfort after minor adjustments to positioning. PT will continue to follow.     Follow Up Recommendations  SNF     Equipment Recommendations  Rolling walker with 5" wheels    Recommendations for Other Services       Precautions / Restrictions Precautions Precautions: Knee Precaution Booklet Issued: No Precaution Comments: repositioned pt in CPM for increased comfort and education given on proper alignment Restrictions Weight Bearing Restrictions: Yes RLE Weight Bearing: Weight bearing as tolerated    Mobility  Bed Mobility Overal bed mobility: Needs Assistance Bed Mobility: Supine to Sit;Sit to Supine     Supine to sit: Supervision Sit to supine: Min assist   General bed mobility comments: pt able to use hands to assist RLE and bring to EOB but min A needed for return to bed against gravity  Transfers Overall transfer level: Needs assistance Equipment used: Rolling walker (2 wheeled) Transfers: Sit to/from Stand Sit to Stand: Min guard         General transfer comment: reminder to reach back for surface (BSC) before sitting  Ambulation/Gait Ambulation/Gait assistance: Min guard Ambulation Distance (Feet): 75 Feet Assistive device: Rolling walker (2 wheeled) Gait Pattern/deviations: Step-through pattern;Decreased stance time - right;Decreased weight shift to right;Antalgic Gait velocity: decreased Gait velocity interpretation: Below normal speed for age/gender General Gait Details: vc's for erect posture   Stairs            Wheelchair Mobility     Modified Rankin (Stroke Patients Only)       Balance Overall balance assessment: Needs assistance Sitting-balance support: No upper extremity supported Sitting balance-Leahy Scale: Good     Standing balance support: No upper extremity supported Standing balance-Leahy Scale: Fair Standing balance comment: able to stand at sink to wash hands without UE support                            Cognition Arousal/Alertness: Awake/alert Behavior During Therapy: WFL for tasks assessed/performed Overall Cognitive Status: Within Functional Limits for tasks assessed                                        Exercises Total Joint Exercises Ankle Circles/Pumps: AROM;Both;10 reps;Supine Quad Sets: AROM;Both;10 reps;Seated Gluteal Sets: AROM;Both;10 reps;Seated Long Arc Quad: AROM;Right;10 reps;Seated Goniometric ROM: -5-80    General Comments General comments (skin integrity, edema, etc.): Blister noted on the back of calf muscle      Pertinent Vitals/Pain Pain Assessment: Faces Pain Score: 3  Faces Pain Scale: Hurts little more Pain Location: right knee and medial calf Pain Descriptors / Indicators: Aching Pain Intervention(s): Limited activity within patient's tolerance;Monitored during session    Home Living Family/patient expects to be discharged to:: Skilled nursing facility Living Arrangements: Children;Other relatives Available Help at Discharge: Family;Available PRN/intermittently Type of Home: House Home Access: Level entry   Home Layout: One level Home Equipment: None Additional Comments: pt alone during the day, also  cares for 52 yo granddaughter    Prior Function Level of Independence: Independent          PT Goals (current goals can now be found in the care plan section) Acute Rehab PT Goals Patient Stated Goal: return home after rehab PT Goal Formulation: With patient Time For Goal Achievement: 11/07/16 Potential to Achieve Goals:  Good Progress towards PT goals: Progressing toward goals    Frequency    7X/week      PT Plan Current plan remains appropriate    Co-evaluation              AM-PAC PT "6 Clicks" Daily Activity  Outcome Measure  Difficulty turning over in bed (including adjusting bedclothes, sheets and blankets)?: A Little Difficulty moving from lying on back to sitting on the side of the bed? : Total Difficulty sitting down on and standing up from a chair with arms (e.g., wheelchair, bedside commode, etc,.)?: Total Help needed moving to and from a bed to chair (including a wheelchair)?: A Little Help needed walking in hospital room?: A Little Help needed climbing 3-5 steps with a railing? : A Lot 6 Click Score: 13    End of Session Equipment Utilized During Treatment: Gait belt Activity Tolerance: Patient tolerated treatment well Patient left: with call bell/phone within reach;in CPM;in bed;with nursing/sitter in room Nurse Communication: Mobility status PT Visit Diagnosis: Pain;Other abnormalities of gait and mobility (R26.89) Pain - Right/Left: Right Pain - part of body: Knee     Time: 6579-0383 PT Time Calculation (min) (ACUTE ONLY): 32 min  Charges:  $Gait Training: 23-37 mins                    G Codes:       Leighton Roach, PT  Acute Rehab Services  [place] 10/31/2016, 3:54 PM

## 2016-10-31 NOTE — Progress Notes (Signed)
Orthopedic Tech Progress Note Patient Details:  LODIE WAHEED 04-27-1956 [id number] On cpm at 1920 Patient ID: Janet Le, female   DOB: 07-28-1955, 61 y.o.   MRN: [id number]   [name] 10/31/2016, 7:19 PM

## 2016-10-31 NOTE — Discharge Summary (Addendum)
Physician Discharge Summary  Patient ID: ZAYNAH CHAWLA MRN: [id number] DOB/AGE: 03/14/1956 61 y.o.  Admit date: 10/30/2016 Discharge date: 11/02/2016  Admission Diagnoses:  Active Problems:   Arthritis of knee   S/P knee surgery   Discharge Diagnoses:  Same  Surgeries: Procedure(s): RIGHT TOTAL KNEE ARTHROPLASTY on 10/30/2016   Consultants:   Discharged Condition: Stable  Hospital Course: Janet Le is an 61 y.o. female who was admitted 10/30/2016 with a chief complaint of right knee pain, and found to have a diagnosis of right knee arthritis.  They were brought to the operating room on 10/30/2016 and underwent the above named procedures.She did well with PT and was ambulating in hall at time of dc. - Will go to snf for rehab. On dilaudid for pain and xarelto for dvt prophylaxis    Antibiotics given:  Anti-infectives    Start     Dose/Rate Route Frequency Ordered Stop   10/30/16 1830  ceFAZolin (ANCEF) IVPB 2g/100 mL premix     2 g 200 mL/hr over 30 Minutes Intravenous Every 6 hours 10/30/16 1821 10/31/16 0056   10/30/16 1100  ceFAZolin (ANCEF) IVPB 2g/100 mL premix     2 g 200 mL/hr over 30 Minutes Intravenous On call to O.R. 10/30/16 9381 10/30/16 1250    .  Recent vital signs:  Vitals:   10/30/16 2058 10/31/16 0442  BP: (!) 147/76 124/69  Pulse: (!) 55 64  Resp: 16 18  Temp:  97.5 F (36.4 C)    Recent laboratory studies:  Results for orders placed or performed during the hospital encounter of 01/75/10  Basic metabolic panel  Result Value Ref Range   Sodium 140 135 - 145 mmol/L   Potassium 3.9 3.5 - 5.1 mmol/L   Chloride 109 101 - 111 mmol/L   CO2 26 22 - 32 mmol/L   Glucose, Bld 82 65 - 99 mg/dL   BUN 15 6 - 20 mg/dL   Creatinine, Ser 0.83 0.44 - 1.00 mg/dL   Calcium 9.8 8.9 - 10.3 mg/dL   GFR calc non Af Amer >60 >60 mL/min   GFR calc Af Amer >60 >60 mL/min   Anion gap 5 5 - 15  Glucose, capillary  Result Value Ref Range   Glucose-Capillary 86 65 -  99 mg/dL  Glucose, capillary  Result Value Ref Range   Glucose-Capillary 79 65 - 99 mg/dL  Glucose, capillary  Result Value Ref Range   Glucose-Capillary 83 65 - 99 mg/dL  Glucose, capillary  Result Value Ref Range   Glucose-Capillary 77 65 - 99 mg/dL   Comment 1 Notify RN    Comment 2 Document in Chart   Glucose, capillary  Result Value Ref Range   Glucose-Capillary 144 (H) 65 - 99 mg/dL   Comment 1 Notify RN    Comment 2 Document in Chart   Glucose, capillary  Result Value Ref Range   Glucose-Capillary 113 (H) 65 - 99 mg/dL  Glucose, capillary  Result Value Ref Range   Glucose-Capillary 121 (H) 65 - 99 mg/dL   Comment 1 Notify RN   Glucose, capillary  Result Value Ref Range   Glucose-Capillary 119 (H) 65 - 99 mg/dL   Comment 1 Notify RN   No blood products  Result Value Ref Range   Transfuse no blood products TRANSFUSE NO BLOOD PRODUCTS, VERIFIED BY Barnes RN     Discharge Medications:   Allergies as of 10/31/2016      Reactions   Sertraline Hcl  Other (See Comments)   Spasms; numbness   Ace Inhibitors Cough   Codeine Palpitations   Darifenacin Hydrobromide Er Other (See Comments)   Pt states made her feel queezy & drunk   Gabapentin Other (See Comments)   Hallucinations   Pravastatin Other (See Comments)   myalgias   Propoxyphene Hcl Palpitations   Wellbutrin [bupropion Hcl] Other (See Comments)   Numbness of mouth/lips      Medication List    STOP taking these medications   naproxen 250 MG tablet Commonly known as:  NAPROSYN   pregabalin 75 MG capsule Commonly known as:  LYRICA   terbinafine 250 MG tablet Commonly known as:  LAMISIL     TAKE these medications   ACCU-CHEK SOFTCLIX LANCETS lancets by Other route. Use twice a day for blood sugar   ONETOUCH DELICA LANCETS 77L Misc 1 each by Does not apply route daily. Use to help check blood sugars once daily Dx 250.00   alendronate 70 MG tablet Commonly known as:  FOSAMAX Take 1 tablet (70 mg  total) by mouth once a week. Take with a full glass of water on an empty stomach.   amLODipine 10 MG tablet Commonly known as:  NORVASC Take 1 tablet (10 mg total) by mouth daily.   atorvastatin 10 MG tablet Commonly known as:  LIPITOR TAKE 1 TABLET DAILY   calcium-vitamin D 500-200 MG-UNIT tablet Commonly known as:  OSCAL WITH D Take 1 tablet by mouth daily.   Diclofenac Sodium 2 % Soln Apply 1 pump twice daily. What changed:  how much to take  how to take this  when to take this  reasons to take this  additional instructions   fluticasone 50 MCG/ACT nasal spray Commonly known as:  FLONASE Place 1 spray into both nostrils daily.   furosemide 20 MG tablet Commonly known as:  LASIX TAKE 1 TABLET DAILY   glucose blood test strip Commonly known as:  ONETOUCH VERIO Use one strip to check blood sugar daily   HYDROmorphone 2 MG tablet Commonly known as:  DILAUDID Take 1 tablet (2 mg total) by mouth every 4 (four) hours as needed for severe pain.   Insulin Pen Needle 32G X 6 MM Misc Commonly known as:  NOVOFINE Use daily with Saxenda   linaclotide 145 MCG Caps capsule Commonly known as:  LINZESS Take 1 capsule (145 mcg total) by mouth daily. What changed:  when to take this  reasons to take this   Liraglutide -Weight Management 18 MG/3ML Sopn Commonly known as:  SAXENDA Inject 0.6 mg into the skin daily. For one week, then increase 0.6 mg weekly until you reach 3 mg daily   methocarbamol 500 MG tablet Commonly known as:  ROBAXIN Take 1 tablet (500 mg total) by mouth every 6 (six) hours as needed for muscle spasms.   nystatin 100000 UNIT/ML suspension Commonly known as:  MYCOSTATIN Take 5 mLs by mouth 3 (three) times daily. SWISH AND SWALLOW   PNV PRENATAL PLUS MULTIVITAMIN 27-1 MG Tabs TAKE 1 TABLET DAILY AT 12 NOON   propranolol 80 MG tablet Commonly known as:  INDERAL Take 1 tablet (80 mg total) by mouth 2 (two) times daily.   rivaroxaban 10 MG  Tabs tablet Commonly known as:  XARELTO Take 1 tablet (10 mg total) by mouth daily with breakfast. Start taking on:  11/01/2016   valsartan 320 MG tablet Commonly known as:  DIOVAN Take 1 tablet (320 mg total) by mouth daily.  zolpidem 5 MG tablet Commonly known as:  AMBIEN Take 1 tablet (5 mg total) by mouth at bedtime as needed for sleep. What changed:  medication strength  how much to take       Diagnostic Studies: Dg Chest 2 View  Result Date: 10/18/2016 CLINICAL DATA:  Knee surgery. Preoperative exam. History of hypertension. EXAM: CHEST  2 VIEW COMPARISON:  CT 11/13/2013. FINDINGS: Mediastinum and hilar structures are normal. Cardiomegaly with normal pulmonary vascularity. No focal infiltrate. No pleural effusion or pneumothorax. No acute bony abnormality . IMPRESSION: 1. Cardiomegaly.  No pulmonary venous congestion. 2. No acute pulmonary disease . Electronically Signed   By: Marcello Moores  Register   On: 10/18/2016 13:37    Disposition: 01-Home or Self Care  Discharge Instructions    Call MD / Call 911    Complete by:  As directed    If you experience chest pain or shortness of breath, CALL 911 and be transported to the hospital emergency room.  If you develope a fever above 101 F, pus (white drainage) or increased drainage or redness at the wound, or calf pain, call your surgeon's office.   Constipation Prevention    Complete by:  As directed    Drink plenty of fluids.  Prune juice may be helpful.  You may use a stool softener, such as Colace (over the counter) 100 mg twice a day.  Use MiraLax (over the counter) for constipation as needed.   Diet - low sodium heart healthy    Complete by:  As directed    Discharge instructions    Complete by:  As directed    Weight bearing as tolerated in knee immobilizer Remove dressing in 10 days cpm daily   Increase activity slowly as tolerated    Complete by:  As directed          Signed: Anderson Malta 10/31/2016, 12:51  PM

## 2016-10-31 NOTE — Progress Notes (Signed)
Subjective: Oral mso4 not working will try dilaudid   Objective: Vital signs in last 24 hours: Temp:  [97.2 F (36.2 C)-98.2 F (36.8 C)] 97.5 F (36.4 C) (05/09 0442) Pulse Rate:  [48-66] 64 (05/09 0442) Resp:  [9-18] 18 (05/09 0442) BP: (95-162)/(64-96) 124/69 (05/09 0442) SpO2:  [97 %-100 %] 98 % (05/09 0442)  Intake/Output from previous day: 05/08 0701 - 05/09 0700 In: 1400 [I.V.:1400] Out: 650 [Urine:600; Blood:50] Intake/Output this shift: No intake/output data recorded.  Exam:  Dorsiflexion/Plantar flexion intact  Labs: No results for input(s): HGB in the last 72 hours. No results for input(s): WBC, RBC, HCT, PLT in the last 72 hours.  Recent Labs  10/30/16 0853  NA 140  K 3.9  CL 109  CO2 26  BUN 15  CREATININE 0.83  GLUCOSE 82  CALCIUM 9.8   No results for input(s): LABPT, INR in the last 72 hours.  Assessment/Plan: Plan sw consult for snf placement tomorrow - pt and cpm today   G Alphonzo Severance 10/31/2016, 8:01 AM

## 2016-10-31 NOTE — Clinical Social Work Note (Signed)
Clinical Social Work Assessment  Patient Details  Name: Janet Le MRN: [id number] Date of Birth: Jun 14, 1956  Date of referral:  10/31/16               Reason for consult:  Facility Placement                Permission sought to share information with:  Case Manager, Customer service manager, Family Supports Permission granted to share information::  Yes, Verbal Permission Granted  Name::        Agency::     Relationship::  none at this time  Contact Information:     Housing/Transportation Living arrangements for the past 2 months:  Single Family Home Source of Information:  Patient, Medical Team, Case Manager, Facility Patient Interpreter Needed:  None Criminal Activity/Legal Involvement Pertinent to Current Situation/Hospitalization:  No - Comment as needed Significant Relationships:  Other Family Members Lives with:  Self Do you feel safe going back to the place where you live?  No Need for family participation in patient care:  No (Coment)  Care giving concerns:  Patient reports she lives at home with her 61 year old granddaughter. Reports she is separated from her husband and has not been working since Dec 2017.  Reports she is wanting rehab for short term placement as she does not have consistent supervision at home and other family is unavailable.  Patient able to ambulate with walker and limited by pain.  She is independent will all other ADLS.   Social Worker assessment / plan:  Assessment completed and SNF work up completed. Patient voices that she is worried about paying her hospital bill due to no income coming in as she has been out of work. Patient reports she has filed for disability and in the beginning stages of filing and yet to find out if she has been deemed disabled. She is currently on her husbands insurance. She is educated about the financial program with billing and encouraged to call once she receives her bill. She is updated that the same  applies for SNF placement as insurance may authorize but there will be a copay if required. She is also updated about limited bed availability due to contracts with [employer] and open to referrals along with follow up for bed. Her first choice is [facility]  Employment status:  Unemployed Forensic scientist:  Managed Care PT Recommendations:  [place] / Referral to community resources:  [place]  Patient/Family's Response to care:  Understanding  Patient/Family's Understanding of and Emotional Response to Diagnosis, Current Treatment, and Prognosis:  Aware of needs and understanding of placement along with financial burdens of treatment causing some anxiety.  Emotional Assessment Appearance:  Appears stated age Attitude/Demeanor/Rapport:    Affect (typically observed):  Accepting, Adaptable Orientation:  Oriented to Self, Oriented to Place, Oriented to  Time, Oriented to Situation Alcohol / Substance use:  Not Applicable Psych involvement (Current and /or in the community):  No (Comment)  Discharge Needs  Concerns to be addressed:  No discharge needs identified Readmission within the last 30 days:  No Current discharge risk:  None Barriers to Discharge:  No Barriers Identified   [name], LCSW 10/31/2016, 2:10 PM

## 2016-10-31 NOTE — Evaluation (Signed)
Occupational Therapy Evaluation Patient Details Name: Janet Le MRN: [id number] DOB: 08-01-1955 Today's Date: 10/31/2016    History of Present Illness Pt is 61 yo female who presents for R TKA, PMH: renal cell cancer, IBS, OA, DM   Clinical Impression   PTA Pt independent in ADL/IADL and mobility. Pt currently mod A for ADL and min A for mobility with RW. Please see OT problem list below. Pt educated on transfer to [facility], peri care, and LB dressing/bathing with AE. Pt will benefit from skilled OT in the acute setting to maximize safety and independence in ADL and functional transfers and will require SNF level therapy to return to PLOF. Next session to focus on practice with AE for LB bathing/dressing.    Follow Up Recommendations  SNF    Equipment Recommendations  Other (comment) (defer to next venue)    Recommendations for Other Services       Precautions / Restrictions Precautions Precautions: Knee Precaution Booklet Issued: Yes (comment) Precaution Comments: reviewed proper positioning and use of zero knee foam and CPM Restrictions Weight Bearing Restrictions: Yes RLE Weight Bearing: Weight bearing as tolerated      Mobility Bed Mobility Overal bed mobility: Needs Assistance Bed Mobility: Supine to Sit     Supine to sit: Min guard     General bed mobility comments: Pt used rails to assist coming to EOB and HOB elevated, no physical assist needed  Transfers Overall transfer level: Needs assistance Equipment used: Rolling walker (2 wheeled) Transfers: Sit to/from Stand Sit to Stand: Min assist         General transfer comment: vc's for hand placement, min A to steady    Balance Overall balance assessment: Needs assistance Sitting-balance support: No upper extremity supported Sitting balance-Leahy Scale: Good     Standing balance support: Bilateral upper extremity supported Standing balance-Leahy Scale: Poor Standing balance comment: reliant on RW for  UE support                           ADL either performed or assessed with clinical judgement   ADL Overall ADL's : Needs assistance/impaired Eating/Feeding: Modified independent;Sitting   Grooming: Wash/dry hands;Wash/dry face;Set up;Sitting Grooming Details (indicate cue type and reason): in recliner and on BSC Upper Body Bathing: Set up;Sitting   Lower Body Bathing: Moderate assistance;Sitting/lateral leans   Upper Body Dressing : Set up;Sitting   Lower Body Dressing: Moderate assistance;Sitting/lateral leans   Toilet Transfer: Min Geophysical data processor Details (indicate cue type and reason): vc for safe hand placement Toileting- Clothing Manipulation and Hygiene: Minimal assistance;Sit to/from stand Toileting - Clothing Manipulation Details (indicate cue type and reason): Pt required min A for managing hospital gown, able to perform peri care in standing     Functional mobility during ADLs: Min guard;Cueing for safety;Rolling walker       Vision Patient Visual Report: No change from baseline Vision Assessment?: No apparent visual deficits     Perception     Praxis      Pertinent Vitals/Pain Pain Assessment: 0-10 Pain Score: 3  Pain Location: right knee Pain Descriptors / Indicators: Aching Pain Intervention(s): Monitored during session;Limited activity within patient's tolerance;Repositioned;Premedicated before session;Ice applied     Hand Dominance Right   Extremity/Trunk Assessment Upper Extremity Assessment Upper Extremity Assessment: Overall WFL for tasks assessed   Lower Extremity Assessment Lower Extremity Assessment: RLE deficits/detail;Defer to PT evaluation RLE Deficits / Details: s/p TKA   Cervical / Trunk  Assessment Cervical / Trunk Assessment: Normal   Communication Communication Communication: No difficulties   Cognition Arousal/Alertness: Awake/alert Behavior During Therapy: WFL for tasks  assessed/performed Overall Cognitive Status: Within Functional Limits for tasks assessed                                     General Comments  Blister noted on the back of calf muscle    Exercises Exercises: Total Joint Total Joint Exercises Ankle Circles/Pumps: AROM;Both;10 reps;Seated Quad Sets: AROM;Both;10 reps;Seated Gluteal Sets: AROM;Both;10 reps;Seated Long Arc Quad: AROM;Right;10 reps;Seated Goniometric ROM: -5-80   Shoulder Instructions      Home Living Family/patient expects to be discharged to:: [place]: Children;Other relatives Available Help at Discharge: Family;Available PRN/intermittently Type of Home: House Home Access: Level entry     Home Layout: One level     Bathroom Shower/Tub: Tub/shower unit;Curtain   Biochemist, clinical: Standard Bathroom Accessibility: Yes How Accessible: Accessible via walker Home Equipment: None   Additional Comments: pt alone during the day, also cares for 58 yo granddaughter      Prior Functioning/Environment Level of Independence: Independent                 OT Problem List: Decreased range of motion;Impaired balance (sitting and/or standing);Decreased activity tolerance;Decreased safety awareness;Decreased knowledge of use of DME or AE;Obesity;Pain      OT Treatment/Interventions: Self-care/ADL training;DME and/or AE instruction;Therapeutic activities;Patient/family education;Balance training    OT Goals(Current goals can be found in the care plan section) Acute Rehab OT Goals Patient Stated Goal: return home after rehab OT Goal Formulation: With patient Time For Goal Achievement: 11/14/16 Potential to Achieve Goals: Good ADL Goals Pt Will Perform Lower Body Bathing: with supervision;with adaptive equipment;sitting/lateral leans Pt Will Perform Lower Body Dressing: with supervision;sit to/from stand Pt Will Transfer to Toilet: with modified  independence;ambulating (with RW) Pt Will Perform Toileting - Clothing Manipulation and hygiene: with modified independence;sit to/from stand  OT Frequency: Min 2X/week   Barriers to D/C: Decreased caregiver support  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       Co-evaluation              AM-PAC PT "6 Clicks" Daily Activity     Outcome Measure Help from another person eating meals?: None Help from another person taking care of personal grooming?: A Little Help from another person toileting, which includes using toliet, bedpan, or urinal?: A Little Help from another person bathing (including washing, rinsing, drying)?: A Lot Help from another person to put on and taking off regular upper body clothing?: None Help from another person to put on and taking off regular lower body clothing?: A Lot 6 Click Score: 18   End of Session Equipment Utilized During Treatment: Gait belt;Rolling walker CPM Right Knee CPM Right Knee: Off Nurse Communication: Mobility status;Weight bearing status  Activity Tolerance: Patient tolerated treatment well Patient left: in chair;with call bell/phone within reach;Other (comment) (MD present)  OT Visit Diagnosis: Other abnormalities of gait and mobility (R26.89);Pain Pain - Right/Left: Right Pain - part of body: Knee                Time: 8144-8185 OT Time Calculation (min): 28 min Charges:  OT General Charges $OT Visit: 1 Procedure OT Evaluation $OT Eval Moderate Complexity: 1 Procedure OT Treatments $Self Care/Home Management : 8-22 mins G-Codes:  Hulda Humphrey OTR/L [place] 10/31/2016, 1:44 PM

## 2016-10-31 NOTE — Progress Notes (Signed)
Plan snf tomorrow rx on chart with dc summary

## 2016-11-01 MED ORDER — ATORVASTATIN CALCIUM 10 MG PO TABS
10.0000 mg | ORAL_TABLET | Freq: Every day | ORAL | Status: DC
  Administered 2016-11-02: 10 mg via ORAL
  Filled 2016-11-01: qty 1

## 2016-11-01 NOTE — Progress Notes (Signed)
Subjective: c/o right knee pain.  Will need snf placement for rehab.  No assistance at home.     Objective: Vital signs in last 24 hours: Temp:  [97.5 F (36.4 C)-99.6 F (37.6 C)] 99.6 F (37.6 C) (05/10 0410) Pulse Rate:  [59-88] 88 (05/10 0410) Resp:  [16-18] 16 (05/10 0410) BP: (105-129)/(67-73) 118/69 (05/10 0410) SpO2:  [95 %-99 %] 95 % (05/10 0410)  Intake/Output from previous day: 05/09 0701 - 05/10 0700 In: 480 [P.O.:480] Out: 700 [Urine:700] Intake/Output this shift: No intake/output data recorded.  No results for input(s): HGB in the last 72 hours. No results for input(s): WBC, RBC, HCT, PLT in the last 72 hours.  Recent Labs  10/30/16 0853  NA 140  K 3.9  CL 109  CO2 26  BUN 15  CREATININE 0.83  GLUCOSE 82  CALCIUM 9.8   No results for input(s): LABPT, INR in the last 72 hours.  Exam; Pleasant bf alert and oriented, NAD.  aquacel dressing intact.  Knee looks good.  Some swelling.  Calf nontender.  NVI.   Assessment/Plan: Awaiting SNF placement.  Discussed [facility] with patient.  Agrees to going there if they accept her insurance.  Continue present care.     Janet Le 11/01/2016, 9:24 AM

## 2016-11-01 NOTE — Progress Notes (Signed)
Physical Therapy Treatment Patient Details Name: Janet Le MRN: [id number] DOB: March 23, 1956 Today's Date: 11/01/2016    History of Present Illness Pt is 61 yo female who presents for R TKA, PMH: renal cell cancer, IBS, OA, DM    PT Comments     Pt was initially upset on arrival as she had been told she was d/cing today and it caught her off guard. Expected to d/c tonight or tomorrow morning. Instructed pt on seated HEP and ambulated in hallway for 200 ft. Min guard for gait. Pt required two standing rest breaks due to UE fatigue. Patient would benefit from continued skilled PT. Will continue to follow acutely.     Follow Up Recommendations  SNF     Equipment Recommendations  Rolling walker with 5" wheels    Recommendations for Other Services       Precautions / Restrictions Precautions Precautions: Knee Precaution Booklet Issued: No Restrictions Weight Bearing Restrictions: Yes RLE Weight Bearing: Weight bearing as tolerated    Mobility  Bed Mobility Overal bed mobility: Needs Assistance Bed Mobility: Sit to Supine       Sit to supine: Supervision   General bed mobility comments: Pt able to use sheet to lift RLE onto bed  Transfers Overall transfer level: Needs assistance Equipment used: Rolling walker (2 wheeled) Transfers: Sit to/from Stand Sit to Stand: Min guard            Ambulation/Gait Ambulation/Gait assistance: Min guard Ambulation Distance (Feet): 200 Feet Assistive device: Rolling walker (2 wheeled) Gait Pattern/deviations: Step-through pattern;Decreased stance time - right;Decreased weight shift to right;Antalgic Gait velocity: decreased   General Gait Details: Pt required 2 standing rest breaks during ambulation. Slightly antalgic slow gait but overall steady with RW.   Stairs            Wheelchair Mobility    Modified Rankin (Stroke Patients Only)       Balance Overall balance assessment: Needs  assistance Sitting-balance support: No upper extremity supported Sitting balance-Leahy Scale: Good     Standing balance support: No upper extremity supported Standing balance-Leahy Scale: Fair Standing balance comment: can static stand with out UE support                            Cognition Arousal/Alertness: Awake/alert Behavior During Therapy: WFL for tasks assessed/performed Overall Cognitive Status: Within Functional Limits for tasks assessed                                        Exercises Total Joint Exercises Heel Slides: AROM;Right;10 reps;Seated Long Arc Quad: AROM;Right;10 reps;Seated Knee Flexion: AAROM;Right;5 reps;Seated    General Comments        Pertinent Vitals/Pain Pain Assessment: 0-10 Pain Score: 10-Worst pain ever (With ambulation) Pain Location: right knee and medial calf Pain Descriptors / Indicators: Aching Pain Intervention(s): Limited activity within patient's tolerance;Monitored during session;Repositioned;Ice applied    Home Living                      Prior Function            PT Goals (current goals can now be found in the care plan section) Acute Rehab PT Goals Patient Stated Goal: return home after rehab PT Goal Formulation: With patient Time For Goal Achievement: 11/07/16 Potential to Achieve Goals: Good Progress towards  PT goals: Progressing toward goals    Frequency    7X/week      PT Plan Current plan remains appropriate    Co-evaluation              AM-PAC PT "6 Clicks" Daily Activity  Outcome Measure  Difficulty turning over in bed (including adjusting bedclothes, sheets and blankets)?: A Little Difficulty moving from lying on back to sitting on the side of the bed? : Total Difficulty sitting down on and standing up from a chair with arms (e.g., wheelchair, bedside commode, etc,.)?: Total Help needed moving to and from a bed to chair (including a wheelchair)?: A  Little Help needed walking in hospital room?: A Little Help needed climbing 3-5 steps with a railing? : A Lot 6 Click Score: 13    End of Session Equipment Utilized During Treatment: Gait belt Activity Tolerance: Patient tolerated treatment well Patient left: with call bell/phone within reach;in chair Nurse Communication: Mobility status PT Visit Diagnosis: Pain;Other abnormalities of gait and mobility (R26.89) Pain - Right/Left: Right Pain - part of body: Knee     Time: 6184-8592 PT Time Calculation (min) (ACUTE ONLY): 37 min  Charges:  $Gait Training: 8-22 mins $Therapeutic Exercise: 8-22 mins                    G Codes:      Benjiman Core, PTA Acute Rehab [place] 11/01/2016, 4:30 PM

## 2016-11-01 NOTE — Progress Notes (Signed)
Physical Therapy Treatment Patient Details Name: Janet Le MRN: [id number] DOB: [birth year] Today's Date: 11/01/2016    History of Present Illness Pt is 61 yo female who presents for R TKA, PMH: renal cell cancer, IBS, OA, DM    PT Comments    Patient ambulated 115 ft this AM with min guard. Pt overall steady with RW, and needed min cueing for sequencing. R knee snapped into hyperextension 1x during gait, causing pt to buckle slightly. Supervision for bed mobilities. Pt sent HEP booklet home and has not performed any without therapy. Need to provide additional booklet and review remaining exercises in HEP. Patient would benefit from continued skilled PT. Will continue to follow acutely.     Follow Up Recommendations  SNF     Equipment Recommendations  Rolling walker with 5" wheels    Recommendations for Other Services       Precautions / Restrictions Precautions Precautions: Knee Precaution Booklet Issued: No Restrictions Weight Bearing Restrictions: Yes RLE Weight Bearing: Weight bearing as tolerated    Mobility  Bed Mobility Overal bed mobility: Needs Assistance Bed Mobility: Supine to Sit;Sit to Supine     Supine to sit: Supervision     General bed mobility comments: pt able to use hands to assist RLE and bring to EOB.  Transfers Overall transfer level: Needs assistance Equipment used: Rolling walker (2 wheeled) Transfers: Sit to/from Stand Sit to Stand: Min guard         General transfer comment: curing for hand placement with RW.  Ambulation/Gait Ambulation/Gait assistance: Min guard Ambulation Distance (Feet): 115 Feet Assistive device: Rolling walker (2 wheeled) Gait Pattern/deviations: Step-through pattern;Decreased stance time - right;Decreased weight shift to right;Antalgic Gait velocity: decreased Gait velocity interpretation: Below normal speed for age/gender General Gait Details: vc's for erect posture. Pt required 2 standing rest  breaks. R knee snapped into hyperextension. Pt reports knees hyperextend at baseline, however just started hpyerextending during gait after surgery.   Stairs            Wheelchair Mobility    Modified Rankin (Stroke Patients Only)       Balance Overall balance assessment: Needs assistance Sitting-balance support: No upper extremity supported Sitting balance-Leahy Scale: Good     Standing balance support: No upper extremity supported Standing balance-Leahy Scale: Fair Standing balance comment: able to stand at sink to wash hands without UE support                            Cognition Arousal/Alertness: Awake/alert Behavior During Therapy: WFL for tasks assessed/performed Overall Cognitive Status: Within Functional Limits for tasks assessed                                        Exercises      General Comments General comments (skin integrity, edema, etc.): increased swelling      Pertinent Vitals/Pain Pain Assessment: 0-10 Pain Score: 10-Worst pain ever (during ambulation) Pain Location: right knee and medial calf Pain Descriptors / Indicators: Aching    Home Living                      Prior Function            PT Goals (current goals can now be found in the care plan section) Acute Rehab PT Goals Patient Stated Goal: return  home after rehab PT Goal Formulation: With patient Time For Goal Achievement: 11/07/16 Potential to Achieve Goals: Good Progress towards PT goals: Progressing toward goals    Frequency    7X/week      PT Plan Current plan remains appropriate    Co-evaluation              AM-PAC PT "6 Clicks" Daily Activity  Outcome Measure  Difficulty turning over in bed (including adjusting bedclothes, sheets and blankets)?: A Little Difficulty moving from lying on back to sitting on the side of the bed? : Total Difficulty sitting down on and standing up from a chair with arms (e.g.,  wheelchair, bedside commode, etc,.)?: Total Help needed moving to and from a bed to chair (including a wheelchair)?: A Little Help needed walking in hospital room?: A Little Help needed climbing 3-5 steps with a railing? : A Lot 6 Click Score: 13    End of Session Equipment Utilized During Treatment: Gait belt Activity Tolerance: Patient tolerated treatment well Patient left: with call bell/phone within reach;in chair Nurse Communication: Mobility status PT Visit Diagnosis: Pain;Other abnormalities of gait and mobility (R26.89) Pain - Right/Left: Right Pain - part of body: Knee     Time: 0037-0488 PT Time Calculation (min) (ACUTE ONLY): 25 min  Charges:  $Gait Training: 23-37 mins                    G Codes:       Benjiman Core, PTA Acute Rehab [place] 11/01/2016, 10:22 AM

## 2016-11-01 NOTE — Progress Notes (Signed)
Called Dr. Randel Pigg Physican Assistant, Madelyn Brunner, regarding possibilty of patient being discharged to SNF tonight. There was an order that was written in the event patient had a bed and SW had all the details. Ricard Dillon, [place] states "spoke with administrators at [facility]" and a bed offer is available; will come in am to see patient. Before this note was completed Ricard Dillon, [place] called patient to reassure not going tonight; asked nurse to discontinue discharge order until seen in am. Patient verbalized understanding of events to happen.

## 2016-11-02 NOTE — Social Work (Signed)
Clinical Social Worker facilitated patient discharge including contacting patient family and facility to confirm patient discharge plans.  Clinical information faxed to facility and family agreeable with plan.  CSW arranged ambulance transport via [facility] to [facility] . RN to call [phone] report prior to discharge.  Clinical Social Worker will sign off for now as social work intervention is no longer needed. Please consult [place] again if new need arises.  Elissa Hefty, LCSW Clinical Social Worker [phone]

## 2016-11-02 NOTE — Social Work (Addendum)
CSW was advised by [facility] that the insurance company advised them that patient declined [facility] facility placement therefore [facility] discontinued authorization for facility placement. BC[employer] patient care manager alerted patient that she may be billed back pay by [facility] if they did not agree with the BCBS rate of payment, therefore patient did not want to go to [facility]. CSW went to speak with patient who advised that she was unaware that the facility stopped working on the authorization and was not pleased that CSW did not come speak with her until this time. CSW apologized and indicated that CSW was f/u with [facility] based on the selection yesterday for the [facility] facility.  Patient indicated that she did not select [facility] and did not fully understand the process when CSW explained it to her yesterday. She did agree that her PA discussed [facility] with her but when CSW met with her, she still was not clear. CSW confirmed that we did discuss [facility] placement and apologized if she was still unclear on the process for DC. Patient was not pleased and indicated that she was going to call [place]. Patient's family members present. Patient used a walker to bring her cell phone out into the hallway so that [employer] would speak with [employer] case Freight forwarder. CSWs spoke with [facility] patient case manager, Paulita Cradle [phone], [phone]  who stated that patient was unsafe to go home and that their staff would return on Monday. CSWs staffed case with CSW Director who stated that patient would need to discharge tomorrow due to being unable to keep her in the hospital to wait on SNF insurance authorization. Patient is medically ready and has been discharged today.   Patient will be required to discharge on Saturday either with home health or privately paying to go to a SNF until [facility] provides authorization.  CSW alerted patient to DC options and she declined SNF due to  out of pocket expense. CSW advised family that [facility] will discuss DC home with [facility] tomorrow morning.  Elissa Hefty, LCSW Clinical Social Worker [phone]

## 2016-11-02 NOTE — Progress Notes (Signed)
CSW received phone call from Patient regarding discharge plan. Per Patient, she was informed that she will have to discharge tomorrow as she is unable to remain in the hospital for SNF insurance authorization. Per Patient, she has been in communication with her [facility] patient case manager, Paulita Cradle [phone] who reports that she will have her supervisor work on the authorization first thing Monday morning. CSW and ED RN Case Manager discussed options as explained by unit social worker. Patient will be required to discharge on Saturday either with home health or privately paying to go to a SNF until [facility] provides authorization. Case Management to follow up with Patient in the morning regarding home health choice as Patient reports that she cannot pay for SNF out of pocket. This CSW signing off. Please contact should new need(s) arise.    Lorrine Kin, MSW, LCSW [facility] ED/33M Clinical Social Worker [phone]

## 2016-11-02 NOTE — Progress Notes (Signed)
Physical Therapy Treatment Patient Details Name: Janet Le MRN: [id number] DOB: [birth year] Today's Date: 11/02/2016    History of Present Illness Pt is 61 yo female who presents for R TKA, PMH: renal cell cancer, IBS, OA, DM    PT Comments    Pt tolerated treatment well this session. D/c'ing to SNF and NT to give pt bath before d/c which limited session. Continues to require supervision to min guard assist for functional mobility tasks. Current recommendations appropriate. Will continue to follow to maximize functional mobility independence.    Follow Up Recommendations  SNF     Equipment Recommendations  Rolling walker with 5" wheels    Recommendations for Other Services       Precautions / Restrictions Precautions Precautions: Knee Precaution Booklet Issued: Yes (comment) Precaution Comments: Reviewed sitting and supine ther ex with pt.  Restrictions Weight Bearing Restrictions: Yes RLE Weight Bearing: Weight bearing as tolerated    Mobility  Bed Mobility Overal bed mobility: Needs Assistance Bed Mobility: Sit to Supine     Supine to sit: Min assist Sit to supine: Min assist   General bed mobility comments: Min A for RLE management.   Transfers Overall transfer level: Needs assistance Equipment used: Rolling walker (2 wheeled) Transfers: Sit to/from Stand Sit to Stand: Supervision         General transfer comment: Supervision for safety. Pt on BSC upon entry with nursing. Pt demonstrated good hand placement during transfer from [facility].   Ambulation/Gait Ambulation/Gait assistance: Min guard Ambulation Distance (Feet): 10 Feet Assistive device: Rolling walker (2 wheeled) Gait Pattern/deviations: Step-to pattern;Decreased step length - right;Decreased weight shift to right;Antalgic Gait velocity: decreased Gait velocity interpretation: Below normal speed for age/gender General Gait Details: Ambulation limited from [facility] to bed because NT to give bath  beofre d/c. Pt demonstrating slow, antalgic gait, but overall steady.    Stairs            Wheelchair Mobility    Modified Rankin (Stroke Patients Only)       Balance Overall balance assessment: Needs assistance Sitting-balance support: No upper extremity supported Sitting balance-Leahy Scale: Good     Standing balance support: No upper extremity supported Standing balance-Leahy Scale: Fair Standing balance comment: can static stand with out UE support                            Cognition Arousal/Alertness: Awake/alert Behavior During Therapy: WFL for tasks assessed/performed Overall Cognitive Status: Within Functional Limits for tasks assessed                                        Exercises Total Joint Exercises Ankle Circles/Pumps: AROM;Both;Supine;15 reps Quad Sets: AROM;15 reps;Right;Supine Towel Squeeze: AROM;Both;15 reps;Supine Hip ABduction/ADduction: AAROM;Right;10 reps;Supine Long Arc Quad: AROM;Right;10 reps;Seated (partial range) Knee Flexion: AROM;Right;10 reps;Supine (partial range; limited by pain ) Goniometric ROM: 5/53    General Comments        Pertinent Vitals/Pain Pain Assessment: Faces Faces Pain Scale: Hurts even more Pain Location: right knee and medial calf Pain Descriptors / Indicators: Aching Pain Intervention(s): Limited activity within patient's tolerance;Monitored during session;Repositioned    Home Living                      Prior Function            PT  Goals (current goals can now be found in the care plan section) Acute Rehab PT Goals Patient Stated Goal: return home after rehab PT Goal Formulation: With patient Time For Goal Achievement: 11/07/16 Potential to Achieve Goals: Good Progress towards PT goals: Progressing toward goals    Frequency    7X/week      PT Plan Current plan remains appropriate    Co-evaluation              AM-PAC PT "6 Clicks" Daily  Activity  Outcome Measure  Difficulty turning over in bed (including adjusting bedclothes, sheets and blankets)?: A Little Difficulty moving from lying on back to sitting on the side of the bed? : Total Difficulty sitting down on and standing up from a chair with arms (e.g., wheelchair, bedside commode, etc,.)?: Total Help needed moving to and from a bed to chair (including a wheelchair)?: A Little Help needed walking in hospital room?: A Little Help needed climbing 3-5 steps with a railing? : A Lot 6 Click Score: 13    End of Session Equipment Utilized During Treatment: Gait belt Activity Tolerance: Patient tolerated treatment well Patient left: in bed;with call bell/phone within reach Nurse Communication: Mobility status PT Visit Diagnosis: Pain;Other abnormalities of gait and mobility (R26.89) Pain - Right/Left: Right Pain - part of body: Knee     Time: 4536-4680 PT Time Calculation (min) (ACUTE ONLY): 15 min  Charges:  $Gait Training: 8-22 mins $Therapeutic Exercise: 8-22 mins                    G Codes:       Nicky Pugh, PT, DPT  Acute Rehabilitation Services  Pager: [place] 11/02/2016, 1:37 PM

## 2016-11-02 NOTE — Progress Notes (Signed)
Left message with Dr. Randel Pigg answering service regarding patient not discharging tonight per CSW.

## 2016-11-02 NOTE — Progress Notes (Signed)
Called [facility] to give report.  Room not assigned at this time.

## 2016-11-02 NOTE — Progress Notes (Signed)
Subjective: Doing well.  Pain controlled. Agrees with transfer to [facility] for rehab.     Objective: Vital signs in last 24 hours: Temp:  [98.1 F (36.7 C)-98.7 F (37.1 C)] 98.7 F (37.1 C) (05/11 0545) Pulse Rate:  [69-78] 74 (05/11 0545) Resp:  [17-18] 17 (05/11 0545) BP: (125-133)/(67-72) 125/72 (05/11 0545) SpO2:  [98 %-100 %] 99 % (05/11 0545)  Intake/Output from previous day: 05/10 0701 - 05/11 0700 In: 480 [P.O.:480] Out: 350 [Urine:350] Intake/Output this shift: No intake/output data recorded.  No results for input(s): HGB in the last 72 hours. No results for input(s): WBC, RBC, HCT, PLT in the last 72 hours. No results for input(s): NA, K, CL, CO2, BUN, CREATININE, GLUCOSE, CALCIUM in the last 72 hours. No results for input(s): LABPT, INR in the last 72 hours.  Exam] Pleasant female, alert and oriented, NAD.  Knee wound looks good, steris intact.  No drainage or signs of infection.  Calf nontender.    Assessment/Plan: [facility] that [facility] with.  Waiting for social worker to look into this.  Stable for transfer today.     Benjiman Core 11/02/2016, 9:45 AM

## 2016-11-02 NOTE — Progress Notes (Signed)
Received call from [place] at [facility].  Bed still not available so she is unable to accept patient at this time.

## 2016-11-02 NOTE — Clinical Social Work Placement (Signed)
   CLINICAL SOCIAL WORK PLACEMENT  NOTE  Date:  11/02/2016  Patient Details  Name: Janet Le MRN: [id number] Date of Birth: 1956/06/15  Clinical Social Work is seeking post-discharge placement for this patient at the [place] level of care (*CSW will initial, date and re-position this form in  chart as items are completed):  Yes   Patient/family provided with [place] Work Department's list of facilities offering this level of care within the geographic area requested by the patient (or if unable, by the patient's family).  Yes   Patient/family informed of their freedom to choose among providers that offer the needed level of care, that participate in Medicare, Medicaid or managed care program needed by the patient, have an available bed and are willing to accept the patient.  Yes   Patient/family informed of [facility]'s ownership interest in [facility] and [facility], as well as of the fact that they are under no obligation to receive care at these facilities.  PASRR submitted to EDS on 10/31/16     PASRR number received on 10/31/16     Existing PASRR number confirmed on       FL2 transmitted to all facilities in geographic area requested by pt/family on 10/31/16     FL2 transmitted to all facilities within larger geographic area on 10/31/16     Patient informed that his/her managed care company has contracts with or will negotiate with certain facilities, including the following:        Yes   Patient/family informed of bed offers received.  Patient chooses bed at [facility]     Physician recommends and patient chooses bed at      Patient to be transferred to [facility] on 11/02/16.  Patient to be transferred to facility by [facility]     Patient family notified on 11/02/16 of transfer.  Name of family member notified:  son     PHYSICIAN Please sign FL2, Please prepare priority discharge summary, including medications,  Please prepare prescriptions     Additional Comment:    _______________________________________________ Normajean Baxter, LCSW 11/02/2016, 10:57 AM

## 2016-11-02 NOTE — Progress Notes (Signed)
Physical Therapy Treatment Patient Details Name: Janet Le MRN: [id number] DOB: 09-14-1955 Today's Date: 11/02/2016    History of Present Illness Pt is 61 yo female who presents for R TKA, PMH: renal cell cancer, IBS, OA, DM    PT Comments    Pt continuing to progress towards goals, tolerating increased ambulation distance with min guard assist. Wound on R calf, however,  PA addressed and put pink pressure bandage over wound. Current recommendations remain appropriate. Will continue to follow to maximize functional mobility independence.   Follow Up Recommendations  SNF     Equipment Recommendations  Rolling walker with 5" wheels    Recommendations for Other Services       Precautions / Restrictions Precautions Precautions: Knee Precaution Booklet Issued: Yes (comment) Precaution Comments: reviewed supine and seated ther ex. Placed in CPM at end of session.  Restrictions Weight Bearing Restrictions: Yes RLE Weight Bearing: Weight bearing as tolerated    Mobility  Bed Mobility Overal bed mobility: Needs Assistance Bed Mobility: Sit to Supine;Supine to Sit     Supine to sit: Min assist Sit to supine: Min assist   General bed mobility comments: Min A for RLE management during bed mobility.   Transfers Overall transfer level: Needs assistance Equipment used: Rolling walker (2 wheeled) Transfers: Sit to/from Stand Sit to Stand: Min guard         General transfer comment: Min guard for safety. Verbal cues for hand placement.   Ambulation/Gait Ambulation/Gait assistance: Min guard Ambulation Distance (Feet): 100 Feet Assistive device: Rolling walker (2 wheeled) Gait Pattern/deviations: Step-through pattern;Decreased stance time - right;Decreased weight shift to right;Antalgic Gait velocity: decreased Gait velocity interpretation: Below normal speed for age/gender General Gait Details: 100' X 2 this session. Required standing rest break in between gait  sessions secondary to fatigue. Verbal cues for upright posture.    Stairs            Wheelchair Mobility    Modified Rankin (Stroke Patients Only)       Balance Overall balance assessment: Needs assistance Sitting-balance support: No upper extremity supported Sitting balance-Leahy Scale: Good     Standing balance support: No upper extremity supported Standing balance-Leahy Scale: Fair Standing balance comment: can static stand with out UE support                            Cognition Arousal/Alertness: Awake/alert Behavior During Therapy: WFL for tasks assessed/performed Overall Cognitive Status: Within Functional Limits for tasks assessed                                        Exercises Total Joint Exercises Ankle Circles/Pumps: AROM;Both;Supine;15 reps Quad Sets: AROM;15 reps;Right;Supine Long Arc Quad: AROM;Right;10 reps;Seated Goniometric ROM: 5/53    General Comments        Pertinent Vitals/Pain Pain Assessment: Faces Faces Pain Scale: Hurts even more Pain Location: right knee and medial calf Pain Descriptors / Indicators: Aching Pain Intervention(s): Limited activity within patient's tolerance;Monitored during session;Repositioned    Home Living                      Prior Function            PT Goals (current goals can now be found in the care plan section) Acute Rehab PT Goals PT Goal Formulation: With patient Time  For Goal Achievement: 11/07/16 Potential to Achieve Goals: Good Progress towards PT goals: Progressing toward goals    Frequency    7X/week      PT Plan Current plan remains appropriate    Co-evaluation              AM-PAC PT "6 Clicks" Daily Activity  Outcome Measure  Difficulty turning over in bed (including adjusting bedclothes, sheets and blankets)?: A Little Difficulty moving from lying on back to sitting on the side of the bed? : Total Difficulty sitting down on and  standing up from a chair with arms (e.g., wheelchair, bedside commode, etc,.)?: Total Help needed moving to and from a bed to chair (including a wheelchair)?: A Little Help needed walking in hospital room?: A Little Help needed climbing 3-5 steps with a railing? : A Lot 6 Click Score: 13    End of Session Equipment Utilized During Treatment: Gait belt Activity Tolerance: Patient tolerated treatment well Patient left: with call bell/phone within reach;in chair Nurse Communication: Mobility status PT Visit Diagnosis: Pain;Other abnormalities of gait and mobility (R26.89) Pain - Right/Left: Right Pain - part of body: Knee     Time: 8676-1950 PT Time Calculation (min) (ACUTE ONLY): 28 min  Charges:  $Gait Training: 8-22 mins $Therapeutic Exercise: 8-22 mins                    G Codes:       Nicky Pugh, PT, DPT  Acute Rehabilitation Services  Pager: [phone]    Army Melia 11/02/2016, 11:30 AM

## 2016-11-02 NOTE — Progress Notes (Signed)
Report called to [facility] at [facility]).  All questions answered.

## 2016-11-03 ENCOUNTER — Other Ambulatory Visit (INDEPENDENT_AMBULATORY_CARE_PROVIDER_SITE_OTHER): Payer: Self-pay | Admitting: Orthopaedic Surgery

## 2016-11-03 MED ORDER — ONDANSETRON HCL 4 MG PO TABS: 4.0000 mg | ORAL_TABLET | Freq: Three times a day (TID) | ORAL | 0 refills | Status: DC | PRN

## 2016-11-03 MED ORDER — METHOCARBAMOL 500 MG PO TABS: 500.0000 mg | ORAL_TABLET | Freq: Four times a day (QID) | ORAL | 2 refills | Status: DC | PRN

## 2016-11-03 MED ORDER — RIVAROXABAN 10 MG PO TABS: 10.0000 mg | ORAL_TABLET | Freq: Every day | ORAL | 0 refills | Status: DC

## 2016-11-03 NOTE — Progress Notes (Addendum)
14:00 CM received text from [place], Sherron Flemings stating pt no longer qualifies for SNF and is willing to go home with home health services.  CM requested HHPT orders from RN.  CM spoke with pt for choice of home health agency. Pt chooses [facility] to render HHPT.  Pt states she has both a rolling walker and a 3n1 at home.   Referral called to [facility] rep, Jermaine.  No other CM needs were communicated.  CM met with pt in room and pt confirms she has no one to care for her at home; she must be discharged today; she is willing to go to another SNF (she is requesting [facility] but understands it depends on availability and LOG acceptance).  CM spoke with both AD, Thedore Mins and CSW Sherron Flemings who is working on placement for rehab of [place]. No other CM needs were communicated.

## 2016-11-03 NOTE — Progress Notes (Signed)
Physical Therapy Treatment Patient Details Name: Janet Le MRN: [id number] DOB: Dec 16, 1955 Today's Date: 11/03/2016    History of Present Illness Pt is 61 yo female who presents for R TKA, PMH: renal cell cancer, IBS, OA, DM    PT Comments    Pt making progress with mobility/PT goals.  Increased gt distance and required decreased assistance for all mobility.  Pt still needs supervision for all mobility and does not have that at home so this clinician cont's to feel pt would best benefit from SNF to maximize safety and independence with functional mobility.       Follow Up Recommendations  SNF;Home health PT (Home with HHPT if unable to find another facility to dc to. )     Equipment Recommendations       Recommendations for Other Services       Precautions / Restrictions Precautions Precautions: Knee Precaution Comments: Reviewed sitting and supine ther ex with pt.  Restrictions RLE Weight Bearing: Weight bearing as tolerated    Mobility  Bed Mobility                  Transfers Overall transfer level: Needs assistance Equipment used: Rolling walker (2 wheeled) Transfers: Sit to/from Stand Sit to Stand: Supervision            Ambulation/Gait Ambulation/Gait assistance: Supervision Ambulation Distance (Feet): 120 Feet Assistive device: Rolling walker (2 wheeled) Gait Pattern/deviations: Step-to pattern;Step-through pattern Gait velocity: decreased   General Gait Details: cues for increased step length LLE.  Progressing to step through gt pattern   Stairs            Wheelchair Mobility    Modified Rankin (Stroke Patients Only)       Balance                                            Cognition Arousal/Alertness: Awake/alert Behavior During Therapy: WFL for tasks assessed/performed Overall Cognitive Status: Within Functional Limits for tasks assessed                                         Exercises      General Comments General comments (skin integrity, edema, etc.): Increased time spent with pt to call CM regarding discharge home with [facility] services.        Pertinent Vitals/Pain Pain Assessment: 0-10 Pain Score: 5  Pain Location: rt knee Pain Descriptors / Indicators: Aching Pain Intervention(s): Limited activity within patient's tolerance;Monitored during session;Premedicated before session;Repositioned    Home Living                      Prior Function            PT Goals (current goals can now be found in the care plan section) Acute Rehab PT Goals Patient Stated Goal: return home after rehab PT Goal Formulation: With patient Time For Goal Achievement: 11/07/16 Potential to Achieve Goals: Good Progress towards PT goals: Progressing toward goals    Frequency    7X/week      PT Plan Current plan remains appropriate    Co-evaluation              AM-PAC PT "6 Clicks" Daily Activity  Outcome Measure  End of Session Equipment Utilized During Treatment: Gait belt Activity Tolerance: Patient tolerated treatment well Patient left: in chair;with call bell/phone within reach;with family/visitor present Nurse Communication: Mobility status       Time: 0910-1000 PT Time Calculation (min) (ACUTE ONLY): 50 min  Charges:  $Gait Training: 23-37 mins $Self Care/Home Management: 8-22                    G CodesSarajane Marek, [place] 11/03/2016    Sena Hitch 11/03/2016, 10:52 AM

## 2016-11-03 NOTE — Progress Notes (Signed)
Discussed pt's d/c plan with CSW AD.  LOG bed pursuit not appropriate given that pt is walking 120 feet with supervision, as pt's insurance was not likely to authorize SNF stay.  CSW explained to patient who was agreeable to d/c home with [facility] today.  RNCM notified.   Creta Levin, LCSW Weekend Coverage [phone]

## 2016-11-03 NOTE — Progress Notes (Signed)
Orthopedic Tech Progress Note Patient Details:  Janet Le [birth year] [id number]  Patient ID: [name], female   DOB: August 10, 1955, 61 y.o.   MRN: [id number]   [name] 11/03/2016, 12:59 PM Placed pt's rle on cpm @0 -50 degrees @1300 ; rn NOTIFIED

## 2016-11-03 NOTE — Progress Notes (Signed)
Stable for dc today once discharge plan is finalized by SW/CM.

## 2016-11-05 ENCOUNTER — Telehealth (INDEPENDENT_AMBULATORY_CARE_PROVIDER_SITE_OTHER): Payer: Self-pay | Admitting: Orthopedic Surgery

## 2016-11-05 DIAGNOSIS — Z471 Aftercare following joint replacement surgery: Secondary | ICD-10-CM | POA: Diagnosis not present

## 2016-11-05 DIAGNOSIS — Z7901 Long term (current) use of anticoagulants: Secondary | ICD-10-CM | POA: Diagnosis not present

## 2016-11-05 DIAGNOSIS — Z96651 Presence of right artificial knee joint: Secondary | ICD-10-CM | POA: Diagnosis not present

## 2016-11-05 DIAGNOSIS — R7303 Prediabetes: Secondary | ICD-10-CM | POA: Diagnosis not present

## 2016-11-05 DIAGNOSIS — M545 Low back pain: Secondary | ICD-10-CM | POA: Diagnosis not present

## 2016-11-05 NOTE — Telephone Encounter (Signed)
Returned call to [place] with [employer] left message to return call advised message she left concerning patient was was not clear.  [phone]

## 2016-11-06 ENCOUNTER — Telehealth (INDEPENDENT_AMBULATORY_CARE_PROVIDER_SITE_OTHER): Payer: Self-pay | Admitting: Orthopedic Surgery

## 2016-11-06 NOTE — Telephone Encounter (Signed)
PT PHYS THERAPIST CALLED TO STATE SHE IS  RECOMMENDING/REQUESTING 3X WK FOR 2 WKS AND THAT PT FELL THIS MORNING, SAYS SHE IS OK BUT JUST WANTED TO INFORM YOU.  Rachel Moulds [phone]

## 2016-11-07 ENCOUNTER — Telehealth (INDEPENDENT_AMBULATORY_CARE_PROVIDER_SITE_OTHER): Payer: Self-pay | Admitting: Orthopedic Surgery

## 2016-11-07 DIAGNOSIS — Z7901 Long term (current) use of anticoagulants: Secondary | ICD-10-CM | POA: Diagnosis not present

## 2016-11-07 DIAGNOSIS — Z471 Aftercare following joint replacement surgery: Secondary | ICD-10-CM | POA: Diagnosis not present

## 2016-11-07 DIAGNOSIS — R7303 Prediabetes: Secondary | ICD-10-CM | POA: Diagnosis not present

## 2016-11-07 DIAGNOSIS — M545 Low back pain: Secondary | ICD-10-CM | POA: Diagnosis not present

## 2016-11-07 DIAGNOSIS — Z96651 Presence of right artificial knee joint: Secondary | ICD-10-CM | POA: Diagnosis not present

## 2016-11-07 NOTE — Telephone Encounter (Signed)
Janet Le (PT) with [facility] called needing verbal orders for Home Health aid for 2 wk 2. Also, need clarifications on dressing on the back of patient's calf. The number to contact Cecile Hearing is [phone]

## 2016-11-07 NOTE — Telephone Encounter (Signed)
Called, no answer. LM providing verbal orders as requested. Per Dr Marlou Sa advised should use dry dressing on back of patients calf.

## 2016-11-07 NOTE — Telephone Encounter (Signed)
IC her back. No answer. LMVM giving verbal order for P.T.

## 2016-11-09 DIAGNOSIS — M545 Low back pain: Secondary | ICD-10-CM | POA: Diagnosis not present

## 2016-11-09 DIAGNOSIS — R7303 Prediabetes: Secondary | ICD-10-CM | POA: Diagnosis not present

## 2016-11-09 DIAGNOSIS — Z7901 Long term (current) use of anticoagulants: Secondary | ICD-10-CM | POA: Diagnosis not present

## 2016-11-09 DIAGNOSIS — Z471 Aftercare following joint replacement surgery: Secondary | ICD-10-CM | POA: Diagnosis not present

## 2016-11-09 DIAGNOSIS — Z96651 Presence of right artificial knee joint: Secondary | ICD-10-CM | POA: Diagnosis not present

## 2016-11-12 ENCOUNTER — Ambulatory Visit (INDEPENDENT_AMBULATORY_CARE_PROVIDER_SITE_OTHER): Payer: BLUE CROSS/BLUE SHIELD

## 2016-11-12 ENCOUNTER — Encounter (INDEPENDENT_AMBULATORY_CARE_PROVIDER_SITE_OTHER): Payer: Self-pay | Admitting: Orthopedic Surgery

## 2016-11-12 ENCOUNTER — Ambulatory Visit (INDEPENDENT_AMBULATORY_CARE_PROVIDER_SITE_OTHER): Payer: BLUE CROSS/BLUE SHIELD | Admitting: Orthopedic Surgery

## 2016-11-12 DIAGNOSIS — Z96651 Presence of right artificial knee joint: Secondary | ICD-10-CM

## 2016-11-12 MED ORDER — OXYCODONE HCL 5 MG PO TABS: 5.0000 mg | ORAL_TABLET | Freq: Four times a day (QID) | ORAL | 0 refills | Status: DC | PRN

## 2016-11-12 NOTE — Progress Notes (Signed)
Post-Op Visit Note   Patient: Janet Le           Date of Birth: 11/18/1955           MRN: [id number] Visit Date: 11/12/2016 PCP: Binnie Rail, MD   Assessment & Plan:  Chief Complaint:  Chief Complaint  Patient presents with  . Right Knee - Routine Post Op   Visit Diagnoses:  1. Status post right knee replacement     Plan: Erisman is a 61 year old patient now postop right total knee replacement doing CPM machine 2-3 times a day.  95 on CPM currently.  Taking hydrocodone for pain.  On exam she has hyperextension on the left knee to about 30 extension on the right knee is only about 7 or 8.  Flexion is to 95: P collaterals are stable.  Radiographs look good.  Plan is 3 week return for clinical recheck refill oxycodone she does have a blister on the back of the calf but this is healing and has a scar tissue over it.  I will see her back in 3 weeks for clinical recheck  Follow-Up Instructions: No Follow-up on file.   Orders:  Orders Placed This Encounter  Procedures  . XR Knee 1-2 Views Right   No orders of the defined types were placed in this encounter.   Imaging: Xr Knee 1-2 Views Right  Result Date: 11/12/2016 AP lateral view right knee reviewed.  Total knee prosthesis in good position.  any features.  Alignment in the coronal plane near anatomic.  No fractures.  Medial compartment left knee arthritis noted   PMFS History: Patient Active Problem List   Diagnosis Date Noted  . Arthritis of knee 10/30/2016  . S/P knee surgery 10/30/2016  . Burning pain 10/15/2016  . Low back pain 08/01/2016  . Chronic low back pain 07/18/2016  . Prediabetes 05/30/2016  . Cervicalgia 05/30/2016  . Right knee pain 05/30/2016  . Osteopenia 11/18/2015  . Cephalalgia 07/20/2015  . Thrombocytopenia ([place]) 05/30/2015  . Arthritis of left lower extremity 02/02/2015  . Left knee pain 09/08/2014  . Gout 12/11/2013  . Vision disturbance 12/08/2013  . Severe obesity (BMI >=  40) ([place]) 11/06/2013  . Osteoporosis   . Left lumbar radiculopathy 04/13/2012  . Renal cell cancer ([place])   . External hemorrhoid 11/14/2010  . IBS (irritable bowel syndrome) 11/14/2010  . GERD (gastroesophageal reflux disease) 11/14/2010  . HYPERCHOLESTEROLEMIA, MILD 03/16/2010  . Lymphedema 03/15/2010  . ALLERGIC RHINITIS 08/31/2009  . MIGRAINE HEADACHE 06/14/2009  . SINUSITIS, CHRONIC 06/14/2009  . SYNCOPE 05/16/2009  . Essential hypertension 05/06/2009  . CONSTIPATION, CHRONIC 05/06/2009   Past Medical History:  Diagnosis Date  . Allergic rhinitis, cause unspecified   . Anxiety   . Blood dyscrasia    platelets low in past  . Chronic back pain   . Chronic constipation   . Chronic sinusitis   . Depression   . Endometriosis   . ENDOMETRIOSIS 05/06/2009   Qualifier: Diagnosis of  By: Varney Daily RN, Butch Penny    . Esophageal stricture   . GERD (gastroesophageal reflux disease)   . Hemorrhoids   . Hiatal hernia   . Hyperlipidemia   . Hypertension   . Irritable bowel syndrome   . Osteoarthritis   . Personal history of diabetes mellitus    controlled with diet only  . Renal cell cancer ([place]) 11/2010 dx   R, s/p cryoablation 02/16/11  . Syncopal episodes    since childhood  Family History  Problem Relation Age of Onset  . Diabetes Mother   . Kidney disease Father   . Prostate cancer Father   . Colitis Brother   . Leukemia Brother   . Multiple sclerosis Sister   . Colon polyps Unknown   . Colon cancer Neg Hx     Past Surgical History:  Procedure Laterality Date  . BREAST SURGERY     bilateral, cyst removal  . fallopian tube removed    . FUNCTIONAL ENDOSCOPIC SINUS SURGERY    . LASER ABLATION OF THE CERVIX    . 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 TOTAL KNEE ARTHROPLASTY Right 10/30/2016   Procedure: RIGHT TOTAL KNEE ARTHROPLASTY;  Surgeon: Meredith Pel, MD;  Location: [facility];  Service: Orthopedics;  Laterality: Right;  . TUBAL  LIGATION     Social History   Occupational History  . 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 Smoking status: Never Smoker  . Smokeless tobacco: Never Used  . Alcohol use No     Comment: rare  . Drug use: No  . Sexual activity: No

## 2016-11-13 DIAGNOSIS — R7303 Prediabetes: Secondary | ICD-10-CM | POA: Diagnosis not present

## 2016-11-13 DIAGNOSIS — Z96651 Presence of right artificial knee joint: Secondary | ICD-10-CM | POA: Diagnosis not present

## 2016-11-13 DIAGNOSIS — Z7901 Long term (current) use of anticoagulants: Secondary | ICD-10-CM | POA: Diagnosis not present

## 2016-11-13 DIAGNOSIS — M545 Low back pain: Secondary | ICD-10-CM | POA: Diagnosis not present

## 2016-11-13 DIAGNOSIS — Z471 Aftercare following joint replacement surgery: Secondary | ICD-10-CM | POA: Diagnosis not present

## 2016-11-14 DIAGNOSIS — M545 Low back pain: Secondary | ICD-10-CM | POA: Diagnosis not present

## 2016-11-14 DIAGNOSIS — Z7901 Long term (current) use of anticoagulants: Secondary | ICD-10-CM | POA: Diagnosis not present

## 2016-11-14 DIAGNOSIS — R7303 Prediabetes: Secondary | ICD-10-CM | POA: Diagnosis not present

## 2016-11-14 DIAGNOSIS — Z471 Aftercare following joint replacement surgery: Secondary | ICD-10-CM | POA: Diagnosis not present

## 2016-11-14 DIAGNOSIS — Z96651 Presence of right artificial knee joint: Secondary | ICD-10-CM | POA: Diagnosis not present

## 2016-11-16 DIAGNOSIS — M545 Low back pain: Secondary | ICD-10-CM | POA: Diagnosis not present

## 2016-11-16 DIAGNOSIS — R7303 Prediabetes: Secondary | ICD-10-CM | POA: Diagnosis not present

## 2016-11-16 DIAGNOSIS — Z7901 Long term (current) use of anticoagulants: Secondary | ICD-10-CM | POA: Diagnosis not present

## 2016-11-16 DIAGNOSIS — Z96651 Presence of right artificial knee joint: Secondary | ICD-10-CM | POA: Diagnosis not present

## 2016-11-16 DIAGNOSIS — Z471 Aftercare following joint replacement surgery: Secondary | ICD-10-CM | POA: Diagnosis not present

## 2016-11-20 ENCOUNTER — Telehealth (INDEPENDENT_AMBULATORY_CARE_PROVIDER_SITE_OTHER): Payer: Self-pay | Admitting: Orthopedic Surgery

## 2016-11-20 DIAGNOSIS — M1710 Unilateral primary osteoarthritis, unspecified knee: Secondary | ICD-10-CM

## 2016-11-20 NOTE — Telephone Encounter (Signed)
See message below °

## 2016-11-20 NOTE — Telephone Encounter (Signed)
Patient request information for physical therapy

## 2016-11-21 NOTE — Telephone Encounter (Signed)
Patient states she would like to go somewhere closer to her house. She asked if she can go to [facility]

## 2016-11-21 NOTE — Telephone Encounter (Signed)
Y pls clal hx

## 2016-11-21 NOTE — Telephone Encounter (Signed)
Pls send to [place] physical therapy 3 x week for 6 weeks thx

## 2016-11-22 ENCOUNTER — Other Ambulatory Visit (INDEPENDENT_AMBULATORY_CARE_PROVIDER_SITE_OTHER): Payer: Self-pay | Admitting: Orthopaedic Surgery

## 2016-11-22 NOTE — Telephone Encounter (Signed)
Not my patient

## 2016-11-22 NOTE — Telephone Encounter (Signed)
Order made Called patient no answer LMOM 

## 2016-11-22 NOTE — Telephone Encounter (Signed)
Please advise 

## 2016-11-22 NOTE — Telephone Encounter (Signed)
No refill after first 21 Xarelto

## 2016-11-23 ENCOUNTER — Encounter (INDEPENDENT_AMBULATORY_CARE_PROVIDER_SITE_OTHER): Payer: Self-pay | Admitting: Orthopedic Surgery

## 2016-11-23 ENCOUNTER — Ambulatory Visit (INDEPENDENT_AMBULATORY_CARE_PROVIDER_SITE_OTHER): Payer: BLUE CROSS/BLUE SHIELD | Admitting: Orthopedic Surgery

## 2016-11-23 DIAGNOSIS — M1710 Unilateral primary osteoarthritis, unspecified knee: Secondary | ICD-10-CM

## 2016-11-23 NOTE — Progress Notes (Signed)
Post-Op Visit Note   Patient: Janet Le           Date of Birth: 1955/12/07           MRN: [id number] Visit Date: 11/23/2016 PCP: Binnie Rail, MD   Assessment & Plan:  Chief Complaint:  Chief Complaint  Patient presents with  . Right Knee - Wound Check   Visit Diagnoses: No diagnosis found.  Plan: Auvil is a 61 year old patient with right total knee replacement.  Doing well with a knee replacement.  She has a blister from the [place] on the back of her calf.  This looks like it's healing appropriately.  There is granulation tissue circumferentially around.  No evidence of deep infection or drainage or erythema.  Plan at this time is to continue local wound care and I will see her back in 3 weeks for clinical recheck.  No effusion in the knee at this time with excellent range of motion.   Follow-Up Instructions: No Follow-up on file.   Orders:  No orders of the defined types were placed in this encounter.  No orders of the defined types were placed in this encounter.   Imaging: No results found.  PMFS History: Patient Active Problem List   Diagnosis Date Noted  . Arthritis of knee 10/30/2016  . S/P knee surgery 10/30/2016  . Burning pain 10/15/2016  . Low back pain 08/01/2016  . Chronic low back pain 07/18/2016  . Prediabetes 05/30/2016  . Cervicalgia 05/30/2016  . Right knee pain 05/30/2016  . Osteopenia 11/18/2015  . Cephalalgia 07/20/2015  . Thrombocytopenia ([place]) 05/30/2015  . Arthritis of left lower extremity 02/02/2015  . Left knee pain 09/08/2014  . Gout 12/11/2013  . Vision disturbance 12/08/2013  . Severe obesity (BMI >= 40) ([place]) 11/06/2013  . Osteoporosis   . Left lumbar radiculopathy 04/13/2012  . Renal cell cancer ([place])   . External hemorrhoid 11/14/2010  . IBS (irritable bowel syndrome) 11/14/2010  . GERD (gastroesophageal reflux disease) 11/14/2010  . HYPERCHOLESTEROLEMIA, MILD 03/16/2010  . Lymphedema 03/15/2010  . ALLERGIC RHINITIS  08/31/2009  . MIGRAINE HEADACHE 06/14/2009  . SINUSITIS, CHRONIC 06/14/2009  . SYNCOPE 05/16/2009  . Essential hypertension 05/06/2009  . CONSTIPATION, CHRONIC 05/06/2009   Past Medical History:  Diagnosis Date  . Allergic rhinitis, cause unspecified   . Anxiety   . Blood dyscrasia    platelets low in past  . Chronic back pain   . Chronic constipation   . Chronic sinusitis   . Depression   . Endometriosis   . ENDOMETRIOSIS 05/06/2009   Qualifier: Diagnosis of  By: Varney Daily RN, Butch Penny    . Esophageal stricture   . GERD (gastroesophageal reflux disease)   . Hemorrhoids   . Hiatal hernia   . Hyperlipidemia   . Hypertension   . Irritable bowel syndrome   . Osteoarthritis   . Personal history of diabetes mellitus    controlled with diet only  . Renal cell cancer ([place]) 11/2010 dx   R, s/p cryoablation 02/16/11  . Syncopal episodes    since childhood    Family History  Problem Relation Age of Onset  . Diabetes Mother   . Kidney disease Father   . Prostate cancer Father   . Colitis Brother   . Leukemia Brother   . Multiple sclerosis Sister   . Colon polyps Unknown   . Colon cancer Neg Hx     Past Surgical History:  Procedure Laterality Date  . BREAST  SURGERY     bilateral, cyst removal  . fallopian tube removed    . FUNCTIONAL ENDOSCOPIC SINUS SURGERY    . LASER ABLATION OF THE CERVIX    . 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 TOTAL KNEE ARTHROPLASTY Right 10/30/2016   Procedure: RIGHT TOTAL KNEE ARTHROPLASTY;  Surgeon: Meredith Pel, MD;  Location: [facility];  Service: Orthopedics;  Laterality: Right;  . TUBAL LIGATION     Social History   Occupational History  . 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 Smoking status: Never Smoker  . Smokeless tobacco: Never Used  . Alcohol use No     Comment: rare  . Drug use: No  . Sexual activity: No

## 2016-11-27 ENCOUNTER — Telehealth: Payer: Self-pay | Admitting: Internal Medicine

## 2016-11-27 NOTE — Telephone Encounter (Signed)
Spoke with pt, she states that she has a wound on her leg that will not heal and numbness and tingling in her feet. She has an appt on 6/7 and we will discuss further with her then

## 2016-11-27 NOTE — Telephone Encounter (Signed)
I am not sure why she wants that done.  She had an a1c done in April.

## 2016-11-27 NOTE — Telephone Encounter (Signed)
Patient states she has a appointment on Thursday June 7. She would like to have her sugar tested. But she wants to do the test with the glucose drink. Please advise. Thank you.

## 2016-11-27 NOTE — Telephone Encounter (Signed)
Is this something we do here?

## 2016-11-27 NOTE — Telephone Encounter (Signed)
noted 

## 2016-11-28 NOTE — Progress Notes (Signed)
Subjective:    Patient ID: Janet Le, female    DOB: 1955-10-23, 61 y.o.   MRN: [id number]  HPI The patient is here for follow up.  Hypertension: She is taking her medication daily. She is compliant with a low sodium diet.  She denies chest pain, palpitations, shortness of breath and regular headaches. She is not exercising regularly.  She does not monitor her blood pressure at home, but when PT has come after her knee surgery her BP has been low.  It was also low prior to surgery.   Osteoporosis:  She is taking fosamax weekly as prescribed.  Her dexa is up to date and will be repeated 2019.    Prediabetes:  She is compliant with a low sugar/carbohydrate diet.  She is doing PT regularly since her knee surgery, but not albe to do other exercises.   Obesity: she is taking saxenda.  She has been losing weight.  She is hoping to increase after further.   Leg wound:  It started after her TKR 5/8.  She developed a blister from the wrapping of the knee.  It is not healing.  There has been some clear discharge.  She denies fever/chills.  She has putting vitamin E on it and antibiotic topical ointment and kept it wrapped.  Orthopedics advised not putting anything on it and keeping it wrapped.    Burning achy feeling in legs:  This extends from feet to legs. She had an EMG and it was negative for nerve damage.  She does have chronic lower back pain and sees pain management  Medications and allergies reviewed with patient and updated if appropriate.  Patient Active Problem List   Diagnosis Date Noted  . Arthritis of knee 10/30/2016  . S/P knee surgery 10/30/2016  . Burning pain 10/15/2016  . Low back pain 08/01/2016  . Chronic low back pain 07/18/2016  . Prediabetes 05/30/2016  . Cervicalgia 05/30/2016  . Right knee pain 05/30/2016  . Osteopenia 11/18/2015  . Cephalalgia 07/20/2015  . Thrombocytopenia ([place]) 05/30/2015  . Arthritis of left lower extremity 02/02/2015  . Left knee  pain 09/08/2014  . Gout 12/11/2013  . Vision disturbance 12/08/2013  . Severe obesity (BMI >= 40) ([place]) 11/06/2013  . Osteoporosis   . Left lumbar radiculopathy 04/13/2012  . Renal cell cancer ([place])   . External hemorrhoid 11/14/2010  . IBS (irritable bowel syndrome) 11/14/2010  . GERD (gastroesophageal reflux disease) 11/14/2010  . HYPERCHOLESTEROLEMIA, MILD 03/16/2010  . Lymphedema 03/15/2010  . ALLERGIC RHINITIS 08/31/2009  . MIGRAINE HEADACHE 06/14/2009  . SINUSITIS, CHRONIC 06/14/2009  . SYNCOPE 05/16/2009  . Essential hypertension 05/06/2009  . CONSTIPATION, CHRONIC 05/06/2009    Current Outpatient Prescriptions on File Prior to Visit  Medication Sig Dispense Refill  . ACCU-CHEK SOFTCLIX LANCETS lancets by Other route. Use twice a day for blood sugar     . alendronate (FOSAMAX) 70 MG tablet Take 1 tablet (70 mg total) by mouth once a week. Take with a full glass of water on an empty stomach. 12 tablet 3  . amLODipine (NORVASC) 10 MG tablet Take 1 tablet (10 mg total) by mouth daily. 90 tablet 3  . atorvastatin (LIPITOR) 10 MG tablet TAKE 1 TABLET DAILY 90 tablet 3  . calcium-vitamin D (OSCAL WITH D) 500-200 MG-UNIT tablet Take 1 tablet by mouth daily.     . Diclofenac Sodium 2 % SOLN Apply 1 pump twice daily. (Patient taking differently: Apply 1 application topically 2 (two)  times daily as needed (PAIN). Apply 1 pump twice daily.) 112 g 1  . fluticasone (FLONASE) 50 MCG/ACT nasal spray Place 1 spray into both nostrils daily. 16 g 2  . furosemide (LASIX) 20 MG tablet TAKE 1 TABLET DAILY 90 tablet 0  . glucose blood (ONETOUCH VERIO) test strip Use one strip to check blood sugar daily 100 each 4  . HYDROmorphone (DILAUDID) 2 MG tablet Take 1 tablet (2 mg total) by mouth every 4 (four) hours as needed for severe pain. 45 tablet 0  . Insulin Pen Needle (NOVOFINE) 32G X 6 MM MISC Use daily with Saxenda 100 each 1  . Linaclotide (LINZESS) 145 MCG CAPS capsule Take 1 capsule (145 mcg  total) by mouth daily. (Patient taking differently: Take 145 mcg by mouth daily as needed (CONSTIPATION). ) 90 capsule 1  . Liraglutide -Weight Management (SAXENDA) 18 MG/3ML SOPN Inject 0.6 mg into the skin daily. For one week, then increase 0.6 mg weekly until you reach 3 mg daily 15 pen 1  . methocarbamol (ROBAXIN) 500 MG tablet Take 1 tablet (500 mg total) by mouth every 6 (six) hours as needed for muscle spasms. 30 tablet 2  . nystatin (MYCOSTATIN) 100000 UNIT/ML suspension Take 5 mLs by mouth 3 (three) times daily. SWISH AND SWALLOW    . ondansetron (ZOFRAN) 4 MG tablet Take 1-2 tablets (4-8 mg total) by mouth every 8 (eight) hours as needed for nausea or vomiting. 40 tablet 0  . ONETOUCH DELICA LANCETS 95J MISC 1 each by Does not apply route daily. Use to help check blood sugars once daily Dx 250.00 100 each 3  . oxyCODONE (OXY IR/ROXICODONE) 5 MG immediate release tablet Take 1 tablet (5 mg total) by mouth every 6 (six) hours as needed for severe pain. 60 tablet 0  . Prenatal Vit-Fe Fumarate-FA (PNV PRENATAL PLUS MULTIVITAMIN) 27-1 MG TABS TAKE 1 TABLET DAILY AT 12 NOON 60 tablet 1  . propranolol (INDERAL) 80 MG tablet Take 1 tablet (80 mg total) by mouth 2 (two) times daily. 28 tablet 0  . rivaroxaban (XARELTO) 10 MG TABS tablet Take 1 tablet (10 mg total) by mouth daily with breakfast. 21 tablet 0  . terbinafine (LAMISIL) 250 MG tablet     . valsartan (DIOVAN) 320 MG tablet Take 1 tablet (320 mg total) by mouth daily. 90 tablet 3  . zolpidem (AMBIEN) 5 MG tablet Take 1 tablet (5 mg total) by mouth at bedtime as needed for sleep. 30 tablet 0   No current facility-administered medications on file prior to visit.     Past Medical History:  Diagnosis Date  . Allergic rhinitis, cause unspecified   . Anxiety   . Blood dyscrasia    platelets low in past  . Chronic back pain   . Chronic constipation   . Chronic sinusitis   . Depression   . Endometriosis   . ENDOMETRIOSIS 05/06/2009    Qualifier: Diagnosis of  By: Varney Daily RN, Butch Penny    . Esophageal stricture   . GERD (gastroesophageal reflux disease)   . Hemorrhoids   . Hiatal hernia   . Hyperlipidemia   . Hypertension   . Irritable bowel syndrome   . Osteoarthritis   . Personal history of diabetes mellitus    controlled with diet only  . Renal cell cancer ([place]) 11/2010 dx   R, s/p cryoablation 02/16/11  . Syncopal episodes    since childhood    Past Surgical History:  Procedure Laterality Date  .  BREAST SURGERY     bilateral, cyst removal  . fallopian tube removed    . FUNCTIONAL ENDOSCOPIC SINUS SURGERY    . LASER ABLATION OF THE CERVIX    . 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 TOTAL KNEE ARTHROPLASTY Right 10/30/2016   Procedure: RIGHT TOTAL KNEE ARTHROPLASTY;  Surgeon: Meredith Pel, MD;  Location: [facility];  Service: Orthopedics;  Laterality: Right;  . TUBAL LIGATION      Social History   Social History  . Marital status: Married    Spouse name: N/A  . Number of children: N/A  . Years of education: N/A   Occupational History  . 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 Smoking status: Never Smoker  . Smokeless tobacco: Never Used  . Alcohol use No     Comment: rare  . Drug use: No  . Sexual activity: No   Other Topics Concern  . Not on file   Social History Narrative  . No narrative on file    Family History  Problem Relation Age of Onset  . Diabetes Mother   . Kidney disease Father   . Prostate cancer Father   . Colitis Brother   . Leukemia Brother   . Multiple sclerosis Sister   . Colon polyps Unknown   . Colon cancer Neg Hx     Review of Systems  Constitutional: Negative for chills and fever.  Respiratory: Negative for cough, shortness of breath and wheezing.   Cardiovascular: Positive for leg swelling. Negative for chest pain and palpitations.  Skin: Positive for wound.  Neurological:  Negative for dizziness, light-headedness and headaches.       Objective:   Vitals:   11/29/16 1001  BP: (!) 82/58  Pulse: 79  Resp: 16  Temp: 98 F (36.7 C)   Wt Readings from Last 3 Encounters:  11/29/16 214 lb (97.1 kg)  10/18/16 218 lb (98.9 kg)  10/18/16 218 lb 1.6 oz (98.9 kg)   Body mass index is 39.78 kg/m.   Physical Exam    Constitutional: Appears well-developed and well-nourished. No distress.  HENT:  Head: Normocephalic and atraumatic.  Neck: Neck supple. No tracheal deviation present. No thyromegaly present.  No cervical lymphadenopathy Cardiovascular: Normal rate, regular rhythm and normal heart sounds.   No murmur heard. No carotid bruit .  Mild b/l LE edema Pulmonary/Chest: Effort normal and breath sounds normal. No respiratory distress. No has no wheezes. No rales.  Skin: Skin is warm and dry. Not diaphoretic. tangerine sized non healing ulcer with central eschar, clear drainage, no surrounding erythema or swelling Psychiatric: Normal mood and affect. Behavior is normal.      Assessment & Plan:    See Problem List for Assessment and Plan of chronic medical problems.

## 2016-11-29 ENCOUNTER — Ambulatory Visit (INDEPENDENT_AMBULATORY_CARE_PROVIDER_SITE_OTHER): Payer: BLUE CROSS/BLUE SHIELD | Admitting: Internal Medicine

## 2016-11-29 ENCOUNTER — Encounter: Payer: Self-pay | Admitting: Internal Medicine

## 2016-11-29 VITALS — BP 82/58 | HR 79 | Temp 98.0°F | Resp 16 | Wt 214.0 lb

## 2016-11-29 DIAGNOSIS — R7303 Prediabetes: Secondary | ICD-10-CM | POA: Diagnosis not present

## 2016-11-29 DIAGNOSIS — I10 Essential (primary) hypertension: Secondary | ICD-10-CM

## 2016-11-29 DIAGNOSIS — L97919 Non-pressure chronic ulcer of unspecified part of right lower leg with unspecified severity: Secondary | ICD-10-CM | POA: Diagnosis not present

## 2016-11-29 DIAGNOSIS — R52 Pain, unspecified: Secondary | ICD-10-CM

## 2016-11-29 DIAGNOSIS — M810 Age-related osteoporosis without current pathological fracture: Secondary | ICD-10-CM | POA: Diagnosis not present

## 2016-11-29 MED ORDER — AMLODIPINE BESYLATE 5 MG PO TABS: 5.0000 mg | ORAL_TABLET | Freq: Every day | ORAL | 3 refills | Status: DC

## 2016-11-29 NOTE — Assessment & Plan Note (Signed)
Continue fosamax - started 06/2012 Next dexa 2019

## 2016-11-29 NOTE — Assessment & Plan Note (Signed)
From ulcer that was from the bandages used for her R TKR Present for one month - not healing Clear discharge, no evidence of infection Will refer to wound center - has central eschar - may need debridement

## 2016-11-29 NOTE — Assessment & Plan Note (Signed)
Will hold off on checking a1c - it was in the normal range two months ago F/u in 4 months  - will recheck it then Continue weight loss efforts

## 2016-11-29 NOTE — Assessment & Plan Note (Signed)
Continuing to lose weight with saxenda - will continue Increase activity

## 2016-11-29 NOTE — Assessment & Plan Note (Signed)
BP low and has been low - ? Related to weight loss Decrease amlodipine to 5 mg daily Continue other meds Continue to monitor - mostly PT is checking it

## 2016-11-29 NOTE — Patient Instructions (Addendum)
  Test(s) ordered today. Your results will be released to [facility] (or called to you) after review, usually within 72hours after test completion. If any changes need to be made, you will be notified at that same time.   Medications reviewed and updated.  Changes include decreasing amlodipine to 5 mg daily.   Your prescription(s) have been submitted to your pharmacy. Please take as directed and contact our office if you believe you are having problem(s) with the medication(s).  A referral was ordered for the wound clinic  Please followup in 4 months

## 2016-11-29 NOTE — Assessment & Plan Note (Signed)
B/l feet and lower legs EMG negative ? From back or vascular B12 normal

## 2016-12-04 ENCOUNTER — Telehealth (INDEPENDENT_AMBULATORY_CARE_PROVIDER_SITE_OTHER): Payer: Self-pay | Admitting: *Deleted

## 2016-12-04 ENCOUNTER — Encounter (HOSPITAL_BASED_OUTPATIENT_CLINIC_OR_DEPARTMENT_OTHER): Payer: BLUE CROSS/BLUE SHIELD | Attending: Surgery

## 2016-12-04 DIAGNOSIS — Z6839 Body mass index (BMI) 39.0-39.9, adult: Secondary | ICD-10-CM | POA: Diagnosis not present

## 2016-12-04 DIAGNOSIS — Z96651 Presence of right artificial knee joint: Secondary | ICD-10-CM | POA: Diagnosis not present

## 2016-12-04 DIAGNOSIS — I890 Lymphedema, not elsewhere classified: Secondary | ICD-10-CM | POA: Diagnosis not present

## 2016-12-04 DIAGNOSIS — L97212 Non-pressure chronic ulcer of right calf with fat layer exposed: Secondary | ICD-10-CM | POA: Insufficient documentation

## 2016-12-04 DIAGNOSIS — I10 Essential (primary) hypertension: Secondary | ICD-10-CM | POA: Insufficient documentation

## 2016-12-04 DIAGNOSIS — E119 Type 2 diabetes mellitus without complications: Secondary | ICD-10-CM | POA: Diagnosis not present

## 2016-12-04 DIAGNOSIS — L97919 Non-pressure chronic ulcer of unspecified part of right lower leg with unspecified severity: Secondary | ICD-10-CM | POA: Diagnosis not present

## 2016-12-04 DIAGNOSIS — S81801A Unspecified open wound, right lower leg, initial encounter: Secondary | ICD-10-CM | POA: Diagnosis not present

## 2016-12-04 NOTE — Telephone Encounter (Signed)
I called lmom

## 2016-12-04 NOTE — Telephone Encounter (Signed)
IC patient. She would like to discuss this with you. She has several concerns about her post op wounds that she had to be referred to wound care. Please call patient

## 2016-12-04 NOTE — Telephone Encounter (Signed)
Pt was calling to let Dr. Marlou Sa know that she is being treated at the wound center and she was referred by her primary care because the wound was not getting better. She saw Dr. Jeanette Caprice (not sure of the spelling). Asking for a call back.

## 2016-12-05 ENCOUNTER — Ambulatory Visit (INDEPENDENT_AMBULATORY_CARE_PROVIDER_SITE_OTHER): Payer: BLUE CROSS/BLUE SHIELD | Admitting: Orthopedic Surgery

## 2016-12-06 ENCOUNTER — Ambulatory Visit: Payer: BLUE CROSS/BLUE SHIELD | Admitting: Surgery

## 2016-12-10 ENCOUNTER — Ambulatory Visit: Payer: BLUE CROSS/BLUE SHIELD | Attending: Internal Medicine

## 2016-12-10 DIAGNOSIS — M25561 Pain in right knee: Secondary | ICD-10-CM | POA: Insufficient documentation

## 2016-12-10 DIAGNOSIS — R600 Localized edema: Secondary | ICD-10-CM | POA: Diagnosis not present

## 2016-12-10 DIAGNOSIS — M25661 Stiffness of right knee, not elsewhere classified: Secondary | ICD-10-CM | POA: Diagnosis not present

## 2016-12-10 DIAGNOSIS — R262 Difficulty in walking, not elsewhere classified: Secondary | ICD-10-CM | POA: Insufficient documentation

## 2016-12-10 DIAGNOSIS — Z96651 Presence of right artificial knee joint: Secondary | ICD-10-CM | POA: Insufficient documentation

## 2016-12-10 NOTE — Therapy (Addendum)
[place], [place], [address] Phone: [phone]   Fax:  [phone]  Physical Therapy Evaluation  Patient Details  Name: Janet Le MRN: [id number] Date of Birth: 1956-05-08 Referring Provider: Bonnell Public, MD  Encounter Date: 12/10/2016      PT End of Session - 12/10/16 1325    Visit Number 1   Number of Visits 24   Date for PT Re-Evaluation 03/01/17   Authorization Type BCBS   PT Start Time 1225   PT Stop Time 1315   PT Time Calculation (min) 50 min   Activity Tolerance Patient tolerated treatment well;No increased pain   Behavior During Therapy WFL for tasks assessed/performed      Past Medical History:  Diagnosis Date  . Allergic rhinitis, cause unspecified   . Anxiety   . Blood dyscrasia    platelets low in past  . Chronic back pain   . Chronic constipation   . Chronic sinusitis   . Depression   . Endometriosis   . ENDOMETRIOSIS 05/06/2009   Qualifier: Diagnosis of  By: Varney Daily RN, Butch Penny    . Esophageal stricture   . GERD (gastroesophageal reflux disease)   . Hemorrhoids   . Hiatal hernia   . Hyperlipidemia   . Hypertension   . Irritable bowel syndrome   . Osteoarthritis   . Personal history of diabetes mellitus    controlled with diet only  . Renal cell cancer ([place]) 11/2010 dx   R, s/p cryoablation 02/16/11  . Syncopal episodes    since childhood    Past Surgical History:  Procedure Laterality Date  . BREAST SURGERY     bilateral, cyst removal  . fallopian tube removed    . FUNCTIONAL ENDOSCOPIC SINUS SURGERY    . LASER ABLATION OF THE CERVIX    . 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 TOTAL KNEE ARTHROPLASTY Right 10/30/2016   Procedure: RIGHT TOTAL KNEE ARTHROPLASTY;  Surgeon: Meredith Pel, MD;  Location: [facility];  Service: Orthopedics;  Laterality: Right;  . TUBAL LIGATION      There were no vitals filed for this visit.        Subjective Assessment - 12/10/16 1233    Subjective She reports post TKA RT.  She reports pain limited activity so had surgery.   Knee was bone on bone.   Some exer daily.    She walks with walker most of the time   Pertinent History neck and back pain , pain into shoulders.    Limitations Standing;Walking;House hold activities   How long can you stand comfortably? not sure   How long can you walk comfortably? Not sure.    Patient Stated Goals She wants to be independent with home tasks.     Currently in Pain? Yes   Pain Score 4    Pain Location Knee   Pain Orientation Right   Pain Type Surgical pain   Pain Onset More than a month ago   Pain Frequency Constant   Aggravating Factors  Exercises,    Pain Relieving Factors meds, resting off feet   Multiple Pain Sites --  all joints in body per pt.             [facility] PT Assessment - 12/10/16 0001      Assessment   Medical Diagnosis RT TKA   Referring Provider Bonnell Public, MD   Onset Date/Surgical  Date 10/30/16   Next MD Visit 12/13/16   Prior Therapy HHPT 5 visits     Precautions   Precautions None   Precaution Comments BLISTER posterior medial calf proximal      Restrictions   Weight Bearing Restrictions No     Balance Screen   Has the patient fallen in the past 6 months Yes   How many times? 1  slipped of side of bed   Has the patient had a decrease in activity level because of a fear of falling?  Yes  due to [place] residence   Living Arrangements Other relatives   Type of Home Apartment   Home Access Level entry   Home Layout One level     Prior Function   Level of Independence Needs assistance with ADLs;Needs assistance with homemaking;Requires assistive device for independence   Vocation Unemployed  Wants to try to get disability     Cognition   Overall Cognitive Status Within Functional Limits for tasks assessed     Observation/Other Assessments    Focus on Therapeutic Outcomes (FOTO)  59% limited     Observation/Other Assessments-Edema    Edema Circumferential     Circumferential Edema   Circumferential - Right 56 cm   Circumferential - Left  54 cm     Posture/Postural Control   Posture/Postural Control No significant limitations     ROM / Strength   AROM / PROM / Strength AROM;PROM;Strength     AROM   AROM Assessment Site Knee   Right/Left Knee Right;Left   Right Knee Extension 0   Right Knee Flexion 100   Left Knee Extension --  =10   Left Knee Flexion 115     PROM   PROM Assessment Site Knee   Right/Left Knee Right   Right Knee Flexion 104     Strength   Overall Strength Comments Hip strength 3/5 to 3+/5      Strength Assessment Site Knee   Right/Left Knee Right;Left   Right Knee Flexion 4+/5   Right Knee Extension 4+/5   Left Knee Flexion 5/5   Left Knee Extension 5/5     Flexibility   Soft Tissue Assessment /Muscle Length yes   Hamstrings 70 degrees RT , 80 degrees LT             Objective measurements completed on examination: See above findings.                  PT Education - 12/10/16 1324    Education provided Yes   Education Details POC , need to exercise more at home , OK to use walker to be safe   Person(s) Educated Patient   Methods Explanation   Comprehension Verbalized understanding          PT Short Term Goals - 12/10/16 1336      PT SHORT TERM GOAL #1   Title She will be independent with inital HEP   Time 4   Period Weeks   Status New     PT SHORT TERM GOAL #2   Title She will improve active flexion to 115 degrees   Time 4   Period Weeks   Status New     PT SHORT TERM GOAL #3   Title she will be walking in home without device   Time 4   Period Weeks   Status New     PT  SHORT TERM GOAL #4   Title She will report decr pain 50% with activity on feet.    Time 4   Period Weeks   Status New           PT Long Term Goals - 12/10/16 1338       PT LONG TERM GOAL #1   Title She will be independent with all HEP issued   Time 12   Period Weeks   Status New     PT LONG TERM GOAL #2   Title She will report pain as intermittant   Time 12   Period Weeks   Status New     PT LONG TERM GOAL #3   Title She will return to normal home tasks with 1-2 max pain   Time 12   Period Weeks   Status New     PT LONG TERM GOAL #4   Title She will walk without device in and out of home   Time 12   Period Weeks   Status New     PT LONG TERM GOAL #5   Title She will improve hip abduction strength to 4/5 or better to imptrove stability on feet.    Time 12   Period Weeks   Status New     PT LONG TERM GOAL #6   Title FOTO score improved to < 40% limited   Time 12   Period Weeks   Status New                Plan - 12/10/16 1326    Clinical Impression Statement  Mcenery presents post TKA R . She has limits with ROM , walking with wlaker and pain . She is asssited by family at home .    History and Personal Factors relevant to plan of care: chronic joint pain most joints of body   Clinical Presentation Stable   Clinical Decision Making Low   Rehab Potential Good   PT Frequency 2x / week   PT Duration 12 weeks   PT Treatment/Interventions Cryotherapy;Gait training;Stair training;Passive range of motion;Patient/family education;Therapeutic exercise;Balance training;Therapeutic activities;Manual techniques;Taping   PT Next Visit Plan REview HEP, stength of hips and knees , gait, flexion stretching   Consulted and Agree with Plan of Care Patient      Patient will benefit from skilled therapeutic intervention in order to improve the following deficits and impairments:  Pain, Decreased activity tolerance, Decreased strength, Increased edema, Decreased range of motion, Difficulty walking, Increased muscle spasms  Visit Diagnosis: History of total knee arthroplasty, right - Plan: PT plan of care cert/re-cert  Stiffness of right  knee, not elsewhere classified - Plan: PT plan of care cert/re-cert  Right knee pain, unspecified chronicity - Plan: PT plan of care cert/re-cert  Difficulty in walking, not elsewhere classified - Plan: PT plan of care cert/re-cert  Localized edema - Plan: PT plan of care cert/re-cert     Problem List Patient Active Problem List   Diagnosis Date Noted  . Ulcer of right lower extremity ([place]) 11/29/2016  . Arthritis of knee 10/30/2016  . S/P knee surgery 10/30/2016  . Burning pain 10/15/2016  . Low back pain 08/01/2016  . Chronic low back pain 07/18/2016  . Prediabetes 05/30/2016  . Cervicalgia 05/30/2016  . Right knee pain 05/30/2016  . Osteopenia 11/18/2015  . Cephalalgia 07/20/2015  . Thrombocytopenia ([place]) 05/30/2015  . Arthritis of left lower extremity 02/02/2015  . Left knee pain 09/08/2014  . Gout 12/11/2013  .  Vision disturbance 12/08/2013  . Severe obesity (BMI >= 40) ([place]) 11/06/2013  . Osteoporosis   . Left lumbar radiculopathy 04/13/2012  . Renal cell cancer ([place])   . External hemorrhoid 11/14/2010  . IBS (irritable bowel syndrome) 11/14/2010  . GERD (gastroesophageal reflux disease) 11/14/2010  . HYPERCHOLESTEROLEMIA, MILD 03/16/2010  . Lymphedema 03/15/2010  . ALLERGIC RHINITIS 08/31/2009  . MIGRAINE HEADACHE 06/14/2009  . SINUSITIS, CHRONIC 06/14/2009  . SYNCOPE 05/16/2009  . Essential hypertension 05/06/2009  . CONSTIPATION, CHRONIC 05/06/2009    Darrel Hoover  PT 12/10/2016, 4:21 PM  [facility] [address] [place], [place], [address] Phone: [phone]   Fax:  [phone]  Name: Janet Le MRN: [id number] Date of Birth: June 19, 1956

## 2016-12-11 DIAGNOSIS — E119 Type 2 diabetes mellitus without complications: Secondary | ICD-10-CM | POA: Diagnosis not present

## 2016-12-11 DIAGNOSIS — L97212 Non-pressure chronic ulcer of right calf with fat layer exposed: Secondary | ICD-10-CM | POA: Diagnosis not present

## 2016-12-11 DIAGNOSIS — L97919 Non-pressure chronic ulcer of unspecified part of right lower leg with unspecified severity: Secondary | ICD-10-CM | POA: Diagnosis not present

## 2016-12-11 DIAGNOSIS — Z96651 Presence of right artificial knee joint: Secondary | ICD-10-CM | POA: Diagnosis not present

## 2016-12-11 DIAGNOSIS — I10 Essential (primary) hypertension: Secondary | ICD-10-CM | POA: Diagnosis not present

## 2016-12-11 DIAGNOSIS — Z6839 Body mass index (BMI) 39.0-39.9, adult: Secondary | ICD-10-CM | POA: Diagnosis not present

## 2016-12-11 DIAGNOSIS — I890 Lymphedema, not elsewhere classified: Secondary | ICD-10-CM | POA: Diagnosis not present

## 2016-12-11 DIAGNOSIS — S81801A Unspecified open wound, right lower leg, initial encounter: Secondary | ICD-10-CM | POA: Diagnosis not present

## 2016-12-13 ENCOUNTER — Ambulatory Visit (INDEPENDENT_AMBULATORY_CARE_PROVIDER_SITE_OTHER): Payer: BLUE CROSS/BLUE SHIELD | Admitting: Orthopedic Surgery

## 2016-12-13 ENCOUNTER — Encounter (INDEPENDENT_AMBULATORY_CARE_PROVIDER_SITE_OTHER): Payer: Self-pay | Admitting: Orthopedic Surgery

## 2016-12-13 DIAGNOSIS — M1710 Unilateral primary osteoarthritis, unspecified knee: Secondary | ICD-10-CM

## 2016-12-13 NOTE — Progress Notes (Signed)
Post-Op Visit Note   Patient: Janet Le           Date of Birth: May 22, 1956           MRN: [id number] Visit Date: 12/13/2016 PCP: Binnie Rail, MD   Assessment & Plan:  Chief Complaint:  Chief Complaint  Patient presents with  . Right Knee - Routine Post Op   Visit Diagnoses: No diagnosis found.  Plan: Marando is a 61 year old female with right knee replacement performed 10/30/2016.  Her knee is doing well in terms of its functionality.  She had complex preoperative deformity with hyperextension but currently she has excellent motion in the knee.  She has about less than 10 of hyperextension on the right leg very good collateral ligament stability and flexion easily past 90.  She is walking well with a walker.  She does have a blister on the back part of her calf which measures about 3 by 4 cm.  In comparison to the last time I saw it it has decreased fibrinous tissue and increased granulation tissue..  She has been seeing someone at the wound care center for this posterior blister.  He has recommended Santyl ointment which she obtained.  Patient does report a history of lymphedema and previously having compression hose.  She has an appropriate amount of leg swelling postoperatively on the right side.  Does not appear to be excessive in terms of pitting edema or significant increase in his calf circumference left versus right.  I asked Dr. Sharol Given, and look at the blister and granulation tissue bed.  He recommended either a compression sock or twice a week wraps in order to facilitate healing.  Patient is in a tough social situation with her housing and job situation.  Finances are difficult at this time for the patient.  For that reason we will adopt Dr. Jess Barters recommendation for increased compression with compression hose that we have here in the office and continue with wound care recommendations as her sister does appear to be improving.  I'll see her back in 4 weeks for  recheck.  Follow-Up Instructions: Return in about 4 weeks (around 01/10/2017).   Orders:  No orders of the defined types were placed in this encounter.  No orders of the defined types were placed in this encounter.   Imaging: No results found.  PMFS History: Patient Active Problem List   Diagnosis Date Noted  . Ulcer of right lower extremity ([place]) 11/29/2016  . Arthritis of knee 10/30/2016  . S/P knee surgery 10/30/2016  . Burning pain 10/15/2016  . Low back pain 08/01/2016  . Chronic low back pain 07/18/2016  . Prediabetes 05/30/2016  . Cervicalgia 05/30/2016  . Right knee pain 05/30/2016  . Osteopenia 11/18/2015  . Cephalalgia 07/20/2015  . Thrombocytopenia ([place]) 05/30/2015  . Arthritis of left lower extremity 02/02/2015  . Left knee pain 09/08/2014  . Gout 12/11/2013  . Vision disturbance 12/08/2013  . Severe obesity (BMI >= 40) ([place]) 11/06/2013  . Osteoporosis   . Left lumbar radiculopathy 04/13/2012  . Renal cell cancer ([place])   . External hemorrhoid 11/14/2010  . IBS (irritable bowel syndrome) 11/14/2010  . GERD (gastroesophageal reflux disease) 11/14/2010  . HYPERCHOLESTEROLEMIA, MILD 03/16/2010  . Lymphedema 03/15/2010  . ALLERGIC RHINITIS 08/31/2009  . MIGRAINE HEADACHE 06/14/2009  . SINUSITIS, CHRONIC 06/14/2009  . SYNCOPE 05/16/2009  . Essential hypertension 05/06/2009  . CONSTIPATION, CHRONIC 05/06/2009   Past Medical History:  Diagnosis Date  . Allergic rhinitis,  cause unspecified   . Anxiety   . Blood dyscrasia    platelets low in past  . Chronic back pain   . Chronic constipation   . Chronic sinusitis   . Depression   . Endometriosis   . ENDOMETRIOSIS 05/06/2009   Qualifier: Diagnosis of  By: Varney Daily RN, Butch Penny    . Esophageal stricture   . GERD (gastroesophageal reflux disease)   . Hemorrhoids   . Hiatal hernia   . Hyperlipidemia   . Hypertension   . Irritable bowel syndrome   . Osteoarthritis   . Personal history of diabetes mellitus     controlled with diet only  . Renal cell cancer ([place]) 11/2010 dx   R, s/p cryoablation 02/16/11  . Syncopal episodes    since childhood    Family History  Problem Relation Age of Onset  . Diabetes Mother   . Kidney disease Father   . Prostate cancer Father   . Colitis Brother   . Leukemia Brother   . Multiple sclerosis Sister   . Colon polyps Unknown   . Colon cancer Neg Hx     Past Surgical History:  Procedure Laterality Date  . BREAST SURGERY     bilateral, cyst removal  . fallopian tube removed    . FUNCTIONAL ENDOSCOPIC SINUS SURGERY    . LASER ABLATION OF THE CERVIX    . 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 TOTAL KNEE ARTHROPLASTY Right 10/30/2016   Procedure: RIGHT TOTAL KNEE ARTHROPLASTY;  Surgeon: Meredith Pel, MD;  Location: [facility];  Service: Orthopedics;  Laterality: Right;  . TUBAL LIGATION     Social History   Occupational History  . 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 Smoking status: Never Smoker  . Smokeless tobacco: Never Used  . Alcohol use No     Comment: rare  . Drug use: No  . Sexual activity: No

## 2016-12-14 ENCOUNTER — Ambulatory Visit: Payer: BLUE CROSS/BLUE SHIELD | Admitting: Physical Therapy

## 2016-12-14 ENCOUNTER — Ambulatory Visit (INDEPENDENT_AMBULATORY_CARE_PROVIDER_SITE_OTHER): Payer: BLUE CROSS/BLUE SHIELD | Admitting: Orthopedic Surgery

## 2016-12-14 DIAGNOSIS — M25561 Pain in right knee: Secondary | ICD-10-CM

## 2016-12-14 DIAGNOSIS — Z96651 Presence of right artificial knee joint: Secondary | ICD-10-CM | POA: Diagnosis not present

## 2016-12-14 DIAGNOSIS — M25661 Stiffness of right knee, not elsewhere classified: Secondary | ICD-10-CM | POA: Diagnosis not present

## 2016-12-14 DIAGNOSIS — R600 Localized edema: Secondary | ICD-10-CM

## 2016-12-14 DIAGNOSIS — R262 Difficulty in walking, not elsewhere classified: Secondary | ICD-10-CM | POA: Diagnosis not present

## 2016-12-14 NOTE — Therapy (Signed)
[place], [place], [address] Phone: [phone]   Fax:  [phone]  Physical Therapy Treatment  Patient Details  Name: Janet Le MRN: [id number] Date of Birth: 02/26/1956 Referring Provider: Bonnell Public, MD  Encounter Date: 12/14/2016      PT End of Session - 12/14/16 1043    Visit Number 2   Number of Visits 24   Date for PT Re-Evaluation 03/01/17   Authorization Type BCBS   PT Start Time 1040   PT Stop Time 1135   PT Time Calculation (min) 55 min      Past Medical History:  Diagnosis Date  . Allergic rhinitis, cause unspecified   . Anxiety   . Blood dyscrasia    platelets low in past  . Chronic back pain   . Chronic constipation   . Chronic sinusitis   . Depression   . Endometriosis   . ENDOMETRIOSIS 05/06/2009   Qualifier: Diagnosis of  By: Janet Daily RN, Janet Le    . Esophageal stricture   . GERD (gastroesophageal reflux disease)   . Hemorrhoids   . Hiatal hernia   . Hyperlipidemia   . Hypertension   . Irritable bowel syndrome   . Osteoarthritis   . Personal history of diabetes mellitus    controlled with diet only  . Renal cell cancer ([place]) 11/2010 dx   R, s/p cryoablation 02/16/11  . Syncopal episodes    since childhood    Past Surgical History:  Procedure Laterality Date  . BREAST SURGERY     bilateral, cyst removal  . fallopian tube removed    . FUNCTIONAL ENDOSCOPIC SINUS SURGERY    . LASER ABLATION OF THE CERVIX    . 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 TOTAL KNEE ARTHROPLASTY Right 10/30/2016   Procedure: RIGHT TOTAL KNEE ARTHROPLASTY;  Surgeon: Janet Pel, MD;  Location: [facility];  Service: Orthopedics;  Laterality: Right;  . TUBAL LIGATION      There were no vitals filed for this visit.      Subjective Assessment - 12/14/16 1044    Subjective I have a pain when I walk and a hardness on the side of my knee the MD says it  might be cement.   Currently in Pain? Yes   Pain Score 2    Pain Location Knee   Pain Orientation Right;Anterior;Lateral;Posterior   Pain Descriptors / Indicators Sharp   Aggravating Factors  walking   Pain Relieving Factors getting off feet                          [facility] Adult PT Treatment/Exercise - 12/14/16 0001      Knee/Hip Exercises: Stretches   Gastroc Stretch 2 reps;20 seconds   Gastroc Stretch Limitations runners stretch at walker      Knee/Hip Exercises: Aerobic   Nustep L2 x 7 minutes LE only      Knee/Hip Exercises: Standing   Heel Raises 20 reps   Knee Flexion 15 reps   Forward Step Up 10 reps   Gait Training up and down stairs with bilateral handrails, mild increase in pain after reaching the top of  steps x 2      Knee/Hip Exercises: Seated   Long Arc Quad 20 reps   Heel Slides 10 reps   Other Seated Knee/Hip Exercises quad set x 10, SLR x 10  Sit to [employer] 10 reps  elevated seat     Knee/Hip Exercises: Supine   Quad Sets 10 reps   Short Arc Quad Sets 10 reps   Heel Slides 10 reps   Bridges 10 reps   Straight Leg Raises 10 reps                  PT Short Term Goals - 12/10/16 1336      PT SHORT TERM GOAL #1   Title She will be independent with inital HEP   Time 4   Period Weeks   Status New     PT SHORT TERM GOAL #2   Title She will improve active flexion to 115 degrees   Time 4   Period Weeks   Status New     PT SHORT TERM GOAL #3   Title she will be walking in home without device   Time 4   Period Weeks   Status New     PT SHORT TERM GOAL #4   Title She will report decr pain 50% with activity on feet.    Time 4   Period Weeks   Status New           PT Long Term Goals - 12/10/16 1338      PT LONG TERM GOAL #1   Title She will be independent with all HEP issued   Time 12   Period Weeks   Status New     PT LONG TERM GOAL #2   Title She will report pain as intermittant   Time 12   Period Weeks    Status New     PT LONG TERM GOAL #3   Title She will return to normal home tasks with 1-2 max pain   Time 12   Period Weeks   Status New     PT LONG TERM GOAL #4   Title She will walk without device in and out of home   Time 12   Period Weeks   Status New     PT LONG TERM GOAL #5   Title She will improve hip abduction strength to 4/5 or better to imptrove stability on feet.    Time 12   Period Weeks   Status New     PT LONG TERM GOAL #6   Title FOTO score improved to < 40% limited   Time 12   Period Weeks   Status New               Plan - 12/14/16 1128    Clinical Impression Statement Pt reports ted hose bothersome and she cannot wear them at night depsite MD asking her to. She brought her HHPT HEP book so we reviewed those and added calf stretch and step ups. She does not have stairs to enter her home but she does live in a two level complex where she can use the stairs for her HEP. She was able to climb reciprocal stairs with bilateral HR and mild increase in pain after ascending, no pain with descending.    PT Next Visit Plan stength of hips and knees , gait, flexion stretching   PT Home Exercise Plan HHPT HEP including quad set, LAQ, heel slides, hamstring curs, heel raises, sit-stand, added step up and calf stretch    Consulted and Agree with Plan of Care Patient      Patient will benefit from skilled therapeutic intervention in order to improve the following deficits  and impairments:     Visit Diagnosis: History of total knee arthroplasty, right  Right knee pain, unspecified chronicity  Stiffness of right knee, not elsewhere classified  Difficulty in walking, not elsewhere classified  Localized edema     Problem List Patient Active Problem List   Diagnosis Date Noted  . Ulcer of right lower extremity ([place]) 11/29/2016  . Arthritis of knee 10/30/2016  . S/P knee surgery 10/30/2016  . Burning pain 10/15/2016  . Low back pain 08/01/2016  . Chronic  low back pain 07/18/2016  . Prediabetes 05/30/2016  . Cervicalgia 05/30/2016  . Right knee pain 05/30/2016  . Osteopenia 11/18/2015  . Cephalalgia 07/20/2015  . Thrombocytopenia ([place]) 05/30/2015  . Arthritis of left lower extremity 02/02/2015  . Left knee pain 09/08/2014  . Gout 12/11/2013  . Vision disturbance 12/08/2013  . Severe obesity (BMI >= 40) ([place]) 11/06/2013  . Osteoporosis   . Left lumbar radiculopathy 04/13/2012  . Renal cell cancer ([place])   . External hemorrhoid 11/14/2010  . IBS (irritable bowel syndrome) 11/14/2010  . GERD (gastroesophageal reflux disease) 11/14/2010  . HYPERCHOLESTEROLEMIA, MILD 03/16/2010  . Lymphedema 03/15/2010  . ALLERGIC RHINITIS 08/31/2009  . MIGRAINE HEADACHE 06/14/2009  . SINUSITIS, CHRONIC 06/14/2009  . SYNCOPE 05/16/2009  . Essential hypertension 05/06/2009  . CONSTIPATION, CHRONIC 05/06/2009    Dorene Ar, PTA 12/14/2016, 11:52 AM  [facility] [address] [place], [place], [address] Phone: [phone]   Fax:  [phone]  Name: Janet Le MRN: [id number] Date of Birth: October 20, 1955

## 2016-12-18 DIAGNOSIS — I890 Lymphedema, not elsewhere classified: Secondary | ICD-10-CM | POA: Diagnosis not present

## 2016-12-18 DIAGNOSIS — L97212 Non-pressure chronic ulcer of right calf with fat layer exposed: Secondary | ICD-10-CM | POA: Diagnosis not present

## 2016-12-18 DIAGNOSIS — Z6839 Body mass index (BMI) 39.0-39.9, adult: Secondary | ICD-10-CM | POA: Diagnosis not present

## 2016-12-18 DIAGNOSIS — Z96651 Presence of right artificial knee joint: Secondary | ICD-10-CM | POA: Diagnosis not present

## 2016-12-18 DIAGNOSIS — E119 Type 2 diabetes mellitus without complications: Secondary | ICD-10-CM | POA: Diagnosis not present

## 2016-12-18 DIAGNOSIS — S81801A Unspecified open wound, right lower leg, initial encounter: Secondary | ICD-10-CM | POA: Diagnosis not present

## 2016-12-18 DIAGNOSIS — L97919 Non-pressure chronic ulcer of unspecified part of right lower leg with unspecified severity: Secondary | ICD-10-CM | POA: Diagnosis not present

## 2016-12-18 DIAGNOSIS — I10 Essential (primary) hypertension: Secondary | ICD-10-CM | POA: Diagnosis not present

## 2016-12-20 ENCOUNTER — Ambulatory Visit: Payer: BLUE CROSS/BLUE SHIELD | Admitting: Physical Therapy

## 2016-12-20 DIAGNOSIS — R262 Difficulty in walking, not elsewhere classified: Secondary | ICD-10-CM | POA: Diagnosis not present

## 2016-12-20 DIAGNOSIS — Z96651 Presence of right artificial knee joint: Secondary | ICD-10-CM | POA: Diagnosis not present

## 2016-12-20 DIAGNOSIS — M25661 Stiffness of right knee, not elsewhere classified: Secondary | ICD-10-CM | POA: Diagnosis not present

## 2016-12-20 DIAGNOSIS — M25561 Pain in right knee: Secondary | ICD-10-CM | POA: Diagnosis not present

## 2016-12-20 DIAGNOSIS — R600 Localized edema: Secondary | ICD-10-CM | POA: Diagnosis not present

## 2016-12-20 NOTE — Therapy (Signed)
[facility] [place], [place], [address] Phone: [phone]   Fax:  [phone]  Physical Therapy Treatment  Patient Details  Name: Janet Le MRN: [id number] Date of Birth: October 19, 1955 Referring Provider: Bonnell Public, MD  Encounter Date: 12/20/2016      PT End of Session - 12/20/16 1130    Visit Number 3   Number of Visits 24   Date for PT Re-Evaluation 03/01/17   Authorization Type BCBS   PT Start Time 1125   PT Stop Time 1210   PT Time Calculation (min) 45 min      Past Medical History:  Diagnosis Date  . Allergic rhinitis, cause unspecified   . Anxiety   . Blood dyscrasia    platelets low in past  . Chronic back pain   . Chronic constipation   . Chronic sinusitis   . Depression   . Endometriosis   . ENDOMETRIOSIS 05/06/2009   Qualifier: Diagnosis of  By: Varney Daily RN, Butch Penny    . Esophageal stricture   . GERD (gastroesophageal reflux disease)   . Hemorrhoids   . Hiatal hernia   . Hyperlipidemia   . Hypertension   . Irritable bowel syndrome   . Osteoarthritis   . Personal history of diabetes mellitus    controlled with diet only  . Renal cell cancer ([place]) 11/2010 dx   R, s/p cryoablation 02/16/11  . Syncopal episodes    since childhood    Past Surgical History:  Procedure Laterality Date  . BREAST SURGERY     bilateral, cyst removal  . fallopian tube removed    . FUNCTIONAL ENDOSCOPIC SINUS SURGERY    . LASER ABLATION OF THE CERVIX    . 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 TOTAL KNEE ARTHROPLASTY Right 10/30/2016   Procedure: RIGHT TOTAL KNEE ARTHROPLASTY;  Surgeon: Meredith Pel, MD;  Location: [facility];  Service: Orthopedics;  Laterality: Right;  . TUBAL LIGATION      There were no vitals filed for this visit.      Subjective Assessment - 12/20/16 1128    Subjective I still have pain when I walk and I feel soreness when pressure is placed on the  knee when I get in the bed.    Currently in Pain? No/denies            [facility] PT Assessment - 12/20/16 0001      AROM   Right Knee Flexion 105                     [facility] Adult PT Treatment/Exercise - 12/20/16 0001      Ambulation/Gait   Ambulation/Gait Yes   Ambulation/Gait Assistance 6: Modified independent (Device/Increase time)   Ambulation Distance (Feet) 100 Feet   Assistive device Straight cane   Gait Pattern Step-through pattern   Ambulation Surface Indoor   Gait Comments cues for sequencing with SPC      Knee/Hip Exercises: Stretches   Gastroc Stretch 2 reps;20 seconds   Gastroc Stretch Limitations runners stretch at walker      Knee/Hip Exercises: Aerobic   Nustep L5 x 7 minutes LE only      Knee/Hip Exercises: Standing   Knee Flexion 2 sets;10 reps   Knee Flexion Limitations 3#   Hip Flexion 10 reps;2 sets   Hip Flexion Limitations 3#   Lateral Step Up 10 reps;Step Height: 6";Step Height: 4";Hand  Hold: 1;2 sets   Forward Step Up 10 reps;Step Height: 6";Hand Hold: 1   SLS 6 sec best    Gait Training Gait training with SPC, cues for sequencing required      Knee/Hip Exercises: Seated   Long Arc Quad 20 reps   Long Arc Quad Weight 3 lbs.     Knee/Hip Exercises: Supine   Quad Sets 15 reps   Quad Sets Limitations into towel roll   Short Arc Quad Sets 10 reps;2 sets   Short Arc Quad Sets Limitations 3#   Bridges 10 reps;2 sets   Straight Leg Raises 10 reps;2 sets     Modalities   Modalities Cryotherapy     Cryotherapy   Number Minutes Cryotherapy 10 Minutes   Cryotherapy Location Knee   Type of Cryotherapy Ice pack                  PT Short Term Goals - 12/10/16 1336      PT SHORT TERM GOAL #1   Title She will be independent with inital HEP   Time 4   Period Weeks   Status New     PT SHORT TERM GOAL #2   Title She will improve active flexion to 115 degrees   Time 4   Period Weeks   Status New     PT SHORT TERM GOAL  #3   Title she will be walking in home without device   Time 4   Period Weeks   Status New     PT SHORT TERM GOAL #4   Title She will report decr pain 50% with activity on feet.    Time 4   Period Weeks   Status New           PT Long Term Goals - 12/10/16 1338      PT LONG TERM GOAL #1   Title She will be independent with all HEP issued   Time 12   Period Weeks   Status New     PT LONG TERM GOAL #2   Title She will report pain as intermittant   Time 12   Period Weeks   Status New     PT LONG TERM GOAL #3   Title She will return to normal home tasks with 1-2 max pain   Time 12   Period Weeks   Status New     PT LONG TERM GOAL #4   Title She will walk without device in and out of home   Time 12   Period Weeks   Status New     PT LONG TERM GOAL #5   Title She will improve hip abduction strength to 4/5 or better to imptrove stability on feet.    Time 12   Period Weeks   Status New     PT LONG TERM GOAL #6   Title FOTO score improved to < 40% limited   Time 12   Period Weeks   Status New               Plan - 12/20/16 1335    Clinical Impression Statement Pt reports doing initial HEP, forgot to do the new ones. We reviewed step ups and calf stretch to add to HEP. She reports wound on her calf is improving with wound care. She is 9 weeks S/P TKA and continues to ambulate with RW inside her home. She reports she is fearful of falling due to her knee  hyperextending during gait. Trial of SPC with pt requiring cues for sequencing. I asked her to practice with [place] only indoors for now.    PT Next Visit Plan stength of hips and knees , gait, flexion stretching, balance, BERG?   PT Home Exercise Plan HHPT HEP including quad set, LAQ, heel slides, hamstring curs, heel raises, sit-stand, added step up and calf stretch    Consulted and Agree with Plan of Care Patient      Patient will benefit from skilled therapeutic intervention in order to improve the  following deficits and impairments:  Pain, Decreased activity tolerance, Decreased strength, Increased edema, Decreased range of motion, Difficulty walking, Increased muscle spasms  Visit Diagnosis: History of total knee arthroplasty, right  Right knee pain, unspecified chronicity  Stiffness of right knee, not elsewhere classified  Difficulty in walking, not elsewhere classified  Localized edema     Problem List Patient Active Problem List   Diagnosis Date Noted  . Ulcer of right lower extremity ([place]) 11/29/2016  . Arthritis of knee 10/30/2016  . S/P knee surgery 10/30/2016  . Burning pain 10/15/2016  . Low back pain 08/01/2016  . Chronic low back pain 07/18/2016  . Prediabetes 05/30/2016  . Cervicalgia 05/30/2016  . Right knee pain 05/30/2016  . Osteopenia 11/18/2015  . Cephalalgia 07/20/2015  . Thrombocytopenia ([place]) 05/30/2015  . Arthritis of left lower extremity 02/02/2015  . Left knee pain 09/08/2014  . Gout 12/11/2013  . Vision disturbance 12/08/2013  . Severe obesity (BMI >= 40) ([place]) 11/06/2013  . Osteoporosis   . Left lumbar radiculopathy 04/13/2012  . Renal cell cancer ([place])   . External hemorrhoid 11/14/2010  . IBS (irritable bowel syndrome) 11/14/2010  . GERD (gastroesophageal reflux disease) 11/14/2010  . HYPERCHOLESTEROLEMIA, MILD 03/16/2010  . Lymphedema 03/15/2010  . ALLERGIC RHINITIS 08/31/2009  . MIGRAINE HEADACHE 06/14/2009  . SINUSITIS, CHRONIC 06/14/2009  . SYNCOPE 05/16/2009  . Essential hypertension 05/06/2009  . CONSTIPATION, CHRONIC 05/06/2009    Dorene Ar , PTA 12/20/2016, 1:46 PM  [facility] [place], [place], [address] Phone: [phone]   Fax:  [phone]  Name: LARAINE SAMET MRN: [id number] Date of Birth: 1956-04-03

## 2016-12-21 ENCOUNTER — Ambulatory Visit: Payer: BLUE CROSS/BLUE SHIELD | Admitting: Physical Therapy

## 2016-12-21 DIAGNOSIS — M25561 Pain in right knee: Secondary | ICD-10-CM

## 2016-12-21 DIAGNOSIS — M25661 Stiffness of right knee, not elsewhere classified: Secondary | ICD-10-CM | POA: Diagnosis not present

## 2016-12-21 DIAGNOSIS — R600 Localized edema: Secondary | ICD-10-CM | POA: Diagnosis not present

## 2016-12-21 DIAGNOSIS — R262 Difficulty in walking, not elsewhere classified: Secondary | ICD-10-CM | POA: Diagnosis not present

## 2016-12-21 DIAGNOSIS — Z96651 Presence of right artificial knee joint: Secondary | ICD-10-CM

## 2016-12-21 NOTE — Therapy (Signed)
[facility] [place], [place], [address] Phone: [phone]   Fax:  [phone]  Physical Therapy Treatment  Patient Details  Name: Janet Le MRN: [id number] Date of Birth: 1956/04/15 Referring Provider: Bonnell Public, MD  Encounter Date: 12/21/2016      PT End of Session - 12/21/16 1050    Visit Number 4   Number of Visits 24   Date for PT Re-Evaluation 03/01/17   Authorization Type BCBS   PT Start Time 1047   PT Stop Time 1140   PT Time Calculation (min) 53 min      Past Medical History:  Diagnosis Date  . Allergic rhinitis, cause unspecified   . Anxiety   . Blood dyscrasia    platelets low in past  . Chronic back pain   . Chronic constipation   . Chronic sinusitis   . Depression   . Endometriosis   . ENDOMETRIOSIS 05/06/2009   Qualifier: Diagnosis of  By: Varney Daily RN, Janet Le    . Esophageal stricture   . GERD (gastroesophageal reflux disease)   . Hemorrhoids   . Hiatal hernia   . Hyperlipidemia   . Hypertension   . Irritable bowel syndrome   . Osteoarthritis   . Personal history of diabetes mellitus    controlled with diet only  . Renal cell cancer ([place]) 11/2010 dx   R, s/p cryoablation 02/16/11  . Syncopal episodes    since childhood    Past Surgical History:  Procedure Laterality Date  . BREAST SURGERY     bilateral, cyst removal  . fallopian tube removed    . FUNCTIONAL ENDOSCOPIC SINUS SURGERY    . LASER ABLATION OF THE CERVIX    . 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 TOTAL KNEE ARTHROPLASTY Right 10/30/2016   Procedure: RIGHT TOTAL KNEE ARTHROPLASTY;  Surgeon: Meredith Pel, MD;  Location: [facility];  Service: Orthopedics;  Laterality: Right;  . TUBAL LIGATION      There were no vitals filed for this visit.      Subjective Assessment - 12/21/16 1052    Subjective I was worn out after last time.    Currently in Pain? Yes   Pain Score 2    Pain Location Knee   Pain Orientation Right;Anterior;Lateral   Pain Descriptors / Indicators Aching   Aggravating Factors  walking   Pain Relieving Factors getting off feet                          [facility] Adult PT Treatment/Exercise - 12/21/16 0001      Knee/Hip Exercises: Stretches   Gastroc Stretch Limitations slant board x 1 minute      Knee/Hip Exercises: Aerobic   Nustep L5 x 8 minutes LE only      Knee/Hip Exercises: Standing   Knee Flexion 2 sets;10 reps   Knee Flexion Limitations 3#   Hip Flexion 10 reps;2 sets   Hip Flexion Limitations 3#   Lateral Step Up 10 reps;Step Height: 4";Hand Hold: 1   Forward Step Up 10 reps;Step Height: 6";Hand Hold: 1   SLS 10 sec best    Gait Training gait training with SPC, better sequencing today, good safety     Knee/Hip Exercises: Seated   Long Arc Quad 20 reps   Long Arc Quad Weight 3 lbs.   Sit to [employer] 10 reps  Knee/Hip Exercises: Supine   Quad Sets 15 reps   Quad Sets Limitations into towel roll   Short Arc Quad Sets 10 reps;2 sets   Short Arc Quad Sets Limitations 3#   Bridges 10 reps;2 sets   Straight Leg Raises 10 reps;2 sets     Modalities   Modalities Vasopneumatic     Vasopneumatic   Number Minutes Vasopneumatic  15 minutes   Vasopnuematic Location  Knee   Vasopneumatic Pressure Low   Vasopneumatic Temperature  34                  PT Short Term Goals - 12/10/16 1336      PT SHORT TERM GOAL #1   Title She will be independent with inital HEP   Time 4   Period Weeks   Status New     PT SHORT TERM GOAL #2   Title She will improve active flexion to 115 degrees   Time 4   Period Weeks   Status New     PT SHORT TERM GOAL #3   Title she will be walking in home without device   Time 4   Period Weeks   Status New     PT SHORT TERM GOAL #4   Title She will report decr pain 50% with activity on feet.    Time 4   Period Weeks   Status New           PT Long Term Goals -  12/10/16 1338      PT LONG TERM GOAL #1   Title She will be independent with all HEP issued   Time 12   Period Weeks   Status New     PT LONG TERM GOAL #2   Title She will report pain as intermittant   Time 12   Period Weeks   Status New     PT LONG TERM GOAL #3   Title She will return to normal home tasks with 1-2 max pain   Time 12   Period Weeks   Status New     PT LONG TERM GOAL #4   Title She will walk without device in and out of home   Time 12   Period Weeks   Status New     PT LONG TERM GOAL #5   Title She will improve hip abduction strength to 4/5 or better to imptrove stability on feet.    Time 12   Period Weeks   Status New     PT LONG TERM GOAL #6   Title FOTO score improved to < 40% limited   Time 12   Period Weeks   Status New               Plan - 12/21/16 1053    Clinical Impression Statement She did not try SPC at home. Reviewed gait mechanics with SPC and encouraged her to try again over the weekend indoors. Continued gentle strengthening. Trial of vasopneumatic device to reduce edema.   PT Next Visit Plan stength of hips and knees , gait, flexion stretching, balance, BERG? gait with SPC , check STGs    PT Home Exercise Plan HHPT HEP including quad set, LAQ, heel slides, hamstring curs, heel raises, sit-stand, added step up and calf stretch    Consulted and Agree with Plan of Care Patient      Patient will benefit from skilled therapeutic intervention in order to improve the following deficits and impairments:  Pain, Decreased activity tolerance, Decreased strength, Increased edema, Decreased range of motion, Difficulty walking, Increased muscle spasms  Visit Diagnosis: History of total knee arthroplasty, right  Right knee pain, unspecified chronicity  Stiffness of right knee, not elsewhere classified  Difficulty in walking, not elsewhere classified  Localized edema     Problem List Patient Active Problem List   Diagnosis Date  Noted  . Ulcer of right lower extremity ([place]) 11/29/2016  . Arthritis of knee 10/30/2016  . S/P knee surgery 10/30/2016  . Burning pain 10/15/2016  . Low back pain 08/01/2016  . Chronic low back pain 07/18/2016  . Prediabetes 05/30/2016  . Cervicalgia 05/30/2016  . Right knee pain 05/30/2016  . Osteopenia 11/18/2015  . Cephalalgia 07/20/2015  . Thrombocytopenia ([facility]) 05/30/2015  . Arthritis of left lower extremity 02/02/2015  . Left knee pain 09/08/2014  . Gout 12/11/2013  . Vision disturbance 12/08/2013  . Severe obesity (BMI >= 40) ([place]) 11/06/2013  . Osteoporosis   . Left lumbar radiculopathy 04/13/2012  . Renal cell cancer ([place])   . External hemorrhoid 11/14/2010  . IBS (irritable bowel syndrome) 11/14/2010  . GERD (gastroesophageal reflux disease) 11/14/2010  . HYPERCHOLESTEROLEMIA, MILD 03/16/2010  . Lymphedema 03/15/2010  . ALLERGIC RHINITIS 08/31/2009  . MIGRAINE HEADACHE 06/14/2009  . SINUSITIS, CHRONIC 06/14/2009  . SYNCOPE 05/16/2009  . Essential hypertension 05/06/2009  . CONSTIPATION, CHRONIC 05/06/2009    Dorene Ar, PTA 12/21/2016, 11:35 AM  [facility] [address] [place], [place], [address] Phone: [phone]   Fax:  [phone]  Name: Janet Le MRN: [id number] Date of Birth: 1956-05-09

## 2016-12-24 ENCOUNTER — Ambulatory Visit: Payer: BLUE CROSS/BLUE SHIELD | Attending: Internal Medicine

## 2016-12-24 DIAGNOSIS — Z96651 Presence of right artificial knee joint: Secondary | ICD-10-CM | POA: Insufficient documentation

## 2016-12-24 DIAGNOSIS — R600 Localized edema: Secondary | ICD-10-CM | POA: Insufficient documentation

## 2016-12-24 DIAGNOSIS — R262 Difficulty in walking, not elsewhere classified: Secondary | ICD-10-CM | POA: Insufficient documentation

## 2016-12-24 DIAGNOSIS — M25661 Stiffness of right knee, not elsewhere classified: Secondary | ICD-10-CM | POA: Insufficient documentation

## 2016-12-24 DIAGNOSIS — M25561 Pain in right knee: Secondary | ICD-10-CM | POA: Diagnosis not present

## 2016-12-24 NOTE — Therapy (Signed)
[facility], [place], [address] Phone: [phone]   Fax:  [phone]  Physical Therapy Treatment  Patient Details  Name: Janet Le MRN: [id number] Date of Birth: 19-Oct-1955 Referring Provider: Bonnell Public, MD  Encounter Date: 12/24/2016      PT End of Session - 12/24/16 1113    Visit Number 5   Number of Visits 24   Date for PT Re-Evaluation 03/01/17   Authorization Type BCBS   PT Start Time 1115  She ws taken early as she was here early and treated intermittantly during this time   PT Stop Time 1230   PT Time Calculation (min) 75 min   Activity Tolerance Patient tolerated treatment well;No increased pain   Behavior During Therapy WFL for tasks assessed/performed      Past Medical History:  Diagnosis Date  . Allergic rhinitis, cause unspecified   . Anxiety   . Blood dyscrasia    platelets low in past  . Chronic back pain   . Chronic constipation   . Chronic sinusitis   . Depression   . Endometriosis   . ENDOMETRIOSIS 05/06/2009   Qualifier: Diagnosis of  By: Varney Daily RN, Butch Penny    . Esophageal stricture   . GERD (gastroesophageal reflux disease)   . Hemorrhoids   . Hiatal hernia   . Hyperlipidemia   . Hypertension   . Irritable bowel syndrome   . Osteoarthritis   . Personal history of diabetes mellitus    controlled with diet only  . Renal cell cancer ([place]) 11/2010 dx   R, s/p cryoablation 02/16/11  . Syncopal episodes    since childhood    Past Surgical History:  Procedure Laterality Date  . BREAST SURGERY     bilateral, cyst removal  . fallopian tube removed    . FUNCTIONAL ENDOSCOPIC SINUS SURGERY    . LASER ABLATION OF THE CERVIX    . 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 TOTAL KNEE ARTHROPLASTY Right 10/30/2016   Procedure: RIGHT TOTAL KNEE ARTHROPLASTY;  Surgeon: Meredith Pel, MD;  Location: [facility];  Service: Orthopedics;  Laterality:  Right;  . TUBAL LIGATION      There were no vitals filed for this visit.      Subjective Assessment - 12/24/16 1120    Subjective HAving pain today. questions about pain and tightness along incision  ( I told her this was normal with TKA and should improve over time)   Currently in Pain? Yes   Pain Score 4    Pain Location Knee   Pain Orientation Right;Anterior;Lateral   Pain Descriptors / Indicators Aching   Pain Type Surgical pain   Pain Onset More than a month ago   Pain Frequency Constant   Aggravating Factors  walking , stretching   Pain Relieving Factors rest off feet            [facility] PT Assessment - 12/24/16 0001      AROM   Right Knee Extension 0   Right Knee Flexion 105                     [facility] Adult PT Treatment/Exercise - 12/24/16 0001      Ambulation/Gait   Ambulation/Gait Assistance 4: Min guard  close due to first time walk without device in clinic   Ambulation Distance (Feet) 300 Feet   Gait Pattern Step-through pattern  Ambulation Surface Level;Indoor   Gait Comments No LOB , no teettering          Knee/Hip Exercises: Aerobic   Nustep L5 x 10 minutes LE only      Knee/Hip Exercises: Standing   Gait Training gait with no device close CG     Knee/Hip Exercises: Seated   Long Arc Quad 20 reps   Long Arc Quad Weight 3 lbs.     Knee/Hip Exercises: Supine   Quad Sets 15 reps   Short Arc Quad Sets Right;20 reps   Short Arc [employer] Limitations 5 pounds   Bridges 20 reps   Bridges Limitations both legs on large bolster   Straight Leg Raises Right;15 reps     Knee/Hip Exercises: Sidelying   Hip ABduction Right;20 reps   Hip ABduction Limitations cued for hip /body alighnment     Knee/Hip Exercises: Prone   Hamstring Curl 20 reps                PT Education - 12/24/16 1304    Education provided Yes   Education Details Resdy to practice at home without device with something close to touch hold to if needed    Person(s) Educated Patient   Methods Explanation   Comprehension Verbalized understanding;Returned demonstration          PT Short Term Goals - 12/24/16 1141      PT SHORT TERM GOAL #1   Title She will be independent with inital HEP   Status Achieved     PT SHORT TERM GOAL #2   Title She will improve active flexion to 115 degrees   Baseline 105 degrees today   Status On-going     PT SHORT TERM GOAL #3   Title she will be walking in home without device   Status On-going     PT SHORT TERM GOAL #4   Title She will report decr pain 50% with activity on feet.    Status On-going           PT Long Term Goals - 12/10/16 1338      PT LONG TERM GOAL #1   Title She will be independent with all HEP issued   Time 12   Period Weeks   Status New     PT LONG TERM GOAL #2   Title She will report pain as intermittant   Time 12   Period Weeks   Status New     PT LONG TERM GOAL #3   Title She will return to normal home tasks with 1-2 max pain   Time 12   Period Weeks   Status New     PT LONG TERM GOAL #4   Title She will walk without device in and out of home   Time 12   Period Weeks   Status New     PT LONG TERM GOAL #5   Title She will improve hip abduction strength to 4/5 or better to imptrove stability on feet.    Time 12   Period Weeks   Status New     PT LONG TERM GOAL #6   Title FOTO score improved to < 40% limited   Time 12   Period Weeks   Status New               Plan - 12/24/16 1139    Clinical Impression Statement Pain an issue but she is really doing well with Good range and  muscle contraction.  She appears  in good spirits and is will to do all activity   PT Treatment/Interventions Cryotherapy;Gait training;Stair training;Passive range of motion;Patient/family education;Therapeutic exercise;Balance training;Therapeutic activities;Manual techniques;Taping   PT Next Visit Plan stength of hips and knees , gait, flexion stretching, balance,  BERG? gait with SPC , check STGs    PT Home Exercise Plan HHPT HEP including quad set, LAQ, heel slides, hamstring curs, heel raises, sit-stand, added step up and calf stretch    Consulted and Agree with Plan of Care Patient      Patient will benefit from skilled therapeutic intervention in order to improve the following deficits and impairments:  Pain, Decreased activity tolerance, Decreased strength, Increased edema, Decreased range of motion, Difficulty walking, Increased muscle spasms  Visit Diagnosis: History of total knee arthroplasty, right  Right knee pain, unspecified chronicity  Stiffness of right knee, not elsewhere classified  Difficulty in walking, not elsewhere classified  Localized edema     Problem List Patient Active Problem List   Diagnosis Date Noted  . Ulcer of right lower extremity ([place]) 11/29/2016  . Arthritis of knee 10/30/2016  . S/P knee surgery 10/30/2016  . Burning pain 10/15/2016  . Low back pain 08/01/2016  . Chronic low back pain 07/18/2016  . Prediabetes 05/30/2016  . Cervicalgia 05/30/2016  . Right knee pain 05/30/2016  . Osteopenia 11/18/2015  . Cephalalgia 07/20/2015  . Thrombocytopenia ([place]) 05/30/2015  . Arthritis of left lower extremity 02/02/2015  . Left knee pain 09/08/2014  . Gout 12/11/2013  . Vision disturbance 12/08/2013  . Severe obesity (BMI >= 40) ([place]) 11/06/2013  . Osteoporosis   . Left lumbar radiculopathy 04/13/2012  . Renal cell cancer ([place])   . External hemorrhoid 11/14/2010  . IBS (irritable bowel syndrome) 11/14/2010  . GERD (gastroesophageal reflux disease) 11/14/2010  . HYPERCHOLESTEROLEMIA, MILD 03/16/2010  . Lymphedema 03/15/2010  . ALLERGIC RHINITIS 08/31/2009  . MIGRAINE HEADACHE 06/14/2009  . SINUSITIS, CHRONIC 06/14/2009  . SYNCOPE 05/16/2009  . Essential hypertension 05/06/2009  . CONSTIPATION, CHRONIC 05/06/2009    Darrel Hoover  PT 12/24/2016, 1:05 PM  [facility] [address] [place], [place], [address] Phone: [phone]   Fax:  [phone]  Name: CLARRISA KAYLOR MRN: [id number] Date of Birth: 12-12-1955

## 2016-12-25 ENCOUNTER — Other Ambulatory Visit: Payer: Self-pay | Admitting: Hematology

## 2016-12-25 DIAGNOSIS — D696 Thrombocytopenia, unspecified: Secondary | ICD-10-CM

## 2016-12-27 ENCOUNTER — Ambulatory Visit: Payer: BLUE CROSS/BLUE SHIELD

## 2016-12-27 DIAGNOSIS — M25661 Stiffness of right knee, not elsewhere classified: Secondary | ICD-10-CM

## 2016-12-27 DIAGNOSIS — M25561 Pain in right knee: Secondary | ICD-10-CM

## 2016-12-27 DIAGNOSIS — R262 Difficulty in walking, not elsewhere classified: Secondary | ICD-10-CM | POA: Diagnosis not present

## 2016-12-27 DIAGNOSIS — Z96651 Presence of right artificial knee joint: Secondary | ICD-10-CM | POA: Diagnosis not present

## 2016-12-27 DIAGNOSIS — R600 Localized edema: Secondary | ICD-10-CM

## 2016-12-27 NOTE — Therapy (Signed)
[place], [place], [address] Phone: [phone]   Fax:  [phone]  Physical Therapy Treatment  Patient Details  Name: Janet Le MRN: [id number] Date of Birth: May 23, 1956 Referring Provider: Bonnell Public, MD  Encounter Date: 12/27/2016      PT End of Session - 12/27/16 1326    Visit Number 6   Number of Visits 24   Date for PT Re-Evaluation 03/01/17   Authorization Type BCBS   PT Start Time 0125   PT Stop Time 0230   PT Time Calculation (min) 65 min   Activity Tolerance Patient tolerated treatment well;No increased pain   Behavior During Therapy WFL for tasks assessed/performed      Past Medical History:  Diagnosis Date  . Allergic rhinitis, cause unspecified   . Anxiety   . Blood dyscrasia    platelets low in past  . Chronic back pain   . Chronic constipation   . Chronic sinusitis   . Depression   . Endometriosis   . ENDOMETRIOSIS 05/06/2009   Qualifier: Diagnosis of  By: Varney Daily RN, Butch Penny    . Esophageal stricture   . GERD (gastroesophageal reflux disease)   . Hemorrhoids   . Hiatal hernia   . Hyperlipidemia   . Hypertension   . Irritable bowel syndrome   . Osteoarthritis   . Personal history of diabetes mellitus    controlled with diet only  . Renal cell cancer ([place]) 11/2010 dx   R, s/p cryoablation 02/16/11  . Syncopal episodes    since childhood    Past Surgical History:  Procedure Laterality Date  . BREAST SURGERY     bilateral, cyst removal  . fallopian tube removed    . FUNCTIONAL ENDOSCOPIC SINUS SURGERY    . LASER ABLATION OF THE CERVIX    . 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 TOTAL KNEE ARTHROPLASTY Right 10/30/2016   Procedure: RIGHT TOTAL KNEE ARTHROPLASTY;  Surgeon: Meredith Pel, MD;  Location: [facility];  Service: Orthopedics;  Laterality: Right;  . TUBAL LIGATION      There were no vitals filed for this visit.       Subjective Assessment - 12/27/16 1328    Subjective 2/10 pain   Currently in Pain? Yes   Pain Score 2    Pain Location Knee   Pain Orientation Right;Anterior;Lateral   Pain Descriptors / Indicators Aching   Pain Type Surgical pain   Pain Onset More than a month ago   Pain Frequency Constant   Aggravating Factors  walk /stretching   Pain Relieving Factors rest            [facility] PT Assessment - 12/27/16 0001      AROM   Right Knee Extension 0   Right Knee Flexion 113                     [facility] Adult PT Treatment/Exercise - 12/27/16 0001      Ambulation/Gait   Assistive device None   Stairs Yes   Stairs Assistance 6: Modified independent (Device/Increase time)   Stair Management Technique Alternating pattern   Number of Stairs 16   Height of Stairs 6   Gait Comments No LOB , no teettering          Knee/Hip Exercises: Aerobic   Nustep L5 x 10 minutes LE only      Knee/Hip Exercises:  Standing   Knee Flexion Limitations 5 pounds 10 sets of 10 reps    Wall Squat 15 reps;3 seconds   Other Standing Knee Exercises Stand with weight shift RT  with reach      Knee/Hip Exercises: Seated   Long Arc Quad Weight 5 lbs.   Long [employer] Limitations 10 sets of 10 reps 5 sec hold     Knee/Hip Exercises: Supine   Quad Sets 15 reps   Bridges 20 reps   Straight Leg Raises Right;15 reps                PT Education - 12/27/16 1452    Education provided Yes   Education Details Discussed progress and that she is doing very well   Person(s) Educated Patient   Methods Explanation   Comprehension Verbalized understanding          PT Short Term Goals - 12/27/16 1454      PT SHORT TERM GOAL #1   Title She will be independent with inital HEP   Status Achieved     PT SHORT TERM GOAL #2   Title She will improve active flexion to 115 degrees   Baseline 113 today   Status Achieved     PT SHORT TERM GOAL #3   Title she will be walking in home without  device   Status On-going     PT SHORT TERM GOAL #4   Title She will report decr pain 50% with activity on feet.    Status On-going           PT Long Term Goals - 12/10/16 1338      PT LONG TERM GOAL #1   Title She will be independent with all HEP issued   Time 12   Period Weeks   Status New     PT LONG TERM GOAL #2   Title She will report pain as intermittant   Time 12   Period Weeks   Status New     PT LONG TERM GOAL #3   Title She will return to normal home tasks with 1-2 max pain   Time 12   Period Weeks   Status New     PT LONG TERM GOAL #4   Title She will walk without device in and out of home   Time 12   Period Weeks   Status New     PT LONG TERM GOAL #5   Title She will improve hip abduction strength to 4/5 or better to imptrove stability on feet.    Time 12   Period Weeks   Status New     PT LONG TERM GOAL #6   Title FOTO score improved to < 40% limited   Time 12   Period Weeks   Status New               Plan - 12/27/16 1325    Clinical Impression Statement Continues to do well with ROm , stairs and wlaking without device. She should not do this at home for now but steady today.  Progressing with goals   PT Treatment/Interventions Cryotherapy;Gait training;Stair training;Passive range of motion;Patient/family education;Therapeutic exercise;Balance training;Therapeutic activities;Manual techniques;Taping   PT Next Visit Plan strength of hips and knees , gait, flexion stretching, balance, BERG? gait with SPC , check STGs    PT Home Exercise Plan HHPT HEP including quad set, LAQ, heel slides, hamstring curs, heel raises, sit-stand, added step up and calf stretch  Consulted and Agree with Plan of Care Patient      Patient will benefit from skilled therapeutic intervention in order to improve the following deficits and impairments:  Pain, Decreased activity tolerance, Decreased strength, Increased edema, Decreased range of motion, Difficulty  walking, Increased muscle spasms  Visit Diagnosis: History of total knee arthroplasty, right  Right knee pain, unspecified chronicity  Stiffness of right knee, not elsewhere classified  Difficulty in walking, not elsewhere classified  Localized edema     Problem List Patient Active Problem List   Diagnosis Date Noted  . Ulcer of right lower extremity ([place]) 11/29/2016  . Arthritis of knee 10/30/2016  . S/P knee surgery 10/30/2016  . Burning pain 10/15/2016  . Low back pain 08/01/2016  . Chronic low back pain 07/18/2016  . Prediabetes 05/30/2016  . Cervicalgia 05/30/2016  . Right knee pain 05/30/2016  . Osteopenia 11/18/2015  . Cephalalgia 07/20/2015  . Thrombocytopenia ([place]) 05/30/2015  . Arthritis of left lower extremity 02/02/2015  . Left knee pain 09/08/2014  . Gout 12/11/2013  . Vision disturbance 12/08/2013  . Severe obesity (BMI >= 40) ([place]) 11/06/2013  . Osteoporosis   . Left lumbar radiculopathy 04/13/2012  . Renal cell cancer ([place])   . External hemorrhoid 11/14/2010  . IBS (irritable bowel syndrome) 11/14/2010  . GERD (gastroesophageal reflux disease) 11/14/2010  . HYPERCHOLESTEROLEMIA, MILD 03/16/2010  . Lymphedema 03/15/2010  . ALLERGIC RHINITIS 08/31/2009  . MIGRAINE HEADACHE 06/14/2009  . SINUSITIS, CHRONIC 06/14/2009  . SYNCOPE 05/16/2009  . Essential hypertension 05/06/2009  . CONSTIPATION, CHRONIC 05/06/2009    Darrel Hoover  PT 12/27/2016, 2:56 PM  [facility] [address] [place], [place], [address] Phone: [phone]   Fax:  [phone]  Name: BRAYDEE SHIMKUS MRN: [id number] Date of Birth: 20-Nov-1955

## 2016-12-31 ENCOUNTER — Other Ambulatory Visit: Payer: Self-pay | Admitting: Hematology

## 2016-12-31 ENCOUNTER — Ambulatory Visit: Payer: BLUE CROSS/BLUE SHIELD

## 2016-12-31 DIAGNOSIS — R600 Localized edema: Secondary | ICD-10-CM | POA: Diagnosis not present

## 2016-12-31 DIAGNOSIS — D696 Thrombocytopenia, unspecified: Secondary | ICD-10-CM

## 2016-12-31 DIAGNOSIS — Z96651 Presence of right artificial knee joint: Secondary | ICD-10-CM

## 2016-12-31 DIAGNOSIS — M25561 Pain in right knee: Secondary | ICD-10-CM | POA: Diagnosis not present

## 2016-12-31 DIAGNOSIS — M25661 Stiffness of right knee, not elsewhere classified: Secondary | ICD-10-CM | POA: Diagnosis not present

## 2016-12-31 DIAGNOSIS — R262 Difficulty in walking, not elsewhere classified: Secondary | ICD-10-CM

## 2016-12-31 MED ORDER — PNV PRENATAL PLUS MULTIVITAMIN 27-1 MG PO TABS: ORAL_TABLET | ORAL | 3 refills | Status: DC

## 2016-12-31 NOTE — Therapy (Signed)
[place], [place], [address] Phone: [phone]   Fax:  [phone]  Physical Therapy Treatment  Patient Details  Name: Janet Le MRN: [id number] Date of Birth: 27-Jan-1956 Referring Provider: Bonnell Public, MD  Encounter Date: 12/31/2016      PT End of Session - 12/31/16 1232    Visit Number 7   Number of Visits 24   Date for PT Re-Evaluation 03/01/17   Authorization Type BCBS   PT Start Time 1215   PT Stop Time 1320   PT Time Calculation (min) 65 min   Activity Tolerance Patient tolerated treatment well   Behavior During Therapy [facility] for tasks assessed/performed      Past Medical History:  Diagnosis Date  . Allergic rhinitis, cause unspecified   . Anxiety   . Blood dyscrasia    platelets low in past  . Chronic back pain   . Chronic constipation   . Chronic sinusitis   . Depression   . Endometriosis   . ENDOMETRIOSIS 05/06/2009   Qualifier: Diagnosis of  By: Varney Daily RN, Butch Penny    . Esophageal stricture   . GERD (gastroesophageal reflux disease)   . Hemorrhoids   . Hiatal hernia   . Hyperlipidemia   . Hypertension   . Irritable bowel syndrome   . Osteoarthritis   . Personal history of diabetes mellitus    controlled with diet only  . Renal cell cancer ([place]) 11/2010 dx   R, s/p cryoablation 02/16/11  . Syncopal episodes    since childhood    Past Surgical History:  Procedure Laterality Date  . BREAST SURGERY     bilateral, cyst removal  . fallopian tube removed    . FUNCTIONAL ENDOSCOPIC SINUS SURGERY    . LASER ABLATION OF THE CERVIX    . 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 TOTAL KNEE ARTHROPLASTY Right 10/30/2016   Procedure: RIGHT TOTAL KNEE ARTHROPLASTY;  Surgeon: Meredith Pel, MD;  Location: [facility];  Service: Orthopedics;  Laterality: Right;  . TUBAL LIGATION      There were no vitals filed for this visit.      Subjective  Assessment - 12/31/16 1313    Subjective Sore today 4/10 posterior knee RT.    Currently in Pain? Yes   Pain Score 4    Pain Location Knee   Pain Orientation Right;Posterior   Pain Descriptors / Indicators Aching   Pain Type Surgical pain   Pain Onset More than a month ago   Pain Frequency Constant   Aggravating Factors  walk /stretch   Pain Relieving Factors rest   Multiple Pain Sites No            [facility] PT Assessment - 12/31/16 0001      AROM   Right Knee Extension 0   Right Knee Flexion 112                     [facility] Adult PT Treatment/Exercise - 12/31/16 0001      Ambulation/Gait   Ambulation/Gait Assistance 6: Modified independent (Device/Increase time)   Ambulation Distance (Feet) 300 Feet   Assistive device None   Gait Pattern Step-through pattern   Ambulation Surface Level;Indoor   Stairs Yes   Stairs Assistance 6: Modified independent (Device/Increase time)   Stair Management Technique Alternating pattern   Number of Stairs 16   Height of Stairs 6  Knee/Hip Exercises: Aerobic   Nustep L5 x 10 minutes LE only      Knee/Hip Exercises: Standing   Knee Flexion 2 sets;10 reps   Knee Flexion Limitations 6 pounds   Hip Abduction Right;2 sets;10 reps   Abduction Limitations 6 pounds.      Knee/Hip Exercises: Seated   Long Arc Quad 20 reps;Right;1 set  then set of 10 to end   [employer] Weight 5 lbs.   Long Arc Quad Limitations 3-5 sec hold     Knee/Hip Exercises: Supine   Bridges 20 reps   Bridges Limitations legs on large bolster.    Straight Leg Raises Right;15 reps   Other Supine Knee/Hip Exercises TKE x 15 with thigh 90 degrees at hips      Vasopneumatic   Number Minutes Vasopneumatic  15 minutes   Vasopnuematic Location  Knee   Vasopneumatic Pressure Low   Vasopneumatic Temperature  34                  PT Short Term Goals - 12/27/16 1454      PT SHORT TERM GOAL #1   Title She will be independent with inital HEP    Status Achieved     PT SHORT TERM GOAL #2   Title She will improve active flexion to 115 degrees   Baseline 113 today   Status Achieved     PT SHORT TERM GOAL #3   Title she will be walking in home without device   Status On-going     PT SHORT TERM GOAL #4   Title She will report decr pain 50% with activity on feet.    Status On-going           PT Long Term Goals - 12/10/16 1338      PT LONG TERM GOAL #1   Title She will be independent with all HEP issued   Time 12   Period Weeks   Status New     PT LONG TERM GOAL #2   Title She will report pain as intermittant   Time 12   Period Weeks   Status New     PT LONG TERM GOAL #3   Title She will return to normal home tasks with 1-2 max pain   Time 12   Period Weeks   Status New     PT LONG TERM GOAL #4   Title She will walk without device in and out of home   Time 12   Period Weeks   Status New     PT LONG TERM GOAL #5   Title She will improve hip abduction strength to 4/5 or better to imptrove stability on feet.    Time 12   Period Weeks   Status New     PT LONG TERM GOAL #6   Title FOTO score improved to < 40% limited   Time 12   Period Weeks   Status New               Plan - 12/31/16 1250    Clinical Impression Statement Continues with good ROM and walking is stable evn on stairs.   Strength is good being able to do closed chain exercise without issue except pain   PT Treatment/Interventions Cryotherapy;Gait training;Stair training;Passive range of motion;Patient/family education;Therapeutic exercise;Balance training;Therapeutic activities;Manual techniques;Taping   PT Next Visit Plan strength of hips and knees , gait, flexion stretching, balance,  gait without  SPC ,  PT Home Exercise Plan HHPT HEP including quad set, LAQ, heel slides, hamstring curs, heel raises, sit-stand, added step up and calf stretch    Consulted and Agree with Plan of Care Patient      Patient will benefit from  skilled therapeutic intervention in order to improve the following deficits and impairments:  Pain, Decreased activity tolerance, Decreased strength, Increased edema, Decreased range of motion, Difficulty walking, Increased muscle spasms  Visit Diagnosis: History of total knee arthroplasty, right  Stiffness of right knee, not elsewhere classified  Localized edema  Right knee pain, unspecified chronicity  Difficulty in walking, not elsewhere classified     Problem List Patient Active Problem List   Diagnosis Date Noted  . Ulcer of right lower extremity ([place]) 11/29/2016  . Arthritis of knee 10/30/2016  . S/P knee surgery 10/30/2016  . Burning pain 10/15/2016  . Low back pain 08/01/2016  . Chronic low back pain 07/18/2016  . Prediabetes 05/30/2016  . Cervicalgia 05/30/2016  . Right knee pain 05/30/2016  . Osteopenia 11/18/2015  . Cephalalgia 07/20/2015  . Thrombocytopenia ([place]) 05/30/2015  . Arthritis of left lower extremity 02/02/2015  . Left knee pain 09/08/2014  . Gout 12/11/2013  . Vision disturbance 12/08/2013  . Severe obesity (BMI >= 40) ([place]) 11/06/2013  . Osteoporosis   . Left lumbar radiculopathy 04/13/2012  . Renal cell cancer ([place])   . External hemorrhoid 11/14/2010  . IBS (irritable bowel syndrome) 11/14/2010  . GERD (gastroesophageal reflux disease) 11/14/2010  . HYPERCHOLESTEROLEMIA, MILD 03/16/2010  . Lymphedema 03/15/2010  . ALLERGIC RHINITIS 08/31/2009  . MIGRAINE HEADACHE 06/14/2009  . SINUSITIS, CHRONIC 06/14/2009  . SYNCOPE 05/16/2009  . Essential hypertension 05/06/2009  . CONSTIPATION, CHRONIC 05/06/2009    Darrel Hoover  PT 12/31/2016, 1:19 PM  [facility] [address] [place], [place], [address] Phone: [phone]   Fax:  [phone]  Name: ANDREIA GANDOLFI MRN: [id number] Date of Birth: [birth year]

## 2017-01-01 ENCOUNTER — Encounter (HOSPITAL_BASED_OUTPATIENT_CLINIC_OR_DEPARTMENT_OTHER): Payer: BLUE CROSS/BLUE SHIELD | Attending: Surgery

## 2017-01-01 DIAGNOSIS — Z6839 Body mass index (BMI) 39.0-39.9, adult: Secondary | ICD-10-CM | POA: Insufficient documentation

## 2017-01-01 DIAGNOSIS — S81801A Unspecified open wound, right lower leg, initial encounter: Secondary | ICD-10-CM | POA: Diagnosis not present

## 2017-01-01 DIAGNOSIS — L97819 Non-pressure chronic ulcer of other part of right lower leg with unspecified severity: Secondary | ICD-10-CM | POA: Diagnosis not present

## 2017-01-01 DIAGNOSIS — I10 Essential (primary) hypertension: Secondary | ICD-10-CM | POA: Insufficient documentation

## 2017-01-01 DIAGNOSIS — Z96651 Presence of right artificial knee joint: Secondary | ICD-10-CM | POA: Insufficient documentation

## 2017-01-03 ENCOUNTER — Ambulatory Visit: Payer: BLUE CROSS/BLUE SHIELD

## 2017-01-03 DIAGNOSIS — R600 Localized edema: Secondary | ICD-10-CM | POA: Diagnosis not present

## 2017-01-03 DIAGNOSIS — R262 Difficulty in walking, not elsewhere classified: Secondary | ICD-10-CM

## 2017-01-03 DIAGNOSIS — Z96651 Presence of right artificial knee joint: Secondary | ICD-10-CM

## 2017-01-03 DIAGNOSIS — M25561 Pain in right knee: Secondary | ICD-10-CM | POA: Diagnosis not present

## 2017-01-03 DIAGNOSIS — M25661 Stiffness of right knee, not elsewhere classified: Secondary | ICD-10-CM | POA: Diagnosis not present

## 2017-01-03 NOTE — Therapy (Signed)
[place], [place], [address] Phone: [phone]   Fax:  [phone]  Physical Therapy Treatment  Patient Details  Name: Janet Le MRN: [id number] Date of Birth: [birth year] Referring Provider: Bonnell Public, MD  Encounter Date: 01/03/2017      PT End of Session - 01/03/17 1148    Visit Number 8   Number of Visits 24   Date for PT Re-Evaluation 03/01/17   Authorization Type BCBS   PT Start Time 5701   PT Stop Time 1240   PT Time Calculation (min) 55 min   Activity Tolerance Patient tolerated treatment well;No increased pain   Behavior During Therapy WFL for tasks assessed/performed      Past Medical History:  Diagnosis Date  . Allergic rhinitis, cause unspecified   . Anxiety   . Blood dyscrasia    platelets low in past  . Chronic back pain   . Chronic constipation   . Chronic sinusitis   . Depression   . Endometriosis   . ENDOMETRIOSIS 05/06/2009   Qualifier: Diagnosis of  By: Varney Daily RN, Butch Penny    . Esophageal stricture   . GERD (gastroesophageal reflux disease)   . Hemorrhoids   . Hiatal hernia   . Hyperlipidemia   . Hypertension   . Irritable bowel syndrome   . Osteoarthritis   . Personal history of diabetes mellitus    controlled with diet only  . Renal cell cancer ([place]) 11/2010 dx   R, s/p cryoablation 02/16/11  . Syncopal episodes    since childhood    Past Surgical History:  Procedure Laterality Date  . BREAST SURGERY     bilateral, cyst removal  . fallopian tube removed    . FUNCTIONAL ENDOSCOPIC SINUS SURGERY    . LASER ABLATION OF THE CERVIX    . 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 TOTAL KNEE ARTHROPLASTY Right 10/30/2016   Procedure: RIGHT TOTAL KNEE ARTHROPLASTY;  Surgeon: Meredith Pel, MD;  Location: [facility];  Service: Orthopedics;  Laterality: Right;  . TUBAL LIGATION      There were no vitals filed for this visit.       Subjective Assessment - 01/03/17 1232    Subjective Sore knee Rt .  4/10.  eels need to continue use of RW.      Currently in Pain? Yes   Pain Score 4    Pain Location Knee   Pain Orientation Right;Posterior   Pain Descriptors / Indicators Aching   Pain Type Surgical pain   Pain Onset More than a month ago   Pain Frequency Constant   Aggravating Factors  walking stretching   Pain Relieving Factors rest                         [facility] Adult PT Treatment/Exercise - 01/03/17 0001      Knee/Hip Exercises: Aerobic   Nustep L5 5 min     Knee/Hip Exercises: Standing   Knee Flexion Right;15 reps   Knee Flexion Limitations 6 pounds   Hip Abduction Right;15 reps   Forward Step Up Right;15 reps;Step Height: 8";Hand Hold: 1   Wall Squat 15 reps;5 seconds   SLS with Vectors x 10 RT and LT abduction and extension +1 hand hold     Knee/Hip Exercises: Seated   Long Arc Quad Weight 5 lbs.   Long [employer] Limitations 10  sec hold     Knee/Hip Exercises: Supine   Heel Slides Limitations Le flex /ext x 12 reps cue no assist with RT hand   Bridges 15 reps;Both   Straight Leg Raises Right;Left;15 reps   Straight Leg Raises Limitations cued to quad set before lift     Cryotherapy   Number Minutes Cryotherapy 12 Minutes   Cryotherapy Location Knee   Type of Cryotherapy Ice pack     Sit to stand x 20 from mat no UE assist             PT Short Term Goals - 01/03/17 1233      PT SHORT TERM GOAL #1   Title She will be independent with inital HEP   Status Achieved     PT SHORT TERM GOAL #2   Title She will improve active flexion to 115 degrees   Baseline 115 today   Status Achieved     PT SHORT TERM GOAL #3   Title she will be walking in home without device   Status On-going     PT SHORT TERM GOAL #4   Title She will report decr pain 50% with activity on feet.    Status On-going           PT Long Term Goals - 01/03/17 1234      PT LONG TERM GOAL #1    Title She will be independent with all HEP issued   Status On-going     PT LONG TERM GOAL #2   Title She will report pain as intermittant   Baseline constant   Status On-going     PT LONG TERM GOAL #3   Title She will return to normal home tasks with 1-2 max pain   Baseline 4/10 general pain   Status On-going     PT LONG TERM GOAL #4   Title She will walk without device in and out of home   Baseline continue with RW   Status On-going     PT LONG TERM GOAL #5   Title She will improve hip abduction strength to 4/5 or better to imptrove stability on feet.    Status Unable to assess     PT LONG TERM GOAL #6   Title FOTO score improved to < 40% limited   Status Unable to assess               Plan - 01/03/17 1148    Clinical Impression Statement Good stability on feet with activity.    Continue with pain eased by cold at end. appears she could do without walker but she does not feel confident withou this out of clinic   PT Treatment/Interventions Cryotherapy;Gait training;Stair training;Passive range of motion;Patient/family education;Therapeutic exercise;Balance training;Therapeutic activities;Manual techniques;Taping   PT Next Visit Plan strength of hips and knees , gait, flexion stretching, balance,  gait without  SPC ,      FOTO NEXT SESSION   PT Home Exercise Plan HHPT HEP including quad set, LAQ, heel slides, hamstring curs, heel raises, sit-stand, added step up and calf stretch    Consulted and Agree with Plan of Care Patient      Patient will benefit from skilled therapeutic intervention in order to improve the following deficits and impairments:  Pain, Decreased activity tolerance, Decreased strength, Increased edema, Decreased range of motion, Difficulty walking, Increased muscle spasms  Visit Diagnosis: History of total knee arthroplasty, right  Stiffness of right knee, not elsewhere classified  Localized edema  Right knee pain, unspecified  chronicity  Difficulty in walking, not elsewhere classified     Problem List Patient Active Problem List   Diagnosis Date Noted  . Ulcer of right lower extremity ([place]) 11/29/2016  . Arthritis of knee 10/30/2016  . S/P knee surgery 10/30/2016  . Burning pain 10/15/2016  . Low back pain 08/01/2016  . Chronic low back pain 07/18/2016  . Prediabetes 05/30/2016  . Cervicalgia 05/30/2016  . Right knee pain 05/30/2016  . Osteopenia 11/18/2015  . Cephalalgia 07/20/2015  . Thrombocytopenia ([place]) 05/30/2015  . Arthritis of left lower extremity 02/02/2015  . Left knee pain 09/08/2014  . Gout 12/11/2013  . Vision disturbance 12/08/2013  . Severe obesity (BMI >= 40) ([place]) 11/06/2013  . Osteoporosis   . Left lumbar radiculopathy 04/13/2012  . Renal cell cancer ([place])   . External hemorrhoid 11/14/2010  . IBS (irritable bowel syndrome) 11/14/2010  . GERD (gastroesophageal reflux disease) 11/14/2010  . HYPERCHOLESTEROLEMIA, MILD 03/16/2010  . Lymphedema 03/15/2010  . ALLERGIC RHINITIS 08/31/2009  . MIGRAINE HEADACHE 06/14/2009  . SINUSITIS, CHRONIC 06/14/2009  . SYNCOPE 05/16/2009  . Essential hypertension 05/06/2009  . CONSTIPATION, CHRONIC 05/06/2009    Darrel Hoover  PT 01/03/2017, 12:36 PM  [facility] [address] [place], [place], [address] Phone: [phone]   Fax:  [phone]  Name: Janet Le MRN: [id number] Date of Birth: 1956-04-03

## 2017-01-04 ENCOUNTER — Other Ambulatory Visit: Payer: Self-pay | Admitting: Emergency Medicine

## 2017-01-04 DIAGNOSIS — L97212 Non-pressure chronic ulcer of right calf with fat layer exposed: Secondary | ICD-10-CM | POA: Diagnosis not present

## 2017-01-04 MED ORDER — AMLODIPINE BESYLATE 5 MG PO TABS: 5.0000 mg | ORAL_TABLET | Freq: Every day | ORAL | 3 refills | Status: DC

## 2017-01-04 NOTE — Telephone Encounter (Signed)
RX sent to [facility] for amlodipine. Verified with pt.

## 2017-01-08 ENCOUNTER — Ambulatory Visit: Payer: BLUE CROSS/BLUE SHIELD

## 2017-01-08 DIAGNOSIS — Z96651 Presence of right artificial knee joint: Secondary | ICD-10-CM

## 2017-01-08 DIAGNOSIS — M25561 Pain in right knee: Secondary | ICD-10-CM

## 2017-01-08 DIAGNOSIS — R262 Difficulty in walking, not elsewhere classified: Secondary | ICD-10-CM | POA: Diagnosis not present

## 2017-01-08 DIAGNOSIS — M25661 Stiffness of right knee, not elsewhere classified: Secondary | ICD-10-CM

## 2017-01-08 DIAGNOSIS — R600 Localized edema: Secondary | ICD-10-CM | POA: Diagnosis not present

## 2017-01-08 NOTE — Therapy (Signed)
[place], [place], [address] Phone: [phone]   Fax:  [phone]  Physical Therapy Treatment  Patient Details  Name: Janet Le MRN: [id number] Date of Birth: 21-Mar-1956 Referring Provider: Bonnell Public, MD  Encounter Date: 01/08/2017      PT End of Session - 01/08/17 1302    Visit Number 9   Number of Visits 24   Date for PT Re-Evaluation 03/01/17   Authorization Type BCBS   PT Start Time 0102   PT Stop Time 0150   PT Time Calculation (min) 48 min   Activity Tolerance Patient tolerated treatment well   Behavior During Therapy [facility] for tasks assessed/performed      Past Medical History:  Diagnosis Date  . Allergic rhinitis, cause unspecified   . Anxiety   . Blood dyscrasia    platelets low in past  . Chronic back pain   . Chronic constipation   . Chronic sinusitis   . Depression   . Endometriosis   . ENDOMETRIOSIS 05/06/2009   Qualifier: Diagnosis of  By: Varney Daily RN, Butch Penny    . Esophageal stricture   . GERD (gastroesophageal reflux disease)   . Hemorrhoids   . Hiatal hernia   . Hyperlipidemia   . Hypertension   . Irritable bowel syndrome   . Osteoarthritis   . Personal history of diabetes mellitus    controlled with diet only  . Renal cell cancer ([place]) 11/2010 dx   R, s/p cryoablation 02/16/11  . Syncopal episodes    since childhood    Past Surgical History:  Procedure Laterality Date  . BREAST SURGERY     bilateral, cyst removal  . fallopian tube removed    . FUNCTIONAL ENDOSCOPIC SINUS SURGERY    . LASER ABLATION OF THE CERVIX    . 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 TOTAL KNEE ARTHROPLASTY Right 10/30/2016   Procedure: RIGHT TOTAL KNEE ARTHROPLASTY;  Surgeon: Meredith Pel, MD;  Location: [facility];  Service: Orthopedics;  Laterality: Right;  . TUBAL LIGATION      There were no vitals filed for this visit.          [facility] PT  Assessment - 01/08/17 0001      Observation/Other Assessments   Focus on Therapeutic Outcomes (FOTO)  54% limited     AROM   Right Knee Extension 0   Right Knee Flexion 110     PROM   Right Knee Flexion 122                     [facility] Adult PT Treatment/Exercise - 01/08/17 0001      Knee/Hip Exercises: Aerobic   Nustep L6 5 min     Knee/Hip Exercises: Standing   Hip Abduction Right;2 sets;10 reps   Abduction Limitations 5 pounds   Forward Step Up Right;15 reps;Hand Hold: 1;Step Height: 8"   Wall Squat 15 reps;5 seconds     Knee/Hip Exercises: Seated   Sit to Sand with UE support;2 sets;10 reps  one set from mat , one set from stool placed under lip of th                  PT Short Term Goals - 01/03/17 1233      PT SHORT TERM GOAL #1   Title She will be independent with inital HEP   Status Achieved  PT SHORT TERM GOAL #2   Title She will improve active flexion to 115 degrees   Baseline 115 today   Status Achieved     PT SHORT TERM GOAL #3   Title she will be walking in home without device   Status On-going     PT SHORT TERM GOAL #4   Title She will report decr pain 50% with activity on feet.    Status On-going           PT Long Term Goals - 01/03/17 1234      PT LONG TERM GOAL #1   Title She will be independent with all HEP issued   Status On-going     PT LONG TERM GOAL #2   Title She will report pain as intermittant   Baseline constant   Status On-going     PT LONG TERM GOAL #3   Title She will return to normal home tasks with 1-2 max pain   Baseline 4/10 general pain   Status On-going     PT LONG TERM GOAL #4   Title She will walk without device in and out of home   Baseline continue with RW   Status On-going     PT LONG TERM GOAL #5   Title She will improve hip abduction strength to 4/5 or better to imptrove stability on feet.    Status Unable to assess     PT LONG TERM GOAL #6   Title FOTO score improved to <  40% limited   Status Unable to assess               Plan - 01/08/17 1406    Clinical Impression Statement Continues with  Good progress . PAssive flexion to 122  degrees so I discussed that to encourage her to  be positive.       PT Treatment/Interventions Cryotherapy;Gait training;Stair training;Passive range of motion;Patient/family education;Therapeutic exercise;Balance training;Therapeutic activities;Manual techniques;Taping   PT Next Visit Plan strength of hips and knees , gait, flexion stretching, balance,  gait without  SPC ,        PT Home Exercise Plan HHPT HEP including quad set, LAQ, heel slides, hamstring curls, heel raises, sit-stand, added step up and calf stretch    Consulted and Agree with Plan of Care Patient      Patient will benefit from skilled therapeutic intervention in order to improve the following deficits and impairments:  Pain, Decreased activity tolerance, Decreased strength, Increased edema, Decreased range of motion, Difficulty walking, Increased muscle spasms  Visit Diagnosis: History of total knee arthroplasty, right  Stiffness of right knee, not elsewhere classified  Localized edema  Right knee pain, unspecified chronicity  Difficulty in walking, not elsewhere classified     Problem List Patient Active Problem List   Diagnosis Date Noted  . Ulcer of right lower extremity ([place]) 11/29/2016  . Arthritis of knee 10/30/2016  . S/P knee surgery 10/30/2016  . Burning pain 10/15/2016  . Low back pain 08/01/2016  . Chronic low back pain 07/18/2016  . Prediabetes 05/30/2016  . Cervicalgia 05/30/2016  . Right knee pain 05/30/2016  . Osteopenia 11/18/2015  . Cephalalgia 07/20/2015  . Thrombocytopenia ([place]) 05/30/2015  . Arthritis of left lower extremity 02/02/2015  . Left knee pain 09/08/2014  . Gout 12/11/2013  . Vision disturbance 12/08/2013  . Severe obesity (BMI >= 40) ([place]) 11/06/2013  . Osteoporosis   . Left lumbar radiculopathy  04/13/2012  . Renal cell cancer ([place])   .  External hemorrhoid 11/14/2010  . IBS (irritable bowel syndrome) 11/14/2010  . GERD (gastroesophageal reflux disease) 11/14/2010  . HYPERCHOLESTEROLEMIA, MILD 03/16/2010  . Lymphedema 03/15/2010  . ALLERGIC RHINITIS 08/31/2009  . MIGRAINE HEADACHE 06/14/2009  . SINUSITIS, CHRONIC 06/14/2009  . SYNCOPE 05/16/2009  . Essential hypertension 05/06/2009  . CONSTIPATION, CHRONIC 05/06/2009    Darrel Hoover PT 01/08/2017, 2:27 PM  [facility] [address] [place], [place], [address] Phone: [phone]   Fax:  [phone]  Name: EDIE VALLANDINGHAM MRN: [id number] Date of Birth: [birth year]

## 2017-01-09 DIAGNOSIS — L97819 Non-pressure chronic ulcer of other part of right lower leg with unspecified severity: Secondary | ICD-10-CM | POA: Diagnosis not present

## 2017-01-09 DIAGNOSIS — S81801A Unspecified open wound, right lower leg, initial encounter: Secondary | ICD-10-CM | POA: Diagnosis not present

## 2017-01-09 DIAGNOSIS — I10 Essential (primary) hypertension: Secondary | ICD-10-CM | POA: Diagnosis not present

## 2017-01-09 DIAGNOSIS — Z6839 Body mass index (BMI) 39.0-39.9, adult: Secondary | ICD-10-CM | POA: Diagnosis not present

## 2017-01-09 DIAGNOSIS — Z96651 Presence of right artificial knee joint: Secondary | ICD-10-CM | POA: Diagnosis not present

## 2017-01-10 ENCOUNTER — Ambulatory Visit: Payer: BLUE CROSS/BLUE SHIELD

## 2017-01-10 ENCOUNTER — Ambulatory Visit (INDEPENDENT_AMBULATORY_CARE_PROVIDER_SITE_OTHER): Payer: BLUE CROSS/BLUE SHIELD | Admitting: Orthopedic Surgery

## 2017-01-10 DIAGNOSIS — M25661 Stiffness of right knee, not elsewhere classified: Secondary | ICD-10-CM

## 2017-01-10 DIAGNOSIS — M25561 Pain in right knee: Secondary | ICD-10-CM

## 2017-01-10 DIAGNOSIS — R262 Difficulty in walking, not elsewhere classified: Secondary | ICD-10-CM | POA: Diagnosis not present

## 2017-01-10 DIAGNOSIS — Z96651 Presence of right artificial knee joint: Secondary | ICD-10-CM

## 2017-01-10 DIAGNOSIS — R600 Localized edema: Secondary | ICD-10-CM | POA: Diagnosis not present

## 2017-01-10 NOTE — Therapy (Addendum)
[place], [place], [address] Phone: [phone]   Fax:  [phone]  Physical Therapy Treatment  Patient Details  Name: Janet Le MRN: [id number] Date of Birth: April 28, 1956 Referring Provider: Bonnell Public, MD  Encounter Date: 01/10/2017      PT End of Session - 01/10/17 1345    Visit Number 10   Number of Visits 24   Date for PT Re-Evaluation 03/01/17   Authorization Type BCBS   PT Start Time 0142   PT Stop Time 0225   PT Time Calculation (min) 43 min   Activity Tolerance Patient tolerated treatment well   Behavior During Therapy [facility] for tasks assessed/performed      Past Medical History:  Diagnosis Date  . Allergic rhinitis, cause unspecified   . Anxiety   . Blood dyscrasia    platelets low in past  . Chronic back pain   . Chronic constipation   . Chronic sinusitis   . Depression   . Endometriosis   . ENDOMETRIOSIS 05/06/2009   Qualifier: Diagnosis of  By: Varney Daily RN, Butch Penny    . Esophageal stricture   . GERD (gastroesophageal reflux disease)   . Hemorrhoids   . Hiatal hernia   . Hyperlipidemia   . Hypertension   . Irritable bowel syndrome   . Osteoarthritis   . Personal history of diabetes mellitus    controlled with diet only  . Renal cell cancer ([place]) 11/2010 dx   R, s/p cryoablation 02/16/11  . Syncopal episodes    since childhood    Past Surgical History:  Procedure Laterality Date  . BREAST SURGERY     bilateral, cyst removal  . fallopian tube removed    . FUNCTIONAL ENDOSCOPIC SINUS SURGERY    . LASER ABLATION OF THE CERVIX    . 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 TOTAL KNEE ARTHROPLASTY Right 10/30/2016   Procedure: RIGHT TOTAL KNEE ARTHROPLASTY;  Surgeon: Meredith Pel, MD;  Location: [place];  Service: Orthopedics;  Laterality: Right;  . TUBAL LIGATION      There were no vitals filed for this visit.      Subjective  Assessment - 01/10/17 1439    Subjective No changes but am usiing SPC now with 3 inch base   Currently in Pain? Yes   Pain Score 4    Pain Location Knee   Pain Orientation Right;Posterior   Pain Descriptors / Indicators Aching   Pain Type Surgical pain   Pain Onset More than a month ago   Pain Frequency Constant   Aggravating Factors  walking stretching   Pain Relieving Factors rest, cold                         [facility] Adult PT Treatment/Exercise - 01/10/17 0001      Ambulation/Gait   Ambulation/Gait Assistance 4: Min guard;6: Modified independent (Device/Increase time)   Ambulation Distance (Feet) 500 Feet   Assistive device None;Straight cane   Gait Pattern Step-through pattern   Ambulation Surface Level;Indoor   Ramp 4: Min assist   Ramp Details (indicate cue type and reason) We walked with gait belt CG but no asssit given, walked outside on sidewalk slope (10-15 degrees)to road and back x 2.       Neuro Re-ed    Neuro Re-ed Details  single leg stand on RT leg 2x30 sec , during  that time frame max time on RT leg 6 sec x2     Knee/Hip Exercises: Aerobic   Nustep L5 7 min     Knee/Hip Exercises: Standing   Knee Flexion Right;20 reps   Knee Flexion Limitations 7 pounds   Hip Abduction Right;2 sets;10 reps   Abduction Limitations 7 pounds   Forward Step Up Right;2 sets;10 reps;Hand Hold: 2   Forward Step Up Limitations 11 inch step    Wall Squat 20 reps;5 seconds     Knee/Hip Exercises: Seated   Long Arc Quad 20 reps;Right;1 set   [employer] Weight 7 lbs.   Long Arc Quad Limitations 5 sec hold   Sit to [employer] without UE support;2 sets;10 reps  17 inch height                   PT Short Term Goals - 01/10/17 1442      PT SHORT TERM GOAL #1   Title She will be independent with inital HEP   Status Achieved     PT SHORT TERM GOAL #2   Title She will improve active flexion to 115 degrees   Status Achieved     PT SHORT TERM GOAL #3    Title she will be walking in home without device   Baseline SPC   Status Partially Met     PT SHORT TERM GOAL #4   Title She will report decr pain 50% with activity on feet.    Status On-going           PT Long Term Goals - 01/03/17 1234      PT LONG TERM GOAL #1   Title She will be independent with all HEP issued   Status On-going     PT LONG TERM GOAL #2   Title She will report pain as intermittant   Baseline constant   Status On-going     PT LONG TERM GOAL #3   Title She will return to normal home tasks with 1-2 max pain   Baseline 4/10 general pain   Status On-going     PT LONG TERM GOAL #4   Title She will walk without device in and out of home   Baseline continue with RW   Status On-going     PT LONG TERM GOAL #5   Title She will improve hip abduction strength to 4/5 or better to imptrove stability on feet.    Status Unable to assess     PT LONG TERM GOAL #6   Title FOTO score improved to < 40% limited   Status Unable to assess               Plan - 01/10/17 1441    Clinical Impression Statement Continues to do well now using cane not walker. Able to walk 10-15 degree slope forward going up and down slop without assist   PT Treatment/Interventions Cryotherapy;Gait training;Stair training;Passive range of motion;Patient/family education;Therapeutic exercise;Balance training;Therapeutic activities;Manual techniques;Taping   PT Next Visit Plan strength of hips and knees , gait, flexion stretching, balance,  gait without  SPC ,        PT Home Exercise Plan HHPT HEP including quad set, LAQ, heel slides, hamstring curls, heel raises, sit-stand, added step up and calf stretch    Consulted and Agree with Plan of Care Patient      Patient will benefit from skilled therapeutic intervention in order to improve the following deficits and impairments:  Pain,  Decreased activity tolerance, Decreased strength, Increased edema, Decreased range of motion, Difficulty  walking, Increased muscle spasms  Visit Diagnosis: History of total knee arthroplasty, right  Stiffness of right knee, not elsewhere classified  Localized edema  Right knee pain, unspecified chronicity     Problem List Patient Active Problem List   Diagnosis Date Noted  . Ulcer of right lower extremity ([place]) 11/29/2016  . Arthritis of knee 10/30/2016  . S/P knee surgery 10/30/2016  . Burning pain 10/15/2016  . Low back pain 08/01/2016  . Chronic low back pain 07/18/2016  . Prediabetes 05/30/2016  . Cervicalgia 05/30/2016  . Right knee pain 05/30/2016  . Osteopenia 11/18/2015  . Cephalalgia 07/20/2015  . Thrombocytopenia ([place]) 05/30/2015  . Arthritis of left lower extremity 02/02/2015  . Left knee pain 09/08/2014  . Gout 12/11/2013  . Vision disturbance 12/08/2013  . Severe obesity (BMI >= 40) ([place]) 11/06/2013  . Osteoporosis   . Left lumbar radiculopathy 04/13/2012  . Renal cell cancer ([place])   . External hemorrhoid 11/14/2010  . IBS (irritable bowel syndrome) 11/14/2010  . GERD (gastroesophageal reflux disease) 11/14/2010  . HYPERCHOLESTEROLEMIA, MILD 03/16/2010  . Lymphedema 03/15/2010  . ALLERGIC RHINITIS 08/31/2009  . MIGRAINE HEADACHE 06/14/2009  . SINUSITIS, CHRONIC 06/14/2009  . SYNCOPE 05/16/2009  . Essential hypertension 05/06/2009  . CONSTIPATION, CHRONIC 05/06/2009    Darrel Hoover  PT 01/10/2017, 2:50 PM  [facility] [address] [place], [place], [address] Phone: [phone]   Fax:  [phone]  Name: NUALA CHILES MRN: [id number] Date of Birth: 09/15/1955

## 2017-01-11 ENCOUNTER — Telehealth: Payer: Self-pay | Admitting: Internal Medicine

## 2017-01-11 NOTE — Telephone Encounter (Signed)
Patient requesting script in place of valsartan.  Patient uses [facility] at [address].  Patient is also requesting shigrix.

## 2017-01-14 ENCOUNTER — Ambulatory Visit (INDEPENDENT_AMBULATORY_CARE_PROVIDER_SITE_OTHER): Payer: BLUE CROSS/BLUE SHIELD | Admitting: Orthopedic Surgery

## 2017-01-14 DIAGNOSIS — M1710 Unilateral primary osteoarthritis, unspecified knee: Secondary | ICD-10-CM

## 2017-01-14 MED ORDER — ZOSTER VAC RECOMB ADJUVANTED 50 MCG/0.5ML IM SUSR: 0.5000 mL | Freq: Once | INTRAMUSCULAR | 1 refills | Status: AC

## 2017-01-14 MED ORDER — LOSARTAN POTASSIUM 100 MG PO TABS: 100.0000 mg | ORAL_TABLET | Freq: Every day | ORAL | 3 refills | Status: DC

## 2017-01-14 NOTE — Progress Notes (Signed)
Post-Op Visit Note   Patient: Janet Le           Date of Birth: 21-Jul-1955           MRN: [id number] Visit Date: 01/14/2017 PCP: Binnie Rail, MD   Assessment & Plan:  Chief Complaint:  Chief Complaint  Patient presents with  . Right Knee - Routine Post Op   Visit Diagnoses: No diagnosis found.  Plan: Janet Le is a 61 year old patient who is now at 2 half months out right total knee replacement.  Doing recently well.  She doesn't have a wound on the back of her proximal calf which was a blister.  She's been treated at the wound center for that in its healing.  On exam she is doing well with her knee.  She has flexion to about 105-110.  Extension is slightly less than 10 compared to 30 on the left.  She is walking well with a walker and her quad strength is improving.  Plan at this time is to see her back in 3-4 months just for clinical recheck on quad strengthening.  Follow-Up Instructions: No Follow-up on file.   Orders:  No orders of the defined types were placed in this encounter.  No orders of the defined types were placed in this encounter.   Imaging: No results found.  PMFS History: Patient Active Problem List   Diagnosis Date Noted  . Ulcer of right lower extremity ([place]) 11/29/2016  . Arthritis of knee 10/30/2016  . S/P knee surgery 10/30/2016  . Burning pain 10/15/2016  . Low back pain 08/01/2016  . Chronic low back pain 07/18/2016  . Prediabetes 05/30/2016  . Cervicalgia 05/30/2016  . Right knee pain 05/30/2016  . Osteopenia 11/18/2015  . Cephalalgia 07/20/2015  . Thrombocytopenia ([place]) 05/30/2015  . Arthritis of left lower extremity 02/02/2015  . Left knee pain 09/08/2014  . Gout 12/11/2013  . Vision disturbance 12/08/2013  . Severe obesity (BMI >= 40) ([place]) 11/06/2013  . Osteoporosis   . Left lumbar radiculopathy 04/13/2012  . Renal cell cancer ([place])   . External hemorrhoid 11/14/2010  . IBS (irritable bowel syndrome) 11/14/2010  .  GERD (gastroesophageal reflux disease) 11/14/2010  . HYPERCHOLESTEROLEMIA, MILD 03/16/2010  . Lymphedema 03/15/2010  . ALLERGIC RHINITIS 08/31/2009  . MIGRAINE HEADACHE 06/14/2009  . SINUSITIS, CHRONIC 06/14/2009  . SYNCOPE 05/16/2009  . Essential hypertension 05/06/2009  . CONSTIPATION, CHRONIC 05/06/2009   Past Medical History:  Diagnosis Date  . Allergic rhinitis, cause unspecified   . Anxiety   . Blood dyscrasia    platelets low in past  . Chronic back pain   . Chronic constipation   . Chronic sinusitis   . Depression   . Endometriosis   . ENDOMETRIOSIS 05/06/2009   Qualifier: Diagnosis of  By: Varney Daily RN, Butch Penny    . Esophageal stricture   . GERD (gastroesophageal reflux disease)   . Hemorrhoids   . Hiatal hernia   . Hyperlipidemia   . Hypertension   . Irritable bowel syndrome   . Osteoarthritis   . Personal history of diabetes mellitus    controlled with diet only  . Renal cell cancer ([place]) 11/2010 dx   R, s/p cryoablation 02/16/11  . Syncopal episodes    since childhood    Family History  Problem Relation Age of Onset  . Diabetes Mother   . Kidney disease Father   . Prostate cancer Father   . Colitis Brother   . Leukemia Brother   .  Multiple sclerosis Sister   . Colon polyps Unknown   . Colon cancer Neg Hx     Past Surgical History:  Procedure Laterality Date  . BREAST SURGERY     bilateral, cyst removal  . fallopian tube removed    . FUNCTIONAL ENDOSCOPIC SINUS SURGERY    . LASER ABLATION OF THE CERVIX    . 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 TOTAL KNEE ARTHROPLASTY Right 10/30/2016   Procedure: RIGHT TOTAL KNEE ARTHROPLASTY;  Surgeon: Meredith Pel, MD;  Location: [facility];  Service: Orthopedics;  Laterality: Right;  . TUBAL LIGATION     Social History   Occupational History  . 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 Smoking status: Never Smoker  .  Smokeless tobacco: Never Used  . Alcohol use No     Comment: rare  . Drug use: No  . Sexual activity: No

## 2017-01-14 NOTE — Telephone Encounter (Signed)
Spoke with pt to inform RXs sent to [facility] and to monitor BP closely.

## 2017-01-14 NOTE — Telephone Encounter (Signed)
New BP med sent

## 2017-01-14 NOTE — Telephone Encounter (Signed)
Please advise on Valsartan alternative. Shingrix has been sent.

## 2017-01-15 ENCOUNTER — Ambulatory Visit: Payer: BLUE CROSS/BLUE SHIELD

## 2017-01-15 DIAGNOSIS — M25661 Stiffness of right knee, not elsewhere classified: Secondary | ICD-10-CM

## 2017-01-15 DIAGNOSIS — Z96651 Presence of right artificial knee joint: Secondary | ICD-10-CM

## 2017-01-15 DIAGNOSIS — R600 Localized edema: Secondary | ICD-10-CM

## 2017-01-15 DIAGNOSIS — M25561 Pain in right knee: Secondary | ICD-10-CM | POA: Diagnosis not present

## 2017-01-15 DIAGNOSIS — R262 Difficulty in walking, not elsewhere classified: Secondary | ICD-10-CM

## 2017-01-15 NOTE — Therapy (Signed)
[place], [place], [address] Phone: [phone]   Fax:  [phone]  Physical Therapy Treatment  Patient Details  Name: Janet Le MRN: [id number] Date of Birth: 05/21/1956 Referring Provider: Bonnell Public, MD  Encounter Date: 01/15/2017      PT End of Session - 01/15/17 1251    Visit Number 11   Number of Visits 24   Date for PT Re-Evaluation 03/01/17   Authorization Type BCBS   PT Start Time 6468   PT Stop Time 1330   PT Time Calculation (min) 45 min   Activity Tolerance Patient tolerated treatment well;No increased pain   Behavior During Therapy WFL for tasks assessed/performed      Past Medical History:  Diagnosis Date  . Allergic rhinitis, cause unspecified   . Anxiety   . Blood dyscrasia    platelets low in past  . Chronic back pain   . Chronic constipation   . Chronic sinusitis   . Depression   . Endometriosis   . ENDOMETRIOSIS 05/06/2009   Qualifier: Diagnosis of  By: Varney Daily RN, Butch Penny    . Esophageal stricture   . GERD (gastroesophageal reflux disease)   . Hemorrhoids   . Hiatal hernia   . Hyperlipidemia   . Hypertension   . Irritable bowel syndrome   . Osteoarthritis   . Personal history of diabetes mellitus    controlled with diet only  . Renal cell cancer ([place]) 11/2010 dx   R, s/p cryoablation 02/16/11  . Syncopal episodes    since childhood    Past Surgical History:  Procedure Laterality Date  . BREAST SURGERY     bilateral, cyst removal  . fallopian tube removed    . FUNCTIONAL ENDOSCOPIC SINUS SURGERY    . LASER ABLATION OF THE CERVIX    . 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 TOTAL KNEE ARTHROPLASTY Right 10/30/2016   Procedure: RIGHT TOTAL KNEE ARTHROPLASTY;  Surgeon: Meredith Pel, MD;  Location: [facility];  Service: Orthopedics;  Laterality: Right;  . TUBAL LIGATION      There were no vitals filed for this  visit.          [facility] PT Assessment - 01/15/17 0001      AROM   Right Knee Flexion 115   Left Knee Flexion 120     PROM   Right Knee Flexion 125                     [facility] Adult PT Treatment/Exercise - 01/15/17 0001      Ambulation/Gait   Ramp 5: Supervision   Ramp Details (indicate cue type and reason) close stand bye  walk up and down ramp forward and back up and down x 3 each.    good balance though she was not sure of her balance      Knee/Hip Exercises: Aerobic   Nustep L5 7 min     Knee/Hip Exercises: Standing   Knee Flexion Right;20 reps   Knee Flexion Limitations 7 pounds   Hip Abduction Right;Left;15 reps;Knee straight   Abduction Limitations 7 pounds on RT leg   Lateral Step Up Right;20 reps;Hand Hold: 1;Step Height: 8"   Other Standing Knee Exercises step to 8 inch platform x 15 reps  7 pounds      Knee/Hip Exercises: Seated   Long Arc Quad 20 reps;Right;1 set   [employer]  Quad Weight 7 lbs.   Long [employer] Limitations 5 sec hold     Manual Therapy   Manual therapy comments lexion stretching  passive     Also single leg stand RT and LT with 10-30 degrees flexion stepping laterally x 10 2 sets with constant stand on support leg             PT Short Term Goals - 01/10/17 1442      PT SHORT TERM GOAL #1   Title She will be independent with inital HEP   Status Achieved     PT SHORT TERM GOAL #2   Title She will improve active flexion to 115 degrees   Status Achieved     PT SHORT TERM GOAL #3   Title she will be walking in home without device   Baseline SPC   Status Partially Met     PT SHORT TERM GOAL #4   Title She will report decr pain 50% with activity on feet.    Status On-going           PT Long Term Goals - 01/03/17 1234      PT LONG TERM GOAL #1   Title She will be independent with all HEP issued   Status On-going     PT LONG TERM GOAL #2   Title She will report pain as intermittant   Baseline constant    Status On-going     PT LONG TERM GOAL #3   Title She will return to normal home tasks with 1-2 max pain   Baseline 4/10 general pain   Status On-going     PT LONG TERM GOAL #4   Title She will walk without device in and out of home   Baseline continue with RW   Status On-going     PT LONG TERM GOAL #5   Title She will improve hip abduction strength to 4/5 or better to imptrove stability on feet.    Status Unable to assess     PT LONG TERM GOAL #6   Title FOTO score improved to < 40% limited   Status Unable to assess               Plan - 01/15/17 1251    Clinical Impression Statement Continues to do well .PROM RT knee flexion to 125 degrees and encoureaged her to get help and push motion as she should be able to incr AROM flexion to 125 degrees   PT Treatment/Interventions Cryotherapy;Gait training;Stair training;Passive range of motion;Patient/family education;Therapeutic exercise;Balance training;Therapeutic activities;Manual techniques;Taping   PT Next Visit Plan strength of hips and knees , gait, flexion stretching, balance,  gait without  SPC ,        PT Home Exercise Plan HHPT HEP including quad set, LAQ, heel slides, hamstring curls, heel raises, sit-stand, added step up and calf stretch    Consulted and Agree with Plan of Care Patient      Patient will benefit from skilled therapeutic intervention in order to improve the following deficits and impairments:  Pain, Decreased activity tolerance, Decreased strength, Increased edema, Decreased range of motion, Difficulty walking, Increased muscle spasms  Visit Diagnosis: History of total knee arthroplasty, right  Stiffness of right knee, not elsewhere classified  Localized edema  Right knee pain, unspecified chronicity  Difficulty in walking, not elsewhere classified     Problem List Patient Active Problem List   Diagnosis Date Noted  . Ulcer of right lower extremity ([place])  11/29/2016  . Arthritis of knee  10/30/2016  . S/P knee surgery 10/30/2016  . Burning pain 10/15/2016  . Low back pain 08/01/2016  . Chronic low back pain 07/18/2016  . Prediabetes 05/30/2016  . Cervicalgia 05/30/2016  . Right knee pain 05/30/2016  . Osteopenia 11/18/2015  . Cephalalgia 07/20/2015  . Thrombocytopenia ([place]) 05/30/2015  . Arthritis of left lower extremity 02/02/2015  . Left knee pain 09/08/2014  . Gout 12/11/2013  . Vision disturbance 12/08/2013  . Severe obesity (BMI >= 40) ([place]) 11/06/2013  . Osteoporosis   . Left lumbar radiculopathy 04/13/2012  . Renal cell cancer ([place])   . External hemorrhoid 11/14/2010  . IBS (irritable bowel syndrome) 11/14/2010  . GERD (gastroesophageal reflux disease) 11/14/2010  . HYPERCHOLESTEROLEMIA, MILD 03/16/2010  . Lymphedema 03/15/2010  . ALLERGIC RHINITIS 08/31/2009  . MIGRAINE HEADACHE 06/14/2009  . SINUSITIS, CHRONIC 06/14/2009  . SYNCOPE 05/16/2009  . Essential hypertension 05/06/2009  . CONSTIPATION, CHRONIC 05/06/2009    Darrel Hoover  PT 01/15/2017, 1:48 PM  [facility] [address] [place], [place], [address] Phone: [phone]   Fax:  [phone]  Name: Janet Le MRN: [id number] Date of Birth: [birth year]

## 2017-01-16 ENCOUNTER — Encounter: Payer: Self-pay | Admitting: Podiatry

## 2017-01-16 ENCOUNTER — Ambulatory Visit (INDEPENDENT_AMBULATORY_CARE_PROVIDER_SITE_OTHER): Payer: BLUE CROSS/BLUE SHIELD | Admitting: Podiatry

## 2017-01-16 DIAGNOSIS — Z6839 Body mass index (BMI) 39.0-39.9, adult: Secondary | ICD-10-CM | POA: Diagnosis not present

## 2017-01-16 DIAGNOSIS — S81801A Unspecified open wound, right lower leg, initial encounter: Secondary | ICD-10-CM | POA: Diagnosis not present

## 2017-01-16 DIAGNOSIS — L97819 Non-pressure chronic ulcer of other part of right lower leg with unspecified severity: Secondary | ICD-10-CM | POA: Diagnosis not present

## 2017-01-16 DIAGNOSIS — Z96651 Presence of right artificial knee joint: Secondary | ICD-10-CM | POA: Diagnosis not present

## 2017-01-16 DIAGNOSIS — B351 Tinea unguium: Secondary | ICD-10-CM

## 2017-01-16 DIAGNOSIS — I10 Essential (primary) hypertension: Secondary | ICD-10-CM | POA: Diagnosis not present

## 2017-01-16 NOTE — Progress Notes (Signed)
   Subjective: Patient presents today for follow-up treatment and evaluation of onychomycosis to the bilateral digits. Patient recently underwent knee surgery to the right lower extremity and developed a complication of a posterior calf wound. Patient discontinued the oral Lamisil for approximately 2-3 months while she was recovering from the surgery. Patient recently resumed oral Lamisil and is taking it currently. Patient believes there is some improvement with the toenails.  Objective: Physical Exam General: The patient is alert and oriented x3 in no acute distress.  Dermatology: Hyperkeratotic, discolored, thickened, onychodystrophy of nails noted bilaterally.  Skin is warm, dry and supple bilateral lower extremities. Negative for open lesions or macerations.  Vascular: Palpable pedal pulses bilaterally. No edema or erythema noted. Capillary refill within normal limits.  Neurological: Epicritic and protective threshold grossly intact bilaterally.   Musculoskeletal Exam: Range of motion within normal limits to all pedal and ankle joints bilateral. Muscle strength 5/5 in all groups bilateral.   Assessment: #1 onychodystrophy bilateral toenails #2 possible onychomycosis #3 hyperkeratotic nails bilateral  Plan of Care:  #1 Patient was evaluated. #2 continue oral Lamisil until finished #3 today regarding a provided prescription for antifungal nail lacquer through [facility] #4 continue over-the-counter urea 40% cream to bilateral feet #5 return to clinic when necessary  Edrick Kins, DPM [facility]  Dr. Edrick Kins, [place]                                        [place], [place] [address]                Office [phone]  Fax [phone]

## 2017-01-17 ENCOUNTER — Ambulatory Visit: Payer: BLUE CROSS/BLUE SHIELD

## 2017-01-17 DIAGNOSIS — M25661 Stiffness of right knee, not elsewhere classified: Secondary | ICD-10-CM | POA: Diagnosis not present

## 2017-01-17 DIAGNOSIS — Z96651 Presence of right artificial knee joint: Secondary | ICD-10-CM

## 2017-01-17 DIAGNOSIS — R262 Difficulty in walking, not elsewhere classified: Secondary | ICD-10-CM

## 2017-01-17 DIAGNOSIS — R600 Localized edema: Secondary | ICD-10-CM | POA: Diagnosis not present

## 2017-01-17 DIAGNOSIS — M25561 Pain in right knee: Secondary | ICD-10-CM

## 2017-01-17 MED ORDER — NONFORMULARY OR COMPOUNDED ITEM: 2 refills | Status: DC

## 2017-01-17 NOTE — Therapy (Signed)
[place], [place], [address] Phone: [phone]   Fax:  [phone]  Physical Therapy Treatment  Patient Details  Name: Janet Le MRN: [id number] Date of Birth: 1956-01-15 Referring Provider: Bonnell Public, MD  Encounter Date: 01/17/2017      PT End of Session - 01/17/17 1537    Visit Number 12   Number of Visits 24   Date for PT Re-Evaluation 03/01/17   Authorization Type BCBS   PT Start Time 0235   PT Stop Time 0323   PT Time Calculation (min) 48 min   Activity Tolerance Patient tolerated treatment well;No increased pain   Behavior During Therapy WFL for tasks assessed/performed      Past Medical History:  Diagnosis Date  . Allergic rhinitis, cause unspecified   . Anxiety   . Blood dyscrasia    platelets low in past  . Chronic back pain   . Chronic constipation   . Chronic sinusitis   . Depression   . Endometriosis   . ENDOMETRIOSIS 05/06/2009   Qualifier: Diagnosis of  By: Varney Daily RN, Butch Penny    . Esophageal stricture   . GERD (gastroesophageal reflux disease)   . Hemorrhoids   . Hiatal hernia   . Hyperlipidemia   . Hypertension   . Irritable bowel syndrome   . Osteoarthritis   . Personal history of diabetes mellitus    controlled with diet only  . Renal cell cancer ([place]) 11/2010 dx   R, s/p cryoablation 02/16/11  . Syncopal episodes    since childhood    Past Surgical History:  Procedure Laterality Date  . BREAST SURGERY     bilateral, cyst removal  . fallopian tube removed    . FUNCTIONAL ENDOSCOPIC SINUS SURGERY    . LASER ABLATION OF THE CERVIX    . 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 TOTAL KNEE ARTHROPLASTY Right 10/30/2016   Procedure: RIGHT TOTAL KNEE ARTHROPLASTY;  Surgeon: Meredith Pel, MD;  Location: [facility];  Service: Orthopedics;  Laterality: Right;  . TUBAL LIGATION      There were no vitals filed for this visit.       Subjective Assessment - 01/17/17 1512    Subjective (P)  Sore today from last session.   thighs.    Pain Score (P)  4    Pain Location (P)  --  thighs   Pain Orientation (P)  Right;Left   Pain Descriptors / Indicators (P)  Sore   Pain Onset (P)  --  knee and thighs.    Pain Frequency (P)  Constant   Aggravating Factors  (P)  walk , stretch   Pain Relieving Factors (P)  rest cold   Multiple Pain Sites (P)  No                         [facility] Adult PT Treatment/Exercise - 01/17/17 0001      Knee/Hip Exercises: Aerobic   Nustep L5 8 min     Knee/Hip Exercises: Standing   Forward Step Up Right;Hand Hold: 1;Step Height: 8";20 reps     Knee/Hip Exercises: Seated   Long Arc Quad Right;20 reps   Long Arc Quad Weight 7 lbs.   Long Arc Quad Limitations 5 sec hold   Hamstring Curl Right;Left  standing   Hamstring Limitations 20 each    Hamstring Weights 4 lbs.  rt  leg   Abduction/Adduction  Right;Left  Standing   Abd/Adduction Limitations x20 each   Abd/Adduction Weights 4 lbs.  RT leg                PT Education - 01/17/17 1536    Education provided Yes   Education Details use of rolling pin for muscular sreness   Person(s) Educated Patient   Methods Explanation   Comprehension Verbalized understanding;Returned demonstration          PT Short Term Goals - 01/17/17 1539      PT SHORT TERM GOAL #1   Title She will be independent with inital HEP   Status Achieved     PT SHORT TERM GOAL #2   Title She will improve active flexion to 115 degrees   Status Achieved     PT SHORT TERM GOAL #3   Title she will be walking in home without device   Baseline SPC   Status Partially Met     PT SHORT TERM GOAL #4   Title She will report decr pain 50% with activity on feet.    Status On-going           PT Long Term Goals - 01/17/17 1539      PT LONG TERM GOAL #1   Title She will be independent with all HEP issued   Status On-going     PT  LONG TERM GOAL #2   Title She will report pain as intermittant   Status On-going     PT LONG TERM GOAL #3   Title She will return to normal home tasks with 1-2 max pain   Baseline 4/10 general pain   Status On-going     PT LONG TERM GOAL #4   Title She will walk without device in and out of home   Baseline using SPC   Status Partially Met     PT LONG TERM GOAL #5   Title She will improve hip abduction strength to 4/5 or better to imptrove stability on feet.    Baseline 4/5 today   Status Achieved     PT LONG TERM GOAL #6   Title FOTO score improved to < 40% limited   Baseline 54% limited  01/08/17   Status On-going               Plan - 01/17/17 1537    Clinical Impression Statement Sore today so I eased off slightly and instructed in STW with roller. She is doing very well with good range and strenght and fucntional activity.   Shewill rest more next day or so and resume stretching and exercise with easing of soreness   PT Treatment/Interventions Cryotherapy;Gait training;Stair training;Passive range of motion;Patient/family education;Therapeutic exercise;Balance training;Therapeutic activities;Manual techniques;Taping   PT Next Visit Plan strength of hips and knees , gait, flexion stretching, balance,  gait without  SPC ,        PT Home Exercise Plan HHPT HEP including quad set, LAQ, heel slides, hamstring curls, heel raises, sit-stand, added step up and calf stretch    Consulted and Agree with Plan of Care Patient      Patient will benefit from skilled therapeutic intervention in order to improve the following deficits and impairments:  Pain, Decreased activity tolerance, Decreased strength, Increased edema, Decreased range of motion, Difficulty walking, Increased muscle spasms  Visit Diagnosis: History of total knee arthroplasty, right  Stiffness of right knee, not elsewhere classified  Localized edema  Right knee  pain, unspecified chronicity  Difficulty in  walking, not elsewhere classified     Problem List Patient Active Problem List   Diagnosis Date Noted  . Ulcer of right lower extremity ([place]) 11/29/2016  . Arthritis of knee 10/30/2016  . S/P knee surgery 10/30/2016  . Burning pain 10/15/2016  . Low back pain 08/01/2016  . Chronic low back pain 07/18/2016  . Prediabetes 05/30/2016  . Cervicalgia 05/30/2016  . Right knee pain 05/30/2016  . Osteopenia 11/18/2015  . Cephalalgia 07/20/2015  . Thrombocytopenia ([place]) 05/30/2015  . Arthritis of left lower extremity 02/02/2015  . Left knee pain 09/08/2014  . Gout 12/11/2013  . Vision disturbance 12/08/2013  . Severe obesity (BMI >= 40) ([place]) 11/06/2013  . Osteoporosis   . Left lumbar radiculopathy 04/13/2012  . Renal cell cancer ([place])   . External hemorrhoid 11/14/2010  . IBS (irritable bowel syndrome) 11/14/2010  . GERD (gastroesophageal reflux disease) 11/14/2010  . HYPERCHOLESTEROLEMIA, MILD 03/16/2010  . Lymphedema 03/15/2010  . ALLERGIC RHINITIS 08/31/2009  . MIGRAINE HEADACHE 06/14/2009  . SINUSITIS, CHRONIC 06/14/2009  . SYNCOPE 05/16/2009  . Essential hypertension 05/06/2009  . CONSTIPATION, CHRONIC 05/06/2009    Darrel Hoover  PT 01/17/2017, 3:42 PM  [facility] [address] [place], [place], [address] Phone: [phone]   Fax:  [phone]  Name: Janet Le MRN: [id number] Date of Birth: 07/26/1955

## 2017-01-17 NOTE — Addendum Note (Signed)
Addended by: Tomi Bamberger on: 01/17/2017 08:21 AM   Modules accepted: Orders

## 2017-01-21 ENCOUNTER — Telehealth: Payer: Self-pay | Admitting: Neurology

## 2017-01-21 NOTE — Telephone Encounter (Signed)
PT called and said that Dr Tomi Likens wanted her to do a diabetic test but she feels she does not need that, also the medication he put her on she wants to know if we have any samples we can give her, but she was not sure of the name of the medication

## 2017-01-21 NOTE — Telephone Encounter (Signed)
Left message on machine for patient to call back.   It looks like at last visit in April she was given samples of Lyrica. I don't see where he prescribed medication.   She has had consistently elevated glucose levels and would benefit from having two hour glucose test.   Awaiting call back to discuss.

## 2017-01-21 NOTE — Telephone Encounter (Signed)
Spoke with patient.   She states she spoke with Dr. Quay Burow, her PCP, and she told her since her HgB A1C was 5.5 that she didn't need the two hour glucose.  She states that she is not working right now and waiting on disability and wants to get samples of Lyrica.  Made her aware Dr. Tomi Likens probably would not want to continue to treat her nerve pain without completing the workup.  Dr. Tomi Likens please advise.

## 2017-01-22 MED ORDER — PREGABALIN 50 MG PO CAPS: 50.0000 mg | ORAL_CAPSULE | Freq: Three times a day (TID) | ORAL | 0 refills | Status: DC

## 2017-01-22 NOTE — Telephone Encounter (Signed)
I don't mind giving her samples of the Lyrica.  It's okay not to get the 2 hour glucose tolerance test.

## 2017-01-22 NOTE — Telephone Encounter (Signed)
We only have enough of a supply of Lyrica 50 mg to give long term.  Last sample given was Lyrica 75 mg - one twice daily.   What dose would you like to give? When do you need to see patient back? She does not have appt.

## 2017-01-22 NOTE — Telephone Encounter (Signed)
Spoke with patient and made her aware.   Lyrica at front for patient pick up.

## 2017-01-22 NOTE — Telephone Encounter (Signed)
We can give her the 50mg , have her take 50mg  three times daily.  She can follow up in 4-5 months.

## 2017-01-23 ENCOUNTER — Telehealth: Payer: Self-pay | Admitting: Internal Medicine

## 2017-01-23 ENCOUNTER — Encounter (HOSPITAL_BASED_OUTPATIENT_CLINIC_OR_DEPARTMENT_OTHER): Payer: BLUE CROSS/BLUE SHIELD | Attending: Surgery

## 2017-01-23 DIAGNOSIS — I10 Essential (primary) hypertension: Secondary | ICD-10-CM | POA: Diagnosis not present

## 2017-01-23 DIAGNOSIS — Z872 Personal history of diseases of the skin and subcutaneous tissue: Secondary | ICD-10-CM | POA: Diagnosis not present

## 2017-01-23 DIAGNOSIS — S81801D Unspecified open wound, right lower leg, subsequent encounter: Secondary | ICD-10-CM | POA: Diagnosis not present

## 2017-01-23 DIAGNOSIS — Z6839 Body mass index (BMI) 39.0-39.9, adult: Secondary | ICD-10-CM | POA: Insufficient documentation

## 2017-01-23 DIAGNOSIS — Z09 Encounter for follow-up examination after completed treatment for conditions other than malignant neoplasm: Secondary | ICD-10-CM | POA: Diagnosis not present

## 2017-01-23 DIAGNOSIS — E119 Type 2 diabetes mellitus without complications: Secondary | ICD-10-CM | POA: Insufficient documentation

## 2017-01-23 DIAGNOSIS — Z96651 Presence of right artificial knee joint: Secondary | ICD-10-CM | POA: Insufficient documentation

## 2017-01-23 DIAGNOSIS — M810 Age-related osteoporosis without current pathological fracture: Secondary | ICD-10-CM | POA: Diagnosis not present

## 2017-01-23 NOTE — Telephone Encounter (Signed)
Patient is being moved to public housing. She needs a letter from her PCP saying that she is disabled. That she would need to be in a safe place for the disabled. That is needs to be on the bottom level, due to the fact that it is hard for her to walk up and down stairs. Please advise. Thank you.   Dr. Quay Burow if you need the letter typed, please just let me know.

## 2017-01-23 NOTE — Telephone Encounter (Signed)
Yes, ok to write.  thanks

## 2017-01-23 NOTE — Telephone Encounter (Signed)
Are you okay with me writing this letter? 

## 2017-01-24 ENCOUNTER — Encounter: Payer: Self-pay | Admitting: Emergency Medicine

## 2017-01-24 NOTE — Telephone Encounter (Signed)
Letter written. Placed upfront for pick-up at pts request.

## 2017-01-29 ENCOUNTER — Ambulatory Visit: Payer: BLUE CROSS/BLUE SHIELD | Attending: Internal Medicine

## 2017-01-29 DIAGNOSIS — R600 Localized edema: Secondary | ICD-10-CM | POA: Diagnosis not present

## 2017-01-29 DIAGNOSIS — Z96651 Presence of right artificial knee joint: Secondary | ICD-10-CM | POA: Diagnosis not present

## 2017-01-29 DIAGNOSIS — R262 Difficulty in walking, not elsewhere classified: Secondary | ICD-10-CM | POA: Diagnosis not present

## 2017-01-29 DIAGNOSIS — M25561 Pain in right knee: Secondary | ICD-10-CM | POA: Diagnosis not present

## 2017-01-29 DIAGNOSIS — M25661 Stiffness of right knee, not elsewhere classified: Secondary | ICD-10-CM | POA: Diagnosis not present

## 2017-01-29 NOTE — Therapy (Signed)
[place], [place], [address] Phone: [phone]   Fax:  [phone]  Physical Therapy Treatment  Patient Details  Name: Janet Le MRN: [id number] Date of Birth: 05-02-1956 Referring Provider: Bonnell Public, MD  Encounter Date: 01/29/2017      PT End of Session - 01/29/17 1311    Visit Number 13   Number of Visits 24   Date for PT Re-Evaluation 03/01/17   Authorization Type BCBS   PT Start Time 0115   PT Stop Time 0200   PT Time Calculation (min) 45 min   Activity Tolerance Patient tolerated treatment well   Behavior During Therapy [facility] for tasks assessed/performed      Past Medical History:  Diagnosis Date  . Allergic rhinitis, cause unspecified   . Anxiety   . Blood dyscrasia    platelets low in past  . Chronic back pain   . Chronic constipation   . Chronic sinusitis   . Depression   . Endometriosis   . ENDOMETRIOSIS 05/06/2009   Qualifier: Diagnosis of  By: Varney Daily RN, Butch Penny    . Esophageal stricture   . GERD (gastroesophageal reflux disease)   . Hemorrhoids   . Hiatal hernia   . Hyperlipidemia   . Hypertension   . Irritable bowel syndrome   . Osteoarthritis   . Personal history of diabetes mellitus    controlled with diet only  . Renal cell cancer ([place]) 11/2010 dx   R, s/p cryoablation 02/16/11  . Syncopal episodes    since childhood    Past Surgical History:  Procedure Laterality Date  . BREAST SURGERY     bilateral, cyst removal  . fallopian tube removed    . FUNCTIONAL ENDOSCOPIC SINUS SURGERY    . LASER ABLATION OF THE CERVIX    . 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 TOTAL KNEE ARTHROPLASTY Right 10/30/2016   Procedure: RIGHT TOTAL KNEE ARTHROPLASTY;  Surgeon: Meredith Pel, MD;  Location: [facility];  Service: Orthopedics;  Laterality: Right;  . TUBAL LIGATION      There were no vitals filed for this visit.      Subjective  Assessment - 01/29/17 1319    Subjective no complaints except lateral RT knee soreness.    Currently in Pain? Yes   Pain Score 2    Pain Location Knee   Pain Orientation Right;Lateral   Pain Descriptors / Indicators Aching;Sore   Pain Type Surgical pain   Pain Onset More than a month ago   Pain Frequency Intermittent   Aggravating Factors  walk / stretch   Pain Relieving Factors rest cold   Multiple Pain Sites No            [facility] PT Assessment - 01/29/17 0001      Balance   Balance Assessed Yes     Standardized Balance Assessment   Standardized Balance Assessment Berg Balance Test     Berg Balance Test   Sit to Stand Able to stand without using hands and stabilize independently   Standing Unsupported Able to stand safely 2 minutes   Sitting with Back Unsupported but Feet Supported on Floor or Stool Able to sit safely and securely 2 minutes   Stand to Sit Sits safely with minimal use of hands   Transfers Able to transfer safely, minor use of hands   Standing Unsupported with Eyes Closed Able to stand 10 seconds  safely   Standing Ubsupported with Feet Together Able to place feet together independently and stand 1 minute safely   From Standing, Reach Forward with Outstretched Arm Can reach confidently >25 cm (10")   From Standing Position, Pick up Object from Floor Able to pick up shoe safely and easily   From Standing Position, Turn to Look Behind Over each Shoulder Looks behind from both sides and weight shifts well   Turn 360 Degrees Able to turn 360 degrees safely in 4 seconds or less   Standing Unsupported, Alternately Place Feet on Step/Stool Able to stand independently and safely and complete 8 steps in 20 seconds   Standing Unsupported, One Foot in Front Able to take small step independently and hold 30 seconds   Standing on One Leg Able to lift leg independently and hold equal to or more than 3 seconds   Total Score 52                     [facility] Adult PT  Treatment/Exercise - 01/29/17 0001      Ambulation/Gait   Ambulation/Gait Assistance 7: Independent   Stairs Yes   Stairs Assistance 6: Modified independent (Device/Increase time)   Stair Management Technique Two rails;Alternating pattern     Neuro Re-ed    Neuro Re-ed Details  tandem stance with body rotation .  reviewed options for safe practice at ho,me for improving balance.      Knee/Hip Exercises: Standing   Hip Abduction Right;Left   Abduction Limitations 7 pounds  on RT x 15 reps    Other Standing Knee Exercises with mini squat LT leg step sideway and forward to heel for stability on RT leg.          Knee/Hip Exercises: Seated   Long Arc Quad Right   Long Arc Quad Weight 7 lbs.   Long Arc Quad Limitations 5 sec hold, 25 rps   Hamstring Limitations 20 each                PT Education - 01/29/17 1410    Education provided Yes   Education Details balance activity tnadem and single lege and safety measures for home   Berg Score and decreased need for SPc for safety   Person(s) Educated Patient   Methods Explanation;Demonstration;Tactile cues;Verbal cues   Comprehension Returned demonstration;Verbalized understanding          PT Short Term Goals - 01/29/17 1412      PT SHORT TERM GOAL #4   Title She will report decr pain 50% with activity on feet.    Status Achieved           PT Long Term Goals - 01/29/17 1412      PT LONG TERM GOAL #1   Title She will be independent with all HEP issued     PT LONG TERM GOAL #2   Title She will report pain as intermittant   Status Achieved     PT LONG TERM GOAL #3   Title She will return to normal home tasks with 1-2 max pain   Status On-going     PT LONG TERM GOAL #4   Title She will walk without device in and out of home   Baseline using SPC   Status On-going     PT LONG TERM GOAL #5   Title She will improve hip abduction strength to 4/5 or better to imptrove stability on feet.    Status Achieved  PT  LONG TERM GOAL #6   Title FOTO score improved to < 40% limited   Status Unable to assess               Plan - 01/29/17 1312    Clinical Impression Statement she is still improveing .  BERG score 52 so should be safe with out device .   stairs with 2 rails she is safe.,    PT Treatment/Interventions Cryotherapy;Gait training;Stair training;Passive range of motion;Patient/family education;Therapeutic exercise;Balance training;Therapeutic activities;Manual techniques;Taping   PT Next Visit Plan strength of hips and knees , gait, flexion stretching, balance,  gait without  SPC ,        PT Home Exercise Plan HHPT HEP including quad set, LAQ, heel slides, hamstring curls, heel raises, sit-stand, added step up and calf stretch    Consulted and Agree with Plan of Care Patient      Patient will benefit from skilled therapeutic intervention in order to improve the following deficits and impairments:  Pain, Decreased activity tolerance, Decreased strength, Increased edema, Decreased range of motion, Difficulty walking, Increased muscle spasms  Visit Diagnosis: History of total knee arthroplasty, right  Stiffness of right knee, not elsewhere classified  Localized edema  Right knee pain, unspecified chronicity  Difficulty in walking, not elsewhere classified     Problem List Patient Active Problem List   Diagnosis Date Noted  . Ulcer of right lower extremity ([place]) 11/29/2016  . Arthritis of knee 10/30/2016  . S/P knee surgery 10/30/2016  . Burning pain 10/15/2016  . Low back pain 08/01/2016  . Chronic low back pain 07/18/2016  . Prediabetes 05/30/2016  . Cervicalgia 05/30/2016  . Right knee pain 05/30/2016  . Osteopenia 11/18/2015  . Cephalalgia 07/20/2015  . Thrombocytopenia ([place]) 05/30/2015  . Arthritis of left lower extremity 02/02/2015  . Left knee pain 09/08/2014  . Gout 12/11/2013  . Vision disturbance 12/08/2013  . Severe obesity (BMI >= 40) ([place]) 11/06/2013  .  Osteoporosis   . Left lumbar radiculopathy 04/13/2012  . Renal cell cancer ([place])   . External hemorrhoid 11/14/2010  . IBS (irritable bowel syndrome) 11/14/2010  . GERD (gastroesophageal reflux disease) 11/14/2010  . HYPERCHOLESTEROLEMIA, MILD 03/16/2010  . Lymphedema 03/15/2010  . ALLERGIC RHINITIS 08/31/2009  . MIGRAINE HEADACHE 06/14/2009  . SINUSITIS, CHRONIC 06/14/2009  . SYNCOPE 05/16/2009  . Essential hypertension 05/06/2009  . CONSTIPATION, CHRONIC 05/06/2009    Darrel Hoover  PT 01/29/2017, 2:14 PM  [facility] [address] [place], [place], [address] Phone: [phone]   Fax:  [phone]  Name: Janet Le MRN: [id number] Date of Birth: December 12, 1955

## 2017-01-31 ENCOUNTER — Ambulatory Visit: Payer: BLUE CROSS/BLUE SHIELD

## 2017-01-31 DIAGNOSIS — M25561 Pain in right knee: Secondary | ICD-10-CM | POA: Diagnosis not present

## 2017-01-31 DIAGNOSIS — R262 Difficulty in walking, not elsewhere classified: Secondary | ICD-10-CM | POA: Diagnosis not present

## 2017-01-31 DIAGNOSIS — R600 Localized edema: Secondary | ICD-10-CM | POA: Diagnosis not present

## 2017-01-31 DIAGNOSIS — M25661 Stiffness of right knee, not elsewhere classified: Secondary | ICD-10-CM

## 2017-01-31 DIAGNOSIS — Z96651 Presence of right artificial knee joint: Secondary | ICD-10-CM | POA: Diagnosis not present

## 2017-01-31 NOTE — Therapy (Signed)
[place], [place], [address] Phone: [phone]   Fax:  [phone]  Physical Therapy Treatment  Patient Details  Name: Janet Le MRN: [id number] Date of Birth: [birth year] Referring Provider: Bonnell Public, MD  Encounter Date: 01/31/2017      PT End of Session - 01/31/17 1309    Visit Number 12   Number of Visits 24   Date for PT Re-Evaluation 03/01/17   Authorization Type BCBS   PT Start Time 0100   PT Stop Time 0150   PT Time Calculation (min) 50 min   Activity Tolerance Patient tolerated treatment well;No increased pain   Behavior During Therapy [facility] for tasks assessed/performed      Past Medical History:  Diagnosis Date  . Allergic rhinitis, cause unspecified   . Anxiety   . Blood dyscrasia    platelets low in past  . Chronic back pain   . Chronic constipation   . Chronic sinusitis   . Depression   . Endometriosis   . ENDOMETRIOSIS 05/06/2009   Qualifier: Diagnosis of  By: Varney Daily RN, Butch Penny    . Esophageal stricture   . GERD (gastroesophageal reflux disease)   . Hemorrhoids   . Hiatal hernia   . Hyperlipidemia   . Hypertension   . Irritable bowel syndrome   . Osteoarthritis   . Personal history of diabetes mellitus    controlled with diet only  . Renal cell cancer ([place]) 11/2010 dx   R, s/p cryoablation 02/16/11  . Syncopal episodes    since childhood    Past Surgical History:  Procedure Laterality Date  . BREAST SURGERY     bilateral, cyst removal  . fallopian tube removed    . FUNCTIONAL ENDOSCOPIC SINUS SURGERY    . LASER ABLATION OF THE CERVIX    . 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 TOTAL KNEE ARTHROPLASTY Right 10/30/2016   Procedure: RIGHT TOTAL KNEE ARTHROPLASTY;  Surgeon: Meredith Pel, MD;  Location: [facility];  Service: Orthopedics;  Laterality: Right;  . TUBAL LIGATION      There were no vitals filed for this visit.       Subjective Assessment - 01/31/17 1310    Subjective Doing well except lateral knee pain   Currently in Pain? Yes   Pain Score 2    Pain Location Knee   Pain Orientation Right;Lateral   Pain Descriptors / Indicators Aching;Sore                         [facility] Adult PT Treatment/Exercise - 01/31/17 0001      Ambulation/Gait   Gait velocity 2.5 feet /sec   Gait Comments walking 300 feet or more with cuing to incr speed. ALso walking  backward      Knee/Hip Exercises: Aerobic   Nustep L5 8 min     Knee/Hip Exercises: Standing   Step Down Right;Hand Hold: 2;Step Height: 4";10 reps   Wall Squat Limitations 30 reps   Other Standing Knee Exercises side steps Lt and RT with bent knees work on weight on stance leg full until foot touches on step/   and forward to heel toe LT and RT.  Spent much of time with this activityincorporating balance also,  with and withut UE support  for balance  and cues for body alighnment and dec weight shift before foot touches floor  PT Education - 01/31/17 1404    Education provided Yes   Education Details discussed benefit and safety options for  single leg standing exercises at home  with light stepping forward and back   Person(s) Educated Patient   Methods Explanation;Demonstration;Verbal cues;Tactile cues   Comprehension Verbalized understanding;Returned demonstration          PT Short Term Goals - 01/29/17 1412      PT SHORT TERM GOAL #4   Title She will report decr pain 50% with activity on feet.    Status Achieved           PT Long Term Goals - 01/29/17 1412      PT LONG TERM GOAL #1   Title She will be independent with all HEP issued     PT LONG TERM GOAL #2   Title She will report pain as intermittant   Status Achieved     PT LONG TERM GOAL #3   Title She will return to normal home tasks with 1-2 max pain   Status On-going     PT LONG TERM GOAL #4   Title She will walk without device  in and out of home   Baseline using SPC   Status On-going     PT LONG TERM GOAL #5   Title She will improve hip abduction strength to 4/5 or better to imptrove stability on feet.    Status Achieved     PT LONG TERM GOAL #6   Title FOTO score improved to < 40% limited   Status Unable to assess               Plan - 01/31/17 1309    Clinical Impression Statement Doing well with weight bearing exer but not reading for transoition at home without UE support.     Speed is slow but  close to normal when pushed but she wants to go slower.      PT Treatment/Interventions Cryotherapy;Gait training;Stair training;Passive range of motion;Patient/family education;Therapeutic exercise;Balance training;Therapeutic activities;Manual techniques;Taping   PT Next Visit Plan strength of hips and knees , gait,   Measure ROM flexion and  flexion stretching if needed, balance,  gait without  SPC ,        PT Home Exercise Plan HHPT HEP including quad set, LAQ, heel slides, hamstring curls, heel raises, sit-stand, added step up and calf stretch , side and forward touching on non stance foot     Consulted and Agree with Plan of Care Patient      Patient will benefit from skilled therapeutic intervention in order to improve the following deficits and impairments:  Pain, Decreased activity tolerance, Decreased strength, Increased edema, Decreased range of motion, Difficulty walking, Increased muscle spasms  Visit Diagnosis: History of total knee arthroplasty, right  Stiffness of right knee, not elsewhere classified  Localized edema  Right knee pain, unspecified chronicity  Difficulty in walking, not elsewhere classified     Problem List Patient Active Problem List   Diagnosis Date Noted  . Ulcer of right lower extremity ([place]) 11/29/2016  . Arthritis of knee 10/30/2016  . S/P knee surgery 10/30/2016  . Burning pain 10/15/2016  . Low back pain 08/01/2016  . Chronic low back pain 07/18/2016   . Prediabetes 05/30/2016  . Cervicalgia 05/30/2016  . Right knee pain 05/30/2016  . Osteopenia 11/18/2015  . Cephalalgia 07/20/2015  . Thrombocytopenia ([place]) 05/30/2015  . Arthritis of left lower extremity 02/02/2015  . Left knee pain 09/08/2014  .  Gout 12/11/2013  . Vision disturbance 12/08/2013  . Severe obesity (BMI >= 40) ([place]) 11/06/2013  . Osteoporosis   . Left lumbar radiculopathy 04/13/2012  . Renal cell cancer ([place])   . External hemorrhoid 11/14/2010  . IBS (irritable bowel syndrome) 11/14/2010  . GERD (gastroesophageal reflux disease) 11/14/2010  . HYPERCHOLESTEROLEMIA, MILD 03/16/2010  . Lymphedema 03/15/2010  . ALLERGIC RHINITIS 08/31/2009  . MIGRAINE HEADACHE 06/14/2009  . SINUSITIS, CHRONIC 06/14/2009  . SYNCOPE 05/16/2009  . Essential hypertension 05/06/2009  . CONSTIPATION, CHRONIC 05/06/2009    Darrel Hoover  PT 01/31/2017, 2:13 PM  [facility] [address] [place], [place], [address] Phone: [phone]   Fax:  [phone]  Name: MAGHEN GROUP MRN: [id number] Date of Birth: 1956-05-26

## 2017-02-04 ENCOUNTER — Ambulatory Visit: Payer: BLUE CROSS/BLUE SHIELD

## 2017-02-04 DIAGNOSIS — M25661 Stiffness of right knee, not elsewhere classified: Secondary | ICD-10-CM

## 2017-02-04 DIAGNOSIS — M25561 Pain in right knee: Secondary | ICD-10-CM | POA: Diagnosis not present

## 2017-02-04 DIAGNOSIS — Z96651 Presence of right artificial knee joint: Secondary | ICD-10-CM | POA: Diagnosis not present

## 2017-02-04 DIAGNOSIS — R600 Localized edema: Secondary | ICD-10-CM

## 2017-02-04 DIAGNOSIS — R262 Difficulty in walking, not elsewhere classified: Secondary | ICD-10-CM

## 2017-02-04 NOTE — Therapy (Signed)
[place], [place], [address] Phone: [phone]   Fax:  [phone]  Physical Therapy Treatment  Patient Details  Name: Janet Le MRN: [id number] Date of Birth: [birth year] Referring Provider: Bonnell Public, MD  Encounter Date: 02/04/2017      PT End of Session - 02/04/17 1216    Visit Number 13   Number of Visits 24   Date for PT Re-Evaluation 03/01/17   Authorization Type BCBS   PT Start Time 1215   PT Stop Time 1315   PT Time Calculation (min) 60 min   Activity Tolerance Patient tolerated treatment well   Behavior During Therapy [facility] for tasks assessed/performed      Past Medical History:  Diagnosis Date  . Allergic rhinitis, cause unspecified   . Anxiety   . Blood dyscrasia    platelets low in past  . Chronic back pain   . Chronic constipation   . Chronic sinusitis   . Depression   . Endometriosis   . ENDOMETRIOSIS 05/06/2009   Qualifier: Diagnosis of  By: Varney Daily RN, Butch Penny    . Esophageal stricture   . GERD (gastroesophageal reflux disease)   . Hemorrhoids   . Hiatal hernia   . Hyperlipidemia   . Hypertension   . Irritable bowel syndrome   . Osteoarthritis   . Personal history of diabetes mellitus    controlled with diet only  . Renal cell cancer ([place]) 11/2010 dx   R, s/p cryoablation 02/16/11  . Syncopal episodes    since childhood    Past Surgical History:  Procedure Laterality Date  . BREAST SURGERY     bilateral, cyst removal  . fallopian tube removed    . FUNCTIONAL ENDOSCOPIC SINUS SURGERY    . LASER ABLATION OF THE CERVIX    . 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 TOTAL KNEE ARTHROPLASTY Right 10/30/2016   Procedure: RIGHT TOTAL KNEE ARTHROPLASTY;  Surgeon: Meredith Pel, MD;  Location: [facility];  Service: Orthopedics;  Laterality: Right;  . TUBAL LIGATION      There were no vitals filed for this visit.      Subjective  Assessment - 02/04/17 1216    Subjective No pain today   Currently in Pain? No/denies            [facility] PT Assessment - 02/04/17 0001      AROM   Right Knee Extension 0   Right Knee Flexion 112                     [facility] Adult PT Treatment/Exercise - 02/04/17 0001      Ambulation/Gait   Gait Comments backward walking 50 feet. Step over foam roll x 15 reps 1 handhold and step ping with RT foot on thinner air pad x 15     Knee/Hip Exercises: Stretches   Knee: Self-Stretch to increase Flexion Right;3 reps;30 seconds     Knee/Hip Exercises: Aerobic   Nustep L6 5 min     Knee/Hip Exercises: Standing   Heel Raises Limitations 25 reps   Knee Flexion Right;20 reps   Knee Flexion Limitations 8 pounds   Hip Abduction Right;Left   Abduction Limitations p8 ounds  on RT x 20 reps    Forward Step Up Right;Hand Hold: 1;Step Height: 8";15 reps   Step Down Right;Hand Hold: 2;Step Height: 4";15 reps     Knee/Hip Exercises:  Seated   Long Arc Quad Weight 8 lbs.   Long [employer] Limitations 5 sec hold, 30 rps   Other Seated Knee/Hip Exercises reaching  oup facing wall with weight to RT leg with cues to contract glutes and quads to balance on RT leg x 15 , caught pt x1 due to LOB from fatigue.      Knee/Hip Exercises: Supine   Bridges 15 reps;Both   Straight Leg Raises Right;2 sets;10 reps     Knee/Hip Exercises: Sidelying   Hip ABduction Right;20 reps                  PT Short Term Goals - 01/29/17 1412      PT SHORT TERM GOAL #4   Title She will report decr pain 50% with activity on feet.    Status Achieved           PT Long Term Goals - 02/04/17 1314      PT LONG TERM GOAL #1   Title She will be independent with all HEP issued   Status On-going     PT LONG TERM GOAL #2   Title She will report pain as intermittant   Status Achieved     PT LONG TERM GOAL #3   Title She will return to normal home tasks with 1-2 max pain   Status On-going     PT  LONG TERM GOAL #4   Title She will walk without device in and out of home   Status On-going     PT LONG TERM GOAL #5   Title She will improve hip abduction strength to 4/5 or better to imptrove stability on feet.    Status Achieved     PT LONG TERM GOAL #6   Title FOTO score improved to < 40% limited   Baseline 54% limited  01/08/17               Plan - 02/04/17 1311    Clinical Impression Statement she came on wrong day but had time to see her. she was able to handle incr weight with exer and resistance on nustep She complains about incr load but still did all that was asked.   PT Treatment/Interventions Cryotherapy;Gait training;Stair training;Passive range of motion;Patient/family education;Therapeutic exercise;Balance training;Therapeutic activities;Manual techniques;Taping   PT Next Visit Plan strength of hips and knees , gait,  flexion stretching if needed, balance,  gait without  SPC  iincr weight to 10 pounds,        PT Home Exercise Plan HHPT HEP including quad set, LAQ, heel slides, hamstring curls, heel raises, sit-stand, added step up and calf stretch , side and forward touching on non stance foot     Consulted and Agree with Plan of Care Patient      Patient will benefit from skilled therapeutic intervention in order to improve the following deficits and impairments:  Pain, Decreased activity tolerance, Decreased strength, Increased edema, Decreased range of motion, Difficulty walking, Increased muscle spasms  Visit Diagnosis: History of total knee arthroplasty, right  Stiffness of right knee, not elsewhere classified  Localized edema  Right knee pain, unspecified chronicity  Difficulty in walking, not elsewhere classified     Problem List Patient Active Problem List   Diagnosis Date Noted  . Ulcer of right lower extremity ([place]) 11/29/2016  . Arthritis of knee 10/30/2016  . S/P knee surgery 10/30/2016  . Burning pain 10/15/2016  . Low back pain  08/01/2016  . Chronic  low back pain 07/18/2016  . Prediabetes 05/30/2016  . Cervicalgia 05/30/2016  . Right knee pain 05/30/2016  . Osteopenia 11/18/2015  . Cephalalgia 07/20/2015  . Thrombocytopenia ([place]) 05/30/2015  . Arthritis of left lower extremity 02/02/2015  . Left knee pain 09/08/2014  . Gout 12/11/2013  . Vision disturbance 12/08/2013  . Severe obesity (BMI >= 40) ([name]) 11/06/2013  . Osteoporosis   . Left lumbar radiculopathy 04/13/2012  . Renal cell cancer ([place])   . External hemorrhoid 11/14/2010  . IBS (irritable bowel syndrome) 11/14/2010  . GERD (gastroesophageal reflux disease) 11/14/2010  . HYPERCHOLESTEROLEMIA, MILD 03/16/2010  . Lymphedema 03/15/2010  . ALLERGIC RHINITIS 08/31/2009  . MIGRAINE HEADACHE 06/14/2009  . SINUSITIS, CHRONIC 06/14/2009  . SYNCOPE 05/16/2009  . Essential hypertension 05/06/2009  . CONSTIPATION, CHRONIC 05/06/2009    Darrel Hoover   PT 02/04/2017, 1:17 PM  [facility] [address] [place], [place], [address] Phone: [phone]   Fax:  [phone]  Name: Janet Le MRN: [id number] Date of Birth: 1956-06-07

## 2017-02-07 ENCOUNTER — Other Ambulatory Visit: Payer: Self-pay | Admitting: Internal Medicine

## 2017-02-07 MED ORDER — LOSARTAN POTASSIUM 100 MG PO TABS: 100.0000 mg | ORAL_TABLET | Freq: Every day | ORAL | 0 refills | Status: DC

## 2017-02-07 MED ORDER — FUROSEMIDE 20 MG PO TABS: 20.0000 mg | ORAL_TABLET | Freq: Every day | ORAL | 0 refills | Status: DC

## 2017-02-07 MED ORDER — PROPRANOLOL HCL 80 MG PO TABS: 80.0000 mg | ORAL_TABLET | Freq: Two times a day (BID) | ORAL | 0 refills | Status: DC

## 2017-02-07 MED ORDER — ALENDRONATE SODIUM 70 MG PO TABS: 70.0000 mg | ORAL_TABLET | ORAL | 0 refills | Status: DC

## 2017-02-07 NOTE — Telephone Encounter (Signed)
Pt called in and needs refill on :  Arorvastatin Furosemide  Propranlol Alendronate   [facility] [address]

## 2017-02-07 NOTE — Telephone Encounter (Signed)
Spoke with pt, only a 30 day is needed from local until she receives mail order. RXs sent

## 2017-02-07 NOTE — Addendum Note (Signed)
Addended by: Terence Lux B on: 02/07/2017 01:51 PM   Modules accepted: Orders

## 2017-02-11 ENCOUNTER — Ambulatory Visit: Payer: BLUE CROSS/BLUE SHIELD

## 2017-02-12 ENCOUNTER — Ambulatory Visit: Payer: BLUE CROSS/BLUE SHIELD | Admitting: Physical Therapy

## 2017-02-12 DIAGNOSIS — Z96651 Presence of right artificial knee joint: Secondary | ICD-10-CM

## 2017-02-12 DIAGNOSIS — R600 Localized edema: Secondary | ICD-10-CM

## 2017-02-12 DIAGNOSIS — M25661 Stiffness of right knee, not elsewhere classified: Secondary | ICD-10-CM | POA: Diagnosis not present

## 2017-02-12 DIAGNOSIS — R262 Difficulty in walking, not elsewhere classified: Secondary | ICD-10-CM

## 2017-02-12 DIAGNOSIS — M25561 Pain in right knee: Secondary | ICD-10-CM

## 2017-02-12 NOTE — Therapy (Signed)
[place], [place], [address] Phone: [phone]   Fax:  [phone]  Physical Therapy Treatment  Patient Details  Name: Janet Le MRN: [id number] Date of Birth: December 06, 1955 Referring Provider: Bonnell Public, MD  Encounter Date: 02/12/2017      PT End of Session - 02/12/17 1348    Visit Number 14   Number of Visits 24   Date for PT Re-Evaluation 03/01/17   Authorization Type BCBS   PT Start Time 1333   PT Stop Time 1420   PT Time Calculation (min) 47 min   Activity Tolerance Patient tolerated treatment well   Behavior During Therapy [facility] for tasks assessed/performed      Past Medical History:  Diagnosis Date  . Allergic rhinitis, cause unspecified   . Anxiety   . Blood dyscrasia    platelets low in past  . Chronic back pain   . Chronic constipation   . Chronic sinusitis   . Depression   . Endometriosis   . ENDOMETRIOSIS 05/06/2009   Qualifier: Diagnosis of  By: Varney Daily RN, Butch Penny    . Esophageal stricture   . GERD (gastroesophageal reflux disease)   . Hemorrhoids   . Hiatal hernia   . Hyperlipidemia   . Hypertension   . Irritable bowel syndrome   . Osteoarthritis   . Personal history of diabetes mellitus    controlled with diet only  . Renal cell cancer ([place]) 11/2010 dx   R, s/p cryoablation 02/16/11  . Syncopal episodes    since childhood    Past Surgical History:  Procedure Laterality Date  . BREAST SURGERY     bilateral, cyst removal  . fallopian tube removed    . FUNCTIONAL ENDOSCOPIC SINUS SURGERY    . LASER ABLATION OF THE CERVIX    . 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 TOTAL KNEE ARTHROPLASTY Right 10/30/2016   Procedure: RIGHT TOTAL KNEE ARTHROPLASTY;  Surgeon: Meredith Pel, MD;  Location: [facility];  Service: Orthopedics;  Laterality: Right;  . TUBAL LIGATION      There were no vitals filed for this visit.      Subjective  Assessment - 02/12/17 1343    Subjective Pt reporting no pain today. Only soreness with bending.   Pertinent History neck and back pain , pain into shoulders.    Limitations Standing;Walking;House hold activities   How long can you stand comfortably? not sure   How long can you walk comfortably? Not sure.    Patient Stated Goals She wants to be independent with home tasks.     Currently in Pain? No/denies            [facility] PT Assessment - 02/12/17 0001      AROM   Right Knee Extension 0   Right Knee Flexion 115     PROM   Right Knee Flexion 120                     [facility] Adult PT Treatment/Exercise - 02/12/17 0001      Ambulation/Gait   Gait Comments side stepping x 30 feet, x 2     Knee/Hip Exercises: Stretches   Knee: Self-Stretch to increase Flexion Right;3 reps;30 seconds     Knee/Hip Exercises: Aerobic   Nustep L5, 7 minutes     Knee/Hip Exercises: Standing   Heel Raises Limitations 25 reps   Knee Flexion  Right;20 reps   Knee Flexion Limitations 8 pounds   Wall Squat Limitations 20 reps   Rocker Board 3 minutes     Knee/Hip Exercises: Seated   Long Arc Quad Limitations 5 sec hold, 30 rps   Other Seated Knee/Hip Exercises reaching  oup facing wall with weight to RT leg with cues to contract glutes and quads to balance on RT leg x 15 , caught pt x1 due to LOB from fatigue.      Vasopneumatic   Number Minutes Vasopneumatic  15 minutes   Vasopnuematic Location  Knee   Vasopneumatic Pressure Medium   Vasopneumatic Temperature  38     Manual Therapy   Manual therapy comments cross friction massage over incision site, patella mobs                PT Education - 02/12/17 1343    Education provided Yes   Education Details Reviewed HEP   Person(s) Educated Patient   Methods Explanation;Demonstration   Comprehension Verbalized understanding;Returned demonstration          PT Short Term Goals - 01/29/17 1412      PT SHORT TERM GOAL #4    Title She will report decr pain 50% with activity on feet.    Status Achieved           PT Long Term Goals - 02/12/17 1432      PT LONG TERM GOAL #1   Title She will be independent with all HEP issued   Status On-going     PT LONG TERM GOAL #2   Title She will report pain as intermitant    Status Achieved     PT LONG TERM GOAL #3   Title She will return to normal home tasks with 1-2 max pain   Baseline 4/10 general pain   Status On-going     PT LONG TERM GOAL #4   Title She will walk without device in and out of home   Baseline using SPC   Status On-going     PT LONG TERM GOAL #5   Title She will improve hip abduction strength to 4/5 or better to imptrove stability on feet.    Baseline 4/5 today   Time 12   Period Weeks   Status Achieved     PT LONG TERM GOAL #6   Title FOTO score improved to < 40% limited   Status Unable to assess               Plan - 02/12/17 1430    Clinical Impression Statement Pt arriving to therapy reporting no pain only soreness with bending. Pt tolerated treatment well focusing on LE strengthening. Pt was instructed in cross friction massage for her incision site and recommended continue her HEP with ice and elevation for swelling. Continue with skilled PT to progress toward goals set.    Rehab Potential Good   PT Frequency 2x / week   PT Duration 12 weeks   PT Treatment/Interventions Cryotherapy;Gait training;Stair training;Passive range of motion;Patient/family education;Therapeutic exercise;Balance training;Therapeutic activities;Manual techniques;Taping   PT Next Visit Plan strength of hips and knees , gait,  flexion stretching if needed, balance,  gait without  SPC  iincr weight to 10 pounds,        PT Home Exercise Plan HHPT HEP including quad set, LAQ, heel slides, hamstring curls, heel raises, sit-stand, added step up and calf stretch , side and forward touching on non stance foot  Consulted and Agree with Plan of Care  Patient      Patient will benefit from skilled therapeutic intervention in order to improve the following deficits and impairments:  Pain, Decreased activity tolerance, Decreased strength, Increased edema, Decreased range of motion, Difficulty walking, Increased muscle spasms  Visit Diagnosis: History of total knee arthroplasty, right  Stiffness of right knee, not elsewhere classified  Localized edema  Right knee pain, unspecified chronicity  Difficulty in walking, not elsewhere classified     Problem List Patient Active Problem List   Diagnosis Date Noted  . Ulcer of right lower extremity ([place]) 11/29/2016  . Arthritis of knee 10/30/2016  . S/P knee surgery 10/30/2016  . Burning pain 10/15/2016  . Low back pain 08/01/2016  . Chronic low back pain 07/18/2016  . Prediabetes 05/30/2016  . Cervicalgia 05/30/2016  . Right knee pain 05/30/2016  . Osteopenia 11/18/2015  . Cephalalgia 07/20/2015  . Thrombocytopenia ([place]) 05/30/2015  . Arthritis of left lower extremity 02/02/2015  . Left knee pain 09/08/2014  . Gout 12/11/2013  . Vision disturbance 12/08/2013  . Severe obesity (BMI >= 40) ([place]) 11/06/2013  . Osteoporosis   . Left lumbar radiculopathy 04/13/2012  . Renal cell cancer ([place])   . External hemorrhoid 11/14/2010  . IBS (irritable bowel syndrome) 11/14/2010  . GERD (gastroesophageal reflux disease) 11/14/2010  . HYPERCHOLESTEROLEMIA, MILD 03/16/2010  . Lymphedema 03/15/2010  . ALLERGIC RHINITIS 08/31/2009  . MIGRAINE HEADACHE 06/14/2009  . SINUSITIS, CHRONIC 06/14/2009  . SYNCOPE 05/16/2009  . Essential hypertension 05/06/2009  . CONSTIPATION, CHRONIC 05/06/2009    Oretha Caprice, MPT 02/12/2017, 2:34 PM  [facility] [address] [place], [place], [address] Phone: [phone]   Fax:  [phone]  Name: Janet Le MRN: [id number] Date of Birth: 1956-06-06

## 2017-02-14 NOTE — Addendum Note (Signed)
Addendum  created 02/14/17 1412 by Roberts Gaudy, MD   Sign clinical note

## 2017-02-19 ENCOUNTER — Ambulatory Visit: Payer: BLUE CROSS/BLUE SHIELD | Admitting: Physical Therapy

## 2017-02-21 ENCOUNTER — Ambulatory Visit: Payer: BLUE CROSS/BLUE SHIELD | Admitting: Physical Therapy

## 2017-02-26 NOTE — Progress Notes (Signed)
[place]  [phone] [phone]  Clinic follow Up Note   Patient Care Team: Binnie Rail, MD as PCP - General (Internal Medicine) Sable Feil, MD as Consulting Physician (Gastroenterology) Larey Dresser, MD as Consulting Physician (Cardiology) Izora Gala, MD as Consulting Physician (Otolaryngology) Jeannett Senior as Physician Assistant (Orthopedic Surgery) Juanda Chance, NP as Nurse Practitioner (Obstetrics and Gynecology) Franchot Gallo, MD as Consulting Physician (Urology) Aletta Edouard, MD as Consulting Physician (Radiology) Sharol Roussel, [place] (Optometry) Pieter Partridge, DO (Neurology) Charlett Blake, MD (Physical Medicine and Rehabilitation) 02/28/2017   CHIEF COMPLAINTS:  Follow Up thrombocytopenia   HISTORY OF PRESENTING ILLNESS:  Janet Le 61 y.o. female with past medical history of hypertension, irritable bowel syndrome, renal cell carcinoma, is here because of chronic mild thrombocytopenia.   She was found to have a platelet count 143 on her recent routine lab test by her gynecologist. She is not aware if she had low platelet count before. According to her lab test result in our EPIC system, she has had intermittent mild thrombocytopenia for the past 6 months, with platelet in the range of 120-150K.  She denied any bleeding episodes including hematochezia, melana, hemoptysis, hematuria or epitaxis. No mucosal bleeding or easy bruising. She reports occasional bruise on her arms.  She has chronic constipation from her irritable bowel syndrome. She is on Linzess. She also has chronic acid reflux, was on Nexium before, and has been on Protonix for the past several years. She denies any new medications recently. She feels well in general, has mild fatigue, able to function very well, has intermittent low back pain and knee pain after long distance walking. She otherwise denies any other significant pain,  dyspnea, fever, chills, night sweats, or other symptoms.  CURRENT THERAPY: observation   INTERIM HISTORY [name] returns for follow-up. She was last seen by me in June 2017 for mild thrombocytopenia. She is clinically doing very well, denies any signs of bleeding, easy bruising. No other new complaints.  MEDICAL HISTORY:  Past Medical History:  Diagnosis Date  . Allergic rhinitis, cause unspecified   . Anxiety   . Blood dyscrasia    platelets low in past  . Chronic back pain   . Chronic constipation   . Chronic sinusitis   . Depression   . Endometriosis   . ENDOMETRIOSIS 05/06/2009   Qualifier: Diagnosis of  By: Varney Daily RN, Butch Penny    . Esophageal stricture   . GERD (gastroesophageal reflux disease)   . Hemorrhoids   . Hiatal hernia   . Hyperlipidemia   . Hypertension   . Irritable bowel syndrome   . Osteoarthritis   . Personal history of diabetes mellitus    controlled with diet only  . Renal cell cancer ([place]) 11/2010 dx   R, s/p cryoablation 02/16/11  . Syncopal episodes    since childhood    SURGICAL HISTORY: Past Surgical History:  Procedure Laterality Date  . BREAST SURGERY     bilateral, cyst removal  . fallopian tube removed    . FUNCTIONAL ENDOSCOPIC SINUS SURGERY    . LASER ABLATION OF THE CERVIX    . 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 TOTAL KNEE ARTHROPLASTY Right 10/30/2016   Procedure: RIGHT TOTAL KNEE ARTHROPLASTY;  Surgeon: Meredith Pel, MD;  Location: [facility];  Service: Orthopedics;  Laterality: Right;  . TUBAL LIGATION      SOCIAL HISTORY: Social  History   Social History  . Marital status: Married    Spouse name: N/A  . Number of children: N/A  . Years of education: N/A   Occupational History  . 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 Smoking status: Never Smoker  . Smokeless tobacco: Never Used  . Alcohol use No     Comment: rare  . Drug use: No  .  Sexual activity: No   Other Topics Concern  . Not on file   Social History Narrative  . No narrative on file    FAMILY HISTORY: Family History  Problem Relation Age of Onset  . Diabetes Mother   . Kidney disease Father   . Prostate cancer Father   . Colitis Brother   . Leukemia Brother   . Multiple sclerosis Sister   . Colon polyps Unknown   . Colon cancer Neg Hx     ALLERGIES:  is allergic to sertraline hcl; ace inhibitors; codeine; darifenacin hydrobromide er; gabapentin; pravastatin; propoxyphene hcl; and wellbutrin [bupropion hcl].  MEDICATIONS:  Current Outpatient Prescriptions  Medication Sig Dispense Refill  . ACCU-CHEK SOFTCLIX LANCETS lancets by Other route. Use twice a day for blood sugar     . alendronate (FOSAMAX) 70 MG tablet Take 1 tablet (70 mg total) by mouth once a week. Take with a full glass of water on an empty stomach. 4 tablet 0  . amLODipine (NORVASC) 5 MG tablet Take 1 tablet (5 mg total) by mouth daily. 90 tablet 3  . atorvastatin (LIPITOR) 10 MG tablet TAKE 1 TABLET DAILY 90 tablet 3  . calcium-vitamin D (OSCAL WITH D) 500-200 MG-UNIT tablet Take 1 tablet by mouth daily.     . Diclofenac Sodium 2 % SOLN Apply 1 pump twice daily. (Patient taking differently: Apply 1 application topically 2 (two) times daily as needed (PAIN). Apply 1 pump twice daily.) 112 g 1  . fluticasone (FLONASE) 50 MCG/ACT nasal spray Place 1 spray into both nostrils daily. 16 g 2  . furosemide (LASIX) 20 MG tablet Take 1 tablet (20 mg total) by mouth daily. 30 tablet 0  . glucose blood (ONETOUCH VERIO) test strip Use one strip to check blood sugar daily 100 each 4  . HYDROmorphone (DILAUDID) 2 MG tablet Take 1 tablet (2 mg total) by mouth every 4 (four) hours as needed for severe pain. 45 tablet 0  . Insulin Pen Needle (NOVOFINE) 32G X 6 MM MISC Use daily with Saxenda 100 each 1  . Linaclotide (LINZESS) 145 MCG CAPS capsule Take 1 capsule (145 mcg total) by mouth daily. (Patient  taking differently: Take 145 mcg by mouth daily as needed (CONSTIPATION). ) 90 capsule 1  . Liraglutide -Weight Management (SAXENDA) 18 MG/3ML SOPN Inject 0.6 mg into the skin daily. For one week, then increase 0.6 mg weekly until you reach 3 mg daily 15 pen 1  . losartan (COZAAR) 100 MG tablet Take 1 tablet (100 mg total) by mouth daily. 30 tablet 0  . methocarbamol (ROBAXIN) 500 MG tablet Take 1 tablet (500 mg total) by mouth every 6 (six) hours as needed for muscle spasms. 30 tablet 2  . NONFORMULARY OR COMPOUNDED ITEM [facility]  Onychomycosis Nail Lacquer -  Fluconazole 2%, Terbinafine 1% DMSO Apply to affected nail once daily Qty. 120 gm 3 refills 1 each 2  . nystatin (MYCOSTATIN) 100000 UNIT/ML suspension Take 5 mLs by mouth 3 (three) times  daily. SWISH AND SWALLOW    . ondansetron (ZOFRAN) 4 MG tablet Take 1-2 tablets (4-8 mg total) by mouth every 8 (eight) hours as needed for nausea or vomiting. 40 tablet 0  . ONETOUCH DELICA LANCETS 93A MISC 1 each by Does not apply route daily. Use to help check blood sugars once daily Dx 250.00 100 each 3  . oxyCODONE (OXY IR/ROXICODONE) 5 MG immediate release tablet Take 1 tablet (5 mg total) by mouth every 6 (six) hours as needed for severe pain. 60 tablet 0  . pregabalin (LYRICA) 50 MG capsule Take 1 capsule (50 mg total) by mouth 3 (three) times daily. 504 capsule 0  . Prenatal Vit-Fe Fumarate-FA (PNV PRENATAL PLUS MULTIVITAMIN) 27-1 MG TABS TAKE 1 TABLET DAILY AT 12 NOON 60 tablet 3  . propranolol (INDERAL) 80 MG tablet Take 1 tablet (80 mg total) by mouth 2 (two) times daily. 60 tablet 0  . terbinafine (LAMISIL) 250 MG tablet     . zolpidem (AMBIEN) 5 MG tablet Take 1 tablet (5 mg total) by mouth at bedtime as needed for sleep. 30 tablet 0   No current facility-administered medications for this visit.     REVIEW OF SYSTEMS:   Constitutional: Denies fevers, chills or abnormal night sweats Eyes: Denies blurriness of vision, double  vision or watery eyes Ears, nose, mouth, throat, and face: Denies mucositis or sore throat Respiratory: Denies cough, dyspnea or wheezes Cardiovascular: Denies palpitation, chest discomfort or lower extremity swelling Gastrointestinal:  Denies nausea, heartburn or change in bowel habits Skin: Denies abnormal skin rashes Lymphatics: Denies new lymphadenopathy or easy bruising Neurological:Denies numbness, tingling or new weaknesses Behavioral/Psych: Mood is stable, no new changes  All other systems were reviewed with the patient and are negative.  PHYSICAL EXAMINATION:  ECOG PERFORMANCE STATUS: 0 - Asymptomatic  Vitals:   02/28/17 1207  BP: 139/76  Pulse: 67  Resp: 18  Temp: 98.1 F (36.7 C)  SpO2: 98%   Filed Weights   02/28/17 1207  Weight: 228 lb (103.4 kg)    GENERAL:alert, no distress and comfortable SKIN: skin color, texture, turgor are normal, no rashes or significant lesions, no petechia or ecchymosis EYES: normal, conjunctiva are pink and non-injected, sclera clear OROPHARYNX:no exudate, no erythema and lips, buccal mucosa, and tongue normal  NECK: supple, thyroid normal size, non-tender, without nodularity LYMPH:  no palpable lymphadenopathy in the cervical, axillary or inguinal LUNGS: clear to auscultation and percussion with normal breathing effort HEART: regular rate & rhythm and no murmurs and no lower extremity edema ABDOMEN:abdomen soft, non-tender and normal bowel sounds Musculoskeletal:no cyanosis of digits and no clubbing  PSYCH: alert & oriented x 3 with fluent speech NEURO: no focal motor/sensory deficits  LABORATORY DATA:  I have reviewed the data as listed CBC Latest Ref Rng & Units 02/28/2017 10/18/2016 10/15/2016  WBC 3.9 - 10.3 10e3/uL 5.7 8.2 7.4  Hemoglobin 11.6 - 15.9 g/dL 11.9 12.7 12.6  Hematocrit 34.8 - 46.6 % 37.0 38.9 38.5  Platelets 145 - 400 10e3/uL 114(L) 144(L) 130.0(L)    CMP Latest Ref Rng & Units 02/28/2017 10/30/2016 10/15/2016    Glucose 70 - 140 mg/dl 80 82 89  BUN 7.0 - 26.0 mg/dL 9.0 15 12  Creatinine 0.6 - 1.1 mg/dL 0.9 0.83 0.87  Sodium 136 - 145 mEq/L 141 140 143  Potassium 3.5 - 5.1 mEq/L 4.0 3.9 3.9  Chloride 101 - 111 mmol/L - 109 107  CO2 22 - 29 mEq/L 28 26 31  Calcium 8.4 - 10.4 mg/dL 10.1 9.8 10.5  Total Protein 6.4 - 8.3 g/dL 7.3 - 7.3  Total Bilirubin 0.20 - 1.20 mg/dL 0.27 - 0.4  Alkaline Phos 40 - 150 U/L 92 - 71  AST 5 - 34 U/L 18 - 18  ALT 0 - 55 U/L 17 - 18     RADIOGRAPHIC STUDIES: I have personally reviewed the radiological images as listed and agreed with the findings in the report. No results found.  ASSESSMENT & PLAN: 61 year old African-American female, with past medical history of hypertension, irritable bowel syndrome, GERD, history of renal cell carcinoma status post cryoablation, presented with chronic mild thrombocytopenia or more than 6 years.  1. 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  -I discussed the common etiology for chronic mild thrombocytopenia, which include autoimmune related (ITP), liver disease, splenomegaly, medication or alcohol induced, chronic infection such as hepatitis C and HIV, and bone marrow disease such as MDS. The other etiology such as infection, malignancy, microangiopathy, DIC a less likely given the indolent course and his clinical presentation. -I reviewed her medication list and suggest him to stop protonix,  which can induce thrombocytopenia.  -She does not have history of hepatitis, liver functions normal, recent CT of abdomen showed normal appearance of liver and spleen. HIV was checked which was negative - ANA was negative and SER was normal, methylmalonic acid level was elevated, indicating mild B12 deficiency. She is on prenatal vitamin which contains folic acid and I94. 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 Given her mild  thrombocytopenia, she does not need any treatment for now, but her blood counts need to be monitored -We discussed the risk of bleeding from thrombocytopenia. Giving the mild degree of thrombocytopenia, the risk of bleeding is pretty low. But she knows to avoid injury.  -Her platelet count has been very stable over several years, I recommend her to f/u with h  2. history of RCC, status post cryoablation -her recent CT scan showed no evidence of recurrence -She follows up with interventional radiologist Dr. Kathlene Cote  3. Hypertension, IBS,  -She will continue follow-up with her primary care physician and GI   Plan -Lab results reviewed with patient, I given her a copy, including her previous lab results. -Given her stable mild thrombocytopenia over several years, I recommend her to f/u with her PCP Dr. Quay Burow, and I will see her as needed   All questions were answered. The patient knows to call the clinic with any problems, questions or concerns. I spent 10  minutes counseling the patient face to face. The total time spent in the appointment was 15 minutes and more than 50% was on counseling.     Truitt Merle, MD 02/28/2017  This document serves as a record of services personally performed by Truitt Merle, MD. It was created on her behalf by Steva Colder, a trained medical scribe. The creation of this record is based on the scribe's personal observations and the provider's statements to them. This document has been checked and approved by the attending provider.

## 2017-02-28 ENCOUNTER — Ambulatory Visit (HOSPITAL_BASED_OUTPATIENT_CLINIC_OR_DEPARTMENT_OTHER): Payer: BLUE CROSS/BLUE SHIELD | Admitting: Hematology

## 2017-02-28 ENCOUNTER — Other Ambulatory Visit (HOSPITAL_BASED_OUTPATIENT_CLINIC_OR_DEPARTMENT_OTHER): Payer: BLUE CROSS/BLUE SHIELD

## 2017-02-28 VITALS — BP 139/76 | HR 67 | Temp 98.1°F | Resp 18 | Ht 61.5 in | Wt 228.0 lb

## 2017-02-28 DIAGNOSIS — K589 Irritable bowel syndrome without diarrhea: Secondary | ICD-10-CM | POA: Diagnosis not present

## 2017-02-28 DIAGNOSIS — I10 Essential (primary) hypertension: Secondary | ICD-10-CM | POA: Diagnosis not present

## 2017-02-28 DIAGNOSIS — D696 Thrombocytopenia, unspecified: Secondary | ICD-10-CM

## 2017-02-28 LAB — CBC & DIFF AND RETIC
BASO%: 0.2 % (ref 0.0–2.0)
Basophils Absolute: 0 10*3/uL (ref 0.0–0.1)
EOS%: 1.8 % (ref 0.0–7.0)
Eosinophils Absolute: 0.1 10*3/uL (ref 0.0–0.5)
HCT: 37 % (ref 34.8–46.6)
HGB: 11.9 g/dL (ref 11.6–15.9)
Immature Retic Fract: 7.9 % (ref 1.60–10.00)
LYMPH%: 30.8 % (ref 14.0–49.7)
MCH: 30.5 pg (ref 25.1–34.0)
MCHC: 32.2 g/dL (ref 31.5–36.0)
MCV: 94.9 fL (ref 79.5–101.0)
MONO#: 0.5 10*3/uL (ref 0.1–0.9)
MONO%: 8.6 % (ref 0.0–14.0)
NEUT#: 3.3 10*3/uL (ref 1.5–6.5)
NEUT%: 58.6 % (ref 38.4–76.8)
Platelets: 114 10*3/uL — ABNORMAL LOW (ref 145–400)
RBC: 3.9 10*6/uL (ref 3.70–5.45)
RDW: 15.5 % — ABNORMAL HIGH (ref 11.2–14.5)
Retic %: 1.17 % (ref 0.70–2.10)
Retic Ct Abs: 45.63 10*3/uL (ref 33.70–90.70)
WBC: 5.7 10*3/uL (ref 3.9–10.3)
lymph#: 1.8 10*3/uL (ref 0.9–3.3)

## 2017-02-28 LAB — COMPREHENSIVE METABOLIC PANEL
ALT: 17 U/L (ref 0–55)
AST: 18 U/L (ref 5–34)
Albumin: 3.4 g/dL — ABNORMAL LOW (ref 3.5–5.0)
Alkaline Phosphatase: 92 U/L (ref 40–150)
Anion Gap: 5 mEq/L (ref 3–11)
BUN: 9 mg/dL (ref 7.0–26.0)
CO2: 28 mEq/L (ref 22–29)
Calcium: 10.1 mg/dL (ref 8.4–10.4)
Chloride: 108 mEq/L (ref 98–109)
Creatinine: 0.9 mg/dL (ref 0.6–1.1)
EGFR: 82 mL/min/{1.73_m2} — ABNORMAL LOW (ref >90)
Glucose: 80 mg/dl (ref 70–140)
Potassium: 4 mEq/L (ref 3.5–5.1)
Sodium: 141 mEq/L (ref 136–145)
Total Bilirubin: 0.27 mg/dL (ref 0.20–1.20)
Total Protein: 7.3 g/dL (ref 6.4–8.3)

## 2017-03-02 ENCOUNTER — Encounter: Payer: Self-pay | Admitting: Hematology

## 2017-03-06 ENCOUNTER — Telehealth: Payer: Self-pay | Admitting: Internal Medicine

## 2017-03-06 MED ORDER — LIRAGLUTIDE -WEIGHT MANAGEMENT 18 MG/3ML ~~LOC~~ SOPN: 3.0000 mg | PEN_INJECTOR | Freq: Every day | SUBCUTANEOUS | 3 refills | Status: DC

## 2017-03-06 MED ORDER — ZOSTER VAC RECOMB ADJUVANTED 50 MCG/0.5ML IM SUSR: 0.5000 mL | Freq: Once | INTRAMUSCULAR | 1 refills | Status: AC

## 2017-03-06 NOTE — Telephone Encounter (Signed)
Both sent 

## 2017-03-06 NOTE — Telephone Encounter (Signed)
Pt called and would like a refill of her  Liraglutide -Weight Management (SAXENDA) 18 MG/3ML SOPN  She would like it sent to [facility] on file  She would also like to receive the shingrix vaccine.

## 2017-03-12 ENCOUNTER — Telehealth: Payer: Self-pay | Admitting: Internal Medicine

## 2017-03-12 MED ORDER — ATORVASTATIN CALCIUM 10 MG PO TABS: 10.0000 mg | ORAL_TABLET | Freq: Every day | ORAL | 2 refills | Status: DC

## 2017-03-12 MED ORDER — LOSARTAN POTASSIUM 100 MG PO TABS: 100.0000 mg | ORAL_TABLET | Freq: Every day | ORAL | 2 refills | Status: DC

## 2017-03-12 NOTE — Addendum Note (Signed)
Addended by: Earnstine Regal on: 03/12/2017 10:31 AM   Modules accepted: Orders

## 2017-03-12 NOTE — Telephone Encounter (Signed)
Pt need called in and needs refill on  Atorvastatin  Furosemide  [place] [address]

## 2017-03-12 NOTE — Telephone Encounter (Signed)
Pharmacy has already sent request refill has been approved and sent bck to [facility]...Janet Le

## 2017-03-18 ENCOUNTER — Ambulatory Visit: Payer: BLUE CROSS/BLUE SHIELD

## 2017-03-25 ENCOUNTER — Encounter: Payer: Self-pay | Admitting: Physical Therapy

## 2017-03-25 ENCOUNTER — Ambulatory Visit: Payer: BLUE CROSS/BLUE SHIELD | Attending: Internal Medicine | Admitting: Physical Therapy

## 2017-03-25 DIAGNOSIS — R262 Difficulty in walking, not elsewhere classified: Secondary | ICD-10-CM | POA: Insufficient documentation

## 2017-03-25 DIAGNOSIS — M25661 Stiffness of right knee, not elsewhere classified: Secondary | ICD-10-CM | POA: Diagnosis not present

## 2017-03-25 DIAGNOSIS — R600 Localized edema: Secondary | ICD-10-CM

## 2017-03-25 DIAGNOSIS — M25561 Pain in right knee: Secondary | ICD-10-CM

## 2017-03-25 DIAGNOSIS — Z96651 Presence of right artificial knee joint: Secondary | ICD-10-CM | POA: Diagnosis not present

## 2017-03-25 NOTE — Therapy (Signed)
[place], [place], [address] Phone: [phone]   Fax:  [phone]  Physical Therapy Treatment / Re-certification  Patient Details  Name: Janet Le MRN: [id number] Date of Birth: 10/14/1955 Referring Provider: Bonnell Public, MD  Encounter Date: 03/25/2017      PT End of Session - 03/25/17 1352    Visit Number 15   Number of Visits 24   Date for PT Re-Evaluation 05/06/17   PT Start Time 1331   PT Stop Time 1413   PT Time Calculation (min) 42 min   Activity Tolerance Patient tolerated treatment well   Behavior During Therapy [facility] for tasks assessed/performed      Past Medical History:  Diagnosis Date  . Allergic rhinitis, cause unspecified   . Anxiety   . Blood dyscrasia    platelets low in past  . Chronic back pain   . Chronic constipation   . Chronic sinusitis   . Depression   . Endometriosis   . ENDOMETRIOSIS 05/06/2009   Qualifier: Diagnosis of  By: Varney Daily RN, Butch Penny    . Esophageal stricture   . GERD (gastroesophageal reflux disease)   . Hemorrhoids   . Hiatal hernia   . Hyperlipidemia   . Hypertension   . Irritable bowel syndrome   . Osteoarthritis   . Personal history of diabetes mellitus    controlled with diet only  . Renal cell cancer ([place]) 11/2010 dx   R, s/p cryoablation 02/16/11  . Syncopal episodes    since childhood    Past Surgical History:  Procedure Laterality Date  . BREAST SURGERY     bilateral, cyst removal  . fallopian tube removed    . FUNCTIONAL ENDOSCOPIC SINUS SURGERY    . LASER ABLATION OF THE CERVIX    . 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 TOTAL KNEE ARTHROPLASTY Right 10/30/2016   Procedure: RIGHT TOTAL KNEE ARTHROPLASTY;  Surgeon: Meredith Pel, MD;  Location: [place];  Service: Orthopedics;  Laterality: Right;  . TUBAL LIGATION      There were no vitals filed for this visit.      Subjective Assessment -  03/25/17 1332    Subjective "I am still having pain in the incision and on the inside and outside of the knee"   Currently in Pain? No/denies  touching the incision,   Pain Onset More than a month ago   Pain Frequency Intermittent   Aggravating Factors  touching incision, kneeling onto the knee   Pain Relieving Factors getting out of the position.             [facility] PT Assessment - 03/25/17 1337      Observation/Other Assessments   Focus on Therapeutic Outcomes (FOTO)  52% limited     AROM   Right Knee Extension 0   Right Knee Flexion 117     Strength   Right Knee Flexion 4+/5   Right Knee Extension 4+/5                     [facility] Adult PT Treatment/Exercise - 03/25/17 1402      Knee/Hip Exercises: Stretches   Hip Flexor Stretch 2 reps;30 seconds     Knee/Hip Exercises: Aerobic   Recumbent Bike L1 x 6 min  lowering seat every 2 min     Knee/Hip Exercises: Standing   Heel Raises 2 sets;20 reps  Hip Abduction Right;Left;2 sets;10 reps     Manual Therapy   Manual therapy comments cross friction massage over incision site, patella mobs.  desitization techniques over the knee                 PT Education - 03/25/17 1417    Education provided Yes   Education Details update HEP and reviewed previously provided HEP, updated POC   Person(s) Educated Patient   Methods Explanation;Verbal cues;Handout   Comprehension Verbalized understanding;Verbal cues required          PT Short Term Goals - 03/25/17 1428      PT SHORT TERM GOAL #1   Title She will be independent with inital HEP   Time 4   Period Weeks   Status Achieved     PT SHORT TERM GOAL #2   Title She will improve active flexion to 115 degrees   Time 4   Period Weeks   Status Achieved     PT SHORT TERM GOAL #3   Title she will be walking in home without device   Time 4   Status Achieved     PT SHORT TERM GOAL #4   Title She will report decr pain 50% with activity on feet.     Time 4   Period Weeks           PT Long Term Goals - 03/25/17 1429      PT LONG TERM GOAL #1   Title She will be independent with all HEP issued   Time 12   Period Weeks   Status On-going     PT LONG TERM GOAL #2   Title She will report pain as intermitant    Time 12   Period Weeks   Status Achieved     PT LONG TERM GOAL #3   Title She will return to normal home tasks with 1-2 max pain   Time 12   Period Weeks   Status Partially Met     PT LONG TERM GOAL #4   Title She will walk without device in and out of home   Time 12   Period Weeks   Status On-going     PT LONG TERM GOAL #5   Title She will improve hip abduction strength to 4/5 or better to imptrove stability on feet.    Baseline 4/5 today   Time 12   Period Weeks   Status Achieved     PT LONG TERM GOAL #6   Title FOTO score improved to < 40% limited   Time 12   Period Weeks   Status Partially Met               Plan - 03/25/17 1424    Clinical Impression Statement pt reports she hasn't been able to come to PT due issues with her family. She continues to demonstrate 0-113 with R knee mobility. pt continued to report sorness at the the incision and around the knee. She was able to do all exercise and improved knee flexion to 117 degrees. she would benefit from continued physical therapy to improve knee mobility, endurance training, and balance for safety.    Rehab Potential Good   PT Frequency 2x / week   PT Duration 4 weeks   PT Treatment/Interventions Cryotherapy;Gait training;Stair training;Passive range of motion;Patient/family education;Therapeutic exercise;Balance training;Therapeutic activities;Manual techniques;Taping   PT Next Visit Plan strength of hips and knees , gait,  flexion stretching if needed, balance,  gait without  SPC , balance training.    PT Home Exercise Plan HHPT HEP including quad set, LAQ, heel slides, hamstring curls, heel raises, sit-stand, added step up and calf stretch ,  side and forward touching on non stance foot     Consulted and Agree with Plan of Care Patient      Patient will benefit from skilled therapeutic intervention in order to improve the following deficits and impairments:  Pain, Decreased activity tolerance, Decreased strength, Increased edema, Decreased range of motion, Difficulty walking, Increased muscle spasms  Visit Diagnosis: History of total knee arthroplasty, right - Plan: PT plan of care cert/re-cert  Stiffness of right knee, not elsewhere classified - Plan: PT plan of care cert/re-cert  Localized edema - Plan: PT plan of care cert/re-cert  Right knee pain, unspecified chronicity - Plan: PT plan of care cert/re-cert  Difficulty in walking, not elsewhere classified - Plan: PT plan of care cert/re-cert     Problem List Patient Active Problem List   Diagnosis Date Noted  . Ulcer of right lower extremity ([place]) 11/29/2016  . Arthritis of knee 10/30/2016  . S/P knee surgery 10/30/2016  . Burning pain 10/15/2016  . Low back pain 08/01/2016  . Chronic low back pain 07/18/2016  . Prediabetes 05/30/2016  . Cervicalgia 05/30/2016  . Right knee pain 05/30/2016  . Osteopenia 11/18/2015  . Cephalalgia 07/20/2015  . Thrombocytopenia ([place]) 05/30/2015  . Arthritis of left lower extremity 02/02/2015  . Left knee pain 09/08/2014  . Gout 12/11/2013  . Vision disturbance 12/08/2013  . Severe obesity (BMI >= 40) ([place]) 11/06/2013  . Osteoporosis   . Left lumbar radiculopathy 04/13/2012  . Renal cell cancer ([place])   . External hemorrhoid 11/14/2010  . IBS (irritable bowel syndrome) 11/14/2010  . GERD (gastroesophageal reflux disease) 11/14/2010  . HYPERCHOLESTEROLEMIA, MILD 03/16/2010  . Lymphedema 03/15/2010  . ALLERGIC RHINITIS 08/31/2009  . MIGRAINE HEADACHE 06/14/2009  . SINUSITIS, CHRONIC 06/14/2009  . SYNCOPE 05/16/2009  . Essential hypertension 05/06/2009  . CONSTIPATION, CHRONIC 05/06/2009   Starr Lake PT, DPT,  LAT, ATC  03/25/17  2:34 PM      [facility] [address] [place], [place], [address] Phone: [phone]   Fax:  [phone]  Name: SADEY YANDELL MRN: [id number] Date of Birth: [birth year]

## 2017-03-28 ENCOUNTER — Ambulatory Visit: Payer: BLUE CROSS/BLUE SHIELD | Admitting: Physical Therapy

## 2017-03-28 DIAGNOSIS — Z96651 Presence of right artificial knee joint: Secondary | ICD-10-CM

## 2017-03-28 DIAGNOSIS — R600 Localized edema: Secondary | ICD-10-CM

## 2017-03-28 DIAGNOSIS — R262 Difficulty in walking, not elsewhere classified: Secondary | ICD-10-CM

## 2017-03-28 DIAGNOSIS — M25561 Pain in right knee: Secondary | ICD-10-CM

## 2017-03-28 DIAGNOSIS — M25661 Stiffness of right knee, not elsewhere classified: Secondary | ICD-10-CM

## 2017-03-28 NOTE — Therapy (Signed)
[place], [place], [address] Phone: [phone]   Fax:  [phone]  Physical Therapy Treatment  Patient Details  Name: Janet Le MRN: [id number] Date of Birth: 28-Feb-1956 Referring Provider: Bonnell Public, MD  Encounter Date: 03/28/2017      PT End of Session - 03/28/17 1243    Visit Number 16   Number of Visits 24   Date for PT Re-Evaluation 05/06/17   PT Start Time 1150   PT Stop Time 1230   PT Time Calculation (min) 40 min   Activity Tolerance Patient tolerated treatment well   Behavior During Therapy [facility] for tasks assessed/performed      Past Medical History:  Diagnosis Date  . Allergic rhinitis, cause unspecified   . Anxiety   . Blood dyscrasia    platelets low in past  . Chronic back pain   . Chronic constipation   . Chronic sinusitis   . Depression   . Endometriosis   . ENDOMETRIOSIS 05/06/2009   Qualifier: Diagnosis of  By: Varney Daily RN, Butch Penny    . Esophageal stricture   . GERD (gastroesophageal reflux disease)   . Hemorrhoids   . Hiatal hernia   . Hyperlipidemia   . Hypertension   . Irritable bowel syndrome   . Osteoarthritis   . Personal history of diabetes mellitus    controlled with diet only  . Renal cell cancer ([place]) 11/2010 dx   R, s/p cryoablation 02/16/11  . Syncopal episodes    since childhood    Past Surgical History:  Procedure Laterality Date  . BREAST SURGERY     bilateral, cyst removal  . fallopian tube removed    . FUNCTIONAL ENDOSCOPIC SINUS SURGERY    . LASER ABLATION OF THE CERVIX    . 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 TOTAL KNEE ARTHROPLASTY Right 10/30/2016   Procedure: RIGHT TOTAL KNEE ARTHROPLASTY;  Surgeon: Meredith Pel, MD;  Location: [place];  Service: Orthopedics;  Laterality: Right;  . TUBAL LIGATION      There were no vitals filed for this visit.      Subjective Assessment - 03/28/17 1233    Subjective The sensitivity has improved a little.  i use the cane because my knee (demonstrates buckle).     Currently in Pain? Yes   Pain Score --  mild, no number given   Pain Location Knee   Pain Orientation Right;Left   Pain Descriptors / Indicators Aching   Pain Type Surgical pain   Pain Frequency Intermittent   Aggravating Factors  touching incision   Pain Relieving Factors changing position,  ice   Effect of Pain on Daily Activities needs cane in community/  some at home                         [facility] Adult PT Treatment/Exercise - 03/28/17 0001      Ambulation/Gait   Gait Comments YOGA Walking,  improved trunk rotation/  balance     Self-Care   Self-Care --  desensitization review     Knee/Hip Exercises: Public affairs consultant 3 reps  extended creeping holds   Sports administrator Limitations off edge of mat with moist heat   Hip Flexor Stretch 3 reps  with moist hest,  creeping long stretches. lengthened   Hip Flexor Stretch Limitations extended hold     Knee/Hip Exercises:  Aerobic   Recumbent Bike L1 6 minutes     Knee/Hip Exercises: Seated   Heel Slides 10 reps     Moist Heat Therapy   Number Minutes Moist Heat --  during supine   Moist Heat Location --  knee,  groin     Manual Therapy   Manual Therapy Other (comment)   Other Manual Therapy retrograde, patellar mobs creeping medial lateral,  P/A tibia glides,  mobs with rolled towel ,  PRom  hip and knee                PT Education - 03/28/17 1242    Education provided Yes   Education Details gait,  desentization   Person(s) Educated Patient   Methods Explanation   Comprehension Verbalized understanding          PT Short Term Goals - 03/25/17 1428      PT SHORT TERM GOAL #1   Title She will be independent with inital HEP   Time 4   Period Weeks   Status Achieved     PT SHORT TERM GOAL #2   Title She will improve active flexion to 115 degrees   Time 4   Period Weeks    Status Achieved     PT SHORT TERM GOAL #3   Title she will be walking in home without device   Time 4   Status Achieved     PT SHORT TERM GOAL #4   Title She will report decr pain 50% with activity on feet.    Time 4   Period Weeks           PT Long Term Goals - 03/25/17 1429      PT LONG TERM GOAL #1   Title She will be independent with all HEP issued   Time 12   Period Weeks   Status On-going     PT LONG TERM GOAL #2   Title She will report pain as intermitant    Time 12   Period Weeks   Status Achieved     PT LONG TERM GOAL #3   Title She will return to normal home tasks with 1-2 max pain   Time 12   Period Weeks   Status Partially Met     PT LONG TERM GOAL #4   Title She will walk without device in and out of home   Time 12   Period Weeks   Status On-going     PT LONG TERM GOAL #5   Title She will improve hip abduction strength to 4/5 or better to imptrove stability on feet.    Baseline 4/5 today   Time 12   Period Weeks   Status Achieved     PT LONG TERM GOAL #6   Title FOTO score improved to < 40% limited   Time 12   Period Weeks   Status Partially Met               Plan - 03/28/17 1243    Clinical Impression Statement Extra time spent stretching today  able to increase hip flexor lenget.  PROM Flexion 126.  No pain increased at end of session   PT Treatment/Interventions Cryotherapy;Gait training;Stair training;Passive range of motion;Patient/family education;Therapeutic exercise;Balance training;Therapeutic activities;Manual techniques;Taping   PT Next Visit Plan strength of hips and knees , gait,  flexion stretching if needed, balance,  gait without  SPC , balance training. check to see if calf is tight.  PT Home Exercise Plan HHPT HEP including quad set, LAQ, heel slides, hamstring curls, heel raises, sit-stand, added step up and calf stretch , side and forward touching on non stance foot     Consulted and Agree with Plan of Care  Patient      Patient will benefit from skilled therapeutic intervention in order to improve the following deficits and impairments:     Visit Diagnosis: History of total knee arthroplasty, right  Stiffness of right knee, not elsewhere classified  Localized edema  Right knee pain, unspecified chronicity  Difficulty in walking, not elsewhere classified     Problem List Patient Active Problem List   Diagnosis Date Noted  . Ulcer of right lower extremity ([place]) 11/29/2016  . Arthritis of knee 10/30/2016  . S/P knee surgery 10/30/2016  . Burning pain 10/15/2016  . Low back pain 08/01/2016  . Chronic low back pain 07/18/2016  . Prediabetes 05/30/2016  . Cervicalgia 05/30/2016  . Right knee pain 05/30/2016  . Osteopenia 11/18/2015  . Cephalalgia 07/20/2015  . Thrombocytopenia ([place]) 05/30/2015  . Arthritis of left lower extremity 02/02/2015  . Left knee pain 09/08/2014  . Gout 12/11/2013  . Vision disturbance 12/08/2013  . Severe obesity (BMI >= 40) ([place]) 11/06/2013  . Osteoporosis   . Left lumbar radiculopathy 04/13/2012  . Renal cell cancer ([place])   . External hemorrhoid 11/14/2010  . IBS (irritable bowel syndrome) 11/14/2010  . GERD (gastroesophageal reflux disease) 11/14/2010  . HYPERCHOLESTEROLEMIA, MILD 03/16/2010  . Lymphedema 03/15/2010  . ALLERGIC RHINITIS 08/31/2009  . MIGRAINE HEADACHE 06/14/2009  . SINUSITIS, CHRONIC 06/14/2009  . SYNCOPE 05/16/2009  . Essential hypertension 05/06/2009  . CONSTIPATION, CHRONIC 05/06/2009    , PTA 03/28/2017, 12:46 PM  [facility] [address] [place], [place], [address] Phone: [phone]   Fax:  [phone]  Name: Janet Le MRN: [id number] Date of Birth: [birth year]

## 2017-04-01 ENCOUNTER — Encounter: Payer: BLUE CROSS/BLUE SHIELD | Admitting: Physical Therapy

## 2017-04-01 ENCOUNTER — Ambulatory Visit: Payer: BLUE CROSS/BLUE SHIELD | Admitting: Internal Medicine

## 2017-04-03 ENCOUNTER — Other Ambulatory Visit: Payer: Self-pay | Admitting: Internal Medicine

## 2017-04-03 DIAGNOSIS — Z1231 Encounter for screening mammogram for malignant neoplasm of breast: Secondary | ICD-10-CM

## 2017-04-04 ENCOUNTER — Ambulatory Visit: Payer: BLUE CROSS/BLUE SHIELD | Admitting: Physical Therapy

## 2017-04-04 ENCOUNTER — Encounter: Payer: Self-pay | Admitting: Physical Therapy

## 2017-04-04 DIAGNOSIS — R262 Difficulty in walking, not elsewhere classified: Secondary | ICD-10-CM

## 2017-04-04 DIAGNOSIS — R600 Localized edema: Secondary | ICD-10-CM | POA: Diagnosis not present

## 2017-04-04 DIAGNOSIS — Z96651 Presence of right artificial knee joint: Secondary | ICD-10-CM | POA: Diagnosis not present

## 2017-04-04 DIAGNOSIS — M25561 Pain in right knee: Secondary | ICD-10-CM | POA: Diagnosis not present

## 2017-04-04 DIAGNOSIS — M25661 Stiffness of right knee, not elsewhere classified: Secondary | ICD-10-CM

## 2017-04-04 NOTE — Therapy (Signed)
[place], [place], [address] Phone: [phone]   Fax:  [phone]  Physical Therapy Treatment / Discharge summary  Patient Details  Name: Janet Le MRN: [id number] Date of Birth: 01/09/1956 Referring Provider: Bonnell Public, MD  Encounter Date: 04/04/2017      PT End of Session - 04/04/17 1103    Visit Number 17   Number of Visits 24   Date for PT Re-Evaluation 05/06/17   PT Start Time 1102   PT Stop Time 1142   PT Time Calculation (min) 40 min   Activity Tolerance Patient tolerated treatment well   Behavior During Therapy [facility] for tasks assessed/performed      Past Medical History:  Diagnosis Date  . Allergic rhinitis, cause unspecified   . Anxiety   . Blood dyscrasia    platelets low in past  . Chronic back pain   . Chronic constipation   . Chronic sinusitis   . Depression   . Endometriosis   . ENDOMETRIOSIS 05/06/2009   Qualifier: Diagnosis of  By: Janet Le    . Esophageal stricture   . GERD (gastroesophageal reflux disease)   . Hemorrhoids   . Hiatal hernia   . Hyperlipidemia   . Hypertension   . Irritable bowel syndrome   . Osteoarthritis   . Personal history of diabetes mellitus    controlled with diet only  . Renal cell cancer ([place]) 11/2010 dx   R, s/p cryoablation 02/16/11  . Syncopal episodes    since childhood    Past Surgical History:  Procedure Laterality Date  . BREAST SURGERY     bilateral, cyst removal  . fallopian tube removed    . FUNCTIONAL ENDOSCOPIC SINUS SURGERY    . LASER ABLATION OF THE CERVIX    . 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 TOTAL KNEE ARTHROPLASTY Right 10/30/2016   Procedure: RIGHT TOTAL KNEE ARTHROPLASTY;  Surgeon: Janet Pel, MD;  Location: [place];  Service: Orthopedics;  Laterality: Right;  . TUBAL LIGATION      There were no vitals filed for this visit.      Subjective Assessment -  04/04/17 1058    Subjective "I think my knee is about the same, Im not in pain today I did take medication for it"   Currently in Pain? No/denies   Pain Score 0-No pain   Pain Orientation Left;Right   Pain Descriptors / Indicators Aching   Pain Type Chronic pain   Pain Onset More than a month ago            [facility] PT Assessment - 04/04/17 1108      Observation/Other Assessments   Focus on Therapeutic Outcomes (FOTO)  49% limited     AROM   Right Knee Extension 0   Right Knee Flexion 122     Strength   Right Knee Flexion 4+/5   Right Knee Extension 4+/5                     [facility] Adult PT Treatment/Exercise - 04/04/17 1113      Knee/Hip Exercises: Stretches   Active Hamstring Stretch 2 reps;30 seconds;Right     Knee/Hip Exercises: Aerobic   Recumbent Bike L1 x 6 min  lowering seat eveyr 2 min     Knee/Hip Exercises: Standing   Hip Abduction Right;Left;2 sets;10 reps  with red theraband  Hip Extension Stengthening;Both;2 sets;Knee straight;10 reps  with red theraband     Knee/Hip Exercises: Seated   Sit to Sand 1 set;10 reps;without UE support                PT Education - 04/04/17 1115    Education provided Yes   Education Details reviewed previously provided HEP and benefits of continued exercise and strengthening. benefits of gym rountine.    Person(s) Educated Patient   Methods Explanation;Verbal cues   Comprehension Verbalized understanding          PT Short Term Goals - 04/04/17 1151      PT SHORT TERM GOAL #1   Title She will be independent with inital HEP   Time 4   Period Weeks   Status Achieved     PT SHORT TERM GOAL #2   Time 4   Period Weeks   Status Achieved     PT SHORT TERM GOAL #3   Title she will be walking in home without device   Time 4   Period Weeks   Status Achieved     PT SHORT TERM GOAL #4   Title She will report decr pain 50% with activity on feet.    Time 4   Period Weeks   Status Achieved            PT Long Term Goals - 04/04/17 1154      PT LONG TERM GOAL #1   Title She will be independent with all HEP issued   Time 12   Status Achieved     PT LONG TERM GOAL #2   Title She will report pain as intermitant    Time 12   Period Weeks   Status Achieved     PT LONG TERM GOAL #3   Title She will return to normal home tasks with 1-2 max pain   Time 12   Period Weeks   Status Achieved     PT LONG TERM GOAL #4   Title She will walk without device in and out of home   Baseline pt able to amb without SPC and reports she using SPC    Time 12   Period Weeks   Status Partially Met     PT LONG TERM GOAL #5   Title She will improve hip abduction strength to 4/5 or better to imptrove stability on feet.    Time 12   Period Weeks   Status Achieved     PT LONG TERM GOAL #6   Title FOTO score improved to < 40% limited   Baseline 54% limited  01/08/17   Time 12   Period Weeks   Status Achieved               Plan - 04/04/17 1157    Clinical Impression Statement [name] has made great progress with physical therapy with increased knee ROM form 0 - 122 degrees and improve strength. Additionally she reports no pain. reviewed perviously provided HEP and upgraded HEP. She met or partially met all goals and reports she is able to maintain and progress her current level of function.    PT Treatment/Interventions Cryotherapy;Gait training;Stair training;Passive range of motion;Patient/family education;Therapeutic exercise;Balance training;Therapeutic activities;Manual techniques;Taping   PT Next Visit Plan D/C   PT Home Exercise Plan HHPT HEP including quad set, LAQ, heel slides, hamstring curls, heel raises, sit-stand, added step up and calf stretch , side and forward touching on non stance foot  Consulted and Agree with Plan of Care Patient      Patient will benefit from skilled therapeutic intervention in order to improve the following deficits and impairments:   Pain, Decreased activity tolerance, Decreased strength, Increased edema, Decreased range of motion, Difficulty walking, Increased muscle spasms  Visit Diagnosis: History of total knee arthroplasty, right  Stiffness of right knee, not elsewhere classified  Right knee pain, unspecified chronicity  Difficulty in walking, not elsewhere classified  Localized edema     Problem List Patient Active Problem List   Diagnosis Date Noted  . Ulcer of right lower extremity ([place]) 11/29/2016  . Arthritis of knee 10/30/2016  . S/P knee surgery 10/30/2016  . Burning pain 10/15/2016  . Low back pain 08/01/2016  . Chronic low back pain 07/18/2016  . Prediabetes 05/30/2016  . Cervicalgia 05/30/2016  . Right knee pain 05/30/2016  . Osteopenia 11/18/2015  . Cephalalgia 07/20/2015  . Thrombocytopenia ([place]) 05/30/2015  . Arthritis of left lower extremity 02/02/2015  . Left knee pain 09/08/2014  . Gout 12/11/2013  . Vision disturbance 12/08/2013  . Severe obesity (BMI >= 40) ([place]) 11/06/2013  . Osteoporosis   . Left lumbar radiculopathy 04/13/2012  . Renal cell cancer ([place])   . External hemorrhoid 11/14/2010  . IBS (irritable bowel syndrome) 11/14/2010  . GERD (gastroesophageal reflux disease) 11/14/2010  . HYPERCHOLESTEROLEMIA, MILD 03/16/2010  . Lymphedema 03/15/2010  . ALLERGIC RHINITIS 08/31/2009  . MIGRAINE HEADACHE 06/14/2009  . SINUSITIS, CHRONIC 06/14/2009  . SYNCOPE 05/16/2009  . Essential hypertension 05/06/2009  . CONSTIPATION, CHRONIC 05/06/2009    Starr Lake PT, DPT, LAT, ATC  04/04/17  12:00 PM      [facility] [address] [place], [place], [address] Phone: [phone]   Fax:  [phone]  Name: Janet Le MRN: [id number] Date of Birth: [birth year]     PHYSICAL THERAPY DISCHARGE SUMMARY  Visits from Start of Care: 17  Current functional level related to goals / functional outcomes: See goals     Remaining deficits: See above assessment   Education / Equipment: HEP, theraband, posture Plan: Patient agrees to discharge.  Patient goals were partially met. Patient is being discharged due to meeting the stated rehab goals.  ?????    Suren Payne PT, DPT, LAT, ATC  04/04/17  12:02 PM

## 2017-04-04 NOTE — Progress Notes (Signed)
Subjective:    Patient ID: Janet Le, female    DOB: 02-29-1956, 61 y.o.   MRN: [id number]  HPI The patient is here for follow up.  Hypertension: She is taking her medication daily. She is not always compliant with a low sodium diet.  She denies chest pain, palpitations, edema, shortness of breath and regular headaches. She is not exercising regularly.  She does not monitor her blood pressure at home.    Prediabetes:  She is compliant with a low sugar/carbohydrate diet.  She is not exercising regularly.  Hyperlipidemia: She is taking her medication daily. She is compliant with a low fat/cholesterol diet. She is not exercising regularly.    Obesity:  She is taking saxenda.  Her weight has been stable. She has not been eating as well recently.  She is not able to exercise.  Right leg wound:  The wound has been there since May.  She did see someone at the  wound  Clinic and they debrided the wound and advised a cream to use.  It has continued to improve but is getting better slowly  She continues to have joint pain and back pain.  She is filling for SS disability.    Medications and allergies reviewed with patient and updated if appropriate.  Patient Active Problem List   Diagnosis Date Noted  . Ulcer of right lower extremity ([place]) 11/29/2016  . Arthritis of knee 10/30/2016  . S/P knee surgery 10/30/2016  . Burning pain 10/15/2016  . Low back pain 08/01/2016  . Chronic low back pain 07/18/2016  . Prediabetes 05/30/2016  . Cervicalgia 05/30/2016  . Right knee pain 05/30/2016  . Osteopenia 11/18/2015  . Cephalalgia 07/20/2015  . Thrombocytopenia ([place]) 05/30/2015  . Arthritis of left lower extremity 02/02/2015  . Left knee pain 09/08/2014  . Gout 12/11/2013  . Vision disturbance 12/08/2013  . Severe obesity (BMI >= 40) ([place]) 11/06/2013  . Osteoporosis   . Left lumbar radiculopathy 04/13/2012  . Renal cell cancer ([place])   . External hemorrhoid 11/14/2010  . IBS  (irritable bowel syndrome) 11/14/2010  . GERD (gastroesophageal reflux disease) 11/14/2010  . HYPERCHOLESTEROLEMIA, MILD 03/16/2010  . Lymphedema 03/15/2010  . ALLERGIC RHINITIS 08/31/2009  . MIGRAINE HEADACHE 06/14/2009  . SINUSITIS, CHRONIC 06/14/2009  . SYNCOPE 05/16/2009  . Essential hypertension 05/06/2009  . CONSTIPATION, CHRONIC 05/06/2009    Current Outpatient Prescriptions on File Prior to Visit  Medication Sig Dispense Refill  . ACCU-CHEK SOFTCLIX LANCETS lancets by Other route. Use twice a day for blood sugar     . alendronate (FOSAMAX) 70 MG tablet Take 1 tablet (70 mg total) by mouth once a week. Take with a full glass of water on an empty stomach. 4 tablet 0  . amLODipine (NORVASC) 5 MG tablet Take 1 tablet (5 mg total) by mouth daily. 90 tablet 3  . atorvastatin (LIPITOR) 10 MG tablet Take 1 tablet (10 mg total) by mouth daily. Annual appt due in Dec must see provider for future refills 30 tablet 2  . calcium-vitamin D (OSCAL WITH D) 500-200 MG-UNIT tablet Take 1 tablet by mouth daily.     . Diclofenac Sodium 2 % SOLN Apply 1 pump twice daily. (Patient taking differently: Apply 1 application topically 2 (two) times daily as needed (PAIN). Apply 1 pump twice daily.) 112 g 1  . fluticasone (FLONASE) 50 MCG/ACT nasal spray Place 1 spray into both nostrils daily. 16 g 2  . furosemide (LASIX) 20 MG tablet  TAKE 1 TABLET BY MOUTH ONCE DAILY 30 tablet 2  . glucose blood (ONETOUCH VERIO) test strip Use one strip to check blood sugar daily 100 each 4  . HYDROmorphone (DILAUDID) 2 MG tablet Take 1 tablet (2 mg total) by mouth every 4 (four) hours as needed for severe pain. 45 tablet 0  . Insulin Pen Needle (NOVOFINE) 32G X 6 MM MISC Use daily with Saxenda 100 each 1  . Linaclotide (LINZESS) 145 MCG CAPS capsule Take 1 capsule (145 mcg total) by mouth daily. (Patient taking differently: Take 145 mcg by mouth daily as needed (CONSTIPATION). ) 90 capsule 1  . Liraglutide -Weight Management  (SAXENDA) 18 MG/3ML SOPN Inject 3 mg into the skin daily. 15 pen 3  . losartan (COZAAR) 100 MG tablet Take 1 tablet (100 mg total) by mouth daily. 30 tablet 2  . methocarbamol (ROBAXIN) 500 MG tablet Take 1 tablet (500 mg total) by mouth every 6 (six) hours as needed for muscle spasms. 30 tablet 2  . NONFORMULARY OR COMPOUNDED ITEM [facility]  Onychomycosis Nail Lacquer -  Fluconazole 2%, Terbinafine 1% DMSO Apply to affected nail once daily Qty. 120 gm 3 refills 1 each 2  . nystatin (MYCOSTATIN) 100000 UNIT/ML suspension Take 5 mLs by mouth 3 (three) times daily. SWISH AND SWALLOW    . ondansetron (ZOFRAN) 4 MG tablet Take 1-2 tablets (4-8 mg total) by mouth every 8 (eight) hours as needed for nausea or vomiting. 40 tablet 0  . ONETOUCH DELICA LANCETS 19F MISC 1 each by Does not apply route daily. Use to help check blood sugars once daily Dx 250.00 100 each 3  . pregabalin (LYRICA) 50 MG capsule Take 1 capsule (50 mg total) by mouth 3 (three) times daily. 504 capsule 0  . Prenatal Vit-Fe Fumarate-FA (PNV PRENATAL PLUS MULTIVITAMIN) 27-1 MG TABS TAKE 1 TABLET DAILY AT 12 NOON 60 tablet 3  . propranolol (INDERAL) 80 MG tablet Take 1 tablet (80 mg total) by mouth 2 (two) times daily. 60 tablet 0  . terbinafine (LAMISIL) 250 MG tablet     . zolpidem (AMBIEN) 5 MG tablet Take 1 tablet (5 mg total) by mouth at bedtime as needed for sleep. 30 tablet 0   No current facility-administered medications on file prior to visit.     Past Medical History:  Diagnosis Date  . Allergic rhinitis, cause unspecified   . Anxiety   . Blood dyscrasia    platelets low in past  . Chronic back pain   . Chronic constipation   . Chronic sinusitis   . Depression   . Endometriosis   . ENDOMETRIOSIS 05/06/2009   Qualifier: Diagnosis of  By: Varney Daily RN, Butch Penny    . Esophageal stricture   . GERD (gastroesophageal reflux disease)   . Hemorrhoids   . Hiatal hernia   . Hyperlipidemia   . Hypertension   .  Irritable bowel syndrome   . Osteoarthritis   . Personal history of diabetes mellitus    controlled with diet only  . Renal cell cancer ([place]) 11/2010 dx   R, s/p cryoablation 02/16/11  . Syncopal episodes    since childhood    Past Surgical History:  Procedure Laterality Date  . BREAST SURGERY     bilateral, cyst removal  . fallopian tube removed    . FUNCTIONAL ENDOSCOPIC SINUS SURGERY    . LASER ABLATION OF THE CERVIX    . 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 TOTAL KNEE ARTHROPLASTY Right 10/30/2016   Procedure: RIGHT TOTAL KNEE ARTHROPLASTY;  Surgeon: Meredith Pel, MD;  Location: [facility];  Service: Orthopedics;  Laterality: Right;  . TUBAL LIGATION      Social History   Social History  . Marital status: Married    Spouse name: N/A  . Number of children: N/A  . Years of education: N/A   Occupational History  . 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 Smoking status: Never Smoker  . Smokeless tobacco: Never Used  . Alcohol use No     Comment: rare  . Drug use: No  . Sexual activity: No   Other Topics Concern  . None   Social History Narrative  . None    Family History  Problem Relation Age of Onset  . Diabetes Mother   . Kidney disease Father   . Prostate cancer Father   . Colitis Brother   . Leukemia Brother   . Multiple sclerosis Sister   . Colon polyps Unknown   . Colon cancer Neg Hx     Review of Systems  Constitutional: Negative for chills and fever.  Respiratory: Negative for cough, shortness of breath and wheezing.        Takes occasional deep breath  Cardiovascular: Positive for leg swelling (wearing compression socks). Negative for chest pain and palpitations.  Musculoskeletal: Positive for arthralgias.  Neurological: Negative for light-headedness and headaches.       Objective:   Vitals:   04/05/17 1316  BP: 134/82  Pulse: 68  Temp: 98.1 F  (36.7 C)  SpO2: 100%   Wt Readings from Last 3 Encounters:  04/05/17 216 lb (98 kg)  02/28/17 228 lb (103.4 kg)  11/29/16 214 lb (97.1 kg)   Body mass index is 40.15 kg/m.   Physical Exam    Constitutional: Appears well-developed and well-nourished. No distress.  HENT:  Head: Normocephalic and atraumatic.  Neck: Neck supple. No tracheal deviation present. No thyromegaly present.  No cervical lymphadenopathy Cardiovascular: Normal rate, regular rhythm and normal heart sounds.   No murmur heard. No carotid bruit .  No edema Pulmonary/Chest: Effort normal and breath sounds normal. No respiratory distress. No has no wheezes. No rales.  Skin: Skin is warm and dry. Not diaphoretic.  healing ulcer - stage one on posterior right proximal lower leg - indurated, minimal tenderness, no fluctuance, no open wound Psychiatric: Normal mood and affect. Behavior is normal.      Assessment & Plan:    See Problem List for Assessment and Plan of chronic medical problems.

## 2017-04-04 NOTE — Patient Instructions (Addendum)

## 2017-04-05 ENCOUNTER — Ambulatory Visit (INDEPENDENT_AMBULATORY_CARE_PROVIDER_SITE_OTHER): Payer: BLUE CROSS/BLUE SHIELD | Admitting: Internal Medicine

## 2017-04-05 ENCOUNTER — Other Ambulatory Visit (INDEPENDENT_AMBULATORY_CARE_PROVIDER_SITE_OTHER): Payer: BLUE CROSS/BLUE SHIELD

## 2017-04-05 ENCOUNTER — Encounter: Payer: Self-pay | Admitting: Internal Medicine

## 2017-04-05 VITALS — BP 134/82 | HR 68 | Temp 98.1°F | Ht 61.5 in | Wt 216.0 lb

## 2017-04-05 DIAGNOSIS — E7800 Pure hypercholesterolemia, unspecified: Secondary | ICD-10-CM | POA: Diagnosis not present

## 2017-04-05 DIAGNOSIS — R7303 Prediabetes: Secondary | ICD-10-CM

## 2017-04-05 DIAGNOSIS — I10 Essential (primary) hypertension: Secondary | ICD-10-CM

## 2017-04-05 DIAGNOSIS — L97919 Non-pressure chronic ulcer of unspecified part of right lower leg with unspecified severity: Secondary | ICD-10-CM | POA: Diagnosis not present

## 2017-04-05 DIAGNOSIS — Z23 Encounter for immunization: Secondary | ICD-10-CM | POA: Diagnosis not present

## 2017-04-05 LAB — COMPREHENSIVE METABOLIC PANEL
ALT: 19 U/L (ref 0–35)
AST: 20 U/L (ref 0–37)
Albumin: 3.8 g/dL (ref 3.5–5.2)
Alkaline Phosphatase: 83 U/L (ref 39–117)
BUN: 11 mg/dL (ref 6–23)
CO2: 33 mEq/L — ABNORMAL HIGH (ref 19–32)
Calcium: 9.4 mg/dL (ref 8.4–10.5)
Chloride: 106 mEq/L (ref 96–112)
Creatinine, Ser: 0.88 mg/dL (ref 0.40–1.20)
GFR: 83.82 mL/min (ref >60.00)
Glucose, Bld: 81 mg/dL (ref 70–99)
Potassium: 4.1 mEq/L (ref 3.5–5.1)
Sodium: 142 mEq/L (ref 135–145)
Total Bilirubin: 0.4 mg/dL (ref 0.2–1.2)
Total Protein: 7.1 g/dL (ref 6.0–8.3)

## 2017-04-05 LAB — HEMOGLOBIN A1C: Hgb A1c MFr Bld: 5.6 % (ref 4.6–6.5)

## 2017-04-05 NOTE — Assessment & Plan Note (Addendum)
Weight stable No side effects from saxenda - will continue - stressed that this is not a weight maintenance drug - it is a weight loss drug - needs to improve diet and work on weight loss

## 2017-04-05 NOTE — Assessment & Plan Note (Signed)
Check a1c Low sugar / carb diet Stressed regular exercise, weight loss  

## 2017-04-05 NOTE — Assessment & Plan Note (Signed)
Continue statin. 

## 2017-04-05 NOTE — Assessment & Plan Note (Signed)
Did see wound center Healing slowly

## 2017-04-05 NOTE — Assessment & Plan Note (Signed)
BP well controlled Current regimen effective and well tolerated Continue current medications at current doses cmp  

## 2017-04-06 ENCOUNTER — Encounter: Payer: Self-pay | Admitting: Internal Medicine

## 2017-04-08 ENCOUNTER — Encounter: Payer: BLUE CROSS/BLUE SHIELD | Admitting: Physical Therapy

## 2017-04-11 ENCOUNTER — Encounter: Payer: BLUE CROSS/BLUE SHIELD | Admitting: Physical Therapy

## 2017-04-12 ENCOUNTER — Other Ambulatory Visit: Payer: Self-pay | Admitting: Internal Medicine

## 2017-04-15 ENCOUNTER — Ambulatory Visit: Payer: BLUE CROSS/BLUE SHIELD | Admitting: Physical Therapy

## 2017-04-16 ENCOUNTER — Telehealth: Payer: Self-pay | Admitting: Internal Medicine

## 2017-04-16 ENCOUNTER — Telehealth: Payer: Self-pay | Admitting: Neurology

## 2017-04-16 MED ORDER — ATORVASTATIN CALCIUM 10 MG PO TABS: 10.0000 mg | ORAL_TABLET | Freq: Every day | ORAL | 1 refills | Status: DC

## 2017-04-16 MED ORDER — ATORVASTATIN CALCIUM 10 MG PO TABS: 10.0000 mg | ORAL_TABLET | Freq: Every day | ORAL | 0 refills | Status: DC

## 2017-04-16 NOTE — Telephone Encounter (Signed)
Sent to both pharmacy.Marland KitchenJohny Le

## 2017-04-16 NOTE — Telephone Encounter (Signed)
I don't know [facility] not be [facility] the medication - is this an insurance issue  I have not heard of revimex..  She is not a candidate for the other two medications. She can come in and discuss.

## 2017-04-16 NOTE — Telephone Encounter (Signed)
Called Pt, no answer, LM on VM advising her the Lyrica was to be taken 75mg  twice a day for pain control.

## 2017-04-16 NOTE — Telephone Encounter (Addendum)
Pt called stating that [facility] no longer carries Saxenda and they recommened that she try: 1. Phentermine 2. Contrave (pt said that she would like to try this) 3. Revimex She would need a prior authorization for either of these medications. She asked if a 3 month supply could be sent to [facility] and a 14 day supply sent to [place] on [address] to get her through until she received the refill from [facility].

## 2017-04-16 NOTE — Telephone Encounter (Signed)
Patient called and would like to speak with you regarding her medication Lyrica. She's not sure how many she should be taking. Please Call. Thanks

## 2017-04-16 NOTE — Telephone Encounter (Signed)
Pt called requesting a refill on Atorvastatin: She asked if a 3 month supply could be sent to [facility] and a 14 day supply sent to [place] on [address] to get her through until she received the refill from [facility].

## 2017-04-17 ENCOUNTER — Ambulatory Visit (INDEPENDENT_AMBULATORY_CARE_PROVIDER_SITE_OTHER): Payer: BLUE CROSS/BLUE SHIELD | Admitting: Orthopedic Surgery

## 2017-04-17 ENCOUNTER — Encounter (INDEPENDENT_AMBULATORY_CARE_PROVIDER_SITE_OTHER): Payer: Self-pay | Admitting: Orthopedic Surgery

## 2017-04-17 ENCOUNTER — Other Ambulatory Visit: Payer: Self-pay | Admitting: Emergency Medicine

## 2017-04-17 DIAGNOSIS — Z96651 Presence of right artificial knee joint: Secondary | ICD-10-CM

## 2017-04-17 MED ORDER — LOSARTAN POTASSIUM 100 MG PO TABS: 100.0000 mg | ORAL_TABLET | Freq: Every day | ORAL | 1 refills | Status: DC

## 2017-04-17 NOTE — Telephone Encounter (Signed)
Pt notified of MD response, will call back to make an appointment.

## 2017-04-17 NOTE — Telephone Encounter (Signed)
Noted  

## 2017-04-17 NOTE — Progress Notes (Signed)
Office Visit Note   Patient: Janet Le           Date of Birth: March 08, 1956           MRN: [id number] Visit Date: 04/17/2017 Requested by: Binnie Rail, MD [place], [place] [id number] PCP: Binnie Rail, MD  Subjective: Chief Complaint  Patient presents with  . Right Knee - Follow-up    HPI: Janet Le is now 5 months out right total knee replacement.  She's doing better making progress.  She is out of physical therapy.  She has applied for disability.  She is doing a home exercise program.  She uses a cane.  Takes a very occasional pain medicine.  She had an ulcer on the posterior aspect of the right calf which has healed.              ROS: All systems reviewed are negative as they relate to the chief complaint within the history of present illness.  Patient denies  fevers or chills.   Assessment & Plan: Visit Diagnoses:  1. Status post right knee replacement     Plan: Impression is well functioning right total knee replacement.  Plan is continue quad strengthening.  Need for dental antibiotic prophylaxis discussed.  All questions answered.  I will see her back as needed  Follow-Up Instructions: Return if symptoms worsen or fail to improve.   Orders:  No orders of the defined types were placed in this encounter.  No orders of the defined types were placed in this encounter.     Procedures: No procedures performed   Clinical Data: No additional findings.  Objective: Vital Signs: There were no vitals taken for this visit.  Physical Exam:   Constitutional: Patient appears well-developed HEENT:  Head: Normocephalic Eyes:EOM are normal Neck: Normal range of motion Cardiovascular: Normal rate Pulmonary/chest: Effort normal Neurologic: Patient is alert Skin: Skin is warm Psychiatric: Patient has normal mood and affect    Ortho Exam: Orthopedic exam demonstrates increased body mass index but pretty normal gait and alignment excellent range of  motion of the right knee with no effusion 120 of flexion and about less than 10 of hyperextension on the right knee compared to 30 of hyperextension on the left collaterals are stable.  Specialty Comments:  No specialty comments available.  Imaging: No results found.   PMFS History: Patient Active Problem List   Diagnosis Date Noted  . Ulcer of right lower extremity ([place]) 11/29/2016  . Arthritis of knee 10/30/2016  . S/P knee surgery 10/30/2016  . Burning pain 10/15/2016  . Low back pain 08/01/2016  . Chronic low back pain 07/18/2016  . Prediabetes 05/30/2016  . Cervicalgia 05/30/2016  . Right knee pain 05/30/2016  . Osteopenia 11/18/2015  . Thrombocytopenia ([place]) 05/30/2015  . Arthritis of left lower extremity 02/02/2015  . Left knee pain 09/08/2014  . Gout 12/11/2013  . Vision disturbance 12/08/2013  . Severe obesity (BMI >= 40) ([place]) 11/06/2013  . Osteoporosis   . Left lumbar radiculopathy 04/13/2012  . Renal cell cancer ([place])   . External hemorrhoid 11/14/2010  . IBS (irritable bowel syndrome) 11/14/2010  . GERD (gastroesophageal reflux disease) 11/14/2010  . HYPERCHOLESTEROLEMIA, MILD 03/16/2010  . Lymphedema 03/15/2010  . ALLERGIC RHINITIS 08/31/2009  . MIGRAINE HEADACHE 06/14/2009  . SINUSITIS, CHRONIC 06/14/2009  . SYNCOPE 05/16/2009  . Essential hypertension 05/06/2009  . CONSTIPATION, CHRONIC 05/06/2009   Past Medical History:  Diagnosis Date  . Allergic rhinitis, cause  unspecified   . Anxiety   . Blood dyscrasia    platelets low in past  . Chronic back pain   . Chronic constipation   . Chronic sinusitis   . Depression   . Endometriosis   . ENDOMETRIOSIS 05/06/2009   Qualifier: Diagnosis of  By: Varney Daily RN, Butch Penny    . Esophageal stricture   . GERD (gastroesophageal reflux disease)   . Hemorrhoids   . Hiatal hernia   . Hyperlipidemia   . Hypertension   . Irritable bowel syndrome   . Osteoarthritis   . Personal history of diabetes mellitus     controlled with diet only  . Renal cell cancer ([place]) 11/2010 dx   R, s/p cryoablation 02/16/11  . Syncopal episodes    since childhood    Family History  Problem Relation Age of Onset  . Diabetes Mother   . Kidney disease Father   . Prostate cancer Father   . Colitis Brother   . Leukemia Brother   . Multiple sclerosis Sister   . Colon polyps Unknown   . Colon cancer Neg Hx     Past Surgical History:  Procedure Laterality Date  . BREAST SURGERY     bilateral, cyst removal  . fallopian tube removed    . FUNCTIONAL ENDOSCOPIC SINUS SURGERY    . LASER ABLATION OF THE CERVIX    . 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 TOTAL KNEE ARTHROPLASTY Right 10/30/2016   Procedure: RIGHT TOTAL KNEE ARTHROPLASTY;  Surgeon: Meredith Pel, MD;  Location: [facility];  Service: Orthopedics;  Laterality: Right;  . TUBAL LIGATION     Social History   Occupational History  . 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 Smoking status: Never Smoker  . Smokeless tobacco: Never Used  . Alcohol use No     Comment: rare  . Drug use: No  . Sexual activity: No

## 2017-04-18 ENCOUNTER — Telehealth: Payer: Self-pay | Admitting: Internal Medicine

## 2017-04-18 ENCOUNTER — Ambulatory Visit: Payer: BLUE CROSS/BLUE SHIELD | Admitting: Physical Therapy

## 2017-04-18 MED ORDER — AMLODIPINE BESYLATE 5 MG PO TABS: 5.0000 mg | ORAL_TABLET | Freq: Every day | ORAL | 3 refills | Status: DC

## 2017-04-18 MED ORDER — LOSARTAN POTASSIUM 100 MG PO TABS: 100.0000 mg | ORAL_TABLET | Freq: Every day | ORAL | 0 refills | Status: DC

## 2017-04-18 MED ORDER — LOSARTAN POTASSIUM 100 MG PO TABS: 100.0000 mg | ORAL_TABLET | Freq: Every day | ORAL | 1 refills | Status: DC

## 2017-04-18 MED ORDER — AMLODIPINE BESYLATE 5 MG PO TABS: 5.0000 mg | ORAL_TABLET | Freq: Every day | ORAL | 0 refills | Status: DC

## 2017-04-18 NOTE — Telephone Encounter (Signed)
Pt called stating that she would like to speak with you when you are available. She would not go into detail with me about what it was regarding.

## 2017-04-18 NOTE — Telephone Encounter (Signed)
Spoke with pt states she needs amlodipine and losartan sent to local [facility]. Theses were supposed to get sent in earlier this week and did not.   [facility] also [facility] and do no know when they will get this medication in. Pt would like to know if she can have an alternative med sent in.

## 2017-04-18 NOTE — Telephone Encounter (Signed)
  Amlodipine and losartan sent to local pharmacy.  Would recommend trying to get the saxenda filled at a local pharmacy.  There is no easy replacement for the saxenda the 2 other medications mentioned previously she does not qualify for.

## 2017-04-19 NOTE — Telephone Encounter (Signed)
LVM informing pt

## 2017-04-22 ENCOUNTER — Telehealth: Payer: Self-pay

## 2017-04-22 NOTE — Telephone Encounter (Signed)
Pt called the office, she was still confused about Lyrica dose. After reviewing chart and talking with Pt, I determined she has 50mg  not 75mg , however, the Pt has been taking # 2, 50mg  capsules Qam and  #2, 50mg  capsules Qpm. I advsd her It was actually supposed to be 1 TID. Pt states she is doing well on this dose and time and wishes to continue taking it this way.

## 2017-04-23 ENCOUNTER — Ambulatory Visit
Admission: RE | Admit: 2017-04-23 | Discharge: 2017-04-23 | Disposition: A | Discharge status: Home / Self Care | Payer: BLUE CROSS/BLUE SHIELD | Source: Ambulatory Visit | Attending: Internal Medicine | Admitting: Internal Medicine

## 2017-04-23 DIAGNOSIS — Z1231 Encounter for screening mammogram for malignant neoplasm of breast: Secondary | ICD-10-CM

## 2017-05-02 ENCOUNTER — Telehealth: Payer: Self-pay | Admitting: Internal Medicine

## 2017-05-02 NOTE — Telephone Encounter (Signed)
Left message advising patient that she can either be added to wait list here at our office, or we can send script over to [facility] and she can go there to get vaccine---can talk with Issiah Huffaker if any further questions

## 2017-05-02 NOTE — Telephone Encounter (Signed)
Patient would like to know if it is time for a routine DEXA and if so can this be order?

## 2017-05-02 NOTE — Telephone Encounter (Signed)
Patient is requesting Shingrix vac.  States insurance covers at 100%.  Can we get approval to schedule?

## 2017-05-02 NOTE — Telephone Encounter (Signed)
She is due 10/2017 - we will order then

## 2017-05-03 ENCOUNTER — Telehealth: Payer: Self-pay

## 2017-05-03 NOTE — Telephone Encounter (Signed)
Patient has been added to shingrix waitlist 

## 2017-05-03 NOTE — Telephone Encounter (Signed)
Pt informed of MD response and has been placed on the Shingrix wait list.

## 2017-05-03 NOTE — Telephone Encounter (Signed)
-----   Message from [facility], [place] sent at 05/03/2017  1:40 PM EST ----- Regarding: Shingrix Can you make sure this patient is on the list for Shingrix.

## 2017-05-05 NOTE — Patient Instructions (Addendum)
  No immunizations administered today.  A toradol injection was given.  Medications reviewed and updated.  Changes include trying Belviq for weight loss   Your prescription(s) have been submitted to your pharmacy. Please take as directed and contact our office if you believe you are having problem(s) with the medication(s).  A referral was ordered for vascular and nutrition.    An echo of your heart was ordered - someone will call you to schedule that.   Please followup in 1 month
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
## 2017-05-07 ENCOUNTER — Encounter: Payer: Self-pay | Admitting: Internal Medicine

## 2017-05-07 ENCOUNTER — Ambulatory Visit (INDEPENDENT_AMBULATORY_CARE_PROVIDER_SITE_OTHER): Payer: BLUE CROSS/BLUE SHIELD | Admitting: Internal Medicine

## 2017-05-07 VITALS — BP 134/94 | HR 59 | Temp 98.3°F | Resp 16 | Ht 61.5 in | Wt 248.0 lb

## 2017-05-07 DIAGNOSIS — R7303 Prediabetes: Secondary | ICD-10-CM | POA: Diagnosis not present

## 2017-05-07 DIAGNOSIS — M810 Age-related osteoporosis without current pathological fracture: Secondary | ICD-10-CM

## 2017-05-07 DIAGNOSIS — R51 Headache: Secondary | ICD-10-CM | POA: Diagnosis not present

## 2017-05-07 DIAGNOSIS — I10 Essential (primary) hypertension: Secondary | ICD-10-CM

## 2017-05-07 DIAGNOSIS — R519 Headache, unspecified: Secondary | ICD-10-CM

## 2017-05-07 DIAGNOSIS — E669 Obesity, unspecified: Secondary | ICD-10-CM

## 2017-05-07 DIAGNOSIS — E7800 Pure hypercholesterolemia, unspecified: Secondary | ICD-10-CM

## 2017-05-07 DIAGNOSIS — I890 Lymphedema, not elsewhere classified: Secondary | ICD-10-CM

## 2017-05-07 MED ORDER — KETOROLAC TROMETHAMINE 60 MG/2ML IM SOLN
60.0000 mg | Freq: Once | INTRAMUSCULAR | Status: AC
  Administered 2017-05-07: 60 mg via INTRAMUSCULAR

## 2017-05-07 MED ORDER — LORCASERIN HCL 10 MG PO TABS: 10.0000 mg | ORAL_TABLET | Freq: Two times a day (BID) | ORAL | 0 refills | Status: DC

## 2017-05-07 NOTE — Assessment & Plan Note (Signed)
Diagnosed with lymphedema in bilateral lower extremities 1997 Taking Lasix daily, which was controlling her edema previously, but she does not feel it is controlled at this time Has grade 1 diastolic dysfunction-I do not think this is contributing to her edema.  Last EF was normal in 2015.  Will repeat echo. Does want to see a specialist regarding further evaluation-we will refer to vascular for their input Continue Lasix 20 mg daily Stressed increasing exercise and working on weight loss

## 2017-05-07 NOTE — Assessment & Plan Note (Signed)
Intractable - possible tension headache Will give her a toradol injection today - 60 mg IM x 1

## 2017-05-07 NOTE — Assessment & Plan Note (Signed)
Blood pressure slightly elevated today, but has been better controlled We will continue current medications at current doses Had recent CMP-no blood work needed today Stressed low-sodium diet, increasing exercise and working on weight loss

## 2017-05-07 NOTE — Assessment & Plan Note (Signed)
Due for dexa this year or next - done via gyn Fosamax started 2014 - will continue until next dexa

## 2017-05-07 NOTE — Assessment & Plan Note (Signed)
Continue statin. 

## 2017-05-07 NOTE — Assessment & Plan Note (Signed)
Unable to afford saxenda Unable to take contrave - did not tolerate wellbutrin Will try belviq -if not covered - can try qsymia

## 2017-05-07 NOTE — Assessment & Plan Note (Signed)
A1c done recently and was in the normal range Stressed low sugar/carbohydrate diet Increase exercise, work on weight loss

## 2017-05-10 ENCOUNTER — Telehealth: Payer: Self-pay | Admitting: Internal Medicine

## 2017-05-10 ENCOUNTER — Other Ambulatory Visit: Payer: Self-pay | Admitting: Internal Medicine

## 2017-05-10 DIAGNOSIS — I890 Lymphedema, not elsewhere classified: Secondary | ICD-10-CM

## 2017-05-10 NOTE — Telephone Encounter (Signed)
Pt called and her Lorcaserin HCl 10 MG TABS  Needs a PA

## 2017-05-13 NOTE — Telephone Encounter (Addendum)
PA has been completed and approved through 08/11/17 Pharmacy notified via Fax

## 2017-05-14 ENCOUNTER — Encounter: Payer: Self-pay | Admitting: Neurology

## 2017-05-14 ENCOUNTER — Ambulatory Visit (INDEPENDENT_AMBULATORY_CARE_PROVIDER_SITE_OTHER): Payer: BLUE CROSS/BLUE SHIELD | Admitting: Neurology

## 2017-05-14 VITALS — BP 130/80 | HR 64 | Ht 61.0 in | Wt 247.0 lb

## 2017-05-14 DIAGNOSIS — G629 Polyneuropathy, unspecified: Secondary | ICD-10-CM

## 2017-05-14 DIAGNOSIS — R7303 Prediabetes: Secondary | ICD-10-CM | POA: Diagnosis not present

## 2017-05-14 DIAGNOSIS — G6289 Other specified polyneuropathies: Secondary | ICD-10-CM

## 2017-05-14 MED ORDER — PREGABALIN 50 MG PO CAPS: 50.0000 mg | ORAL_CAPSULE | Freq: Two times a day (BID) | ORAL | 0 refills | Status: DC

## 2017-05-14 MED ORDER — PREGABALIN 100 MG PO CAPS: 100.0000 mg | ORAL_CAPSULE | Freq: Two times a day (BID) | ORAL | 5 refills | Status: DC

## 2017-05-14 NOTE — Progress Notes (Signed)
NEUROLOGY FOLLOW UP OFFICE NOTE  Janet Le [id number]  HISTORY OF PRESENT ILLNESS: Janet Le is a 61 year old right-handed woman with history of renal cell carcinoma, hypertension, pre-diabetes, migraine, IBS, lymphedema, osteoarthritis and osteoperosis who follows up for neuropathy.  UPDATE: For neuropathy, she is taking Lyrica 100mg  twice daily.  Pain is better controlled.  She still feels dizzy at times.   HISTORY: NEUROPATHY:  In 2016, she started experiencing burning pain from the bottom of her feet up the back of her legs to the knees.  It is intermittent and lasts for various and unknown period of time, but not aware of any triggers such as due to position.  In addition, she is reporting increased back pain and spasms, worse when sitting and improved when laying supine.  She has a history of chronic low back pain with left lumbar radiculopathy, as well as right knee pain.  She has been seen and evaluated by orthopedics and pain management.  MRI of lumbar spine from 07/27/16 revealed advanced L4-5 and L5-S1 facet arthrosis with some bilateral neural foraminal stenosis.  Dr. Marlou Sa of orthopedics did not think surgery was an option at that time.  TSH from 10/15/16 was 1.67.  B12 from 10/18/16 was 537.  Hgb A1c from 04/05/17 was 5.6.  NCV-EMG from 10/25/16 demonstrated no evidence of large fiber seonsorimotor polyneuropathy or lumbosacral radiculopathy.   Other History: VISUAL DISTURBANCE:  Symptoms started in 2014.  She describes initially seeing a line of light along the bottom of her visual field in the left eye.  She then developed what looks like bubbles floating up in the temporal aspect of the visual fields of in both eyes.  It lasts only a few seconds and occurs several times a day.  There is no vision loss.  There is no associated headache.  There was a concern for diabetic retinopathy.  She saw Dr. Katy Fitch, an ophthalmologist on 11/02/13.  The exam did not reveal diabetic  retinopathy, but a cardiac evaluation was recommended.  Possibly ocular migraines.  DIZZINESS:  Also in 2014, she began feeling dizzy.  She describes it as a feeling of lightheadedness, as if she is going to pass out.  There is no spinning sensation.  She sometimes hears "static" during these spells.  She has been found to have low blood pressure and is orthostatic.  She also began feeling diffuse pain in her joints.  Uric acid level was 11.4.  HCTZ was discontinued today and she was placed on colchicine.    MRI of brain without contrast from 12/19/13 demonstrated incidental small 11 mm dural calcification but nothing concerning.  PAST MEDICAL HISTORY: Past Medical History:  Diagnosis Date  . Allergic rhinitis, cause unspecified   . Anxiety   . Blood dyscrasia    platelets low in past  . Chronic back pain   . Chronic constipation   . Chronic sinusitis   . Depression   . Endometriosis   . ENDOMETRIOSIS 05/06/2009   Qualifier: Diagnosis of  By: Varney Daily RN, Butch Penny    . Esophageal stricture   . GERD (gastroesophageal reflux disease)   . Hemorrhoids   . Hiatal hernia   . Hyperlipidemia   . Hypertension   . Irritable bowel syndrome   . Osteoarthritis   . Personal history of diabetes mellitus    controlled with diet only  . Renal cell cancer ([place]) 11/2010 dx   R, s/p cryoablation 02/16/11  . Syncopal episodes    since  childhood    MEDICATIONS: Current Outpatient Medications on File Prior to Visit  Medication Sig Dispense Refill  . ACCU-CHEK SOFTCLIX LANCETS lancets by Other route. Use twice a day for blood sugar     . alendronate (FOSAMAX) 70 MG tablet TAKE 1 TABLET ONCE A WEEK WITH A FULL GLASS OF WATER ON AN EMPTY STOMACH 12 tablet 1  . amLODipine (NORVASC) 5 MG tablet Take 1 tablet (5 mg total) by mouth daily. 30 tablet 0  . atorvastatin (LIPITOR) 10 MG tablet Take 1 tablet (10 mg total) by mouth daily. Short term supply 14 tablet 0  . calcium-vitamin D (OSCAL WITH D) 500-200  MG-UNIT tablet Take 1 tablet by mouth daily.     . Diclofenac Sodium 2 % SOLN Apply 1 pump twice daily. (Patient taking differently: Apply 1 application topically 2 (two) times daily as needed (PAIN). Apply 1 pump twice daily.) 112 g 1  . fluticasone (FLONASE) 50 MCG/ACT nasal spray Place 1 spray into both nostrils daily. 16 g 2  . furosemide (LASIX) 20 MG tablet TAKE 1 TABLET DAILY 90 tablet 1  . glucose blood (ONETOUCH VERIO) test strip Use one strip to check blood sugar daily 100 each 4  . HYDROmorphone (DILAUDID) 2 MG tablet Take 1 tablet (2 mg total) by mouth every 4 (four) hours as needed for severe pain. 45 tablet 0  . Insulin Pen Needle (NOVOFINE) 32G X 6 MM MISC Use daily with Saxenda 100 each 1  . Linaclotide (LINZESS) 145 MCG CAPS capsule Take 1 capsule (145 mcg total) by mouth daily. (Patient taking differently: Take 145 mcg by mouth daily as needed (CONSTIPATION). ) 90 capsule 1  . Lorcaserin HCl 10 MG TABS Take 10 mg 2 (two) times daily by mouth. 60 tablet 0  . losartan (COZAAR) 100 MG tablet Take 1 tablet (100 mg total) by mouth daily. 30 tablet 0  . methocarbamol (ROBAXIN) 500 MG tablet Take 1 tablet (500 mg total) by mouth every 6 (six) hours as needed for muscle spasms. 30 tablet 2  . NONFORMULARY OR COMPOUNDED ITEM [facility]  Onychomycosis Nail Lacquer -  Fluconazole 2%, Terbinafine 1% DMSO Apply to affected nail once daily Qty. 120 gm 3 refills 1 each 2  . nystatin (MYCOSTATIN) 100000 UNIT/ML suspension Take 5 mLs by mouth 3 (three) times daily. SWISH AND SWALLOW    . ondansetron (ZOFRAN) 4 MG tablet Take 1-2 tablets (4-8 mg total) by mouth every 8 (eight) hours as needed for nausea or vomiting. 40 tablet 0  . ONETOUCH DELICA LANCETS 56E MISC 1 each by Does not apply route daily. Use to help check blood sugars once daily Dx 250.00 100 each 3  . Prenatal Vit-Fe Fumarate-FA (PNV PRENATAL PLUS MULTIVITAMIN) 27-1 MG TABS TAKE 1 TABLET DAILY AT 12 NOON 60 tablet 3  .  propranolol (INDERAL) 80 MG tablet TAKE 1 TABLET TWICE A DAY 180 tablet 1  . terbinafine (LAMISIL) 250 MG tablet     . zolpidem (AMBIEN) 5 MG tablet Take 1 tablet (5 mg total) by mouth at bedtime as needed for sleep. 30 tablet 0   No current facility-administered medications on file prior to visit.     ALLERGIES: Allergies  Allergen Reactions  . Sertraline Hcl Other (See Comments)    Spasms; numbness  . Ace Inhibitors Cough  . Codeine Palpitations  . Darifenacin Hydrobromide Er Other (See Comments)    Pt states made her feel queezy & drunk  . Gabapentin Other (See  Comments)    Hallucinations  . Pravastatin Other (See Comments)    myalgias  . Propoxyphene Hcl Palpitations  . Wellbutrin [Bupropion Hcl] Other (See Comments)    Numbness of mouth/lips    FAMILY HISTORY: Family History  Problem Relation Age of Onset  . Diabetes Mother   . Kidney disease Father   . Prostate cancer Father   . Colitis Brother   . Leukemia Brother   . Multiple sclerosis Sister   . Colon polyps Unknown   . Breast cancer Maternal Aunt   . Breast cancer Cousin   . Colon cancer Neg Hx     SOCIAL HISTORY: Social History   Socioeconomic History  . Marital status: Married    Spouse name: Not on file  . Number of children: Not on file  . Years of education: Not on file  . Highest education level: Not on file  Social Needs  . Financial resource strain: Not on file  . Food insecurity - worry: Not on file  . Food insecurity - inability: Not on file  . Transportation needs - medical: Not on file  . Transportation needs - non-medical: Not on file  Occupational History  . 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 Smoking status: Never Smoker  . Smokeless tobacco: Never Used  Substance and Sexual Activity  . Alcohol use: No    Comment: rare  . Drug use: No  . Sexual activity: No  Other Topics Concern  . Not on file  Social History  Narrative  . Not on file    REVIEW OF SYSTEMS: Constitutional: No fevers, chills, or sweats, no generalized fatigue, change in appetite Eyes: No visual changes, double vision, eye pain Ear, nose and throat: No hearing loss, ear pain, nasal congestion, sore throat Cardiovascular: No chest pain, palpitations Respiratory:  No shortness of breath at rest or with exertion, wheezes GastrointestinaI: No nausea, vomiting, diarrhea, abdominal pain, fecal incontinence Genitourinary:  No dysuria, urinary retention or frequency Musculoskeletal:  No neck pain, back pain Integumentary: No rash, pruritus, skin lesions Neurological: as above Psychiatric: No depression, insomnia, anxiety Endocrine: No palpitations, fatigue, diaphoresis, mood swings, change in appetite, change in weight, increased thirst Hematologic/Lymphatic:  No purpura, petechiae. Allergic/Immunologic: no itchy/runny eyes, nasal congestion, recent allergic reactions, rashes  PHYSICAL EXAM: Vitals:   05/14/17 0838  BP: 130/80  Pulse: 64  SpO2: 99%  BP supine 1 General: No acute distress.  Patient appears well-groomed.  Head:  Normocephalic/atraumatic Eyes:  Fundi examined but not visualized Neck: supple, no paraspinal tenderness, full range of motion Heart:  Regular rate and rhythm Lungs:  Clear to auscultation bilaterally Back: No paraspinal tenderness Neurological Exam: alert and oriented to person, place, and time. Attention span and concentration intact, recent and remote memory intact, fund of knowledge intact.  Speech fluent and not dysarthric, language intact.  CN II-XII intact. Bulk and tone normal, muscle strength 5/5 throughout.  Sensation to pinprick reduced in feet, vibration intact.  Deep tendon reflexes absent throughout.  Finger to nose testing intact.  Antalgic gait (secondary to recent right knee surgery)  IMPRESSION: 1.  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  Follow  low-carb/low sugar diet 2.  Routine exercise 3.  Continue Lyrica 100mg  twice daily 4.  Follow up in 6 months.  25 minutes spent face to face with patient, over 50% spent discussing management and diagnosis.  Metta Clines, DO  CC:  Billey Gosling, MD

## 2017-05-14 NOTE — Patient Instructions (Signed)
1.  Continue Lyrica 100mg  twice daily 2.  Try to work on diet and exercise 3.  Follow up in 6 months.

## 2017-05-15 ENCOUNTER — Other Ambulatory Visit: Payer: Self-pay

## 2017-05-15 ENCOUNTER — Ambulatory Visit (HOSPITAL_COMMUNITY): Payer: BLUE CROSS/BLUE SHIELD | Attending: Internal Medicine

## 2017-05-15 DIAGNOSIS — E785 Hyperlipidemia, unspecified: Secondary | ICD-10-CM | POA: Insufficient documentation

## 2017-05-15 DIAGNOSIS — I890 Lymphedema, not elsewhere classified: Secondary | ICD-10-CM | POA: Insufficient documentation

## 2017-05-15 DIAGNOSIS — I10 Essential (primary) hypertension: Secondary | ICD-10-CM | POA: Diagnosis not present

## 2017-05-15 DIAGNOSIS — E119 Type 2 diabetes mellitus without complications: Secondary | ICD-10-CM | POA: Diagnosis not present

## 2017-05-15 LAB — ECHOCARDIOGRAM COMPLETE
Ao-asc: 30 cm
E decel time: 282 msec
E/e' ratio: 9.09
FS: 33 % (ref 28–44)
IVS/LV PW RATIO, ED: 0.96
LA ID, A-P, ES: 36 mm
LA diam end sys: 36 mm
LA diam index: 1.74 cm/m2
LA vol A4C: 43 ml
LA vol index: 27.1 mL/m2
LA vol: 56 mL
LV E/e' medial: 9.09
LV E/e'average: 9.09
LV PW d: 11.3 mm — AB (ref 0.6–1.1)
LV dias vol index: 50 mL/m2
LV dias vol: 104 mL (ref 46–106)
LV e' LATERAL: 7.6 cm/s
LV sys vol index: 25 mL/m2
LV sys vol: 51 mL — AB (ref 14–42)
LVOT SV: 83 mL
LVOT VTI: 21.9 cm
LVOT area: 3.8 cm2
LVOT diameter: 22 mm
LVOT peak grad rest: 4 mmHg
LVOT peak vel: 101 cm/s
Lateral S' vel: 12.8 cm/s
MV Dec: 282
MV pk A vel: 86.4 m/s
MV pk E vel: 69.1 m/s
RV sys press: 27 mmHg
Reg peak vel: 247 cm/s
Simpson's disk: 51
Stroke v: 53 ml
TDI e' lateral: 7.6
TDI e' medial: 5.85
TR max vel: 247 cm/s

## 2017-05-18 ENCOUNTER — Encounter: Payer: Self-pay | Admitting: Internal Medicine

## 2017-05-18 DIAGNOSIS — I517 Cardiomegaly: Secondary | ICD-10-CM

## 2017-05-18 HISTORY — DX: Cardiomegaly: I51.7

## 2017-05-20 ENCOUNTER — Ambulatory Visit: Payer: BLUE CROSS/BLUE SHIELD | Attending: Internal Medicine | Admitting: Physical Therapy

## 2017-05-20 DIAGNOSIS — R600 Localized edema: Secondary | ICD-10-CM | POA: Insufficient documentation

## 2017-05-20 DIAGNOSIS — R262 Difficulty in walking, not elsewhere classified: Secondary | ICD-10-CM | POA: Diagnosis not present

## 2017-05-20 NOTE — Therapy (Addendum)
[place], [place], [address] Phone: [phone]   Fax:  [phone]  Physical Therapy Evaluation  Patient Details  Name: Janet Le MRN: [id number] Date of Birth: 30-Sep-1955 Referring Provider: Dr. Billey Gosling   Encounter Date: 05/20/2017  PT End of Session - 05/20/17 1711    Visit Number  1    Number of Visits  7    Date for PT Re-Evaluation  06/24/17    PT Start Time  7356    PT Stop Time  1655    PT Time Calculation (min)  49 min    Activity Tolerance  Patient tolerated treatment well    Behavior During Therapy  [facility] for tasks assessed/performed       Past Medical History:  Diagnosis Date  . Allergic rhinitis, cause unspecified   . Anxiety   . Blood dyscrasia    platelets low in past  . Chronic back pain   . Chronic constipation   . Chronic sinusitis   . Depression   . Endometriosis   . ENDOMETRIOSIS 05/06/2009   Qualifier: Diagnosis of  By: Varney Daily RN, Butch Penny    . Esophageal stricture   . GERD (gastroesophageal reflux disease)   . Hemorrhoids   . Hiatal hernia   . Hyperlipidemia   . Hypertension   . Irritable bowel syndrome   . Osteoarthritis   . Personal history of diabetes mellitus    controlled with diet only  . Renal cell cancer ([place]) 11/2010 dx   R, s/p cryoablation 02/16/11  . Syncopal episodes    since childhood    Past Surgical History:  Procedure Laterality Date  . BREAST CYST EXCISION Bilateral    No visable scar   . BREAST SURGERY     bilateral, cyst removal  . fallopian tube removed    . FUNCTIONAL ENDOSCOPIC SINUS SURGERY    . LASER ABLATION OF THE CERVIX    . 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 TOTAL KNEE ARTHROPLASTY Right 10/30/2016   Procedure: RIGHT TOTAL KNEE ARTHROPLASTY;  Surgeon: Meredith Pel, MD;  Location: [facility];  Service: Orthopedics;  Laterality: Right;  . TUBAL LIGATION      There were no vitals  filed for this visit.   Subjective Assessment - 05/20/17 1609    Subjective  I guess I'm here for the evaluation and to see if you guys can help me with my lymphedema. She has learned to do self-massage; her mother has a pump and she hasn't tried one of those.  She wears knee-high compression stockings; has had sleeves and gloves too.  Stockings hurt at ankle and posterior knee.  She can get them on and off okay. Got her compression stockings at the outlet in [place], but doesn't know the compression level.  She does have one stocking that comes up over her knee.    Pertinent History  Has had swelling in both legs, left is larger than the right.  Pt. fell in 1997 and hurt her right ankle, so was going to the doctor for that and was diagnosed with lymphedema; did therapy at that time in [place] for a while. Had therapy elsewhere in [place] after moving back here, but it seems to be getting worse here.  Pt. reports that LE swelling is familial. Had an echocardiogram last week and doesn't have the results yet. Had right TKA this past May and uses a cane  because of jointpain all over; doesn't use it in the house.  Possibly pre-diabetic. and has this running in the family.  HTN controlled with meds; back pain; arthritis.  May need left TKA.  Renal cell carcinoma 2013; pt. says she has cryotherapy for that and was then followed for five years.    Patient Stated Goals  get back with doing the massages correctly    Currently in Pain?  No/denies sometimes in legs like a needle stick    Aggravating Factors   doesn't know    Pain Relieving Factors  time         [facility] PT Assessment - 05/20/17 0001      Assessment   Medical Diagnosis  LE lymphedema    Referring Provider  Dr. Billey Gosling    Hand Dominance  Right      Precautions   Precautions  Fall      Restrictions   Weight Bearing Restrictions  No      Balance Screen   Has the patient fallen in the past 6 months  Yes    How many times?  1 fell  out of the bed    Has the patient had a decrease in activity level because of a fear of falling?   Yes somewhat; is afraid her knees might buckle      Home Environment   Living Environment  Private residence    Living Arrangements  Other relatives grandson lives with her    Type of [place]  One level      Prior Function   Level of Independence  Independent needs to stop to rest with activity    Vocation  -- trying to get disability    Leisure  does TKA exercises intermittently      Cognition   Overall Cognitive Status  Within Functional Limits for tasks assessed      Observation/Other Assessments   Skin Integrity  right posteromedial knee has an almost-healed wound from tape at time of TKA; pt. went to wound center about that    Other Surveys   -- lymphedema life impact scale score is 22 = 32% impairment      AROM   Overall AROM Comments  LEs functional, including right knee (TKA) flexion and extension      Ambulation/Gait   Ambulation/Gait  Yes    Ambulation/Gait Assistance  6: Modified independent (Device/Increase time)    Assistive device  Straight cane Hurry-cane        LYMPHEDEMA/ONCOLOGY QUESTIONNAIRE - 05/20/17 1634      Type   Cancer Type  renal cell carcinoma, but unrelated to lymphedema      What other symptoms do you have   Are you having pitting edema  -- very mild on left ankle    Stemmer Sign  -- very mild bilat.      Lymphedema Assessments   Lymphedema Assessments  Lower extremities      Right Lower Extremity Lymphedema   20 cm Proximal to Suprapatella  75.8 cm    10 cm Proximal to Suprapatella  69.7 cm    At Midpatella/Popliteal Crease  48.8 cm    30 cm Proximal to Floor at Lateral Plantar Foot  45.7 cm    20 cm Proximal to Floor at Lateral Plantar Foot  35.7 1    10  cm Proximal to Floor at Lateral Malleoli  28.3 cm    5 cm Proximal to 1st  MTP Joint  25.9 cm    Across MTP Joint  25.7 cm    Around Proximal Great Toe  9.7 cm       Left Lower Extremity Lymphedema   20 cm Proximal to Suprapatella  78 cm    10 cm Proximal to Suprapatella  73.4 cm    At Midpatella/Popliteal Crease  54.5 cm    30 cm Proximal to Floor at Lateral Plantar Foot  46.6 cm    20 cm Proximal to Floor at Lateral Plantar Foot  35.8 cm    10 cm Proximal to Floor at Lateral Malleoli  29.7 cm    5 cm Proximal to 1st MTP Joint  27 cm    Across MTP Joint  26.8 cm    Around Proximal Great Toe  9.8 cm          Objective measurements completed on examination: See above findings.              PT Education - 05/20/17 1710    Education provided  Yes    Education Details  began discussing pump, different daytime garment, and nighttime garment options that need to be explored with her during treatment    Person(s) Educated  Patient    Methods  Explanation    Comprehension  Verbalized understanding;Need further instruction       PT Short Term Goals - 04/04/17 1151      PT SHORT TERM GOAL #1   Title  She will be independent with inital HEP    Time  4    Period  Weeks    Status  Achieved      PT SHORT TERM GOAL #2   Time  4    Period  Weeks    Status  Achieved      PT SHORT TERM GOAL #3   Title  she will be walking in home without device    Time  4    Period  Weeks    Status  Achieved      PT SHORT TERM GOAL #4   Title  She will report decr pain 50% with activity on feet.     Time  4    Period  Weeks    Status  Achieved        PT Long Term Goals - 04/04/17 1154      PT LONG TERM GOAL #1   Title  She will be independent with all HEP issued    Time  12    Status  Achieved      PT LONG TERM GOAL #2   Title  She will report pain as intermitant     Time  12    Period  Weeks    Status  Achieved      PT LONG TERM GOAL #3   Title  She will return to normal home tasks with 1-2 max pain    Time  12    Period  Weeks    Status  Achieved      PT LONG TERM GOAL #4   Title  She will walk without device in and out  of home    Baseline  pt able to amb without SPC and reports she using SPC     Time  12    Period  Weeks    Status  Partially Met      PT LONG TERM GOAL #5   Title  She will improve hip  abduction strength to 4/5 or better to imptrove stability on feet.     Time  12    Period  Weeks    Status  Achieved      PT LONG TERM GOAL #6   Title  FOTO score improved to < 40% limited    Baseline  54% limited  01/08/17    Time  12    Period  Weeks    Status  Achieved        Long Term Clinic Goals - 05/20/17 1720      CC Long Term Goal  #1   Title  Pt. will be independent in correctly performing self-manual lymph drainage to legs.    Time  4    Period  Weeks    Status  New      CC Long Term Goal  #2   Title  Pt. will be knowledgeable about lymphedema pump options, where and how to obtain.    Time  4    Period  Weeks    Status  New      CC Long Term Goal  #3   Title  Pt. will be knowledgeable about nighttime compression garment options, where and how to obtain.    Time  4    Period  Weeks    Status  New      CC Long Term Goal  #4   Title  Pt. will be knowledgeable about daytime compression garment options, including flat knit off the shelf or custom that may not roll up and be uncomfortable for her.    Time  4    Period  Weeks    Status  New          Plan - 05/20/17 1711    Clinical Impression Statement  This is a pleasant woman who reports a 33 year h/o leg lymphedema.  She reports swelling going up into her thighs that is worse on left than right leg, and that she has had an exacerbation of swelling.  She has undergone treatment twice in the past for this, once in [place] and once elsewhere in [place].  She is wearing knee-high stockings today but has some problems with the tops rolling and getting uncomfortable. She has learned self-manual lymph drainage in the past; she is aware of lymphedema pumps but does not have one. She does not have nighttime compression garments.   She uses a cane for safe gait.    History and Personal Factors relevant to plan of care:  Right TKA May 2018; h/o wound at posteromedial right knee that required her to go to the wound care center; uses cane for safe gait; arthritis    Clinical Presentation  Evolving    Clinical Presentation due to:  she reports swelling has been worsening of late    Clinical Decision Making  Moderate    Rehab Potential  Good    PT Frequency  2x / week    PT Duration  2 weeks then 1x/wk x 2 weeks prn    PT Treatment/Interventions  ADLs/Self Care Home Management;DME Instruction;Gait training;Therapeutic exercise;Patient/family education;Orthotic Fit/Training;Manual techniques;Manual lymph drainage;Taping    PT Next Visit Plan  Review self-manual lymph drainage that she learned elsewhere; begin educating about lymphedema pump options, nighttime compression garments, and perhaps flat knit options for daytime compression knee- or thigh-highs Also consider velcro garments such as Farrow wrap, Juzo, or Medi reduction kit.    Consulted and Agree with Plan of Care  Patient       Patient will benefit from skilled therapeutic intervention in order to improve the following deficits and impairments:  Increased edema, Decreased knowledge of precautions, Decreased knowledge of use of DME, Abnormal gait, Pain  Visit Diagnosis: Localized edema - Plan: PT plan of care cert/re-cert  Difficulty in walking, not elsewhere classified - Plan: PT plan of care cert/re-cert     Problem List Patient Active Problem List   Diagnosis Date Noted  . LVH (left ventricular hypertrophy), mild 05/18/2017  . Acute intractable headache 05/07/2017  . Ulcer of right lower extremity ([place]) 11/29/2016  . Arthritis of knee 10/30/2016  . S/P knee surgery 10/30/2016  . Burning pain 10/15/2016  . Low back pain 08/01/2016  . Chronic low back pain 07/18/2016  . Prediabetes 05/30/2016  . Cervicalgia 05/30/2016  . Right knee pain 05/30/2016  .  Osteopenia 11/18/2015  . Thrombocytopenia ([place]) 05/30/2015  . Arthritis of left lower extremity 02/02/2015  . Left knee pain 09/08/2014  . Gout 12/11/2013  . Obesity with serious comorbidity 11/06/2013  . Osteoporosis   . Left lumbar radiculopathy 04/13/2012  . Renal cell cancer ([place])   . External hemorrhoid 11/14/2010  . IBS (irritable bowel syndrome) 11/14/2010  . GERD (gastroesophageal reflux disease) 11/14/2010  . HYPERCHOLESTEROLEMIA, MILD 03/16/2010  . Lymphedema 03/15/2010  . ALLERGIC RHINITIS 08/31/2009  . MIGRAINE HEADACHE 06/14/2009  . SINUSITIS, CHRONIC 06/14/2009  . SYNCOPE 05/16/2009  . Essential hypertension 05/06/2009  . CONSTIPATION, CHRONIC 05/06/2009    SALISBURY,DONNA 05/20/2017, 5:28 PM  [place], [place], [address] Phone: [phone]   Fax:  [phone]  Name: Janet Le MRN: [id number] Date of Birth: June 17, 1956  Serafina Royals, PT 05/20/17 5:28 PM

## 2017-05-22 ENCOUNTER — Telehealth: Payer: Self-pay | Admitting: Internal Medicine

## 2017-05-22 ENCOUNTER — Other Ambulatory Visit: Payer: Self-pay | Admitting: Internal Medicine

## 2017-05-22 NOTE — Telephone Encounter (Signed)
Spoke with pt to inform that RX was sent to [facility] for a 30 day trial before sending to mail order.

## 2017-05-22 NOTE — Telephone Encounter (Signed)
Copied from [place] [id number]. Topic: Referral - Request >> May 22, 2017 12:55 PM [place], Laban Emperor wrote: Reason for CRM: Pt states she would like a referral to see a cardiologist.

## 2017-05-22 NOTE — Telephone Encounter (Signed)
Copied from [place] [id number]. Topic: Quick Communication - Rx Refill/Question >> May 22, 2017  1:25 PM Marin Olp L wrote: Has the patient contacted their pharmacy? Yes.   (Agent: If no, request that the patient contact the pharmacy for the refill.) Preferred Pharmacy (with phone number or street name): [facility] Agent: Please be advised that RX refills may take up to 48 hours. We ask that you follow-up with your pharmacy.  Patient says a PA was needed for "velvite" a weight loss medication.

## 2017-05-22 NOTE — Telephone Encounter (Signed)
Pharmacy did not receive this. Can you resend it?

## 2017-05-22 NOTE — Telephone Encounter (Signed)
Left a message on pt.'s phone for verification on the weight loss medicine she needs prior authorization for. The prior message said "velvite."

## 2017-05-22 NOTE — Telephone Encounter (Signed)
I can but I need to know why

## 2017-05-24 NOTE — Telephone Encounter (Signed)
Spoke with pt, advised pt that after getting the echo results back Dr Quay Burow did not iontend for her to see cardiology. Pt is going to hold off on referral now.

## 2017-05-30 ENCOUNTER — Ambulatory Visit: Payer: BLUE CROSS/BLUE SHIELD | Attending: Internal Medicine

## 2017-05-30 DIAGNOSIS — M25561 Pain in right knee: Secondary | ICD-10-CM | POA: Diagnosis not present

## 2017-05-30 DIAGNOSIS — M25661 Stiffness of right knee, not elsewhere classified: Secondary | ICD-10-CM | POA: Diagnosis not present

## 2017-05-30 DIAGNOSIS — R600 Localized edema: Secondary | ICD-10-CM | POA: Diagnosis not present

## 2017-05-30 DIAGNOSIS — Z96651 Presence of right artificial knee joint: Secondary | ICD-10-CM | POA: Insufficient documentation

## 2017-05-30 DIAGNOSIS — R262 Difficulty in walking, not elsewhere classified: Secondary | ICD-10-CM | POA: Insufficient documentation

## 2017-05-30 NOTE — Patient Instructions (Signed)
Start with circles near neck, above collarbones 10 times.   Cancer Rehab [phone] Deep Effective Breath   Standing, sitting, or laying down place both hands on the belly. Take a deep breath IN, expanding the belly; then breath OUT, contracting the belly. Repeat __5__ times. Do __2-3__ sessions per day and before each self massage.  http://gt2.exer.us/866   Copyright  VHI. All rights reserved.  Inguinal Nodes to Axilla - Clear   On involved side, at armpit, make _5__ in-place circles. Then from hip proceed in sections to armpit with stationary circles or pumps _5_ times, this is your pathway. Do _1__ time per day.  Copyright  VHI. All rights reserved.  LEG: Knee to Hip - Clear   Pump up outer thigh of involved leg from knee to outer hip. Then do stationary circles from inner to outer thigh, then do outer thigh again. Next, interlace fingers behind knee IF ABLE and make in-place circles. Do _5_ times of each sequence.  Do _1__ time per day.  Copyright  VHI. All rights reserved.  LEG: Ankle to Hip Sweep   Hands on sides of ankle of involved leg, pump _5__ times up both sides of lower leg, then retrace steps up outer thigh to hip as before and back to pathway. Do _2-3_ times. Do __1_ time per day.  Copyright  VHI. All rights reserved.  FOOT: Dorsum of Foot and Toes Massage   One hand on top of foot make _5_ stationary circles or pumps, then either on top of toes or each individual toe do _5_ pumps. Then retrace all steps pumping back up both sides of lower leg, outer thigh, and then pathway. Finish with what you started with, _5_ circles at involved side arm pit. All _2-3_ times at each sequence. Do _1__ time per day.  Copyright  VHI. All rights reserved.

## 2017-05-30 NOTE — Therapy (Signed)
[place], [place], [address] Phone: [phone]   Fax:  [phone]  Physical Therapy Treatment  Patient Details  Name: Janet Le MRN: [id number] Date of Birth: 03/11/1956 Referring Provider: Dr. Billey Gosling   Encounter Date: 05/30/2017  PT End of Session - 05/30/17 1108    Visit Number  2    Number of Visits  7    Date for PT Re-Evaluation  06/24/17    PT Start Time  1020    PT Stop Time  1106    PT Time Calculation (min)  46 min    Activity Tolerance  Patient tolerated treatment well    Behavior During Therapy  [facility] for tasks assessed/performed       Past Medical History:  Diagnosis Date  . Allergic rhinitis, cause unspecified   . Anxiety   . Blood dyscrasia    platelets low in past  . Chronic back pain   . Chronic constipation   . Chronic sinusitis   . Depression   . Endometriosis   . ENDOMETRIOSIS 05/06/2009   Qualifier: Diagnosis of  By: Varney Daily RN, Butch Penny    . Esophageal stricture   . GERD (gastroesophageal reflux disease)   . Hemorrhoids   . Hiatal hernia   . Hyperlipidemia   . Hypertension   . Irritable bowel syndrome   . Osteoarthritis   . Personal history of diabetes mellitus    controlled with diet only  . Renal cell cancer ([place]) 11/2010 dx   R, s/p cryoablation 02/16/11  . Syncopal episodes    since childhood    Past Surgical History:  Procedure Laterality Date  . BREAST CYST EXCISION Bilateral    No visable scar   . BREAST SURGERY     bilateral, cyst removal  . fallopian tube removed    . FUNCTIONAL ENDOSCOPIC SINUS SURGERY    . LASER ABLATION OF THE CERVIX    . 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 TOTAL KNEE ARTHROPLASTY Right 10/30/2016   Procedure: RIGHT TOTAL KNEE ARTHROPLASTY;  Surgeon: Meredith Pel, MD;  Location: [facility];  Service: Orthopedics;  Laterality: Right;  . TUBAL LIGATION      There were no vitals filed  for this visit.  Subjective Assessment - 05/30/17 1031    Subjective  Ready to see if we can get the swelling better in my legs. [place] last Friday playing with my grandsons, my body was just sore. I haven't called the doctor about it yet. Dr. Celso Amy gave me a new med for weight loss [facility]?), pt to bring med next time to add to list.     Pertinent History  Has had swelling in both legs, left is larger than the right.  Pt. fell in 1997 and hurt her right ankle, so was going to the doctor for that and was diagnosed with lymphedema; did therapy at that time in [place] for a while. Had therapy elsewhere in [place] after moving back here, but it seems to be getting worse here.  Pt. reports that LE swelling is familial. Had an echocardiogram last week and doesn't have the results yet. Had right TKA this past May and uses a cane because of jointpain all over; doesn't use it in the house.  Possibly pre-diabetic. and has this running in the family.  HTN controlled with meds; back pain; arthritis.  May need left TKA.  Renal cell carcinoma  2013; pt. says she has cryotherapy for that and was then followed for five years.    Patient Stated Goals  get back with doing the massages correctly    Currently in Pain?  No/denies                      [facility] Adult PT Treatment/Exercise - 05/30/17 0001      Manual Therapy   Manual Therapy  Manual Lymphatic Drainage (MLD)    Manual Lymphatic Drainage (MLD)  In Supine instructing pt throughout: Short neck, 5 diaphragmatic breaths, Lt axillary nodes, Lt inguino-axillary anastomosis, and Lt LE from lateral thigh to dorsal foot working from proximal to distal then retracing all steps.              PT Education - 05/30/17 1105    Education provided  Yes    Education Details  Self manual lymph drainage    Person(s) Educated  Patient    Methods  Explanation;Demonstration;Handout    Comprehension  Verbalized understanding;Need further  instruction       PT Short Term Goals - 04/04/17 1151      PT SHORT TERM GOAL #1   Title  She will be independent with inital HEP    Time  4    Period  Weeks    Status  Achieved      PT SHORT TERM GOAL #2   Time  4    Period  Weeks    Status  Achieved      PT SHORT TERM GOAL #3   Title  she will be walking in home without device    Time  4    Period  Weeks    Status  Achieved      PT SHORT TERM GOAL #4   Title  She will report decr pain 50% with activity on feet.     Time  4    Period  Weeks    Status  Achieved        PT Long Term Goals - 04/04/17 1154      PT LONG TERM GOAL #1   Title  She will be independent with all HEP issued    Time  12    Status  Achieved      PT LONG TERM GOAL #2   Title  She will report pain as intermitant     Time  12    Period  Weeks    Status  Achieved      PT LONG TERM GOAL #3   Title  She will return to normal home tasks with 1-2 max pain    Time  12    Period  Weeks    Status  Achieved      PT LONG TERM GOAL #4   Title  She will walk without device in and out of home    Baseline  pt able to amb without SPC and reports she using SPC     Time  12    Period  Weeks    Status  Partially Met      PT LONG TERM GOAL #5   Title  She will improve hip abduction strength to 4/5 or better to imptrove stability on feet.     Time  12    Period  Weeks    Status  Achieved      PT LONG TERM GOAL #6   Title  FOTO score improved to <  40% limited    Baseline  54% limited  01/08/17    Time  12    Period  Weeks    Status  Achieved        Long Term Clinic Goals - 05/20/17 1720      CC Long Term Goal  #1   Title  Pt. will be independent in correctly performing self-manual lymph drainage to legs.    Time  4    Period  Weeks    Status  New      CC Long Term Goal  #2   Title  Pt. will be knowledgeable about lymphedema pump options, where and how to obtain.    Time  4    Period  Weeks    Status  New      CC Long Term Goal  #3    Title  Pt. will be knowledgeable about nighttime compression garment options, where and how to obtain.    Time  4    Period  Weeks    Status  New      CC Long Term Goal  #4   Title  Pt. will be knowledgeable about daytime compression garment options, including flat knit off the shelf or custom that may not roll up and be uncomfortable for her.    Time  4    Period  Weeks    Status  New         Plan - 05/30/17 1108    Clinical Impression Statement  Pt did well with initial instruction of manual lymph drainage and basics of anatomy of lymphatic system. Issued handout to pt for self MLD for her to try at home before next visit. Briefly discussed garment options with pt and wear to go ([employer] [facility] as they accept [employer]) and will send order to Dr. Quay Burow today for garments and pump.    Rehab Potential  Good    PT Frequency  2x / week    PT Duration  2 weeks 1x/wk x2 wks prn    PT Treatment/Interventions  ADLs/Self Care Home Management;DME Instruction;Gait training;Therapeutic exercise;Patient/family education;Orthotic Fit/Training;Manual techniques;Manual lymph drainage;Taping    PT Next Visit Plan  Review self-manual lymph drainage; begin educating about lymphedema pump options, nighttime compression garments, and perhaps flat knit options for daytime compression knee- or thigh-highs Also consider velcro garments such as Farrow wrap, Juzo, or Medi reduction kit. Issue script if returned signed.     Recommended Other Services  Sent script to Dr. Quay Burow today for compression garments and pump.    Consulted and Agree with Plan of Care  Patient       Patient will benefit from skilled therapeutic intervention in order to improve the following deficits and impairments:  Increased edema, Decreased knowledge of precautions, Decreased knowledge of use of DME, Abnormal gait, Pain  Visit Diagnosis: Localized edema  Difficulty in walking, not elsewhere classified     Problem List Patient Active  Problem List   Diagnosis Date Noted  . LVH (left ventricular hypertrophy), mild 05/18/2017  . Acute intractable headache 05/07/2017  . Ulcer of right lower extremity ([place]) 11/29/2016  . Arthritis of knee 10/30/2016  . S/P knee surgery 10/30/2016  . Burning pain 10/15/2016  . Low back pain 08/01/2016  . Chronic low back pain 07/18/2016  . Prediabetes 05/30/2016  . Cervicalgia 05/30/2016  . Right knee pain 05/30/2016  . Osteopenia 11/18/2015  . Thrombocytopenia ([place]) 05/30/2015  . Arthritis of left lower extremity 02/02/2015  .  Left knee pain 09/08/2014  . Gout 12/11/2013  . Obesity with serious comorbidity 11/06/2013  . Osteoporosis   . Left lumbar radiculopathy 04/13/2012  . Renal cell cancer ([place])   . External hemorrhoid 11/14/2010  . IBS (irritable bowel syndrome) 11/14/2010  . GERD (gastroesophageal reflux disease) 11/14/2010  . HYPERCHOLESTEROLEMIA, MILD 03/16/2010  . Lymphedema 03/15/2010  . ALLERGIC RHINITIS 08/31/2009  . MIGRAINE HEADACHE 06/14/2009  . SINUSITIS, CHRONIC 06/14/2009  . SYNCOPE 05/16/2009  . Essential hypertension 05/06/2009  . CONSTIPATION, CHRONIC 05/06/2009    Otelia Limes, PTA 05/30/2017, 11:11 AM  [place], [place], [address] Phone: [phone]   Fax:  [phone]  Name: Janet Le MRN: [id number] Date of Birth: [birth year]

## 2017-05-31 ENCOUNTER — Ambulatory Visit: Payer: BLUE CROSS/BLUE SHIELD | Admitting: Physical Therapy

## 2017-05-31 ENCOUNTER — Encounter: Payer: Self-pay | Admitting: Physical Therapy

## 2017-05-31 ENCOUNTER — Other Ambulatory Visit: Payer: Self-pay

## 2017-05-31 DIAGNOSIS — M25561 Pain in right knee: Secondary | ICD-10-CM | POA: Diagnosis not present

## 2017-05-31 DIAGNOSIS — R262 Difficulty in walking, not elsewhere classified: Secondary | ICD-10-CM

## 2017-05-31 DIAGNOSIS — Z96651 Presence of right artificial knee joint: Secondary | ICD-10-CM | POA: Diagnosis not present

## 2017-05-31 DIAGNOSIS — M25661 Stiffness of right knee, not elsewhere classified: Secondary | ICD-10-CM | POA: Diagnosis not present

## 2017-05-31 DIAGNOSIS — R600 Localized edema: Secondary | ICD-10-CM

## 2017-05-31 NOTE — Therapy (Signed)
[place], [place], [address] Phone: [phone]   Fax:  [phone]  Physical Therapy Treatment  Patient Details  Name: Janet Le MRN: [id number] Date of Birth: 08/26/1955 Referring Provider: Dr. Billey Gosling   Encounter Date: 05/31/2017  PT End of Session - 05/31/17 1118    Visit Number  3    Number of Visits  7    Date for PT Re-Evaluation  06/24/17    PT Start Time  0930    PT Stop Time  1015    PT Time Calculation (min)  45 min       Past Medical History:  Diagnosis Date  . Allergic rhinitis, cause unspecified   . Anxiety   . Blood dyscrasia    platelets low in past  . Chronic back pain   . Chronic constipation   . Chronic sinusitis   . Depression   . Endometriosis   . ENDOMETRIOSIS 05/06/2009   Qualifier: Diagnosis of  By: Varney Daily RN, Butch Penny    . Esophageal stricture   . GERD (gastroesophageal reflux disease)   . Hemorrhoids   . Hiatal hernia   . Hyperlipidemia   . Hypertension   . Irritable bowel syndrome   . Osteoarthritis   . Personal history of diabetes mellitus    controlled with diet only  . Renal cell cancer ([place]) 11/2010 dx   R, s/p cryoablation 02/16/11  . Syncopal episodes    since childhood    Past Surgical History:  Procedure Laterality Date  . BREAST CYST EXCISION Bilateral    No visable scar   . BREAST SURGERY     bilateral, cyst removal  . fallopian tube removed    . FUNCTIONAL ENDOSCOPIC SINUS SURGERY    . LASER ABLATION OF THE CERVIX    . 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 TOTAL KNEE ARTHROPLASTY Right 10/30/2016   Procedure: RIGHT TOTAL KNEE ARTHROPLASTY;  Surgeon: Meredith Pel, MD;  Location: [facility];  Service: Orthopedics;  Laterality: Right;  . TUBAL LIGATION      There were no vitals filed for this visit.  Subjective Assessment - 05/31/17 1113    Subjective  Pt mother is coming from [place] to [place] and  will be in an assisted living, so pt will be helping to take care of her.      Pertinent History  Has had swelling in both legs, left is larger than the right.  Pt. fell in 1997 and hurt her right ankle, so was going to the doctor for that and was diagnosed with lymphedema; did therapy at that time in [place] for a while. Had therapy elsewhere in [place] after moving back here, but it seems to be getting worse here.  Pt. reports that LE swelling is familial. Had an echocardiogram last week and doesn't have the results yet. Had right TKA this past May and uses a cane because of jointpain all over; doesn't use it in the house.  Possibly pre-diabetic. and has this running in the family.  HTN controlled with meds; back pain; arthritis.  May need left TKA.  Renal cell carcinoma 2013; pt. says she has cryotherapy for that and was then followed for five years.    Currently in Pain?  No/denies                      [facility] Adult PT Treatment/Exercise - 05/31/17  0001      Self-Care   Self-Care  Other Self-Care Comments    Other Self-Care Comments   gave pt information on free  water exercise classes on saturdays and [employer] catalog for other compression options      Manual Therapy   Manual Therapy  Edema management;Manual Lymphatic Drainage (MLD)    Manual therapy comments  Reviewed self manual lymph drainage with hanourt from last session     Edema Management  gave pt information about [employer] and [employer] compression pump companies.  Pt will review over the weekend and let us know next time which one she wants to pursue     Manual Lymphatic Drainage (MLD)  In Supine instructing pt throughout: Short neck, 5 diaphragmatic breaths, Lt axillary nodes, Lt inguino-axillary anastomosis, and Lt LE from lateral thigh to dorsal foot working from proximal to distal then retracing all steps.              PT Education - 05/31/17 1118    Education provided  Yes    Education Details   compression pump options     Person(s) Educated  Patient    Methods  Explanation;Handout    Comprehension  Verbalized understanding       PT Short Term Goals - 04/04/17 1151      PT SHORT TERM GOAL #1   Title  She will be independent with inital HEP    Time  4    Period  Weeks    Status  Achieved      PT SHORT TERM GOAL #2   Time  4    Period  Weeks    Status  Achieved      PT SHORT TERM GOAL #3   Title  she will be walking in home without device    Time  4    Period  Weeks    Status  Achieved      PT SHORT TERM GOAL #4   Title  She will report decr pain 50% with activity on feet.     Time  4    Period  Weeks    Status  Achieved        PT Long Term Goals - 04/04/17 1154      PT LONG TERM GOAL #1   Title  She will be independent with all HEP issued    Time  12    Status  Achieved      PT LONG TERM GOAL #2   Title  She will report pain as intermitant     Time  12    Period  Weeks    Status  Achieved      PT LONG TERM GOAL #3   Title  She will return to normal home tasks with 1-2 max pain    Time  12    Period  Weeks    Status  Achieved      PT LONG TERM GOAL #4   Title  She will walk without device in and out of home    Baseline  pt able to amb without SPC and reports she using SPC     Time  12    Period  Weeks    Status  Partially Met      PT LONG TERM GOAL #5   Title  She will improve hip abduction strength to 4/5 or better to imptrove stability on feet.     Time  12  Period  Weeks    Status  Achieved      PT LONG TERM GOAL #6   Title  FOTO score improved to < 40% limited    Baseline  54% limited  01/08/17    Time  12    Period  Weeks    Status  Achieved        Long Term Clinic Goals - 05/20/17 1720      CC Long Term Goal  #1   Title  Pt. will be independent in correctly performing self-manual lymph drainage to legs.    Time  4    Period  Weeks    Status  New      CC Long Term Goal  #2   Title  Pt. will be knowledgeable about  lymphedema pump options, where and how to obtain.    Time  4    Period  Weeks    Status  New      CC Long Term Goal  #3   Title  Pt. will be knowledgeable about nighttime compression garment options, where and how to obtain.    Time  4    Period  Weeks    Status  New      CC Long Term Goal  #4   Title  Pt. will be knowledgeable about daytime compression garment options, including flat knit off the shelf or custom that may not roll up and be uncomfortable for her.    Time  4    Period  Weeks    Status  New         Plan - 05/31/17 1119    Clinical Impression Statement  Pt had difficulty with self manual lymph drainage and neede verbal and hand over hand cues.  She had difficulty reaching certain areas of her body.  Because of this feel that self massage may not be effective and she will need a compression pump.  Pt received information about options     PT Next Visit Plan  Review self-manual lymph drainage as needed  assess which pump company pt wants to use and send demographics. teach remedial exercise and encourage general exercise.   begin educating about nighttime compression garments, and perhaps flat knit options for daytime compression knee- or thigh-highs Also consider velcro garments such as Farrow wrap, Juzo, or Medi reduction kit. Issue script if returned signed.     Consulted and Agree with Plan of Care  Patient       Patient will benefit from skilled therapeutic intervention in order to improve the following deficits and impairments:     Visit Diagnosis: Localized edema  Difficulty in walking, not elsewhere classified  History of total knee arthroplasty, right  Stiffness of right knee, not elsewhere classified  Right knee pain, unspecified chronicity     Problem List Patient Active Problem List   Diagnosis Date Noted  . LVH (left ventricular hypertrophy), mild 05/18/2017  . Acute intractable headache 05/07/2017  . Ulcer of right lower extremity ([place])  11/29/2016  . Arthritis of knee 10/30/2016  . S/P knee surgery 10/30/2016  . Burning pain 10/15/2016  . Low back pain 08/01/2016  . Chronic low back pain 07/18/2016  . Prediabetes 05/30/2016  . Cervicalgia 05/30/2016  . Right knee pain 05/30/2016  . Osteopenia 11/18/2015  . Thrombocytopenia ([place]) 05/30/2015  . Arthritis of left lower extremity 02/02/2015  . Left knee pain 09/08/2014  . Gout 12/11/2013  . Obesity with serious comorbidity 11/06/2013  .  Osteoporosis   . Left lumbar radiculopathy 04/13/2012  . Renal cell cancer ([place])   . External hemorrhoid 11/14/2010  . IBS (irritable bowel syndrome) 11/14/2010  . GERD (gastroesophageal reflux disease) 11/14/2010  . HYPERCHOLESTEROLEMIA, MILD 03/16/2010  . Lymphedema 03/15/2010  . ALLERGIC RHINITIS 08/31/2009  . MIGRAINE HEADACHE 06/14/2009  . SINUSITIS, CHRONIC 06/14/2009  . SYNCOPE 05/16/2009  . Essential hypertension 05/06/2009  . CONSTIPATION, CHRONIC 05/06/2009   Donato Heinz. Owens Shark PT  Norwood Levo 05/31/2017, 11:23 AM  [place], [place], [address] Phone: [phone]   Fax:  [phone]  Name: Janet Le MRN: [id number] Date of Birth: February 23, 1956

## 2017-06-05 ENCOUNTER — Ambulatory Visit: Payer: BLUE CROSS/BLUE SHIELD | Admitting: Physical Therapy

## 2017-06-05 DIAGNOSIS — H0520 Unspecified exophthalmos: Secondary | ICD-10-CM | POA: Diagnosis not present

## 2017-06-06 ENCOUNTER — Ambulatory Visit: Payer: BLUE CROSS/BLUE SHIELD

## 2017-06-06 ENCOUNTER — Ambulatory Visit (INDEPENDENT_AMBULATORY_CARE_PROVIDER_SITE_OTHER): Payer: BLUE CROSS/BLUE SHIELD

## 2017-06-06 ENCOUNTER — Telehealth: Payer: Self-pay | Admitting: Neurology

## 2017-06-06 DIAGNOSIS — M25561 Pain in right knee: Secondary | ICD-10-CM | POA: Diagnosis not present

## 2017-06-06 DIAGNOSIS — M25661 Stiffness of right knee, not elsewhere classified: Secondary | ICD-10-CM | POA: Diagnosis not present

## 2017-06-06 DIAGNOSIS — Z299 Encounter for prophylactic measures, unspecified: Secondary | ICD-10-CM

## 2017-06-06 DIAGNOSIS — Z96651 Presence of right artificial knee joint: Secondary | ICD-10-CM | POA: Diagnosis not present

## 2017-06-06 DIAGNOSIS — R262 Difficulty in walking, not elsewhere classified: Secondary | ICD-10-CM

## 2017-06-06 DIAGNOSIS — R600 Localized edema: Secondary | ICD-10-CM | POA: Diagnosis not present

## 2017-06-06 NOTE — Therapy (Signed)
[place], [place], [address] Phone: [phone]   Fax:  [phone]  Physical Therapy Treatment  Patient Details  Name: Janet Le MRN: [id number] Date of Birth: 08-28-1955 Referring Provider: Dr. Billey Gosling   Encounter Date: 06/06/2017  PT End of Session - 06/06/17 1107    Visit Number  4    Number of Visits  7    Date for PT Re-Evaluation  06/24/17    PT Start Time  1020    PT Stop Time  1106    PT Time Calculation (min)  46 min    Activity Tolerance  Patient tolerated treatment well    Behavior During Therapy  [facility] for tasks assessed/performed       Past Medical History:  Diagnosis Date  . Allergic rhinitis, cause unspecified   . Anxiety   . Blood dyscrasia    platelets low in past  . Chronic back pain   . Chronic constipation   . Chronic sinusitis   . Depression   . Endometriosis   . ENDOMETRIOSIS 05/06/2009   Qualifier: Diagnosis of  By: Varney Daily RN, Butch Penny    . Esophageal stricture   . GERD (gastroesophageal reflux disease)   . Hemorrhoids   . Hiatal hernia   . Hyperlipidemia   . Hypertension   . Irritable bowel syndrome   . Osteoarthritis   . Personal history of diabetes mellitus    controlled with diet only  . Renal cell cancer ([place]) 11/2010 dx   R, s/p cryoablation 02/16/11  . Syncopal episodes    since childhood    Past Surgical History:  Procedure Laterality Date  . BREAST CYST EXCISION Bilateral    No visable scar   . BREAST SURGERY     bilateral, cyst removal  . fallopian tube removed    . FUNCTIONAL ENDOSCOPIC SINUS SURGERY    . LASER ABLATION OF THE CERVIX    . 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 TOTAL KNEE ARTHROPLASTY Right 10/30/2016   Procedure: RIGHT TOTAL KNEE ARTHROPLASTY;  Surgeon: Meredith Pel, MD;  Location: [place];  Service: Orthopedics;  Laterality: Right;  . TUBAL LIGATION      There were no vitals  filed for this visit.  Subjective Assessment - 06/06/17 1023    Subjective  I haven't got to do the massage yet much because I've been getting my mom settled in the nursing home.     Pertinent History  Has had swelling in both legs, left is larger than the right.  Pt. fell in 1997 and hurt her right ankle, so was going to the doctor for that and was diagnosed with lymphedema; did therapy at that time in [place] for a while. Had therapy elsewhere in [place] after moving back here, but it seems to be getting worse here.  Pt. reports that LE swelling is familial. Had an echocardiogram last week and doesn't have the results yet. Had right TKA this past May and uses a cane because of jointpain all over; doesn't use it in the house.  Possibly pre-diabetic. and has this running in the family.  HTN controlled with meds; back pain; arthritis.  May need left TKA.  Renal cell carcinoma 2013; pt. says she has cryotherapy for that and was then followed for five years.    Patient Stated Goals  get back with doing the massages correctly    Currently in Pain?  No/denies                      [facility] Adult PT Treatment/Exercise - 06/06/17 0001      Manual Therapy   Manual Lymphatic Drainage (MLD)  In Supine instructing pt throughout: Short neck, 5 diaphragmatic breaths, Lt axillary nodes, Lt inguino-axillary anastomosis, and Lt LE from lateral thigh to dorsal foot working from proximal to distal then retracing all steps.              PT Education - 06/06/17 1106    Education provided  Yes    Education Details  Self MLD reviewing with pt and instructing daughter.     Person(s) Educated  Patient    Methods  Explanation;Demonstration;Tactile cues;Handout;Verbal cues    Comprehension  Verbalized understanding;Returned demonstration;Need further instruction       PT Short Term Goals - 04/04/17 1151      PT SHORT TERM GOAL #1   Title  She will be independent with inital HEP    Time  4     Period  Weeks    Status  Achieved      PT SHORT TERM GOAL #2   Time  4    Period  Weeks    Status  Achieved      PT SHORT TERM GOAL #3   Title  she will be walking in home without device    Time  4    Period  Weeks    Status  Achieved      PT SHORT TERM GOAL #4   Title  She will report decr pain 50% with activity on feet.     Time  4    Period  Weeks    Status  Achieved        PT Long Term Goals - 04/04/17 1154      PT LONG TERM GOAL #1   Title  She will be independent with all HEP issued    Time  12    Status  Achieved      PT LONG TERM GOAL #2   Title  She will report pain as intermitant     Time  12    Period  Weeks    Status  Achieved      PT LONG TERM GOAL #3   Title  She will return to normal home tasks with 1-2 max pain    Time  12    Period  Weeks    Status  Achieved      PT LONG TERM GOAL #4   Title  She will walk without device in and out of home    Baseline  pt able to amb without SPC and reports she using SPC     Time  12    Period  Weeks    Status  Partially Met      PT LONG TERM GOAL #5   Title  She will improve hip abduction strength to 4/5 or better to imptrove stability on feet.     Time  12    Period  Weeks    Status  Achieved      PT LONG TERM GOAL #6   Title  FOTO score improved to < 40% limited    Baseline  54% limited  01/08/17    Time  12    Period  Weeks    Status  Achieved        Long Term  Clinic Goals - 05/20/17 1720      CC Long Term Goal  #1   Title  Pt. will be independent in correctly performing self-manual lymph drainage to legs.    Time  4    Period  Weeks    Status  New      CC Long Term Goal  #2   Title  Pt. will be knowledgeable about lymphedema pump options, where and how to obtain.    Time  4    Period  Weeks    Status  New      CC Long Term Goal  #3   Title  Pt. will be knowledgeable about nighttime compression garment options, where and how to obtain.    Time  4    Period  Weeks    Status  New       CC Long Term Goal  #4   Title  Pt. will be knowledgeable about daytime compression garment options, including flat knit off the shelf or custom that may not roll up and be uncomfortable for her.    Time  4    Period  Weeks    Status  New         Plan - 06/06/17 1107    Clinical Impression Statement  Pt and dauhter did very well with instruction of manual lymph drainage. Daughter had initial instruction and did very well returning technique requiring moderate tactile cues and hand over hand pressure, then was able to return correct demonstration. Pt has decided to go with Bio[employer] pump and would like velcro compression for nighttime garment and new flat knit garments. She and daughter plan to call [facility] to make appts for garments to be measured. Reissued self MLD handout per pts request and reviewed this briefly with daughter.     Rehab Potential  Good    PT Frequency  2x / week    PT Duration  2 weeks 1x/wk x 2 wks prn    PT Treatment/Interventions  ADLs/Self Care Home Management;DME Instruction;Gait training;Therapeutic exercise;Patient/family education;Orthotic Fit/Training;Manual techniques;Manual lymph drainage;Taping    PT Next Visit Plan  See if pt made appt to get measured for compression garments at [place]; review manual lymph drainage prn and cont this.     Recommended Other Services  Refaxed order to Dr. Quay Burow as this hasn't been returned and sent demographics sheet to [facility] for compression pump.    Consulted and Agree with Plan of Care  Patient       Patient will benefit from skilled therapeutic intervention in order to improve the following deficits and impairments:  Increased edema, Decreased knowledge of precautions, Decreased knowledge of use of DME, Abnormal gait, Pain  Visit Diagnosis: Localized edema  Difficulty in walking, not elsewhere classified     Problem List Patient Active Problem List   Diagnosis Date Noted  . LVH  (left ventricular hypertrophy), mild 05/18/2017  . Acute intractable headache 05/07/2017  . Ulcer of right lower extremity ([place]) 11/29/2016  . Arthritis of knee 10/30/2016  . S/P knee surgery 10/30/2016  . Burning pain 10/15/2016  . Low back pain 08/01/2016  . Chronic low back pain 07/18/2016  . Prediabetes 05/30/2016  . Cervicalgia 05/30/2016  . Right knee pain 05/30/2016  . Osteopenia 11/18/2015  . Thrombocytopenia ([place]) 05/30/2015  . Arthritis of left lower extremity 02/02/2015  . Left knee pain 09/08/2014  . Gout 12/11/2013  . Obesity with serious comorbidity 11/06/2013  .  Osteoporosis   . Left lumbar radiculopathy 04/13/2012  . Renal cell cancer ([place])   . External hemorrhoid 11/14/2010  . IBS (irritable bowel syndrome) 11/14/2010  . GERD (gastroesophageal reflux disease) 11/14/2010  . HYPERCHOLESTEROLEMIA, MILD 03/16/2010  . Lymphedema 03/15/2010  . ALLERGIC RHINITIS 08/31/2009  . MIGRAINE HEADACHE 06/14/2009  . SINUSITIS, CHRONIC 06/14/2009  . SYNCOPE 05/16/2009  . Essential hypertension 05/06/2009  . CONSTIPATION, CHRONIC 05/06/2009    Otelia Limes, PTA 06/06/2017, 11:47 AM  [place], [place], [address] Phone: [phone]   Fax:  [phone]  Name: KAIRY FOLSOM MRN: [id number] Date of Birth: 1955/07/29

## 2017-06-06 NOTE — Telephone Encounter (Signed)
Pt called and wanted to know if Dr Tomi Likens can write a prescription for some shoes

## 2017-06-06 NOTE — Patient Instructions (Addendum)
Cancer Rehab [phone] Start with circles near neck above collarbones 10 times.  Deep Effective Breath   Standing, sitting, or laying down place both hands on the belly. Take a deep breath IN, expanding the belly; then breath OUT, contracting the belly. Repeat __5__ times. Do __2-3__ sessions per day and before each self massage.  http://gt2.exer.us/866   Copyright  VHI. All rights reserved.  Inguinal Nodes to Axilla - Clear   On involved side, at armpit, make _5__ in-place circles. Then from hip proceed in sections to armpit with stationary circles or pumps _5_ times, this is your pathway. Do _1__ time per day.  Copyright  VHI. All rights reserved.  LEG: Knee to Hip - Clear   Pump up outer thigh of involved leg from knee to outer hip. Then do stationary circles from inner to outer thigh, then do outer thigh again. Next, interlace fingers behind knee IF ABLE and make in-place circles. Do _5_ times of each sequence.  Do _1__ time per day.  Copyright  VHI. All rights reserved.  LEG: Ankle to Hip Sweep   Hands on sides of ankle of involved leg, pump _5__ times up both sides of lower leg, then retrace steps up outer thigh to hip as before and back to pathway. Do _2-3_ times. Do __1_ time per day.  Copyright  VHI. All rights reserved.  FOOT: Dorsum of Foot and Toes Massage   One hand on top of foot make _5_ stationary circles or pumps, then either on top of toes or each individual toe do _5_ pumps. Then retrace all steps pumping back up both sides of lower leg, outer thigh, and then pathway. Finish with what you started with, _5_ circles at involved side arm pit. All _2-3_ times at each sequence. Do _1__ time per day.  Copyright  VHI. All rights reserved.

## 2017-06-06 NOTE — Progress Notes (Signed)
Shingles  Injection given.   Shakenna Herrero J Cadie Sorci, MD  

## 2017-06-07 ENCOUNTER — Encounter: Payer: Self-pay | Admitting: Physical Therapy

## 2017-06-07 ENCOUNTER — Ambulatory Visit: Payer: BLUE CROSS/BLUE SHIELD | Admitting: Internal Medicine

## 2017-06-07 ENCOUNTER — Ambulatory Visit: Payer: BLUE CROSS/BLUE SHIELD | Admitting: Physical Therapy

## 2017-06-07 DIAGNOSIS — R262 Difficulty in walking, not elsewhere classified: Secondary | ICD-10-CM

## 2017-06-07 DIAGNOSIS — Z96651 Presence of right artificial knee joint: Secondary | ICD-10-CM | POA: Diagnosis not present

## 2017-06-07 DIAGNOSIS — M25561 Pain in right knee: Secondary | ICD-10-CM | POA: Diagnosis not present

## 2017-06-07 DIAGNOSIS — R600 Localized edema: Secondary | ICD-10-CM | POA: Diagnosis not present

## 2017-06-07 DIAGNOSIS — M25661 Stiffness of right knee, not elsewhere classified: Secondary | ICD-10-CM | POA: Diagnosis not present

## 2017-06-07 NOTE — Therapy (Signed)
[place], [place], [address] Phone: [phone]   Fax:  [phone]  Physical Therapy Treatment  Patient Details  Name: Janet Le MRN: [id number] Date of Birth: 26-Aug-1955 Referring Provider: Dr. Billey Gosling   Encounter Date: 06/07/2017  PT End of Session - 06/07/17 1038    Visit Number  5    Number of Visits  7    Date for PT Re-Evaluation  06/24/17    PT Start Time  0945    PT Stop Time  1033    PT Time Calculation (min)  48 min    Activity Tolerance  Patient tolerated treatment well    Behavior During Therapy  [facility] for tasks assessed/performed       Past Medical History:  Diagnosis Date  . Allergic rhinitis, cause unspecified   . Anxiety   . Blood dyscrasia    platelets low in past  . Chronic back pain   . Chronic constipation   . Chronic sinusitis   . Depression   . Endometriosis   . ENDOMETRIOSIS 05/06/2009   Qualifier: Diagnosis of  By: Varney Daily RN, Butch Penny    . Esophageal stricture   . GERD (gastroesophageal reflux disease)   . Hemorrhoids   . Hiatal hernia   . Hyperlipidemia   . Hypertension   . Irritable bowel syndrome   . Osteoarthritis   . Personal history of diabetes mellitus    controlled with diet only  . Renal cell cancer ([place]) 11/2010 dx   R, s/p cryoablation 02/16/11  . Syncopal episodes    since childhood    Past Surgical History:  Procedure Laterality Date  . BREAST CYST EXCISION Bilateral    No visable scar   . BREAST SURGERY     bilateral, cyst removal  . fallopian tube removed    . FUNCTIONAL ENDOSCOPIC SINUS SURGERY    . LASER ABLATION OF THE CERVIX    . 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 TOTAL KNEE ARTHROPLASTY Right 10/30/2016   Procedure: RIGHT TOTAL KNEE ARTHROPLASTY;  Surgeon: Meredith Pel, MD;  Location: [place];  Service: Orthopedics;  Laterality: Right;  . TUBAL LIGATION      There were no vitals  filed for this visit.  Subjective Assessment - 06/07/17 0945    Subjective  My swelling is about the same.     Pertinent History  Has had swelling in both legs, left is larger than the right.  Pt. fell in 1997 and hurt her right ankle, so was going to the doctor for that and was diagnosed with lymphedema; did therapy at that time in [place] for a while. Had therapy elsewhere in [place] after moving back here, but it seems to be getting worse here.  Pt. reports that LE swelling is familial. Had an echocardiogram last week and doesn't have the results yet. Had right TKA this past May and uses a cane because of jointpain all over; doesn't use it in the house.  Possibly pre-diabetic. and has this running in the family.  HTN controlled with meds; back pain; arthritis.  May need left TKA.  Renal cell carcinoma 2013; pt. says she has cryotherapy for that and was then followed for five years.    Patient Stated Goals  get back with doing the massages correctly    Currently in Pain?  No/denies    Pain Score  0-No pain  [facility] Adult PT Treatment/Exercise - 06/07/17 0001      Manual Therapy   Manual Lymphatic Drainage (MLD)  In Supine: Short neck, 5 diaphragmatic breaths, Lt axillary nodes, Lt inguino-axillary anastomosis, and Lt LE from lateral thigh to dorsal foot working from proximal to distal then retracing all steps.              PT Education - 06/06/17 1106    Education provided  Yes    Education Details  Self MLD reviewing with pt and instructing daughter.     Person(s) Educated  Patient    Methods  Explanation;Demonstration;Tactile cues;Handout;Verbal cues    Comprehension  Verbalized understanding;Returned demonstration;Need further instruction       PT Short Term Goals - 04/04/17 1151      PT SHORT TERM GOAL #1   Title  She will be independent with inital HEP    Time  4    Period  Weeks    Status  Achieved      PT SHORT TERM GOAL #2   Time   4    Period  Weeks    Status  Achieved      PT SHORT TERM GOAL #3   Title  she will be walking in home without device    Time  4    Period  Weeks    Status  Achieved      PT SHORT TERM GOAL #4   Title  She will report decr pain 50% with activity on feet.     Time  4    Period  Weeks    Status  Achieved        PT Long Term Goals - 04/04/17 1154      PT LONG TERM GOAL #1   Title  She will be independent with all HEP issued    Time  12    Status  Achieved      PT LONG TERM GOAL #2   Title  She will report pain as intermitant     Time  12    Period  Weeks    Status  Achieved      PT LONG TERM GOAL #3   Title  She will return to normal home tasks with 1-2 max pain    Time  12    Period  Weeks    Status  Achieved      PT LONG TERM GOAL #4   Title  She will walk without device in and out of home    Baseline  pt able to amb without SPC and reports she using SPC     Time  12    Period  Weeks    Status  Partially Met      PT LONG TERM GOAL #5   Title  She will improve hip abduction strength to 4/5 or better to imptrove stability on feet.     Time  12    Period  Weeks    Status  Achieved      PT LONG TERM GOAL #6   Title  FOTO score improved to < 40% limited    Baseline  54% limited  01/08/17    Time  12    Period  Weeks    Status  Achieved        Long Term Clinic Goals - 05/20/17 1720      CC Long Term Goal  #1   Title  Pt. will be independent in  correctly performing self-manual lymph drainage to legs.    Time  4    Period  Weeks    Status  New      CC Long Term Goal  #2   Title  Pt. will be knowledgeable about lymphedema pump options, where and how to obtain.    Time  4    Period  Weeks    Status  New      CC Long Term Goal  #3   Title  Pt. will be knowledgeable about nighttime compression garment options, where and how to obtain.    Time  4    Period  Weeks    Status  New      CC Long Term Goal  #4   Title  Pt. will be knowledgeable about  daytime compression garment options, including flat knit off the shelf or custom that may not roll up and be uncomfortable for her.    Time  4    Period  Weeks    Status  New         Plan - 06/07/17 1040    Clinical Impression Statement  Continued with MLD to LLE. Gave pt information regarding swimming on Saturdays at a local gym as well as directions and phone number for [facility] to call and make an appointment to be measured for compression garments.     Rehab Potential  Good    PT Frequency  2x / week    PT Duration  2 weeks 1x/wk x 2 wks prn    PT Treatment/Interventions  ADLs/Self Care Home Management;DME Instruction;Gait training;Therapeutic exercise;Patient/family education;Orthotic Fit/Training;Manual techniques;Manual lymph drainage;Taping    PT Next Visit Plan  drainage to RLE next session? See if pt made appt to get measured for compression garments at [place]; review manual lymph drainage prn and cont this.     Consulted and Agree with Plan of Care  Patient       Patient will benefit from skilled therapeutic intervention in order to improve the following deficits and impairments:  Increased edema, Decreased knowledge of precautions, Decreased knowledge of use of DME, Abnormal gait, Pain  Visit Diagnosis: Localized edema  Difficulty in walking, not elsewhere classified     Problem List Patient Active Problem List   Diagnosis Date Noted  . LVH (left ventricular hypertrophy), mild 05/18/2017  . Acute intractable headache 05/07/2017  . Ulcer of right lower extremity ([place]) 11/29/2016  . Arthritis of knee 10/30/2016  . S/P knee surgery 10/30/2016  . Burning pain 10/15/2016  . Low back pain 08/01/2016  . Chronic low back pain 07/18/2016  . Prediabetes 05/30/2016  . Cervicalgia 05/30/2016  . Right knee pain 05/30/2016  . Osteopenia 11/18/2015  . Thrombocytopenia ([place]) 05/30/2015  . Arthritis of left lower extremity 02/02/2015  . Left knee pain  09/08/2014  . Gout 12/11/2013  . Obesity with serious comorbidity 11/06/2013  . Osteoporosis   . Left lumbar radiculopathy 04/13/2012  . Renal cell cancer ([place])   . External hemorrhoid 11/14/2010  . IBS (irritable bowel syndrome) 11/14/2010  . GERD (gastroesophageal reflux disease) 11/14/2010  . HYPERCHOLESTEROLEMIA, MILD 03/16/2010  . Lymphedema 03/15/2010  . ALLERGIC RHINITIS 08/31/2009  . MIGRAINE HEADACHE 06/14/2009  . SINUSITIS, CHRONIC 06/14/2009  . SYNCOPE 05/16/2009  . Essential hypertension 05/06/2009  . CONSTIPATION, CHRONIC 05/06/2009    Allyson Sabal [facility] 06/07/2017, 10:42 AM  [place], [place], [address] Phone: [phone]  Fax:  [phone]  Name: Janet Le MRN: [id number] Date of Birth: 13-Oct-1955  Manus Gunning, PT 06/07/17 10:42 AM

## 2017-06-10 ENCOUNTER — Encounter: Payer: Self-pay | Admitting: Physical Therapy

## 2017-06-10 ENCOUNTER — Ambulatory Visit: Payer: BLUE CROSS/BLUE SHIELD | Admitting: Physical Therapy

## 2017-06-10 DIAGNOSIS — M25561 Pain in right knee: Secondary | ICD-10-CM | POA: Diagnosis not present

## 2017-06-10 DIAGNOSIS — M25661 Stiffness of right knee, not elsewhere classified: Secondary | ICD-10-CM | POA: Diagnosis not present

## 2017-06-10 DIAGNOSIS — R600 Localized edema: Secondary | ICD-10-CM

## 2017-06-10 DIAGNOSIS — R262 Difficulty in walking, not elsewhere classified: Secondary | ICD-10-CM | POA: Diagnosis not present

## 2017-06-10 DIAGNOSIS — Z96651 Presence of right artificial knee joint: Secondary | ICD-10-CM | POA: Diagnosis not present

## 2017-06-10 NOTE — Therapy (Signed)
[place], [place], [address] Phone: [phone]   Fax:  [phone]  Physical Therapy Treatment  Patient Details  Name: Janet Le MRN: [id number] Date of Birth: May 22, 1956 Referring Provider: Dr. Billey Gosling   Encounter Date: 06/10/2017  PT End of Session - 06/10/17 1433    Visit Number  6    Number of Visits  7    Date for PT Re-Evaluation  06/24/17    PT Start Time  1350    PT Stop Time  1430    PT Time Calculation (min)  40 min    Activity Tolerance  Patient tolerated treatment well    Behavior During Therapy  [facility] for tasks assessed/performed       Past Medical History:  Diagnosis Date  . Allergic rhinitis, cause unspecified   . Anxiety   . Blood dyscrasia    platelets low in past  . Chronic back pain   . Chronic constipation   . Chronic sinusitis   . Depression   . Endometriosis   . ENDOMETRIOSIS 05/06/2009   Qualifier: Diagnosis of  By: Varney Daily RN, Butch Penny    . Esophageal stricture   . GERD (gastroesophageal reflux disease)   . Hemorrhoids   . Hiatal hernia   . Hyperlipidemia   . Hypertension   . Irritable bowel syndrome   . Osteoarthritis   . Personal history of diabetes mellitus    controlled with diet only  . Renal cell cancer ([place]) 11/2010 dx   R, s/p cryoablation 02/16/11  . Syncopal episodes    since childhood    Past Surgical History:  Procedure Laterality Date  . BREAST CYST EXCISION Bilateral    No visable scar   . BREAST SURGERY     bilateral, cyst removal  . fallopian tube removed    . FUNCTIONAL ENDOSCOPIC SINUS SURGERY    . LASER ABLATION OF THE CERVIX    . 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 TOTAL KNEE ARTHROPLASTY Right 10/30/2016   Procedure: RIGHT TOTAL KNEE ARTHROPLASTY;  Surgeon: Meredith Pel, MD;  Location: [place];  Service: Orthopedics;  Laterality: Right;  . TUBAL LIGATION      There were no vitals  filed for this visit.  Subjective Assessment - 06/10/17 1354    Subjective  My leg felt good after last session.    Pertinent History  Has had swelling in both legs, left is larger than the right.  Pt. fell in 1997 and hurt her right ankle, so was going to the doctor for that and was diagnosed with lymphedema; did therapy at that time in [place] for a while. Had therapy elsewhere in [place] after moving back here, but it seems to be getting worse here.  Pt. reports that LE swelling is familial. Had an echocardiogram last week and doesn't have the results yet. Had right TKA this past May and uses a cane because of jointpain all over; doesn't use it in the house.  Possibly pre-diabetic. and has this running in the family.  HTN controlled with meds; back pain; arthritis.  May need left TKA.  Renal cell carcinoma 2013; pt. says she has cryotherapy for that and was then followed for five years.    Patient Stated Goals  get back with doing the massages correctly    Currently in Pain?  No/denies    Pain Score  0-No pain  [facility] Adult PT Treatment/Exercise - 06/10/17 0001      Manual Therapy   Manual Lymphatic Drainage (MLD)  In Supine: Short neck, 5 diaphragmatic breaths, Rt axillary nodes, Rt inguino-axillary anastomosis, and Rt LE from lateral thigh to dorsal foot working from proximal to distal then retracing all steps.                PT Short Term Goals - 04/04/17 1151      PT SHORT TERM GOAL #1   Title  She will be independent with inital HEP    Time  4    Period  Weeks    Status  Achieved      PT SHORT TERM GOAL #2   Time  4    Period  Weeks    Status  Achieved      PT SHORT TERM GOAL #3   Title  she will be walking in home without device    Time  4    Period  Weeks    Status  Achieved      PT SHORT TERM GOAL #4   Title  She will report decr pain 50% with activity on feet.     Time  4    Period  Weeks    Status  Achieved         PT Long Term Goals - 04/04/17 1154      PT LONG TERM GOAL #1   Title  She will be independent with all HEP issued    Time  12    Status  Achieved      PT LONG TERM GOAL #2   Title  She will report pain as intermitant     Time  12    Period  Weeks    Status  Achieved      PT LONG TERM GOAL #3   Title  She will return to normal home tasks with 1-2 max pain    Time  12    Period  Weeks    Status  Achieved      PT LONG TERM GOAL #4   Title  She will walk without device in and out of home    Baseline  pt able to amb without SPC and reports she using SPC     Time  12    Period  Weeks    Status  Partially Met      PT LONG TERM GOAL #5   Title  She will improve hip abduction strength to 4/5 or better to imptrove stability on feet.     Time  12    Period  Weeks    Status  Achieved      PT LONG TERM GOAL #6   Title  FOTO score improved to < 40% limited    Baseline  54% limited  01/08/17    Time  12    Period  Weeks    Status  Achieved        Long Term Clinic Goals - 05/20/17 1720      CC Long Term Goal  #1   Title  Pt. will be independent in correctly performing self-manual lymph drainage to legs.    Time  4    Period  Weeks    Status  New      CC Long Term Goal  #2   Title  Pt. will be knowledgeable about lymphedema pump options, where and how to obtain.  Time  4    Period  Weeks    Status  New      CC Long Term Goal  #3   Title  Pt. will be knowledgeable about nighttime compression garment options, where and how to obtain.    Time  4    Period  Weeks    Status  New      CC Long Term Goal  #4   Title  Pt. will be knowledgeable about daytime compression garment options, including flat knit off the shelf or custom that may not roll up and be uncomfortable for her.    Time  4    Period  Weeks    Status  New         Plan - 06/10/17 1434    Clinical Impression Statement  Began MLD to RLE today. Gave pt her Rx to take to [facility] and be  measured for compression garments. Pt also mentioned that over the last year she has a tremor in her hand that has worsened and she has been dropping things recently. Educated pt to follow up about this with her neurologist. Pt states she will call to make an appoinmtnet. Will educated pt about compression pump next session.     Rehab Potential  Good    PT Frequency  2x / week    PT Duration  2 weeks    PT Treatment/Interventions  ADLs/Self Care Home Management;DME Instruction;Gait training;Therapeutic exercise;Patient/family education;Orthotic Fit/Training;Manual techniques;Manual lymph drainage;Taping    PT Next Visit Plan  drainage to LLE next session and alternate every other session between L and R, discuss compression pumps (flexi?) See if pt made appt to get measured for compression garments at [facility]; review manual lymph drainage prn and cont this.     Consulted and Agree with Plan of Care  Patient       Patient will benefit from skilled therapeutic intervention in order to improve the following deficits and impairments:  Increased edema, Decreased knowledge of precautions, Decreased knowledge of use of DME, Abnormal gait, Pain  Visit Diagnosis: Localized edema  Difficulty in walking, not elsewhere classified     Problem List Patient Active Problem List   Diagnosis Date Noted  . LVH (left ventricular hypertrophy), mild 05/18/2017  . Acute intractable headache 05/07/2017  . Ulcer of right lower extremity ([place]) 11/29/2016  . Arthritis of knee 10/30/2016  . S/P knee surgery 10/30/2016  . Burning pain 10/15/2016  . Low back pain 08/01/2016  . Chronic low back pain 07/18/2016  . Prediabetes 05/30/2016  . Cervicalgia 05/30/2016  . Right knee pain 05/30/2016  . Osteopenia 11/18/2015  . Thrombocytopenia ([place]) 05/30/2015  . Arthritis of left lower extremity 02/02/2015  . Left knee pain 09/08/2014  . Gout 12/11/2013  . Obesity with serious comorbidity 11/06/2013  .  Osteoporosis   . Left lumbar radiculopathy 04/13/2012  . Renal cell cancer ([place])   . External hemorrhoid 11/14/2010  . IBS (irritable bowel syndrome) 11/14/2010  . GERD (gastroesophageal reflux disease) 11/14/2010  . HYPERCHOLESTEROLEMIA, MILD 03/16/2010  . Lymphedema 03/15/2010  . ALLERGIC RHINITIS 08/31/2009  . MIGRAINE HEADACHE 06/14/2009  . SINUSITIS, CHRONIC 06/14/2009  . SYNCOPE 05/16/2009  . Essential hypertension 05/06/2009  . CONSTIPATION, CHRONIC 05/06/2009    Allyson Sabal [facility] 06/10/2017, 2:37 PM  [place], [place], [address] Phone: [phone]   Fax:  [phone]  Name: EVALYNE CORTOPASSI MRN: [id number] Date of  Birth: 1955/10/27  Allyson Sabal [place], PT 06/10/17 2:37 PM

## 2017-06-10 NOTE — Telephone Encounter (Signed)
Patient called back regarding a prescription for Diabetic shoes. Thanks

## 2017-06-10 NOTE — Telephone Encounter (Signed)
Spoke with Pt, advsd her she should discuss with PCP or Endo about diabetic shoes. Pt states is more for her neuropathy

## 2017-06-11 NOTE — Telephone Encounter (Signed)
We can provide a prescription.

## 2017-06-12 MED ORDER — AMBULATORY NON FORMULARY MEDICATION: 1.0000 [IU] | Freq: Every day | 0 refills | Status: DC

## 2017-06-12 NOTE — Telephone Encounter (Signed)
Called Pt to inquire where I should fax the order for her Diabetic shoes she requested. Pt asked that I mail it to her.

## 2017-06-13 ENCOUNTER — Ambulatory Visit: Payer: BLUE CROSS/BLUE SHIELD

## 2017-06-13 DIAGNOSIS — R600 Localized edema: Secondary | ICD-10-CM | POA: Diagnosis not present

## 2017-06-13 DIAGNOSIS — R262 Difficulty in walking, not elsewhere classified: Secondary | ICD-10-CM | POA: Diagnosis not present

## 2017-06-13 DIAGNOSIS — M25561 Pain in right knee: Secondary | ICD-10-CM | POA: Diagnosis not present

## 2017-06-13 DIAGNOSIS — M25661 Stiffness of right knee, not elsewhere classified: Secondary | ICD-10-CM | POA: Diagnosis not present

## 2017-06-13 DIAGNOSIS — Z96651 Presence of right artificial knee joint: Secondary | ICD-10-CM | POA: Diagnosis not present

## 2017-06-13 NOTE — Therapy (Signed)
[place], [place], [address] Phone: [phone]   Fax:  [phone]  Physical Therapy Treatment  Patient Details  Name: Janet Le MRN: [id number] Date of Birth: 1955/09/22 Referring Provider: Dr. Billey Gosling   Encounter Date: 06/13/2017  PT End of Session - 06/13/17 1203    Visit Number  7    Number of Visits  7    Date for PT Re-Evaluation  06/24/17    PT Start Time  1109    PT Stop Time  1158    PT Time Calculation (min)  49 min    Activity Tolerance  Patient tolerated treatment well    Behavior During Therapy  [facility] for tasks assessed/performed       Past Medical History:  Diagnosis Date  . Allergic rhinitis, cause unspecified   . Anxiety   . Blood dyscrasia    platelets low in past  . Chronic back pain   . Chronic constipation   . Chronic sinusitis   . Depression   . Endometriosis   . ENDOMETRIOSIS 05/06/2009   Qualifier: Diagnosis of  By: Varney Daily RN, Butch Penny    . Esophageal stricture   . GERD (gastroesophageal reflux disease)   . Hemorrhoids   . Hiatal hernia   . Hyperlipidemia   . Hypertension   . Irritable bowel syndrome   . Osteoarthritis   . Personal history of diabetes mellitus    controlled with diet only  . Renal cell cancer ([place]) 11/2010 dx   R, s/p cryoablation 02/16/11  . Syncopal episodes    since childhood    Past Surgical History:  Procedure Laterality Date  . BREAST CYST EXCISION Bilateral    No visable scar   . BREAST SURGERY     bilateral, cyst removal  . fallopian tube removed    . FUNCTIONAL ENDOSCOPIC SINUS SURGERY    . LASER ABLATION OF THE CERVIX    . 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 TOTAL KNEE ARTHROPLASTY Right 10/30/2016   Procedure: RIGHT TOTAL KNEE ARTHROPLASTY;  Surgeon: Meredith Pel, MD;  Location: [place];  Service: Orthopedics;  Laterality: Right;  . TUBAL LIGATION      There were no vitals  filed for this visit.  Subjective Assessment - 06/13/17 1115    Subjective  I had my compression pump demonstration yesterday and I liked it. I think it will help.     Pertinent History  Has had swelling in both legs, left is larger than the right.  Pt. fell in 1997 and hurt her right ankle, so was going to the doctor for that and was diagnosed with lymphedema; did therapy at that time in [place] for a while. Had therapy elsewhere in [place] after moving back here, but it seems to be getting worse here.  Pt. reports that LE swelling is familial. Had an echocardiogram last week and doesn't have the results yet. Had right TKA this past May and uses a cane because of jointpain all over; doesn't use it in the house.  Possibly pre-diabetic. and has this running in the family.  HTN controlled with meds; back pain; arthritis.  May need left TKA.  Renal cell carcinoma 2013; pt. says she has cryotherapy for that and was then followed for five years.    Patient Stated Goals  get back with doing the massages correctly    Currently in Pain?  No/denies  LYMPHEDEMA/ONCOLOGY QUESTIONNAIRE - 06/13/17 1117      Right Lower Extremity Lymphedema   20 cm Proximal to Suprapatella  79.6 cm    10 cm Proximal to Suprapatella  66.4 cm    At Midpatella/Popliteal Crease  45.8 cm    30 cm Proximal to Floor at Lateral Plantar Foot  42.9 cm    20 cm Proximal to Floor at Lateral Plantar Foot  33._0 cm Proximal to Floor at Lateral Malleoli  27.1 cm    5 cm Proximal to 1st MTP Joint  24.8 cm    Across MTP Joint  23.9 cm    Around Proximal Great Toe  8.8 cm      Left Lower Extremity Lymphedema   20 cm Proximal to Suprapatella  83.2 cm    10 cm Proximal to Suprapatella  67.7 cm    At Midpatella/Popliteal Crease  48.6 cm    30 cm Proximal to Floor at Lateral Plantar Foot  42.7 cm    20 cm Proximal to Floor at Lateral Plantar Foot  32.8 cm    10 cm Proximal to Floor at Lateral Malleoli  27.9 cm     5 cm Proximal to 1st MTP Joint  25.7 cm    Across MTP Joint  24.7 cm    Around Proximal Great Toe  9 cm               [facility] Adult PT Treatment/Exercise - 06/13/17 0001      Manual Therapy   Manual Lymphatic Drainage (MLD)  In Supine: Short neck, 5 diaphragmatic breaths, Lt axillary nodes, Lt inguino-axillary anastomosis, and Lt LE from lateral thigh to dorsal foot working from proximal to distal then retracing all steps.                PT Short Term Goals - 04/04/17 1151      PT SHORT TERM GOAL #1   Title  She will be independent with inital HEP    Time  4    Period  Weeks    Status  Achieved      PT SHORT TERM GOAL #2   Time  4    Period  Weeks    Status  Achieved      PT SHORT TERM GOAL #3   Title  she will be walking in home without device    Time  4    Period  Weeks    Status  Achieved      PT SHORT TERM GOAL #4   Title  She will report decr pain 50% with activity on feet.     Time  4    Period  Weeks    Status  Achieved        PT Long Term Goals - 04/04/17 1154      PT LONG TERM GOAL #1   Title  She will be independent with all HEP issued    Time  12    Status  Achieved      PT LONG TERM GOAL #2   Title  She will report pain as intermitant     Time  12    Period  Weeks    Status  Achieved      PT LONG TERM GOAL #3   Title  She will return to normal home tasks with 1-2 max pain    Time  12    Period  Weeks  Status  Achieved      PT LONG TERM GOAL #4   Title  She will walk without device in and out of home    Baseline  pt able to amb without SPC and reports she using SPC     Time  12    Period  Weeks    Status  Partially Met      PT LONG TERM GOAL #5   Title  She will improve hip abduction strength to 4/5 or better to imptrove stability on feet.     Time  12    Period  Weeks    Status  Achieved      PT LONG TERM GOAL #6   Title  FOTO score improved to < 40% limited    Baseline  54% limited  01/08/17    Time  12    Period   Weeks    Status  Achieved        Long Term Clinic Goals - 06/13/17 1214      CC Long Term Goal  #1   Title  Pt. will be independent in correctly performing self-manual lymph drainage to legs.    Baseline  Pt does this some and her daughter is able to help a few times a week-06/13/17    Status  Achieved      CC Long Term Goal  #2   Title  Pt. will be knowledgeable about lymphedema pump options, where and how to obtain.    Status  Achieved      CC Long Term Goal  #3   Title  Pt. will be knowledgeable about nighttime compression garment options, where and how to obtain.    Status  On-going      CC Long Term Goal  #4   Title  Pt. will be knowledgeable about daytime compression garment options, including flat knit off the shelf or custom that may not roll up and be uncomfortable for her.    Baseline  Pt will be going to [facility] once she lines up transportation-06/13/17    Status  Achieved         Plan - 06/13/17 1205    Clinical Impression Statement  Assessed pts circumference measuremnts and these were reduced at both LE's except Rt uppermost thigh was increased. Pt has been compliant with wearing her old garments (needs to line up transportation before she can get measured for her new ones and she is working on this). Also pt has had her compression pump demonstration so that is underway as well. Pt and daughter have been doing her self MLd at home which also seems to be helping her circumferential reductions. Overall pt is making good progress towards being independent in the Maintenance Phase of treatment and may be ready for D/C next week.     Rehab Potential  Good    PT Frequency  2x / week    PT Duration  2 weeks    PT Treatment/Interventions  ADLs/Self Care Home Management;DME Instruction;Gait training;Therapeutic exercise;Patient/family education;Orthotic Fit/Training;Manual techniques;Manual lymph drainage;Taping    PT Next Visit Plan  Possible D/C next week as pt  overall has made great progress since evaluation. If not D/C then pt will need a renewal at next visit. Show and discuss possibility of nighttime garments.    Consulted and Agree with Plan of Care  Patient       Patient will benefit from skilled therapeutic intervention in order to improve the following  deficits and impairments:  Increased edema, Decreased knowledge of precautions, Decreased knowledge of use of DME, Abnormal gait, Pain  Visit Diagnosis: Localized edema  Difficulty in walking, not elsewhere classified     Problem List Patient Active Problem List   Diagnosis Date Noted  . LVH (left ventricular hypertrophy), mild 05/18/2017  . Acute intractable headache 05/07/2017  . Ulcer of right lower extremity ([place]) 11/29/2016  . Arthritis of knee 10/30/2016  . S/P knee surgery 10/30/2016  . Burning pain 10/15/2016  . Low back pain 08/01/2016  . Chronic low back pain 07/18/2016  . Prediabetes 05/30/2016  . Cervicalgia 05/30/2016  . Right knee pain 05/30/2016  . Osteopenia 11/18/2015  . Thrombocytopenia ([place]) 05/30/2015  . Arthritis of left lower extremity 02/02/2015  . Left knee pain 09/08/2014  . Gout 12/11/2013  . Obesity with serious comorbidity 11/06/2013  . Osteoporosis   . Left lumbar radiculopathy 04/13/2012  . Renal cell cancer ([place])   . External hemorrhoid 11/14/2010  . IBS (irritable bowel syndrome) 11/14/2010  . GERD (gastroesophageal reflux disease) 11/14/2010  . HYPERCHOLESTEROLEMIA, MILD 03/16/2010  . Lymphedema 03/15/2010  . ALLERGIC RHINITIS 08/31/2009  . MIGRAINE HEADACHE 06/14/2009  . SINUSITIS, CHRONIC 06/14/2009  . SYNCOPE 05/16/2009  . Essential hypertension 05/06/2009  . CONSTIPATION, CHRONIC 05/06/2009    Otelia Limes, PTA 06/13/2017, 12:17 PM  [place], [place], [address] Phone: [phone]   Fax:  [phone]  Name: VINITA PRENTISS MRN:  [id number] Date of Birth: 01/23/1956

## 2017-06-19 ENCOUNTER — Ambulatory Visit: Payer: BLUE CROSS/BLUE SHIELD | Admitting: Physical Therapy

## 2017-06-19 ENCOUNTER — Encounter: Payer: Self-pay | Admitting: Physical Therapy

## 2017-06-19 DIAGNOSIS — M25561 Pain in right knee: Secondary | ICD-10-CM | POA: Diagnosis not present

## 2017-06-19 DIAGNOSIS — R262 Difficulty in walking, not elsewhere classified: Secondary | ICD-10-CM | POA: Diagnosis not present

## 2017-06-19 DIAGNOSIS — R600 Localized edema: Secondary | ICD-10-CM | POA: Diagnosis not present

## 2017-06-19 DIAGNOSIS — Z96651 Presence of right artificial knee joint: Secondary | ICD-10-CM

## 2017-06-19 DIAGNOSIS — M25661 Stiffness of right knee, not elsewhere classified: Secondary | ICD-10-CM | POA: Diagnosis not present

## 2017-06-19 NOTE — Therapy (Signed)
[place], [place], [address] Phone: [phone]   Fax:  [phone]  Physical Therapy Treatment  Patient Details  Name: Janet Le MRN: [id number] Date of Birth: 06-Mar-1956 Referring Provider: Dr. Billey Gosling   Encounter Date: 06/19/2017  PT End of Session - 06/19/17 1229    Visit Number  8    Number of Visits  7    Date for PT Re-Evaluation  06/24/17    PT Start Time  3845    PT Stop Time  1100    PT Time Calculation (min)  45 min    Activity Tolerance  Patient tolerated treatment well    Behavior During Therapy  [facility] for tasks assessed/performed       Past Medical History:  Diagnosis Date  . Allergic rhinitis, cause unspecified   . Anxiety   . Blood dyscrasia    platelets low in past  . Chronic back pain   . Chronic constipation   . Chronic sinusitis   . Depression   . Endometriosis   . ENDOMETRIOSIS 05/06/2009   Qualifier: Diagnosis of  By: Varney Daily RN, Butch Penny    . Esophageal stricture   . GERD (gastroesophageal reflux disease)   . Hemorrhoids   . Hiatal hernia   . Hyperlipidemia   . Hypertension   . Irritable bowel syndrome   . Osteoarthritis   . Personal history of diabetes mellitus    controlled with diet only  . Renal cell cancer ([place]) 11/2010 dx   R, s/p cryoablation 02/16/11  . Syncopal episodes    since childhood    Past Surgical History:  Procedure Laterality Date  . BREAST CYST EXCISION Bilateral    No visable scar   . BREAST SURGERY     bilateral, cyst removal  . fallopian tube removed    . FUNCTIONAL ENDOSCOPIC SINUS SURGERY    . LASER ABLATION OF THE CERVIX    . 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 TOTAL KNEE ARTHROPLASTY Right 10/30/2016   Procedure: RIGHT TOTAL KNEE ARTHROPLASTY;  Surgeon: Meredith Pel, MD;  Location: [place];  Service: Orthopedics;  Laterality: Right;  . TUBAL LIGATION      There were no vitals  filed for this visit.  Subjective Assessment - 06/19/17 1028    Subjective  Pt said that she has her knee high compression stockings are ordered.  She also ordered thigh high night garments. Her compression pump has been ordered.   She is concerned that she has lymphedema in her arms but will wait to see how she does with LE treatment prior to addressing compression for arms.     Pertinent History  Has had swelling in both legs, left is larger than the right.  Pt. fell in 1997 and hurt her right ankle, so was going to the doctor for that and was diagnosed with lymphedema; did therapy at that time in [place] for a while. Had therapy elsewhere in [place] after moving back here, but it seems to be getting worse here.  Pt. reports that LE swelling is familial. Had an echocardiogram last week and doesn't have the results yet. Had right TKA this past May and uses a cane because of jointpain all over; doesn't use it in the house.  Possibly pre-diabetic. and has this running in the family.  HTN controlled with meds; back pain; arthritis.  May need left TKA.  Renal cell carcinoma  2013; pt. says she has cryotherapy for that and was then followed for five years.    Patient Stated Goals  get back with doing the massages correctly    Currently in Pain?  No/denies                      [facility] Adult PT Treatment/Exercise - 06/19/17 0001      Manual Therapy   Manual Lymphatic Drainage (MLD)  In Supine: Short neck, 5 diaphragmatic breaths, Lt axillary nodes, Lt inguino-axillary anastomosis, and Lt LE from lateral thigh to dorsal foot working from proximal to distal then retracing all steps.  then same to Rt side                PT Short Term Goals - 04/04/17 1151      PT SHORT TERM GOAL #1   Title  She will be independent with inital HEP    Time  4    Period  Weeks    Status  Achieved      PT SHORT TERM GOAL #2   Time  4    Period  Weeks    Status  Achieved      PT SHORT TERM GOAL  #3   Title  she will be walking in home without device    Time  4    Period  Weeks    Status  Achieved      PT SHORT TERM GOAL #4   Title  She will report decr pain 50% with activity on feet.     Time  4    Period  Weeks    Status  Achieved        PT Long Term Goals - 04/04/17 1154      PT LONG TERM GOAL #1   Title  She will be independent with all HEP issued    Time  12    Status  Achieved      PT LONG TERM GOAL #2   Title  She will report pain as intermitant     Time  12    Period  Weeks    Status  Achieved      PT LONG TERM GOAL #3   Title  She will return to normal home tasks with 1-2 max pain    Time  12    Period  Weeks    Status  Achieved      PT LONG TERM GOAL #4   Title  She will walk without device in and out of home    Baseline  pt able to amb without SPC and reports she using SPC     Time  12    Period  Weeks    Status  Partially Met      PT LONG TERM GOAL #5   Title  She will improve hip abduction strength to 4/5 or better to imptrove stability on feet.     Time  12    Period  Weeks    Status  Achieved      PT LONG TERM GOAL #6   Title  FOTO score improved to < 40% limited    Baseline  54% limited  01/08/17    Time  12    Period  Weeks    Status  Achieved        Long Term Clinic Goals - 06/19/17 1036      CC Long Term Goal  #1  Title  Pt. will be independent in correctly performing self-manual lymph drainage to legs.    Baseline  Pt does this some and her daughter is able to help a few times a week-06/13/17    Status  Achieved      CC Long Term Goal  #2   Title  Pt. will be knowledgeable about lymphedema pump options, where and how to obtain.    Status  Achieved      CC Long Term Goal  #3   Title  Pt. will be knowledgeable about nighttime compression garment options, where and how to obtain.    Status  Achieved      CC Long Term Goal  #4   Title  Pt. will be knowledgeable about daytime compression garment options, including flat  knit off the shelf or custom that may not roll up and be uncomfortable for her.    Baseline  Pt will be going to [facility] once she lines up transportation-06/13/17    Status  Achieved         Plan - 06/19/17 1229    Clinical Impression Statement  Pt circumferences were measured last session and showed reductions in all measurements except for uppermost thighs.  She has compression garments and compression pump on order and is able to do self manual lymph drainage with family assist.  She was encouraged to increase exercise and activity She feels that she is ready to discharge from this episode.  She may want to address UE lymphedema at some point in her future, but question if her cardiac and renal system with be able to manage compression on all 4 extremities.  She will try to manage the LE first and see how that goes  She will discharge from this episode.     PT Next Visit Plan  Discharge        Patient will benefit from skilled therapeutic intervention in order to improve the following deficits and impairments:  Increased edema, Decreased knowledge of precautions, Decreased knowledge of use of DME, Abnormal gait, Pain  Visit Diagnosis: Localized edema  Difficulty in walking, not elsewhere classified  History of total knee arthroplasty, right     Problem List Patient Active Problem List   Diagnosis Date Noted  . LVH (left ventricular hypertrophy), mild 05/18/2017  . Acute intractable headache 05/07/2017  . Ulcer of right lower extremity ([place]) 11/29/2016  . Arthritis of knee 10/30/2016  . S/P knee surgery 10/30/2016  . Burning pain 10/15/2016  . Low back pain 08/01/2016  . Chronic low back pain 07/18/2016  . Prediabetes 05/30/2016  . Cervicalgia 05/30/2016  . Right knee pain 05/30/2016  . Osteopenia 11/18/2015  . Thrombocytopenia ([place]) 05/30/2015  . Arthritis of left lower extremity 02/02/2015  . Left knee pain 09/08/2014  . Gout 12/11/2013  . Obesity with serious  comorbidity 11/06/2013  . Osteoporosis   . Left lumbar radiculopathy 04/13/2012  . Renal cell cancer ([place])   . External hemorrhoid 11/14/2010  . IBS (irritable bowel syndrome) 11/14/2010  . GERD (gastroesophageal reflux disease) 11/14/2010  . HYPERCHOLESTEROLEMIA, MILD 03/16/2010  . Lymphedema 03/15/2010  . ALLERGIC RHINITIS 08/31/2009  . MIGRAINE HEADACHE 06/14/2009  . SINUSITIS, CHRONIC 06/14/2009  . SYNCOPE 05/16/2009  . Essential hypertension 05/06/2009  . CONSTIPATION, CHRONIC 05/06/2009   PHYSICAL THERAPY DISCHARGE SUMMARY  Visits from Start of Care: 8  Current functional level related to goals / functional outcomes: As above    Remaining deficits: As above  Education / Equipment: Self manual lymph drainage, how to get compression garments and pump , remedial exrcise for lymphedema  Plan: Patient agrees to discharge.  Patient goals were met. Patient is being discharged due to meeting the stated rehab goals.  ?????    Donato Heinz. Owens Shark PT  Norwood Levo 06/19/2017, 12:35 PM  [place], [place], [address] Phone: [phone]   Fax:  [phone]  Name: Janet Le MRN: [id number] Date of Birth: [birth year]

## 2017-06-20 ENCOUNTER — Telehealth: Payer: Self-pay | Admitting: Internal Medicine

## 2017-06-20 NOTE — Telephone Encounter (Signed)
Copied from [place]. Topic: General - Other >> Jun 20, 2017 10:42 AM Neva Seat wrote: Pt needs Belvic 10mg  refilled at  [facility] - [phone].  Pt says to call her if it cannot be called in and or if she needs a visit with Dr. Quay Burow before refill.

## 2017-06-20 NOTE — Telephone Encounter (Signed)
Pt called back and states she is doing fine since beginning the medication.

## 2017-06-20 NOTE — Telephone Encounter (Signed)
She needs to set up a follow-up next month.  Medication was only approved through mid February if she has not lost enough weight her insurance company will not approve it further.

## 2017-06-20 NOTE — Telephone Encounter (Signed)
Left message for patient to call back and let provider know how she is doing on medication- follow up was supposed to be 1 month. Would like to know her response to medication. Will forward request to provider.

## 2017-06-20 NOTE — Telephone Encounter (Signed)
LVM to inform the patient below.  Patient will need a FU appointment for medications with her PCP.

## 2017-06-21 ENCOUNTER — Ambulatory Visit: Payer: BLUE CROSS/BLUE SHIELD | Admitting: Physical Therapy

## 2017-06-24 ENCOUNTER — Ambulatory Visit: Payer: BLUE CROSS/BLUE SHIELD

## 2017-06-26 DIAGNOSIS — H10413 Chronic giant papillary conjunctivitis, bilateral: Secondary | ICD-10-CM | POA: Diagnosis not present

## 2017-06-26 DIAGNOSIS — H04123 Dry eye syndrome of bilateral lacrimal glands: Secondary | ICD-10-CM | POA: Diagnosis not present

## 2017-06-26 DIAGNOSIS — H2513 Age-related nuclear cataract, bilateral: Secondary | ICD-10-CM | POA: Diagnosis not present

## 2017-06-26 DIAGNOSIS — H05243 Constant exophthalmos, bilateral: Secondary | ICD-10-CM | POA: Diagnosis not present

## 2017-07-04 NOTE — Patient Instructions (Addendum)
Medications reviewed and updated.  No changes recommended at this time.  Your prescription(s) have been submitted to your pharmacy. Please take as directed and contact our office if you believe you are having problem(s) with the medication(s).  A referral was ordered for Dr Amalia Hailey.   Please followup in 6 months

## 2017-07-04 NOTE — Progress Notes (Signed)
Subjective:    Patient ID: Janet Le, female    DOB: 1956/05/27, 62 y.o.   MRN: [id number]  HPI The patient is here for follow up.  Obesity:  She was started on Belviq about 2 months ago.  Kirke Shaggy was not covered and she did not tolerate contrave. She denies side effects.  She took it for one month and then ran out.  She did have a decreased appetite.  She is exercising.  She is working on eating healthy.  She did take it for 1 month and during that time she lost weight.  She has not been on it for the past month.  She would like to restart it and get a 65-month prescription.  Hypertension: She is taking her medication daily. She is compliant with a low sodium diet.  She denies chest pain, palpitations, edema, shortness of breath and regular headaches. She is exercising regularly.     Foot neuropathy:  She has intermittent numbness/tingling and pain.  She is seeing Dr Amalia Hailey.  She is taking Lyrica 100 mg twice daily.  She follows with neurology.  She denies active foot lesions.  She has leg swelling.     Medications and allergies reviewed with patient and updated if appropriate.  Patient Active Problem List   Diagnosis Date Noted  . LVH (left ventricular hypertrophy), mild 05/18/2017  . Acute intractable headache 05/07/2017  . Ulcer of right lower extremity ([place]) 11/29/2016  . Arthritis of knee 10/30/2016  . S/P knee surgery 10/30/2016  . Burning pain 10/15/2016  . Low back pain 08/01/2016  . Chronic low back pain 07/18/2016  . Prediabetes 05/30/2016  . Cervicalgia 05/30/2016  . Right knee pain 05/30/2016  . Osteopenia 11/18/2015  . Thrombocytopenia ([place]) 05/30/2015  . Arthritis of left lower extremity 02/02/2015  . Left knee pain 09/08/2014  . Gout 12/11/2013  . Obesity with serious comorbidity 11/06/2013  . Osteoporosis   . Left lumbar radiculopathy 04/13/2012  . Renal cell cancer ([place])   . External hemorrhoid 11/14/2010  . IBS (irritable bowel syndrome) 11/14/2010    . GERD (gastroesophageal reflux disease) 11/14/2010  . HYPERCHOLESTEROLEMIA, MILD 03/16/2010  . Lymphedema 03/15/2010  . ALLERGIC RHINITIS 08/31/2009  . MIGRAINE HEADACHE 06/14/2009  . SINUSITIS, CHRONIC 06/14/2009  . SYNCOPE 05/16/2009  . Essential hypertension 05/06/2009  . CONSTIPATION, CHRONIC 05/06/2009    Current Outpatient Medications on File Prior to Visit  Medication Sig Dispense Refill  . ACCU-CHEK SOFTCLIX LANCETS lancets by Other route. Use twice a day for blood sugar     . alendronate (FOSAMAX) 70 MG tablet TAKE 1 TABLET ONCE A WEEK WITH A FULL GLASS OF WATER ON AN EMPTY STOMACH 12 tablet 1  . AMBULATORY NON FORMULARY MEDICATION 1 Units by Other route daily. Diabetic Shoes 1 Units 0  . amLODipine (NORVASC) 5 MG tablet Take 1 tablet (5 mg total) by mouth daily. 30 tablet 0  . atorvastatin (LIPITOR) 10 MG tablet Take 1 tablet (10 mg total) by mouth daily. Short term supply 14 tablet 0  . calcium-vitamin D (OSCAL WITH D) 500-200 MG-UNIT tablet Take 1 tablet by mouth daily.     . Diclofenac Sodium 2 % SOLN Apply 1 pump twice daily. (Patient taking differently: Apply 1 application topically 2 (two) times daily as needed (PAIN). Apply 1 pump twice daily.) 112 g 1  . fluticasone (FLONASE) 50 MCG/ACT nasal spray Place 1 spray into both nostrils daily. 16 g 2  . furosemide (LASIX) 20 MG  tablet TAKE 1 TABLET DAILY 90 tablet 1  . glucose blood (ONETOUCH VERIO) test strip Use one strip to check blood sugar daily 100 each 4  . HYDROmorphone (DILAUDID) 2 MG tablet Take 1 tablet (2 mg total) by mouth every 4 (four) hours as needed for severe pain. 45 tablet 0  . Insulin Pen Needle (NOVOFINE) 32G X 6 MM MISC Use daily with Saxenda 100 each 1  . Linaclotide (LINZESS) 145 MCG CAPS capsule Take 1 capsule (145 mcg total) by mouth daily. (Patient taking differently: Take 145 mcg by mouth daily as needed (CONSTIPATION). ) 90 capsule 1  . Lorcaserin HCl 10 MG TABS Take 10 mg 2 (two) times daily by  mouth. 60 tablet 0  . losartan (COZAAR) 100 MG tablet Take 1 tablet (100 mg total) by mouth daily. 30 tablet 0  . methocarbamol (ROBAXIN) 500 MG tablet Take 1 tablet (500 mg total) by mouth every 6 (six) hours as needed for muscle spasms. 30 tablet 2  . NONFORMULARY OR COMPOUNDED ITEM [facility]  Onychomycosis Nail Lacquer -  Fluconazole 2%, Terbinafine 1% DMSO Apply to affected nail once daily Qty. 120 gm 3 refills 1 each 2  . nystatin (MYCOSTATIN) 100000 UNIT/ML suspension Take 5 mLs by mouth 3 (three) times daily. SWISH AND SWALLOW    . ondansetron (ZOFRAN) 4 MG tablet Take 1-2 tablets (4-8 mg total) by mouth every 8 (eight) hours as needed for nausea or vomiting. 40 tablet 0  . ONETOUCH DELICA LANCETS 90Z MISC 1 each by Does not apply route daily. Use to help check blood sugars once daily Dx 250.00 100 each 3  . pregabalin (LYRICA) 100 MG capsule Take 1 capsule (100 mg total) 2 (two) times daily by mouth. 60 capsule 5  . Prenatal Vit-Fe Fumarate-FA (PNV PRENATAL PLUS MULTIVITAMIN) 27-1 MG TABS TAKE 1 TABLET DAILY AT 12 NOON 60 tablet 3  . propranolol (INDERAL) 80 MG tablet TAKE 1 TABLET TWICE A DAY 180 tablet 1  . terbinafine (LAMISIL) 250 MG tablet     . zolpidem (AMBIEN) 5 MG tablet Take 1 tablet (5 mg total) by mouth at bedtime as needed for sleep. 30 tablet 0   No current facility-administered medications on file prior to visit.     Past Medical History:  Diagnosis Date  . Allergic rhinitis, cause unspecified   . Anxiety   . Blood dyscrasia    platelets low in past  . Chronic back pain   . Chronic constipation   . Chronic sinusitis   . Depression   . Endometriosis   . ENDOMETRIOSIS 05/06/2009   Qualifier: Diagnosis of  By: Varney Daily RN, Butch Penny    . Esophageal stricture   . GERD (gastroesophageal reflux disease)   . Hemorrhoids   . Hiatal hernia   . Hyperlipidemia   . Hypertension   . Irritable bowel syndrome   . Osteoarthritis   . Personal history of diabetes  mellitus    controlled with diet only  . Renal cell cancer ([place]) 11/2010 dx   R, s/p cryoablation 02/16/11  . Syncopal episodes    since childhood    Past Surgical History:  Procedure Laterality Date  . BREAST CYST EXCISION Bilateral    No visable scar   . BREAST SURGERY     bilateral, cyst removal  . fallopian tube removed    . FUNCTIONAL ENDOSCOPIC SINUS SURGERY    . LASER ABLATION OF THE CERVIX    . RENAL CRYOABLATION  02/16/11  R kidney due to RCC (IR procedure)  . TOTAL KNEE ARTHROPLASTY Right 10/30/2016   Procedure: RIGHT TOTAL KNEE ARTHROPLASTY;  Surgeon: Meredith Pel, MD;  Location: [facility];  Service: Orthopedics;  Laterality: Right;  . TUBAL LIGATION      Social History   Socioeconomic History  . Marital status: Married    Spouse name: None  . Number of children: None  . Years of education: None  . Highest education level: None  Social Needs  . Financial resource strain: None  . Food insecurity - worry: None  . Food insecurity - inability: None  . Transportation needs - medical: None  . Transportation needs - non-medical: None  Occupational History  . 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 Smoking status: Never Smoker  . Smokeless tobacco: Never Used  Substance and Sexual Activity  . Alcohol use: No    Comment: rare  . Drug use: No  . Sexual activity: No  Other Topics Concern  . None  Social History Narrative  . None    Family History  Problem Relation Age of Onset  . Diabetes Mother   . Kidney disease Father   . Prostate cancer Father   . Colitis Brother   . Leukemia Brother   . Multiple sclerosis Sister   . Colon polyps Unknown   . Breast cancer Maternal Aunt   . Breast cancer Cousin   . Colon cancer Neg Hx     Review of Systems  Constitutional: Negative for chills and fever.  Respiratory: Negative for cough, shortness of breath and wheezing.   Cardiovascular: Positive for leg  swelling (Chronic). Negative for chest pain and palpitations.  Neurological: Negative for light-headedness and headaches.       Objective:   Vitals:   07/05/17 1133  BP: 114/76  Pulse: 66  Resp: 16  Temp: 97.8 F (36.6 C)  SpO2: 97%   Wt Readings from Last 3 Encounters:  07/05/17 246 lb (111.6 kg)  05/14/17 247 lb (112 kg)  05/07/17 248 lb (112.5 kg)   Body mass index is 46.48 kg/m.   Physical Exam    Constitutional: Appears well-developed and well-nourished. No distress.  HENT:  Head: Normocephalic and atraumatic.  Neck: Neck supple. No tracheal deviation present. No thyromegaly present.  No cervical lymphadenopathy Cardiovascular: Normal rate, regular rhythm and normal heart sounds.   No murmur heard. No carotid bruit .  1+ non pitting ref edema Pulmonary/Chest: Effort normal and breath sounds normal. No respiratory distress. No has no wheezes. No rales.  Skin: Skin is warm and dry. Not diaphoretic.  Psychiatric: Normal mood and affect. Behavior is normal.     Foot Exam - Simple   Simple Foot Form  Foot exam was performed with the following findings:  Yes   Visual Inspection No deformities, no ulcerations, no other skin breakdown bilaterally:  Yes Sensation Testing See comments:  Yes Pulse Check Posterior Tibialis and Dorsalis pulse intact bilaterally:  Yes Comments Decreased sensation to monofilament and light touch bilateral plantar surfaces      Assessment & Plan:    See Problem List for Assessment and Plan of chronic medical problems.

## 2017-07-05 ENCOUNTER — Ambulatory Visit: Payer: BLUE CROSS/BLUE SHIELD | Admitting: Internal Medicine

## 2017-07-05 ENCOUNTER — Encounter: Payer: Self-pay | Admitting: Internal Medicine

## 2017-07-05 VITALS — BP 114/76 | HR 66 | Temp 97.8°F | Resp 16 | Wt 246.0 lb

## 2017-07-05 DIAGNOSIS — I10 Essential (primary) hypertension: Secondary | ICD-10-CM | POA: Diagnosis not present

## 2017-07-05 DIAGNOSIS — Z6841 Body Mass Index (BMI) 40.0 and over, adult: Secondary | ICD-10-CM

## 2017-07-05 DIAGNOSIS — G609 Hereditary and idiopathic neuropathy, unspecified: Secondary | ICD-10-CM | POA: Diagnosis not present

## 2017-07-05 MED ORDER — LORCASERIN HCL 10 MG PO TABS: 10.0000 mg | ORAL_TABLET | Freq: Two times a day (BID) | ORAL | 1 refills | Status: DC

## 2017-07-05 MED ORDER — LORCASERIN HCL 10 MG PO TABS: 10.0000 mg | ORAL_TABLET | Freq: Two times a day (BID) | ORAL | 0 refills | Status: DC

## 2017-07-05 NOTE — Assessment & Plan Note (Signed)
BP well controlled Current regimen effective and well tolerated Continue current medications at current doses  

## 2017-07-05 NOTE — Assessment & Plan Note (Signed)
She took Belviq for 1 month and did not have any side effects and it did seem to help.  She has not been on it for the past month. Will renew her prescription-continue 10 mg twice daily Stressed the importance of maintaining regular exercise and a healthy diet with decrease portions

## 2017-07-05 NOTE — Assessment & Plan Note (Signed)
She states that she does have neuropathy in her feet.  After reviewing her chart she had an EMG on 10/2016 and it showed no evidence of neuropathy She does have decreased sensation in her feet bilaterally She has intermittent pain, numbness/tingling in her feet Will refer to podiatry for their opinion see if she is a candidate for "diabetic" shoes.  She does have diabetes, but her sugars have been never diabetic.

## 2017-07-15 ENCOUNTER — Ambulatory Visit: Payer: BLUE CROSS/BLUE SHIELD | Admitting: Podiatry

## 2017-07-15 ENCOUNTER — Encounter: Payer: Self-pay | Admitting: Podiatry

## 2017-07-15 DIAGNOSIS — B351 Tinea unguium: Secondary | ICD-10-CM | POA: Diagnosis not present

## 2017-07-15 DIAGNOSIS — M79609 Pain in unspecified limb: Secondary | ICD-10-CM | POA: Diagnosis not present

## 2017-07-17 NOTE — Progress Notes (Signed)
   SUBJECTIVE Patient presents to office today complaining of elongated, thickened nails. Pain while ambulating in shoes. Patient is unable to trim their own nails. She reports bilateral lower extremity lymphedema and is requesting DM shoes. She was referred by her PCP for neuropathy. Patient is here for further evaluation and treatment.   Past Medical History:  Diagnosis Date  . Allergic rhinitis, cause unspecified   . Anxiety   . Blood dyscrasia    platelets low in past  . Chronic back pain   . Chronic constipation   . Chronic sinusitis   . Depression   . Endometriosis   . ENDOMETRIOSIS 05/06/2009   Qualifier: Diagnosis of  By: Varney Daily RN, Butch Penny    . Esophageal stricture   . GERD (gastroesophageal reflux disease)   . Hemorrhoids   . Hiatal hernia   . Hyperlipidemia   . Hypertension   . Irritable bowel syndrome   . Osteoarthritis   . Personal history of diabetes mellitus    controlled with diet only  . Renal cell cancer ([place]) 11/2010 dx   R, s/p cryoablation 02/16/11  . Syncopal episodes    since childhood    OBJECTIVE General Patient is awake, alert, and oriented x 3 and in no acute distress. Derm Skin is dry and supple bilateral. Negative open lesions or macerations. Remaining integument unremarkable. Nails are tender, long, thickened and dystrophic with subungual debris, consistent with onychomycosis, 1-5 bilateral. No signs of infection noted. Vasc  DP and PT pedal pulses palpable bilaterally. Temperature gradient within normal limits.  Neuro Epicritic and protective threshold sensation diminished bilaterally.  Musculoskeletal Exam No symptomatic pedal deformities noted bilateral. Muscular strength within normal limits.  ASSESSMENT 1. Onychodystrophic nails 1-5 bilateral with hyperkeratosis of nails.  2. Onychomycosis of nail due to dermatophyte bilateral 3. Lymphedema BLE  PLAN OF CARE 1. Patient evaluated today.  2. Instructed to maintain good pedal hygiene and  foot care.  3. Mechanical debridement of nails 1-5 bilaterally performed using a nail nipper. Filed with dremel without incident.  4. Recommended follow up with company she ordered the lymphedema pumps and compression socks from.  5. Return to clinic as needed.   Edrick Kins, DPM [facility]  Dr. Edrick Kins, [address]                                        [place], [place] [address]                Office [phone]  Fax [phone]

## 2017-07-22 DIAGNOSIS — I890 Lymphedema, not elsewhere classified: Secondary | ICD-10-CM | POA: Diagnosis not present

## 2017-08-01 ENCOUNTER — Ambulatory Visit: Payer: BLUE CROSS/BLUE SHIELD | Admitting: Neurology

## 2017-08-22 ENCOUNTER — Telehealth: Payer: Self-pay

## 2017-08-22 NOTE — Telephone Encounter (Signed)
Copied from [place]. Topic: Quick Communication - See Telephone Encounter >> Aug 21, 2017  3:51 PM Antonieta Iba C wrote: CRM for notification. See Telephone encounter for: pt called in to schedule her 2nd shingles vac. I was advised that they are on back order. Pt would like a call to be scheduled once in stock   08/21/17. >> Aug 22, 2017  7:33 AM Morphies, Isidoro Donning wrote: Do we have her second one? If not, can you add her to the list please?  Patient advised that a second shingrix vaccine was automatically saved for her after she got her first vaccine---patient can come anytime 2-4 months after first injection----patient will call back to schedule nurse visit

## 2017-08-23 ENCOUNTER — Telehealth: Payer: Self-pay | Admitting: Emergency Medicine

## 2017-08-23 NOTE — Telephone Encounter (Addendum)
PA approved for Belviq. Pharmacy notified. Pharmacy states that RX can not be filled until 09/10/17.

## 2017-09-12 ENCOUNTER — Ambulatory Visit: Payer: BLUE CROSS/BLUE SHIELD | Admitting: Neurology

## 2017-09-27 ENCOUNTER — Ambulatory Visit: Payer: BLUE CROSS/BLUE SHIELD | Admitting: Neurology

## 2017-10-03 ENCOUNTER — Other Ambulatory Visit: Payer: Self-pay | Admitting: Internal Medicine

## 2017-10-04 ENCOUNTER — Telehealth: Payer: Self-pay | Admitting: Internal Medicine

## 2017-10-04 ENCOUNTER — Ambulatory Visit (INDEPENDENT_AMBULATORY_CARE_PROVIDER_SITE_OTHER): Payer: BLUE CROSS/BLUE SHIELD

## 2017-10-04 DIAGNOSIS — Z299 Encounter for prophylactic measures, unspecified: Secondary | ICD-10-CM | POA: Diagnosis not present

## 2017-10-04 NOTE — Telephone Encounter (Signed)
I have received [employer] forms the patient to be completed. Patient did not complete her part of the forms. The forms had been completed once before by Dr. Marlou Sa last year. For a temporary time frame for 3-6 months due to a knee surgery. Patient stated she no longer sees Dr.Dean.   I asked patient what is the reason now for needing the forms completed. And she stated " I don't know, because I need to ride the bus, can't you find that in my health history." She just kept stating just fill out the forms, what does it matter.   Dr.Burns I see on 07/05/17 is the LOV and you noted some things about her "foot neuropathy" but referred her to Dr. Amalia Hailey for this. The patient didn't not understand why I couldn't go off the last forms, she also did not understand why she was only given temporary before. I explained it all the best way I could but could not seem to get through to her. I had her make her appointment to come and see you about the forms, at less than that way we have something from this year in her OV notes for why she needs them.   Please advise if this is okay, or if I need to do something different. Thank you.

## 2017-10-05 NOTE — Telephone Encounter (Signed)
That sounds reasonable.  Thanks.

## 2017-10-06 NOTE — Progress Notes (Signed)
Subjective:    Patient ID: Janet Le, female    DOB: 1956-02-06, 62 y.o.   MRN: [id number]  HPI The patient is here for an acute visit.  She needs forms filled out for [employer].    She is disabled due to depression and arthritis.  She does not drive. She takes the [employer] bus to appointments.   She has b/l knee arthritis.  She uses a cane or walker to ambulate long distances.  She has arthritis in her hip, shoulder and neck. All her joints hurt.    Lymphedema:  She continues to have her chronic bilateral lower extremity lymphedema.  She is interested in seeing vascular surgery for this and would like a referral.  She is taking Lasix daily.  She is not able to exercise regularly secondary to her arthritis.  Knee arthritis: She has significant bilateral knee arthritis that limits her ability to ambulate.  She uses a cane or walker to ambulate.  She would like to see a different orthopedic for evaluation of her knee arthritis and does not want to go back to her current orthopedic.  Memory concerns:  She missed her appt with dr Tomi Likens.  She is concerned that her memory is not as good and would like to have this evaluated.  She was on control of who she should see.  She states she is having difficulty remembering things.  Fatigue:  She is not doing much during hte day.  She sometimes sleeps during the day.  She is not exercising.  When cooking - after 15 min pain makes her sit down.   She can not get to sleep until 3-4 am often and then will sleep until 8-9 and is then tired all day.  If she sleeps during the day she will sleep 2 hrs - most days.    Hair loss:  Her mom has hair loss and she has noticed that her hair loss is getting worse.  Her hair loss is only on her head and not diffuse.  She wanted to have this evaluated further.  Mood swings and anxiety:  Increased.  Especially with dealing with more people.  She does have some depression.    Medications and allergies reviewed with patient and  updated if appropriate.  Patient Active Problem List   Diagnosis Date Noted  . Idiopathic peripheral neuropathy 07/05/2017  . LVH (left ventricular hypertrophy), mild 05/18/2017  . Arthritis of knee 10/30/2016  . S/P knee surgery 10/30/2016  . Burning pain 10/15/2016  . Low back pain 08/01/2016  . Chronic low back pain 07/18/2016  . Prediabetes 05/30/2016  . Cervicalgia 05/30/2016  . Right knee pain 05/30/2016  . Osteopenia 11/18/2015  . Thrombocytopenia ([place]) 05/30/2015  . Arthritis of left lower extremity 02/02/2015  . Left knee pain 09/08/2014  . Gout 12/11/2013  . Obesity with serious comorbidity 11/06/2013  . Osteoporosis   . Left lumbar radiculopathy 04/13/2012  . Renal cell cancer ([place])   . External hemorrhoid 11/14/2010  . IBS (irritable bowel syndrome) 11/14/2010  . GERD (gastroesophageal reflux disease) 11/14/2010  . HYPERCHOLESTEROLEMIA, MILD 03/16/2010  . Lymphedema 03/15/2010  . ALLERGIC RHINITIS 08/31/2009  . MIGRAINE HEADACHE 06/14/2009  . SINUSITIS, CHRONIC 06/14/2009  . SYNCOPE 05/16/2009  . Essential hypertension 05/06/2009  . CONSTIPATION, CHRONIC 05/06/2009    Current Outpatient Medications on File Prior to Visit  Medication Sig Dispense Refill  . ACCU-CHEK SOFTCLIX LANCETS lancets by Other route. Use twice a day for blood sugar     .  alendronate (FOSAMAX) 70 MG tablet TAKE 1 TABLET ONCE A WEEK WITH A FULL GLASS OF WATER ON AN EMPTY STOMACH 12 tablet 1  . AMBULATORY NON FORMULARY MEDICATION 1 Units by Other route daily. Diabetic Shoes 1 Units 0  . amLODipine (NORVASC) 5 MG tablet Take 1 tablet (5 mg total) by mouth daily. 30 tablet 0  . atorvastatin (LIPITOR) 10 MG tablet Take 1 tablet (10 mg total) by mouth daily. Short term supply 14 tablet 0  . calcium-vitamin D (OSCAL WITH D) 500-200 MG-UNIT tablet Take 1 tablet by mouth daily.     . Diclofenac Sodium 2 % SOLN Apply 1 pump twice daily. (Patient taking differently: Apply 1 application topically 2  (two) times daily as needed (PAIN). Apply 1 pump twice daily.) 112 g 1  . fluticasone (FLONASE) 50 MCG/ACT nasal spray Place 1 spray into both nostrils daily. 16 g 2  . furosemide (LASIX) 20 MG tablet TAKE 1 TABLET DAILY 90 tablet 1  . glucose blood (ONETOUCH VERIO) test strip Use one strip to check blood sugar daily 100 each 4  . HYDROmorphone (DILAUDID) 2 MG tablet Take 1 tablet (2 mg total) by mouth every 4 (four) hours as needed for severe pain. 45 tablet 0  . Insulin Pen Needle (NOVOFINE) 32G X 6 MM MISC Use daily with Saxenda 100 each 1  . Linaclotide (LINZESS) 145 MCG CAPS capsule Take 1 capsule (145 mcg total) by mouth daily. (Patient taking differently: Take 145 mcg by mouth daily as needed (CONSTIPATION). ) 90 capsule 1  . Lorcaserin HCl 10 MG TABS Take 10 mg by mouth 2 (two) times daily. 28 tablet 0  . losartan (COZAAR) 100 MG tablet Take 1 tablet (100 mg total) by mouth daily. 30 tablet 0  . losartan (COZAAR) 100 MG tablet TAKE 1 TABLET DAILY 90 tablet 1  . methocarbamol (ROBAXIN) 500 MG tablet Take 1 tablet (500 mg total) by mouth every 6 (six) hours as needed for muscle spasms. 30 tablet 2  . NONFORMULARY OR COMPOUNDED ITEM [facility]  Onychomycosis Nail Lacquer -  Fluconazole 2%, Terbinafine 1% DMSO Apply to affected nail once daily Qty. 120 gm 3 refills 1 each 2  . nystatin (MYCOSTATIN) 100000 UNIT/ML suspension Take 5 mLs by mouth 3 (three) times daily. SWISH AND SWALLOW    . ondansetron (ZOFRAN) 4 MG tablet Take 1-2 tablets (4-8 mg total) by mouth every 8 (eight) hours as needed for nausea or vomiting. 40 tablet 0  . ONETOUCH DELICA LANCETS 08X MISC 1 each by Does not apply route daily. Use to help check blood sugars once daily Dx 250.00 100 each 3  . Prenatal Vit-Fe Fumarate-FA (PNV PRENATAL PLUS MULTIVITAMIN) 27-1 MG TABS TAKE 1 TABLET DAILY AT 12 NOON 60 tablet 3  . propranolol (INDERAL) 80 MG tablet TAKE 1 TABLET TWICE A DAY 180 tablet 1  . terbinafine (LAMISIL)  250 MG tablet     . zolpidem (AMBIEN) 5 MG tablet Take 1 tablet (5 mg total) by mouth at bedtime as needed for sleep. 30 tablet 0   No current facility-administered medications on file prior to visit.     Past Medical History:  Diagnosis Date  . Allergic rhinitis, cause unspecified   . Anxiety   . Blood dyscrasia    platelets low in past  . Chronic back pain   . Chronic constipation   . Chronic sinusitis   . Depression   . Endometriosis   . ENDOMETRIOSIS 05/06/2009  Qualifier: Diagnosis of  By: Surface RN, Butch Penny    . Esophageal stricture   . GERD (gastroesophageal reflux disease)   . Hemorrhoids   . Hiatal hernia   . Hyperlipidemia   . Hypertension   . Irritable bowel syndrome   . Osteoarthritis   . Personal history of diabetes mellitus    controlled with diet only  . Renal cell cancer ([place]) 11/2010 dx   R, s/p cryoablation 02/16/11  . Syncopal episodes    since childhood    Past Surgical History:  Procedure Laterality Date  . BREAST CYST EXCISION Bilateral    No visable scar   . BREAST SURGERY     bilateral, cyst removal  . fallopian tube removed    . FUNCTIONAL ENDOSCOPIC SINUS SURGERY    . LASER ABLATION OF THE CERVIX    . 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 TOTAL KNEE ARTHROPLASTY Right 10/30/2016   Procedure: RIGHT TOTAL KNEE ARTHROPLASTY;  Surgeon: Meredith Pel, MD;  Location: [place];  Service: Orthopedics;  Laterality: Right;  . TUBAL LIGATION      Social History   Socioeconomic History  . Marital status: Married    Spouse name: Not on file  . Number of children: Not on file  . Years of education: Not on file  . Highest education level: Not on file  Occupational History  . 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 Financial resource strain: Not on file  . Food insecurity:    Worry: Not on file    Inability: Not on file  . Transportation needs:     Medical: Not on file    Non-medical: Not on file  Tobacco Use  . Smoking status: Never Smoker  . Smokeless tobacco: Never Used  Substance and Sexual Activity  . Alcohol use: No    Comment: rare  . Drug use: No  . Sexual activity: Never  Lifestyle  . Physical activity:    Days per week: Not on file    Minutes per session: Not on file  . Stress: Not on file  Relationships  . Social connections:    Talks on phone: Not on file    Gets together: Not on file    Attends religious service: Not on file    Active member of club or organization: Not on file    Attends meetings of clubs or organizations: Not on file    Relationship status: Not on file  Other Topics Concern  . Not on file  Social History Narrative  . Not on file    Family History  Problem Relation Age of Onset  . Diabetes Mother   . Kidney disease Father   . Prostate cancer Father   . Colitis Brother   . Leukemia Brother   . Multiple sclerosis Sister   . Colon polyps Unknown   . Breast cancer Maternal Aunt   . Breast cancer Cousin   . Colon cancer Neg Hx     Review of Systems  Constitutional: Negative for chills and fever.  Respiratory: Positive for cough (PND). Negative for shortness of breath and wheezing.   Cardiovascular: Positive for leg swelling. Negative for chest pain and palpitations.  Gastrointestinal: Positive for abdominal pain (intermitent epigastric pain).  Neurological: Negative for headaches.  Psychiatric/Behavioral: Positive for dysphoric mood and sleep disturbance. The patient is nervous/anxious.  Objective:   Vitals:   10/07/17 1006  BP: 120/82  Pulse: 73  Resp: 16  Temp: 98.7 F (37.1 C)  SpO2: 97%   BP Readings from Last 3 Encounters:  10/07/17 120/82  07/05/17 114/76  05/14/17 130/80   Wt Readings from Last 3 Encounters:  10/07/17 237 lb (107.5 kg)  07/05/17 246 lb (111.6 kg)  05/14/17 247 lb (112 kg)   Body mass index is 44.78 kg/m.   Physical Exam      Constitutional: Appears well-developed and well-nourished. No distress.  HENT:  Head: Normocephalic and atraumatic.  Neck: Neck supple. No tracheal deviation present. No thyromegaly present.  No cervical lymphadenopathy Cardiovascular: Normal rate, regular rhythm and normal heart sounds.   No murmur heard. No carotid bruit .  B/l edema - mild - wearing compression socks Pulmonary/Chest: Effort normal and breath sounds normal. No respiratory distress. No has no wheezes. No rales.  Skin: Skin is warm and dry. Not diaphoretic.  Psychiatric: mildly depressed mood and affect. Behavior is normal.       Assessment & Plan:    See Problem List for Assessment and Plan of chronic medical problems.

## 2017-10-07 ENCOUNTER — Ambulatory Visit: Payer: BLUE CROSS/BLUE SHIELD | Admitting: Internal Medicine

## 2017-10-07 ENCOUNTER — Encounter: Payer: Self-pay | Admitting: Internal Medicine

## 2017-10-07 ENCOUNTER — Telehealth: Payer: Self-pay | Admitting: Internal Medicine

## 2017-10-07 VITALS — BP 120/82 | HR 73 | Temp 98.7°F | Resp 16 | Ht 61.0 in | Wt 237.0 lb

## 2017-10-07 DIAGNOSIS — M1710 Unilateral primary osteoarthritis, unspecified knee: Secondary | ICD-10-CM | POA: Diagnosis not present

## 2017-10-07 DIAGNOSIS — R413 Other amnesia: Secondary | ICD-10-CM | POA: Diagnosis not present

## 2017-10-07 DIAGNOSIS — F419 Anxiety disorder, unspecified: Secondary | ICD-10-CM

## 2017-10-07 DIAGNOSIS — F329 Major depressive disorder, single episode, unspecified: Secondary | ICD-10-CM

## 2017-10-07 DIAGNOSIS — G629 Polyneuropathy, unspecified: Secondary | ICD-10-CM

## 2017-10-07 DIAGNOSIS — L659 Nonscarring hair loss, unspecified: Secondary | ICD-10-CM

## 2017-10-07 DIAGNOSIS — F32A Depression, unspecified: Secondary | ICD-10-CM

## 2017-10-07 DIAGNOSIS — I890 Lymphedema, not elsewhere classified: Secondary | ICD-10-CM | POA: Diagnosis not present

## 2017-10-07 HISTORY — DX: Polyneuropathy, unspecified: G62.9

## 2017-10-07 MED ORDER — ESCITALOPRAM OXALATE 10 MG PO TABS: 10.0000 mg | ORAL_TABLET | Freq: Every day | ORAL | 5 refills | Status: DC

## 2017-10-07 NOTE — Telephone Encounter (Signed)
Appointment is today 4/15 @ 10am. Forms have been given to [facility] for the appointment.

## 2017-10-07 NOTE — Patient Instructions (Addendum)
  Medications reviewed and updated.  Changes include taking lexparo daily.  Your prescription(s) have been submitted to your pharmacy. Please take as directed and contact our office if you believe you are having problem(s) with the medication(s).  A referral was ordered for dermatology, orthopedics and vascular surgery.   Please followup in 1 month

## 2017-10-07 NOTE — Assessment & Plan Note (Signed)
She had an EMG on 10/2016 that showed no neuropathy

## 2017-10-07 NOTE — Telephone Encounter (Signed)
Pt wanted to call back and wanted to let the dr know about her mental illness.   Didn't give anymore details other the that.

## 2017-10-07 NOTE — Telephone Encounter (Signed)
I do not know a pain patch she is speaking of.  She needs to be more specific about what mental illness she is speaking of as well.

## 2017-10-07 NOTE — Telephone Encounter (Signed)
Spoke to pt and she states she forgot to ask about a pain pad for her arthritis & back

## 2017-10-08 DIAGNOSIS — R413 Other amnesia: Secondary | ICD-10-CM | POA: Insufficient documentation

## 2017-10-08 DIAGNOSIS — F419 Anxiety disorder, unspecified: Secondary | ICD-10-CM | POA: Insufficient documentation

## 2017-10-08 DIAGNOSIS — F411 Generalized anxiety disorder: Secondary | ICD-10-CM | POA: Insufficient documentation

## 2017-10-08 DIAGNOSIS — F329 Major depressive disorder, single episode, unspecified: Secondary | ICD-10-CM

## 2017-10-08 DIAGNOSIS — F32A Depression, unspecified: Secondary | ICD-10-CM | POA: Insufficient documentation

## 2017-10-08 HISTORY — DX: Major depressive disorder, single episode, unspecified: F32.9

## 2017-10-08 HISTORY — DX: Generalized anxiety disorder: F41.1

## 2017-10-08 MED ORDER — LIDOCAINE 5 % EX PTCH: 1.0000 | MEDICATED_PATCH | CUTANEOUS | 0 refills | Status: DC

## 2017-10-08 NOTE — Assessment & Plan Note (Signed)
Experiencing increased depression, anxiety and irritability Will try lexapro 10 mg daily

## 2017-10-08 NOTE — Telephone Encounter (Signed)
I sent the patches to her pof - they may or may not be covered.   Noted about disability.

## 2017-10-08 NOTE — Assessment & Plan Note (Signed)
Chronic bilateral knee arthritis Following with orthopedics, but would like to see a different orthopedic for second opinion Uses a cane or walker to ambulate Encouraged her to increase her activity as much as possible and decrease portions to hopefully allow for weight loss  Referral to orthopedics placed

## 2017-10-08 NOTE — Assessment & Plan Note (Signed)
She is concerned regarding her memory-states it is harder to remember things and seems to be forgetting more Wondering who she can see to have this evaluated Advised that she reschedule her appointment with neurology-Dr. Tomi Likens.  He can start the initial evaluation for this

## 2017-10-08 NOTE — Telephone Encounter (Signed)
Pt states that she has used Lidoderm 5% patches in the past.   [facility] saw pt yesterday and related pts disabilities to her Mental illness of depression and arthritis.

## 2017-10-08 NOTE — Assessment & Plan Note (Signed)
Chronic lymphedema Taking Lasix daily-continue Would like to see vascular for further evaluation-referral ordered Encouraged increasing her exercise as much as possible Encouraged weight loss

## 2017-10-08 NOTE — Assessment & Plan Note (Signed)
Will refer to derm for further evaluation

## 2017-10-09 NOTE — Telephone Encounter (Signed)
Forms completed and mailed back to patient for signature. Copy made for chart.

## 2017-10-23 ENCOUNTER — Other Ambulatory Visit: Payer: Self-pay | Admitting: Internal Medicine

## 2017-10-27 ENCOUNTER — Other Ambulatory Visit: Payer: Self-pay | Admitting: Internal Medicine

## 2017-10-27 DIAGNOSIS — I890 Lymphedema, not elsewhere classified: Secondary | ICD-10-CM

## 2017-11-03 NOTE — Progress Notes (Deleted)
Subjective:    Patient ID: Janet Le, female    DOB: 09/08/1955, 62 y.o.   MRN: [id number]  HPI The patient is here for follow up.  Anxiety, depression:  We started her on lexapro one month ago.   Prediabetes:  She is compliant with a low sugar/carbohydrate diet.  She is exercising regularly.  Hypertension: She is taking her medication daily. She is compliant with a low sodium diet.  She denies chest pain, palpitations, edema, shortness of breath and regular headaches. She is exercising regularly.  She does not monitor her blood pressure at home.    Obesity:    Medications and allergies reviewed with patient and updated if appropriate.  Patient Active Problem List   Diagnosis Date Noted  . Memory difficulties 10/08/2017  . Anxiety and depression 10/08/2017  . Decreased sensation of foot 10/07/2017  . LVH (left ventricular hypertrophy), mild 05/18/2017  . Arthritis of knee 10/30/2016  . S/P knee surgery 10/30/2016  . Burning pain 10/15/2016  . Low back pain 08/01/2016  . Chronic low back pain 07/18/2016  . Prediabetes 05/30/2016  . Cervicalgia 05/30/2016  . Right knee pain 05/30/2016  . Hair loss 05/30/2016  . Osteopenia 11/18/2015  . Thrombocytopenia ([place]) 05/30/2015  . Arthritis of left lower extremity 02/02/2015  . Left knee pain 09/08/2014  . Gout 12/11/2013  . Obesity with serious comorbidity 11/06/2013  . Osteoporosis   . Left lumbar radiculopathy 04/13/2012  . Renal cell cancer ([place])   . External hemorrhoid 11/14/2010  . IBS (irritable bowel syndrome) 11/14/2010  . GERD (gastroesophageal reflux disease) 11/14/2010  . HYPERCHOLESTEROLEMIA, MILD 03/16/2010  . Lymphedema 03/15/2010  . ALLERGIC RHINITIS 08/31/2009  . MIGRAINE HEADACHE 06/14/2009  . SINUSITIS, CHRONIC 06/14/2009  . SYNCOPE 05/16/2009  . Essential hypertension 05/06/2009  . CONSTIPATION, CHRONIC 05/06/2009    Current Outpatient Medications on File Prior to Visit  Medication Sig  Dispense Refill  . ACCU-CHEK SOFTCLIX LANCETS lancets by Other route. Use twice a day for blood sugar     . alendronate (FOSAMAX) 70 MG tablet TAKE 1 TABLET ONCE A WEEK WITH A FULL GLASS OF WATER ON AN EMPTY STOMACH 12 tablet 1  . AMBULATORY NON FORMULARY MEDICATION 1 Units by Other route daily. Diabetic Shoes 1 Units 0  . amLODipine (NORVASC) 5 MG tablet Take 1 tablet (5 mg total) by mouth daily. 30 tablet 0  . atorvastatin (LIPITOR) 10 MG tablet Take 1 tablet (10 mg total) by mouth daily. Short term supply 14 tablet 0  . calcium-vitamin D (OSCAL WITH D) 500-200 MG-UNIT tablet Take 1 tablet by mouth daily.     . Diclofenac Sodium 2 % SOLN Apply 1 pump twice daily. (Patient taking differently: Apply 1 application topically 2 (two) times daily as needed (PAIN). Apply 1 pump twice daily.) 112 g 1  . escitalopram (LEXAPRO) 10 MG tablet Take 1 tablet (10 mg total) by mouth daily. 30 tablet 5  . fluticasone (FLONASE) 50 MCG/ACT nasal spray Place 1 spray into both nostrils daily. 16 g 2  . furosemide (LASIX) 20 MG tablet TAKE 1 TABLET DAILY 90 tablet 1  . glucose blood (ONETOUCH VERIO) test strip Use one strip to check blood sugar daily 100 each 4  . Insulin Pen Needle (NOVOFINE) 32G X 6 MM MISC Use daily with Saxenda 100 each 1  . lidocaine (LIDODERM) 5 % Place 1 patch onto the skin daily. Remove & Discard patch within 12 hours or as directed by MD  30 patch 0  . Linaclotide (LINZESS) 145 MCG CAPS capsule Take 1 capsule (145 mcg total) by mouth daily. (Patient taking differently: Take 145 mcg by mouth daily as needed (CONSTIPATION). ) 90 capsule 1  . Lorcaserin HCl 10 MG TABS Take 10 mg by mouth 2 (two) times daily. 28 tablet 0  . losartan (COZAAR) 100 MG tablet TAKE 1 TABLET DAILY 90 tablet 1  . methocarbamol (ROBAXIN) 500 MG tablet Take 1 tablet (500 mg total) by mouth every 6 (six) hours as needed for muscle spasms. 30 tablet 2  . NONFORMULARY OR COMPOUNDED ITEM [facility]  Onychomycosis Nail  Lacquer -  Fluconazole 2%, Terbinafine 1% DMSO Apply to affected nail once daily Qty. 120 gm 3 refills 1 each 2  . ondansetron (ZOFRAN) 4 MG tablet Take 1-2 tablets (4-8 mg total) by mouth every 8 (eight) hours as needed for nausea or vomiting. 40 tablet 0  . ONETOUCH DELICA LANCETS 67E MISC 1 each by Does not apply route daily. Use to help check blood sugars once daily Dx 250.00 100 each 3  . Prenatal Vit-Fe Fumarate-FA (PNV PRENATAL PLUS MULTIVITAMIN) 27-1 MG TABS TAKE 1 TABLET DAILY AT 12 NOON 60 tablet 3  . propranolol (INDERAL) 80 MG tablet TAKE 1 TABLET TWICE A DAY 180 tablet 1  . terbinafine (LAMISIL) 250 MG tablet      No current facility-administered medications on file prior to visit.     Past Medical History:  Diagnosis Date  . Allergic rhinitis, cause unspecified   . Anxiety   . Blood dyscrasia    platelets low in past  . Chronic back pain   . Chronic constipation   . Chronic sinusitis   . Depression   . Endometriosis   . ENDOMETRIOSIS 05/06/2009   Qualifier: Diagnosis of  By: Varney Daily RN, Butch Penny    . Esophageal stricture   . GERD (gastroesophageal reflux disease)   . Hemorrhoids   . Hiatal hernia   . Hyperlipidemia   . Hypertension   . Irritable bowel syndrome   . Osteoarthritis   . Personal history of diabetes mellitus    controlled with diet only  . Renal cell cancer ([place]) 11/2010 dx   R, s/p cryoablation 02/16/11  . Syncopal episodes    since childhood    Past Surgical History:  Procedure Laterality Date  . BREAST CYST EXCISION Bilateral    No visable scar   . BREAST SURGERY     bilateral, cyst removal  . fallopian tube removed    . FUNCTIONAL ENDOSCOPIC SINUS SURGERY    . LASER ABLATION OF THE CERVIX    . 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 TOTAL KNEE ARTHROPLASTY Right 10/30/2016   Procedure: RIGHT TOTAL KNEE ARTHROPLASTY;  Surgeon: Meredith Pel, MD;  Location: [facility];  Service: Orthopedics;  Laterality: Right;    . TUBAL LIGATION      Social History   Socioeconomic History  . Marital status: Married    Spouse name: Not on file  . Number of children: Not on file  . Years of education: Not on file  . Highest education level: Not on file  Occupational History  . 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 Financial resource strain: Not on file  . Food insecurity:    Worry: Not on file    Inability: Not on file  . Transportation  needs:    Medical: Not on file    Non-medical: Not on file  Tobacco Use  . Smoking status: Never Smoker  . Smokeless tobacco: Never Used  Substance and Sexual Activity  . Alcohol use: No    Comment: rare  . Drug use: No  . Sexual activity: Never  Lifestyle  . Physical activity:    Days per week: Not on file    Minutes per session: Not on file  . Stress: Not on file  Relationships  . Social connections:    Talks on phone: Not on file    Gets together: Not on file    Attends religious service: Not on file    Active member of club or organization: Not on file    Attends meetings of clubs or organizations: Not on file    Relationship status: Not on file  Other Topics Concern  . Not on file  Social History Narrative  . Not on file    Family History  Problem Relation Age of Onset  . Diabetes Mother   . Kidney disease Father   . Prostate cancer Father   . Colitis Brother   . Leukemia Brother   . Multiple sclerosis Sister   . Colon polyps Unknown   . Breast cancer Maternal Aunt   . Breast cancer Cousin   . Colon cancer Neg Hx     Review of Systems     Objective:  There were no vitals filed for this visit. BP Readings from Last 3 Encounters:  10/07/17 120/82  07/05/17 114/76  05/14/17 130/80   Wt Readings from Last 3 Encounters:  10/07/17 237 lb (107.5 kg)  07/05/17 246 lb (111.6 kg)  05/14/17 247 lb (112 kg)   There is no height or weight on file to calculate BMI.   Physical  Exam    Constitutional: Appears well-developed and well-nourished. No distress.  HENT:  Head: Normocephalic and atraumatic.  Neck: Neck supple. No tracheal deviation present. No thyromegaly present.  No cervical lymphadenopathy Cardiovascular: Normal rate, regular rhythm and normal heart sounds.   No murmur heard. No carotid bruit .  No edema Pulmonary/Chest: Effort normal and breath sounds normal. No respiratory distress. No has no wheezes. No rales.  Skin: Skin is warm and dry. Not diaphoretic.  Psychiatric: Normal mood and affect. Behavior is normal.      Assessment & Plan:    See Problem List for Assessment and Plan of chronic medical problems.

## 2017-11-04 ENCOUNTER — Ambulatory Visit: Payer: BLUE CROSS/BLUE SHIELD | Admitting: Internal Medicine

## 2017-11-04 ENCOUNTER — Telehealth: Payer: Self-pay | Admitting: Internal Medicine

## 2017-11-04 NOTE — Telephone Encounter (Signed)
Pt has rescheduled for 5/23

## 2017-11-04 NOTE — Telephone Encounter (Signed)
Pt states her medicine LEXAPRO, is not working,  She would like something else or a stronger dose.  Please advise

## 2017-11-05 ENCOUNTER — Telehealth: Payer: Self-pay | Admitting: Internal Medicine

## 2017-11-05 ENCOUNTER — Telehealth (INDEPENDENT_AMBULATORY_CARE_PROVIDER_SITE_OTHER): Payer: Self-pay | Admitting: Orthopedic Surgery

## 2017-11-05 DIAGNOSIS — N6320 Unspecified lump in the left breast, unspecified quadrant: Secondary | ICD-10-CM | POA: Diagnosis not present

## 2017-11-05 DIAGNOSIS — N644 Mastodynia: Secondary | ICD-10-CM | POA: Diagnosis not present

## 2017-11-05 MED ORDER — ESCITALOPRAM OXALATE 20 MG PO TABS: 20.0000 mg | ORAL_TABLET | Freq: Every day | ORAL | 1 refills | Status: DC

## 2017-11-05 MED ORDER — AMOXICILLIN 500 MG PO TABS: ORAL_TABLET | ORAL | 0 refills | Status: DC

## 2017-11-05 NOTE — Telephone Encounter (Signed)
Let patient know we will need to discuss in person next week

## 2017-11-05 NOTE — Telephone Encounter (Signed)
Spoke with pt, states she would like increase to 20 mg before changing medications.   Pt would also like to discuss getting colonoscopy and bone density next week at visit.

## 2017-11-05 NOTE — Telephone Encounter (Signed)
Patient called stating has Dental appt tomorrow and had a full knee replacement last year in May.  Wanting to know if she needs an antibiotic called in.  Please call patient to advise.

## 2017-11-05 NOTE — Telephone Encounter (Signed)
LVM for pt to call back to discuss.  

## 2017-11-05 NOTE — Addendum Note (Signed)
Addended by: Terence Lux B on: 11/05/2017 02:42 PM   Modules accepted: Orders

## 2017-11-05 NOTE — Telephone Encounter (Signed)
Pt has already been seen at the lymphedema clinic Dec 2018 Please advise  Copied from [place] [phone]. Topic: Referral - Status >> Nov 05, 2017 12:14 PM Vernona Rieger wrote: Reason for CRM: Patient said she was suppose to be referred to a vascular and vein doctor for swelling feet/legs. Please advise >> Nov 05, 2017  1:13 PM Morey Hummingbird wrote: Vein amd vascular could not see her for lymphedema, she is asking about her referral for the lyphedema clinic.  >> Nov 05, 2017  2:20 PM Synthia Innocent wrote: Patient checking status, please advise

## 2017-11-05 NOTE — Addendum Note (Signed)
Addended by: Binnie Rail on: 11/05/2017 04:39 PM   Modules accepted: Orders

## 2017-11-05 NOTE — Telephone Encounter (Signed)
IC patient advised did need pre-med prior to dental procedure. Sent rx to pharmacy.

## 2017-11-05 NOTE — Telephone Encounter (Signed)
Pt aware.

## 2017-11-05 NOTE — Telephone Encounter (Signed)
Did the 10 mg do anything?  If it did then increasing it to 20 mg may help and we can try that.  If it did not help at all the we should switch to a different medication.  Let me know.

## 2017-11-05 NOTE — Telephone Encounter (Signed)
rx sent - 20 mg daily

## 2017-11-06 ENCOUNTER — Other Ambulatory Visit: Payer: Self-pay | Admitting: Radiology

## 2017-11-06 DIAGNOSIS — N6320 Unspecified lump in the left breast, unspecified quadrant: Secondary | ICD-10-CM

## 2017-11-06 DIAGNOSIS — N644 Mastodynia: Secondary | ICD-10-CM | POA: Diagnosis not present

## 2017-11-06 NOTE — Telephone Encounter (Signed)
Agree; thx 

## 2017-11-08 ENCOUNTER — Ambulatory Visit
Admission: RE | Admit: 2017-11-08 | Discharge: 2017-11-08 | Disposition: A | Discharge status: Home / Self Care | Payer: BLUE CROSS/BLUE SHIELD | Source: Ambulatory Visit | Attending: Radiology | Admitting: Radiology

## 2017-11-08 DIAGNOSIS — N6320 Unspecified lump in the left breast, unspecified quadrant: Secondary | ICD-10-CM

## 2017-11-08 DIAGNOSIS — R928 Other abnormal and inconclusive findings on diagnostic imaging of breast: Secondary | ICD-10-CM | POA: Diagnosis not present

## 2017-11-11 ENCOUNTER — Ambulatory Visit: Payer: BLUE CROSS/BLUE SHIELD | Admitting: Neurology

## 2017-11-14 ENCOUNTER — Ambulatory Visit: Payer: BLUE CROSS/BLUE SHIELD | Admitting: Internal Medicine

## 2017-11-14 ENCOUNTER — Encounter: Payer: Self-pay | Admitting: Internal Medicine

## 2017-11-14 ENCOUNTER — Other Ambulatory Visit: Payer: Self-pay | Admitting: Radiology

## 2017-11-14 VITALS — BP 142/80 | HR 60 | Temp 98.3°F | Resp 16 | Wt 234.0 lb

## 2017-11-14 DIAGNOSIS — M549 Dorsalgia, unspecified: Secondary | ICD-10-CM

## 2017-11-14 DIAGNOSIS — I10 Essential (primary) hypertension: Secondary | ICD-10-CM | POA: Diagnosis not present

## 2017-11-14 DIAGNOSIS — R5383 Other fatigue: Secondary | ICD-10-CM

## 2017-11-14 DIAGNOSIS — G8929 Other chronic pain: Secondary | ICD-10-CM

## 2017-11-14 DIAGNOSIS — M25559 Pain in unspecified hip: Secondary | ICD-10-CM | POA: Insufficient documentation

## 2017-11-14 DIAGNOSIS — R7303 Prediabetes: Secondary | ICD-10-CM | POA: Diagnosis not present

## 2017-11-14 DIAGNOSIS — E66813 Obesity, class 3: Secondary | ICD-10-CM

## 2017-11-14 DIAGNOSIS — M810 Age-related osteoporosis without current pathological fracture: Secondary | ICD-10-CM

## 2017-11-14 DIAGNOSIS — R413 Other amnesia: Secondary | ICD-10-CM

## 2017-11-14 DIAGNOSIS — Z6841 Body Mass Index (BMI) 40.0 and over, adult: Secondary | ICD-10-CM

## 2017-11-14 DIAGNOSIS — F32A Depression, unspecified: Secondary | ICD-10-CM

## 2017-11-14 DIAGNOSIS — E7800 Pure hypercholesterolemia, unspecified: Secondary | ICD-10-CM | POA: Diagnosis not present

## 2017-11-14 DIAGNOSIS — F419 Anxiety disorder, unspecified: Secondary | ICD-10-CM

## 2017-11-14 DIAGNOSIS — I890 Lymphedema, not elsewhere classified: Secondary | ICD-10-CM

## 2017-11-14 DIAGNOSIS — Z139 Encounter for screening, unspecified: Secondary | ICD-10-CM

## 2017-11-14 DIAGNOSIS — F329 Major depressive disorder, single episode, unspecified: Secondary | ICD-10-CM

## 2017-11-14 HISTORY — DX: Pain in unspecified hip: M25.559

## 2017-11-14 HISTORY — DX: Other fatigue: R53.83

## 2017-11-14 NOTE — Assessment & Plan Note (Addendum)
Not much change with lexparo - we increased it up to 20 mg - has not seen much difference Will give it a couple of more weeks If not effective will try cymbalta, which may also help with some of her chronic pain

## 2017-11-14 NOTE — Patient Instructions (Addendum)
Call and schedule your mammogram and bone density scan -  [facility] 7 a.m.-6:30 p.m., Monday 7 a.m.-5 p.m., Tuesday-Friday Schedule an appointment by calling [phone]  Test(s) ordered today. Your results will be released to [place] (or called to you) after review, usually within 72hours after test completion. If any changes need to be made, you will be notified at that same time.    Medications reviewed and updated.  No changes recommended at this time.   A referral was ordered for pulmonary for evaluation of sleep apnea and Dr Si Raider at neurology.   Please followup in 3 months

## 2017-11-14 NOTE — Assessment & Plan Note (Signed)
Check a1c Low sugar / carb diet Stressed regular exercise, weight loss  

## 2017-11-14 NOTE — Assessment & Plan Note (Signed)
Referred to PT Continue compression socks Stressed the importance of continuing weight loss efforts Elevating legs when able Stressed regular exercise Continue Lasix 20 mg daily

## 2017-11-14 NOTE — Assessment & Plan Note (Signed)
Chronic fatigue Likely multifactorial Possibly sleep apnea is contributing-has had a couple of different sleep studies-once that she did have sleep apnea, and others that she did not Not currently on CPAP Will refer to pulmonary for further evaluation Basic blood work today including CBC, CMP, TSH, B12 Encouraged regular exercise and weight loss Depression may be contributing-currently on an SSRI and if this is not effective over the next couple weeks we will change to a different one

## 2017-11-14 NOTE — Assessment & Plan Note (Signed)
Referred to orthopedics for further evaluation

## 2017-11-14 NOTE — Progress Notes (Signed)
Subjective:    Patient ID: Janet Le, female    DOB: [birth year], 62 y.o.   MRN: [id number]  HPI The patient is here for follow up.  Anxiety, depression:  We started lexapro 5 weeks ago.  We increased it recently.  She is taking her medication daily as prescribed. She denies any side effects from the medication.  She has not noticed much difference since starting the medication.  She still feels anxious and depressed.  Prediabetes:  She is compliant with a low sugar/carbohydrate diet.  She is exercising some.  Hypertension: She is taking her medication daily. She is compliant with a low sodium diet.  She denies chest pain, palpitations, shortness of breath and regular headaches. She is exercising minimally.  She does not monitor her blood pressure at home.    Hyperlipidemia: She is taking her medication daily. She is compliant with a low fat/cholesterol diet. She is exercising minimally. She denies myalgias.   B/l LE lymphedema:  She is taking lasix 20 mg daily.  She had wanted to see vascular, but since she has lymphedema they did not want to see her.  She wonders if she has vascular insufficiency in addition to lymphedema.  She has been referred to physical therapy.  She is wearing compression socks since.  She has lost some weight.  Fatigue:  It is chronic and unchanged.  She has difficulty getting to sleep and then she wakes up around 3-4 and eventually goes back to sleep and will sleep for up to several hours.    She has been diagnosed with sleep apnea in the past.  She states after that original test she was retested again and states that she did not have it.  She is not currently on a CPAP.  She does feel tired throughout the day.  Chronic upper back, lower back and bilateral hip pain: She has done physical therapy for this.  She is currently using lidocaine patches, which she thinks does help.  Her daughter wonders if there are any other treatment options.  Obesity: She does  want to lose weight and has lost weight since she was here in January.  We have discussed the weight management clinic and she is interested in going there.  Medications and allergies reviewed with patient and updated if appropriate.  Patient Active Problem List   Diagnosis Date Noted  . Memory difficulties 10/08/2017  . Anxiety and depression 10/08/2017  . Decreased sensation of foot 10/07/2017  . LVH (left ventricular hypertrophy), mild 05/18/2017  . Arthritis of knee 10/30/2016  . S/P knee surgery 10/30/2016  . Burning pain 10/15/2016  . Low back pain 08/01/2016  . Chronic low back pain 07/18/2016  . Prediabetes 05/30/2016  . Cervicalgia 05/30/2016  . Right knee pain 05/30/2016  . Hair loss 05/30/2016  . Osteopenia 11/18/2015  . Thrombocytopenia ([place]) 05/30/2015  . Arthritis of left lower extremity 02/02/2015  . Left knee pain 09/08/2014  . Gout 12/11/2013  . Obesity with serious comorbidity 11/06/2013  . Osteoporosis   . Left lumbar radiculopathy 04/13/2012  . Renal cell cancer ([place])   . External hemorrhoid 11/14/2010  . IBS (irritable bowel syndrome) 11/14/2010  . GERD (gastroesophageal reflux disease) 11/14/2010  . HYPERCHOLESTEROLEMIA, MILD 03/16/2010  . Lymphedema 03/15/2010  . ALLERGIC RHINITIS 08/31/2009  . MIGRAINE HEADACHE 06/14/2009  . SINUSITIS, CHRONIC 06/14/2009  . SYNCOPE 05/16/2009  . Essential hypertension 05/06/2009  . CONSTIPATION, CHRONIC 05/06/2009    Current Outpatient Medications on File  Prior to Visit  Medication Sig Dispense Refill  . alendronate (FOSAMAX) 70 MG tablet TAKE 1 TABLET ONCE A WEEK WITH A FULL GLASS OF WATER ON AN EMPTY STOMACH 12 tablet 1  . AMBULATORY NON FORMULARY MEDICATION 1 Units by Other route daily. Diabetic Shoes 1 Units 0  . amLODipine (NORVASC) 5 MG tablet Take 1 tablet (5 mg total) by mouth daily. 30 tablet 0  . amoxicillin (AMOXIL) 500 MG tablet Take 2 grams 1 hour prior to dental procedure 16 tablet 0  . atorvastatin  (LIPITOR) 10 MG tablet Take 1 tablet (10 mg total) by mouth daily. Short term supply 14 tablet 0  . calcium-vitamin D (OSCAL WITH D) 500-200 MG-UNIT tablet Take 1 tablet by mouth daily.     . Diclofenac Sodium 2 % SOLN Apply 1 pump twice daily. (Patient taking differently: Apply 1 application topically 2 (two) times daily as needed (PAIN). Apply 1 pump twice daily.) 112 g 1  . escitalopram (LEXAPRO) 20 MG tablet Take 1 tablet (20 mg total) by mouth daily. 30 tablet 1  . fluticasone (FLONASE) 50 MCG/ACT nasal spray Place 1 spray into both nostrils daily. 16 g 2  . furosemide (LASIX) 20 MG tablet TAKE 1 TABLET DAILY 90 tablet 1  . glucose blood (ONETOUCH VERIO) test strip Use one strip to check blood sugar daily 100 each 4  . lidocaine (LIDODERM) 5 % Place 1 patch onto the skin daily. Remove & Discard patch within 12 hours or as directed by MD 30 patch 0  . Linaclotide (LINZESS) 145 MCG CAPS capsule Take 1 capsule (145 mcg total) by mouth daily. (Patient taking differently: Take 145 mcg by mouth daily as needed (CONSTIPATION). ) 90 capsule 1  . losartan (COZAAR) 100 MG tablet TAKE 1 TABLET DAILY 90 tablet 1  . methocarbamol (ROBAXIN) 500 MG tablet Take 1 tablet (500 mg total) by mouth every 6 (six) hours as needed for muscle spasms. 30 tablet 2  . NONFORMULARY OR COMPOUNDED ITEM [facility]  Onychomycosis Nail Lacquer -  Fluconazole 2%, Terbinafine 1% DMSO Apply to affected nail once daily Qty. 120 gm 3 refills 1 each 2  . ondansetron (ZOFRAN) 4 MG tablet Take 1-2 tablets (4-8 mg total) by mouth every 8 (eight) hours as needed for nausea or vomiting. 40 tablet 0  . ONETOUCH DELICA LANCETS 96E MISC 1 each by Does not apply route daily. Use to help check blood sugars once daily Dx 250.00 100 each 3  . Prenatal Vit-Fe Fumarate-FA (PNV PRENATAL PLUS MULTIVITAMIN) 27-1 MG TABS TAKE 1 TABLET DAILY AT 12 NOON 60 tablet 3  . propranolol (INDERAL) 80 MG tablet TAKE 1 TABLET TWICE A DAY 180 tablet 1     No current facility-administered medications on file prior to visit.     Past Medical History:  Diagnosis Date  . Allergic rhinitis, cause unspecified   . Anxiety   . Blood dyscrasia    platelets low in past  . Chronic back pain   . Chronic constipation   . Chronic sinusitis   . Depression   . Endometriosis   . ENDOMETRIOSIS 05/06/2009   Qualifier: Diagnosis of  By: Varney Daily RN, Butch Penny    . Esophageal stricture   . GERD (gastroesophageal reflux disease)   . Hemorrhoids   . Hiatal hernia   . Hyperlipidemia   . Hypertension   . Irritable bowel syndrome   . Osteoarthritis   . Personal history of diabetes mellitus    controlled with  diet only  . Renal cell cancer ([place]) 11/2010 dx   R, s/p cryoablation 02/16/11  . Syncopal episodes    since childhood    Past Surgical History:  Procedure Laterality Date  . BREAST CYST EXCISION Bilateral over 10 years ago   No visable scar   . BREAST SURGERY     bilateral, cyst removal  . fallopian tube removed    . FUNCTIONAL ENDOSCOPIC SINUS SURGERY    . LASER ABLATION OF THE CERVIX    . 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 TOTAL KNEE ARTHROPLASTY Right 10/30/2016   Procedure: RIGHT TOTAL KNEE ARTHROPLASTY;  Surgeon: Meredith Pel, MD;  Location: [facility];  Service: Orthopedics;  Laterality: Right;  . TUBAL LIGATION      Social History   Socioeconomic History  . Marital status: Married    Spouse name: Not on file  . Number of children: Not on file  . Years of education: Not on file  . Highest education level: Not on file  Occupational History  . 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 Financial resource strain: Not on file  . Food insecurity:    Worry: Not on file    Inability: Not on file  . Transportation needs:    Medical: Not on file    Non-medical: Not on file  Tobacco Use  . Smoking status: Never Smoker  . Smokeless tobacco:  Never Used  Substance and Sexual Activity  . Alcohol use: No    Comment: rare  . Drug use: No  . Sexual activity: Never  Lifestyle  . Physical activity:    Days per week: Not on file    Minutes per session: Not on file  . Stress: Not on file  Relationships  . Social connections:    Talks on phone: Not on file    Gets together: Not on file    Attends religious service: Not on file    Active member of club or organization: Not on file    Attends meetings of clubs or organizations: Not on file    Relationship status: Not on file  Other Topics Concern  . Not on file  Social History Narrative  . Not on file    Family History  Problem Relation Age of Onset  . Diabetes Mother   . Kidney disease Father   . Prostate cancer Father   . Colitis Brother   . Leukemia Brother   . Multiple sclerosis Sister   . Colon polyps Unknown   . Breast cancer Maternal Aunt   . Breast cancer Cousin   . Colon cancer Neg Hx     Review of Systems  Constitutional: Negative for chills and fever.  HENT: Positive for postnasal drip (clear, thick).   Respiratory: Negative for cough, shortness of breath and wheezing.   Cardiovascular: Positive for leg swelling. Negative for chest pain and palpitations.  Neurological: Positive for headaches (occ). Negative for light-headedness.  Psychiatric/Behavioral: Negative for dysphoric mood. The patient is not nervous/anxious.        Objective:   Vitals:   11/14/17 1111  BP: (!) 142/80  Pulse: 60  Resp: 16  Temp: 98.3 F (36.8 C)  SpO2: 97%   BP Readings from Last 3 Encounters:  11/14/17 (!) 142/80  10/07/17 120/82  07/05/17 114/76   Wt Readings from Last 3 Encounters:  11/14/17 234 lb (  106.1 kg)  10/07/17 237 lb (107.5 kg)  07/05/17 246 lb (111.6 kg)   Body mass index is 44.21 kg/m.   Physical Exam    Constitutional: Appears well-developed and well-nourished. No distress.  HENT:  Head: Normocephalic and atraumatic.  Neck: Neck supple.  No tracheal deviation present. No thyromegaly present.  No cervical lymphadenopathy Cardiovascular: Normal rate, regular rhythm and normal heart sounds.   No murmur heard. No carotid bruit .  1+ bilateral lower extremity edema Pulmonary/Chest: Effort normal and breath sounds normal. No respiratory distress. No has no wheezes. No rales.  Skin: Skin is warm and dry. Not diaphoretic.  Psychiatric: Normal mood and affect. Behavior is normal.      Assessment & Plan:    See Problem List for Assessment and Plan of chronic medical problems.

## 2017-11-14 NOTE — Assessment & Plan Note (Signed)
Will discuss with Dr. Tomi Likens when she sees him Will refer to Dr. Si Raider for full memory evaluation Check TSH, B12

## 2017-11-14 NOTE — Assessment & Plan Note (Signed)
Referred to orthopedics

## 2017-11-14 NOTE — Assessment & Plan Note (Signed)
Will refer to the weight management clinic Encouraged her to exercise as much as possible Decrease portions, healthy diet discussed

## 2017-11-14 NOTE — Assessment & Plan Note (Addendum)
Stop fosamax after she finishes what she has at home-started in 2014 and has been on it for 5 years dexa ordered-due this year At her next appointment will reevaluate if she needs additional Fosamax based on recent DEXA Encouraged regular exercise Continue calcium and vitamin D

## 2017-11-14 NOTE — Assessment & Plan Note (Signed)
Check lipid panel  Continue daily statin Regular exercise and healthy diet encouraged  

## 2017-11-14 NOTE — Assessment & Plan Note (Signed)
Based on 3 measures seems to be controlled Continue current medication Continue weight loss efforts CMP

## 2017-11-15 ENCOUNTER — Other Ambulatory Visit (INDEPENDENT_AMBULATORY_CARE_PROVIDER_SITE_OTHER): Payer: BLUE CROSS/BLUE SHIELD

## 2017-11-15 DIAGNOSIS — E7800 Pure hypercholesterolemia, unspecified: Secondary | ICD-10-CM | POA: Diagnosis not present

## 2017-11-15 DIAGNOSIS — R7303 Prediabetes: Secondary | ICD-10-CM | POA: Diagnosis not present

## 2017-11-15 DIAGNOSIS — I10 Essential (primary) hypertension: Secondary | ICD-10-CM | POA: Diagnosis not present

## 2017-11-15 DIAGNOSIS — R5383 Other fatigue: Secondary | ICD-10-CM

## 2017-11-15 DIAGNOSIS — R413 Other amnesia: Secondary | ICD-10-CM | POA: Diagnosis not present

## 2017-11-15 LAB — CBC WITH DIFFERENTIAL/PLATELET
Basophils Absolute: 0 10*3/uL (ref 0.0–0.1)
Basophils Relative: 0.4 % (ref 0.0–3.0)
Eosinophils Absolute: 0.1 10*3/uL (ref 0.0–0.7)
Eosinophils Relative: 1.5 % (ref 0.0–5.0)
HCT: 35.3 % — ABNORMAL LOW (ref 36.0–46.0)
Hemoglobin: 11.7 g/dL — ABNORMAL LOW (ref 12.0–15.0)
Lymphocytes Relative: 36.8 % (ref 12.0–46.0)
Lymphs Abs: 2.2 10*3/uL (ref 0.7–4.0)
MCHC: 33.1 g/dL (ref 30.0–36.0)
MCV: 95.8 fl (ref 78.0–100.0)
Monocytes Absolute: 0.4 10*3/uL (ref 0.1–1.0)
Monocytes Relative: 7.3 % (ref 3.0–12.0)
Neutro Abs: 3.2 10*3/uL (ref 1.4–7.7)
Neutrophils Relative %: 54 % (ref 43.0–77.0)
Platelets: 126 10*3/uL — ABNORMAL LOW (ref 150.0–400.0)
RBC: 3.68 Mil/uL — ABNORMAL LOW (ref 3.87–5.11)
RDW: 15.8 % — ABNORMAL HIGH (ref 11.5–15.5)
WBC: 5.9 10*3/uL (ref 4.0–10.5)

## 2017-11-15 LAB — COMPREHENSIVE METABOLIC PANEL
ALT: 15 U/L (ref 0–35)
AST: 16 U/L (ref 0–37)
Albumin: 3.7 g/dL (ref 3.5–5.2)
Alkaline Phosphatase: 81 U/L (ref 39–117)
BUN: 6 mg/dL (ref 6–23)
CO2: 26 mEq/L (ref 19–32)
Calcium: 8.8 mg/dL (ref 8.4–10.5)
Chloride: 108 mEq/L (ref 96–112)
Creatinine, Ser: 0.8 mg/dL (ref 0.40–1.20)
GFR: 93.37 mL/min (ref >60.00)
Glucose, Bld: 87 mg/dL (ref 70–99)
Potassium: 3.5 mEq/L (ref 3.5–5.1)
Sodium: 140 mEq/L (ref 135–145)
Total Bilirubin: 0.6 mg/dL (ref 0.2–1.2)
Total Protein: 6.6 g/dL (ref 6.0–8.3)

## 2017-11-15 LAB — LIPID PANEL
Cholesterol: 116 mg/dL (ref 0–200)
HDL: 44.4 mg/dL (ref >39.00)
LDL Cholesterol: 59 mg/dL (ref 0–99)
NonHDL: 71.37
Total CHOL/HDL Ratio: 3
Triglycerides: 63 mg/dL (ref 0.0–149.0)
VLDL: 12.6 mg/dL (ref 0.0–40.0)

## 2017-11-15 LAB — TSH: TSH: 1.37 u[IU]/mL (ref 0.35–4.50)

## 2017-11-15 LAB — VITAMIN B12: Vitamin B-12: 528 pg/mL (ref 211–911)

## 2017-11-15 LAB — HEMOGLOBIN A1C: Hgb A1c MFr Bld: 5.2 % (ref 4.6–6.5)

## 2017-11-17 ENCOUNTER — Encounter: Payer: Self-pay | Admitting: Internal Medicine

## 2017-11-20 ENCOUNTER — Encounter: Payer: Self-pay | Admitting: Psychology

## 2017-11-28 DIAGNOSIS — H0289 Other specified disorders of eyelid: Secondary | ICD-10-CM | POA: Diagnosis not present

## 2017-11-28 DIAGNOSIS — H25813 Combined forms of age-related cataract, bilateral: Secondary | ICD-10-CM | POA: Diagnosis not present

## 2017-11-28 DIAGNOSIS — H47323 Drusen of optic disc, bilateral: Secondary | ICD-10-CM | POA: Diagnosis not present

## 2017-12-18 DIAGNOSIS — Z96651 Presence of right artificial knee joint: Secondary | ICD-10-CM | POA: Diagnosis not present

## 2017-12-18 DIAGNOSIS — M1712 Unilateral primary osteoarthritis, left knee: Secondary | ICD-10-CM | POA: Diagnosis not present

## 2017-12-18 DIAGNOSIS — M25562 Pain in left knee: Secondary | ICD-10-CM | POA: Diagnosis not present

## 2017-12-20 ENCOUNTER — Telehealth: Payer: Self-pay | Admitting: Internal Medicine

## 2017-12-20 MED ORDER — TELMISARTAN 80 MG PO TABS: 80.0000 mg | ORAL_TABLET | Freq: Every day | ORAL | 5 refills | Status: DC

## 2017-12-20 NOTE — Telephone Encounter (Signed)
We will change to a similar med - telmisartan -- BP needs to be monitored to make sure this is controlling BP

## 2017-12-20 NOTE — Telephone Encounter (Signed)
Copied from [place] [id number]. Topic: Quick Communication - See Telephone Encounter >> Dec 20, 2017  3:02 PM Antonieta Iba C wrote: CRM for notification. See Telephone encounter for: 12/20/17.  Pt is currently taking Losartan, she said that she was advised that medication causes cancer, pt would like to be advised further.    CB: [phone]

## 2017-12-23 NOTE — Telephone Encounter (Signed)
LVM informing pt

## 2017-12-28 ENCOUNTER — Other Ambulatory Visit: Payer: Self-pay | Admitting: Internal Medicine

## 2017-12-30 ENCOUNTER — Other Ambulatory Visit: Payer: Self-pay | Admitting: Emergency Medicine

## 2017-12-30 MED ORDER — ESCITALOPRAM OXALATE 20 MG PO TABS: 20.0000 mg | ORAL_TABLET | Freq: Every day | ORAL | 1 refills | Status: DC

## 2018-01-02 ENCOUNTER — Ambulatory Visit: Payer: BLUE CROSS/BLUE SHIELD | Admitting: Internal Medicine

## 2018-01-09 ENCOUNTER — Other Ambulatory Visit: Payer: Self-pay | Admitting: Internal Medicine

## 2018-01-09 DIAGNOSIS — M5136 Other intervertebral disc degeneration, lumbar region: Secondary | ICD-10-CM | POA: Insufficient documentation

## 2018-01-09 DIAGNOSIS — M4316 Spondylolisthesis, lumbar region: Secondary | ICD-10-CM | POA: Diagnosis not present

## 2018-01-09 DIAGNOSIS — M51369 Other intervertebral disc degeneration, lumbar region without mention of lumbar back pain or lower extremity pain: Secondary | ICD-10-CM

## 2018-01-09 DIAGNOSIS — M5030 Other cervical disc degeneration, unspecified cervical region: Secondary | ICD-10-CM | POA: Diagnosis not present

## 2018-01-09 DIAGNOSIS — G894 Chronic pain syndrome: Secondary | ICD-10-CM | POA: Diagnosis not present

## 2018-01-09 HISTORY — DX: Other intervertebral disc degeneration, lumbar region without mention of lumbar back pain or lower extremity pain: M51.369

## 2018-01-09 HISTORY — DX: Spondylolisthesis, lumbar region: M43.16

## 2018-01-09 HISTORY — DX: Other intervertebral disc degeneration, lumbar region: M51.36

## 2018-01-09 NOTE — Telephone Encounter (Signed)
Copied from [place] [phone] [phone]. Topic: Quick Communication - See Telephone Encounter >> Jan 09, 2018  2:34 PM Mylinda Latina, NT wrote: CRM for notification. See Telephone encounter for: 01/09/18. Patient states she needs a refill of her  Belviq ( Lorcaserin). This is not on her current med list. Patient states she is out and that she needs a week supply sent to [facility] [address]  But she needs the RX for the 90 day supply sent to   [facility], [place] - [address] [phone] (Phone) [phone] (Fax)

## 2018-01-10 ENCOUNTER — Encounter: Payer: Self-pay | Admitting: Neurology

## 2018-01-10 ENCOUNTER — Ambulatory Visit: Payer: BLUE CROSS/BLUE SHIELD | Admitting: Neurology

## 2018-01-10 VITALS — BP 92/64 | HR 61 | Ht 62.0 in | Wt 222.0 lb

## 2018-01-10 DIAGNOSIS — G629 Polyneuropathy, unspecified: Secondary | ICD-10-CM | POA: Diagnosis not present

## 2018-01-10 DIAGNOSIS — G252 Other specified forms of tremor: Secondary | ICD-10-CM

## 2018-01-10 DIAGNOSIS — G6289 Other specified polyneuropathies: Secondary | ICD-10-CM

## 2018-01-10 NOTE — Telephone Encounter (Signed)
I do not see Belviq on the med list Thx

## 2018-01-10 NOTE — Telephone Encounter (Signed)
Patient request Rx: Belviq 10 mg                             last filled: 07/05/2017 in medication history   LOV: 11/14/17  PCP: Quay Burow  Pharmacy: [facility]  - long term                     [place] term

## 2018-01-10 NOTE — Patient Instructions (Signed)
We will give you another prescription for the diabetic shoes

## 2018-01-10 NOTE — Progress Notes (Signed)
NEUROLOGY FOLLOW UP OFFICE NOTE  Janet Le [id number]  HISTORY OF PRESENT ILLNESS: Janet Le is a 62 year old right-handed woman with history of renal cell carcinoma, hypertension, pre-diabetes, migraine, IBS, lymphedema, osteoarthritis and osteoperosis whom I previously saw for neuropathy presents today for abnormal hand movements.   UPDATE: For several months, she notes that her hands will start moving spontaneously.  It occurs when she is using her hands in particular position.  For example, if she is holding her cell phone with her index finger on the screen, her index finger will start to tremor or even start tapping.  She notices that her hand will shake a bit if she is holding a utensil.  It does not occur when her hands are completely at rest, such as laying flat on her lap.  She has some numbness in the hands related to her neuropathy, but there is no associated pain or weakness.  Over the past several months it is unchanged.   HISTORY: NEUROPATHY:  In 2016, she started experiencing burning pain from the bottom of her feet up the back of her legs to the knees.  It is intermittent and lasts for various and unknown period of time, but not aware of any triggers such as due to position.  In addition, she is reporting increased back pain and spasms, worse when sitting and improved when laying supine.  She has a history of chronic low back pain with left lumbar radiculopathy, as well as right knee pain.  She has been seen and evaluated by orthopedics and pain management.  MRI of lumbar spine from 07/27/16 revealed advanced L4-5 and L5-S1 facet arthrosis with some bilateral neural foraminal stenosis.  Dr. Marlou Sa of orthopedics did not think surgery was an option at that time.  TSH from 10/15/16 was 1.67.  B12 from 10/18/16 was 537.  Hgb A1c from 04/05/17 was 5.6.  NCV-EMG from 10/25/16 demonstrated no evidence of large fiber sensorimotor polyneuropathy or lumbosacral radiculopathy.    Other  History: VISUAL DISTURBANCE:  Symptoms started in 2014.  She describes initially seeing a line of light along the bottom of her visual field in the left eye.  She then developed what looks like bubbles floating up in the temporal aspect of the visual fields of in both eyes.  It lasts only a few seconds and occurs several times a day.  There is no vision loss.  There is no associated headache.  There was a concern for diabetic retinopathy.  She saw Dr. Katy Fitch, an ophthalmologist on 11/02/13.  The exam did not reveal diabetic retinopathy, but a cardiac evaluation was recommended.  Possibly ocular migraines.   DIZZINESS:  Also in 2014, she began feeling dizzy.  She describes it as a feeling of lightheadedness, as if she is going to pass out.  There is no spinning sensation.  She sometimes hears "static" during these spells.  She has been found to have low blood pressure and is orthostatic.  She also began feeling diffuse pain in her joints.  Uric acid level was 11.4.  HCTZ was discontinued today and she was placed on colchicine.     MRI of brain without contrast from 12/19/13 demonstrated incidental small 11 mm dural calcification but nothing concerning.  PAST MEDICAL HISTORY: Past Medical History:  Diagnosis Date  . Allergic rhinitis, cause unspecified   . Anxiety   . Blood dyscrasia    platelets low in past  . Chronic back pain   . Chronic  constipation   . Chronic sinusitis   . Depression   . Endometriosis   . ENDOMETRIOSIS 05/06/2009   Qualifier: Diagnosis of  By: Varney Daily RN, Butch Penny    . Esophageal stricture   . GERD (gastroesophageal reflux disease)   . Hemorrhoids   . Hiatal hernia   . Hyperlipidemia   . Hypertension   . Irritable bowel syndrome   . Osteoarthritis   . Personal history of diabetes mellitus    controlled with diet only  . Renal cell cancer ([place]) 11/2010 dx   R, s/p cryoablation 02/16/11  . Syncopal episodes    since childhood    MEDICATIONS: Current Outpatient  Medications on File Prior to Visit  Medication Sig Dispense Refill  . alendronate (FOSAMAX) 70 MG tablet TAKE 1 TABLET ONCE A WEEK WITH A FULL GLASS OF WATER ON AN EMPTY STOMACH (Patient not taking: Reported on 01/10/2018) 12 tablet 1  . AMBULATORY NON FORMULARY MEDICATION 1 Units by Other route daily. Diabetic Shoes 1 Units 0  . amLODipine (NORVASC) 5 MG tablet Take 1 tablet (5 mg total) by mouth daily. 30 tablet 0  . amoxicillin (AMOXIL) 500 MG tablet Take 2 grams 1 hour prior to dental procedure 16 tablet 0  . atorvastatin (LIPITOR) 10 MG tablet TAKE 1 TABLET DAILY 90 tablet 1  . calcium-vitamin D (OSCAL WITH D) 500-200 MG-UNIT tablet Take 1 tablet by mouth daily.     . Diclofenac Sodium 2 % SOLN Apply 1 pump twice daily. (Patient taking differently: Apply 1 application topically 2 (two) times daily as needed (PAIN). Apply 1 pump twice daily.) 112 g 1  . escitalopram (LEXAPRO) 20 MG tablet Take 1 tablet (20 mg total) by mouth daily. 90 tablet 1  . fluticasone (FLONASE) 50 MCG/ACT nasal spray Place 1 spray into both nostrils daily. 16 g 2  . furosemide (LASIX) 20 MG tablet TAKE 1 TABLET DAILY 90 tablet 1  . glucose blood (ONETOUCH VERIO) test strip Use one strip to check blood sugar daily 100 each 4  . lidocaine (LIDODERM) 5 % Place 1 patch onto the skin daily. Remove & Discard patch within 12 hours or as directed by MD 30 patch 0  . Linaclotide (LINZESS) 145 MCG CAPS capsule Take 1 capsule (145 mcg total) by mouth daily. (Patient taking differently: Take 145 mcg by mouth daily as needed (CONSTIPATION). ) 90 capsule 1  . losartan (COZAAR) 100 MG tablet losartan 100 mg tablet    . methocarbamol (ROBAXIN) 500 MG tablet Take 1 tablet (500 mg total) by mouth every 6 (six) hours as needed for muscle spasms. 30 tablet 2  . NONFORMULARY OR COMPOUNDED ITEM [facility]  Onychomycosis Nail Lacquer -  Fluconazole 2%, Terbinafine 1% DMSO Apply to affected nail once daily Qty. 120 gm 3 refills 1 each  2  . ondansetron (ZOFRAN) 4 MG tablet Take 1-2 tablets (4-8 mg total) by mouth every 8 (eight) hours as needed for nausea or vomiting. 40 tablet 0  . ONETOUCH DELICA LANCETS 16X MISC 1 each by Does not apply route daily. Use to help check blood sugars once daily Dx 250.00 100 each 3  . Prenatal Vit-Fe Fumarate-FA (PNV PRENATAL PLUS MULTIVITAMIN) 27-1 MG TABS TAKE 1 TABLET DAILY AT 12 NOON 60 tablet 3  . propranolol (INDERAL) 80 MG tablet TAKE 1 TABLET TWICE A DAY 180 tablet 1  . telmisartan (MICARDIS) 80 MG tablet Take 1 tablet (80 mg total) by mouth daily. 30 tablet 5   No  current facility-administered medications on file prior to visit.     ALLERGIES: Allergies  Allergen Reactions  . Sertraline Hcl Other (See Comments)    Spasms; numbness  . Ace Inhibitors Cough  . Codeine Palpitations  . Darifenacin Hydrobromide Er Other (See Comments)    Pt states made her feel queezy & drunk  . Gabapentin Other (See Comments)    Hallucinations  . Pravastatin Other (See Comments)    myalgias  . Propoxyphene Hcl Palpitations  . Wellbutrin [Bupropion Hcl] Other (See Comments)    Numbness of mouth/lips    FAMILY HISTORY: Family History  Problem Relation Age of Onset  . Diabetes Mother   . Kidney disease Father   . Prostate cancer Father   . Colitis Brother   . Leukemia Brother   . Multiple sclerosis Sister   . Colon polyps Unknown   . Breast cancer Maternal Aunt   . Breast cancer Cousin   . Colon cancer Neg Hx     SOCIAL HISTORY: Social History   Socioeconomic History  . Marital status: Married    Spouse name: Not on file  . Number of children: Not on file  . Years of education: Not on file  . Highest education level: Not on file  Occupational History  . 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 Financial resource strain: Not on file  . Food insecurity:    Worry: Not on file    Inability: Not on file  .  Transportation needs:    Medical: Not on file    Non-medical: Not on file  Tobacco Use  . Smoking status: Never Smoker  . Smokeless tobacco: Never Used  Substance and Sexual Activity  . Alcohol use: No    Comment: rare  . Drug use: No  . Sexual activity: Never  Lifestyle  . Physical activity:    Days per week: Not on file    Minutes per session: Not on file  . Stress: Not on file  Relationships  . Social connections:    Talks on phone: Not on file    Gets together: Not on file    Attends religious service: Not on file    Active member of club or organization: Not on file    Attends meetings of clubs or organizations: Not on file    Relationship status: Not on file  . Intimate partner violence:    Fear of current or ex partner: Not on file    Emotionally abused: Not on file    Physically abused: Not on file    Forced sexual activity: Not on file  Other Topics Concern  . Not on file  Social History Narrative  . Not on file    REVIEW OF SYSTEMS: Constitutional: No fevers, chills, or sweats, no generalized fatigue, change in appetite Eyes: No visual changes, double vision, eye pain Ear, nose and throat: No hearing loss, ear pain, nasal congestion, sore throat Cardiovascular: No chest pain, palpitations Respiratory:  No shortness of breath at rest or with exertion, wheezes GastrointestinaI: No nausea, vomiting, diarrhea, abdominal pain, fecal incontinence Genitourinary:  No dysuria, urinary retention or frequency Musculoskeletal:  No neck pain, back pain Integumentary: No rash, pruritus, skin lesions Neurological: as above Psychiatric: No depression, insomnia, anxiety Endocrine: No palpitations, fatigue, diaphoresis, mood swings, change in appetite, change in weight, increased thirst Hematologic/Lymphatic:  No purpura, petechiae. Allergic/Immunologic: no itchy/runny eyes, nasal congestion, recent allergic reactions, rashes  PHYSICAL EXAM: Vitals:   01/10/18 1431  BP:  92/64  Pulse: 61  SpO2: 97%   General: No acute distress.  Patient appears well-groomed.   Head:  Normocephalic/atraumatic Eyes:  Fundi examined but not visualized Neck: supple, no paraspinal tenderness, full range of motion Heart:  Regular rate and rhythm Lungs:  Clear to auscultation bilaterally Back: No paraspinal tenderness Neurological Exam: alert and oriented to person, place, and time. Attention span and concentration intact, recent and remote memory intact, fund of knowledge intact.  Speech fluent and not dysarthric, language intact.  CN II-XII intact. Bulk and tone normal, muscle strength 5/5 throughout.  Sensation to light touch intact.  Deep tendon reflexes absent throughout.  Finger to nose testing without dysmetria.  When hands outstretched there is very fine high frequency low amplitude tremor in hands, slightly more prominent in the left hand.   Gait normal, Romberg negative.  IMPRESSION: Probable postural tremor.  It doesn't appear to be medication-related based on review of her medications.  PLAN: She is already on propranolol, which may be helpful.  However, it is very mild so I would not recommend starting any new medication.  She should monitor for any changes.  Follow up as needed.  25 minutes spent face to face with patient, over 50% spent discussing management.  Janet Clines, DO  CC:  Dr. Quay Burow

## 2018-01-10 NOTE — Telephone Encounter (Signed)
MD is out of the office until 01/20/18. pls advise on refill.Marland KitchenJohny Le

## 2018-01-13 NOTE — Telephone Encounter (Signed)
Called pt no answer LMOM w/MD response. Pt will need to make ov for renewal../lmb

## 2018-01-17 ENCOUNTER — Ambulatory Visit: Payer: BLUE CROSS/BLUE SHIELD | Admitting: Neurology

## 2018-01-21 MED ORDER — LORCASERIN HCL 10 MG PO TABS: 10.0000 mg | ORAL_TABLET | Freq: Two times a day (BID) | ORAL | 0 refills | Status: DC

## 2018-01-21 MED ORDER — LIDOCAINE 5 % EX PTCH: 1.0000 | MEDICATED_PATCH | CUTANEOUS | 1 refills | Status: DC

## 2018-01-21 NOTE — Telephone Encounter (Signed)
Okay to refill both-both pending.  Not sure if she wants Lidoderm patches to be sent to [facility] as well-would need to be changed to a 90-day supply

## 2018-01-21 NOTE — Telephone Encounter (Signed)
Please advise on both refills.

## 2018-01-21 NOTE — Telephone Encounter (Signed)
Patient is requesting to ask Dr Quay Burow for refill, also is requesting refill on Lidocaine patch. Please advise. Call back [phone]

## 2018-01-21 NOTE — Addendum Note (Signed)
Addended by: Binnie Rail on: 01/21/2018 12:26 PM   Modules accepted: Orders

## 2018-01-21 NOTE — Addendum Note (Signed)
Addended by: Terence Lux B on: 01/21/2018 02:03 PM   Modules accepted: Orders

## 2018-01-21 NOTE — Telephone Encounter (Signed)
Pt would like a refill of her BELVIQ, the last time it was prescribed was 06/2017 and he would like a refill of her Lidocaine.

## 2018-01-25 ENCOUNTER — Other Ambulatory Visit: Payer: Self-pay | Admitting: Emergency Medicine

## 2018-01-25 MED ORDER — TELMISARTAN 80 MG PO TABS: 80.0000 mg | ORAL_TABLET | Freq: Every day | ORAL | 1 refills | Status: DC

## 2018-01-28 ENCOUNTER — Telehealth: Payer: Self-pay | Admitting: Emergency Medicine

## 2018-01-28 NOTE — Telephone Encounter (Signed)
Belviq PA form completed. Approved through 01/27/2019

## 2018-01-30 DIAGNOSIS — N760 Acute vaginitis: Secondary | ICD-10-CM | POA: Diagnosis not present

## 2018-01-30 DIAGNOSIS — Z6841 Body Mass Index (BMI) 40.0 and over, adult: Secondary | ICD-10-CM | POA: Diagnosis not present

## 2018-01-30 DIAGNOSIS — Z01419 Encounter for gynecological examination (general) (routine) without abnormal findings: Secondary | ICD-10-CM | POA: Diagnosis not present

## 2018-02-04 ENCOUNTER — Other Ambulatory Visit: Payer: Self-pay | Admitting: *Deleted

## 2018-02-04 MED ORDER — FUROSEMIDE 20 MG PO TABS: 20.0000 mg | ORAL_TABLET | Freq: Every day | ORAL | 1 refills | Status: DC

## 2018-02-05 ENCOUNTER — Institutional Professional Consult (permissible substitution): Payer: BLUE CROSS/BLUE SHIELD | Admitting: Pulmonary Disease

## 2018-02-07 ENCOUNTER — Telehealth: Payer: Self-pay | Admitting: Internal Medicine

## 2018-02-07 NOTE — Telephone Encounter (Signed)
Copied from [place] [phone]. Topic: Quick Communication - See Telephone Encounter >> Feb 07, 2018  4:18 PM R[place] Nail, [place] wrote: CRM for notification. See Telephone encounter for: 02/07/18. Patient calling and is requesting a letter be written because she is moving into apartments and is needing her apartment to be in a handicap apartment. Apartment office is needing a letter. CB#: [phone]

## 2018-02-07 NOTE — Telephone Encounter (Signed)
Is this ok with you  ?

## 2018-02-07 NOTE — Telephone Encounter (Signed)
Please advise 

## 2018-02-08 NOTE — Telephone Encounter (Signed)
Yes, ok to write

## 2018-02-10 DIAGNOSIS — E119 Type 2 diabetes mellitus without complications: Secondary | ICD-10-CM | POA: Diagnosis not present

## 2018-02-10 NOTE — Telephone Encounter (Signed)
LVM letting pt know a letter is ready for pick up or for her to call back to give a fax number for the letter.

## 2018-02-10 NOTE — Telephone Encounter (Signed)
Pt grandson picked up letter.

## 2018-02-11 ENCOUNTER — Ambulatory Visit: Payer: BLUE CROSS/BLUE SHIELD | Admitting: Neurology

## 2018-02-11 ENCOUNTER — Encounter

## 2018-02-13 NOTE — Patient Instructions (Addendum)
  Medications reviewed and updated.  Changes include stopping the lexapro and starting cymbalta.  Your prescription(s) have been submitted to your pharmacy. Please take as directed and contact our office if you believe you are having problem(s) with the medication(s).  A referral was ordered for ENT - Dr Constance Holster  Please followup in 6 months

## 2018-02-13 NOTE — Progress Notes (Signed)
Subjective:    Patient ID: Janet Le, female    DOB: Oct 07, 1955, 62 y.o.   MRN: [id number]  HPI The patient is here for follow up.  Prediabetes:  She is compliant with a low sugar/carbohydrate diet.  She is exercising regularly. She has lost weight.    Hypertension: She is taking her medication daily. She is compliant with a low sodium diet.  She denies chest pain, palpitations, shortness of breath and regular headaches. She is exercising regularly.  She does monitor her blood pressure at home - high normal at times but mostly average.    Hyperlipidemia: She is taking her medication daily. She is compliant with a low fat/cholesterol diet. She is exercising regularly. S   Anxiety, Depression: She is taking her medication daily as prescribed. She denies any side effects from the medication. She feels her anxiety is not well controlled.  She does have a lot of stress, but the medication does not seem to be helping.    Lymphedema b/l LE:  She is taking lasix 20 mg dialy.  She is wearing compression socks.  The swelling has improved with the weight loss.   Chronic upper back back, lower back pain, bilateral hip pain:  She has done physical therapy.  She is currently using lidocaine patches.  She has seen ortho.  She can not afford PT.    Last visit referred to pulm for eval of OSA.   Obesity:   She is taking Belviq.  She is more conscious of what and how much she is eating. She has been exercising.  She has lost weight.    Medications and allergies reviewed with patient and updated if appropriate.  Patient Active Problem List   Diagnosis Date Noted  . Fatigue 11/14/2017  . Chronic back pain 11/14/2017  . Arthralgia of hip 11/14/2017  . Memory difficulties 10/08/2017  . Anxiety and depression 10/08/2017  . Decreased sensation of foot 10/07/2017  . LVH (left ventricular hypertrophy), mild 05/18/2017  . Arthritis of knee 10/30/2016  . S/P knee surgery 10/30/2016  . Burning pain  10/15/2016  . Chronic low back pain 07/18/2016  . Prediabetes 05/30/2016  . Cervicalgia 05/30/2016  . Right knee pain 05/30/2016  . Hair loss 05/30/2016  . Osteopenia 11/18/2015  . Thrombocytopenia ([place]) 05/30/2015  . Arthritis of left lower extremity 02/02/2015  . Left knee pain 09/08/2014  . Gout 12/11/2013  . Obesity with serious comorbidity 11/06/2013  . Osteoporosis   . Left lumbar radiculopathy 04/13/2012  . Renal cell cancer ([place])   . External hemorrhoid 11/14/2010  . IBS (irritable bowel syndrome) 11/14/2010  . GERD (gastroesophageal reflux disease) 11/14/2010  . HYPERCHOLESTEROLEMIA, MILD 03/16/2010  . Lymphedema 03/15/2010  . ALLERGIC RHINITIS 08/31/2009  . MIGRAINE HEADACHE 06/14/2009  . SINUSITIS, CHRONIC 06/14/2009  . SYNCOPE 05/16/2009  . Essential hypertension 05/06/2009  . CONSTIPATION, CHRONIC 05/06/2009    Current Outpatient Medications on File Prior to Visit  Medication Sig Dispense Refill  . alendronate (FOSAMAX) 70 MG tablet TAKE 1 TABLET ONCE A WEEK WITH A FULL GLASS OF WATER ON AN EMPTY STOMACH 12 tablet 1  . AMBULATORY NON FORMULARY MEDICATION 1 Units by Other route daily. Diabetic Shoes 1 Units 0  . amLODipine (NORVASC) 5 MG tablet Take 1 tablet (5 mg total) by mouth daily. 30 tablet 0  . amoxicillin (AMOXIL) 500 MG tablet Take 2 grams 1 hour prior to dental procedure 16 tablet 0  . atorvastatin (LIPITOR) 10 MG tablet  TAKE 1 TABLET DAILY 90 tablet 1  . calcium-vitamin D (OSCAL WITH D) 500-200 MG-UNIT tablet Take 1 tablet by mouth daily.     . Diclofenac Sodium 2 % SOLN Apply 1 pump twice daily. (Patient taking differently: Apply 1 application topically 2 (two) times daily as needed (PAIN). Apply 1 pump twice daily.) 112 g 1  . escitalopram (LEXAPRO) 20 MG tablet Take 1 tablet (20 mg total) by mouth daily. 90 tablet 1  . fluticasone (FLONASE) 50 MCG/ACT nasal spray Place 1 spray into both nostrils daily. 16 g 2  . furosemide (LASIX) 20 MG tablet Take 1  tablet (20 mg total) by mouth daily. 90 tablet 1  . glucose blood (ONETOUCH VERIO) test strip Use one strip to check blood sugar daily 100 each 4  . lidocaine (LIDODERM) 5 % Place 1 patch onto the skin daily. Remove & Discard patch within 12 hours or as directed by MD 90 patch 1  . Linaclotide (LINZESS) 145 MCG CAPS capsule Take 1 capsule (145 mcg total) by mouth daily. (Patient taking differently: Take 145 mcg by mouth daily as needed (CONSTIPATION). ) 90 capsule 1  . Lorcaserin HCl 10 MG TABS Take 10 mg by mouth 2 (two) times daily. 180 tablet 0  . losartan (COZAAR) 100 MG tablet losartan 100 mg tablet    . methocarbamol (ROBAXIN) 500 MG tablet Take 1 tablet (500 mg total) by mouth every 6 (six) hours as needed for muscle spasms. 30 tablet 2  . NONFORMULARY OR COMPOUNDED ITEM [facility]  Onychomycosis Nail Lacquer -  Fluconazole 2%, Terbinafine 1% DMSO Apply to affected nail once daily Qty. 120 gm 3 refills 1 each 2  . ondansetron (ZOFRAN) 4 MG tablet Take 1-2 tablets (4-8 mg total) by mouth every 8 (eight) hours as needed for nausea or vomiting. 40 tablet 0  . ONETOUCH DELICA LANCETS 67E MISC 1 each by Does not apply route daily. Use to help check blood sugars once daily Dx 250.00 100 each 3  . Prenatal Vit-Fe Fumarate-FA (PNV PRENATAL PLUS MULTIVITAMIN) 27-1 MG TABS TAKE 1 TABLET DAILY AT 12 NOON 60 tablet 3  . propranolol (INDERAL) 80 MG tablet TAKE 1 TABLET TWICE A DAY 180 tablet 1  . telmisartan (MICARDIS) 80 MG tablet Take 1 tablet (80 mg total) by mouth daily. 90 tablet 1   No current facility-administered medications on file prior to visit.     Past Medical History:  Diagnosis Date  . Allergic rhinitis, cause unspecified   . Anxiety   . Blood dyscrasia    platelets low in past  . Chronic back pain   . Chronic constipation   . Chronic sinusitis   . Depression   . Endometriosis   . ENDOMETRIOSIS 05/06/2009   Qualifier: Diagnosis of  By: Varney Daily RN, Butch Penny    .  Esophageal stricture   . GERD (gastroesophageal reflux disease)   . Hemorrhoids   . Hiatal hernia   . Hyperlipidemia   . Hypertension   . Irritable bowel syndrome   . Osteoarthritis   . Personal history of diabetes mellitus    controlled with diet only  . Renal cell cancer ([place]) 11/2010 dx   R, s/p cryoablation 02/16/11  . Syncopal episodes    since childhood    Past Surgical History:  Procedure Laterality Date  . BREAST CYST EXCISION Bilateral over 10 years ago   No visable scar   . BREAST SURGERY     bilateral, cyst removal  .  fallopian tube removed    . FUNCTIONAL ENDOSCOPIC SINUS SURGERY    . LASER ABLATION OF THE CERVIX    . 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 TOTAL KNEE ARTHROPLASTY Right 10/30/2016   Procedure: RIGHT TOTAL KNEE ARTHROPLASTY;  Surgeon: Meredith Pel, MD;  Location: [facility];  Service: Orthopedics;  Laterality: Right;  . TUBAL LIGATION      Social History   Socioeconomic History  . Marital status: Married    Spouse name: Not on file  . Number of children: Not on file  . Years of education: Not on file  . Highest education level: Not on file  Occupational History  . 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 Financial resource strain: Not on file  . Food insecurity:    Worry: Not on file    Inability: Not on file  . Transportation needs:    Medical: Not on file    Non-medical: Not on file  Tobacco Use  . Smoking status: Never Smoker  . Smokeless tobacco: Never Used  Substance and Sexual Activity  . Alcohol use: No    Comment: rare  . Drug use: No  . Sexual activity: Never  Lifestyle  . Physical activity:    Days per week: Not on file    Minutes per session: Not on file  . Stress: Not on file  Relationships  . Social connections:    Talks on phone: Not on file    Gets together: Not on file    Attends religious service: Not on file    Active  member of club or organization: Not on file    Attends meetings of clubs or organizations: Not on file    Relationship status: Not on file  Other Topics Concern  . Not on file  Social History Narrative  . Not on file    Family History  Problem Relation Age of Onset  . Diabetes Mother   . Kidney disease Father   . Prostate cancer Father   . Colitis Brother   . Leukemia Brother   . Multiple sclerosis Sister   . Colon polyps Unknown   . Breast cancer Maternal Aunt   . Breast cancer Cousin   . Colon cancer Neg Hx     Review of Systems  Constitutional: Negative for chills and fever.  HENT: Negative for sinus pressure.   Respiratory: Positive for cough (dry). Negative for shortness of breath and wheezing.   Cardiovascular: Positive for leg swelling (chronic). Negative for chest pain and palpitations.  Neurological: Negative for light-headedness and headaches.  Psychiatric/Behavioral: Positive for dysphoric mood. The patient is nervous/anxious.        Objective:   Vitals:   02/14/18 1116  BP: 120/68  Pulse: 63  Resp: 18  Temp: 98.9 F (37.2 C)  SpO2: 97%   BP Readings from Last 3 Encounters:  02/14/18 120/68  01/10/18 92/64  11/14/17 (!) 142/80   Wt Readings from Last 3 Encounters:  02/14/18 220 lb 12.8 oz (100.2 kg)  01/10/18 222 lb (100.7 kg)  11/14/17 234 lb (106.1 kg)   Body mass index is 40.38 kg/m.   Physical Exam    Constitutional: Appears well-developed and well-nourished. No distress.  HENT:  Head: Normocephalic and atraumatic.  Neck: Neck supple. No tracheal deviation present. No thyromegaly present.  No cervical lymphadenopathy Cardiovascular: Normal rate, regular rhythm and  normal heart sounds.   No murmur heard. No carotid bruit .  Chronic lymphedema - wearing thigh high compression sock difficult to assess degree of edema but obviously swollen Pulmonary/Chest: Effort normal and breath sounds normal. No respiratory distress. No has no wheezes. No  rales.  Skin: Skin is warm and dry. Not diaphoretic.  Psychiatric: Normal mood and affect. Behavior is normal.      Assessment & Plan:    See Problem List for Assessment and Plan of chronic medical problems.

## 2018-02-14 ENCOUNTER — Ambulatory Visit: Payer: BLUE CROSS/BLUE SHIELD | Admitting: Internal Medicine

## 2018-02-14 ENCOUNTER — Encounter: Payer: Self-pay | Admitting: Internal Medicine

## 2018-02-14 VITALS — BP 120/68 | HR 63 | Temp 98.9°F | Resp 18 | Ht 62.0 in | Wt 220.8 lb

## 2018-02-14 DIAGNOSIS — F419 Anxiety disorder, unspecified: Secondary | ICD-10-CM | POA: Diagnosis not present

## 2018-02-14 DIAGNOSIS — R059 Cough, unspecified: Secondary | ICD-10-CM

## 2018-02-14 DIAGNOSIS — R05 Cough: Secondary | ICD-10-CM

## 2018-02-14 DIAGNOSIS — R7303 Prediabetes: Secondary | ICD-10-CM

## 2018-02-14 DIAGNOSIS — F329 Major depressive disorder, single episode, unspecified: Secondary | ICD-10-CM

## 2018-02-14 DIAGNOSIS — Z6841 Body Mass Index (BMI) 40.0 and over, adult: Secondary | ICD-10-CM

## 2018-02-14 DIAGNOSIS — I890 Lymphedema, not elsewhere classified: Secondary | ICD-10-CM | POA: Diagnosis not present

## 2018-02-14 DIAGNOSIS — I10 Essential (primary) hypertension: Secondary | ICD-10-CM

## 2018-02-14 DIAGNOSIS — F32A Depression, unspecified: Secondary | ICD-10-CM

## 2018-02-14 MED ORDER — DULOXETINE HCL 30 MG PO CPEP: 30.0000 mg | ORAL_CAPSULE | Freq: Every day | ORAL | 5 refills | Status: DC

## 2018-02-14 NOTE — Assessment & Plan Note (Signed)
BP well controlled Current regimen effective and well tolerated Continue current medications at current doses Continue to monitor at home Continue weight loss efforts

## 2018-02-14 NOTE — Assessment & Plan Note (Signed)
Has a cough and occasional sinus / throat issues / sensation in throat Has had sinus surgery in the past Interested in seeing ent again - referred

## 2018-02-14 NOTE — Assessment & Plan Note (Signed)
Taking belviq Has lost weight Continue with decreased portions and healthier diet Continue exercise

## 2018-02-14 NOTE — Assessment & Plan Note (Addendum)
Stop lexapro - not effective for anxiety Will try cymbalta to see if it helps with her chronic pain as well Start cymbalta 30 mg daily

## 2018-02-14 NOTE — Assessment & Plan Note (Signed)
Sugars in normal range three months ago Continue weight loss efforts and heathy diet

## 2018-02-14 NOTE — Assessment & Plan Note (Signed)
Chronic Improved  Wearing compression socks Exercising Losing weight Taking lasix 20 mg daily Continue above

## 2018-02-25 ENCOUNTER — Encounter (INDEPENDENT_AMBULATORY_CARE_PROVIDER_SITE_OTHER): Payer: BLUE CROSS/BLUE SHIELD

## 2018-02-28 DIAGNOSIS — Z9013 Acquired absence of bilateral breasts and nipples: Secondary | ICD-10-CM | POA: Diagnosis not present

## 2018-03-04 ENCOUNTER — Encounter (INDEPENDENT_AMBULATORY_CARE_PROVIDER_SITE_OTHER): Payer: Self-pay

## 2018-03-04 ENCOUNTER — Ambulatory Visit (INDEPENDENT_AMBULATORY_CARE_PROVIDER_SITE_OTHER): Payer: BLUE CROSS/BLUE SHIELD | Admitting: Bariatrics

## 2018-03-05 ENCOUNTER — Ambulatory Visit (INDEPENDENT_AMBULATORY_CARE_PROVIDER_SITE_OTHER): Payer: BLUE CROSS/BLUE SHIELD | Admitting: Bariatrics

## 2018-03-05 ENCOUNTER — Encounter (INDEPENDENT_AMBULATORY_CARE_PROVIDER_SITE_OTHER): Payer: Self-pay | Admitting: Bariatrics

## 2018-03-05 VITALS — BP 111/74 | HR 69 | Temp 97.8°F | Ht 62.0 in | Wt 218.0 lb

## 2018-03-05 DIAGNOSIS — R7303 Prediabetes: Secondary | ICD-10-CM

## 2018-03-05 DIAGNOSIS — R0602 Shortness of breath: Secondary | ICD-10-CM

## 2018-03-05 DIAGNOSIS — R5383 Other fatigue: Secondary | ICD-10-CM | POA: Diagnosis not present

## 2018-03-05 DIAGNOSIS — Z9189 Other specified personal risk factors, not elsewhere classified: Secondary | ICD-10-CM | POA: Diagnosis not present

## 2018-03-05 DIAGNOSIS — Z0289 Encounter for other administrative examinations: Secondary | ICD-10-CM

## 2018-03-05 DIAGNOSIS — E559 Vitamin D deficiency, unspecified: Secondary | ICD-10-CM | POA: Diagnosis not present

## 2018-03-05 DIAGNOSIS — Z6839 Body mass index (BMI) 39.0-39.9, adult: Secondary | ICD-10-CM

## 2018-03-05 DIAGNOSIS — F3289 Other specified depressive episodes: Secondary | ICD-10-CM | POA: Diagnosis not present

## 2018-03-05 NOTE — Progress Notes (Signed)
Office: [phone]  /  Fax: [phone]   Dear Dr. Quay Burow,   Thank you for referring Janet Le to our clinic. The following note includes my evaluation and treatment recommendations.  HPI:   Chief Complaint: OBESITY    Janet Le has been referred by Binnie Rail, MD for consultation regarding her obesity and obesity related comorbidities.    Janet Le (MR# [id number]) is a 62 y.o. female who presents on 03/05/2018 for obesity evaluation and treatment. Current BMI is Body mass index is 39.87 kg/m.[name] Janet Le has been struggling with her weight for many years and has been unsuccessful in either losing weight, maintaining weight loss, or reaching her healthy weight goal. Janet Le is taking Lorcaserin 10 mg twice daily.     Janet Le attended our information session and states she is currently in the action stage of change and ready to dedicate time achieving and maintaining a healthier weight. [name] is interested in becoming our patient and working on intensive lifestyle modifications including (but not limited to) diet, exercise and weight loss.    Janet Le states her family eats meals together she thinks her family will eat healthier with  her her desired weight loss is 71 lbs her heaviest weight ever was 258 lbs. she has significant food cravings issues  she skips meals frequently she is frequently drinking liquids with calories she frequently makes poor food choices she frequently eats larger portions than normal  she struggles with emotional eating    Fatigue Janet Le feels her energy is lower than it should be both mentally and physically. This has worsened with weight gain and has not worsened recently. Janet Le admits to daytime somnolence and  denies waking up still tired. Patient is at risk for obstructive sleep apnea. Patent has a history of symptoms of daytime fatigue. Patient generally gets 5 hours of sleep per night, and states they generally have restless sleep.  Snoring is present. Apneic episodes are not present. Epworth Sleepiness Score is 6  Dyspnea on exertion Janet Le notes increasing shortness of breath with exercising and seems to be worsening over time with weight gain. She notes getting out of breath sooner with activity than she used to. This has not gotten worse recently. Janet Le denies orthopnea.  Pre-Diabetes Janet Le has a diagnosis of prediabetes based on her elevated Hgb A1c and was informed this puts her at greater risk of developing diabetes. She is not taking metformin currently and continues to work on diet and exercise to decrease risk of diabetes. She denies polyphagia.  At risk for diabetes Janet Le is at higher than average risk for developing diabetes due to her obesity and prediabetes. She currently denies polyuria or polydipsia.  Vitamin D deficiency Janet Le has a diagnosis of vitamin D deficiency. Janet Le was previously on vitamin D, but she is not currently taking vit D and denies nausea, vomiting or muscle weakness.  Depression with emotional eating behaviors Janet Le is stress eating secondary to family issues. She reports cravings for sweets. Janet Le struggles with emotional eating and using food for comfort to the extent that it is negatively impacting her health. She often snacks when she is not hungry. Janet Le sometimes feels she is out of control and then feels guilty that she made poor food choices. She is attempting to work on behavior modification techniques to help reduce her emotional eating. She shows no sign of suicidal or homicidal ideations.  Depression Screen Janet Le's Food and Mood (modified PHQ-9) score was  Depression screen [facility] 2/9 03/05/2018  Decreased Interest 3  Down, Depressed, Hopeless 3  PHQ - 2 Score 6  Altered sleeping 3  Tired, decreased energy 3  Change in appetite 0  Feeling bad or failure about yourself  0  Trouble concentrating 3  Moving slowly or fidgety/restless 0  Suicidal thoughts 0  PHQ-9  Score 15  Difficult doing work/chores Not difficult at all  Some recent data might be hidden    ALLERGIES: Allergies  Allergen Reactions  . Sertraline Hcl Other (See Comments)    Spasms; numbness  . Ace Inhibitors Cough  . Codeine Palpitations  . Darifenacin Hydrobromide Er Other (See Comments)    Pt states made her feel queezy & drunk  . Gabapentin Other (See Comments)    Hallucinations  . Pravastatin Other (See Comments)    myalgias  . Propoxyphene Hcl Palpitations  . Wellbutrin [Bupropion Hcl] Other (See Comments)    Numbness of mouth/lips    MEDICATIONS: Current Outpatient Medications on File Prior to Visit  Medication Sig Dispense Refill  . alendronate (FOSAMAX) 70 MG tablet TAKE 1 TABLET ONCE A WEEK WITH A FULL GLASS OF WATER ON AN EMPTY STOMACH 12 tablet 1  . AMBULATORY NON FORMULARY MEDICATION 1 Units by Other route daily. Diabetic Shoes 1 Units 0  . amLODipine (NORVASC) 5 MG tablet Take 1 tablet (5 mg total) by mouth daily. 30 tablet 0  . amoxicillin (AMOXIL) 500 MG tablet Take 2 grams 1 hour prior to dental procedure 16 tablet 0  . atorvastatin (LIPITOR) 10 MG tablet TAKE 1 TABLET DAILY 90 tablet 1  . calcium-vitamin D (OSCAL WITH D) 500-200 MG-UNIT tablet Take 1 tablet by mouth daily.     . Diclofenac Sodium 2 % SOLN Apply 1 pump twice daily. (Patient taking differently: Apply 1 application topically 2 (two) times daily as needed (PAIN). Apply 1 pump twice daily.) 112 g 1  . DULoxetine (CYMBALTA) 30 MG capsule Take 1 capsule (30 mg total) by mouth daily. 30 capsule 5  . fluticasone (FLONASE) 50 MCG/ACT nasal spray Place 1 spray into both nostrils daily. 16 g 2  . furosemide (LASIX) 20 MG tablet Take 1 tablet (20 mg total) by mouth daily. 90 tablet 1  . glucose blood (ONETOUCH VERIO) test strip Use one strip to check blood sugar daily 100 each 4  . lidocaine (LIDODERM) 5 % Place 1 patch onto the skin daily. Remove & Discard patch within 12 hours or as directed by MD 90  patch 1  . Linaclotide (LINZESS) 145 MCG CAPS capsule Take 1 capsule (145 mcg total) by mouth daily. (Patient taking differently: Take 145 mcg by mouth daily as needed (CONSTIPATION). ) 90 capsule 1  . Lorcaserin HCl 10 MG TABS Take 10 mg by mouth 2 (two) times daily. 180 tablet 0  . losartan (COZAAR) 100 MG tablet losartan 100 mg tablet    . methocarbamol (ROBAXIN) 500 MG tablet Take 1 tablet (500 mg total) by mouth every 6 (six) hours as needed for muscle spasms. 30 tablet 2  . NONFORMULARY OR COMPOUNDED ITEM [facility]  Onychomycosis Nail Lacquer -  Fluconazole 2%, Terbinafine 1% DMSO Apply to affected nail once daily Qty. 120 gm 3 refills 1 each 2  . ondansetron (ZOFRAN) 4 MG tablet Take 1-2 tablets (4-8 mg total) by mouth every 8 (eight) hours as needed for nausea or vomiting. 40 tablet 0  . ONETOUCH DELICA LANCETS 97L MISC 1 each by Does not apply route daily. Use to help check blood  sugars once daily Dx 250.00 100 each 3  . Prenatal Vit-Fe Fumarate-FA (PNV PRENATAL PLUS MULTIVITAMIN) 27-1 MG TABS TAKE 1 TABLET DAILY AT 12 NOON 60 tablet 3  . propranolol (INDERAL) 80 MG tablet TAKE 1 TABLET TWICE A DAY 180 tablet 1  . telmisartan (MICARDIS) 80 MG tablet Take 1 tablet (80 mg total) by mouth daily. 90 tablet 1   No current facility-administered medications on file prior to visit.     PAST MEDICAL HISTORY: Past Medical History:  Diagnosis Date  . Allergic rhinitis, cause unspecified   . Anxiety   . Blood dyscrasia    platelets low in past  . Chronic back pain   . Chronic constipation   . Chronic sinusitis   . Depression   . Endometriosis   . ENDOMETRIOSIS 05/06/2009   Qualifier: Diagnosis of  By: Varney Daily RN, Butch Penny    . Esophageal stricture   . GERD (gastroesophageal reflux disease)   . Hemorrhoids   . Hiatal hernia   . Hyperlipidemia   . Hypertension   . Irritable bowel syndrome   . Lymphedema   . Osteoarthritis   . Personal history of diabetes mellitus     controlled with diet only  . Renal cell cancer ([place]) 11/2010 dx   R, s/p cryoablation 02/16/11  . Syncopal episodes    since childhood    PAST SURGICAL HISTORY: Past Surgical History:  Procedure Laterality Date  . BREAST CYST EXCISION Bilateral over 10 years ago   No visable scar   . BREAST SURGERY     bilateral, cyst removal  . fallopian tube removed    . FUNCTIONAL ENDOSCOPIC SINUS SURGERY    . HERNIA REPAIR    . LASER ABLATION OF THE CERVIX    . 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 TOTAL KNEE ARTHROPLASTY Right 10/30/2016   Procedure: RIGHT TOTAL KNEE ARTHROPLASTY;  Surgeon: Meredith Pel, MD;  Location: [facility];  Service: Orthopedics;  Laterality: Right;  . TUBAL LIGATION      SOCIAL HISTORY: Social History   Tobacco Use  . Smoking status: Never Smoker  . Smokeless tobacco: Never Used  Substance Use Topics  . Alcohol use: No    Comment: rare  . Drug use: No    FAMILY HISTORY: Family History  Problem Relation Age of Onset  . Diabetes Mother   . Hypertension Mother   . Hyperlipidemia Mother   . Heart disease Mother   . Stroke Mother   . Kidney disease Mother   . Thyroid disease Mother   . Sleep apnea Mother   . Obesity Mother   . Kidney disease Father   . Prostate cancer Father   . Alcoholism Father   . Colitis Brother   . Leukemia Brother   . Multiple sclerosis Sister   . Colon polyps Unknown   . Breast cancer Maternal Aunt   . Breast cancer Cousin   . Colon cancer Neg Hx     ROS: Review of Systems  Constitutional: Positive for malaise/fatigue.  HENT: Positive for tinnitus.   Eyes: Positive for redness.       Vision changes Wear Glasses or Contacts Blurry or Double Vision Floaters   Respiratory: Positive for cough and shortness of breath (on exertion).   Cardiovascular:       Leg Cramping Calf/Leg Pain with Walking  Gastrointestinal: Positive for constipation and heartburn. Negative for nausea and vomiting.    Genitourinary: Positive  for frequency.  Musculoskeletal: Positive for back pain and neck pain.       Muscle or Joint Pain Negative for muscle weakness  Skin: Positive for itching.       Dryness   Neurological: Positive for tremors, weakness and headaches.  Endo/Heme/Allergies: Negative for polydipsia.       Negative for polyphagia  Psychiatric/Behavioral: Positive for depression. Negative for suicidal ideas. The patient is nervous/anxious (nervousness) and has insomnia.        Stress    PHYSICAL EXAM: Blood pressure 111/74, pulse 69, temperature 97.8 F (36.6 C), temperature source Oral, height 5\' 2"  (1.575 m), weight 218 lb (98.9 kg), SpO2 95 %. Body mass index is 39.87 kg/m. Physical Exam  Constitutional: She is oriented to person, place, and time. She appears well-developed and well-nourished.  HENT:  Head: Normocephalic and atraumatic.  Nose: Nose normal.  Eyes: EOM are normal. No scleral icterus.  Neck: Normal range of motion. Neck supple. No thyromegaly present.  Cardiovascular: Normal rate and regular rhythm.  Pulmonary/Chest: Effort normal. No respiratory distress.  Abdominal: Soft. There is no tenderness.  + obesity  Musculoskeletal: Normal range of motion.  Range of Motion normal in all 4 extremities Uses a cane for ambulation  Neurological: She is alert and oriented to person, place, and time. Coordination normal.  Skin: Skin is warm and dry.  Psychiatric: She has a normal mood and affect. Her behavior is normal.  Vitals reviewed.   RECENT LABS AND TESTS: BMET    Component Value Date/Time   NA 140 11/15/2017 1503   NA 141 02/28/2017 1035   K 3.5 11/15/2017 1503   K 4.0 02/28/2017 1035   CL 108 11/15/2017 1503   CO2 26 11/15/2017 1503   CO2 28 02/28/2017 1035   GLUCOSE 87 11/15/2017 1503   GLUCOSE 80 02/28/2017 1035   BUN 6 11/15/2017 1503   BUN 9.0 02/28/2017 1035   CREATININE 0.80 11/15/2017 1503   CREATININE 0.9 02/28/2017 1035   CALCIUM 8.8  11/15/2017 1503   CALCIUM 10.1 02/28/2017 1035   GFRNONAA >60 10/30/2016 0853   GFRNONAA 71 05/25/2015 1508   GFRAA >60 10/30/2016 0853   GFRAA 82 05/25/2015 1508   Lab Results  Component Value Date   HGBA1C 5.2 11/15/2017   No results found for: INSULIN CBC    Component Value Date/Time   WBC 5.9 11/15/2017 1503   RBC 3.68 (L) 11/15/2017 1503   HGB 11.7 (L) 11/15/2017 1503   HGB 11.9 02/28/2017 1035   HCT 35.3 (L) 11/15/2017 1503   HCT 37.0 02/28/2017 1035   PLT 126.0 (L) 11/15/2017 1503   PLT 114 (L) 02/28/2017 1035   MCV 95.8 11/15/2017 1503   MCV 94.9 02/28/2017 1035   MCH 30.5 02/28/2017 1035   MCH 31.1 10/18/2016 1203   MCHC 33.1 11/15/2017 1503   RDW 15.8 (H) 11/15/2017 1503   RDW 15.5 (H) 02/28/2017 1035   LYMPHSABS 2.2 11/15/2017 1503   LYMPHSABS 1.8 02/28/2017 1035   MONOABS 0.4 11/15/2017 1503   MONOABS 0.5 02/28/2017 1035   EOSABS 0.1 11/15/2017 1503   EOSABS 0.1 02/28/2017 1035   BASOSABS 0.0 11/15/2017 1503   BASOSABS 0.0 02/28/2017 1035   Iron/TIBC/Ferritin/ %Sat    Component Value Date/Time   IRON 54 09/26/2010 1236   FERRITIN 45.5 09/26/2010 1236   IRONPCTSAT 15.3 (L) 09/26/2010 1236   Lipid Panel     Component Value Date/Time   CHOL 116 11/15/2017 1503   TRIG 63.0  11/15/2017 1503   TRIG 66 03/11/2009   HDL 44.40 11/15/2017 1503   CHOLHDL 3 11/15/2017 1503   VLDL 12.6 11/15/2017 1503   LDLCALC 59 11/15/2017 1503   LDLCALC 13 03/11/2009   LDLDIRECT 143.8 07/01/2012 1108   Hepatic Function Panel     Component Value Date/Time   PROT 6.6 11/15/2017 1503   PROT 7.3 02/28/2017 1035   ALBUMIN 3.7 11/15/2017 1503   ALBUMIN 3.4 (L) 02/28/2017 1035   AST 16 11/15/2017 1503   AST 18 02/28/2017 1035   ALT 15 11/15/2017 1503   ALT 17 02/28/2017 1035   ALKPHOS 81 11/15/2017 1503   ALKPHOS 92 02/28/2017 1035   BILITOT 0.6 11/15/2017 1503   BILITOT 0.27 02/28/2017 1035   BILIDIR 0.1 09/24/2016 1316   IBILI 0.3 09/24/2016 1316        Component Value Date/Time   TSH 1.37 11/15/2017 1503   TSH 1.67 10/15/2016 1227   TSH 2.62 05/30/2016 1144    ECG  shows NSR with a rate of 69 BPM INDIRECT CALORIMETER done today shows a VO2 of 153 and a REE of 1065.  Her calculated basal metabolic rate is 8127 thus her basal metabolic rate is worse than expected.    ASSESSMENT AND PLAN: Other fatigue - Plan: EKG 12-Lead  Shortness of breath on exertion  Prediabetes - Plan: Comprehensive metabolic panel, Hemoglobin A1c, Insulin, random  Vitamin D deficiency - Plan: VITAMIN D 25 Hydroxy (Vit-D Deficiency, Fractures)  Other depression - with emotional eating  At risk for diabetes mellitus  Class 2 severe obesity with serious comorbidity and body mass index (BMI) of 39.0 to 39.9 in adult, unspecified obesity type ([place])  PLAN: Fatigue Yanisa was informed that her fatigue may be related to obesity, depression or many other causes. Labs will be ordered, and in the meanwhile Markiyah has agreed to work on diet, exercise and weight loss to help with fatigue. Proper sleep hygiene was discussed including the need for 7-8 hours of quality sleep each night. A sleep study was not ordered based on symptoms and Epworth score. Indirect Calorimetry was ordered today (RMR 1065, BMR 1444 kcal). We discussed low RMR for her age and weight. She agrees to no meal skipping.  Dyspnea on exertion Rahma's shortness of breath appears to be obesity related and exercise induced. She has agreed to work on weight loss and gradually increase exercise to treat her exercise induced shortness of breath. If Dulcey follows our instructions and loses weight without improvement of her shortness of breath, we will plan to refer to pulmonology. Anael agrees to this plan. Indirect Calorimetry was ordered today (RMR 1065, BMR 1444 kcal). We discussed low RMR for her age and weight. She agrees to no meal skipping and we will monitor this condition regularly.    Pre-Diabetes Janet Le will work on weight loss and exercise. She will work on increasing protein and decreasing simple carbohydrates in her diet to help decrease the risk of diabetes. She was informed that eating too many simple carbohydrates or too many calories at one sitting increases the likelihood of GI side effects. We will check Hgb A1c and insulin levels. Janet Le agreed to follow up with [place] as directed to monitor her progress.  Diabetes risk counseling Ferris was given extended (15 minutes) diabetes prevention counseling today. She is 62 y.o. female and has risk factors for diabetes including obesity. We discussed intensive lifestyle modifications today with an emphasis on weight loss as well as increasing exercise and  decreasing simple carbohydrates in her diet.  Vitamin D Deficiency Janet Le was informed that low vitamin D levels contributes to fatigue and are associated with obesity, breast, and colon cancer. We will check vitamin D level and she will resume vitamin D supplementation if needed. She will follow up for routine testing of vitamin D, at least 2-3 times per year. She was informed of the risk of over-replacement of vitamin D and agrees to not increase her dose unless she discusses this with [place] first.  Depression with Emotional Eating Behaviors We discussed behavior modification techniques today to help Janet Le deal with her emotional eating and depression. We will refer to Dr. Mallie Mussel our bariatric psychologist. She has agreed to follow up as directed.  Depression Screen Janet Le had a strongly positive depression screening. Depression is commonly associated with obesity and often results in emotional eating behaviors. We will monitor this closely and work on CBT to help improve the non-hunger eating patterns. Referral to Psychology may be required if no improvement is seen as she continues in our clinic.  Obesity Janet Le is currently in the action stage of change and her goal is to  continue with weight loss efforts. I recommend Retal begin the structured treatment plan as follows:  She has agreed to follow the Category 1 plan Janet Le has been instructed to eventually work up to a goal of 150 minutes of combined cardio and strengthening exercise per week for weight loss and overall health benefits. We discussed the following Behavioral Modification Strategies today: increase H2O intake, keeping healthy foods in the home, increasing lean protein intake, no skipping meals, decreasing simple carbohydrates  and increasing vegetables Jameisha will do more meal planning and she agreed to stop Lorcaserin for two weeks and we may resume later.   She was informed of the importance of frequent follow up visits to maximize her success with intensive lifestyle modifications for her multiple health conditions. She was informed we would discuss her lab results at her next visit unless there is a critical issue that needs to be addressed sooner. Keyuna agreed to keep her next visit at the agreed upon time to discuss these results.    OBESITY BEHAVIORAL INTERVENTION VISIT  Today's visit was # 1   Starting weight: 218 lbs Starting date: 03/05/18 Today's weight : 218 lbs Today's date: 03/05/2018 Total lbs lost to date: 0   ASK: We discussed the diagnosis of obesity with [name] today and Loralai agreed to give [place] permission to discuss obesity behavioral modification therapy today.  ASSESS: Kayann has the diagnosis of obesity and her BMI today is 39.86 Goldy is in the action stage of change   ADVISE: Twanisha was educated on the multiple health risks of obesity as well as the benefit of weight loss to improve her health. She was advised of the need for long term treatment and the importance of lifestyle modifications to improve her current health and to decrease her risk of future health problems.  AGREE: Multiple dietary modification options and treatment options were  discussed and  Eulogia agreed to follow the recommendations documented in the above note.  ARRANGE: Tenley was educated on the importance of frequent visits to treat obesity as outlined per CMS and USPSTF guidelines and agreed to schedule her next follow up appointment today.  Corey Skains, am acting as Location manager for [employer]. Owens Shark, DO  I have reviewed the above documentation for accuracy and completeness, and I agree with the above. -Jearld Lesch, DO

## 2018-03-06 ENCOUNTER — Encounter (INDEPENDENT_AMBULATORY_CARE_PROVIDER_SITE_OTHER): Payer: Self-pay

## 2018-03-06 LAB — COMPREHENSIVE METABOLIC PANEL
ALT: 14 IU/L (ref 0–32)
AST: 18 IU/L (ref 0–40)
Albumin/Globulin Ratio: 1.5 (ref 1.2–2.2)
Albumin: 4.1 g/dL (ref 3.6–4.8)
Alkaline Phosphatase: 99 IU/L (ref 39–117)
BUN/Creatinine Ratio: 14 (ref 12–28)
BUN: 11 mg/dL (ref 8–27)
Bilirubin Total: 0.3 mg/dL (ref 0.0–1.2)
CO2: 24 mmol/L (ref 20–29)
Calcium: 9.9 mg/dL (ref 8.7–10.3)
Chloride: 102 mmol/L (ref 96–106)
Creatinine, Ser: 0.81 mg/dL (ref 0.57–1.00)
GFR calc Af Amer: 90 mL/min/{1.73_m2} (ref >59)
GFR calc non Af Amer: 78 mL/min/{1.73_m2} (ref >59)
Globulin, Total: 2.7 g/dL (ref 1.5–4.5)
Glucose: 96 mg/dL (ref 65–99)
Potassium: 3.3 mmol/L — ABNORMAL LOW (ref 3.5–5.2)
Sodium: 142 mmol/L (ref 134–144)
Total Protein: 6.8 g/dL (ref 6.0–8.5)

## 2018-03-06 LAB — HEMOGLOBIN A1C
Est. average glucose Bld gHb Est-mCnc: 111 mg/dL
Hgb A1c MFr Bld: 5.5 % (ref 4.8–5.6)

## 2018-03-06 LAB — VITAMIN D 25 HYDROXY (VIT D DEFICIENCY, FRACTURES): Vit D, 25-Hydroxy: 46.7 ng/mL (ref 30.0–100.0)

## 2018-03-06 LAB — INSULIN, RANDOM: INSULIN: 6.8 u[IU]/mL (ref 2.6–24.9)

## 2018-03-11 ENCOUNTER — Telehealth: Payer: Self-pay | Admitting: Internal Medicine

## 2018-03-11 MED ORDER — DULOXETINE HCL 60 MG PO CPEP: 60.0000 mg | ORAL_CAPSULE | Freq: Every day | ORAL | 5 refills | Status: DC

## 2018-03-11 NOTE — Telephone Encounter (Signed)
Spoke with pt to advise that she should have 2 more months of lasix left. She had a 90 day supply sent last month. She stated she would double check.
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
## 2018-03-11 NOTE — Telephone Encounter (Signed)
Pt aware of increased rx.

## 2018-03-11 NOTE — Telephone Encounter (Signed)
Ok to increase to 60 mg daily.  New rx sent to [facility]

## 2018-03-11 NOTE — Telephone Encounter (Signed)
Copied from [place] [phone]. Topic: General - Other >> Mar 11, 2018  9:08 AM Judyann Munson wrote: Reason for CRM:  patient is calling to request a increase in DULoxetine (CYMBALTA) 30 MG capsule.  Preferred Pharmacy:[facility] [place], [place] - [address] [phone] (Phone) [phone] (Fax)

## 2018-03-11 NOTE — Telephone Encounter (Signed)
Last OV was 02/14/18. Please advise or does she need an OV to discuss.

## 2018-03-11 NOTE — Telephone Encounter (Signed)
Copied from [place] [phone]. Topic: Quick Communication - Rx Refill/Question >> Mar 11, 2018  9:04 AM Judyann Munson wrote: Medication: furosemide (LASIX) 20 MG tablet  Has the patient contacted their pharmacy? No Preferred Pharmacy (with phone number or street name): [place], [place] [phone] (Phone) [phone] (Fax)    Agent: Please be advised that RX refills may take up to 3 business days. We ask that you follow-up with your pharmacy.

## 2018-03-17 ENCOUNTER — Ambulatory Visit (INDEPENDENT_AMBULATORY_CARE_PROVIDER_SITE_OTHER): Payer: BLUE CROSS/BLUE SHIELD | Admitting: Psychology

## 2018-03-17 ENCOUNTER — Encounter (INDEPENDENT_AMBULATORY_CARE_PROVIDER_SITE_OTHER): Payer: Self-pay | Admitting: Bariatrics

## 2018-03-17 ENCOUNTER — Ambulatory Visit (INDEPENDENT_AMBULATORY_CARE_PROVIDER_SITE_OTHER): Payer: BLUE CROSS/BLUE SHIELD | Admitting: Bariatrics

## 2018-03-17 VITALS — BP 129/78 | HR 58 | Temp 98.1°F | Ht 62.0 in | Wt 218.0 lb

## 2018-03-17 DIAGNOSIS — E876 Hypokalemia: Secondary | ICD-10-CM

## 2018-03-17 DIAGNOSIS — R7303 Prediabetes: Secondary | ICD-10-CM | POA: Diagnosis not present

## 2018-03-17 DIAGNOSIS — Z6839 Body mass index (BMI) 39.0-39.9, adult: Secondary | ICD-10-CM | POA: Diagnosis not present

## 2018-03-17 DIAGNOSIS — F331 Major depressive disorder, recurrent, moderate: Secondary | ICD-10-CM | POA: Diagnosis not present

## 2018-03-18 NOTE — Progress Notes (Signed)
Office: [phone]  /  Fax: [phone]   HPI:   Chief Complaint: OBESITY Janet Le is here to discuss her progress with her obesity treatment plan. She is on the  follow the Category 1 plan and is following her eating plan approximately 0 % of the time. She states she is exercising 0 minutes 0 times per week. Janet Le had been taking Belviq at her last visit but stopped as it was not working and suppressed her appetite for appropriate foods. She has not been doing Cat 1, skipping meals and not only the right things.  Her weight is 218 lb (98.9 kg) today and has maintained her weight since her last visit. She has lost 0 lbs since starting treatment with [place].  Pre-Diabetes (History) Insulin Resistance Minor Janet Le has a diagnosis of prediabetes based on her elevated HgA1c and was informed this puts her at greater risk of developing diabetes. She is not taking metformin currently and continues to work on diet and exercise to decrease risk of diabetes. She denies nausea or hypoglycemia.Currently not on any medications, pt was told in 2009 she was Prediabetic. Minimal fasting Insulin @ 6.8 and A1C 5.5   Hypokalemia (mild) Patient is on Lasix 20 mg daily with no changes in dosage. Her previous Potassium level was normal and she denies weakness or cramps.    ALLERGIES: Allergies  Allergen Reactions  . Sertraline Hcl Other (See Comments)    Spasms; numbness  . Ace Inhibitors Cough  . Codeine Palpitations  . Darifenacin Hydrobromide Er Other (See Comments)    Pt states made her feel queezy & drunk  . Gabapentin Other (See Comments)    Hallucinations  . Pravastatin Other (See Comments)    myalgias  . Propoxyphene Hcl Palpitations  . Wellbutrin [Bupropion Hcl] Other (See Comments)    Numbness of mouth/lips    MEDICATIONS: Current Outpatient Medications on File Prior to Visit  Medication Sig Dispense Refill  . alendronate (FOSAMAX) 70 MG tablet TAKE 1 TABLET ONCE A WEEK WITH A FULL GLASS  OF WATER ON AN EMPTY STOMACH 12 tablet 1  . AMBULATORY NON FORMULARY MEDICATION 1 Units by Other route daily. Diabetic Shoes 1 Units 0  . amLODipine (NORVASC) 5 MG tablet Take 1 tablet (5 mg total) by mouth daily. 30 tablet 0  . amoxicillin (AMOXIL) 500 MG tablet Take 2 grams 1 hour prior to dental procedure 16 tablet 0  . atorvastatin (LIPITOR) 10 MG tablet TAKE 1 TABLET DAILY 90 tablet 1  . calcium-vitamin D (OSCAL WITH D) 500-200 MG-UNIT tablet Take 1 tablet by mouth daily.     . Diclofenac Sodium 2 % SOLN Apply 1 pump twice daily. (Patient taking differently: Apply 1 application topically 2 (two) times daily as needed (PAIN). Apply 1 pump twice daily.) 112 g 1  . DULoxetine (CYMBALTA) 60 MG capsule Take 1 capsule (60 mg total) by mouth daily. 30 capsule 5  . fluticasone (FLONASE) 50 MCG/ACT nasal spray Place 1 spray into both nostrils daily. 16 g 2  . furosemide (LASIX) 20 MG tablet Take 1 tablet (20 mg total) by mouth daily. 90 tablet 1  . glucose blood (ONETOUCH VERIO) test strip Use one strip to check blood sugar daily 100 each 4  . lidocaine (LIDODERM) 5 % Place 1 patch onto the skin daily. Remove & Discard patch within 12 hours or as directed by MD 90 patch 1  . Linaclotide (LINZESS) 145 MCG CAPS capsule Take 1 capsule (145 mcg total) by mouth  daily. (Patient taking differently: Take 145 mcg by mouth daily as needed (CONSTIPATION). ) 90 capsule 1  . Lorcaserin HCl 10 MG TABS Take 10 mg by mouth 2 (two) times daily. 180 tablet 0  . losartan (COZAAR) 100 MG tablet losartan 100 mg tablet    . methocarbamol (ROBAXIN) 500 MG tablet Take 1 tablet (500 mg total) by mouth every 6 (six) hours as needed for muscle spasms. 30 tablet 2  . NONFORMULARY OR COMPOUNDED ITEM [facility]  Onychomycosis Nail Lacquer -  Fluconazole 2%, Terbinafine 1% DMSO Apply to affected nail once daily Qty. 120 gm 3 refills 1 each 2  . ondansetron (ZOFRAN) 4 MG tablet Take 1-2 tablets (4-8 mg total) by mouth  every 8 (eight) hours as needed for nausea or vomiting. 40 tablet 0  . ONETOUCH DELICA LANCETS 09T MISC 1 each by Does not apply route daily. Use to help check blood sugars once daily Dx 250.00 100 each 3  . Prenatal Vit-Fe Fumarate-FA (PNV PRENATAL PLUS MULTIVITAMIN) 27-1 MG TABS TAKE 1 TABLET DAILY AT 12 NOON 60 tablet 3  . propranolol (INDERAL) 80 MG tablet TAKE 1 TABLET TWICE A DAY 180 tablet 1  . telmisartan (MICARDIS) 80 MG tablet Take 1 tablet (80 mg total) by mouth daily. 90 tablet 1   No current facility-administered medications on file prior to visit.     PAST MEDICAL HISTORY: Past Medical History:  Diagnosis Date  . Allergic rhinitis, cause unspecified   . Anxiety   . Blood dyscrasia    platelets low in past  . Chronic back pain   . Chronic constipation   . Chronic sinusitis   . Depression   . Endometriosis   . ENDOMETRIOSIS 05/06/2009   Qualifier: Diagnosis of  By: Varney Daily RN, Butch Penny    . Esophageal stricture   . GERD (gastroesophageal reflux disease)   . Hemorrhoids   . Hiatal hernia   . Hyperlipidemia   . Hypertension   . Irritable bowel syndrome   . Lymphedema   . Osteoarthritis   . Personal history of diabetes mellitus    controlled with diet only  . Renal cell cancer ([place]) 11/2010 dx   R, s/p cryoablation 02/16/11  . Syncopal episodes    since childhood    PAST SURGICAL HISTORY: Past Surgical History:  Procedure Laterality Date  . BREAST CYST EXCISION Bilateral over 10 years ago   No visable scar   . BREAST SURGERY     bilateral, cyst removal  . fallopian tube removed    . FUNCTIONAL ENDOSCOPIC SINUS SURGERY    . HERNIA REPAIR    . LASER ABLATION OF THE CERVIX    . 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 TOTAL KNEE ARTHROPLASTY Right 10/30/2016   Procedure: RIGHT TOTAL KNEE ARTHROPLASTY;  Surgeon: Meredith Pel, MD;  Location: [facility];  Service: Orthopedics;  Laterality: Right;  . TUBAL LIGATION      SOCIAL  HISTORY: Social History   Tobacco Use  . Smoking status: Never Smoker  . Smokeless tobacco: Never Used  Substance Use Topics  . Alcohol use: No    Comment: rare  . Drug use: No    FAMILY HISTORY: Family History  Problem Relation Age of Onset  . Diabetes Mother   . Hypertension Mother   . Hyperlipidemia Mother   . Heart disease Mother   . Stroke Mother   . Kidney disease Mother   .  Thyroid disease Mother   . Sleep apnea Mother   . Obesity Mother   . Kidney disease Father   . Prostate cancer Father   . Alcoholism Father   . Colitis Brother   . Leukemia Brother   . Multiple sclerosis Sister   . Colon polyps Unknown   . Breast cancer Maternal Aunt   . Breast cancer Cousin   . Colon cancer Neg Hx     ROS: Review of Systems  All other systems reviewed and are negative.   PHYSICAL EXAM: Blood pressure 129/78, pulse (!) 58, temperature 98.1 F (36.7 C), temperature source Oral, height 5\' 2"  (1.575 m), weight 218 lb (98.9 kg), SpO2 99 %. Body mass index is 39.87 kg/m. Physical Exam  Constitutional: She is oriented to person, place, and time. She appears well-developed and well-nourished.  HENT:  Head: Normocephalic.  Eyes: EOM are normal.  Neck: Normal range of motion.  Pulmonary/Chest: Effort normal.  Musculoskeletal: Normal range of motion.  Neurological: She is alert and oriented to person, place, and time.  Skin: Skin is warm and dry.  Psychiatric: She has a normal mood and affect. Her behavior is normal.    RECENT LABS AND TESTS: BMET    Component Value Date/Time   NA 142 03/05/2018 1040   NA 141 02/28/2017 1035   K 3.3 (L) 03/05/2018 1040   K 4.0 02/28/2017 1035   CL 102 03/05/2018 1040   CO2 24 03/05/2018 1040   CO2 28 02/28/2017 1035   GLUCOSE 96 03/05/2018 1040   GLUCOSE 87 11/15/2017 1503   GLUCOSE 80 02/28/2017 1035   BUN 11 03/05/2018 1040   BUN 9.0 02/28/2017 1035   CREATININE 0.81 03/05/2018 1040   CREATININE 0.9 02/28/2017 1035    CALCIUM 9.9 03/05/2018 1040   CALCIUM 10.1 02/28/2017 1035   GFRNONAA 78 03/05/2018 1040   GFRNONAA 71 05/25/2015 1508   GFRAA 90 03/05/2018 1040   GFRAA 82 05/25/2015 1508   Lab Results  Component Value Date   HGBA1C 5.5 03/05/2018   HGBA1C 5.2 11/15/2017   HGBA1C 5.6 04/05/2017   HGBA1C 5.5 10/15/2016   HGBA1C 5.3 05/30/2016   Lab Results  Component Value Date   INSULIN 6.8 03/05/2018   CBC    Component Value Date/Time   WBC 5.9 11/15/2017 1503   RBC 3.68 (L) 11/15/2017 1503   HGB 11.7 (L) 11/15/2017 1503   HGB 11.9 02/28/2017 1035   HCT 35.3 (L) 11/15/2017 1503   HCT 37.0 02/28/2017 1035   PLT 126.0 (L) 11/15/2017 1503   PLT 114 (L) 02/28/2017 1035   MCV 95.8 11/15/2017 1503   MCV 94.9 02/28/2017 1035   MCH 30.5 02/28/2017 1035   MCH 31.1 10/18/2016 1203   MCHC 33.1 11/15/2017 1503   RDW 15.8 (H) 11/15/2017 1503   RDW 15.5 (H) 02/28/2017 1035   LYMPHSABS 2.2 11/15/2017 1503   LYMPHSABS 1.8 02/28/2017 1035   MONOABS 0.4 11/15/2017 1503   MONOABS 0.5 02/28/2017 1035   EOSABS 0.1 11/15/2017 1503   EOSABS 0.1 02/28/2017 1035   BASOSABS 0.0 11/15/2017 1503   BASOSABS 0.0 02/28/2017 1035   Iron/TIBC/Ferritin/ %Sat    Component Value Date/Time   IRON 54 09/26/2010 1236   FERRITIN 45.5 09/26/2010 1236   IRONPCTSAT 15.3 (L) 09/26/2010 1236   Lipid Panel     Component Value Date/Time   CHOL 116 11/15/2017 1503   TRIG 63.0 11/15/2017 1503   TRIG 66 03/11/2009   HDL 44.40 11/15/2017 1503  CHOLHDL 3 11/15/2017 1503   VLDL 12.6 11/15/2017 1503   LDLCALC 59 11/15/2017 1503   LDLCALC 13 03/11/2009   LDLDIRECT 143.8 07/01/2012 1108   Hepatic Function Panel     Component Value Date/Time   PROT 6.8 03/05/2018 1040   PROT 7.3 02/28/2017 1035   ALBUMIN 4.1 03/05/2018 1040   ALBUMIN 3.4 (L) 02/28/2017 1035   AST 18 03/05/2018 1040   AST 18 02/28/2017 1035   ALT 14 03/05/2018 1040   ALT 17 02/28/2017 1035   ALKPHOS 99 03/05/2018 1040   ALKPHOS 92 02/28/2017  1035   BILITOT 0.3 03/05/2018 1040   BILITOT 0.27 02/28/2017 1035   BILIDIR 0.1 09/24/2016 1316   IBILI 0.3 09/24/2016 1316      Component Value Date/Time   TSH 1.37 11/15/2017 1503   TSH 1.67 10/15/2016 1227   TSH 2.62 05/30/2016 1144    ASSESSMENT AND PLAN: Prediabetes  Hypokalemia  Class 2 severe obesity with serious comorbidity and body mass index (BMI) of 39.0 to 39.9 in adult, unspecified obesity type ([place])  PLAN: Pre-Diabetes Janet Le will continue to work on weight loss, exercise, and decreasing simple carbohydrates in her diet to help decrease the risk of diabetes. We dicussed metformin including benefits and risks. She was informed that eating too many simple carbohydrates or too many calories at one sitting increases the likelihood of GI side effects. Janet Le declined metformin for now and a prescription was not written today. Will not begin any medications at this time, will work on diet with increasing protein and decreasing her carbs.  Janet Le agreed to follow up with [place] as directed to monitor her progress.  Hypokalemia Will increase her potassium sources (dietician to discuss) will recheck in the future. Will recheck that level at her next visit and follow up.   Obesity Janet Le is not currently in the action stage of change. As such, her goal is to continue with weight loss efforts She has agreed to follow the Category 1 plan Janet Le has been instructed to work up to a goal of 150 minutes of combined cardio and strengthening exercise per week for weight loss and overall health benefits. We discussed the following Behavioral Modification Stratagies today: increasing lean protein intake, decreasing simple carbohydrates , increasing vegetables, decreasing sodium intake and ways to avoid night time snacking, increase H20 intake and no skipping meals.     Janet Le has agreed to follow up with our clinic in 2 weeks. She was informed of the importance of frequent follow up visits  to maximize her success with intensive lifestyle modifications for her multiple health conditions.   OBESITY BEHAVIORAL INTERVENTION VISIT  Today's visit was # 2   Starting weight: 218 lb Starting date: 03/05/18 Today's weight : Weight: 218 lb (98.9 kg)  Today's date: 03/17/18 Total lbs lost to date: 0 At least 15 minutes were spent on discussing the following behavioral intervention visit.   ASK: We discussed the diagnosis of obesity with Janet Le today and Janet Le agreed to give [place] permission to discuss obesity behavioral modification therapy today.  ASSESS: Janet Le has the diagnosis of obesity and her BMI today is @TBMI @ Janet Le is not in the action stage of change   ADVISE: Janet Le was educated on the multiple health risks of obesity as well as the benefit of weight loss to improve her health. She was advised of the need for long term treatment and the importance of lifestyle modifications to improve her current health and to decrease her  risk of future health problems.  AGREE: Multiple dietary modification options and treatment options were discussed and  Janet Le agreed to follow the recommendations documented in the above note.  ARRANGE: Janet Le was educated on the importance of frequent visits to treat obesity as outlined per CMS and USPSTF guidelines and agreed to schedule her next follow up appointment today.  I, April Moore, am acting as transcriptionist for Dr Jearld Lesch   I have reviewed the above documentation for accuracy and completeness, and I agree with the above. -Jearld Lesch, DO

## 2018-03-20 ENCOUNTER — Ambulatory Visit (INDEPENDENT_AMBULATORY_CARE_PROVIDER_SITE_OTHER): Payer: BLUE CROSS/BLUE SHIELD | Admitting: Bariatrics

## 2018-03-20 ENCOUNTER — Encounter: Payer: Self-pay | Admitting: Internal Medicine

## 2018-03-27 ENCOUNTER — Encounter: Payer: Self-pay | Admitting: Pulmonary Disease

## 2018-03-27 ENCOUNTER — Ambulatory Visit (INDEPENDENT_AMBULATORY_CARE_PROVIDER_SITE_OTHER): Payer: BLUE CROSS/BLUE SHIELD | Admitting: Pulmonary Disease

## 2018-03-27 ENCOUNTER — Ambulatory Visit (INDEPENDENT_AMBULATORY_CARE_PROVIDER_SITE_OTHER): Payer: BLUE CROSS/BLUE SHIELD

## 2018-03-27 VITALS — BP 112/72 | HR 78 | Ht 62.0 in | Wt 221.0 lb

## 2018-03-27 DIAGNOSIS — G4719 Other hypersomnia: Secondary | ICD-10-CM

## 2018-03-27 DIAGNOSIS — Z23 Encounter for immunization: Secondary | ICD-10-CM | POA: Diagnosis not present

## 2018-03-27 DIAGNOSIS — G4733 Obstructive sleep apnea (adult) (pediatric): Secondary | ICD-10-CM

## 2018-03-27 DIAGNOSIS — Z87898 Personal history of other specified conditions: Secondary | ICD-10-CM

## 2018-03-27 NOTE — Progress Notes (Signed)
Janet Le    [id number]    February 13, 1956  Primary Care Physician:Burns, Claudina Lick, MD  Referring Physician: Binnie Rail, MD [place], [place] [address]  Chief complaint:   History of obstructive sleep apnea Recurrence of symptoms of daytime sleepiness, insomnia  HPI:  Diagnoses obstructive sleep apnea in the past, did use CPAP in the past She did have repeat studies that were negative She is having recurrence of symptoms Memory issues, significant snoring  Nonrestorative sleep, tiredness, insomnia  Usually tries to go to bed by 11 PM, wakes up a few times during the night He tries to get up in the morning about 10 AM She will start feeling sleepy probably about 2 PM Sleep is felt to be nonrestorative  Occasional dry mouth Occasional headaches Multiple family members with obstructive sleep apnea  Smoking history: Non-smoker  Outpatient Encounter Medications as of 03/27/2018  Medication Sig  . alendronate (FOSAMAX) 70 MG tablet TAKE 1 TABLET ONCE A WEEK WITH A FULL GLASS OF WATER ON AN EMPTY STOMACH  . AMBULATORY NON FORMULARY MEDICATION 1 Units by Other route daily. Diabetic Shoes  . amLODipine (NORVASC) 5 MG tablet Take 1 tablet (5 mg total) by mouth daily.  [name] amoxicillin (AMOXIL) 500 MG tablet Take 2 grams 1 hour prior to dental procedure  . atorvastatin (LIPITOR) 10 MG tablet TAKE 1 TABLET DAILY  . calcium-vitamin D (OSCAL WITH D) 500-200 MG-UNIT tablet Take 1 tablet by mouth daily.   . Diclofenac Sodium 2 % SOLN Apply 1 pump twice daily. (Patient taking differently: Apply 1 application topically 2 (two) times daily as needed (PAIN). Apply 1 pump twice daily.)  . DULoxetine (CYMBALTA) 60 MG capsule Take 1 capsule (60 mg total) by mouth daily.  . fluticasone (FLONASE) 50 MCG/ACT nasal spray Place 1 spray into both nostrils daily.  . furosemide (LASIX) 20 MG tablet Take 1 tablet (20 mg total) by mouth daily.  [name] glucose blood (ONETOUCH VERIO) test  strip Use one strip to check blood sugar daily  . lidocaine (LIDODERM) 5 % Place 1 patch onto the skin daily. Remove & Discard patch within 12 hours or as directed by MD  . Linaclotide (LINZESS) 145 MCG CAPS capsule Take 1 capsule (145 mcg total) by mouth daily. (Patient taking differently: Take 145 mcg by mouth daily as needed (CONSTIPATION). )  . Lorcaserin HCl 10 MG TABS Take 10 mg by mouth 2 (two) times daily.  [name] losartan (COZAAR) 100 MG tablet losartan 100 mg tablet  . methocarbamol (ROBAXIN) 500 MG tablet Take 1 tablet (500 mg total) by mouth every 6 (six) hours as needed for muscle spasms.  . NONFORMULARY OR COMPOUNDED ITEM [facility]  Onychomycosis Nail Lacquer -  Fluconazole 2%, Terbinafine 1% DMSO Apply to affected nail once daily Qty. 120 gm 3 refills  . ondansetron (ZOFRAN) 4 MG tablet Take 1-2 tablets (4-8 mg total) by mouth every 8 (eight) hours as needed for nausea or vomiting.  Glory Rosebush DELICA LANCETS 54Y MISC 1 each by Does not apply route daily. Use to help check blood sugars once daily Dx 250.00  . Prenatal Vit-Fe Fumarate-FA (PNV PRENATAL PLUS MULTIVITAMIN) 27-1 MG TABS TAKE 1 TABLET DAILY AT 12 NOON  . propranolol (INDERAL) 80 MG tablet TAKE 1 TABLET TWICE A DAY  . telmisartan (MICARDIS) 80 MG tablet Take 1 tablet (80 mg total) by mouth daily.   No facility-administered encounter medications on file  as of 03/27/2018.     Allergies as of 03/27/2018 - Review Complete 03/27/2018  Allergen Reaction Noted  . Sertraline hcl Other (See Comments) 04/11/2011  . Ace inhibitors Cough 07/30/2012  . Codeine Palpitations   . Darifenacin hydrobromide er Other (See Comments) 05/14/2011  . Gabapentin Other (See Comments) 10/23/2011  . Pravastatin Other (See Comments) 01/06/2014  . Propoxyphene hcl Palpitations   . Wellbutrin [bupropion hcl] Other (See Comments) 01/10/2011    Past Medical History:  Diagnosis Date  . Allergic rhinitis, cause unspecified   . Anxiety   .  Blood dyscrasia    platelets low in past  . Chronic back pain   . Chronic constipation   . Chronic sinusitis   . Depression   . Endometriosis   . ENDOMETRIOSIS 05/06/2009   Qualifier: Diagnosis of  By: Varney Daily RN, Butch Penny    . Esophageal stricture   . GERD (gastroesophageal reflux disease)   . Hemorrhoids   . Hiatal hernia   . Hyperlipidemia   . Hypertension   . Irritable bowel syndrome   . Lymphedema   . Osteoarthritis   . Personal history of diabetes mellitus    controlled with diet only  . Renal cell cancer ([place]) 11/2010 dx   R, s/p cryoablation 02/16/11  . Syncopal episodes    since childhood    Past Surgical History:  Procedure Laterality Date  . BREAST CYST EXCISION Bilateral over 10 years ago   No visable scar   . BREAST SURGERY     bilateral, cyst removal  . fallopian tube removed    . FUNCTIONAL ENDOSCOPIC SINUS SURGERY    . HERNIA REPAIR    . LASER ABLATION OF THE CERVIX    . 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 TOTAL KNEE ARTHROPLASTY Right 10/30/2016   Procedure: RIGHT TOTAL KNEE ARTHROPLASTY;  Surgeon: Meredith Pel, MD;  Location: [facility];  Service: Orthopedics;  Laterality: Right;  . TUBAL LIGATION      Family History  Problem Relation Age of Onset  . Diabetes Mother   . Hypertension Mother   . Hyperlipidemia Mother   . Heart disease Mother   . Stroke Mother   . Kidney disease Mother   . Thyroid disease Mother   . Sleep apnea Mother   . Obesity Mother   . Kidney disease Father   . Prostate cancer Father   . Alcoholism Father   . Colitis Brother   . Leukemia Brother   . Multiple sclerosis Sister   . Colon polyps Unknown   . Breast cancer Maternal Aunt   . Breast cancer Cousin   . Colon cancer Neg Hx     Social History   Socioeconomic History  . Marital status: Legally Separated    Spouse name: Not on file  . Number of children: Not on file  . Years of education: Not on file  . Highest education level:  Not on file  Occupational History  . Occupation: Disabled  Social Needs  . Financial resource strain: Not on file  . Food insecurity:    Worry: Not on file    Inability: Not on file  . Transportation needs:    Medical: Not on file    Non-medical: Not on file  Tobacco Use  . Smoking status: Never Smoker  . Smokeless tobacco: Never Used  Substance and Sexual Activity  . Alcohol use: No    Comment: rare  . Drug  use: No  . Sexual activity: Never  Lifestyle  . Physical activity:    Days per week: Not on file    Minutes per session: Not on file  . Stress: Not on file  Relationships  . Social connections:    Talks on phone: Not on file    Gets together: Not on file    Attends religious service: Not on file    Active member of club or organization: Not on file    Attends meetings of clubs or organizations: Not on file    Relationship status: Not on file  . Intimate partner violence:    Fear of current or ex partner: Not on file    Emotionally abused: Not on file    Physically abused: Not on file    Forced sexual activity: Not on file  Other Topics Concern  . Not on file  Social History Narrative  . Not on file    Review of Systems  Constitutional: Positive for activity change.  Respiratory: Positive for apnea.   Cardiovascular: Positive for leg swelling. Negative for chest pain.  Gastrointestinal: Negative.   Genitourinary: Negative.   Musculoskeletal: Positive for arthralgias.  Hematological: Negative.   Psychiatric/Behavioral: Positive for sleep disturbance.    Vitals:   03/27/18 1030  BP: 112/72  Pulse: 78  SpO2: 98%     Physical Exam  Constitutional: She is oriented to person, place, and time. She appears well-developed and well-nourished.  HENT:  Head: Normocephalic and atraumatic.  Crowded oropharynx, Mallampati 4  Eyes: Pupils are equal, round, and reactive to light. Conjunctivae and EOM are normal. Right eye exhibits no discharge. Left eye exhibits  no discharge.  Neck: Normal range of motion. Neck supple. No tracheal deviation present. No thyromegaly present.  Cardiovascular: Normal rate and regular rhythm.  Pulmonary/Chest: Effort normal and breath sounds normal. No respiratory distress. She has no wheezes.  Abdominal: Soft. Bowel sounds are normal. She exhibits no distension. There is no tenderness.  Musculoskeletal: Normal range of motion. She exhibits edema. She exhibits no deformity.  Neurological: She is alert and oriented to person, place, and time. She has normal reflexes. No cranial nerve deficit. Coordination normal.  Skin: Skin is warm and dry. No erythema.  Psychiatric: She has a normal mood and affect.   Data Reviewed: Records reviewed  Assessment:  Moderate probability of significant sleep disordered breathing  Nonrestorative sleep  Excessive daytime sleepiness    Plan/Recommendations:  We will order an in lab study-patient has had multiple studies, most recent one being negative She has had significant symptoms suggesting presence of sleep disordered breathing  Pathophysiology of sleep disordered breathing discussed  treatment of sleep disordered breathing discussed  Importance of weight loss and physical activity discussed  I will see her back in the office in about 3 months     Sherrilyn Rist MD [facility] 03/27/2018, 10:58 AM  CC: Binnie Rail, MD

## 2018-03-27 NOTE — Patient Instructions (Signed)
Moderate probability of significant sleep disordered breathing  Multiple studies in the past some negative, some positive with recurrence of symptoms at present  She will benefit from an in lab study  I will see you back in the office in about 2 to 3 months   Sleep Apnea Sleep apnea is a condition that affects breathing. People with sleep apnea have moments during sleep when their breathing pauses briefly or gets shallow. Sleep apnea can cause these symptoms:  Trouble staying asleep.  Sleepiness or tiredness during the day.  Irritability.  Loud snoring.  Morning headaches.  Trouble concentrating.  Forgetting things.  Less interest in sex.  Being sleepy for no reason.  Mood swings.  Personality changes.  Depression.  Waking up a lot during the night to pee (urinate).  Dry mouth.  Sore throat.  Follow these instructions at home:  Make any changes in your routine that your doctor recommends.  Eat a healthy, well-balanced diet.  Take over-the-counter and prescription medicines only as told by your doctor.  Avoid using alcohol, calming medicines (sedatives), and narcotic medicines.  Take steps to lose weight if you are overweight.  If you were given a machine (device) to use while you sleep, use it only as told by your doctor.  Do not use any tobacco products, such as cigarettes, chewing tobacco, and e-cigarettes. If you need help quitting, ask your doctor.  Keep all follow-up visits as told by your doctor. This is important. Contact a doctor if:  The machine that you were given to use during sleep is uncomfortable or does not seem to be working.  Your symptoms do not get better.  Your symptoms get worse. Get help right away if:  Your chest hurts.  You have trouble breathing in enough air (shortness of breath).  You have an uncomfortable feeling in your back, arms, or stomach.  You have trouble talking.  One side of your body feels weak.  A  part of your face is hanging down (drooping). These symptoms may be an emergency. Do not wait to see if the symptoms will go away. Get medical help right away. Call your local emergency services (911 in the [place] Do not drive yourself to the hospital. This information is not intended to replace advice given to you by your health care provider. Make sure you discuss any questions you have with your health care provider. Document Released: 03/20/2008 Document Revised: 02/05/2016 Document Reviewed: 03/21/2015 Elsevier Interactive Patient Education  [employer].

## 2018-03-31 ENCOUNTER — Ambulatory Visit (INDEPENDENT_AMBULATORY_CARE_PROVIDER_SITE_OTHER): Payer: BLUE CROSS/BLUE SHIELD | Admitting: Bariatrics

## 2018-03-31 VITALS — BP 127/79 | HR 68 | Temp 97.7°F | Ht 62.0 in | Wt 215.0 lb

## 2018-03-31 DIAGNOSIS — I10 Essential (primary) hypertension: Secondary | ICD-10-CM | POA: Diagnosis not present

## 2018-03-31 DIAGNOSIS — E876 Hypokalemia: Secondary | ICD-10-CM

## 2018-03-31 DIAGNOSIS — Z9189 Other specified personal risk factors, not elsewhere classified: Secondary | ICD-10-CM

## 2018-03-31 DIAGNOSIS — E8881 Metabolic syndrome: Secondary | ICD-10-CM | POA: Diagnosis not present

## 2018-03-31 DIAGNOSIS — Z6839 Body mass index (BMI) 39.0-39.9, adult: Secondary | ICD-10-CM

## 2018-04-01 ENCOUNTER — Telehealth: Payer: Self-pay | Admitting: Internal Medicine

## 2018-04-01 LAB — POTASSIUM: Potassium: 3.6 mmol/L (ref 3.5–5.2)

## 2018-04-01 MED ORDER — PROPRANOLOL HCL 80 MG PO TABS: 80.0000 mg | ORAL_TABLET | Freq: Two times a day (BID) | ORAL | 0 refills | Status: DC

## 2018-04-01 NOTE — Telephone Encounter (Signed)
Pt is requesting a 2 week supply of her BP medication propranolol (INDERAL) 80 MG tablet until she receive her complete order from mail order.   [place], [place] - [address] [phone] (Phone) [phone] (Fax)

## 2018-04-01 NOTE — Progress Notes (Signed)
Office: [phone]  /  Fax: [phone]   HPI:   Chief Complaint: OBESITY Janet Le is here to discuss her progress with her obesity treatment plan. She is on the Category 1 plan and is following her eating plan approximately 20 % of the time. She states she is exercising 0 minutes 0 times per week. Janet Le is taking Lorcaserin 10mg  BID (which was started per another provider). She has had no or a decreased appetite due to increased stress (from the death of her mother).  Her weight is 215 lb (97.5 kg) today and has had a weight loss of 3 pounds over a period of 2 weeks since her last visit. She has lost 3 lbs since starting treatment with [place].  Insulin Resistance Janet Le has a diagnosis of insulin resistance based on her elevated fasting insulin level >5. Although Janet Le's blood glucose readings are still under good control, insulin resistance puts her at greater risk of metabolic syndrome and diabetes. Her last A1c was 5.5 and fasting Insulin was 6.8 on 03/05/18. She is not taking medications currently and continues to work on diet and exercise to decrease risk of diabetes.  At risk for diabetes Janet Le is at higher than average risk for developing diabetes due to her insulin resistance and obesity.   Mild Hypokalemia She is taking Lasix 20mg  and there have been no changes in dosage.  Hypertension Janet Le is a 62 y.o. female with hypertension. She is working on weight loss to help control her blood pressure with the goal of decreasing her risk of heart attack and stroke. She is taking Norvasc 5mg  and losartan 100mg . Janet Le's blood pressure is currently well controlled. Janet Le denies chest pain or lightheadedness.  ALLERGIES: Allergies  Allergen Reactions  . Sertraline Hcl Other (See Comments)    Spasms; numbness  . Ace Inhibitors Cough  . Codeine Palpitations  . Darifenacin Hydrobromide Er Other (See Comments)    Pt states made her feel queezy & drunk  . Gabapentin Other (See  Comments)    Hallucinations  . Pravastatin Other (See Comments)    myalgias  . Propoxyphene Hcl Palpitations  . Wellbutrin [Bupropion Hcl] Other (See Comments)    Numbness of mouth/lips    MEDICATIONS: Current Outpatient Medications on File Prior to Visit  Medication Sig Dispense Refill  . alendronate (FOSAMAX) 70 MG tablet TAKE 1 TABLET ONCE A WEEK WITH A FULL GLASS OF WATER ON AN EMPTY STOMACH 12 tablet 1  . AMBULATORY NON FORMULARY MEDICATION 1 Units by Other route daily. Diabetic Shoes 1 Units 0  . amLODipine (NORVASC) 5 MG tablet Take 1 tablet (5 mg total) by mouth daily. 30 tablet 0  . amoxicillin (AMOXIL) 500 MG tablet Take 2 grams 1 hour prior to dental procedure 16 tablet 0  . atorvastatin (LIPITOR) 10 MG tablet TAKE 1 TABLET DAILY 90 tablet 1  . calcium-vitamin D (OSCAL WITH D) 500-200 MG-UNIT tablet Take 1 tablet by mouth daily.     . Diclofenac Sodium 2 % SOLN Apply 1 pump twice daily. (Patient taking differently: Apply 1 application topically 2 (two) times daily as needed (PAIN). Apply 1 pump twice daily.) 112 g 1  . DULoxetine (CYMBALTA) 60 MG capsule Take 1 capsule (60 mg total) by mouth daily. 30 capsule 5  . fluticasone (FLONASE) 50 MCG/ACT nasal spray Place 1 spray into both nostrils daily. 16 g 2  . furosemide (LASIX) 20 MG tablet Take 1 tablet (20 mg total) by mouth daily. 90 tablet 1  .  glucose blood (ONETOUCH VERIO) test strip Use one strip to check blood sugar daily 100 each 4  . lidocaine (LIDODERM) 5 % Place 1 patch onto the skin daily. Remove & Discard patch within 12 hours or as directed by MD 90 patch 1  . Linaclotide (LINZESS) 145 MCG CAPS capsule Take 1 capsule (145 mcg total) by mouth daily. (Patient taking differently: Take 145 mcg by mouth daily as needed (CONSTIPATION). ) 90 capsule 1  . Lorcaserin HCl 10 MG TABS Take 10 mg by mouth 2 (two) times daily. 180 tablet 0  . losartan (COZAAR) 100 MG tablet losartan 100 mg tablet    . methocarbamol (ROBAXIN) 500  MG tablet Take 1 tablet (500 mg total) by mouth every 6 (six) hours as needed for muscle spasms. 30 tablet 2  . NONFORMULARY OR COMPOUNDED ITEM [facility]  Onychomycosis Nail Lacquer -  Fluconazole 2%, Terbinafine 1% DMSO Apply to affected nail once daily Qty. 120 gm 3 refills 1 each 2  . ondansetron (ZOFRAN) 4 MG tablet Take 1-2 tablets (4-8 mg total) by mouth every 8 (eight) hours as needed for nausea or vomiting. 40 tablet 0  . ONETOUCH DELICA LANCETS 22G MISC 1 each by Does not apply route daily. Use to help check blood sugars once daily Dx 250.00 100 each 3  . Prenatal Vit-Fe Fumarate-FA (PNV PRENATAL PLUS MULTIVITAMIN) 27-1 MG TABS TAKE 1 TABLET DAILY AT 12 NOON 60 tablet 3  . propranolol (INDERAL) 80 MG tablet TAKE 1 TABLET TWICE A DAY 180 tablet 1  . telmisartan (MICARDIS) 80 MG tablet Take 1 tablet (80 mg total) by mouth daily. 90 tablet 1   No current facility-administered medications on file prior to visit.     PAST MEDICAL HISTORY: Past Medical History:  Diagnosis Date  . Allergic rhinitis, cause unspecified   . Anxiety   . Blood dyscrasia    platelets low in past  . Chronic back pain   . Chronic constipation   . Chronic sinusitis   . Depression   . Endometriosis   . ENDOMETRIOSIS 05/06/2009   Qualifier: Diagnosis of  By: Varney Daily RN, Butch Penny    . Esophageal stricture   . GERD (gastroesophageal reflux disease)   . Hemorrhoids   . Hiatal hernia   . Hyperlipidemia   . Hypertension   . Irritable bowel syndrome   . Lymphedema   . Osteoarthritis   . Personal history of diabetes mellitus    controlled with diet only  . Renal cell cancer ([place]) 11/2010 dx   R, s/p cryoablation 02/16/11  . Syncopal episodes    since childhood    PAST SURGICAL HISTORY: Past Surgical History:  Procedure Laterality Date  . BREAST CYST EXCISION Bilateral over 10 years ago   No visable scar   . BREAST SURGERY     bilateral, cyst removal  . fallopian tube removed    . FUNCTIONAL  ENDOSCOPIC SINUS SURGERY    . HERNIA REPAIR    . LASER ABLATION OF THE CERVIX    . 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 TOTAL KNEE ARTHROPLASTY Right 10/30/2016   Procedure: RIGHT TOTAL KNEE ARTHROPLASTY;  Surgeon: Meredith Pel, MD;  Location: [place] [facility];  Service: Orthopedics;  Laterality: Right;  . TUBAL LIGATION      SOCIAL HISTORY: Social History   Tobacco Use  . Smoking status: Never Smoker  . Smokeless tobacco: Never Used  Substance Use Topics  .  Alcohol use: No    Comment: rare  . Drug use: No    FAMILY HISTORY: Family History  Problem Relation Age of Onset  . Diabetes Mother   . Hypertension Mother   . Hyperlipidemia Mother   . Heart disease Mother   . Stroke Mother   . Kidney disease Mother   . Thyroid disease Mother   . Sleep apnea Mother   . Obesity Mother   . Kidney disease Father   . Prostate cancer Father   . Alcoholism Father   . Colitis Brother   . Leukemia Brother   . Multiple sclerosis Sister   . Colon polyps Unknown   . Breast cancer Maternal Aunt   . Breast cancer Cousin   . Colon cancer Neg Hx     ROS: Review of Systems  Constitutional: Positive for weight loss.  Cardiovascular: Negative for chest pain.  Musculoskeletal:       She uses a cane.  Neurological:       Negative for lightheadedness.    PHYSICAL EXAM: Blood pressure 127/79, pulse 68, temperature 97.7 F (36.5 C), temperature source Oral, height 5\' 2"  (1.575 m), weight 215 lb (97.5 kg), SpO2 98 %. Body mass index is 39.32 kg/m. Physical Exam  Constitutional: She is oriented to person, place, and time. She appears well-developed and well-nourished.  Cardiovascular: Normal rate.  Pulmonary/Chest: Effort normal.  Musculoskeletal: Normal range of motion.  Neurological: She is oriented to person, place, and time.  Skin: Skin is warm and dry.  Psychiatric: She has a normal mood and affect. Her behavior is normal.  Vitals reviewed.   RECENT  LABS AND TESTS: BMET    Component Value Date/Time   NA 142 03/05/2018 1040   NA 141 02/28/2017 1035   K 3.6 03/31/2018 1246   K 4.0 02/28/2017 1035   CL 102 03/05/2018 1040   CO2 24 03/05/2018 1040   CO2 28 02/28/2017 1035   GLUCOSE 96 03/05/2018 1040   GLUCOSE 87 11/15/2017 1503   GLUCOSE 80 02/28/2017 1035   BUN 11 03/05/2018 1040   BUN 9.0 02/28/2017 1035   CREATININE 0.81 03/05/2018 1040   CREATININE 0.9 02/28/2017 1035   CALCIUM 9.9 03/05/2018 1040   CALCIUM 10.1 02/28/2017 1035   GFRNONAA 78 03/05/2018 1040   GFRNONAA 71 05/25/2015 1508   GFRAA 90 03/05/2018 1040   GFRAA 82 05/25/2015 1508   Lab Results  Component Value Date   HGBA1C 5.5 03/05/2018   HGBA1C 5.2 11/15/2017   HGBA1C 5.6 04/05/2017   HGBA1C 5.5 10/15/2016   HGBA1C 5.3 05/30/2016   Lab Results  Component Value Date   INSULIN 6.8 03/05/2018   CBC    Component Value Date/Time   WBC 5.9 11/15/2017 1503   RBC 3.68 (L) 11/15/2017 1503   HGB 11.7 (L) 11/15/2017 1503   HGB 11.9 02/28/2017 1035   HCT 35.3 (L) 11/15/2017 1503   HCT 37.0 02/28/2017 1035   PLT 126.0 (L) 11/15/2017 1503   PLT 114 (L) 02/28/2017 1035   MCV 95.8 11/15/2017 1503   MCV 94.9 02/28/2017 1035   MCH 30.5 02/28/2017 1035   MCH 31.1 10/18/2016 1203   MCHC 33.1 11/15/2017 1503   RDW 15.8 (H) 11/15/2017 1503   RDW 15.5 (H) 02/28/2017 1035   LYMPHSABS 2.2 11/15/2017 1503   LYMPHSABS 1.8 02/28/2017 1035   MONOABS 0.4 11/15/2017 1503   MONOABS 0.5 02/28/2017 1035   EOSABS 0.1 11/15/2017 1503   EOSABS 0.1 02/28/2017 1035  BASOSABS 0.0 11/15/2017 1503   BASOSABS 0.0 02/28/2017 1035   Iron/TIBC/Ferritin/ %Sat    Component Value Date/Time   IRON 54 09/26/2010 1236   FERRITIN 45.5 09/26/2010 1236   IRONPCTSAT 15.3 (L) 09/26/2010 1236   Lipid Panel     Component Value Date/Time   CHOL 116 11/15/2017 1503   TRIG 63.0 11/15/2017 1503   TRIG 66 03/11/2009   HDL 44.40 11/15/2017 1503   CHOLHDL 3 11/15/2017 1503   VLDL  12.6 11/15/2017 1503   LDLCALC 59 11/15/2017 1503   LDLCALC 13 03/11/2009   LDLDIRECT 143.8 07/01/2012 1108   Hepatic Function Panel     Component Value Date/Time   PROT 6.8 03/05/2018 1040   PROT 7.3 02/28/2017 1035   ALBUMIN 4.1 03/05/2018 1040   ALBUMIN 3.4 (L) 02/28/2017 1035   AST 18 03/05/2018 1040   AST 18 02/28/2017 1035   ALT 14 03/05/2018 1040   ALT 17 02/28/2017 1035   ALKPHOS 99 03/05/2018 1040   ALKPHOS 92 02/28/2017 1035   BILITOT 0.3 03/05/2018 1040   BILITOT 0.27 02/28/2017 1035   BILIDIR 0.1 09/24/2016 1316   IBILI 0.3 09/24/2016 1316      Component Value Date/Time   TSH 1.37 11/15/2017 1503   TSH 1.67 10/15/2016 1227   TSH 2.62 05/30/2016 1144   Results for Janet Le, Janet Le (MRN [id number]) as of 04/01/2018 12:14  Ref. Range 03/05/2018 10:40  Vitamin D, 25-Hydroxy Latest Ref Range: 30.0 - 100.0 ng/mL 46.7    ASSESSMENT AND PLAN: Insulin resistance  Hypokalemia - Plan: Potassium, CANCELED: Potassium  Essential hypertension  At risk for diabetes mellitus  Class 2 severe obesity with serious comorbidity and body mass index (BMI) of 39.0 to 39.9 in adult, unspecified obesity type ([place])  PLAN:  Insulin Resistance Janet Le will continue to work on weight loss, exercise, and decreasing simple carbohydrates in her diet to help decrease the risk of diabetes. She was informed that eating too many simple carbohydrates or too many calories at one sitting increases the likelihood of GI side effects. She agrees to decrease carbohydrates and to increase protein in her diet. Janet Le agreed to follow up with [place] as directed to monitor her progress in 2 weeks.  Diabetes risk counseling Janet Le was given extended (15 minutes) diabetes prevention counseling today. She is 62 y.o. female and has risk factors for diabetes including insulin resistance and obesity. We discussed intensive lifestyle modifications today with an emphasis on weight loss as well as increasing exercise  and decreasing simple carbohydrates in her diet.  Mild Hypokalemia We will check her potassium level today. She agrees to increase potassium rich food into her diet and return in 2 weeks.  Hypertension We discussed sodium restriction, working on healthy weight loss, and a regular exercise program as the means to achieve improved blood pressure control. We will continue to monitor her blood pressure as well as her progress with the above lifestyle modifications. She will continue her medications as prescribed for hypertension and will watch for signs of hypotension as she continues her lifestyle modifications. Janet Le agreed with this plan and agreed to follow up as directed in 2 weeks.  Obesity Janet Le is currently in the action stage of change. As such, her goal is to continue with weight loss efforts. She has agreed to follow the Category 1 plan and to increase protein. Janet Le has been instructed to work up to a goal of 150 minutes of combined cardio and strengthening exercise per week for weight  loss and overall health benefits. We discussed the following Behavioral Modification Strategies today: increasing lean protein intake, decreasing simple carbohydrates, increasing vegetables, decrease eating out, and no skipping meals.  Janet Le has agreed to follow up with our clinic in 2 weeks. She was informed of the importance of frequent follow up visits to maximize her success with intensive lifestyle modifications for her multiple health conditions.   OBESITY BEHAVIORAL INTERVENTION VISIT  Today's visit was # 3   Starting weight: 218 lbs Starting date: 03/05/18 Today's weight : Weight: 215 lb (97.5 kg)  Today's date: 03/31/2018 Total lbs lost to date: 3  ASK: We discussed the diagnosis of obesity with Janet Le today and Janet Le agreed to give [place] permission to discuss obesity behavioral modification therapy today.  ASSESS: Janet Le has the diagnosis of obesity and her BMI today is  39.31. Millisa is in the action stage of change.   ADVISE: Nekisha was educated on the multiple health risks of obesity as well as the benefit of weight loss to improve her health. She was advised of the need for long term treatment and the importance of lifestyle modifications to improve her current health and to decrease her risk of future health problems.  AGREE: Multiple dietary modification options and treatment options were discussed and Ilyssa agreed to follow the recommendations documented in the above note.  ARRANGE: Janet Le was educated on the importance of frequent visits to treat obesity as outlined per CMS and USPSTF guidelines and agreed to schedule her next follow up appointment today.  I, Marcille Blanco, am acting as Location manager for [employer]. Owens Shark, DO  I have reviewed the above documentation for accuracy and completeness, and I agree with the above. -Jearld Lesch, DO

## 2018-04-01 NOTE — Telephone Encounter (Signed)
Reviewed chart pt is up-to-date sent rx to [facility]  

## 2018-04-02 NOTE — Telephone Encounter (Signed)
Pt states that [facility] has no refills and she needs new RX for 90 day supply sent to them thru mail order.   [place], [place] - [address] [phone] (Phone) [phone] (Fax)

## 2018-04-02 NOTE — Progress Notes (Signed)
Spoke with the patient and informed her of the potassium results. Patient was pleased. April, [place]

## 2018-04-03 MED ORDER — PROPRANOLOL HCL 80 MG PO TABS: 80.0000 mg | ORAL_TABLET | Freq: Two times a day (BID) | ORAL | 1 refills | Status: DC

## 2018-04-03 MED ORDER — DULOXETINE HCL 60 MG PO CPEP: 60.0000 mg | ORAL_CAPSULE | Freq: Every day | ORAL | 1 refills | Status: DC

## 2018-04-03 NOTE — Telephone Encounter (Signed)
Rx sent 

## 2018-04-03 NOTE — Addendum Note (Signed)
Addended by: Delice Bison E on: 04/03/2018 01:22 PM   Modules accepted: Orders

## 2018-04-05 IMAGING — CR DG CHEST 2V
2 series · 2 of 2 positions shown · non-contrast
Comparison: CT 11/13/2013.

CLINICAL DATA: Knee surgery. Preoperative exam. History of
hypertension.

EXAM:
CHEST  2 VIEW

[w chest pa]
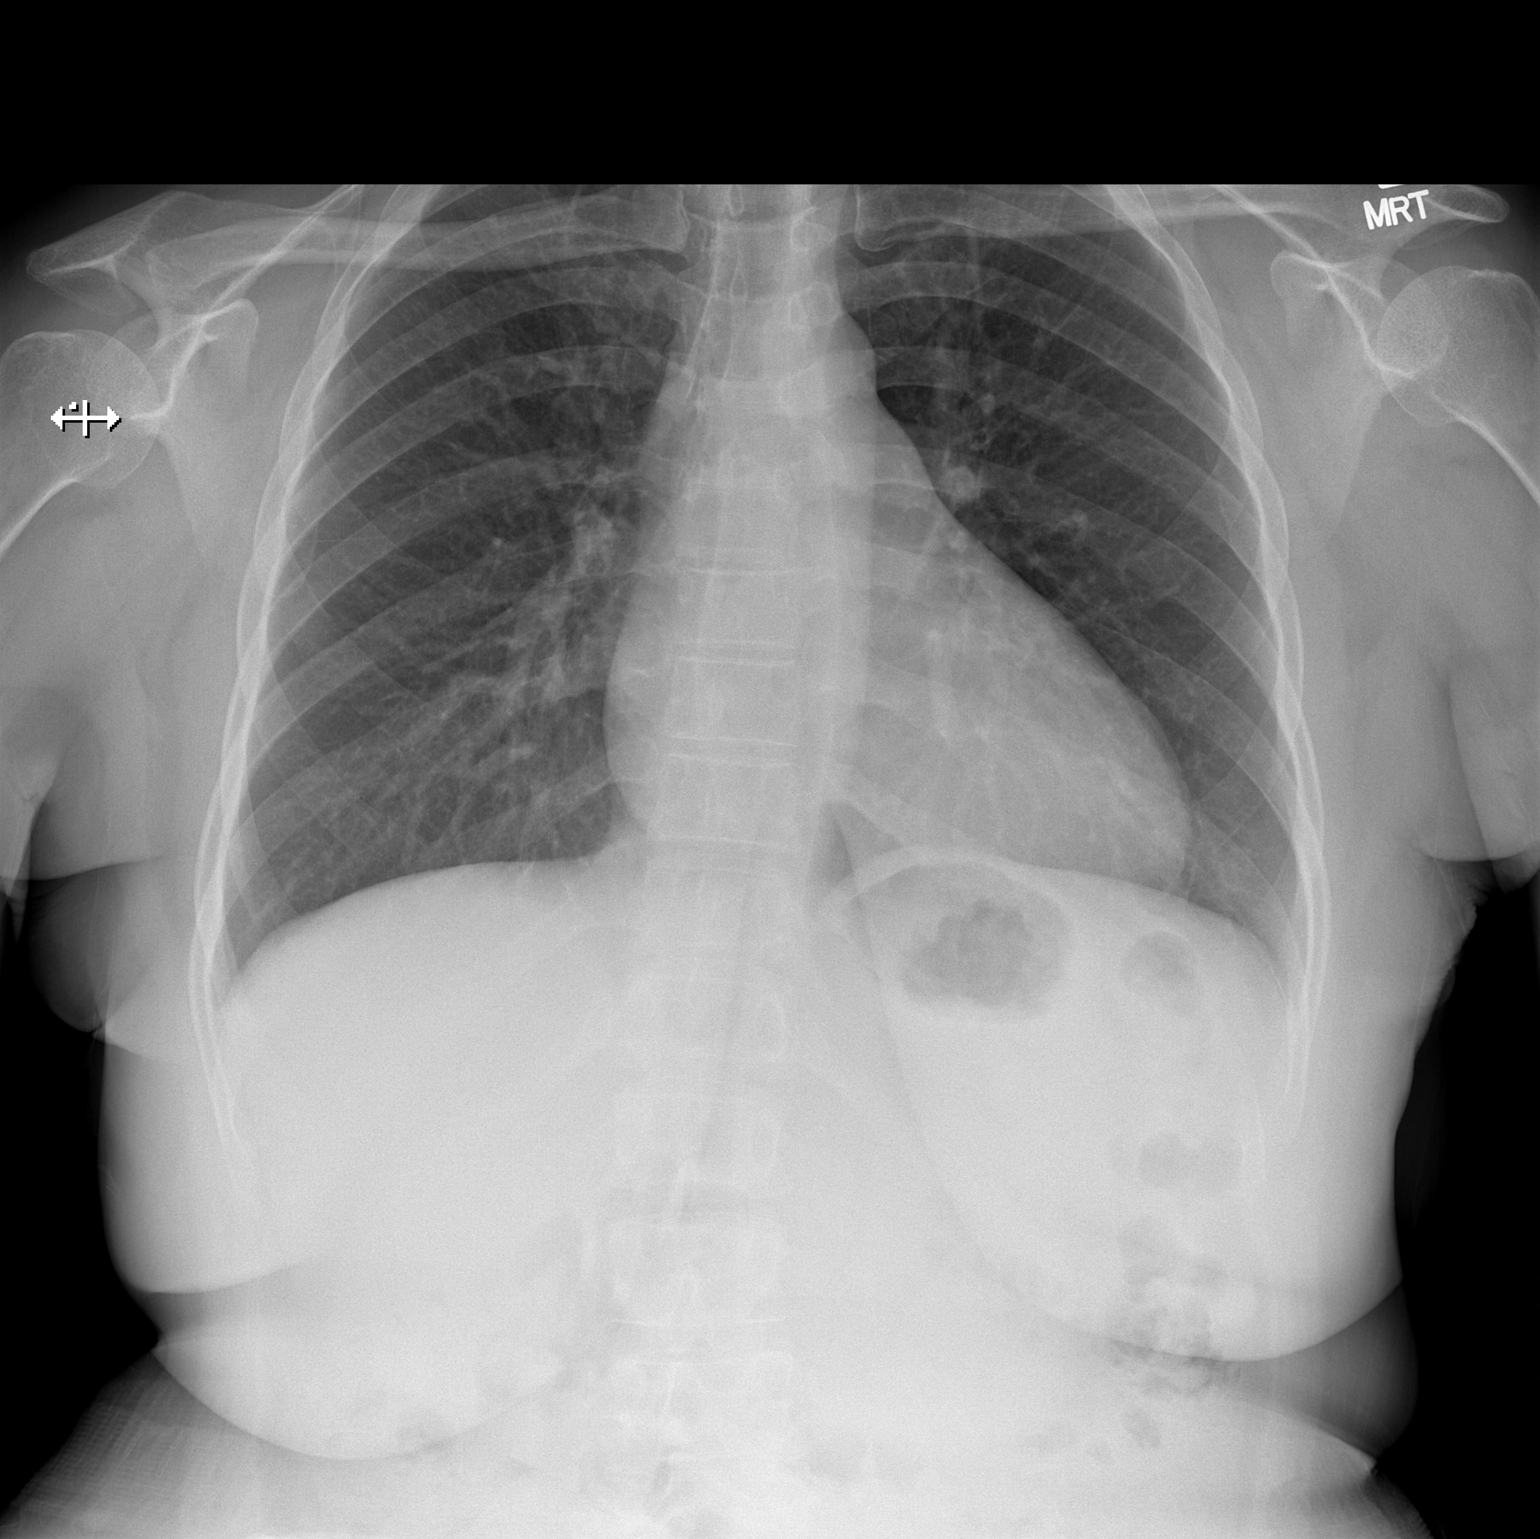

[w chest lat]
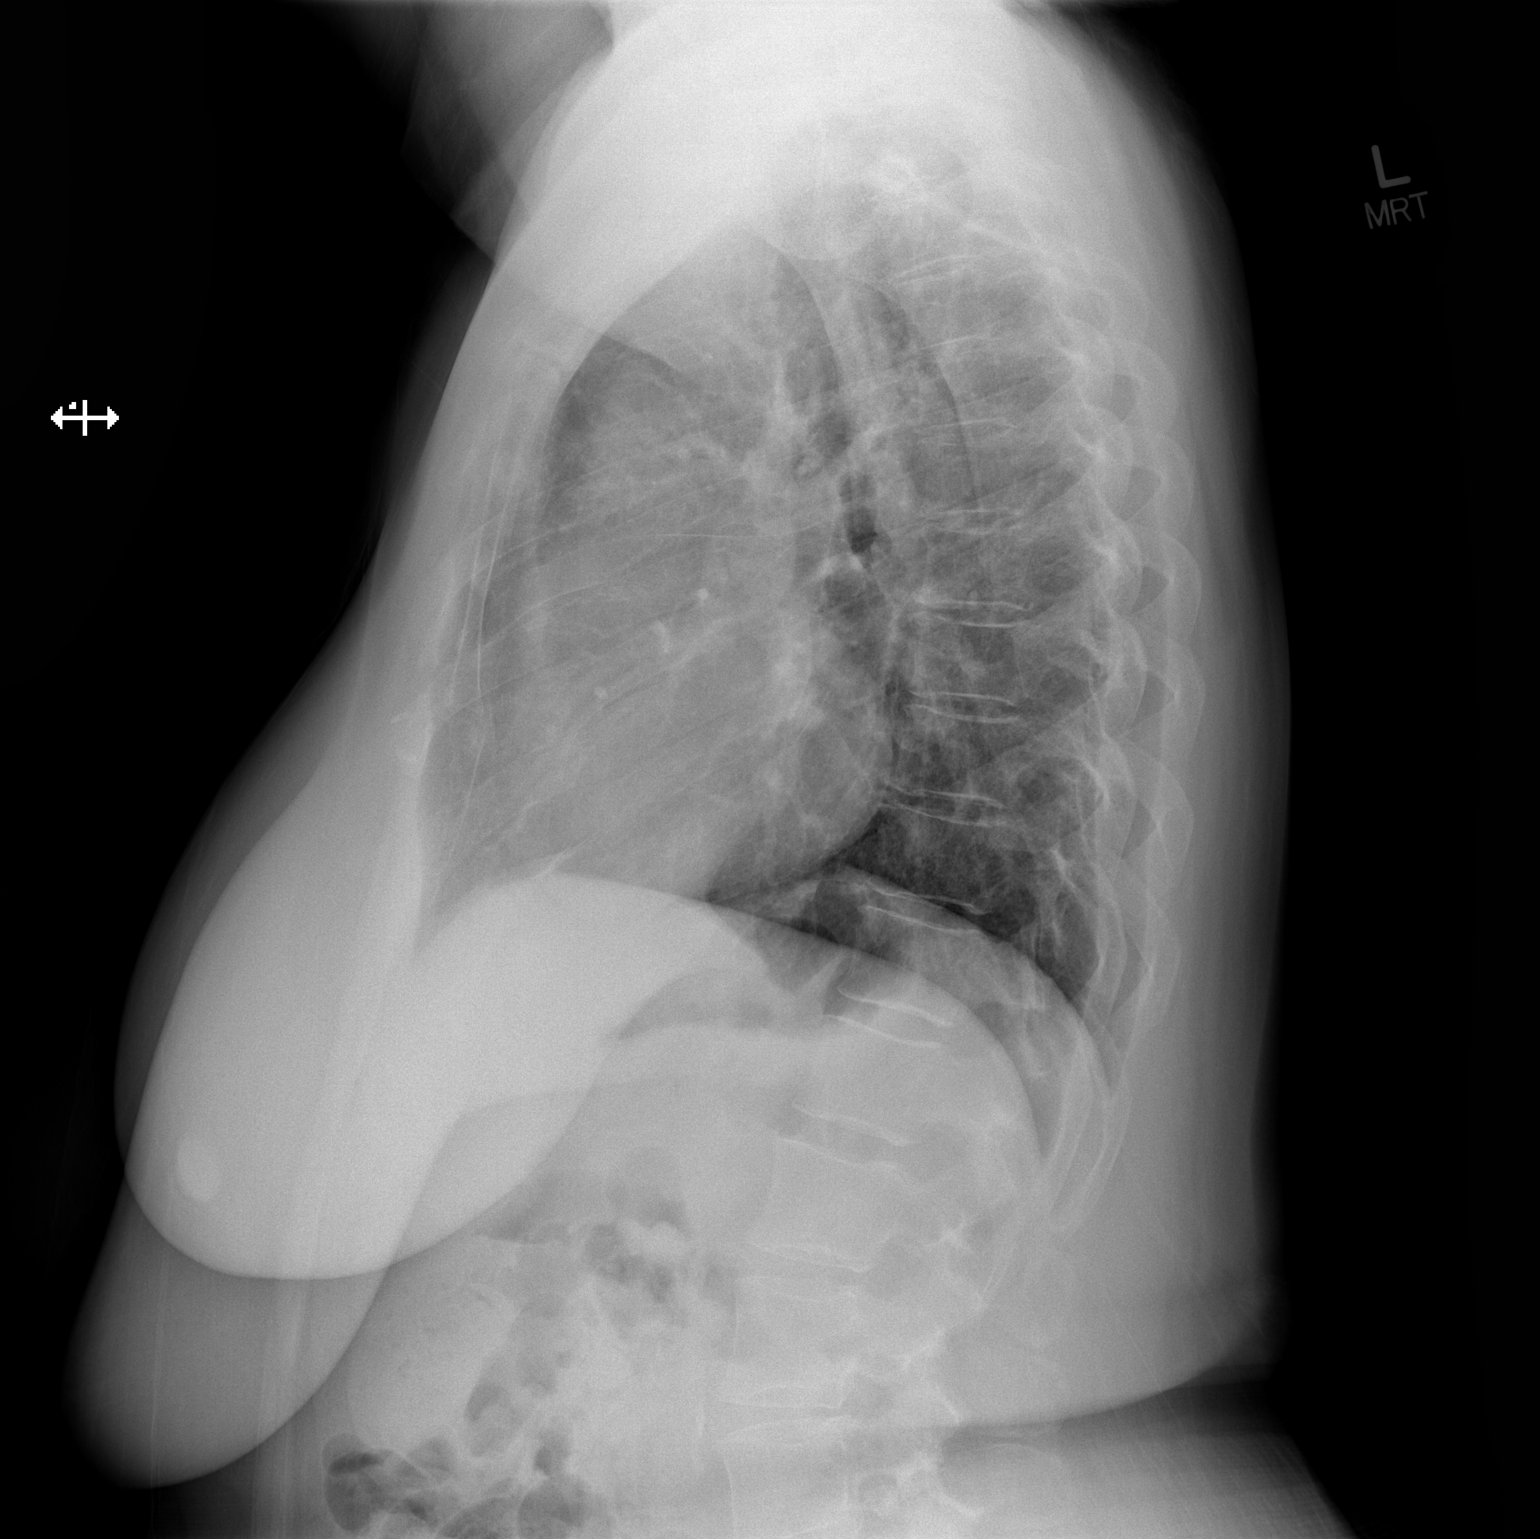

[2 of 2 positions shown; findings below may reference images not displayed]

FINDINGS: Mediastinum and hilar structures are normal. Cardiomegaly with
normal pulmonary vascularity. No focal infiltrate. No pleural
effusion or pneumothorax. No acute bony abnormality .
IMPRESSION: 1. Cardiomegaly.  No pulmonary venous congestion.

2. No acute pulmonary disease .

## 2018-04-07 ENCOUNTER — Other Ambulatory Visit: Payer: Self-pay | Admitting: Internal Medicine

## 2018-04-07 DIAGNOSIS — Z1231 Encounter for screening mammogram for malignant neoplasm of breast: Secondary | ICD-10-CM

## 2018-04-14 ENCOUNTER — Ambulatory Visit (INDEPENDENT_AMBULATORY_CARE_PROVIDER_SITE_OTHER): Payer: BLUE CROSS/BLUE SHIELD | Admitting: Bariatrics

## 2018-04-14 ENCOUNTER — Encounter (INDEPENDENT_AMBULATORY_CARE_PROVIDER_SITE_OTHER): Payer: Self-pay | Admitting: Bariatrics

## 2018-04-14 VITALS — BP 124/85 | HR 84 | Temp 97.9°F | Ht 62.0 in | Wt 210.0 lb

## 2018-04-14 DIAGNOSIS — I10 Essential (primary) hypertension: Secondary | ICD-10-CM | POA: Diagnosis not present

## 2018-04-14 DIAGNOSIS — E7849 Other hyperlipidemia: Secondary | ICD-10-CM | POA: Diagnosis not present

## 2018-04-14 DIAGNOSIS — E8881 Metabolic syndrome: Secondary | ICD-10-CM

## 2018-04-14 DIAGNOSIS — Z6838 Body mass index (BMI) 38.0-38.9, adult: Secondary | ICD-10-CM

## 2018-04-14 DIAGNOSIS — Z9013 Acquired absence of bilateral breasts and nipples: Secondary | ICD-10-CM | POA: Diagnosis not present

## 2018-04-16 NOTE — Progress Notes (Signed)
Office: [phone]  /  Fax: [phone]   HPI:   Chief Complaint: OBESITY Janet Le is here to discuss her progress with her obesity treatment plan. She is on the Category 1 plan and is following her eating plan approximately 50 % of the time. She states she is exercising 0 minutes 0 times per week. Earlean has had a decreased appetite due to grief. She has occasionally skipped a meal and does not have any significant cravings. She is concerned about her copays.  Her weight is 210 lb (95.3 kg) today and has had a weight loss of 5 pounds over a period of 2 weeks since her last visit. She has lost 8 lbs since starting treatment with [place].  Hypertension Janet Le is a 62 y.o. female with hypertension. She is working on weight loss to help control her blood pressure with the goal of decreasing her risk of heart attack and stroke. Janet Le's blood pressure is currently well controlled. She is taking amlodipine 5mg , losartan 100mg , and Micardis 80mg . Janet Le denies chest pain or lightheadedness.  Insulin Resistance Janet Le has a diagnosis of insulin resistance based on her elevated fasting insulin level >5. Although Janet Le's blood glucose readings are still under good control, insulin resistance puts her at greater risk of metabolic syndrome and diabetes. She is not taking metformin currently and continues to work on diet and exercise to decrease risk of diabetes. She denies polyphagia.  Hyperlipidemia Janet Le has hyperlipidemia and has been trying to improve her cholesterol levels with intensive lifestyle modification including a low saturated fat diet, exercise and weight loss. She taking atorvastatin 10mg . She denies any myalgias.  ALLERGIES: Allergies  Allergen Reactions  . Sertraline Hcl Other (See Comments)    Spasms; numbness  . Ace Inhibitors Cough  . Codeine Palpitations  . Darifenacin Hydrobromide Er Other (See Comments)    Pt states made her feel queezy & drunk  . Gabapentin Other  (See Comments)    Hallucinations  . Pravastatin Other (See Comments)    myalgias  . Propoxyphene Hcl Palpitations  . Wellbutrin [Bupropion Hcl] Other (See Comments)    Numbness of mouth/lips    MEDICATIONS: Current Outpatient Medications on File Prior to Visit  Medication Sig Dispense Refill  . alendronate (FOSAMAX) 70 MG tablet TAKE 1 TABLET ONCE A WEEK WITH A FULL GLASS OF WATER ON AN EMPTY STOMACH 12 tablet 1  . AMBULATORY NON FORMULARY MEDICATION 1 Units by Other route daily. Diabetic Shoes 1 Units 0  . amLODipine (NORVASC) 5 MG tablet Take 1 tablet (5 mg total) by mouth daily. 30 tablet 0  . amoxicillin (AMOXIL) 500 MG tablet Take 2 grams 1 hour prior to dental procedure 16 tablet 0  . atorvastatin (LIPITOR) 10 MG tablet TAKE 1 TABLET DAILY 90 tablet 1  . calcium-vitamin D (OSCAL WITH D) 500-200 MG-UNIT tablet Take 1 tablet by mouth daily.     . Diclofenac Sodium 2 % SOLN Apply 1 pump twice daily. (Patient taking differently: Apply 1 application topically 2 (two) times daily as needed (PAIN). Apply 1 pump twice daily.) 112 g 1  . DULoxetine (CYMBALTA) 60 MG capsule Take 1 capsule (60 mg total) by mouth daily. 90 capsule 1  . fluticasone (FLONASE) 50 MCG/ACT nasal spray Place 1 spray into both nostrils daily. 16 g 2  . furosemide (LASIX) 20 MG tablet Take 1 tablet (20 mg total) by mouth daily. 90 tablet 1  . glucose blood (ONETOUCH VERIO) test strip Use one strip to  check blood sugar daily 100 each 4  . lidocaine (LIDODERM) 5 % Place 1 patch onto the skin daily. Remove & Discard patch within 12 hours or as directed by MD 90 patch 1  . Linaclotide (LINZESS) 145 MCG CAPS capsule Take 1 capsule (145 mcg total) by mouth daily. (Patient taking differently: Take 145 mcg by mouth daily as needed (CONSTIPATION). ) 90 capsule 1  . Lorcaserin HCl 10 MG TABS Take 10 mg by mouth 2 (two) times daily. 180 tablet 0  . losartan (COZAAR) 100 MG tablet losartan 100 mg tablet    . methocarbamol (ROBAXIN)  500 MG tablet Take 1 tablet (500 mg total) by mouth every 6 (six) hours as needed for muscle spasms. 30 tablet 2  . NONFORMULARY OR COMPOUNDED ITEM [facility]  Onychomycosis Nail Lacquer -  Fluconazole 2%, Terbinafine 1% DMSO Apply to affected nail once daily Qty. 120 gm 3 refills 1 each 2  . ondansetron (ZOFRAN) 4 MG tablet Take 1-2 tablets (4-8 mg total) by mouth every 8 (eight) hours as needed for nausea or vomiting. 40 tablet 0  . ONETOUCH DELICA LANCETS 24Q MISC 1 each by Does not apply route daily. Use to help check blood sugars once daily Dx 250.00 100 each 3  . Prenatal Vit-Fe Fumarate-FA (PNV PRENATAL PLUS MULTIVITAMIN) 27-1 MG TABS TAKE 1 TABLET DAILY AT 12 NOON 60 tablet 3  . propranolol (INDERAL) 80 MG tablet Take 1 tablet (80 mg total) by mouth 2 (two) times daily. 180 tablet 1  . telmisartan (MICARDIS) 80 MG tablet Take 1 tablet (80 mg total) by mouth daily. 90 tablet 1   No current facility-administered medications on file prior to visit.     PAST MEDICAL HISTORY: Past Medical History:  Diagnosis Date  . Allergic rhinitis, cause unspecified   . Anxiety   . Blood dyscrasia    platelets low in past  . Chronic back pain   . Chronic constipation   . Chronic sinusitis   . Depression   . Endometriosis   . ENDOMETRIOSIS 05/06/2009   Qualifier: Diagnosis of  By: Varney Daily RN, Butch Penny    . Esophageal stricture   . GERD (gastroesophageal reflux disease)   . Hemorrhoids   . Hiatal hernia   . Hyperlipidemia   . Hypertension   . Irritable bowel syndrome   . Lymphedema   . Osteoarthritis   . Personal history of diabetes mellitus    controlled with diet only  . Renal cell cancer ([place]) 11/2010 dx   R, s/p cryoablation 02/16/11  . Syncopal episodes    since childhood    PAST SURGICAL HISTORY: Past Surgical History:  Procedure Laterality Date  . BREAST CYST EXCISION Bilateral over 10 years ago   No visable scar   . BREAST SURGERY     bilateral, cyst removal  .  fallopian tube removed    . FUNCTIONAL ENDOSCOPIC SINUS SURGERY    . HERNIA REPAIR    . LASER ABLATION OF THE CERVIX    . 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 TOTAL KNEE ARTHROPLASTY Right 10/30/2016   Procedure: RIGHT TOTAL KNEE ARTHROPLASTY;  Surgeon: Meredith Pel, MD;  Location: [place];  Service: Orthopedics;  Laterality: Right;  . TUBAL LIGATION      SOCIAL HISTORY: Social History   Tobacco Use  . Smoking status: Never Smoker  . Smokeless tobacco: Never Used  Substance Use Topics  . Alcohol use: No  Comment: rare  . Drug use: No    FAMILY HISTORY: Family History  Problem Relation Age of Onset  . Diabetes Mother   . Hypertension Mother   . Hyperlipidemia Mother   . Heart disease Mother   . Stroke Mother   . Kidney disease Mother   . Thyroid disease Mother   . Sleep apnea Mother   . Obesity Mother   . Kidney disease Father   . Prostate cancer Father   . Alcoholism Father   . Colitis Brother   . Leukemia Brother   . Multiple sclerosis Sister   . Colon polyps Unknown   . Breast cancer Maternal Aunt   . Breast cancer Cousin   . Colon cancer Neg Hx     ROS: Review of Systems  Constitutional: Positive for weight loss.  Cardiovascular: Negative for chest pain.  Musculoskeletal:       She is using her cane for ambulation.  Neurological:       Negative for lightheadedness.  Endo/Heme/Allergies:       Negative for polyphagia.    PHYSICAL EXAM: Blood pressure 124/85, pulse 84, temperature 97.9 F (36.6 C), temperature source Oral, height 5\' 2"  (1.575 m), weight 210 lb (95.3 kg), SpO2 98 %. Body mass index is 38.41 kg/m. Physical Exam  Constitutional: She is oriented to person, place, and time. She appears well-developed and well-nourished.  Cardiovascular: Normal rate.  Pulmonary/Chest: Effort normal.  Musculoskeletal: Normal range of motion.  Neurological: She is oriented to person, place, and time.  Skin: Skin is  warm and dry.  Psychiatric: She has a normal mood and affect. Her behavior is normal.  Vitals reviewed.   RECENT LABS AND TESTS: BMET    Component Value Date/Time   NA 142 03/05/2018 1040   NA 141 02/28/2017 1035   K 3.6 03/31/2018 1246   K 4.0 02/28/2017 1035   CL 102 03/05/2018 1040   CO2 24 03/05/2018 1040   CO2 28 02/28/2017 1035   GLUCOSE 96 03/05/2018 1040   GLUCOSE 87 11/15/2017 1503   GLUCOSE 80 02/28/2017 1035   BUN 11 03/05/2018 1040   BUN 9.0 02/28/2017 1035   CREATININE 0.81 03/05/2018 1040   CREATININE 0.9 02/28/2017 1035   CALCIUM 9.9 03/05/2018 1040   CALCIUM 10.1 02/28/2017 1035   GFRNONAA 78 03/05/2018 1040   GFRNONAA 71 05/25/2015 1508   GFRAA 90 03/05/2018 1040   GFRAA 82 05/25/2015 1508   Lab Results  Component Value Date   HGBA1C 5.5 03/05/2018   HGBA1C 5.2 11/15/2017   HGBA1C 5.6 04/05/2017   HGBA1C 5.5 10/15/2016   HGBA1C 5.3 05/30/2016   Lab Results  Component Value Date   INSULIN 6.8 03/05/2018   CBC    Component Value Date/Time   WBC 5.9 11/15/2017 1503   RBC 3.68 (L) 11/15/2017 1503   HGB 11.7 (L) 11/15/2017 1503   HGB 11.9 02/28/2017 1035   HCT 35.3 (L) 11/15/2017 1503   HCT 37.0 02/28/2017 1035   PLT 126.0 (L) 11/15/2017 1503   PLT 114 (L) 02/28/2017 1035   MCV 95.8 11/15/2017 1503   MCV 94.9 02/28/2017 1035   MCH 30.5 02/28/2017 1035   MCH 31.1 10/18/2016 1203   MCHC 33.1 11/15/2017 1503   RDW 15.8 (H) 11/15/2017 1503   RDW 15.5 (H) 02/28/2017 1035   LYMPHSABS 2.2 11/15/2017 1503   LYMPHSABS 1.8 02/28/2017 1035   MONOABS 0.4 11/15/2017 1503   MONOABS 0.5 02/28/2017 1035   EOSABS 0.1 11/15/2017  1503   EOSABS 0.1 02/28/2017 1035   BASOSABS 0.0 11/15/2017 1503   BASOSABS 0.0 02/28/2017 1035   Iron/TIBC/Ferritin/ %Sat    Component Value Date/Time   IRON 54 09/26/2010 1236   FERRITIN 45.5 09/26/2010 1236   IRONPCTSAT 15.3 (L) 09/26/2010 1236   Lipid Panel     Component Value Date/Time   CHOL 116 11/15/2017 1503    TRIG 63.0 11/15/2017 1503   TRIG 66 03/11/2009   HDL 44.40 11/15/2017 1503   CHOLHDL 3 11/15/2017 1503   VLDL 12.6 11/15/2017 1503   LDLCALC 59 11/15/2017 1503   LDLCALC 13 03/11/2009   LDLDIRECT 143.8 07/01/2012 1108   Hepatic Function Panel     Component Value Date/Time   PROT 6.8 03/05/2018 1040   PROT 7.3 02/28/2017 1035   ALBUMIN 4.1 03/05/2018 1040   ALBUMIN 3.4 (L) 02/28/2017 1035   AST 18 03/05/2018 1040   AST 18 02/28/2017 1035   ALT 14 03/05/2018 1040   ALT 17 02/28/2017 1035   ALKPHOS 99 03/05/2018 1040   ALKPHOS 92 02/28/2017 1035   BILITOT 0.3 03/05/2018 1040   BILITOT 0.27 02/28/2017 1035   BILIDIR 0.1 09/24/2016 1316   IBILI 0.3 09/24/2016 1316      Component Value Date/Time   TSH 1.37 11/15/2017 1503   TSH 1.67 10/15/2016 1227   TSH 2.62 05/30/2016 1144   Results for KYNZLEE, HUCKER (MRN [id number]) as of 04/16/2018 17:17  Ref. Range 03/05/2018 10:40  Vitamin D, 25-Hydroxy Latest Ref Range: 30.0 - 100.0 ng/mL 46.7   ASSESSMENT AND PLAN: Essential hypertension  Insulin resistance  Other hyperlipidemia  Class 2 severe obesity with serious comorbidity and body mass index (BMI) of 38.0 to 38.9 in adult, unspecified obesity type (HCC)  PLAN:  Hypertension We discussed sodium restriction, working on healthy weight loss, and a regular exercise program as the means to achieve improved blood pressure control. Janet Le agreed with this plan and agreed to follow up as directed. We will continue to monitor her blood pressure as well as herprogress with the above lifestyle modifications. She will continue her antihypertensive medications as prescribed and will watch for signs of hypotension as she continues her lifestyle modifications.  Insulin Resistance Janet Le will continue to work on weight loss, exercise, and decreasing simple carbohydrates in her diet to help decrease the risk of diabetes. She was informed that eating too many simple carbohydrates or too many  calories at one sitting increases the likelihood of GI side effects. She agrees to decrease carbohydrates and increase protein. Janet Le agreed to follow up with [place] as directed to monitor her progress.  Hyperlipidemia Janet Le was informed of the American Heart Association Guidelines emphasizing intensive lifestyle modifications as the first line treatment for hyperlipidemia. We discussed many lifestyle modifications today in depth, and Janet Le will continue to work on decreasing saturated fats such as fatty red meat, butter and many fried foods. She will also increase vegetables and lean protein in her diet and continue to work on exercise and weight loss efforts.She agrees to continue her lipid lowering agents.  I spent > than 50% of the 15 minute visit on counseling as documented in the note.  Obesity Janet Le is currently in the action stage of change. As such, her goal is to continue with weight loss efforts. She has agreed to follow the Category 1 plan. She was given a sheet for billing questions and she was given strategies for emotional eating. Janet Le has been instructed to work up to  a goal of 150 minutes of combined cardio and strengthening exercise per week for weight loss and overall health benefits. We discussed the following Behavioral Modification Strategies today: increasing lean protein intake, decreasing simple carbohydrates, increase H2O intake, and no skipping meals.   Elayjah has agreed to follow up with our clinic in 2 weeks. She was informed of the importance of frequent follow up visits to maximize her success with intensive lifestyle modifications for her multiple health conditions.   OBESITY BEHAVIORAL INTERVENTION VISIT  Today's visit was # 4   Starting weight: 218 lbs Starting date: 03/05/18 Today's weight : Weight: 210 lb (95.3 kg)  Today's date: 04/14/2018 Total lbs lost to date: 8  ASK: We discussed the diagnosis of obesity with Janet Le today and Janet Le  agreed to give [place] permission to discuss obesity behavioral modification therapy today.  ASSESS: Janet Le has the diagnosis of obesity and her BMI today is 38.4. Janet Le is in the action stage of change.   ADVISE: Anjalee was educated on the multiple health risks of obesity as well as the benefit of weight loss to improve her health. She was advised of the need for long term treatment and the importance of lifestyle modifications to improve her current health and to decrease her risk of future health problems.  AGREE: Multiple dietary modification options and treatment options were discussed and Kristyne agreed to follow the recommendations documented in the above note.  ARRANGE: Radonna was educated on the importance of frequent visits to treat obesity as outlined per CMS and USPSTF guidelines and agreed to schedule her next follow up appointment today.  I, Marcille Blanco, am acting as Location manager for [employer]. Owens Shark, DO  I have reviewed the above documentation for accuracy and completeness, and I agree with the above. -Jearld Lesch, DO

## 2018-04-17 DIAGNOSIS — E8881 Metabolic syndrome: Secondary | ICD-10-CM | POA: Insufficient documentation

## 2018-04-28 ENCOUNTER — Ambulatory Visit (INDEPENDENT_AMBULATORY_CARE_PROVIDER_SITE_OTHER): Payer: Self-pay | Admitting: Bariatrics

## 2018-04-29 ENCOUNTER — Ambulatory Visit (HOSPITAL_BASED_OUTPATIENT_CLINIC_OR_DEPARTMENT_OTHER): Payer: BLUE CROSS/BLUE SHIELD | Attending: Pulmonary Disease | Admitting: Pulmonary Disease

## 2018-04-29 VITALS — Ht 62.0 in | Wt 217.0 lb

## 2018-04-29 DIAGNOSIS — R0683 Snoring: Secondary | ICD-10-CM | POA: Insufficient documentation

## 2018-04-29 DIAGNOSIS — G4733 Obstructive sleep apnea (adult) (pediatric): Secondary | ICD-10-CM

## 2018-04-29 DIAGNOSIS — G4736 Sleep related hypoventilation in conditions classified elsewhere: Secondary | ICD-10-CM | POA: Insufficient documentation

## 2018-04-30 DIAGNOSIS — H04123 Dry eye syndrome of bilateral lacrimal glands: Secondary | ICD-10-CM | POA: Diagnosis not present

## 2018-04-30 DIAGNOSIS — H05243 Constant exophthalmos, bilateral: Secondary | ICD-10-CM | POA: Diagnosis not present

## 2018-04-30 DIAGNOSIS — H0289 Other specified disorders of eyelid: Secondary | ICD-10-CM | POA: Diagnosis not present

## 2018-04-30 DIAGNOSIS — H16213 Exposure keratoconjunctivitis, bilateral: Secondary | ICD-10-CM | POA: Diagnosis not present

## 2018-05-07 ENCOUNTER — Telehealth: Payer: Self-pay | Admitting: Pulmonary Disease

## 2018-05-07 DIAGNOSIS — G4734 Idiopathic sleep related nonobstructive alveolar hypoventilation: Secondary | ICD-10-CM

## 2018-05-07 NOTE — Procedures (Signed)
POLYSOMNOGRAPHY  Last, First: Janet Le, Janet Le MRN: [id number] Gender: Female Age (years): 44 Weight (lbs): 217 DOB: 04-03-1956 BMI: 40 Primary Care: No PCP Epworth Score: 5 Referring: Laurin Coder MD Technician: Carolin Coy Interpreting: Laurin Coder MD Study Type: NPSG Ordered Study Type: Split Night CPAP Study date: 04/29/2018 Location: [place] CLINICAL INFORMATION Janet Le is a 62 year old Female and was referred to the sleep center for evaluation of G47.33 OSA: Adult and Pediatric (327.23). Indications include Fatigue, Hypertension, Obesity, Snoring.  MEDICATIONS Patient self administered medications include: N/A. Medications administered during study include No sleep medicine administered.  SLEEP STUDY TECHNIQUE A multi-channel overnight Polysomnography study was performed. The channels recorded and monitored were central and occipital EEG, electrooculogram (EOG), submentalis EMG (chin), nasal and oral airflow, thoracic and abdominal wall motion, anterior tibialis EMG, snore microphone, electrocardiogram, and a pulse oximetry. TECHNICIAN COMMENTS Comments added by Technician: PATIENT WAS ORDERED AS A SPLIT NIGHT STUDY. Comments added by Scorer: N/A SLEEP ARCHITECTURE The study was initiated at 10:48:13 PM and terminated at 5:05:44 AM. The total recorded time was 377.5 minutes. EEG confirmed total sleep time was 222.5 minutes yielding a sleep efficiency of 58.9%%. Sleep onset after lights out was 80.9 minutes with a REM latency of N/A minutes. The patient spent 20.0%% of the night in stage N1 sleep, 80.0%% in stage N2 sleep, 0.0%% in stage N3 and 0% in REM. Wake after sleep onset (WASO) was 74.1 minutes. The Arousal Index was 28.0/hour. RESPIRATORY PARAMETERS There were a total of 17 respiratory disturbances out of which 16 were apneas ( 15 obstructive, 0 mixed, 1 central) and 1 hypopneas. The apnea/hypopnea index (AHI) was 4.6 events/hour. The central sleep  apnea index was 0.3 events/hour. The REM AHI was N/A events/hour and NREM AHI was 4.6 events/hour. The supine AHI was 6.5 events/hour and the non supine AHI was 0.8 supine during 66.29% of sleep. Respiratory disturbances were associated with oxygen desaturation down to a nadir of 88.0% during sleep. The mean oxygen saturation during the study was 95.4%. The cumulative time under 88% oxygen saturation was 5.5 minutes.  LEG MOVEMENT DATA The total leg movements were 0 with a resulting leg movement index of 0.0/hr .Associated arousal with leg movement index was 0.0/hr.  CARDIAC DATA The underlying cardiac rhythm was most consistent with sinus rhythm. Mean heart rate during sleep was 57.7 bpm. Additional rhythm abnormalities include PVCs.   IMPRESSIONS No Significant Obstructive Sleep apnea(OSA) Electrocardiographic data showed presence of PVCs. Mild Oxygen Desaturation The patient snored with moderate snoring volume. No significant periodic leg movements(PLMs) during sleep. However, no significant associated arousals.   DIAGNOSIS Nocturnal Hypoxemia (327.26 [G47.36 ICD-10])   RECOMMENDATIONS Avoid alcohol, sedatives and other CNS depressants that may worsen sleep apnea and disrupt normal sleep architecture. Sleep hygiene should be reviewed to assess factors that may improve sleep quality. Weight management and regular exercise should be initiated or continued.  [Electronically signed] 05/07/2018 07:50 PM  Sherrilyn Rist MD NPI: [id number]

## 2018-05-07 NOTE — Telephone Encounter (Signed)
Sleep study result  Date of study, 04/29/2018  Negative study for significant sleep disordered breathing Did have some oxygen desaturations  Order nocturnal oximetry  Encourage lateral sleep position, elevation of the head of the bed which may help snoring Referral to dentist for an oral device may also be considered if snoring is very significant  Routine follow-up as scheduled

## 2018-05-13 ENCOUNTER — Encounter: Payer: BLUE CROSS/BLUE SHIELD | Admitting: Psychology

## 2018-05-14 NOTE — Telephone Encounter (Signed)
LMOM to call back

## 2018-05-16 NOTE — Telephone Encounter (Signed)
lmom to call back 

## 2018-05-23 ENCOUNTER — Telehealth: Payer: Self-pay | Admitting: Pulmonary Disease

## 2018-05-23 NOTE — Telephone Encounter (Signed)
Spoke with patient she is aware of results and order has been placed an apt made.

## 2018-05-23 NOTE — Addendum Note (Signed)
Addended by: Shirlee More R on: 05/23/2018 10:50 AM   Modules accepted: Orders

## 2018-05-23 NOTE — Telephone Encounter (Signed)
Called patient unable to reach left message to give us a call back.

## 2018-05-23 NOTE — Telephone Encounter (Signed)
Spoke with pt, she states she spoke with Nira Conn already. Will close message.   Sleep study result  Date of study, 04/29/2018  Negative study for significant sleep disordered breathing Did have some oxygen desaturations  Order nocturnal oximetry  Encourage lateral sleep position, elevation of the head of the bed which may help snoring Referral to dentist for an oral device may also be considered if snoring is very significant  Routine follow-up as scheduled

## 2018-05-23 NOTE — Telephone Encounter (Signed)
Pt is calling back [phone]

## 2018-05-30 ENCOUNTER — Telehealth: Payer: Self-pay | Admitting: Pulmonary Disease

## 2018-05-30 NOTE — Telephone Encounter (Signed)
Ok will await message until Monday since [facility] is closed now.

## 2018-05-30 NOTE — Telephone Encounter (Signed)
I spoke with pt, she states she was not called about setting up ONO. I cannot tell if the order was sent to a DME. PCC's can you help and tell me where it was sent. Thanks.

## 2018-05-30 NOTE — Telephone Encounter (Signed)
This order was sent to [facility] on 05/23/18 I will send another message back to Jason/cb

## 2018-06-02 ENCOUNTER — Ambulatory Visit: Payer: BLUE CROSS/BLUE SHIELD | Admitting: Internal Medicine

## 2018-06-02 ENCOUNTER — Encounter: Payer: Self-pay | Admitting: Internal Medicine

## 2018-06-02 ENCOUNTER — Encounter: Payer: Self-pay | Admitting: Neurology

## 2018-06-02 VITALS — BP 124/68 | HR 72 | Temp 98.6°F | Resp 16 | Ht 62.0 in | Wt 224.0 lb

## 2018-06-02 DIAGNOSIS — R413 Other amnesia: Secondary | ICD-10-CM | POA: Diagnosis not present

## 2018-06-02 DIAGNOSIS — M25561 Pain in right knee: Secondary | ICD-10-CM

## 2018-06-02 DIAGNOSIS — F32A Depression, unspecified: Secondary | ICD-10-CM

## 2018-06-02 DIAGNOSIS — F329 Major depressive disorder, single episode, unspecified: Secondary | ICD-10-CM | POA: Diagnosis not present

## 2018-06-02 DIAGNOSIS — M25562 Pain in left knee: Secondary | ICD-10-CM

## 2018-06-02 DIAGNOSIS — R600 Localized edema: Secondary | ICD-10-CM

## 2018-06-02 DIAGNOSIS — F419 Anxiety disorder, unspecified: Secondary | ICD-10-CM

## 2018-06-02 DIAGNOSIS — G8929 Other chronic pain: Secondary | ICD-10-CM

## 2018-06-02 MED ORDER — VENLAFAXINE HCL ER 37.5 MG PO CP24: 37.5000 mg | ORAL_CAPSULE | Freq: Every day | ORAL | 5 refills | Status: DC

## 2018-06-02 MED ORDER — BLOOD GLUCOSE MONITOR KIT: PACK | 0 refills | Status: DC

## 2018-06-02 NOTE — Assessment & Plan Note (Signed)
Following with orthopedics Has had injections in the past, which have helped Advised following up with them

## 2018-06-02 NOTE — Telephone Encounter (Signed)
[facility] Corene Cornea unable to reach , left message to call back

## 2018-06-02 NOTE — Assessment & Plan Note (Signed)
Still having difficulty with her memory-her daughter is concerned regarding her memory as well Refer to neuropsychology, but she is leaving the practice-we will refer to Dr. Tomi Likens, who she has seen in the past Stressed the importance of getting her depression and anxiety controlled since this can affect her memory Discussed the importance of regular exercise, good sleep, healthy diet

## 2018-06-02 NOTE — Patient Instructions (Addendum)
   Medications reviewed and updated.  Changes include :   Taper off the cymbalta - take every other day for one week and then stop.  After stopping that start the new medication - venlafaxine 37.5 mg daily.    Your prescription(s) have been submitted to your pharmacy. Please take as directed and contact our office if you believe you are having problem(s) with the medication(s).  A referral was ordered for Dr Tomi Likens  Start exercising regularly.  Continue to work on weight loss.   Please followup in 4-6 weeks    [facility] [place]

## 2018-06-02 NOTE — Assessment & Plan Note (Addendum)
lexapro and cymbalta not effective Taper off the Cymbalta-take every other day for a week and then stop Then start venlafaxine 37.5 mg daily She will call if she has any side effects Follow-up in 4-6 weeks, sooner if needed We will look into seeing a counselor-behavioral health number given Start regular exercise She likely would benefit from a support animal-letter written

## 2018-06-02 NOTE — Progress Notes (Addendum)
Subjective:    Patient ID: Janet Le, female    DOB: Feb 25, 1956, 62 y.o.   MRN: [id number]  HPI The patient is here for an acute visit.   She has depression and anxiety.  She is taking cymbalta daily, which we started her last visit.  She has not seen any difference with taking the medication.  She feels depressed and anxious.  Her mom died in 24-Mar-2023 and she is also dealing with that.  She is having difficulty with sleeping and having nightmares.  A couple of her dreams were very vivid when she woke up she thought they were real.  It took her a while to realize they were not.  She thinks having a pet may help with her anxiety and depression.  She lives in an apartment and would like a letter recommending a support animal.  Memory concerns: She still has concerns regarding her memory as does her daughter.  She was referred to neuropsychology for further evaluation, but that appointment was canceled since the doctor is leaving the practice.  Knee pain; she has seen orthopedics for her knee pain.  She has had injections in the past and it did help.  She thinks she probably need to follow-up with orthopedics.  Prediabetes: She was wondering if she could get a prescription for a glucometer so she could check her sugars.  Lower extremity edema: She does have some mild leg swelling.  She denies shortness of breath or chest pain.   She is currently not exercising regularly.  Medications and allergies reviewed with patient and updated if appropriate.  Patient Active Problem List   Diagnosis Date Noted  . Insulin resistance 04/17/2018  . Fatigue 11/14/2017  . Chronic back pain 11/14/2017  . Arthralgia of hip 11/14/2017  . Memory difficulties 10/08/2017  . Anxiety and depression 10/08/2017  . Decreased sensation of foot 10/07/2017  . LVH (left ventricular hypertrophy), mild 05/18/2017  . Arthritis of knee 10/30/2016  . S/P knee surgery 10/30/2016  . Burning pain 10/15/2016  .  Chronic low back pain 07/18/2016  . Prediabetes 05/30/2016  . Cervicalgia 05/30/2016  . Right knee pain 05/30/2016  . Hair loss 05/30/2016  . Osteopenia 11/18/2015  . Thrombocytopenia ([place]) 05/30/2015  . Arthritis of left lower extremity 02/02/2015  . Left knee pain 09/08/2014  . Gout 12/11/2013  . Obesity with serious comorbidity 11/06/2013  . Osteoporosis   . Cough 07/15/2012  . Left lumbar radiculopathy 04/13/2012  . Renal cell cancer ([place])   . External hemorrhoid 11/14/2010  . IBS (irritable bowel syndrome) 11/14/2010  . GERD (gastroesophageal reflux disease) 11/14/2010  . HYPERCHOLESTEROLEMIA, MILD 03/16/2010  . Lymphedema 03/15/2010  . ALLERGIC RHINITIS 08/31/2009  . MIGRAINE HEADACHE 06/14/2009  . SINUSITIS, CHRONIC 06/14/2009  . SYNCOPE 05/16/2009  . Essential hypertension 05/06/2009  . CONSTIPATION, CHRONIC 05/06/2009    Current Outpatient Medications on File Prior to Visit  Medication Sig Dispense Refill  . alendronate (FOSAMAX) 70 MG tablet TAKE 1 TABLET ONCE A WEEK WITH A FULL GLASS OF WATER ON AN EMPTY STOMACH 12 tablet 1  . AMBULATORY NON FORMULARY MEDICATION 1 Units by Other route daily. Diabetic Shoes 1 Units 0  . amLODipine (NORVASC) 5 MG tablet Take 1 tablet (5 mg total) by mouth daily. 30 tablet 0  . amoxicillin (AMOXIL) 500 MG tablet Take 2 grams 1 hour prior to dental procedure 16 tablet 0  . atorvastatin (LIPITOR) 10 MG tablet TAKE 1 TABLET DAILY 90 tablet 1  .  calcium-vitamin D (OSCAL WITH D) 500-200 MG-UNIT tablet Take 1 tablet by mouth daily.     . Diclofenac Sodium 2 % SOLN Apply 1 pump twice daily. (Patient taking differently: Apply 1 application topically 2 (two) times daily as needed (PAIN). Apply 1 pump twice daily.) 112 g 1  . DULoxetine (CYMBALTA) 60 MG capsule Take 1 capsule (60 mg total) by mouth daily. 90 capsule 1  . fluticasone (FLONASE) 50 MCG/ACT nasal spray Place 1 spray into both nostrils daily. 16 g 2  . furosemide (LASIX) 20 MG tablet  Take 1 tablet (20 mg total) by mouth daily. 90 tablet 1  . glucose blood (ONETOUCH VERIO) test strip Use one strip to check blood sugar daily 100 each 4  . lidocaine (LIDODERM) 5 % Place 1 patch onto the skin daily. Remove & Discard patch within 12 hours or as directed by MD 90 patch 1  . Linaclotide (LINZESS) 145 MCG CAPS capsule Take 1 capsule (145 mcg total) by mouth daily. (Patient taking differently: Take 145 mcg by mouth daily as needed (CONSTIPATION). ) 90 capsule 1  . Lorcaserin HCl 10 MG TABS Take 10 mg by mouth 2 (two) times daily. 180 tablet 0  . losartan (COZAAR) 100 MG tablet losartan 100 mg tablet    . methocarbamol (ROBAXIN) 500 MG tablet Take 1 tablet (500 mg total) by mouth every 6 (six) hours as needed for muscle spasms. 30 tablet 2  . NONFORMULARY OR COMPOUNDED ITEM [facility]  Onychomycosis Nail Lacquer -  Fluconazole 2%, Terbinafine 1% DMSO Apply to affected nail once daily Qty. 120 gm 3 refills 1 each 2  . ondansetron (ZOFRAN) 4 MG tablet Take 1-2 tablets (4-8 mg total) by mouth every 8 (eight) hours as needed for nausea or vomiting. 40 tablet 0  . ONETOUCH DELICA LANCETS 27C MISC 1 each by Does not apply route daily. Use to help check blood sugars once daily Dx 250.00 100 each 3  . Prenatal Vit-Fe Fumarate-FA (PNV PRENATAL PLUS MULTIVITAMIN) 27-1 MG TABS TAKE 1 TABLET DAILY AT 12 NOON 60 tablet 3  . propranolol (INDERAL) 80 MG tablet Take 1 tablet (80 mg total) by mouth 2 (two) times daily. 180 tablet 1  . telmisartan (MICARDIS) 80 MG tablet Take 1 tablet (80 mg total) by mouth daily. 90 tablet 1   No current facility-administered medications on file prior to visit.     Past Medical History:  Diagnosis Date  . Allergic rhinitis, cause unspecified   . Anxiety   . Blood dyscrasia    platelets low in past  . Chronic back pain   . Chronic constipation   . Chronic sinusitis   . Depression   . Endometriosis   . ENDOMETRIOSIS 05/06/2009   Qualifier: Diagnosis  of  By: Varney Daily RN, Butch Penny    . Esophageal stricture   . GERD (gastroesophageal reflux disease)   . Hemorrhoids   . Hiatal hernia   . Hyperlipidemia   . Hypertension   . Irritable bowel syndrome   . Lymphedema   . Osteoarthritis   . Personal history of diabetes mellitus    controlled with diet only  . Renal cell cancer ([place]) 11/2010 dx   R, s/p cryoablation 02/16/11  . Syncopal episodes    since childhood    Past Surgical History:  Procedure Laterality Date  . BREAST CYST EXCISION Bilateral over 10 years ago   No visable scar   . BREAST SURGERY     bilateral, cyst removal  .  fallopian tube removed    . FUNCTIONAL ENDOSCOPIC SINUS SURGERY    . HERNIA REPAIR    . LASER ABLATION OF THE CERVIX    . 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 TOTAL KNEE ARTHROPLASTY Right 10/30/2016   Procedure: RIGHT TOTAL KNEE ARTHROPLASTY;  Surgeon: Meredith Pel, MD;  Location: [facility];  Service: Orthopedics;  Laterality: Right;  . TUBAL LIGATION      Social History   Socioeconomic History  . Marital status: Legally Separated    Spouse name: Not on file  . Number of children: Not on file  . Years of education: Not on file  . Highest education level: Not on file  Occupational History  . Occupation: Disabled  Social Needs  . Financial resource strain: Not on file  . Food insecurity:    Worry: Not on file    Inability: Not on file  . Transportation needs:    Medical: Not on file    Non-medical: Not on file  Tobacco Use  . Smoking status: Never Smoker  . Smokeless tobacco: Never Used  Substance and Sexual Activity  . Alcohol use: No    Comment: rare  . Drug use: No  . Sexual activity: Never  Lifestyle  . Physical activity:    Days per week: Not on file    Minutes per session: Not on file  . Stress: Not on file  Relationships  . Social connections:    Talks on phone: Not on file    Gets together: Not on file    Attends religious service: Not on  file    Active member of club or organization: Not on file    Attends meetings of clubs or organizations: Not on file    Relationship status: Not on file  Other Topics Concern  . Not on file  Social History Narrative  . Not on file    Family History  Problem Relation Age of Onset  . Diabetes Mother   . Hypertension Mother   . Hyperlipidemia Mother   . Heart disease Mother   . Stroke Mother   . Kidney disease Mother   . Thyroid disease Mother   . Sleep apnea Mother   . Obesity Mother   . Kidney disease Father   . Prostate cancer Father   . Alcoholism Father   . Colitis Brother   . Leukemia Brother   . Multiple sclerosis Sister   . Colon polyps Unknown   . Breast cancer Maternal Aunt   . Breast cancer Cousin   . Colon cancer Neg Hx     Review of Systems  Constitutional: Positive for fatigue.  Cardiovascular: Positive for leg swelling.  Musculoskeletal: Positive for gait problem.  Neurological: Negative for light-headedness and headaches.  Psychiatric/Behavioral: Positive for dysphoric mood and sleep disturbance. The patient is nervous/anxious.        Objective:   Vitals:   06/02/18 1302  BP: 124/68  Pulse: 72  Resp: 16  Temp: 98.6 F (37 C)  SpO2: 99%   BP Readings from Last 3 Encounters:  06/02/18 124/68  04/14/18 124/85  03/31/18 127/79   Wt Readings from Last 3 Encounters:  06/02/18 224 lb (101.6 kg)  04/29/18 217 lb (98.4 kg)  04/14/18 210 lb (95.3 kg)   Body mass index is 40.97 kg/m.   Physical Exam    Constitutional: Appears well-developed and well-nourished. No distress.  HENT:  Head: Normocephalic and atraumatic.  Neck: Neck supple. No tracheal deviation present. No thyromegaly present.  No cervical lymphadenopathy Cardiovascular: Normal rate, regular rhythm and normal heart sounds.   No murmur heard. No carotid bruit .  Mild b/l LE edema Pulmonary/Chest: Effort normal and breath sounds normal. No respiratory distress. No has no wheezes.  No rales.  Skin: Skin is warm and dry. Not diaphoretic.  Psychiatric: Normal mood and affect. Behavior is normal.       Assessment & Plan:    See Problem List for Assessment and Plan of chronic medical problems.

## 2018-06-03 ENCOUNTER — Encounter: Payer: BLUE CROSS/BLUE SHIELD | Admitting: Psychology

## 2018-06-03 NOTE — Telephone Encounter (Signed)
Called and spoke with Corene Cornea, he stated that the patient was scheduled for 06/03/18 and patient is aware. Nothing further needed. Will close this encounter. Nothing further needed.

## 2018-06-04 DIAGNOSIS — Z9013 Acquired absence of bilateral breasts and nipples: Secondary | ICD-10-CM | POA: Diagnosis not present

## 2018-06-09 ENCOUNTER — Ambulatory Visit (HOSPITAL_COMMUNITY): Payer: BLUE CROSS/BLUE SHIELD | Admitting: Licensed Clinical Social Worker

## 2018-06-10 ENCOUNTER — Encounter: Payer: Self-pay | Admitting: Internal Medicine

## 2018-06-10 ENCOUNTER — Telehealth: Payer: Self-pay | Admitting: Internal Medicine

## 2018-06-10 ENCOUNTER — Ambulatory Visit
Admission: RE | Admit: 2018-06-10 | Discharge: 2018-06-10 | Disposition: A | Discharge status: Home / Self Care | Payer: BLUE CROSS/BLUE SHIELD | Source: Ambulatory Visit | Attending: Internal Medicine | Admitting: Internal Medicine

## 2018-06-10 DIAGNOSIS — Z1231 Encounter for screening mammogram for malignant neoplasm of breast: Secondary | ICD-10-CM

## 2018-06-10 DIAGNOSIS — M810 Age-related osteoporosis without current pathological fracture: Secondary | ICD-10-CM

## 2018-06-10 DIAGNOSIS — M85852 Other specified disorders of bone density and structure, left thigh: Secondary | ICD-10-CM | POA: Diagnosis not present

## 2018-06-10 DIAGNOSIS — Z780 Asymptomatic menopausal state: Secondary | ICD-10-CM | POA: Diagnosis not present

## 2018-06-10 DIAGNOSIS — R600 Localized edema: Secondary | ICD-10-CM

## 2018-06-10 NOTE — Telephone Encounter (Signed)
Copied from [facility] [phone] [phone]. Topic: Quick Communication - See Telephone Encounter >> Jun 10, 2018  9:25 AM Vernona Rieger wrote: CRM for notification. See Telephone encounter for: 06/10/18.  Melissa with " [facility] " called and said she would like for her to have a prescription for " thigh highs ". Please fax to [phone].

## 2018-06-10 NOTE — Telephone Encounter (Signed)
LVM for pt to call back in regards.  

## 2018-06-11 ENCOUNTER — Telehealth: Payer: Self-pay | Admitting: Pulmonary Disease

## 2018-06-11 DIAGNOSIS — G4733 Obstructive sleep apnea (adult) (pediatric): Secondary | ICD-10-CM

## 2018-06-11 DIAGNOSIS — G4734 Idiopathic sleep related nonobstructive alveolar hypoventilation: Secondary | ICD-10-CM

## 2018-06-11 NOTE — Telephone Encounter (Signed)
Pt is aware that I will call [facility] in the morning to get ONO results faxed to our office; aware that I will have another MD or NP advise on results as AO is not back in the clinic until 06/23/18.

## 2018-06-11 NOTE — Telephone Encounter (Signed)
Spoke with pt just to clarify she did want an rx sent to [facility] for thigh high's. Pt gave ok to send rx.

## 2018-06-11 NOTE — Addendum Note (Signed)
Addended by: Delice Bison E on: 06/11/2018 02:28 PM   Modules accepted: Orders

## 2018-06-11 NOTE — Telephone Encounter (Signed)
LVM for pt to call back.

## 2018-06-11 NOTE — Telephone Encounter (Signed)
Patient has called back in regard.  Please follow up as soon as possible.

## 2018-06-12 DIAGNOSIS — R600 Localized edema: Secondary | ICD-10-CM

## 2018-06-12 HISTORY — DX: Localized edema: R60.0

## 2018-06-12 NOTE — Assessment & Plan Note (Signed)
Mild Discussed low-sodium diet Elevating legs when sitting Would benefit from compression socks Already on Lasix

## 2018-06-12 NOTE — Telephone Encounter (Signed)
Lm for [facility] with [facility] requesting that ONO results be faxed to our office.

## 2018-06-12 NOTE — Addendum Note (Signed)
Addended by: Binnie Rail on: 06/12/2018 07:40 AM   Modules accepted: Orders

## 2018-06-16 ENCOUNTER — Encounter: Payer: Self-pay | Admitting: Neurology

## 2018-06-16 ENCOUNTER — Ambulatory Visit: Payer: BLUE CROSS/BLUE SHIELD | Admitting: Neurology

## 2018-06-16 VITALS — BP 110/78 | HR 56 | Ht 62.0 in | Wt 226.5 lb

## 2018-06-16 DIAGNOSIS — R413 Other amnesia: Secondary | ICD-10-CM

## 2018-06-16 NOTE — Progress Notes (Addendum)
NEUROLOGY FOLLOW UP OFFICE NOTE  Janet Le [id number]  HISTORY OF PRESENT ILLNESS: Janet Le is a 62 year old right-handed woman with history of renal cell carcinoma, hypertension, prediabetes, migraine, IBS, lymphedema, osteoarthritis and osteoporosis whom I previously saw for multiple complaints such as dizziness, neuropathy, visual disturbance, and tremor who follows up today for another issue, namely memory complaints.  UPDATE: She reports today about memory deficits.  They started last year and has gotten worse.  She reports misplacing objects or forgetting things she said.  Sometimes when she is cooking, she may do something that wasn't required by the recipe.  She reports anxiety while driving but no disorientation on familiar routes.  She has forgotten to pay bills but then remembers and is able to pay within the grace period.  She sometimes has trouble recalling her grandchildren's names for a moment.  She does have depression and anxiety.  Labs from May included normal B12 528 and TSH 1.37.  Her mother had Alzheimer's.   She also reports tremors or abnormal movements involving all of her body.  HISTORY: NEUROPATHY:  In 2016, she started experiencing burning pain from the bottom of her feet up the back of her legs to the knees.  It is intermittent and lasts for various and unknown period of time, but not aware of any triggers such as due to position.  In addition, she is reporting increased back pain and spasms, worse when sitting and improved when laying supine.  She has a history of chronic low back pain with left lumbar radiculopathy, as well as right knee pain.  She has been seen and evaluated by orthopedics and pain management.  MRI of lumbar spine from 07/27/16 revealed advanced L4-5 and L5-S1 facet arthrosis with some bilateral neural foraminal stenosis.  Dr. Marlou Sa of orthopedics did not think surgery was an option at that time.  TSH from 10/15/16 was 1.67.  B12 from 10/18/16  was 537.  Hgb A1c from 04/05/17 was 5.6.  NCV-EMG from 10/25/16 demonstrated no evidence of large fiber sensorimotor polyneuropathy or lumbosacral radiculopathy.   Other History: VISUAL DISTURBANCE:  Symptoms started in 2014.  She describes initially seeing a line of light along the bottom of her visual field in the left eye.  She then developed what looks like bubbles floating up in the temporal aspect of the visual fields of in both eyes.  It lasts only a few seconds and occurs several times a day.  There is no vision loss.  There is no associated headache.  There was a concern for diabetic retinopathy.  She saw Dr. Katy Fitch, an ophthalmologist on 11/02/13.  The exam did not reveal diabetic retinopathy, but a cardiac evaluation was recommended.  Possibly ocular migraines.  DIZZINESS:  Also in 2014, she began feeling dizzy.  She describes it as a feeling of lightheadedness, as if she is going to pass out.  There is no spinning sensation.  She sometimes hears "static" during these spells.  She has been found to have low blood pressure and is orthostatic.  She also began feeling diffuse pain in her joints.  Uric acid level was 11.4.  HCTZ was discontinued nd she was placed on colchicine.   HAND TREMORS:    Since early 2019, she notes that her hands will start moving spontaneously.  It occurs when she is using her hands in particular position.  For example, if she is holding her cell phone with her index finger on the screen, her index finger  will start to tremor or even start tapping.  She notices that her hand will shake a bit if she is holding a utensil.  It does not occur when her hands are completely at rest, such as laying flat on her lap.  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    MRI of brain without contrast from 12/19/13 demonstrated incidental small 11 mm dural calcification but nothing  concerning.  PAST MEDICAL HISTORY: Past Medical History:  Diagnosis Date  . Allergic rhinitis, cause unspecified   . Anxiety   . Blood dyscrasia    platelets low in past  . Chronic back pain   . Chronic constipation   . Chronic sinusitis   . Depression   . Endometriosis   . ENDOMETRIOSIS 05/06/2009   Qualifier: Diagnosis of  By: Varney Daily RN, Butch Penny    . Esophageal stricture   . GERD (gastroesophageal reflux disease)   . Hemorrhoids   . Hiatal hernia   . Hyperlipidemia   . Hypertension   . Irritable bowel syndrome   . Lymphedema   . Osteoarthritis   . Personal history of diabetes mellitus    controlled with diet only  . Renal cell cancer ([place]) 11/2010 dx   R, s/p cryoablation 02/16/11  . Syncopal episodes    since childhood    MEDICATIONS: Current Outpatient Medications on File Prior to Visit  Medication Sig Dispense Refill  . alendronate (FOSAMAX) 70 MG tablet TAKE 1 TABLET ONCE A WEEK WITH A FULL GLASS OF WATER ON AN EMPTY STOMACH 12 tablet 1  . AMBULATORY NON FORMULARY MEDICATION 1 Units by Other route daily. Diabetic Shoes 1 Units 0  . amLODipine (NORVASC) 5 MG tablet Take 1 tablet (5 mg total) by mouth daily. 30 tablet 0  . amoxicillin (AMOXIL) 500 MG tablet Take 2 grams 1 hour prior to dental procedure 16 tablet 0  . atorvastatin (LIPITOR) 10 MG tablet TAKE 1 TABLET DAILY 90 tablet 1  . blood glucose meter kit and supplies KIT Dispense based on patient and insurance preference. Use up to four times daily as directed. (FOR R73.03). 1 each 0  . calcium-vitamin D (OSCAL WITH D) 500-200 MG-UNIT tablet Take 1 tablet by mouth daily.     . Diclofenac Sodium 2 % SOLN Apply 1 pump twice daily. (Patient taking differently: Apply 1 application topically 2 (two) times daily as needed (PAIN). Apply 1 pump twice daily.) 112 g 1  . fluticasone (FLONASE) 50 MCG/ACT nasal spray Place 1 spray into both nostrils daily. 16 g 2  . furosemide (LASIX) 20 MG tablet Take 1 tablet (20 mg total)  by mouth daily. 90 tablet 1  . lidocaine (LIDODERM) 5 % Place 1 patch onto the skin daily. Remove & Discard patch within 12 hours or as directed by MD 90 patch 1  . Linaclotide (LINZESS) 145 MCG CAPS capsule Take 1 capsule (145 mcg total) by mouth daily. (Patient taking differently: Take 145 mcg by mouth daily as needed (CONSTIPATION). ) 90 capsule 1  . losartan (COZAAR) 100 MG tablet losartan 100 mg tablet    . methocarbamol (ROBAXIN) 500 MG tablet Take 1 tablet (500 mg total) by mouth every 6 (six) hours as needed for muscle spasms. 30 tablet 2  . NONFORMULARY OR COMPOUNDED ITEM [facility]  Onychomycosis Nail Lacquer -  Fluconazole 2%, Terbinafine 1% DMSO Apply to affected nail once daily Qty. 120 gm  3 refills 1 each 2  . ondansetron (ZOFRAN) 4 MG tablet Take 1-2 tablets (4-8 mg total) by mouth every 8 (eight) hours as needed for nausea or vomiting. 40 tablet 0  . Prenatal Vit-Fe Fumarate-FA (PNV PRENATAL PLUS MULTIVITAMIN) 27-1 MG TABS TAKE 1 TABLET DAILY AT 12 NOON 60 tablet 3  . propranolol (INDERAL) 80 MG tablet Take 1 tablet (80 mg total) by mouth 2 (two) times daily. 180 tablet 1  . telmisartan (MICARDIS) 80 MG tablet Take 1 tablet (80 mg total) by mouth daily. 90 tablet 1  . venlafaxine XR (EFFEXOR XR) 37.5 MG 24 hr capsule Take 1 capsule (37.5 mg total) by mouth daily with breakfast. 30 capsule 5   No current facility-administered medications on file prior to visit.     ALLERGIES: Allergies  Allergen Reactions  . Sertraline Hcl Other (See Comments)    Spasms; numbness  . Ace Inhibitors Cough  . Codeine Palpitations  . Darifenacin Hydrobromide Er Other (See Comments)    Pt states made her feel queezy & drunk  . Gabapentin Other (See Comments)    Hallucinations  . Pravastatin Other (See Comments)    myalgias  . Propoxyphene Hcl Palpitations  . Wellbutrin [Bupropion Hcl] Other (See Comments)    Numbness of mouth/lips    FAMILY HISTORY: Family History  Problem  Relation Age of Onset  . Diabetes Mother   . Hypertension Mother   . Hyperlipidemia Mother   . Heart disease Mother   . Stroke Mother   . Kidney disease Mother   . Thyroid disease Mother   . Sleep apnea Mother   . Obesity Mother   . Kidney disease Father   . Prostate cancer Father   . Alcoholism Father   . Colitis Brother   . Leukemia Brother   . Multiple sclerosis Sister   . Colon polyps Other   . Breast cancer Maternal Aunt   . Breast cancer Cousin   . Colon cancer Neg Hx    SOCIAL HISTORY: Social History   Socioeconomic History  . Marital status: Legally Separated    Spouse name: Not on file  . Number of children: Not on file  . Years of education: Not on file  . Highest education level: Not on file  Occupational History  . Occupation: Disabled  Social Needs  . Financial resource strain: Not on file  . Food insecurity:    Worry: Not on file    Inability: Not on file  . Transportation needs:    Medical: Not on file    Non-medical: Not on file  Tobacco Use  . Smoking status: Never Smoker  . Smokeless tobacco: Never Used  Substance and Sexual Activity  . Alcohol use: No    Comment: rare  . Drug use: No  . Sexual activity: Never  Lifestyle  . Physical activity:    Days per week: Not on file    Minutes per session: Not on file  . Stress: Not on file  Relationships  . Social connections:    Talks on phone: Not on file    Gets together: Not on file    Attends religious service: Not on file    Active member of club or organization: Not on file    Attends meetings of clubs or organizations: Not on file    Relationship status: Not on file  . Intimate partner violence:    Fear of current or ex partner: Not on file    Emotionally abused:  Not on file    Physically abused: Not on file    Forced sexual activity: Not on file  Other Topics Concern  . Not on file  Social History Narrative  . Not on file    REVIEW OF SYSTEMS: Constitutional: No fevers, chills,  or sweats, no generalized fatigue, change in appetite Eyes: No visual changes, double vision, eye pain Ear, nose and throat: No hearing loss, ear pain, nasal congestion, sore throat Cardiovascular: No chest pain, palpitations Respiratory:  No shortness of breath at rest or with exertion, wheezes GastrointestinaI: No nausea, vomiting, diarrhea, abdominal pain, fecal incontinence Genitourinary:  No dysuria, urinary retention or frequency Musculoskeletal:  No neck pain, back pain Integumentary: No rash, pruritus, skin lesions Neurological: as above Psychiatric: depression, anxiety Endocrine: No palpitations, fatigue, diaphoresis, mood swings, change in appetite, change in weight, increased thirst Hematologic/Lymphatic:  No purpura, petechiae. Allergic/Immunologic: no itchy/runny eyes, nasal congestion, recent allergic reactions, rashes  PHYSICAL EXAM: Blood pressure 110/78, pulse (!) 56, height _0  (1.575 m), weight 226 lb 8 oz (102.7 kg), SpO2 99 %. General: No acute distress.  Patient appears well-groomed.  Head:  Normocephalic/atraumatic Eyes:  Fundi examined but not visualized Neck: supple, no paraspinal tenderness, full range of motion Heart:  Regular rate and rhythm Lungs:  Clear to auscultation bilaterally Back: No paraspinal tenderness Neurological Exam: alert and oriented to person, place, and time. Attention span and concentration intact, recent and remote memory intact, fund of knowledge intact.  Speech fluent and not dysarthric, language intact.   MMSE - Mini Mental State Exam 06/16/2018  Orientation to time 5  Orientation to Place 5  Registration 3  Attention/ Calculation 3  Recall 2  Language- name 2 objects 2  Language- repeat 1  Language- follow 3 step command 3  Language- read & follow direction 1  Write a sentence 1  Copy design 1  Total score 27   CN II-XII intact. Bulk and tone normal, muscle strength 5/5 throughout.  Sensation to light touch intact.  Deep  tendon reflexes absent throughout, toes downgoing.  Finger to nose  testing intact.  cautious gait with walker.  IMPRESSION: 1.  Memory deficits.  I suspect that anxiety is playing a role.  However, I recommend neuropsychological testing. 2.  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  Anxiety  PLAN: 1.  Refer for neuropsychological testing 2.  Follow up if testing indicative of neurodegenerative disorder or repeat testing  19 minutes spent face to face with patient, over 50% spent discussing diagnosis and management  Janet Clines, DO  CC:  Billey Gosling, MD  08/06/18 ADDENDUM:  Ms. Babineaux underwent neuropsychological testing 07/09/18.  She demonstrated probable deficits of processing speed, executive function, visual memory significantly more than verbal memory, and Ecologist.  It was noted that such a profile may be seen in early Parkinson's disease or a Parkinson's plus syndrome.  However, her performance may have been skewed due to observed anxiety and mildly suboptimal task persistence.  It was noted that her symptoms may conceivably be somatoform and may be due to premorbid ADHD, emotional stress and physical disability.  Regardless, she did not meet criteria for any type of dementia.  In my opinion, I believe that her cognitive deficits are likely related to psychological factors.  I have examined her on multiple occasions and she does not exhibit any symptoms of parkinsonism on exam.  No further work-up warranted at this time. Janet Clines, DO

## 2018-06-16 NOTE — Telephone Encounter (Signed)
Pt is calling back [phone]

## 2018-06-16 NOTE — Telephone Encounter (Signed)
Has ONO results been received from [facility]?   Will route to [employer]. To follow up

## 2018-06-16 NOTE — Telephone Encounter (Signed)
lmom the ono has now been received  But we are waiting for Dr. Jenetta Downer to come back in office on /06/23/18.

## 2018-06-16 NOTE — Patient Instructions (Signed)
We will refer you to [facility] in [place], [place].  It is about 1 hour and 20 minutes away from here but they are very good and will likely give you an appointment sooner than other providers.  If there is anything concerning on the testing, then follow up with me.

## 2018-06-17 ENCOUNTER — Telehealth: Payer: Self-pay

## 2018-06-17 NOTE — Telephone Encounter (Signed)
I administered the MMSE on patient. Pt is concerned about her memory. She was suppose to see Dr. Si Raider in November. She would like to be seen at [facility] by Dr. Norton Pastel in [facility]. I explained to her that his wait time was out to 7 months. She was okay with the wait.

## 2018-06-17 NOTE — Telephone Encounter (Signed)
Pt is aware that ONO results will be read by Dr. Jenetta Downer when he returns to office 06-23-18.

## 2018-06-17 NOTE — Telephone Encounter (Signed)
Patient returned call, [phone].

## 2018-06-17 NOTE — Telephone Encounter (Signed)
Melissa, from [facility], calling back to request the thigh high's be resent as PRN if possible.   Please fax to [phone] Cb# [phone]

## 2018-06-23 ENCOUNTER — Telehealth: Payer: Self-pay | Admitting: Pulmonary Disease

## 2018-06-23 NOTE — Telephone Encounter (Signed)
Overnight oximetry reviewed 4 hours of <88% desaturations noted on oximetry-performed on room air  This reveals that the patient will need oxygen supplementation  Commend 3 L of oxygen at night Repeat overnight oximetry with 3 L

## 2018-06-23 NOTE — Addendum Note (Signed)
Addended by: Delice Bison E on: 06/23/2018 09:54 AM   Modules accepted: Orders

## 2018-06-23 NOTE — Telephone Encounter (Signed)
New rx printed and sent.

## 2018-06-24 ENCOUNTER — Other Ambulatory Visit: Payer: Self-pay | Admitting: Internal Medicine

## 2018-06-24 ENCOUNTER — Telehealth: Payer: Self-pay | Admitting: Internal Medicine

## 2018-06-24 DIAGNOSIS — H0289 Other specified disorders of eyelid: Secondary | ICD-10-CM | POA: Diagnosis not present

## 2018-06-24 DIAGNOSIS — H47323 Drusen of optic disc, bilateral: Secondary | ICD-10-CM | POA: Diagnosis not present

## 2018-06-24 DIAGNOSIS — H25813 Combined forms of age-related cataract, bilateral: Secondary | ICD-10-CM | POA: Diagnosis not present

## 2018-06-24 DIAGNOSIS — H04123 Dry eye syndrome of bilateral lacrimal glands: Secondary | ICD-10-CM | POA: Diagnosis not present

## 2018-06-24 MED ORDER — VENLAFAXINE HCL ER 75 MG PO CP24: 75.0000 mg | ORAL_CAPSULE | Freq: Every day | ORAL | 5 refills | Status: DC

## 2018-06-24 NOTE — Telephone Encounter (Addendum)
Called and spoke with the patient and advised her of these results and have sent in orders for o2 and to have ono I also have move up apt for the 30 days. Nothing further needed at this time.

## 2018-06-24 NOTE — Telephone Encounter (Signed)
Yes - higher dose sent to pharmacy

## 2018-06-24 NOTE — Addendum Note (Signed)
Addended by: Shirlee More R on: 06/24/2018 12:53 PM   Modules accepted: Orders

## 2018-06-24 NOTE — Telephone Encounter (Signed)
This was results please see phone note.

## 2018-06-24 NOTE — Telephone Encounter (Signed)
Overnight oximetry reviewed  Will require 3 L of oxygen supplementation at night Repeat overnight oximetry with 3 L of oxygen supplementation in place

## 2018-06-24 NOTE — Telephone Encounter (Signed)
Copied from [place] [phone]. Topic: Quick Communication - Rx Refill/Question >> Jun 24, 2018 10:58 AM Sheppard Coil, [employer] L wrote: Medication: venlafaxine XR (EFFEXOR XR) 37.5 MG 24 hr capsule  Pt states that she doesn't feel any different taking this and wants to know if the dose can be increased.  Has the patient contacted their pharmacy? Yes - but they will not adjust the dose for pt (Agent: If no, request that the patient contact the pharmacy for the refill.) (Agent: If yes, when and what did the pharmacy advise?)  Preferred Pharmacy (with phone number or street name): [place], [place] - [address] [phone] (Phone) [phone] (Fax)  Agent: Please be advised that RX refills may take up to 3 business days. We ask that you follow-up with your pharmacy.

## 2018-06-25 HISTORY — PX: CATARACT EXTRACTION, BILATERAL: SHX1313

## 2018-06-26 ENCOUNTER — Telehealth: Payer: Self-pay | Admitting: Pulmonary Disease

## 2018-06-26 DIAGNOSIS — G4734 Idiopathic sleep related nonobstructive alveolar hypoventilation: Secondary | ICD-10-CM

## 2018-06-26 NOTE — Telephone Encounter (Signed)
Spoke with Heather-she is going to contact patient as she has been working with patient on this matter.

## 2018-06-26 NOTE — Telephone Encounter (Addendum)
Called and spoke with patient she is preferring to use [facility]  for her o2. I let her know we would try to make this change she verbalized understanding nothing further needed at this time.

## 2018-06-27 ENCOUNTER — Other Ambulatory Visit: Payer: Self-pay

## 2018-06-27 ENCOUNTER — Telehealth: Payer: Self-pay | Admitting: Pulmonary Disease

## 2018-06-27 MED ORDER — LIDOCAINE 5 % EX PTCH: 1.0000 | MEDICATED_PATCH | CUTANEOUS | 1 refills | Status: DC

## 2018-06-27 MED ORDER — TELMISARTAN 80 MG PO TABS: 80.0000 mg | ORAL_TABLET | Freq: Every day | ORAL | 1 refills | Status: DC

## 2018-06-27 NOTE — Telephone Encounter (Signed)
Attempted to call [place] x3 to speak with Corene Cornea but each time I tried to call, I immediately got a response that the call could not be completed at this time.   Pt is wanting to use [facility] now instead of [facility] so [facility] can disregard the previous order that was sent to them. Will send Corene Cornea a [employer] as well in regards to this.  Nothing further needed.

## 2018-06-27 NOTE — Telephone Encounter (Signed)
Pt has an upcoming OV scheduled with [facility] Monday, 06/30/2018.  Called and spoke with Bethanne Ginger at [facility] in regards to the reason for her calling. Stated to her that pt does have an OV scheduled. Gilda expressed understanding. Nothing further needed.

## 2018-06-30 ENCOUNTER — Ambulatory Visit: Payer: BLUE CROSS/BLUE SHIELD | Admitting: Pulmonary Disease

## 2018-06-30 ENCOUNTER — Encounter: Payer: Self-pay | Admitting: Pulmonary Disease

## 2018-06-30 VITALS — BP 126/72 | HR 71 | Ht 62.0 in | Wt 230.0 lb

## 2018-06-30 DIAGNOSIS — G4734 Idiopathic sleep related nonobstructive alveolar hypoventilation: Secondary | ICD-10-CM | POA: Diagnosis not present

## 2018-06-30 NOTE — Progress Notes (Signed)
Janet Le    [id number]    Apr 09, 1956  Primary Care Physician:Burns, Claudina Lick, MD  Referring Physician: Binnie Rail, MD [place], [place] [address]  Chief complaint:   History of obstructive sleep apnea Recurrence of symptoms of daytime sleepiness, insomnia Recent study was negative-had an in lab study performed  HPI: Denies any significant symptoms today Feels relatively well Nocturnal oximetry reveals nocturnal hypoxemia for which we are trying to get oxygen supplementation  Diagnoses obstructive sleep apnea in the past, did use CPAP in the past She did have repeat studies that were negative  usually tries to go to bed by 11 PM, wakes up a few times during the night He tries to get up in the morning about 10 AM She will start feeling sleepy probably about 2 PM Sleep is felt to be nonrestorative  Occasional dry mouth Occasional headaches Multiple family members with obstructive sleep apnea  Smoking history: Non-smoker  Outpatient Encounter Medications as of 06/30/2018  Medication Sig  . alendronate (FOSAMAX) 70 MG tablet TAKE 1 TABLET ONCE A WEEK WITH A FULL GLASS OF WATER ON AN EMPTY STOMACH  . AMBULATORY NON FORMULARY MEDICATION 1 Units by Other route daily. Diabetic Shoes  . amLODipine (NORVASC) 5 MG tablet TAKE 1 TABLET DAILY  . amoxicillin (AMOXIL) 500 MG tablet Take 2 grams 1 hour prior to dental procedure  . atorvastatin (LIPITOR) 10 MG tablet TAKE 1 TABLET DAILY  . blood glucose meter kit and supplies KIT Dispense based on patient and insurance preference. Use up to four times daily as directed. (FOR R73.03).  . calcium-vitamin D (OSCAL WITH D) 500-200 MG-UNIT tablet Take 1 tablet by mouth daily.   . collagenase (SANTYL) ointment Santyl 250 unit/gram topical ointment  . cromolyn (OPTICROM) 4 % ophthalmic solution cromolyn 4 % eye drops  . Diclofenac Sodium 2 % SOLN Apply 1 pump twice daily. (Patient taking differently: Apply 1  application topically 2 (two) times daily as needed (PAIN). Apply 1 pump twice daily.)  . escitalopram (LEXAPRO) 20 MG tablet escitalopram 20 mg tablet  . fluticasone (FLONASE) 50 MCG/ACT nasal spray Place 1 spray into both nostrils daily.  . furosemide (LASIX) 20 MG tablet Take 1 tablet (20 mg total) by mouth daily.  [name] lidocaine (LIDODERM) 5 % Place 1 patch onto the skin daily. Remove & Discard patch within 12 hours or as directed by MD  . Linaclotide (LINZESS) 145 MCG CAPS capsule Take 1 capsule (145 mcg total) by mouth daily. (Patient taking differently: Take 145 mcg by mouth daily as needed (CONSTIPATION). )  . Liraglutide -Weight Management (SAXENDA) 18 MG/3ML SOPN Saxenda 3 mg/0.5 mL (18 mg/3 mL) subcutaneous pen injector  . losartan (COZAAR) 100 MG tablet losartan 100 mg tablet  . methocarbamol (ROBAXIN) 500 MG tablet Take 1 tablet (500 mg total) by mouth every 6 (six) hours as needed for muscle spasms.  . NONFORMULARY OR COMPOUNDED ITEM [facility]  Onychomycosis Nail Lacquer -  Fluconazole 2%, Terbinafine 1% DMSO Apply to affected nail once daily Qty. 120 gm 3 refills  . ondansetron (ZOFRAN) 4 MG tablet Take 1-2 tablets (4-8 mg total) by mouth every 8 (eight) hours as needed for nausea or vomiting.  . ondansetron (ZOFRAN-ODT) 4 MG disintegrating tablet ondansetron 4 mg disintegrating tablet  . Prenatal Vit-Fe Fumarate-FA (PNV PRENATAL PLUS MULTIVITAMIN) 27-1 MG TABS TAKE 1 TABLET DAILY AT 12 NOON  . propranolol (INDERAL) 80 MG tablet  Take 1 tablet (80 mg total) by mouth 2 (two) times daily.  [name] telmisartan (MICARDIS) 80 MG tablet Take 1 tablet (80 mg total) by mouth daily.  [name] venlafaxine XR (EFFEXOR XR) 75 MG 24 hr capsule Take 1 capsule (75 mg total) by mouth daily with breakfast.  . zolpidem (AMBIEN) 10 MG tablet zolpidem 10 mg tablet  . [DISCONTINUED] lidocaine (LIDODERM) 5 % Place 1 patch onto the skin daily. Remove & Discard patch within 12 hours or as directed by MD  .  [DISCONTINUED] telmisartan (MICARDIS) 80 MG tablet Take 1 tablet (80 mg total) by mouth daily.   No facility-administered encounter medications on file as of 06/30/2018.     Allergies as of 06/30/2018 - Review Complete 06/16/2018  Allergen Reaction Noted  . Sertraline hcl Other (See Comments) 04/11/2011  . Ace inhibitors Cough 07/30/2012  . Codeine Palpitations   . Darifenacin hydrobromide er Other (See Comments) 05/14/2011  . Gabapentin Other (See Comments) 10/23/2011  . Pravastatin Other (See Comments) 01/06/2014  . Propoxyphene hcl Palpitations   . Wellbutrin [bupropion hcl] Other (See Comments) 01/10/2011    Past Medical History:  Diagnosis Date  . Allergic rhinitis, cause unspecified   . Anxiety   . Blood dyscrasia    platelets low in past  . Chronic back pain   . Chronic constipation   . Chronic sinusitis   . Depression   . Endometriosis   . ENDOMETRIOSIS 05/06/2009   Qualifier: Diagnosis of  By: Varney Daily RN, Butch Penny    . Esophageal stricture   . GERD (gastroesophageal reflux disease)   . Hemorrhoids   . Hiatal hernia   . Hyperlipidemia   . Hypertension   . Irritable bowel syndrome   . Lymphedema   . Osteoarthritis   . Personal history of diabetes mellitus    controlled with diet only  . Renal cell cancer ([place]) 11/2010 dx   R, s/p cryoablation 02/16/11  . Syncopal episodes    since childhood    Past Surgical History:  Procedure Laterality Date  . BREAST CYST EXCISION Bilateral over 10 years ago   No visable scar   . BREAST SURGERY     bilateral, cyst removal  . fallopian tube removed    . FUNCTIONAL ENDOSCOPIC SINUS SURGERY    . HERNIA REPAIR    . LASER ABLATION OF THE CERVIX    . 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 TOTAL KNEE ARTHROPLASTY Right 10/30/2016   Procedure: RIGHT TOTAL KNEE ARTHROPLASTY;  Surgeon: Meredith Pel, MD;  Location: [facility];  Service: Orthopedics;  Laterality: Right;  . TUBAL LIGATION      Family  History  Problem Relation Age of Onset  . Diabetes Mother   . Hypertension Mother   . Hyperlipidemia Mother   . Heart disease Mother   . Stroke Mother   . Kidney disease Mother   . Thyroid disease Mother   . Sleep apnea Mother   . Obesity Mother   . Kidney disease Father   . Prostate cancer Father   . Alcoholism Father   . Colitis Brother   . Leukemia Brother   . Multiple sclerosis Sister   . Colon polyps Other   . Breast cancer Maternal Aunt   . Breast cancer Cousin   . Colon cancer Neg Hx     Social History   Socioeconomic History  . Marital status: Legally Separated    Spouse name: Not on  file  . Number of children: Not on file  . Years of education: Not on file  . Highest education level: Not on file  Occupational History  . Occupation: Disabled  Social Needs  . Financial resource strain: Not on file  . Food insecurity:    Worry: Not on file    Inability: Not on file  . Transportation needs:    Medical: Not on file    Non-medical: Not on file  Tobacco Use  . Smoking status: Never Smoker  . Smokeless tobacco: Never Used  Substance and Sexual Activity  . Alcohol use: No    Comment: rare  . Drug use: No  . Sexual activity: Never  Lifestyle  . Physical activity:    Days per week: Not on file    Minutes per session: Not on file  . Stress: Not on file  Relationships  . Social connections:    Talks on phone: Not on file    Gets together: Not on file    Attends religious service: Not on file    Active member of club or organization: Not on file    Attends meetings of clubs or organizations: Not on file    Relationship status: Not on file  . Intimate partner violence:    Fear of current or ex partner: Not on file    Emotionally abused: Not on file    Physically abused: Not on file    Forced sexual activity: Not on file  Other Topics Concern  . Not on file  Social History Narrative  . Not on file    Review of Systems  Constitutional: Positive for  activity change and fatigue.  Respiratory: Positive for apnea.   Cardiovascular: Negative for chest pain and leg swelling.  Gastrointestinal: Negative.   Genitourinary: Negative.   Musculoskeletal: Positive for arthralgias.  Hematological: Negative.   Psychiatric/Behavioral: Positive for sleep disturbance.    There were no vitals filed for this visit.   Physical Exam  Constitutional: She appears well-developed and well-nourished.  HENT:  Head: Normocephalic and atraumatic.  Crowded oropharynx, Mallampati 4  Eyes: Pupils are equal, round, and reactive to light. Conjunctivae and EOM are normal. Right eye exhibits no discharge. Left eye exhibits no discharge.  Neck: Normal range of motion. Neck supple. No tracheal deviation present. No thyromegaly present.  Cardiovascular: Normal rate and regular rhythm.  Pulmonary/Chest: Effort normal and breath sounds normal. No respiratory distress. She has no wheezes.  Abdominal: Soft. Bowel sounds are normal. She exhibits no distension. There is no abdominal tenderness.  Neurological: She has normal reflexes.   Data Reviewed: Records reviewed Cardiogram from 2018 does not reveal any pulmonary hypertension  No flowsheet data found.    Assessment:  Negative study for obstructive sleep apnea  Nocturnal desaturations  Nonrestorative sleep  Excessive daytime sleepiness   Plan/Recommendations:  Oxygen supplementation at 3 L -We will repeat nocturnal oximetry to ascertain adequate supplementation  Importance of weight loss and physical activity discussed  I will see her back in the office in about 6 months  Encouraged to call if any significant concerns   Sherrilyn Rist MD [facility] 06/30/2018, 9:31 AM  CC: Binnie Rail, MD

## 2018-06-30 NOTE — Patient Instructions (Signed)
Nocturnal desaturations Requires home oxygen at 3L  Will start you on oxygen supplementation continue weight loss measures Will see you back in 83months

## 2018-07-02 ENCOUNTER — Telehealth: Payer: Self-pay | Admitting: Pulmonary Disease

## 2018-07-02 NOTE — Telephone Encounter (Signed)
Call made to patient, she is requesting to know when she will receive her oxygen. Confirmed which company patient would like her oxygen from, [facility]. Informed patient it would 2 weeks. [facility] to make [facility] aware they will be taking over oxygen order. Patient voices understanding. Nothing further is needed at this time.

## 2018-07-02 NOTE — Telephone Encounter (Signed)
I have sent a stat message to [facility]

## 2018-07-03 ENCOUNTER — Ambulatory Visit (HOSPITAL_COMMUNITY): Payer: BLUE CROSS/BLUE SHIELD | Admitting: Psychiatry

## 2018-07-03 DIAGNOSIS — K219 Gastro-esophageal reflux disease without esophagitis: Secondary | ICD-10-CM

## 2018-07-03 DIAGNOSIS — H9311 Tinnitus, right ear: Secondary | ICD-10-CM

## 2018-07-03 HISTORY — DX: Tinnitus, right ear: H93.11

## 2018-07-03 HISTORY — DX: Gastro-esophageal reflux disease without esophagitis: K21.9

## 2018-07-08 ENCOUNTER — Telehealth: Payer: Self-pay | Admitting: Internal Medicine

## 2018-07-08 DIAGNOSIS — R600 Localized edema: Secondary | ICD-10-CM

## 2018-07-08 NOTE — Telephone Encounter (Signed)
Rx for knee high and thigh high stockings have been faxed over to [facility]. Pt is aware.

## 2018-07-08 NOTE — Telephone Encounter (Signed)
Copied from [place] [id number]. Topic: Quick Communication - See Telephone Encounter >> Jul 08, 2018  9:06 AM Bea Graff, NT wrote: CRM for notification. See Telephone encounter for: 07/08/18. Pt states she needs a new order for compression socks sent to [facility] at [employer] on [address] Please advise pt once sent. She states she needs knee high and thigh high stockings. [facility] phone #: [phone].

## 2018-07-10 ENCOUNTER — Telehealth: Payer: Self-pay | Admitting: Neurology

## 2018-07-10 NOTE — Telephone Encounter (Signed)
Patient  needs a new RX for the Diabetic shoes RX fax to [phone] attn Hinton Dyer

## 2018-07-11 ENCOUNTER — Other Ambulatory Visit: Payer: Self-pay

## 2018-07-11 MED ORDER — AMBULATORY NON FORMULARY MEDICATION: 1.0000 [IU] | Freq: Every day | 0 refills | Status: DC

## 2018-07-11 MED ORDER — AMBULATORY NON FORMULARY MEDICATION: 0 refills | Status: DC

## 2018-07-11 NOTE — Telephone Encounter (Signed)
I relayed msg to the patient below. Thanks!

## 2018-07-11 NOTE — Telephone Encounter (Signed)
Printed Rx, will fax when Dr Tomi Likens signs

## 2018-07-14 ENCOUNTER — Ambulatory Visit: Payer: Self-pay | Admitting: Internal Medicine

## 2018-07-23 ENCOUNTER — Ambulatory Visit: Payer: Self-pay | Admitting: Internal Medicine

## 2018-07-25 ENCOUNTER — Ambulatory Visit: Payer: Self-pay | Admitting: Pulmonary Disease

## 2018-07-25 DIAGNOSIS — Z9013 Acquired absence of bilateral breasts and nipples: Secondary | ICD-10-CM | POA: Diagnosis not present

## 2018-07-26 ENCOUNTER — Ambulatory Visit (HOSPITAL_COMMUNITY): Payer: BLUE CROSS/BLUE SHIELD | Admitting: Psychiatry

## 2018-07-31 ENCOUNTER — Ambulatory Visit (HOSPITAL_COMMUNITY): Payer: BLUE CROSS/BLUE SHIELD | Admitting: Psychiatry

## 2018-08-04 DIAGNOSIS — C641 Malignant neoplasm of right kidney, except renal pelvis: Secondary | ICD-10-CM | POA: Diagnosis not present

## 2018-08-11 ENCOUNTER — Telehealth: Payer: Self-pay | Admitting: Neurology

## 2018-08-11 NOTE — Telephone Encounter (Signed)
Called and spoke with Pt. She was calling concerning the Rx for diabetic shoes. There was a packet that was faxed to [place] to be filled out. I explained to her that Dr. Tomi Likens did agree to write the Rx for the shoes, but he does not follow her for her diabetes and if they require that much detailed information, it will need to come from either the PCP or her endocrinologist. Pt verbalized understanding.

## 2018-08-11 NOTE — Telephone Encounter (Signed)
Patient left vm wanting to speak with you. Please call her back. Thanks!

## 2018-08-12 ENCOUNTER — Ambulatory Visit (INDEPENDENT_AMBULATORY_CARE_PROVIDER_SITE_OTHER): Payer: BLUE CROSS/BLUE SHIELD | Admitting: Psychiatry

## 2018-08-12 ENCOUNTER — Encounter (HOSPITAL_COMMUNITY): Payer: Self-pay | Admitting: Psychiatry

## 2018-08-12 VITALS — BP 122/79 | HR 67 | Ht 62.0 in | Wt 230.0 lb

## 2018-08-12 DIAGNOSIS — F4312 Post-traumatic stress disorder, chronic: Secondary | ICD-10-CM | POA: Diagnosis not present

## 2018-08-12 DIAGNOSIS — F4310 Post-traumatic stress disorder, unspecified: Secondary | ICD-10-CM | POA: Insufficient documentation

## 2018-08-12 DIAGNOSIS — F341 Dysthymic disorder: Secondary | ICD-10-CM | POA: Diagnosis not present

## 2018-08-12 MED ORDER — ZOLPIDEM TARTRATE 5 MG PO TABS: 5.0000 mg | ORAL_TABLET | Freq: Every evening | ORAL | 0 refills | Status: DC | PRN

## 2018-08-12 MED ORDER — VENLAFAXINE HCL ER 150 MG PO CP24: 150.0000 mg | ORAL_CAPSULE | Freq: Every day | ORAL | 0 refills | Status: DC
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
## 2018-08-12 NOTE — Patient Instructions (Signed)
Plan:  1. We are increasing dose of venlafaxine (Effexor) to 150 mg. Take please two capsules of 75 mg each which you have now. In the morning with food. Once you run out of these you have a new prescription for 150 mg strength capsules. Take one of these in AM next.  2. Zolpidem (Ambien)  Take at bedtime as needed for sleep.

## 2018-08-22 DIAGNOSIS — C641 Malignant neoplasm of right kidney, except renal pelvis: Secondary | ICD-10-CM | POA: Diagnosis not present

## 2018-08-22 DIAGNOSIS — C649 Malignant neoplasm of unspecified kidney, except renal pelvis: Secondary | ICD-10-CM | POA: Diagnosis not present

## 2018-08-25 ENCOUNTER — Ambulatory Visit: Payer: Self-pay | Admitting: Internal Medicine

## 2018-09-01 DIAGNOSIS — G4733 Obstructive sleep apnea (adult) (pediatric): Secondary | ICD-10-CM | POA: Diagnosis not present

## 2018-09-01 DIAGNOSIS — R092 Respiratory arrest: Secondary | ICD-10-CM | POA: Diagnosis not present

## 2018-09-01 NOTE — Progress Notes (Signed)
Subjective:    Patient ID: Janet Le, female    DOB: 1955-10-13, 63 y.o.   MRN: [id number]  HPI The patient is here for follow up.  Anxiety, depression: three months ago we tapered her off cymbalta and started effexor. She established with Dr Montel Culver and he increased the medication to 150 mg daily. She has a follow up with him. She is taking her medication daily as prescribed. She denies any side effects from the medication. She feels the medication has helped.  She wonders if she needs a higher dose.     Gi referral:  She has hemorrhoids and they bleed and hurt intermittently.  The bleeding is mild.  She uses ointment as needed and it helped.  She is interested in seeing GI    Prediabetes, neuropathy:  She has prediabetes and has neuropathy of both feet.  She has numbness/tingling and pain on the plantar surface of her feet.  She was given diabetic shoes and needs another pair.  The shoes have helped her foot pain.    Hypertension: She is taking her medication daily. She is compliant with a low sodium diet.  She denies chest pain, palpitations, shortness of breath and regular headaches. She is not exercising regularly.  She does not monitor her blood pressure at home.     Medications and allergies reviewed with patient and updated if appropriate.  Patient Active Problem List   Diagnosis Date Noted  . Dysthymic disorder 08/12/2018  . Chronic post-traumatic stress disorder (PTSD) 08/12/2018  . Bilateral leg edema 06/12/2018  . Insulin resistance 04/17/2018  . Fatigue 11/14/2017  . Chronic back pain 11/14/2017  . Arthralgia of hip 11/14/2017  . Memory difficulties 10/08/2017  . Anxiety and depression 10/08/2017  . Neuropathy 10/07/2017  . LVH (left ventricular hypertrophy), mild 05/18/2017  . Arthritis of knee 10/30/2016  . S/P knee surgery 10/30/2016  . Burning pain 10/15/2016  . Chronic low back pain 07/18/2016  . Prediabetes 05/30/2016  . Cervicalgia 05/30/2016  .  Right knee pain 05/30/2016  . Hair loss 05/30/2016  . Osteopenia 11/18/2015  . Thrombocytopenia ([place]) 05/30/2015  . Arthritis of left lower extremity 02/02/2015  . Bilateral knee pain 09/08/2014  . Gout 12/11/2013  . Obesity with serious comorbidity 11/06/2013  . Cough 07/15/2012  . Left lumbar radiculopathy 04/13/2012  . Renal cell cancer ([place])   . External hemorrhoid 11/14/2010  . IBS (irritable bowel syndrome) 11/14/2010  . GERD (gastroesophageal reflux disease) 11/14/2010  . HYPERCHOLESTEROLEMIA, MILD 03/16/2010  . Lymphedema 03/15/2010  . ALLERGIC RHINITIS 08/31/2009  . MIGRAINE HEADACHE 06/14/2009  . SINUSITIS, CHRONIC 06/14/2009  . SYNCOPE 05/16/2009  . Essential hypertension 05/06/2009  . CONSTIPATION, CHRONIC 05/06/2009    Current Outpatient Medications on File Prior to Visit  Medication Sig Dispense Refill  . alendronate (FOSAMAX) 70 MG tablet TAKE 1 TABLET ONCE A WEEK WITH A FULL GLASS OF WATER ON AN EMPTY STOMACH 12 tablet 1  . AMBULATORY NON FORMULARY MEDICATION Medication Name: Diabetic Shoes 1 Device 0  . AMBULATORY NON FORMULARY MEDICATION 1 Units by Other route daily. Diabetic Shoes 1 Units 0  . amLODipine (NORVASC) 5 MG tablet TAKE 1 TABLET DAILY 90 tablet 0  . amoxicillin (AMOXIL) 500 MG tablet Take 2 grams 1 hour prior to dental procedure 16 tablet 0  . atorvastatin (LIPITOR) 10 MG tablet TAKE 1 TABLET DAILY 90 tablet 1  . blood glucose meter kit and supplies KIT Dispense based on patient and insurance preference.  Use up to four times daily as directed. (FOR R73.03). 1 each 0  . calcium-vitamin D (OSCAL WITH D) 500-200 MG-UNIT tablet Take 1 tablet by mouth daily.     . collagenase (SANTYL) ointment Santyl 250 unit/gram topical ointment    . cromolyn (OPTICROM) 4 % ophthalmic solution cromolyn 4 % eye drops    . Diclofenac Sodium 2 % SOLN Apply 1 pump twice daily. (Patient taking differently: Apply 1 application topically 2 (two) times daily as needed (PAIN).  Apply 1 pump twice daily.) 112 g 1  . fluticasone (FLONASE) 50 MCG/ACT nasal spray Place 1 spray into both nostrils daily. 16 g 2  . furosemide (LASIX) 20 MG tablet Take 1 tablet (20 mg total) by mouth daily. 90 tablet 1  . lidocaine (LIDODERM) 5 % Place 1 patch onto the skin daily. Remove & Discard patch within 12 hours or as directed by MD 90 patch 1  . Linaclotide (LINZESS) 145 MCG CAPS capsule Take 1 capsule (145 mcg total) by mouth daily. (Patient taking differently: Take 145 mcg by mouth daily as needed (CONSTIPATION). ) 90 capsule 1  . Liraglutide -Weight Management (SAXENDA) 18 MG/3ML SOPN Saxenda 3 mg/0.5 mL (18 mg/3 mL) subcutaneous pen injector    . losartan (COZAAR) 100 MG tablet losartan 100 mg tablet    . methocarbamol (ROBAXIN) 500 MG tablet Take 1 tablet (500 mg total) by mouth every 6 (six) hours as needed for muscle spasms. 30 tablet 2  . NONFORMULARY OR COMPOUNDED ITEM [facility]  Onychomycosis Nail Lacquer -  Fluconazole 2%, Terbinafine 1% DMSO Apply to affected nail once daily Qty. 120 gm 3 refills 1 each 2  . omeprazole (PRILOSEC) 20 MG capsule TAKE 1 CAPSULE BY MOUTH DAILY FOR 30 DAYS TAKE FIRST THING IN THE MORNING AND DO NOT EAT FOR 1 HOUR    . ondansetron (ZOFRAN) 4 MG tablet Take 1-2 tablets (4-8 mg total) by mouth every 8 (eight) hours as needed for nausea or vomiting. 40 tablet 0  . Prenatal Vit-Fe Fumarate-FA (PNV PRENATAL PLUS MULTIVITAMIN) 27-1 MG TABS TAKE 1 TABLET DAILY AT 12 NOON 60 tablet 3  . propranolol (INDERAL) 80 MG tablet Take 1 tablet (80 mg total) by mouth 2 (two) times daily. 180 tablet 1  . telmisartan (MICARDIS) 80 MG tablet Take 1 tablet (80 mg total) by mouth daily. 90 tablet 1  . venlafaxine XR (EFFEXOR-XR) 150 MG 24 hr capsule Take 1 capsule (150 mg total) by mouth daily with breakfast for 30 days. 30 capsule 0  . zolpidem (AMBIEN) 5 MG tablet Take 1 tablet (5 mg total) by mouth at bedtime as needed for up to 30 days for sleep. 30 tablet 0    No current facility-administered medications on file prior to visit.     Past Medical History:  Diagnosis Date  . Allergic rhinitis, cause unspecified   . Anxiety   . Blood dyscrasia    platelets low in past  . Chronic back pain   . Chronic constipation   . Chronic sinusitis   . Depression   . Endometriosis   . ENDOMETRIOSIS 05/06/2009   Qualifier: Diagnosis of  By: Varney Daily RN, Butch Penny    . Esophageal stricture   . GERD (gastroesophageal reflux disease)   . Hemorrhoids   . Hiatal hernia   . Hyperlipidemia   . Hypertension   . Irritable bowel syndrome   . Lymphedema   . Osteoarthritis   . Personal history of diabetes mellitus  controlled with diet only  . Renal cell cancer ([place]) 11/2010 dx   R, s/p cryoablation 02/16/11  . Syncopal episodes    since childhood    Past Surgical History:  Procedure Laterality Date  . BREAST CYST EXCISION Bilateral over 10 years ago   No visable scar   . BREAST SURGERY     bilateral, cyst removal  . fallopian tube removed    . FUNCTIONAL ENDOSCOPIC SINUS SURGERY    . HERNIA REPAIR    . LASER ABLATION OF THE CERVIX    . 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 TOTAL KNEE ARTHROPLASTY Right 10/30/2016   Procedure: RIGHT TOTAL KNEE ARTHROPLASTY;  Surgeon: Meredith Pel, MD;  Location: [place];  Service: Orthopedics;  Laterality: Right;  . TUBAL LIGATION      Social History   Socioeconomic History  . Marital status: Legally Separated    Spouse name: Not on file  . Number of children: Not on file  . Years of education: Not on file  . Highest education level: Not on file  Occupational History  . Occupation: Disabled  Social Needs  . Financial resource strain: Not on file  . Food insecurity:    Worry: Not on file    Inability: Not on file  . Transportation needs:    Medical: Not on file    Non-medical: Not on file  Tobacco Use  . Smoking status: Never Smoker  . Smokeless tobacco: Never Used    Substance and Sexual Activity  . Alcohol use: No    Comment: rare  . Drug use: No  . Sexual activity: Never  Lifestyle  . Physical activity:    Days per week: Not on file    Minutes per session: Not on file  . Stress: Not on file  Relationships  . Social connections:    Talks on phone: Not on file    Gets together: Not on file    Attends religious service: Not on file    Active member of club or organization: Not on file    Attends meetings of clubs or organizations: Not on file    Relationship status: Not on file  Other Topics Concern  . Not on file  Social History Narrative  . Not on file    Family History  Problem Relation Age of Onset  . Diabetes Mother   . Hypertension Mother   . Hyperlipidemia Mother   . Heart disease Mother   . Stroke Mother   . Kidney disease Mother   . Thyroid disease Mother   . Sleep apnea Mother   . Obesity Mother   . Kidney disease Father   . Prostate cancer Father   . Alcoholism Father   . Alcohol abuse Father   . Colitis Brother   . Alcohol abuse Brother   . Leukemia Brother   . Multiple sclerosis Sister   . Colon polyps Other   . Breast cancer Maternal Aunt   . Breast cancer Cousin   . Alcohol abuse Daughter   . Colon cancer Neg Hx     Review of Systems  Constitutional: Negative for chills and fever.  Respiratory: Negative for shortness of breath.   Cardiovascular: Positive for leg swelling. Negative for chest pain (intermittent left sided - chronic for years) and palpitations.  Neurological: Positive for numbness (N/T b/l plantar surfaces). Negative for headaches.  Psychiatric/Behavioral: Positive for sleep disturbance (controlled with ambien).  Objective:   Vitals:   09/02/18 1345  BP: 134/82  Pulse: 60  Resp: 16  Temp: 98.7 F (37.1 C)  SpO2: 99%   BP Readings from Last 3 Encounters:  09/02/18 134/82  06/30/18 126/72  06/16/18 110/78   Wt Readings from Last 3 Encounters:  09/02/18 233 lb 12.8 oz  (106.1 kg)  06/30/18 230 lb (104.3 kg)  06/16/18 226 lb 8 oz (102.7 kg)   Body mass index is 42.76 kg/m.   Physical Exam    Constitutional: Appears well-developed and well-nourished. No distress.  HENT:  Head: Normocephalic and atraumatic.  Neck: Neck supple. No tracheal deviation present. No thyromegaly present.  No cervical lymphadenopathy Cardiovascular: Normal rate, regular rhythm and normal heart sounds.   No murmur heard. No carotid bruit .  No edema Pulmonary/Chest: Effort normal and breath sounds normal. No respiratory distress. No has no wheezes. No rales.  Skin: Skin is warm and dry. Not diaphoretic.  Psychiatric: Normal mood and affect. Behavior is normal.     Foot Exam - Simple  - prediabetes  Simple Foot Form Diabetic Foot exam was performed with the following findings:  Yes 09/02/2018  2:20 PM  Visual Inspection No deformities, no ulcerations, no other skin breakdown bilaterally:  Yes See comments:  Yes Sensation Testing Intact to touch and monofilament testing bilaterally:  Yes Pulse Check Posterior Tibialis and Dorsalis pulse intact bilaterally:  Yes Comments Flat feet, dry skin w/o breaks in skin       Assessment & Plan:    See Problem List for Assessment and Plan of chronic medical problems.

## 2018-09-02 ENCOUNTER — Encounter: Payer: Self-pay | Admitting: Internal Medicine

## 2018-09-02 ENCOUNTER — Ambulatory Visit: Payer: BLUE CROSS/BLUE SHIELD | Admitting: Internal Medicine

## 2018-09-02 VITALS — BP 134/82 | HR 60 | Temp 98.7°F | Resp 16 | Ht 62.0 in | Wt 233.8 lb

## 2018-09-02 DIAGNOSIS — F419 Anxiety disorder, unspecified: Secondary | ICD-10-CM

## 2018-09-02 DIAGNOSIS — R7303 Prediabetes: Secondary | ICD-10-CM

## 2018-09-02 DIAGNOSIS — K649 Unspecified hemorrhoids: Secondary | ICD-10-CM | POA: Diagnosis not present

## 2018-09-02 DIAGNOSIS — I10 Essential (primary) hypertension: Secondary | ICD-10-CM

## 2018-09-02 DIAGNOSIS — G629 Polyneuropathy, unspecified: Secondary | ICD-10-CM

## 2018-09-02 DIAGNOSIS — F329 Major depressive disorder, single episode, unspecified: Secondary | ICD-10-CM

## 2018-09-02 DIAGNOSIS — F32A Depression, unspecified: Secondary | ICD-10-CM

## 2018-09-02 NOTE — Assessment & Plan Note (Signed)
Lab Results  Component Value Date   HGBA1C 5.5 03/05/2018   Sugars have been controlled  - will check at next visit

## 2018-09-02 NOTE — Assessment & Plan Note (Signed)
Anxiety and depression better Established with Dr Montel Culver - I will let him manage medication -- advised her it is too soon to increase medications

## 2018-09-02 NOTE — Assessment & Plan Note (Signed)
EMG 10/2016 showed no large fiber neuropathy, symptoms c/w neuropathy Has seen neurology Would benefit from diabetic shoes

## 2018-09-02 NOTE — Assessment & Plan Note (Signed)
BP well controlled Current regimen effective and well tolerated Continue current medications at current doses  

## 2018-09-02 NOTE — Assessment & Plan Note (Signed)
Chronic, intermittent bleeding and pain Wants to see GI - referred

## 2018-09-02 NOTE — Patient Instructions (Signed)
  Medications reviewed and updated.  Changes include :   none   A referral was ordered for GI   Please followup in 6 months

## 2018-09-09 ENCOUNTER — Ambulatory Visit (HOSPITAL_COMMUNITY): Payer: BLUE CROSS/BLUE SHIELD | Admitting: Psychiatry

## 2018-09-11 ENCOUNTER — Telehealth (HOSPITAL_COMMUNITY): Payer: Self-pay

## 2018-09-11 ENCOUNTER — Other Ambulatory Visit (HOSPITAL_COMMUNITY): Payer: Self-pay | Admitting: Psychiatry

## 2018-09-11 MED ORDER — ZOLPIDEM TARTRATE 10 MG PO TABS: 10.0000 mg | ORAL_TABLET | Freq: Every evening | ORAL | 0 refills | Status: DC | PRN

## 2018-09-11 NOTE — Telephone Encounter (Signed)
Patient is calling, she said that the 5 mg Ambien was not working so she started taking 2 and that works. Patient would like [facility] 10 mg Ambien and a 90 day supply. Please review and advise, thank you

## 2018-09-11 NOTE — Telephone Encounter (Signed)
[facility]  OP

## 2018-09-18 ENCOUNTER — Other Ambulatory Visit: Payer: Self-pay | Admitting: Internal Medicine

## 2018-09-19 ENCOUNTER — Other Ambulatory Visit (HOSPITAL_COMMUNITY): Payer: Self-pay | Admitting: Psychiatry

## 2018-09-19 ENCOUNTER — Other Ambulatory Visit: Payer: Self-pay

## 2018-09-19 MED ORDER — PROPRANOLOL HCL 80 MG PO TABS: 80.0000 mg | ORAL_TABLET | Freq: Two times a day (BID) | ORAL | 1 refills | Status: DC

## 2018-10-01 ENCOUNTER — Telehealth (HOSPITAL_COMMUNITY): Payer: Self-pay

## 2018-10-01 NOTE — Telephone Encounter (Signed)
Medication refill request - Fax received from [employer] at [facility] for a refill of Venlafaxine ER 150 mg, last provided 30 day order on 08/12/18.  Fax left in provider box.

## 2018-10-02 ENCOUNTER — Other Ambulatory Visit (HOSPITAL_COMMUNITY): Payer: Self-pay | Admitting: Psychiatry

## 2018-10-02 DIAGNOSIS — G4733 Obstructive sleep apnea (adult) (pediatric): Secondary | ICD-10-CM | POA: Diagnosis not present

## 2018-10-02 DIAGNOSIS — R0902 Hypoxemia: Secondary | ICD-10-CM | POA: Diagnosis not present

## 2018-10-02 MED ORDER — VENLAFAXINE HCL ER 150 MG PO CP24: 150.0000 mg | ORAL_CAPSULE | Freq: Every day | ORAL | 0 refills | Status: DC

## 2018-10-02 NOTE — Telephone Encounter (Signed)
I send one month refill on venlafaxine to [facility].  OP

## 2018-10-02 NOTE — Telephone Encounter (Signed)
Medication management - Telephone call with pt to inform Dr. Montel Culver had sent in her needed refill of Effexor XR.  Patient questions if can be increased as states is not helping a lot.  Agreed to send question to Dr. Montel Culver but also requested patient rescheduled missed appointment.  Patient concerned about coming into the office currently with COVID-19 and informed they could set up a virtual video or telephone call with her.  Patient to schedule this appt and sending concern to Dr. Montel Culver as patient requested.

## 2018-10-03 ENCOUNTER — Other Ambulatory Visit (HOSPITAL_COMMUNITY): Payer: Self-pay | Admitting: Psychiatry

## 2018-10-03 MED ORDER — VENLAFAXINE HCL ER 75 MG PO CP24: 225.0000 mg | ORAL_CAPSULE | Freq: Every day | ORAL | 0 refills | Status: DC

## 2018-10-03 NOTE — Telephone Encounter (Signed)
I spoke with patient about limited benefit from Effexor. Still anxious and somewhat depressed. We will increase dose to 225 mg first then maybe  Start thinking about augmentation. She has not started counseling yet due to stay home advise related to corona virus.

## 2018-10-07 ENCOUNTER — Other Ambulatory Visit (HOSPITAL_COMMUNITY): Payer: Self-pay

## 2018-10-07 MED ORDER — VENLAFAXINE HCL ER 75 MG PO CP24: 225.0000 mg | ORAL_CAPSULE | Freq: Every day | ORAL | 0 refills | Status: DC

## 2018-10-08 ENCOUNTER — Ambulatory Visit: Payer: Self-pay | Admitting: Neurology

## 2018-10-09 IMAGING — MG DIGITAL SCREENING BILATERAL MAMMOGRAM WITH CAD
4 series · 4 of 4 positions shown · non-contrast
Comparison: Previous exam(s).

CLINICAL DATA: Screening.

EXAM:
DIGITAL SCREENING BILATERAL MAMMOGRAM WITH CAD

[L MLO]
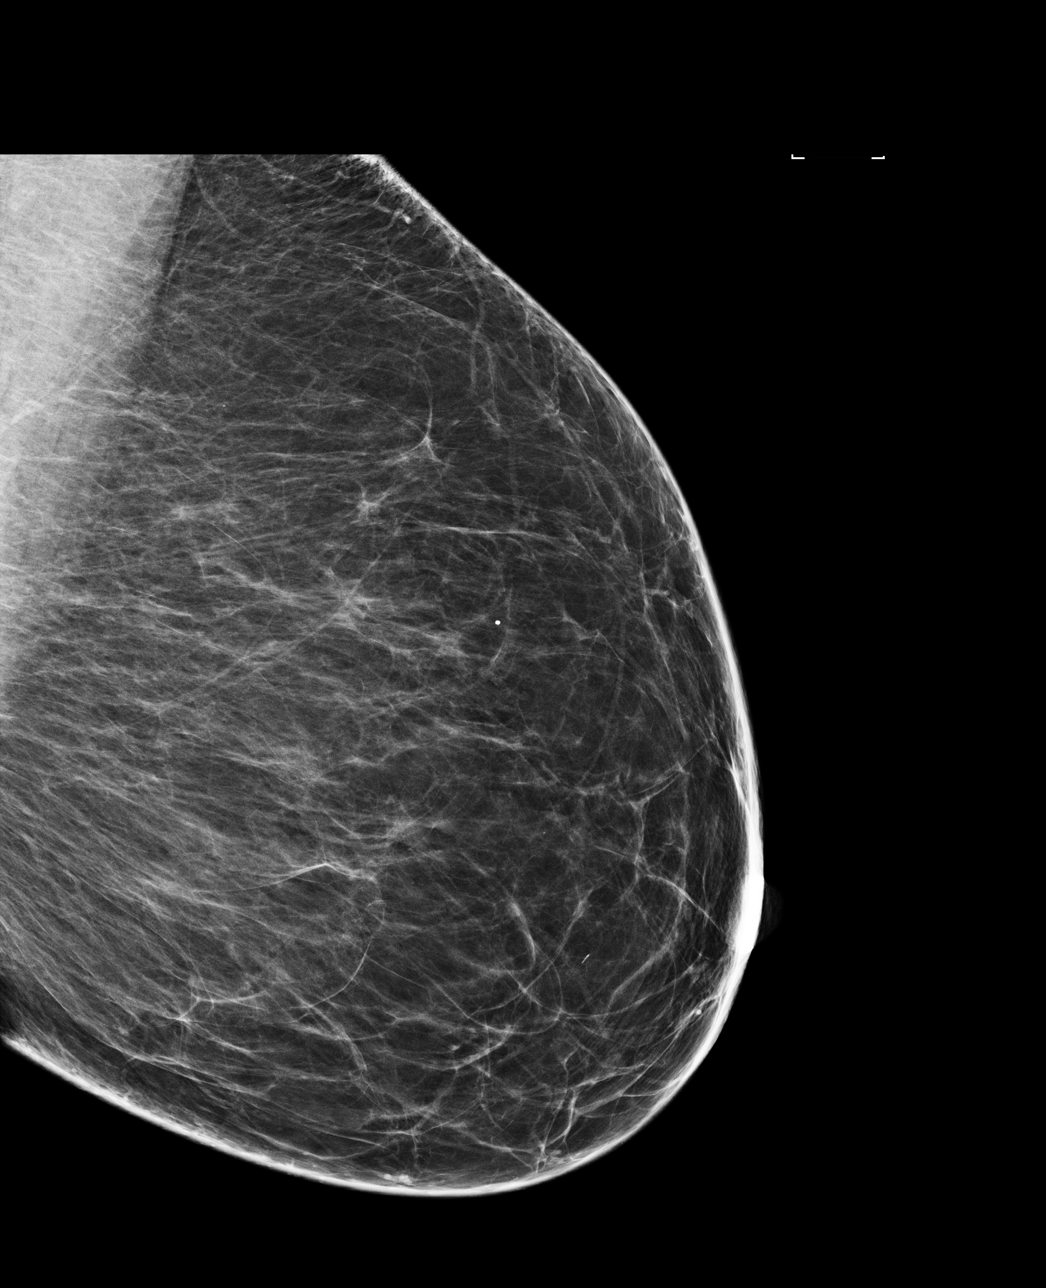

[L CC]
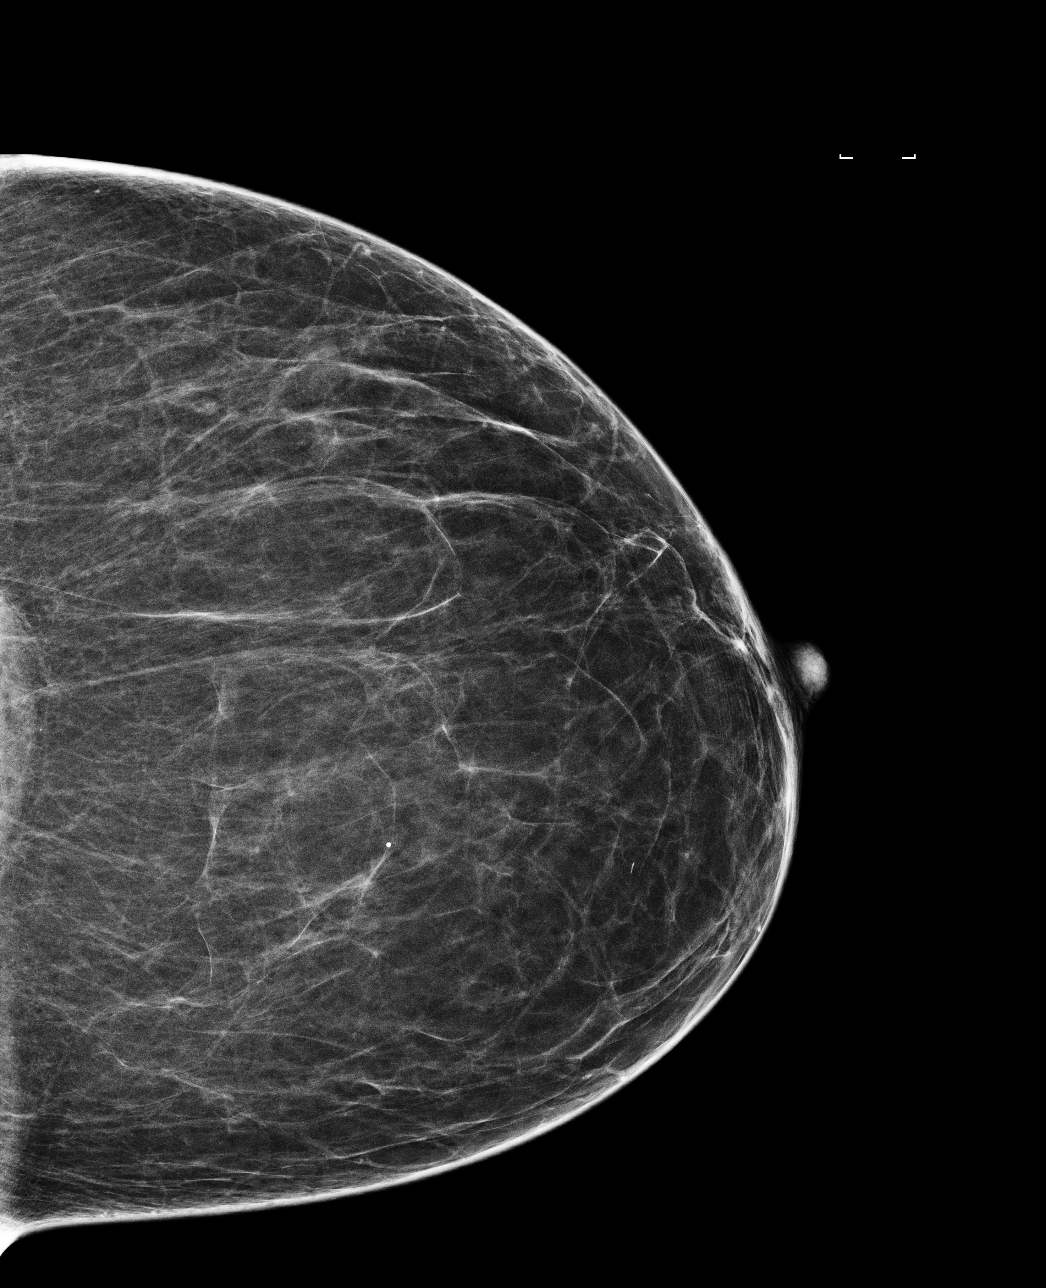

[R MLO]
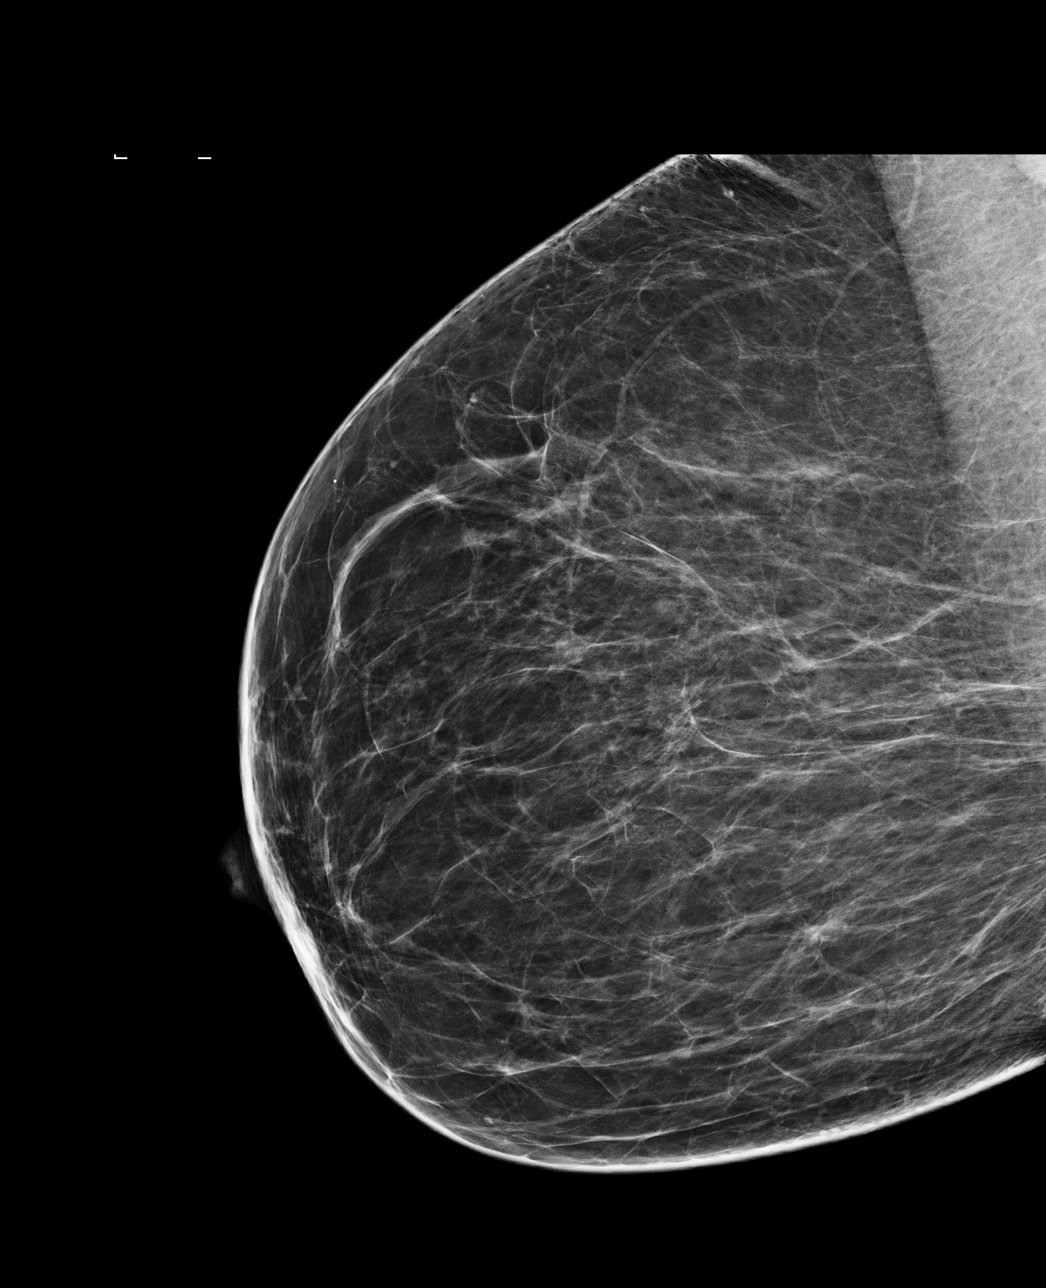

[R CC]
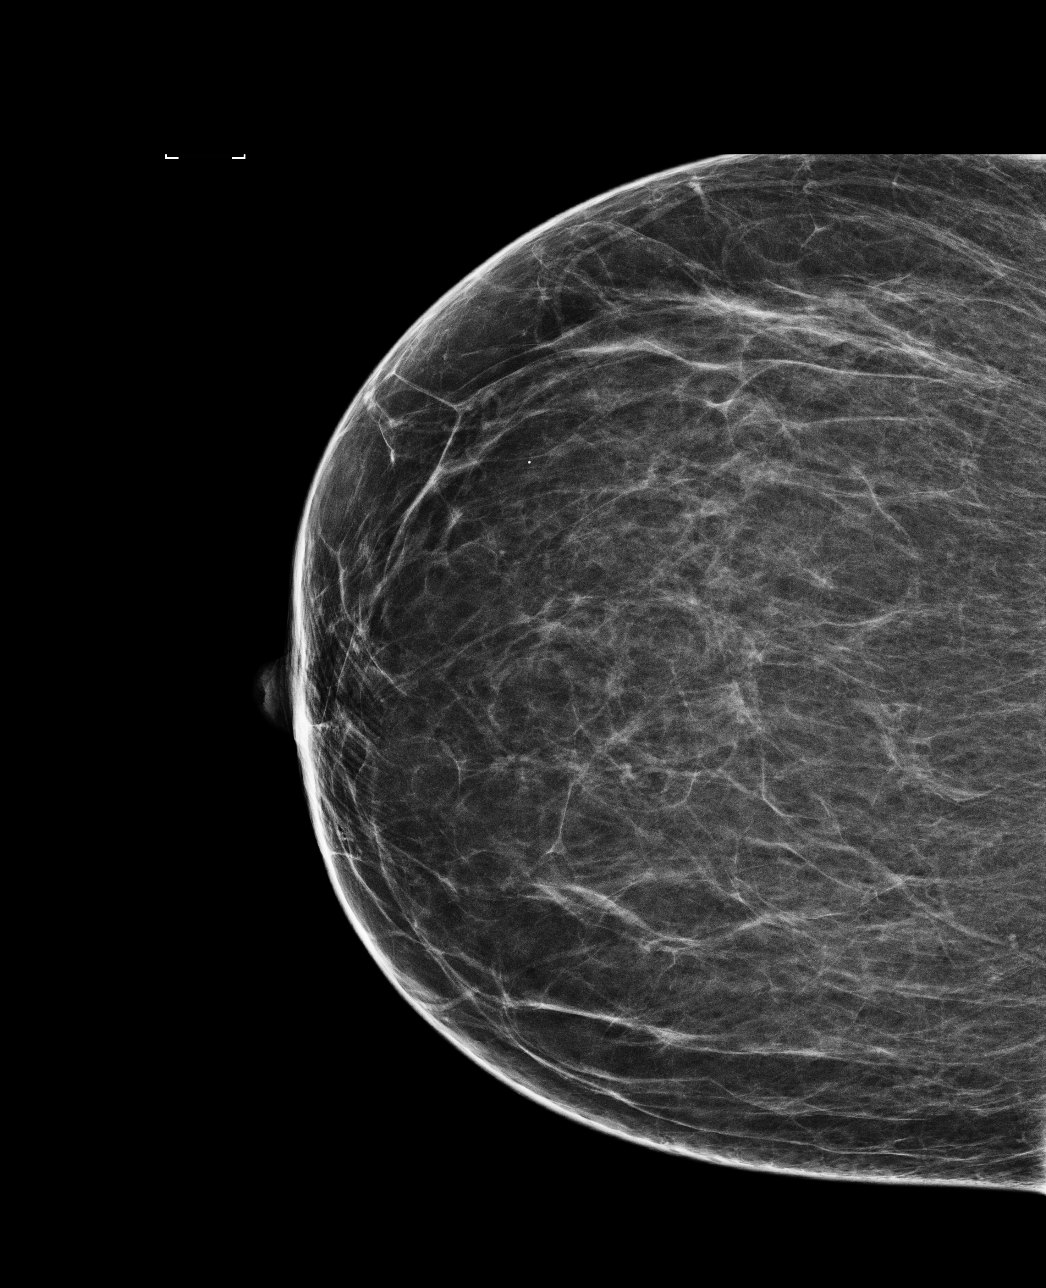

[4 of 4 positions shown; findings below may reference images not displayed]

ACR Breast Density Category b: There are scattered areas of
fibroglandular density.
FINDINGS: There are no findings suspicious for malignancy. Images were
processed with CAD.
IMPRESSION: No mammographic evidence of malignancy. A result letter of this
screening mammogram will be mailed directly to the patient.

RECOMMENDATION:
Screening mammogram in one year. (Code:AS-G-LCT)

BI-RADS CATEGORY  1: Negative.

## 2018-10-14 ENCOUNTER — Ambulatory Visit (HOSPITAL_COMMUNITY): Payer: BLUE CROSS/BLUE SHIELD | Admitting: Licensed Clinical Social Worker

## 2018-10-14 ENCOUNTER — Telehealth (HOSPITAL_COMMUNITY): Payer: Self-pay | Admitting: Licensed Clinical Social Worker

## 2018-10-14 ENCOUNTER — Other Ambulatory Visit: Payer: Self-pay

## 2018-10-14 NOTE — Telephone Encounter (Signed)
Called and emailed pt about upcoming assessment appointment. No response from either phone call or email.  Alver Fisher, LCAS

## 2018-10-27 ENCOUNTER — Ambulatory Visit (HOSPITAL_COMMUNITY): Payer: BLUE CROSS/BLUE SHIELD | Admitting: Psychiatry

## 2018-10-29 ENCOUNTER — Other Ambulatory Visit: Payer: Self-pay

## 2018-10-29 ENCOUNTER — Ambulatory Visit (INDEPENDENT_AMBULATORY_CARE_PROVIDER_SITE_OTHER): Payer: BLUE CROSS/BLUE SHIELD | Admitting: Licensed Clinical Social Worker

## 2018-10-29 ENCOUNTER — Encounter (HOSPITAL_COMMUNITY): Payer: Self-pay | Admitting: Licensed Clinical Social Worker

## 2018-10-29 DIAGNOSIS — F4312 Post-traumatic stress disorder, chronic: Secondary | ICD-10-CM

## 2018-10-29 DIAGNOSIS — F341 Dysthymic disorder: Secondary | ICD-10-CM
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
## 2018-11-01 DIAGNOSIS — G4733 Obstructive sleep apnea (adult) (pediatric): Secondary | ICD-10-CM | POA: Diagnosis not present

## 2018-11-01 DIAGNOSIS — R0902 Hypoxemia: Secondary | ICD-10-CM | POA: Diagnosis not present

## 2018-11-04 ENCOUNTER — Other Ambulatory Visit (HOSPITAL_COMMUNITY): Payer: Self-pay | Admitting: Psychiatry

## 2018-11-20 ENCOUNTER — Telehealth (HOSPITAL_COMMUNITY): Payer: Self-pay | Admitting: Psychiatry

## 2018-11-24 ENCOUNTER — Ambulatory Visit (HOSPITAL_COMMUNITY): Payer: BLUE CROSS/BLUE SHIELD | Admitting: Psychiatry

## 2018-11-24 ENCOUNTER — Other Ambulatory Visit: Payer: Self-pay

## 2018-12-10 ENCOUNTER — Other Ambulatory Visit: Payer: Self-pay | Admitting: Internal Medicine

## 2018-12-10 ENCOUNTER — Other Ambulatory Visit (HOSPITAL_COMMUNITY): Payer: Self-pay | Admitting: Psychiatry

## 2018-12-11 ENCOUNTER — Other Ambulatory Visit: Payer: Self-pay | Admitting: Internal Medicine

## 2018-12-11 NOTE — Telephone Encounter (Signed)
Yes - she should not be on duloxetine - this should be cancelled

## 2018-12-11 NOTE — Telephone Encounter (Signed)
Duloxetine was dc'd due to "not effective" on 06/02/18. Looks like she takes Effexor instead?

## 2018-12-11 NOTE — Telephone Encounter (Signed)
Duloxetine 60 mg rx cancelled at [facility].

## 2018-12-12 ENCOUNTER — Ambulatory Visit (INDEPENDENT_AMBULATORY_CARE_PROVIDER_SITE_OTHER): Payer: Medicare Other | Admitting: Psychiatry

## 2018-12-12 ENCOUNTER — Other Ambulatory Visit: Payer: Self-pay

## 2018-12-12 DIAGNOSIS — F4312 Post-traumatic stress disorder, chronic: Secondary | ICD-10-CM | POA: Diagnosis not present

## 2018-12-12 DIAGNOSIS — F341 Dysthymic disorder: Secondary | ICD-10-CM | POA: Diagnosis not present

## 2018-12-12 MED ORDER — VENLAFAXINE HCL ER 75 MG PO CP24: ORAL_CAPSULE | ORAL | 0 refills | Status: DC

## 2018-12-12 MED ORDER — METHYLPHENIDATE HCL 5 MG PO TABS: 5.0000 mg | ORAL_TABLET | Freq: Two times a day (BID) | ORAL | 0 refills | Status: DC

## 2018-12-12 MED ORDER — ZOLPIDEM TARTRATE 10 MG PO TABS: 10.0000 mg | ORAL_TABLET | Freq: Every evening | ORAL | 0 refills | Status: DC | PRN
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
## 2018-12-14 ENCOUNTER — Other Ambulatory Visit (HOSPITAL_COMMUNITY): Payer: Self-pay | Admitting: Psychiatry

## 2018-12-18 ENCOUNTER — Telehealth: Payer: Self-pay | Admitting: Pulmonary Disease

## 2018-12-18 DIAGNOSIS — G4734 Idiopathic sleep related nonobstructive alveolar hypoventilation: Secondary | ICD-10-CM

## 2018-12-18 NOTE — Telephone Encounter (Signed)
ATC Patient.  LMTCB. 

## 2018-12-19 ENCOUNTER — Encounter: Payer: Self-pay | Admitting: Nurse Practitioner

## 2018-12-19 ENCOUNTER — Telehealth: Payer: Self-pay

## 2018-12-19 ENCOUNTER — Ambulatory Visit (INDEPENDENT_AMBULATORY_CARE_PROVIDER_SITE_OTHER): Payer: Medicare Other | Admitting: Nurse Practitioner

## 2018-12-19 ENCOUNTER — Other Ambulatory Visit (INDEPENDENT_AMBULATORY_CARE_PROVIDER_SITE_OTHER): Payer: Medicare Other

## 2018-12-19 VITALS — BP 126/82 | HR 74 | Temp 97.1°F | Ht 62.0 in | Wt 246.1 lb

## 2018-12-19 DIAGNOSIS — R1010 Upper abdominal pain, unspecified: Secondary | ICD-10-CM

## 2018-12-19 DIAGNOSIS — K219 Gastro-esophageal reflux disease without esophagitis: Secondary | ICD-10-CM

## 2018-12-19 DIAGNOSIS — K5909 Other constipation: Secondary | ICD-10-CM | POA: Diagnosis not present

## 2018-12-19 DIAGNOSIS — K649 Unspecified hemorrhoids: Secondary | ICD-10-CM | POA: Diagnosis not present

## 2018-12-19 LAB — CBC
HCT: 39.8 % (ref 36.0–46.0)
Hemoglobin: 13.1 g/dL (ref 12.0–15.0)
MCHC: 33 g/dL (ref 30.0–36.0)
MCV: 95.7 fl (ref 78.0–100.0)
Platelets: 141 10*3/uL — ABNORMAL LOW (ref 150.0–400.0)
RBC: 4.16 Mil/uL (ref 3.87–5.11)
RDW: 14 % (ref 11.5–15.5)
WBC: 10.1 10*3/uL (ref 4.0–10.5)

## 2018-12-19 LAB — COMPREHENSIVE METABOLIC PANEL
ALT: 12 U/L (ref 0–35)
AST: 14 U/L (ref 0–37)
Albumin: 4 g/dL (ref 3.5–5.2)
Alkaline Phosphatase: 113 U/L (ref 39–117)
BUN: 14 mg/dL (ref 6–23)
CO2: 35 mEq/L — ABNORMAL HIGH (ref 19–32)
Calcium: 11 mg/dL — ABNORMAL HIGH (ref 8.4–10.5)
Chloride: 103 mEq/L (ref 96–112)
Creatinine, Ser: 0.89 mg/dL (ref 0.40–1.20)
GFR: 77.41 mL/min (ref >60.00)
Glucose, Bld: 83 mg/dL (ref 70–99)
Potassium: 3.8 mEq/L (ref 3.5–5.1)
Sodium: 143 mEq/L (ref 135–145)
Total Bilirubin: 0.3 mg/dL (ref 0.2–1.2)
Total Protein: 7.5 g/dL (ref 6.0–8.3)

## 2018-12-19 LAB — LIPASE: Lipase: 55 U/L (ref 11.0–59.0)

## 2018-12-19 MED ORDER — HYDROCORTISONE (PERIANAL) 2.5 % EX CREA: 1.0000 "application " | TOPICAL_CREAM | Freq: Two times a day (BID) | CUTANEOUS | 1 refills | Status: DC

## 2018-12-19 MED ORDER — LINACLOTIDE 145 MCG PO CAPS: 145.0000 ug | ORAL_CAPSULE | Freq: Every day | ORAL | 1 refills | Status: DC

## 2018-12-19 MED ORDER — HYDROCORTISONE (PERIANAL) 2.5 % EX CREA: 1.0000 "application " | TOPICAL_CREAM | Freq: Every day | CUTANEOUS | 1 refills | Status: DC

## 2018-12-19 MED ORDER — OMEPRAZOLE 20 MG PO CPDR: 20.0000 mg | DELAYED_RELEASE_CAPSULE | ORAL | 1 refills | Status: DC

## 2018-12-19 NOTE — Telephone Encounter (Signed)
Covid-19 screening questions   Do you now or have you had a fever in the last 14 days? No  Do you have any respiratory symptoms of shortness of breath or cough now or in the last 14 days? Yes but she said it is due to her pain  Do you have any family members or close contacts with diagnosed or suspected Covid-19 in the past 14 days? No  Have you been tested for Covid-19 and found to be positive? No

## 2018-12-19 NOTE — Telephone Encounter (Signed)
Attempted to call pt but line went straight to VM. Left message for pt to return call. 

## 2018-12-19 NOTE — Patient Instructions (Signed)
If you are age 63 or older, your body mass index should be between 23-30. Your Body mass index is 45.02 kg/m. If this is out of the aforementioned range listed, please consider follow up with your Primary Care Provider.  If you are age 61 or younger, your body mass index should be between 19-25. Your Body mass index is 45.02 kg/m. If this is out of the aformentioned range listed, please consider follow up with your Primary Care Provider.   Your provider has requested that you go to the basement level for lab work before leaving today. Press "B" on the elevator. The lab is located at the first door on the left as you exit the elevator.  We have sent the following medications to your pharmacy for you to pick up at your convenience: Anusol cream  Start Metamucil daily.  Follow up with me or Dr. Hilarie Fredrickson in two months.  Please call the office for an appointment as the schedule is not available at this time.  Thank you for choosing me and [facility].   Tye Savoy, NP

## 2018-12-19 NOTE — Progress Notes (Addendum)
ASSESSMENT / PLAN:   25. 63 yo female with a one month history of non-radiating upper abdominal discomfort with she feels is largely due in part to stress / anxiety. No alarm symptoms such as N/V or recent weight loss. Weight actually up 16 pounds since the beginning of the year.She is followed by [facility] ( Dr. Montel Culver) and medications in midst of being adjusted.  -update labs -refill prilosec ( she is out ) -Follow up with me in 6-8 weeks. Further evaluation depending on clinical course.   2. Chronic constipation. Wants Linzess refill, taking family member's which she thinks is 145 mcg.   -will order Linzess 145 mcg though I think she was on higher dose at one time -Add daily Metamucil.   3. External hemorrhoids causing discomfort. She likely has internal hemorrhoids as well with some minor self-limited rectal bleeding on occasion.  -start daily metamucil -refill linzess -anusol cream bid - internally and externally x 10 days  Addendum: Reviewed and agree with assessment and management plan. Jerene Bears, MD      HPI:    Chief Complaint:   Abdominal pain, hemorrhoids, refill on prilosec  Patient is a 63 yo female with diabetes, obesity, HTN and history of right RCC s/p cryoablation 2013. She is known to Dr. Hilarie Fredrickson for hx of chronic constipation, belching / bloating and GERD. She had a normal screening colonoscopy in April 2012.   Patient comes in today  with complaints of non-radiating RUQ discomfort present for ~ month. Discomfort feels like her intestines are " splitting". Initially discomfort involved the RUQ but now more diffuse across the upper abdominal pain. Discomfort unrelated to meals or defecation. No nausea / vomiting. . . She feels like anxiety plays a large part in the discomfort. Anytime she hears sometimes upsetting her stomach starts hurting. Under a lot of stress right now. Her children are homeless, she is taking care of her  grandchildren. She is followed by [facility].   Ms Keats asks for help with her hemorrhoids. She has burning at times. She has has seen a small amount of blood in stool a couple of times.  Feels like constipation aggravating hemorrhoids, has to strain at times. She has chronic constipation. Takes a family member;s Linzess, she has been out of her own Rx.   Data Reviewed:  No recent labs No recent abdominal imaging   Past Medical History:  Diagnosis Date  . Allergic rhinitis, cause unspecified   . Anxiety   . Blood dyscrasia    platelets low in past  . Chronic back pain   . Chronic constipation   . Chronic sinusitis   . Depression   . Endometriosis   . ENDOMETRIOSIS 05/06/2009   Qualifier: Diagnosis of  By: Varney Daily RN, Butch Penny    . Esophageal stricture   . GERD (gastroesophageal reflux disease)   . Hemorrhoids   . Hiatal hernia   . Hyperlipidemia   . Hypertension   . Irritable bowel syndrome   . Lymphedema   . Osteoarthritis   . Personal history of diabetes mellitus    controlled with diet only  . Renal cell cancer ([place]) 11/2010 dx   R, s/p cryoablation 02/16/11  . Syncopal episodes    since childhood     Past Surgical History:  Procedure Laterality Date  . BREAST CYST EXCISION Bilateral over 10 years ago   No visable  scar   . BREAST SURGERY     bilateral, cyst removal  . fallopian tube removed    . FUNCTIONAL ENDOSCOPIC SINUS SURGERY    . HERNIA REPAIR    . LASER ABLATION OF THE CERVIX    . 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 TOTAL KNEE ARTHROPLASTY Right 10/30/2016   Procedure: RIGHT TOTAL KNEE ARTHROPLASTY;  Surgeon: Meredith Pel, MD;  Location: [facility];  Service: Orthopedics;  Laterality: Right;  . TUBAL LIGATION     Family History  Problem Relation Age of Onset  . Diabetes Mother   . Hypertension Mother   . Hyperlipidemia Mother   . Heart disease Mother   . Stroke Mother   . Kidney disease Mother   . Thyroid  disease Mother   . Sleep apnea Mother   . Obesity Mother   . Kidney disease Father   . Prostate cancer Father   . Alcoholism Father   . Alcohol abuse Father   . Colitis Brother   . Alcohol abuse Brother   . Leukemia Brother   . Multiple sclerosis Sister   . Colon polyps Other   . Breast cancer Maternal Aunt   . Breast cancer Cousin   . Alcohol abuse Daughter   . Colon cancer Neg Hx    Social History   Tobacco Use  . Smoking status: Never Smoker  . Smokeless tobacco: Never Used  Substance Use Topics  . Alcohol use: No    Comment: rare  . Drug use: No   Current Outpatient Medications  Medication Sig Dispense Refill  . alendronate (FOSAMAX) 70 MG tablet TAKE 1 TABLET ONCE A WEEK WITH A FULL GLASS OF WATER ON AN EMPTY STOMACH 12 tablet 1  . AMBULATORY NON FORMULARY MEDICATION 1 Units by Other route daily. Diabetic Shoes 1 Units 0  . amLODipine (NORVASC) 5 MG tablet TAKE 1 TABLET DAILY 90 tablet 1  . atorvastatin (LIPITOR) 10 MG tablet TAKE 1 TABLET DAILY 90 tablet 1  . blood glucose meter kit and supplies KIT Dispense based on patient and insurance preference. Use up to four times daily as directed. (FOR R73.03). 1 each 0  . calcium-vitamin D (OSCAL WITH D) 500-200 MG-UNIT tablet Take 1 tablet by mouth daily.     . collagenase (SANTYL) ointment Santyl 250 unit/gram topical ointment    . cromolyn (OPTICROM) 4 % ophthalmic solution cromolyn 4 % eye drops    . Diclofenac Sodium 2 % SOLN Apply 1 pump twice daily. (Patient taking differently: Apply 1 application topically 2 (two) times daily as needed (PAIN). Apply 1 pump twice daily.) 112 g 1  . fluticasone (FLONASE) 50 MCG/ACT nasal spray Place 1 spray into both nostrils daily. 16 g 2  . furosemide (LASIX) 20 MG tablet TAKE 1 TABLET DAILY 90 tablet 1  . lidocaine (LIDODERM) 5 % Place 1 patch onto the skin daily. Remove & Discard patch within 12 hours or as directed by MD 90 patch 1  . Linaclotide (LINZESS) 145 MCG CAPS capsule Take  1 capsule (145 mcg total) by mouth daily. (Patient taking differently: Take 145 mcg by mouth daily as needed (CONSTIPATION). ) 90 capsule 1  . Liraglutide -Weight Management (SAXENDA) 18 MG/3ML SOPN Saxenda 3 mg/0.5 mL (18 mg/3 mL) subcutaneous pen injector    . losartan (COZAAR) 100 MG tablet losartan 100 mg tablet    . methocarbamol (ROBAXIN) 500 MG tablet Take 1 tablet (500 mg  total) by mouth every 6 (six) hours as needed for muscle spasms. 30 tablet 2  . methylphenidate (RITALIN) 5 MG tablet Take 1 tablet (5 mg total) by mouth 2 (two) times daily with breakfast and lunch for 30 days. 60 tablet 0  . NONFORMULARY OR COMPOUNDED ITEM [facility]  Onychomycosis Nail Lacquer -  Fluconazole 2%, Terbinafine 1% DMSO Apply to affected nail once daily Qty. 120 gm 3 refills 1 each 2  . omeprazole (PRILOSEC) 20 MG capsule TAKE 1 CAPSULE BY MOUTH DAILY FOR 30 DAYS TAKE FIRST THING IN THE MORNING AND DO NOT EAT FOR 1 HOUR    . ondansetron (ZOFRAN) 4 MG tablet Take 1-2 tablets (4-8 mg total) by mouth every 8 (eight) hours as needed for nausea or vomiting. 40 tablet 0  . Prenatal Vit-Fe Fumarate-FA (PNV PRENATAL PLUS MULTIVITAMIN) 27-1 MG TABS TAKE 1 TABLET DAILY AT 12 NOON 60 tablet 3  . propranolol (INDERAL) 80 MG tablet Take 1 tablet (80 mg total) by mouth 2 (two) times daily. 180 tablet 1  . telmisartan (MICARDIS) 80 MG tablet TAKE 1 TABLET DAILY 90 tablet 0  . venlafaxine XR (EFFEXOR-XR) 75 MG 24 hr capsule TAKE 3 CAPSULES BY MOUTH ONCE DAILY WITH BREAKFAST 90 capsule 0  . zolpidem (AMBIEN) 10 MG tablet Take 1 tablet (10 mg total) by mouth at bedtime as needed for sleep. 90 tablet 0   No current facility-administered medications for this visit.    Allergies  Allergen Reactions  . Sertraline Hcl Other (See Comments)    Spasms; numbness  . Ace Inhibitors Cough  . Codeine Palpitations  . Darifenacin Hydrobromide Er Other (See Comments)    Pt states made her feel queezy & drunk  . Gabapentin  Other (See Comments)    Hallucinations  . Pravastatin Other (See Comments)    myalgias  . Propoxyphene Hcl Palpitations  . Wellbutrin [Bupropion Hcl] Other (See Comments)    Numbness of mouth/lips     Review of Systems: All systems reviewed and negative except where noted in HPI.   Creatinine clearance cannot be calculated (Patient's most recent lab result is older than the maximum 21 days allowed.)   Physical Exam:    Wt Readings from Last 3 Encounters:  12/19/18 246 lb 2 oz (111.6 kg)  09/02/18 233 lb 12.8 oz (106.1 kg)  06/30/18 230 lb (104.3 kg)    Ht _0  (1.575 m)   Wt 246 lb 2 oz (111.6 kg)   BMI 45.02 kg/m  Constitutional:  Pleasant female in no acute distress. Psychiatric: Normal mood and affect. Behavior is normal. EENT: Pupils normal.  Conjunctivae are normal. No scleral icterus. Neck supple.  Cardiovascular: Normal rate, regular rhythm. No edema Pulmonary/chest: Effort normal and breath sounds normal. No wheezing, rales or rhonchi. Abdominal: Soft, nondistended, nontender. Bowel sounds active throughout. There are no masses palpable. No hepatomegaly. Rectal :  External non-thrombosed hermorrhoids Neurological: Alert and oriented to person place and time. Skin: Skin is warm and dry. No rashes noted.  Tye Savoy, NP  12/19/2018, 2:46 PM   Burns, Claudina Lick, MD

## 2018-12-21 ENCOUNTER — Encounter: Payer: Self-pay | Admitting: Nurse Practitioner

## 2018-12-22 NOTE — Telephone Encounter (Signed)
Called and spoke with pt. Asked pt what monitor she was talking about in regards to her O2 and pt said she had meant the monitor for her blood sugars. I stated to pt that she needed to contact PCP in regards to monitor for blood sugars.  Pt stated in regards to the oxygen, she is wanting to know if another ONO can be repeated to see if she still needed the O2 at night. Dr. Ander Slade, please advise if you are fine with [place] ordering an ONO to be performed again on room air for pt to see is she still needs O2? Thanks!

## 2018-12-22 NOTE — Telephone Encounter (Signed)
Okay to order overnight oximetry on room air

## 2018-12-22 NOTE — Telephone Encounter (Signed)
ONO order has been placed. Nothing further needed.

## 2018-12-23 ENCOUNTER — Other Ambulatory Visit (HOSPITAL_COMMUNITY): Payer: Self-pay

## 2018-12-23 ENCOUNTER — Telehealth: Payer: Self-pay | Admitting: Internal Medicine

## 2018-12-23 DIAGNOSIS — R600 Localized edema: Secondary | ICD-10-CM

## 2018-12-23 DIAGNOSIS — G629 Polyneuropathy, unspecified: Secondary | ICD-10-CM

## 2018-12-23 DIAGNOSIS — G8929 Other chronic pain: Secondary | ICD-10-CM

## 2018-12-23 DIAGNOSIS — R7303 Prediabetes: Secondary | ICD-10-CM

## 2018-12-23 MED ORDER — VENLAFAXINE HCL ER 75 MG PO CP24: ORAL_CAPSULE | ORAL | 0 refills | Status: DC

## 2018-12-23 NOTE — Telephone Encounter (Signed)
Pt lost her prescription for her diabetic shoes and want you to fax one to [facility]. Pt will call back tomorrow with fax number. Pt also need bath chair for bathroom

## 2018-12-24 ENCOUNTER — Telehealth: Payer: Self-pay | Admitting: Pulmonary Disease

## 2018-12-24 NOTE — Telephone Encounter (Signed)
Community message sent to [facility] will await there answer.

## 2018-12-24 NOTE — Telephone Encounter (Signed)
Pt is scheduled for a visit with Dr. Ander Slade 7/6.    On 6/29, pt was spoken to in regards to wanting to have an ONO ordered and the order was placed for the ONO.  Per pt's chart, the order for the ONO was sent to [facility] on 6/29.  Called and spoke with pt letting her know that the order was placed 6/29 for the ONO. Asked pt if she had heard anything from [facility] in regards to this and pt stated she has not heard anything.  PCCs, is there any way you can help [place] out with this as pt was hoping to be able to have ONO performed prior to OV with AO 7/6.

## 2018-12-24 NOTE — Telephone Encounter (Signed)
Patient would like a call back from the nurse regarding a [facility] fax # and diabetic shoes and a bath chair.  Please advise and call patient back at [phone]

## 2018-12-25 ENCOUNTER — Telehealth: Payer: Self-pay | Admitting: Pulmonary Disease

## 2018-12-25 NOTE — Telephone Encounter (Signed)
Rx sent 

## 2018-12-25 NOTE — Telephone Encounter (Signed)
I put a message back yesterday from the previous phone note still waiting for a response

## 2018-12-25 NOTE — Telephone Encounter (Signed)
Called and spoke with pt in regards to [facility] stating they need an order for pt to have ONO. Stated to pt that an order was placed 6/29 and it was cosigned off by AO.  PCCS, is there any way you can help [place] out with this to see exactly what is needed due to there being an order that was placed?

## 2018-12-25 NOTE — Telephone Encounter (Signed)
Ok I called [facility] they have everything they need they are going to call the patient today spoke to [place]

## 2018-12-26 NOTE — Telephone Encounter (Signed)
Pt called in and stated that the wrong bath chair was order.  It should be "Tranfer Bench"  She would like to know if a new order could be call in for this   [facility] number [phone]

## 2018-12-29 ENCOUNTER — Ambulatory Visit: Payer: Medicare Other | Admitting: Pulmonary Disease

## 2018-12-29 ENCOUNTER — Encounter: Payer: Self-pay | Admitting: Pulmonary Disease

## 2018-12-29 NOTE — Telephone Encounter (Signed)
Rx changed and refaxed.

## 2018-12-29 NOTE — Addendum Note (Signed)
Addended by: Delice Bison E on: 12/29/2018 09:19 AM   Modules accepted: Orders

## 2019-01-02 ENCOUNTER — Telehealth: Payer: Self-pay | Admitting: Pulmonary Disease

## 2019-01-02 NOTE — Telephone Encounter (Signed)
ATC patient unable to reach [facility] x1

## 2019-01-02 NOTE — Telephone Encounter (Signed)
Pt returning call and can be reached @ [phone].Janet Le

## 2019-01-02 NOTE — Telephone Encounter (Signed)
Spoke with the pt  She is calling just to confirm her appt here on 01/05/19  Appt confirmed and screening done and is neg  Nothing further needed

## 2019-01-05 ENCOUNTER — Telehealth: Payer: Self-pay | Admitting: Pulmonary Disease

## 2019-01-05 ENCOUNTER — Other Ambulatory Visit (HOSPITAL_COMMUNITY): Payer: Self-pay | Admitting: Psychiatry

## 2019-01-05 ENCOUNTER — Telehealth (HOSPITAL_COMMUNITY): Payer: Self-pay

## 2019-01-05 ENCOUNTER — Ambulatory Visit: Payer: Medicare Other | Admitting: Pulmonary Disease

## 2019-01-05 DIAGNOSIS — G4734 Idiopathic sleep related nonobstructive alveolar hypoventilation: Secondary | ICD-10-CM

## 2019-01-05 MED ORDER — METHYLPHENIDATE HCL 10 MG PO TABS: 10.0000 mg | ORAL_TABLET | Freq: Two times a day (BID) | ORAL | 0 refills | Status: DC

## 2019-01-05 NOTE — Telephone Encounter (Signed)
Patient called regarding her Methylphenidate 5mg . Patient stated she feels like the dosage is not working for her. She also stated she wanted doctor to know she yelled out in her sleep last night. Please review and advise. Thank you.

## 2019-01-05 NOTE — Telephone Encounter (Signed)
Called and spoke with pt. Pt stated that she still has not heard from [facility] in regards to having ONO done. Stated to pt on 7/2, we spoke with Ailene Ravel from [facility] and she said that they should be contacting pt that day. Pt said that she has not heard from [facility] still.  Pt was supposed to have had her OV with AO today, 7/13 but it had to be rescheduled for 7/16. Pt was hoping to be able to have ONO performed prior to OV.  PCCS, please advise. Thanks!

## 2019-01-05 NOTE — Telephone Encounter (Signed)
Then we should try a higher 10 mg dose. I doubt if Ritalin has anything to do with her yelling at night.

## 2019-01-06 ENCOUNTER — Telehealth: Payer: Self-pay | Admitting: Nurse Practitioner

## 2019-01-06 NOTE — Telephone Encounter (Signed)
Per Melissa with [facility], patient completed the ONO on 12/29/2018 & she is faxing me the ONO report.

## 2019-01-06 NOTE — Telephone Encounter (Signed)
Noted. Routing this encounter to Lattie Haw T who has been working with AO so she can keep an eye out for the ONO report.

## 2019-01-06 NOTE — Telephone Encounter (Signed)
I have reached out the [employer] for an update.  I will let you know once I've heard something.

## 2019-01-06 NOTE — Telephone Encounter (Signed)
Per Dr Ander Slade- ONO results showed no desaturations, no need for oxygen. Ok to discontinue oxygen. Called and spoke with Patient. AO recommendations given.  Understanding stated.  Patient rescheduled for OV 01/19/19 at 1100.   O2 order discontinued.  Nothing further at this time.

## 2019-01-06 NOTE — Telephone Encounter (Signed)
Patient called wanting to discus lab results that were order by paula.

## 2019-01-07 NOTE — Telephone Encounter (Signed)
Patient calling back regarding this. Requesting to speak to a nurse regarding lab results. [facility] # [phone]

## 2019-01-07 NOTE — Telephone Encounter (Signed)
Spoke with patient to let her know. Thank you.

## 2019-01-07 NOTE — Telephone Encounter (Signed)
States no one told her about her labs that she can remember. Still having abdominal pain. Reviewed her labs with her. She does not have an appointment scheduled and there are not any openings at this point.

## 2019-01-08 ENCOUNTER — Telehealth: Payer: Self-pay | Admitting: Internal Medicine

## 2019-01-08 ENCOUNTER — Ambulatory Visit: Payer: Medicare Other | Admitting: Pulmonary Disease

## 2019-01-08 ENCOUNTER — Other Ambulatory Visit: Payer: Self-pay

## 2019-01-08 ENCOUNTER — Ambulatory Visit (INDEPENDENT_AMBULATORY_CARE_PROVIDER_SITE_OTHER): Payer: Medicare Other | Admitting: Psychiatry

## 2019-01-08 DIAGNOSIS — F4312 Post-traumatic stress disorder, chronic: Secondary | ICD-10-CM | POA: Diagnosis not present

## 2019-01-08 DIAGNOSIS — F909 Attention-deficit hyperactivity disorder, unspecified type: Secondary | ICD-10-CM | POA: Diagnosis not present

## 2019-01-08 DIAGNOSIS — F341 Dysthymic disorder: Secondary | ICD-10-CM
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
## 2019-01-08 NOTE — Telephone Encounter (Signed)
Patient called in needing an urgent appointment with dr.pyrtle. she is having sharp pains in her stomach. She stated that it feels like her intestines are splitting. She is having constipation as well.

## 2019-01-08 NOTE — Telephone Encounter (Signed)
Splitting pain in the abd has resolved, she is taking metamucil and linzess 145 mcg daily.  Her constipation has gotten better but still has some abd discomfort.  She was advised to schedule a follow up for 2 months after seeing Janet Le in June.  Appt made with Janet Le for 8/17.  The pt has been advised to call if the "splitting pain" returns.  Pt verbalized understanding.

## 2019-01-12 ENCOUNTER — Ambulatory Visit (HOSPITAL_COMMUNITY): Payer: Medicare Other | Admitting: Psychiatry

## 2019-01-19 ENCOUNTER — Encounter: Payer: Self-pay | Admitting: Pulmonary Disease

## 2019-01-19 ENCOUNTER — Ambulatory Visit: Payer: Medicare Other | Admitting: Pulmonary Disease

## 2019-01-19 ENCOUNTER — Other Ambulatory Visit: Payer: Self-pay

## 2019-01-19 ENCOUNTER — Encounter: Payer: Self-pay | Admitting: *Deleted

## 2019-01-19 VITALS — BP 128/66 | HR 72 | Temp 98.1°F | Ht 62.0 in | Wt 250.8 lb

## 2019-01-19 DIAGNOSIS — R0609 Other forms of dyspnea: Secondary | ICD-10-CM

## 2019-01-19 DIAGNOSIS — R0600 Dyspnea, unspecified: Secondary | ICD-10-CM

## 2019-01-19 NOTE — Progress Notes (Signed)
Janet Le    [id number]    Aug 05, 1955  Primary Care Physician:Burns, Claudina Lick, MD  Referring Physician: Binnie Rail, MD [place],  [place] [address]  Chief complaint:    History of obstructive sleep apnea-recent study was negative History of anxiety Abdominal discomfort-likely related to stress  HPI: Has some abdominal discomfort, knots in the abdomen Denies any significant symptoms today Feels relatively well Recent nocturnal oximetry did not show significant desaturations  Diagnoses obstructive sleep apnea in the past, did use CPAP in the past Recent polysomnogram in 2019 was negative for significant sleep disordered breathing She did have repeat studies that were negative  usually tries to go to bed by 11 PM, wakes up a few times during the night She tries to get up in the morning about 10 AM She will start feeling sleepy probably about 2 PM  Sleep is nonrestorative  Appears to have significant anxiety that may be contributing to symptoms On venlafaxine  Smoking history: Non-smoker  Outpatient Encounter Medications as of 01/19/2019  Medication Sig  . alendronate (FOSAMAX) 70 MG tablet TAKE 1 TABLET ONCE A WEEK WITH A FULL GLASS OF WATER ON AN EMPTY STOMACH  . AMBULATORY NON FORMULARY MEDICATION 1 Units by Other route daily. Diabetic Shoes  . amLODipine (NORVASC) 5 MG tablet TAKE 1 TABLET DAILY  . atorvastatin (LIPITOR) 10 MG tablet TAKE 1 TABLET DAILY  . blood glucose meter kit and supplies KIT Dispense based on patient and insurance preference. Use up to four times daily as directed. (FOR R73.03).  . calcium-vitamin D (OSCAL WITH D) 500-200 MG-UNIT tablet Take 1 tablet by mouth daily.   . collagenase (SANTYL) ointment as needed.   . cromolyn (OPTICROM) 4 % ophthalmic solution as needed.   . Diclofenac Sodium 2 % SOLN Apply 1 pump twice daily. (Patient taking differently: Apply 1 application topically 2 (two) times daily as needed (PAIN).  Apply 1 pump twice daily.)  . fluticasone (FLONASE) 50 MCG/ACT nasal spray Place 1 spray into both nostrils daily. (Patient taking differently: Place 1 spray into both nostrils as needed. )  . furosemide (LASIX) 20 MG tablet TAKE 1 TABLET DAILY  . hydrocortisone (ANUSOL-HC) 2.5 % rectal cream Place 1 application rectally 2 (two) times daily.  . hydrocortisone (ANUSOL-HC) 2.5 % rectal cream Place 1 application rectally at bedtime. Use for 10 days  . lidocaine (LIDODERM) 5 % Place 1 patch onto the skin daily. Remove & Discard patch within 12 hours or as directed by MD  . linaclotide (LINZESS) 145 MCG CAPS capsule Take 1 capsule (145 mcg total) by mouth daily before breakfast.  . losartan (COZAAR) 100 MG tablet losartan 100 mg tablet  . methocarbamol (ROBAXIN) 500 MG tablet Take 1 tablet (500 mg total) by mouth every 6 (six) hours as needed for muscle spasms.  . methylphenidate (RITALIN) 10 MG tablet Take 1 tablet (10 mg total) by mouth 2 (two) times daily with breakfast and lunch.  . Multiple Vitamin (MULTIVITAMIN) tablet Take 1 tablet by mouth daily.  . NONFORMULARY OR COMPOUNDED ITEM [facility]  Onychomycosis Nail Lacquer -  Fluconazole 2%, Terbinafine 1% DMSO Apply to affected nail once daily Qty. 120 gm 3 refills  . omeprazole (PRILOSEC) 20 MG capsule Take 1 capsule (20 mg total) by mouth every morning. Take 30 minutes before breakfast  . ondansetron (ZOFRAN) 4 MG tablet Take 4 mg by mouth 2 (two) times daily. As needed  .  Prenatal Vit-Fe Fumarate-FA (PNV PRENATAL PLUS MULTIVITAMIN) 27-1 MG TABS TAKE 1 TABLET DAILY AT 12 NOON  . propranolol (INDERAL) 80 MG tablet Take 1 tablet (80 mg total) by mouth 2 (two) times daily.  [name] telmisartan (MICARDIS) 80 MG tablet TAKE 1 TABLET DAILY  . venlafaxine XR (EFFEXOR-XR) 75 MG 24 hr capsule TAKE 3 CAPSULES BY MOUTH ONCE DAILY WITH BREAKFAST  . zolpidem (AMBIEN) 10 MG tablet Take 1 tablet (10 mg total) by mouth at bedtime as needed for sleep.    No facility-administered encounter medications on file as of 01/19/2019.     Allergies as of 01/19/2019 - Review Complete 01/19/2019  Allergen Reaction Noted  . Sertraline hcl Other (See Comments) 04/11/2011  . Ace inhibitors Cough 07/30/2012  . Codeine Palpitations   . Darifenacin hydrobromide er Other (See Comments) 05/14/2011  . Gabapentin Other (See Comments) 10/23/2011  . Pravastatin Other (See Comments) 01/06/2014  . Propoxyphene hcl Palpitations   . Wellbutrin [bupropion hcl] Other (See Comments) 01/10/2011    Past Medical History:  Diagnosis Date  . Allergic rhinitis, cause unspecified   . Anxiety   . Blood dyscrasia    platelets low in past  . Chronic back pain   . Chronic constipation   . Chronic sinusitis   . Depression   . Endometriosis   . ENDOMETRIOSIS 05/06/2009   Qualifier: Diagnosis of  By: Varney Daily RN, Butch Penny    . Esophageal stricture   . GERD (gastroesophageal reflux disease)   . Hemorrhoids   . Hiatal hernia   . Hyperlipidemia   . Hypertension   . Irritable bowel syndrome   . Lymphedema   . Osteoarthritis   . Personal history of diabetes mellitus    controlled with diet only  . Renal cell cancer ([place]) 11/2010 dx   R, s/p cryoablation 02/16/11  . Syncopal episodes    since childhood    Past Surgical History:  Procedure Laterality Date  . BREAST CYST EXCISION Bilateral over 10 years ago   No visable scar   . BREAST SURGERY     bilateral, cyst removal  . fallopian tube removed    . FUNCTIONAL ENDOSCOPIC SINUS SURGERY    . HERNIA REPAIR    . LASER ABLATION OF THE CERVIX    . 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 TOTAL KNEE ARTHROPLASTY Right 10/30/2016   Procedure: RIGHT TOTAL KNEE ARTHROPLASTY;  Surgeon: Meredith Pel, MD;  Location: [facility];  Service: Orthopedics;  Laterality: Right;  . TUBAL LIGATION      Family History  Problem Relation Age of Onset  . Diabetes Mother   . Hypertension Mother   .  Hyperlipidemia Mother   . Heart disease Mother   . Stroke Mother   . Kidney disease Mother   . Thyroid disease Mother   . Sleep apnea Mother   . Obesity Mother   . Kidney disease Father   . Prostate cancer Father   . Alcoholism Father   . Alcohol abuse Father   . Colitis Brother   . Alcohol abuse Brother   . Leukemia Brother   . Multiple sclerosis Sister   . Colon polyps Other   . Breast cancer Maternal Aunt   . Breast cancer Cousin   . Alcohol abuse Daughter   . Colon cancer Neg Hx     Social History   Socioeconomic History  . Marital status: Legally Separated    Spouse name: Not  on file  . Number of children: Not on file  . Years of education: Not on file  . Highest education level: Not on file  Occupational History  . Occupation: Disabled  Social Needs  . Financial resource strain: Not on file  . Food insecurity    Worry: Not on file    Inability: Not on file  . Transportation needs    Medical: Not on file    Non-medical: Not on file  Tobacco Use  . Smoking status: Never Smoker  . Smokeless tobacco: Never Used  Substance and Sexual Activity  . Alcohol use: No    Comment: rare  . Drug use: No  . Sexual activity: Never  Lifestyle  . Physical activity    Days per week: Not on file    Minutes per session: Not on file  . Stress: Not on file  Relationships  . Social Herbalist on phone: Not on file    Gets together: Not on file    Attends religious service: Not on file    Active member of club or organization: Not on file    Attends meetings of clubs or organizations: Not on file    Relationship status: Not on file  . Intimate partner violence    Fear of current or ex partner: Not on file    Emotionally abused: Not on file    Physically abused: Not on file    Forced sexual activity: Not on file  Other Topics Concern  . Not on file  Social History Narrative  . Not on file    Review of Systems  Constitutional: Positive for fatigue.  HENT:  Negative.   Eyes: Negative.   Respiratory: Positive for apnea.   Cardiovascular: Negative for chest pain and leg swelling.  Gastrointestinal: Negative.   Endocrine: Negative.   Genitourinary: Negative.   Musculoskeletal: Positive for arthralgias.  Hematological: Negative.   Psychiatric/Behavioral: Positive for sleep disturbance. Negative for agitation.    Vitals:   01/19/19 1055  BP: 128/66  Pulse: 72  Temp: 98.1 F (36.7 C)  SpO2: 97%     Physical Exam  Constitutional: She is oriented to person, place, and time. She appears well-developed and well-nourished.  Obese  HENT:  Head: Normocephalic and atraumatic.  Eyes: Pupils are equal, round, and reactive to light. Conjunctivae and EOM are normal. Right eye exhibits no discharge. Left eye exhibits no discharge.  Neck: Normal range of motion. Neck supple. No tracheal deviation present. No thyromegaly present.  Cardiovascular: Normal rate and regular rhythm.  Pulmonary/Chest: Effort normal and breath sounds normal. No respiratory distress. She has no wheezes. She has no rales.  Abdominal: Soft. Bowel sounds are normal. She exhibits no distension.  Musculoskeletal: Normal range of motion.        General: No edema.  Neurological: She is alert and oriented to person, place, and time.  Skin: Skin is warm and dry. No erythema.  Psychiatric: She has a normal mood and affect.   Data Reviewed: Recent nocturnal oximetry reviewed showing no significant desaturations Echocardiogram from 2018 does not reveal any pulmonary hypertension Recent sleep study from 2019 reviewed    Assessment:   Shortness of breath with activity Nonrestorative sleep Excessive daytime sleepiness -Negative sleep study -Negative study for significant nocturnal desaturations  .  Anxiety may be contributing significantly to current symptoms .  She is currently on venlafaxine, may need additional agents  Plan/Recommendations:  Does not require oxygen  supplementation  Importance  of weight loss and physical activity discussed.  Graded physical activity as tolerated was discussed extensively  I will see her back in the office in about 6 months  Encouraged to call with any significant concerns  Secondary to continuing complaints about shortness of breath, we will obtain a pulmonary function study  Encouraged to call with any significant concerns  Sherrilyn Rist MD [facility] 01/19/2019, 11:41 AM  CC: Binnie Rail, MD

## 2019-01-19 NOTE — Patient Instructions (Signed)
Dyspnea on exertion Anxiety likely playing a significant role  Optimize antianxiety medications Obtain a pulmonary function test  I will see you back in the office in 6 to 8 weeks Regular exercise is encouraged

## 2019-02-05 ENCOUNTER — Telehealth (HOSPITAL_COMMUNITY): Payer: Self-pay

## 2019-02-05 NOTE — Telephone Encounter (Signed)
Patient called regarding her medication. She stated that she wants to increase her Methylphenidate 10mg  to 2 tablets BID and wants to increase her Venlafaxine XR 75mg  to 4 capsules instead of 3. She also stated that she would like to talk to the doctor. Please review and advise. Thank you.

## 2019-02-09 ENCOUNTER — Encounter: Payer: Self-pay | Admitting: Internal Medicine

## 2019-02-09 ENCOUNTER — Other Ambulatory Visit: Payer: Self-pay

## 2019-02-09 ENCOUNTER — Ambulatory Visit (INDEPENDENT_AMBULATORY_CARE_PROVIDER_SITE_OTHER): Payer: Medicare Other | Admitting: Psychiatry

## 2019-02-09 ENCOUNTER — Ambulatory Visit (INDEPENDENT_AMBULATORY_CARE_PROVIDER_SITE_OTHER): Payer: Medicare Other | Admitting: Internal Medicine

## 2019-02-09 VITALS — BP 116/80 | HR 72 | Temp 98.2°F | Ht 62.0 in | Wt 248.4 lb

## 2019-02-09 DIAGNOSIS — K581 Irritable bowel syndrome with constipation: Secondary | ICD-10-CM | POA: Diagnosis not present

## 2019-02-09 DIAGNOSIS — F4312 Post-traumatic stress disorder, chronic: Secondary | ICD-10-CM

## 2019-02-09 DIAGNOSIS — F341 Dysthymic disorder: Secondary | ICD-10-CM | POA: Diagnosis not present

## 2019-02-09 DIAGNOSIS — K648 Other hemorrhoids: Secondary | ICD-10-CM | POA: Diagnosis not present

## 2019-02-09 DIAGNOSIS — F909 Attention-deficit hyperactivity disorder, unspecified type: Secondary | ICD-10-CM

## 2019-02-09 MED ORDER — METHYLPHENIDATE HCL 20 MG PO TABS: 20.0000 mg | ORAL_TABLET | Freq: Two times a day (BID) | ORAL | 0 refills | Status: DC

## 2019-02-09 MED ORDER — DICYCLOMINE HCL 10 MG PO CAPS: 10.0000 mg | ORAL_CAPSULE | Freq: Three times a day (TID) | ORAL | 2 refills | Status: DC | PRN

## 2019-02-09 NOTE — Patient Instructions (Addendum)
Continue Linzess 145 mcg once daily.  We have sent the following medications to your pharmacy for you to pick up at your convenience: Bentyl 10-20 mg three times daily as needed for abdominal cramping and pain  You have been scheduled for your first hemorrhoidal banding on Tuesday, 03/03/19 at 4 pm.  You are scheduled for your 2nd hemorrhoidal banding on 03/17/19 at 4:00 pm.  If you are age 63 or older, your body mass index should be between 23-30. Your Body mass index is 45.43 kg/m. If this is out of the aforementioned range listed, please consider follow up with your Primary Care Provider.  If you are age 63 or younger, your body mass index should be between 19-25. Your Body mass index is 45.43 kg/m. If this is out of the aformentioned range listed, please consider follow up with your Primary Care Provider.
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
## 2019-02-10 ENCOUNTER — Other Ambulatory Visit (HOSPITAL_COMMUNITY): Payer: Self-pay | Admitting: Psychiatry

## 2019-02-10 ENCOUNTER — Telehealth (HOSPITAL_COMMUNITY): Payer: Self-pay

## 2019-02-10 MED ORDER — ZOLPIDEM TARTRATE 10 MG PO TABS: 10.0000 mg | ORAL_TABLET | Freq: Every evening | ORAL | 0 refills | Status: DC | PRN

## 2019-02-10 NOTE — Telephone Encounter (Signed)
She should keep only 03/17/19 appointment and absolutely should not take 20 mg of Ambien! She has refill on Ambien although I suspect they will make her pay full cost if she run out early.

## 2019-02-10 NOTE — Telephone Encounter (Signed)
Patient called regarding her Zolpidem 10mg . She stated that she has been taking 2 tablets as opposed to 1 and that she has run out of medication. Also, patient has an appointment scheduled for 02/24/19 and 03/17/19. She would like to know if you want her to keep both appointments? Please review and advise. Thank you.

## 2019-02-10 NOTE — Progress Notes (Signed)
Subjective:    Patient ID: Janet Le, female    DOB: 07-Apr-1956, 63 y.o.   MRN: [id number]  HPI Janet Le is a 63 year old female with a history of chronic constipation, GERD, RCC status post cryoablation, hypertension, diabetes, obesity who is seen in follow-up.  She is here alone today and was last seen on 12/19/2018 by Tye Savoy, NP.  At her last visit she was having upper abdominal pain and Nevin Bloodgood recommended that she resume her omeprazole.  She also refilled her Linzess which she was taking for chronic constipation.  The patient reports that she never filled the Prilosec because she was not sure that that is what she needed.  She reports that she was having upper abdominal pain across the epigastrium which was sharp and crampy in nature.  There was a twisting type pain and it was directly associated with stress and worry related to issues with her family.  Her grandson whom she raised from a young age has been causing her some issues.  She also lost her mother last year.  This pain has improved significantly to this point and is no longer bothering her.  It can return when she feels stressed.  She has had good result with resuming MiraLAX at 145 mcg daily.  She is having 1 or 2 bowel movements every day usually about 30 minutes after the dose.  Stools can be soft to loose but she denies diarrhea.  She does have issues with hemorrhoids which have bothered her for years since she was pregnant.  Issues with prolapse, occasional itching and some scant blood with wiping.  She denies having frequent issues with heartburn and no issues with dysphagia or odynophagia  She had a normal colonoscopy in April 2012.  She denies a family history of colon cancer or advanced colon polyps to her knowledge.  Review of Systems As per HPI, otherwise negative  Current Medications, Allergies, Past Medical History, Past Surgical History, Family History and Social History were reviewed in [employer] record.     Objective:   Physical Exam BP 116/80 (BP Location: Right Wrist, Patient Position: Sitting, Cuff Size: Normal)   Pulse 72   Temp 98.2 F (36.8 C)   Ht 5\' 2"  (1.575 m) Comment: height measured without shoes  Wt 248 lb 6 oz (112.7 kg)   BMI 45.43 kg/m  Constitutional: Well-developed and well-nourished. No distress. HEENT: Normocephalic and atraumatic.  Conjunctivae are normal.  No scleral icterus. Neck: Neck supple. Trachea midline. Cardiovascular: Normal rate, regular rhythm and intact distal pulses. No M/R/G Pulmonary/chest: Effort normal and breath sounds normal. No wheezing, rales or rhonchi. Abdominal: Soft,obese, nontender, nondistended. Bowel sounds active throughout.  Rectal: normal external examination, no masses, minor skin tags near anal canal, nontender, normal tone. ANOSCOPY: Using a disposable, lubricated, slotted, self-illuminating anoscope, the rectum was intubated without difficulty. The trochar was removed and the ano-rectum was circumferentially inspected. There were small internal hemorrhoids, RA=LL=RP. There was no finding of an anorectal fissure. The rectal mucosa was not inflamed. No neoplasia or other pathology was identified. The inspection was well tolerated.  Extremities: no clubbing, cyanosis, trace pretibial edema Neurological: Alert and oriented to person place and time. Skin: Skin is warm and dry. Psychiatric: Normal mood and affect. Behavior is normal.  CBC    Component Value Date/Time   WBC 10.1 12/19/2018 1554   RBC 4.16 12/19/2018 1554   HGB 13.1 12/19/2018 1554   HGB 11.9 02/28/2017 1035  HCT 39.8 12/19/2018 1554   HCT 37.0 02/28/2017 1035   PLT 141.0 (L) 12/19/2018 1554   PLT 114 (L) 02/28/2017 1035   MCV 95.7 12/19/2018 1554   MCV 94.9 02/28/2017 1035   MCH 30.5 02/28/2017 1035   MCH 31.1 10/18/2016 1203   MCHC 33.0 12/19/2018 1554   RDW 14.0 12/19/2018 1554   RDW 15.5 (H) 02/28/2017 1035   LYMPHSABS  2.2 11/15/2017 1503   LYMPHSABS 1.8 02/28/2017 1035   MONOABS 0.4 11/15/2017 1503   MONOABS 0.5 02/28/2017 1035   EOSABS 0.1 11/15/2017 1503   EOSABS 0.1 02/28/2017 1035   BASOSABS 0.0 11/15/2017 1503   BASOSABS 0.0 02/28/2017 1035   CMP     Component Value Date/Time   NA 143 12/19/2018 1554   NA 142 03/05/2018 1040   NA 141 02/28/2017 1035   K 3.8 12/19/2018 1554   K 4.0 02/28/2017 1035   CL 103 12/19/2018 1554   CO2 35 (H) 12/19/2018 1554   CO2 28 02/28/2017 1035   GLUCOSE 83 12/19/2018 1554   GLUCOSE 80 02/28/2017 1035   BUN 14 12/19/2018 1554   BUN 11 03/05/2018 1040   BUN 9.0 02/28/2017 1035   CREATININE 0.89 12/19/2018 1554   CREATININE 0.9 02/28/2017 1035   CALCIUM 11.0 (H) 12/19/2018 1554   CALCIUM 10.1 02/28/2017 1035   PROT 7.5 12/19/2018 1554   PROT 6.8 03/05/2018 1040   PROT 7.3 02/28/2017 1035   ALBUMIN 4.0 12/19/2018 1554   ALBUMIN 4.1 03/05/2018 1040   ALBUMIN 3.4 (L) 02/28/2017 1035   AST 14 12/19/2018 1554   AST 18 02/28/2017 1035   ALT 12 12/19/2018 1554   ALT 17 02/28/2017 1035   ALKPHOS 113 12/19/2018 1554   ALKPHOS 92 02/28/2017 1035   BILITOT 0.3 12/19/2018 1554   BILITOT 0.3 03/05/2018 1040   BILITOT 0.27 02/28/2017 1035   GFRNONAA 78 03/05/2018 1040   GFRNONAA 71 05/25/2015 1508   GFRAA 90 03/05/2018 1040   GFRAA 82 05/25/2015 1508      Assessment & Plan:  63 year old female with a history of chronic constipation, GERD, RCC status post cryoablation, hypertension, diabetes, obesity who is seen in follow-up.  1.  Intermittent cramping abdominal pain exacerbated by stress/IBS --her symptoms have improved with improving psychosocial stressors.  This, combined with the nature of the pain which was crampy, likely represents irritable bowel.  Constipation could have been exacerbating this symptom as well which is also improved, see #2.  Given the improvement in the symptom I do not feel further work-up is warranted at this time.  Patient is in  agreement.  I will prescribe Bentyl to be used on an as-needed basis for crampy abdominal pain.  If this recurs or becomes more severe we can evaluate further. --Bentyl 10 mg, 1 to 2 tablets every 6-8 hours as needed for crampy abdominal pain.  2. Chronic constipation --responding well to Linzess at current dose.  Continue current dose. --Continue Linzess 145 mcg daily  3.  Internal hemorrhoids --we spent considerable time today discussing hemorrhoidal banding including the risks and benefits and alternatives.  She is interested.  She will read more on the website for [facility] banding and we will schedule banding appointments.  25 minutes spent with the patient today. Greater than 50% was spent in counseling and coordination of care with the patient

## 2019-02-12 NOTE — Telephone Encounter (Signed)
Spoke with patient and relayed message from the doctor.

## 2019-02-16 ENCOUNTER — Other Ambulatory Visit (HOSPITAL_COMMUNITY): Payer: Self-pay | Admitting: Psychiatry

## 2019-02-23 ENCOUNTER — Telehealth: Payer: Self-pay | Admitting: Pulmonary Disease

## 2019-02-23 NOTE — Telephone Encounter (Signed)
atc pt, no answer, mailbox is full.  Wcb.

## 2019-02-23 NOTE — Telephone Encounter (Signed)
atc pt again, no answer, vm full.  Wcb.

## 2019-02-23 NOTE — Telephone Encounter (Signed)
Inform patient about oximetry  No significant desaturation on nocturnal oximetry  Does not require oxygen supplementation at night

## 2019-02-23 NOTE — Telephone Encounter (Signed)
Patient is returning phone call.  Patient phone number is [phone].

## 2019-02-23 NOTE — Telephone Encounter (Signed)
Call made to patient, confirmed DOB, made aware of results per OA re: ONO.   Voiced understanding. Inquired about appt.Made aware he wants her back in 6-8 weeks with PFT which requires covid testing. She wanted to go ahead and make appt but I made her aware his schedule does not go out that far and we would contact her close to 6-8 weeks to have her back in. Voiced understanding.   Will route message to PFT coordinator to make aware she needs PFT scheduling and covid testing.   Nothing further needed from triage standpoint.

## 2019-02-24 ENCOUNTER — Ambulatory Visit (HOSPITAL_COMMUNITY): Payer: Medicare Other | Admitting: Psychiatry

## 2019-02-26 ENCOUNTER — Other Ambulatory Visit: Payer: Self-pay | Admitting: Internal Medicine

## 2019-02-26 NOTE — Telephone Encounter (Signed)
Scheduled pft on 03/26/2019-pr

## 2019-03-03 ENCOUNTER — Other Ambulatory Visit: Payer: Self-pay

## 2019-03-03 ENCOUNTER — Encounter: Payer: Medicare Other | Admitting: Internal Medicine

## 2019-03-03 MED ORDER — TELMISARTAN 80 MG PO TABS: 80.0000 mg | ORAL_TABLET | Freq: Every day | ORAL | 0 refills | Status: DC

## 2019-03-09 NOTE — Progress Notes (Signed)
Subjective:    Patient ID: Janet Le, female    DOB: December 12, 1955, 63 y.o.   MRN: [id number]  HPI The patient is here for follow up.  She is not exercising regularly.    Hypertension, chronic lower extremity edema: She is taking her medication daily. She is compliant with a low sodium diet.  Her lower extremity edema is fairly stable.  She does try to elevate her legs whenever she can, but this is difficult for her to do.  She does have occasional palpitations, but these are related to increased stress.  She denies chest pain, shortness of breath and regular headaches. She does not monitor her blood pressure at home.    Hyperlipidemia: She is taking her medication daily. She is compliant with a low fat/cholesterol diet. She denies myalgias.   Prediabetes:  She is compliant with a low sugar/carbohydrate diet.  She is not exercising regularly.  She has been monitoring her sugars and she states they have been good.  She is unsure what the numbers are.  She is requesting a new glucometer because her current strips are no longer covered by her insurance.  Anxiety, depression:  She is following with Dr Montel Culver.    Chronic back pain, osteoarthritis in knee, hips and shoulders: She is chronic pain in her back in several joints.  She also has chronic ankle swelling.  She has difficulty getting in and out of a chair and getting up from a chair because of these reasons.  She would like to see if a recliner with a lift is covered by her insurance because this would be very helpful for her.    Medications and allergies reviewed with patient and updated if appropriate.  Patient Active Problem List   Diagnosis Date Noted  . Adult ADHD 01/08/2019  . Dysthymic disorder 08/12/2018  . Chronic post-traumatic stress disorder (PTSD) 08/12/2018  . Bilateral leg edema 06/12/2018  . Fatigue 11/14/2017  . Chronic back pain 11/14/2017  . Arthralgia of hip 11/14/2017  . Memory difficulties 10/08/2017   . Anxiety and depression 10/08/2017  . Neuropathy 10/07/2017  . LVH (left ventricular hypertrophy), mild 05/18/2017  . Arthritis of knee 10/30/2016  . S/P knee surgery 10/30/2016  . Burning pain 10/15/2016  . Chronic low back pain 07/18/2016  . Prediabetes 05/30/2016  . Cervicalgia 05/30/2016  . Right knee pain 05/30/2016  . Hair loss 05/30/2016  . Osteopenia 11/18/2015  . Thrombocytopenia ([place]) 05/30/2015  . Arthritis of left lower extremity 02/02/2015  . Bilateral knee pain 09/08/2014  . Gout 12/11/2013  . Obesity with serious comorbidity 11/06/2013  . Cough 07/15/2012  . Left lumbar radiculopathy 04/13/2012  . Renal cell cancer ([place])   . Hemorrhoids 11/14/2010  . IBS (irritable bowel syndrome) 11/14/2010  . GERD (gastroesophageal reflux disease) 11/14/2010  . HYPERCHOLESTEROLEMIA, MILD 03/16/2010  . Lymphedema 03/15/2010  . ALLERGIC RHINITIS 08/31/2009  . MIGRAINE HEADACHE 06/14/2009  . SINUSITIS, CHRONIC 06/14/2009  . SYNCOPE 05/16/2009  . Essential hypertension 05/06/2009  . CONSTIPATION, CHRONIC 05/06/2009    Current Outpatient Medications on File Prior to Visit  Medication Sig Dispense Refill  . alendronate (FOSAMAX) 70 MG tablet TAKE 1 TABLET ONCE A WEEK WITH A FULL GLASS OF WATER ON AN EMPTY STOMACH 12 tablet 1  . AMBULATORY NON FORMULARY MEDICATION 1 Units by Other route daily. Diabetic Shoes 1 Units 0  . amLODipine (NORVASC) 5 MG tablet TAKE 1 TABLET DAILY 90 tablet 1  . atorvastatin (LIPITOR) 10 MG  tablet TAKE 1 TABLET DAILY 90 tablet 1  . atropine 1 % ophthalmic solution     . blood glucose meter kit and supplies KIT Dispense based on patient and insurance preference. Use up to four times daily as directed. (FOR R73.03). 1 each 0  . calcium-vitamin D (OSCAL WITH D) 500-200 MG-UNIT tablet Take 1 tablet by mouth daily.     . chlorhexidine (PERIDEX) 0.12 % solution Rinse 1 capful and expectorate 1-2 times per day    . collagenase (SANTYL) ointment as needed.      . cromolyn (OPTICROM) 4 % ophthalmic solution as needed.     . Diclofenac Sodium 2 % SOLN Apply 1 pump twice daily. (Patient taking differently: Apply 1 application topically 2 (two) times daily as needed (PAIN). Apply 1 pump twice daily.) 112 g 1  . dicyclomine (BENTYL) 10 MG capsule Take 1-2 capsules (10-20 mg total) by mouth 3 (three) times daily as needed for spasms. 150 capsule 2  . fluticasone (FLONASE) 50 MCG/ACT nasal spray Place 1 spray into both nostrils daily. (Patient taking differently: Place 1 spray into both nostrils as needed. ) 16 g 2  . furosemide (LASIX) 20 MG tablet TAKE 1 TABLET DAILY 90 tablet 1  . hydrocortisone (ANUSOL-HC) 2.5 % rectal cream Place 1 application rectally 2 (two) times daily. 30 g 1  . lidocaine (LIDODERM) 5 % Place 1 patch onto the skin daily. Remove & Discard patch within 12 hours or as directed by MD 90 patch 1  . linaclotide (LINZESS) 145 MCG CAPS capsule Take 1 capsule (145 mcg total) by mouth daily before breakfast. 90 capsule 1  . losartan (COZAAR) 100 MG tablet losartan 100 mg tablet    . methocarbamol (ROBAXIN) 500 MG tablet Take 1 tablet (500 mg total) by mouth every 6 (six) hours as needed for muscle spasms. 30 tablet 2  . methylphenidate (RITALIN) 20 MG tablet Take 1 tablet (20 mg total) by mouth 2 (two) times daily with breakfast and lunch. 60 tablet 0  . Multiple Vitamin (MULTIVITAMIN) tablet Take 1 tablet by mouth daily.    . NONFORMULARY OR COMPOUNDED ITEM [facility]  Onychomycosis Nail Lacquer -  Fluconazole 2%, Terbinafine 1% DMSO Apply to affected nail once daily Qty. 120 gm 3 refills 1 each 2  . ondansetron (ZOFRAN) 4 MG tablet Take 4 mg by mouth 2 (two) times daily. As needed    . prednisoLONE acetate (PRED FORTE) 1 % ophthalmic suspension Place 1 drop into the right eye 4 (four) times daily.    . Prenatal Vit-Fe Fumarate-FA (PNV PRENATAL PLUS MULTIVITAMIN) 27-1 MG TABS TAKE 1 TABLET DAILY AT 12 NOON 60 tablet 3  . propranolol  (INDERAL) 80 MG tablet TAKE 1 TABLET TWICE A DAY 180 tablet 1  . telmisartan (MICARDIS) 80 MG tablet Take 1 tablet (80 mg total) by mouth daily. 90 tablet 0  . tolterodine (DETROL LA) 4 MG 24 hr capsule Take 1 capsule by mouth daily.    [name] venlafaxine XR (EFFEXOR-XR) 75 MG 24 hr capsule TAKE 3 CAPSULES BY MOUTH ONCE DAILY WITH BREAKFAST 90 capsule 0  . zolpidem (AMBIEN) 10 MG tablet Take 1 tablet (10 mg total) by mouth at bedtime as needed for sleep. 90 tablet 0   No current facility-administered medications on file prior to visit.     Past Medical History:  Diagnosis Date  . Allergic rhinitis, cause unspecified   . Anxiety   . Blood dyscrasia    platelets low  in past  . Chronic back pain   . Chronic constipation   . Chronic sinusitis   . Depression   . Endometriosis   . ENDOMETRIOSIS 05/06/2009   Qualifier: Diagnosis of  By: Varney Daily RN, Butch Penny    . Esophageal stricture   . GERD (gastroesophageal reflux disease)   . Hemorrhoids   . Hiatal hernia   . Hyperlipidemia   . Hypertension   . Irritable bowel syndrome   . Lymphedema   . Osteoarthritis   . Personal history of diabetes mellitus    controlled with diet only  . Renal cell cancer ([place]) 11/2010 dx   R, s/p cryoablation 02/16/11  . Syncopal episodes    since childhood    Past Surgical History:  Procedure Laterality Date  . BREAST CYST EXCISION Bilateral over 10 years ago   No visable scar   . BREAST SURGERY     bilateral, cyst removal  . CATARACT EXTRACTION, BILATERAL Bilateral 2020  . fallopian tube removed    . FUNCTIONAL ENDOSCOPIC SINUS SURGERY    . HERNIA REPAIR    . LASER ABLATION OF THE CERVIX    . 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 TOTAL KNEE ARTHROPLASTY Right 10/30/2016   Procedure: RIGHT TOTAL KNEE ARTHROPLASTY;  Surgeon: Meredith Pel, MD;  Location: [facility];  Service: Orthopedics;  Laterality: Right;  . TUBAL LIGATION      Social History   Socioeconomic History   . Marital status: Legally Separated    Spouse name: Not on file  . Number of children: Not on file  . Years of education: Not on file  . Highest education level: Not on file  Occupational History  . Occupation: Disabled  Social Needs  . Financial resource strain: Not on file  . Food insecurity    Worry: Not on file    Inability: Not on file  . Transportation needs    Medical: Not on file    Non-medical: Not on file  Tobacco Use  . Smoking status: Never Smoker  . Smokeless tobacco: Never Used  Substance and Sexual Activity  . Alcohol use: No    Comment: rare  . Drug use: No  . Sexual activity: Never  Lifestyle  . Physical activity    Days per week: Not on file    Minutes per session: Not on file  . Stress: Not on file  Relationships  . Social Herbalist on phone: Not on file    Gets together: Not on file    Attends religious service: Not on file    Active member of club or organization: Not on file    Attends meetings of clubs or organizations: Not on file    Relationship status: Not on file  Other Topics Concern  . Not on file  Social History Narrative  . Not on file    Family History  Problem Relation Age of Onset  . Diabetes Mother   . Hypertension Mother   . Hyperlipidemia Mother   . Heart disease Mother   . Stroke Mother   . Kidney disease Mother   . Thyroid disease Mother   . Sleep apnea Mother   . Obesity Mother   . Kidney disease Father   . Prostate cancer Father   . Alcoholism Father   . Alcohol abuse Father   . Colitis Brother   . Alcohol abuse Brother   . Leukemia Brother   .  Multiple sclerosis Sister   . Colon polyps Other   . Breast cancer Maternal Aunt   . Breast cancer Cousin   . Alcohol abuse Daughter   . Colon cancer Neg Hx     Review of Systems  Constitutional: Negative for chills and fever.  Respiratory: Negative for cough, shortness of breath and wheezing.   Cardiovascular: Positive for palpitations (with stress) and  leg swelling. Negative for chest pain.  Musculoskeletal: Positive for arthralgias and back pain.  Neurological: Positive for headaches (Occasional). Negative for light-headedness.       Objective:   Vitals:   03/10/19 1116  BP: 136/80  Pulse: 72  Resp: 18  Temp: 99.2 F (37.3 C)  SpO2: 99%   BP Readings from Last 3 Encounters:  03/10/19 136/80  02/09/19 116/80  01/19/19 128/66   Wt Readings from Last 3 Encounters:  03/10/19 251 lb 12.8 oz (114.2 kg)  02/09/19 248 lb 6 oz (112.7 kg)  01/19/19 250 lb 12.8 oz (113.8 kg)   Body mass index is 46.05 kg/m.   Physical Exam    Constitutional: Appears well-developed and well-nourished. No distress.  HENT:  Head: Normocephalic and atraumatic.  Neck: Neck supple. No tracheal deviation present. No thyromegaly present.  No cervical lymphadenopathy Cardiovascular: Normal rate, regular rhythm and normal heart sounds.  No murmur heard. No carotid bruit .  Mild bilateral lower extremity edema Pulmonary/Chest: Effort normal and breath sounds normal. No respiratory distress. No has no wheezes. No rales.  Skin: Skin is warm and dry. Not diaphoretic.  Psychiatric: Normal mood and affect. Behavior is normal.      Assessment & Plan:    See Problem List for Assessment and Plan of chronic medical problems.

## 2019-03-09 NOTE — Patient Instructions (Addendum)
  Tests ordered today. Your results will be released to [place] (or called to you) after review.  If any changes need to be made, you will be notified at that same time.   Flu immunization administered today.    Medications reviewed and updated.  Changes include :        Please followup in 6 months

## 2019-03-10 ENCOUNTER — Other Ambulatory Visit: Payer: Self-pay

## 2019-03-10 ENCOUNTER — Ambulatory Visit (INDEPENDENT_AMBULATORY_CARE_PROVIDER_SITE_OTHER): Payer: Medicare Other | Admitting: Internal Medicine

## 2019-03-10 ENCOUNTER — Other Ambulatory Visit (INDEPENDENT_AMBULATORY_CARE_PROVIDER_SITE_OTHER): Payer: Medicare Other

## 2019-03-10 ENCOUNTER — Encounter: Payer: Self-pay | Admitting: Internal Medicine

## 2019-03-10 VITALS — BP 136/80 | HR 72 | Temp 99.2°F | Resp 18 | Ht 62.0 in | Wt 251.8 lb

## 2019-03-10 DIAGNOSIS — M150 Primary generalized (osteo)arthritis: Secondary | ICD-10-CM

## 2019-03-10 DIAGNOSIS — R600 Localized edema: Secondary | ICD-10-CM

## 2019-03-10 DIAGNOSIS — M549 Dorsalgia, unspecified: Secondary | ICD-10-CM | POA: Diagnosis not present

## 2019-03-10 DIAGNOSIS — I10 Essential (primary) hypertension: Secondary | ICD-10-CM | POA: Diagnosis not present

## 2019-03-10 DIAGNOSIS — Z23 Encounter for immunization: Secondary | ICD-10-CM

## 2019-03-10 DIAGNOSIS — M8949 Other hypertrophic osteoarthropathy, multiple sites: Secondary | ICD-10-CM

## 2019-03-10 DIAGNOSIS — E7800 Pure hypercholesterolemia, unspecified: Secondary | ICD-10-CM

## 2019-03-10 DIAGNOSIS — R7303 Prediabetes: Secondary | ICD-10-CM

## 2019-03-10 DIAGNOSIS — M159 Polyosteoarthritis, unspecified: Secondary | ICD-10-CM | POA: Insufficient documentation

## 2019-03-10 DIAGNOSIS — D696 Thrombocytopenia, unspecified: Secondary | ICD-10-CM

## 2019-03-10 DIAGNOSIS — G8929 Other chronic pain: Secondary | ICD-10-CM

## 2019-03-10 DIAGNOSIS — M8580 Other specified disorders of bone density and structure, unspecified site: Secondary | ICD-10-CM

## 2019-03-10 HISTORY — DX: Polyosteoarthritis, unspecified: M15.9

## 2019-03-10 HISTORY — DX: Primary generalized (osteo)arthritis: M15.0

## 2019-03-10 HISTORY — DX: Other hypertrophic osteoarthropathy, multiple sites: M89.49

## 2019-03-10 LAB — CBC WITH DIFFERENTIAL/PLATELET
Basophils Absolute: 0 10*3/uL (ref 0.0–0.1)
Basophils Relative: 0.6 % (ref 0.0–3.0)
Eosinophils Absolute: 0.1 10*3/uL (ref 0.0–0.7)
Eosinophils Relative: 1.8 % (ref 0.0–5.0)
HCT: 37.9 % (ref 36.0–46.0)
Hemoglobin: 12.4 g/dL (ref 12.0–15.0)
Lymphocytes Relative: 26.7 % (ref 12.0–46.0)
Lymphs Abs: 2 10*3/uL (ref 0.7–4.0)
MCHC: 32.9 g/dL (ref 30.0–36.0)
MCV: 94.9 fl (ref 78.0–100.0)
Monocytes Absolute: 0.5 10*3/uL (ref 0.1–1.0)
Monocytes Relative: 7.2 % (ref 3.0–12.0)
Neutro Abs: 4.8 10*3/uL (ref 1.4–7.7)
Neutrophils Relative %: 63.7 % (ref 43.0–77.0)
Platelets: 141 10*3/uL — ABNORMAL LOW (ref 150.0–400.0)
RBC: 3.99 Mil/uL (ref 3.87–5.11)
RDW: 15.4 % (ref 11.5–15.5)
WBC: 7.5 10*3/uL (ref 4.0–10.5)

## 2019-03-10 LAB — COMPREHENSIVE METABOLIC PANEL
ALT: 12 U/L (ref 0–35)
AST: 14 U/L (ref 0–37)
Albumin: 3.9 g/dL (ref 3.5–5.2)
Alkaline Phosphatase: 107 U/L (ref 39–117)
BUN: 20 mg/dL (ref 6–23)
CO2: 30 mEq/L (ref 19–32)
Calcium: 10.3 mg/dL (ref 8.4–10.5)
Chloride: 103 mEq/L (ref 96–112)
Creatinine, Ser: 1.04 mg/dL (ref 0.40–1.20)
GFR: 64.63 mL/min (ref >60.00)
Glucose, Bld: 86 mg/dL (ref 70–99)
Potassium: 4.3 mEq/L (ref 3.5–5.1)
Sodium: 138 mEq/L (ref 135–145)
Total Bilirubin: 0.3 mg/dL (ref 0.2–1.2)
Total Protein: 7.3 g/dL (ref 6.0–8.3)

## 2019-03-10 LAB — LIPID PANEL
Cholesterol: 137 mg/dL (ref 0–200)
HDL: 52.1 mg/dL (ref >39.00)
LDL Cholesterol: 67 mg/dL (ref 0–99)
NonHDL: 84.88
Total CHOL/HDL Ratio: 3
Triglycerides: 88 mg/dL (ref 0.0–149.0)
VLDL: 17.6 mg/dL (ref 0.0–40.0)

## 2019-03-10 LAB — HEMOGLOBIN A1C: Hgb A1c MFr Bld: 5.7 % (ref 4.6–6.5)

## 2019-03-10 MED ORDER — BLOOD GLUCOSE MONITOR KIT: PACK | 0 refills | Status: DC

## 2019-03-10 NOTE — Assessment & Plan Note (Addendum)
Stable Stressed low-sodium diet Elevate legs when sitting Encourage more regular exercise, weight loss

## 2019-03-10 NOTE — Assessment & Plan Note (Signed)
BP well controlled Current regimen effective and well tolerated Continue current medications at current doses cmp  

## 2019-03-10 NOTE — Assessment & Plan Note (Signed)
Chronic lower back pain Has seen orthopedics Has difficulty getting up and out of a chair and would like to have a recliner-she believes her insurance would cover this-we will give a prescription Stressed regular exercise and weight loss

## 2019-03-10 NOTE — Assessment & Plan Note (Addendum)
Took alendronate 2014 - 2019 Not currently exercising DEXA up-to-date Encourage regular exercise Continue calcium and vitamin D

## 2019-03-10 NOTE — Assessment & Plan Note (Signed)
Check a1c Low sugar / carb diet Stressed regular exercise  New prescription for glucometer given

## 2019-03-10 NOTE — Assessment & Plan Note (Signed)
Check lipids, CMP Continue statin

## 2019-03-10 NOTE — Assessment & Plan Note (Signed)
Has arthritis of several joints including her knees and hips.  This makes it very difficult for her to get around-currently using a cane She also has difficulty getting in and out of a chair because her furniture is so low.  She would benefit from a recliner, especially when that would help lift her up She thinks this will be covered with her insurance and would like a prescription, which I will provide Stressed regular exercise and weight loss, which will also help

## 2019-03-10 NOTE — Assessment & Plan Note (Signed)
CBC

## 2019-03-11 ENCOUNTER — Encounter: Payer: Self-pay | Admitting: Internal Medicine

## 2019-03-16 ENCOUNTER — Other Ambulatory Visit: Payer: Self-pay

## 2019-03-16 MED ORDER — TELMISARTAN 80 MG PO TABS: 80.0000 mg | ORAL_TABLET | Freq: Every day | ORAL | 0 refills | Status: DC

## 2019-03-17 ENCOUNTER — Telehealth (HOSPITAL_COMMUNITY): Payer: Self-pay

## 2019-03-17 ENCOUNTER — Encounter: Payer: Self-pay | Admitting: Internal Medicine

## 2019-03-17 ENCOUNTER — Ambulatory Visit (INDEPENDENT_AMBULATORY_CARE_PROVIDER_SITE_OTHER): Payer: Medicare Other | Admitting: Internal Medicine

## 2019-03-17 ENCOUNTER — Ambulatory Visit (INDEPENDENT_AMBULATORY_CARE_PROVIDER_SITE_OTHER): Payer: Medicare Other | Admitting: Psychiatry

## 2019-03-17 ENCOUNTER — Other Ambulatory Visit: Payer: Self-pay

## 2019-03-17 VITALS — BP 130/78 | HR 80 | Temp 97.4°F | Ht 62.0 in | Wt 245.4 lb

## 2019-03-17 DIAGNOSIS — F4312 Post-traumatic stress disorder, chronic: Secondary | ICD-10-CM | POA: Diagnosis not present

## 2019-03-17 DIAGNOSIS — F341 Dysthymic disorder: Secondary | ICD-10-CM

## 2019-03-17 DIAGNOSIS — K648 Other hemorrhoids: Secondary | ICD-10-CM | POA: Diagnosis not present

## 2019-03-17 DIAGNOSIS — F909 Attention-deficit hyperactivity disorder, unspecified type: Secondary | ICD-10-CM | POA: Diagnosis not present

## 2019-03-17 MED ORDER — VENLAFAXINE HCL ER 75 MG PO CP24: 225.0000 mg | ORAL_CAPSULE | Freq: Every day | ORAL | 0 refills | Status: DC

## 2019-03-17 MED ORDER — METHYLPHENIDATE HCL 20 MG PO TABS: 30.0000 mg | ORAL_TABLET | Freq: Two times a day (BID) | ORAL | 0 refills | Status: DC

## 2019-03-17 MED ORDER — LINACLOTIDE 145 MCG PO CAPS: 145.0000 ug | ORAL_CAPSULE | Freq: Every day | ORAL | 1 refills | Status: DC

## 2019-03-17 NOTE — Addendum Note (Signed)
Addended by: Maudry Mayhew on: 03/17/2019 04:10 PM   Modules accepted: Orders

## 2019-03-17 NOTE — Telephone Encounter (Signed)
Patient called and she would like her medication to be resent to [facility] instead of [facility] - she said if [facility] sends it, it will take two weeks.

## 2019-03-17 NOTE — Patient Instructions (Addendum)
You have been scheduled for your 2nd banding on 04/27/2019 at 4:00 pm.  We have sent the following medications to your pharmacy for you to pick up at your convenience: [place]   1. The procedure you have had should have been relatively painless since the banding of the area involved does not have nerve endings and there is no pain sensation.  The rubber band cuts off the blood supply to the hemorrhoid and the band may fall off as soon as 48 hours after the banding (the band may occasionally be seen in the toilet bowl following a bowel movement). You may notice a temporary feeling of fullness in the rectum which should respond adequately to plain Tylenol or Motrin.  2. Following the banding, avoid strenuous exercise that evening and resume full activity the next day.  A sitz bath (soaking in a warm tub) or bidet is soothing, and can be useful for cleansing the area after bowel movements.     3. To avoid constipation, take two tablespoons of natural wheat bran, natural oat bran, flax, Benefiber or any over the counter fiber supplement and increase your water intake to 7-8 glasses daily.    4. Unless you have been prescribed anorectal medication, do not put anything inside your rectum for two weeks: No suppositories, enemas, fingers, etc.  5. Occasionally, you may have more bleeding than usual after the banding procedure.  This is often from the untreated hemorrhoids rather than the treated one.  Don't be concerned if there is a tablespoon or so of blood.  If there is more blood than this, lie flat with your bottom higher than your head and apply an ice pack to the area. If the bleeding does not stop within a half an hour or if you feel faint, call our office at [phone] or go to the emergency room.  6. Problems are not common; however, if there is a substantial amount of bleeding, severe pain, chills, fever or difficulty passing  urine (very rare) or other problems, you should call us at [phone] or report to the nearest emergency room.  7. Do not stay seated continuously for more than 2-3 hours for a day or two after the procedure.  Tighten your buttock muscles 10-15 times every two hours and take 10-15 deep breaths every 1-2 hours.  Do not spend more than a few minutes on the toilet if you cannot empty your bowel; instead re-visit the toilet at a later time.    If you are age 2 or older, your body mass index should be between 23-30. Your Body mass index is 44.88 kg/m. If this is out of the aforementioned range listed, please consider follow up with your Primary Care Provider.  If you are age 70 or younger, your body mass index should be between 19-25. Your Body mass index is 44.88 kg/m. If this is out of the aformentioned range listed, please consider follow up with your Primary Care Provider.
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
## 2019-03-17 NOTE — Telephone Encounter (Signed)
I did not even notice that pharmacy changed to [facility]. I will resend it.

## 2019-03-18 ENCOUNTER — Encounter: Payer: Self-pay | Admitting: Pulmonary Disease

## 2019-03-18 NOTE — Telephone Encounter (Signed)
Sent patient MyChart message to call the office to schedule covid testing prior to PFT.  We have been unable to reach patient over the phone.

## 2019-03-18 NOTE — Progress Notes (Signed)
Janet Le returns for follow-up today for consideration of hemorrhoidal banding. She is here alone today I saw her 1 month ago where we discussed her abdominal pain felt IBS related, chronic constipation and internal hemorrhoids.  She reports that the Bentyl has significantly helped the abdominal pain and it has resolved.  Linzess is working well for her chronic constipation.  Regarding her hemorrhoids she has dealt with symptomatic internal hemorrhoids for many years.  She does have intermittent bleeding and prolapse.   PROCEDURE NOTE:  The patient presents with symptomatic grade 2 internal hemorrhoids, requesting rubber band ligation of her hemorrhoidal disease.  All risks, benefits and alternative forms of therapy were described and informed consent was obtained.   The anorectum was pre-medicated with 0.125% nitroglycerin ointment The decision was made to band the LL internal hemorrhoid, and the [place] was used to perform band ligation without complication.   Digital anorectal examination was then performed to assure proper positioning of the band, and to adjust the banded tissue as required.  The patient was discharged home without pain or other issues.  Dietary and behavioral recommendations were given and along with follow-up instructions.     The following adjunctive treatments were recommended: Continue Linzess 145 mcg daily Continue Bentyl 10 to 20 mg 2-3 times a day as needed for crampy abdominal pain/IBS  The patient will return as scheduled for follow-up and possible additional banding as required. No complications were encountered and the patient tolerated the procedure well.

## 2019-03-20 ENCOUNTER — Other Ambulatory Visit: Payer: Self-pay

## 2019-03-20 MED ORDER — BLOOD GLUCOSE MONITOR KIT: PACK | 0 refills | Status: DC

## 2019-03-23 ENCOUNTER — Telehealth: Payer: Self-pay | Admitting: Internal Medicine

## 2019-03-23 ENCOUNTER — Other Ambulatory Visit: Payer: Self-pay | Admitting: Internal Medicine

## 2019-03-23 ENCOUNTER — Telehealth: Payer: Self-pay

## 2019-03-23 MED ORDER — BLOOD GLUCOSE MONITOR KIT: PACK | 0 refills | Status: DC

## 2019-03-23 NOTE — Telephone Encounter (Signed)
Copied from [facility] [id number]. Topic: General - Other >> Mar 23, 2019 11:57 AM Yvette Rack wrote: Reason for CRM: Pt stated that [place] needs the code for the glucose monitor so it can be covered by her insurance.Pt requests that the code for the glucose monitor be sent to [place] ([place]), [place] - [address]

## 2019-03-23 NOTE — Telephone Encounter (Signed)
Requested medication (s) are due for refill today: yes  Requested medication (s) are on the active medication list: yes  Last refill: 03/17/2019  Future visit scheduled: yes  Notes to clinic:  Review for refill   Requested Prescriptions  Pending Prescriptions Disp Refills   venlafaxine XR (EFFEXOR-XR) 75 MG 24 hr capsule 270 capsule 0    Sig: Take 3 capsules (225 mg total) by mouth daily with breakfast.     Psychiatry: Antidepressants - SNRI - desvenlafaxine & venlafaxine Passed - 03/23/2019  2:19 PM      Passed - LDL in normal range and within 360 days    LDL (calc)  Date Value Ref Range Status  03/11/2009 13 mg/dL    LDL Cholesterol  Date Value Ref Range Status  03/10/2019 67 0 - 99 mg/dL Final         Passed - Total Cholesterol in normal range and within 360 days    Cholesterol  Date Value Ref Range Status  03/10/2019 137 0 - 200 mg/dL Final    Comment:    ATP III Classification       Desirable:  < 200 mg/dL               Borderline High:  200 - 239 mg/dL          High:  > = 240 mg/dL         Passed - Triglycerides in normal range and within 360 days    Triglycerides  Date Value Ref Range Status  03/10/2019 88.0 0.0 - 149.0 mg/dL Final    Comment:    Normal:  <150 mg/dLBorderline High:  150 - 199 mg/dL   Triglyceride fasting, serum  Date Value Ref Range Status  03/11/2009 66 mg/dL          Passed - Last BP in normal range    BP Readings from Last 1 Encounters:  03/17/19 130/78         Passed - Valid encounter within last 6 months    Recent Outpatient Visits          1 week ago Essential hypertension   [place], Claudina Lick, MD   6 months ago Anxiety and depression   [place], Claudina Lick, MD   9 months ago Anxiety and depression   [place], Claudina Lick, MD   1 year ago Essential hypertension   [place], Stacy J, MD   1 year  ago Essential hypertension   [facility] -Genene Churn, MD      Future Appointments            In 3 days Laurin Coder, MD [facility]   In 1 month [place], Claudina Lick, MD [place], [place] - Completed PHQ-2 or PHQ-9 in the last 360 days.       methylphenidate (RITALIN) 20 MG tablet 90 tablet 0    Sig: Take 1.5 tablets (30 mg total) by mouth 2 (two) times daily with breakfast and lunch.     Not Delegated - Psychiatry:  Stimulants/ADHD Failed - 03/23/2019  2:19 PM      Failed - This refill cannot be delegated      Failed - Urine Drug Screen completed in last 360 days.      Passed -  Valid encounter within last 3 months    Recent Outpatient Visits          1 week ago Essential hypertension   [place], Claudina Lick, MD   6 months ago Anxiety and depression   [place], Claudina Lick, MD   9 months ago Anxiety and depression   [place], Claudina Lick, MD   1 year ago Essential hypertension   [facility] [place], Claudina Lick, MD   1 year ago Essential hypertension   [place], Stacy J, MD      Future Appointments            In 3 days Laurin Coder, MD [facility]   In 1 month [place], Claudina Lick, MD [place], [place]

## 2019-03-23 NOTE — Telephone Encounter (Signed)
Pharmacy called stating the diagnosis code was wrong and your needing the prescription dated. They state they are needing a completely new prescription sent in.Please advise.    [place] ([place]), [place] - [address]  [address] [place] ([place]) [place] [address]  Phone: [phone] Fax: [phone]  Not a 24 hour pharmacy; exact hours not known.

## 2019-03-23 NOTE — Telephone Encounter (Signed)
Patient is calling to see if Dr. Quay Burow can order a home health aide. The patient states that she is taking a lot of medication. It is hard for her to remember how to take it all.   Also can Dr. Quay Burow help with the patient getting meals on wheels. Please advise CB- [phone]

## 2019-03-23 NOTE — Telephone Encounter (Signed)
Pt is concerned that her second hemorrhoid banding is in November. She would like a call to discuss.

## 2019-03-23 NOTE — Telephone Encounter (Signed)
Patient needs a weeks supply of the following medications until her mail order comes in, and she would like to have the prescriptions sent to her preferred pharmacy [facility]:  telmisartan (MICARDIS) 80 MG tablet methylphenidate (RITALIN) 20 MG tablet, pt said the provider changed this to 30 MG tablets venlafaxine XR (EFFEXOR-XR) 75 MG 24 hr capsule

## 2019-03-23 NOTE — Telephone Encounter (Signed)
Patient is requesting a weeks supply of the below medications ,see previous encounter below.The patient also states the dose for Ritalin was changed to 30 mg.

## 2019-03-23 NOTE — Telephone Encounter (Signed)
Medication: venlafaxine XR (EFFEXOR-XR) 75 MG 24 hr capsule [id number], methylphenidate (RITALIN) 30 MG tablet [id number]   Has the patient contacted their pharmacy? Yes  (Agent: If no, request that the patient contact the pharmacy for the refill.) (Agent: If yes, when and what did the pharmacy advise?)    Preferred Pharmacy (with phone number or street name): [place] ([place]), [place] - [address] [phone] (Phone) [phone] (Fax)  Agent: Please be advised that RX refills may take up to 3 business days. We ask that you follow-up with your pharmacy.

## 2019-03-23 NOTE — Telephone Encounter (Signed)
Do you have any resources for her? Thank you for any help.

## 2019-03-23 NOTE — Telephone Encounter (Signed)
Every script that has been sent for the glucometer has had the dx code on it. Code has been circled again and written on the script.

## 2019-03-23 NOTE — Telephone Encounter (Signed)
Rx sent 

## 2019-03-23 NOTE — Telephone Encounter (Signed)
Pts second banding appt moved to 10/27@4pm , she is aware.

## 2019-03-25 ENCOUNTER — Telehealth: Payer: Self-pay | Admitting: *Deleted

## 2019-03-25 ENCOUNTER — Telehealth: Payer: Self-pay | Admitting: Neurology

## 2019-03-25 DIAGNOSIS — M8949 Other hypertrophic osteoarthropathy, multiple sites: Secondary | ICD-10-CM

## 2019-03-25 DIAGNOSIS — M159 Polyosteoarthritis, unspecified: Secondary | ICD-10-CM

## 2019-03-25 NOTE — Telephone Encounter (Signed)
[employer] called on behalf of the patient. Doctor Tomi Likens had recommended that the patient get Diabetic Shoes. Her Insurance company called to let us know who is in Network with them. He said [place] [phone]. He is asking if the Prescription can be Faxed for [employer]. The address is [address] [place]. He also mentioned if any issues can also call Pediatrist. Thanks

## 2019-03-25 NOTE — Telephone Encounter (Signed)
Called patient who explained that she was speaking with a representative from her insurance that was assisting her with the process of how to apply to receive a nurse aide and patient felt that she was getting her questions answered. Patient did ask that Dr. Quay Burow fax an order for a lift chair to [facility]. Fax # [phone]. Nurse requested that the patient contact our office if she felt she needed any further assistance.

## 2019-03-25 NOTE — Telephone Encounter (Addendum)
Called patient in response to a staff message regarding patient wanting to inquire about getting a nurse aide and Meals on Wheels. Patient answered and explained that she was not able to receive the call at this time. Nurse stated that she would call patient back later.

## 2019-03-26 ENCOUNTER — Other Ambulatory Visit: Payer: Self-pay | Admitting: *Deleted

## 2019-03-26 ENCOUNTER — Ambulatory Visit: Payer: Medicare Other | Admitting: Pulmonary Disease

## 2019-03-26 DIAGNOSIS — R7303 Prediabetes: Secondary | ICD-10-CM

## 2019-03-26 NOTE — Telephone Encounter (Signed)
I would like to proceed with faxing over the prescription

## 2019-03-26 NOTE — Telephone Encounter (Signed)
Sent Rx. [place] [id number]-diabetic shoes

## 2019-03-26 NOTE — Progress Notes (Unsigned)
dme

## 2019-03-26 NOTE — Telephone Encounter (Signed)
Please advise 

## 2019-03-26 NOTE — Telephone Encounter (Signed)
printed

## 2019-03-27 NOTE — Telephone Encounter (Signed)
Rx has been faxed.

## 2019-04-03 ENCOUNTER — Telehealth: Payer: Self-pay | Admitting: Neurology

## 2019-04-03 NOTE — Telephone Encounter (Signed)
Patient is calling to check the status of her  Prosthetics  Shoes please  Call

## 2019-04-06 NOTE — Telephone Encounter (Signed)
Called spoke with patient she was made aware of Janet Le note and to call to check on status of order. Pt will call them regarding her shoes

## 2019-04-06 NOTE — Telephone Encounter (Signed)
[facility] [place], [place]     3:32 PM Note   Sent Rx. [place] [id number]-diabetic shoes

## 2019-04-14 NOTE — Telephone Encounter (Signed)
Called patient informed her to contact [facility] this was sent by Vicente Males weeks ago. Pt was informed of this. She was given there contact information. She states that she may not use them and will need sent somewhere else. She will keep our office updated.

## 2019-04-14 NOTE — Telephone Encounter (Signed)
Patient is calling to check the status of the Diabetic shoes she has not heard anything please call

## 2019-04-15 ENCOUNTER — Telehealth: Payer: Self-pay | Admitting: Neurology

## 2019-04-15 MED ORDER — AMBULATORY NON FORMULARY MEDICATION: 1.0000 [IU] | Freq: Every day | 0 refills | Status: DC

## 2019-04-15 NOTE — Telephone Encounter (Signed)
Patient is needing the diabetic shoes prescription faxed over to [phone]. AttnCorene Cornea. Thanks!

## 2019-04-15 NOTE — Telephone Encounter (Signed)
Noted Printed orders  Will need provider signature will have to wait until tomorrow. Will fax once signed

## 2019-04-21 ENCOUNTER — Encounter: Payer: Self-pay | Admitting: Internal Medicine

## 2019-04-21 ENCOUNTER — Ambulatory Visit: Payer: Medicare Other | Admitting: Internal Medicine

## 2019-04-21 ENCOUNTER — Other Ambulatory Visit: Payer: Self-pay

## 2019-04-21 ENCOUNTER — Other Ambulatory Visit (INDEPENDENT_AMBULATORY_CARE_PROVIDER_SITE_OTHER): Payer: Medicare Other

## 2019-04-21 VITALS — BP 120/72 | HR 71 | Temp 97.8°F | Ht 62.0 in | Wt 240.0 lb

## 2019-04-21 DIAGNOSIS — K5909 Other constipation: Secondary | ICD-10-CM | POA: Diagnosis not present

## 2019-04-21 DIAGNOSIS — K648 Other hemorrhoids: Secondary | ICD-10-CM

## 2019-04-21 DIAGNOSIS — R1010 Upper abdominal pain, unspecified: Secondary | ICD-10-CM

## 2019-04-21 DIAGNOSIS — R109 Unspecified abdominal pain: Secondary | ICD-10-CM

## 2019-04-21 LAB — BUN: BUN: 11 mg/dL (ref 6–23)

## 2019-04-21 LAB — CREATININE, SERUM: Creatinine, Ser: 0.92 mg/dL (ref 0.40–1.20)

## 2019-04-21 MED ORDER — POLYETHYLENE GLYCOL 3350 17 GM/SCOOP PO POWD: ORAL | 1 refills | Status: DC

## 2019-04-21 MED ORDER — DICYCLOMINE HCL 10 MG PO CAPS: 10.0000 mg | ORAL_CAPSULE | Freq: Three times a day (TID) | ORAL | 2 refills | Status: DC | PRN

## 2019-04-21 NOTE — Progress Notes (Signed)
  Janet Le is a 63 year old female with a history of chronic constipation, GERD, history of renal cell carcinoma status post cryoablation, diabetes, obesity and symptomatic hemorrhoids who returns for repeat hemorrhoidal banding.  Hemorrhoidal banding #1 occurred on 03/17/2019 to the left lateral internal hemorrhoid Tolerated this well and hemorrhoidal symptoms have improved Symptoms prior to banding included intermittent bleeding and prolapse  She does complain of upper abdominal pain and left-sided abdominal pain today.  Pain is under bilateral breasts but more on the left side.  Previously had been using Bentyl with success but not as helpful today.  She is not feeling well.  Bowel movements have been more regular without blood or melena with Linzess.  She would like MiraLAX as a backup if Linzess is not effective.  PROCEDURE NOTE:  The patient presents with symptomatic grade 2 internal hemorrhoids, requesting rubber band ligation of her hemorrhoidal disease.  All risks, benefits and alternative forms of therapy were described and informed consent was obtained.   The anorectum was pre-medicated with 0.125% nitroglycerin ointment The decision was made to band the RA (LL banded on 03/17/2019) internal hemorrhoid, and the [place] was used to perform band ligation without complication.   Digital anorectal examination was then performed to assure proper positioning of the band, and to adjust the banded tissue as required.  The patient was discharged home without pain or other issues.  Dietary and behavioral recommendations were given and along with follow-up instructions.     The following adjunctive treatments were recommended: Continue Linzess 145 mcg daily Okay to use MiraLAX 17 g daily as needed Continue Bentyl 10 to 20 mg 2-3 times a day for crampy abdominal pain/IBS She is tender in the left abdomen and upper abdomen today.  I recommended we proceed with a CT scan of the  abdomen pelvis with IV contrast for further evaluation  The patient will return as scheduled for  follow-up and possible additional banding as required. No complications were encountered and the patient tolerated the procedure well.

## 2019-04-21 NOTE — Patient Instructions (Addendum)
Your provider has requested that you go to the basement level for lab work before leaving today. Press "B" on the elevator. The lab is located at the first door on the left as you exit the elevator. ____________________________________________________________ Janet Le have been scheduled for a CT scan of the abdomen and pelvis at [facility] (1st floor of hospital).   You are scheduled on Wednesday, 04/29/2019 at 9:30 am. You should arrive 15 minutes prior to your appointment time for registration. Please follow the written instructions below on the day of your exam:  WARNING: IF YOU ARE ALLERGIC TO IODINE/X-RAY DYE, PLEASE NOTIFY RADIOLOGY IMMEDIATELY AT [phone]! YOU WILL BE GIVEN A 13 HOUR PREMEDICATION PREP.  1) Do not eat or drink anything after 5:30 am (4 hours prior to your test) 2) You have been given 2 bottles of oral contrast to drink. The solution may taste better if refrigerated, but do NOT add ice or any other liquid to this solution. Shake well before drinking.    Drink 1 bottle of contrast @ 7:30 am (2 hours prior to your exam)  Drink 1 bottle of contrast @ 8:30 am (1 hour prior to your exam)  You may take any medications as prescribed with a small amount of water, if necessary. If you take any of the following medications: METFORMIN, GLUCOPHAGE, GLUCOVANCE, AVANDAMET, RIOMET, FORTAMET, Grover MET, JANUMET, GLUMETZA or METAGLIP, you MAY be asked to HOLD this medication 48 hours AFTER the exam.  The purpose of you drinking the oral contrast is to aid in the visualization of your intestinal tract. The contrast solution may cause some diarrhea. Depending on your individual set of symptoms, you may also receive an intravenous injection of x-ray contrast/dye. Plan on being at [facility] for 30 minutes or longer, depending on the type of exam you are having performed.  This test typically takes 30-45 minutes to complete.  If you have any questions regarding your exam or  if you need to reschedule, you may call the CT department at [phone] between the hours of 8:00 am and 5:00 pm, Monday-Friday.  _____________________________________________________________   Janet Le PROCEDURE    FOLLOW-UP CARE   1. The procedure you have had should have been relatively painless since the banding of the area involved does not have nerve endings and there is no pain sensation.  The rubber band cuts off the blood supply to the hemorrhoid and the band may fall off as soon as 48 hours after the banding (the band may occasionally be seen in the toilet bowl following a bowel movement). You may notice a temporary feeling of fullness in the rectum which should respond adequately to plain Tylenol or Motrin.  2. Following the banding, avoid strenuous exercise that evening and resume full activity the next day.  A sitz bath (soaking in a warm tub) or bidet is soothing, and can be useful for cleansing the area after bowel movements.     3. To avoid constipation, take two tablespoons of natural wheat bran, natural oat bran, flax, Benefiber or any over the counter fiber supplement and increase your water intake to 7-8 glasses daily.    4. Unless you have been prescribed anorectal medication, do not put anything inside your rectum for two weeks: No suppositories, enemas, fingers, etc.  5. Occasionally, you may have more bleeding than usual after the banding procedure.  This is often from the untreated hemorrhoids rather than the treated one.  Don't be concerned if there is  a tablespoon or so of blood.  If there is more blood than this, lie flat with your bottom higher than your head and apply an ice pack to the area. If the bleeding does not stop within a half an hour or if you feel faint, call our office at [phone] or go to the emergency room.  6. Problems are not common; however, if there is a substantial amount of bleeding, severe pain, chills, fever or difficulty  passing urine (very rare) or other problems, you should call us at [phone] or report to the nearest emergency room.  7. Do not stay seated continuously for more than 2-3 hours for a day or two after the procedure.  Tighten your buttock muscles 10-15 times every two hours and take 10-15 deep breaths every 1-2 hours.  Do not spend more than a few minutes on the toilet if you cannot empty your bowel; instead re-visit the toilet at a later time.  ___________________________________________________________  Janet Le.  Continue Bentyl.  We have sent the following medications to your pharmacy for you to pick up at your convenience: Miralax.

## 2019-04-21 NOTE — Progress Notes (Signed)
Virtual Visit via Video Note  I connected with Janet Le on 04/21/19 at 11:15 AM EDT by a video enabled telemedicine application and verified that I am speaking with the correct person using two identifiers.   I discussed the limitations of evaluation and management by telemedicine and the availability of in person appointments. The patient expressed understanding and agreed to proceed.  The patient is currently at home and I am in the office.    No referring provider.    History of Present Illness: She is here for follow up of her chronic medical conditions.     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    She needs assistance getting up form a chair especially if the chair is low.  She has been following with orthopedics, but has not seen them in a while.  She has had injections in the past and that did help..  She has not done PT because she can not afford it.  She is doing some exercise at home.  She is working on weight loss.  Lymphedema:  She states this is stable.  She is exercising and is taking her lasix daily.  She is wearing compression socks.  She would like a prescription for the compression socks.   Obesity: She is working on weight loss.  She is paying more attention to what and when she eats.  She is exercising. She has lost some weight.   Constipation:  She is still trying to improve her constipation.  She has upper abdominal pain.  She is seeing GI.  She is taking Linzess.  She is trying to eat more foods that promote normal bowel movements.   Social History   Socioeconomic History  . Marital status: Legally Separated    Spouse name: Not on file  . Number of children: 4  . Years of education: Not on file  . Highest education level: Not on file  Occupational History  . Occupation: Disabled  Social Needs  . Financial resource strain: Not on file  . Food insecurity    Worry: Not on file    Inability: Not on file  .  Transportation needs    Medical: Not on file    Non-medical: Not on file  Tobacco Use  . Smoking status: Never Smoker  . Smokeless tobacco: Never Used  Substance and Sexual Activity  . Alcohol use: No    Comment: rare  . Drug use: No  . Sexual activity: Never  Lifestyle  . Physical activity    Days per week: Not on file    Minutes per session: Not on file  . Stress: Not on file  Relationships  . Social Herbalist on phone: Not on file    Gets together: Not on file    Attends religious service: Not on file    Active member of club or organization: Not on file    Attends meetings of clubs or organizations: Not on file    Relationship status: Not on file  Other Topics Concern  . Not on file  Social History Narrative  . Not on file     Observations/Objective: Appears well in NAD Breathing normally Normal mood and affect  Assessment and Plan:  See Problem List for Assessment and Plan of chronic medical problems.   Follow Up Instructions:    I discussed the assessment and treatment plan with the patient. The patient was provided an  opportunity to ask questions and all were answered. The patient agreed with the plan and demonstrated an understanding of the instructions.   The patient was advised to call back or seek an in-person evaluation if the symptoms worsen or if the condition fails to improve as anticipated.    Binnie Rail, MD

## 2019-04-22 ENCOUNTER — Ambulatory Visit (INDEPENDENT_AMBULATORY_CARE_PROVIDER_SITE_OTHER): Payer: Medicare Other | Admitting: Internal Medicine

## 2019-04-22 ENCOUNTER — Encounter: Payer: Self-pay | Admitting: Internal Medicine

## 2019-04-22 DIAGNOSIS — M8949 Other hypertrophic osteoarthropathy, multiple sites: Secondary | ICD-10-CM

## 2019-04-22 DIAGNOSIS — I890 Lymphedema, not elsewhere classified: Secondary | ICD-10-CM | POA: Diagnosis not present

## 2019-04-22 DIAGNOSIS — Z6841 Body Mass Index (BMI) 40.0 and over, adult: Secondary | ICD-10-CM

## 2019-04-22 DIAGNOSIS — K581 Irritable bowel syndrome with constipation: Secondary | ICD-10-CM

## 2019-04-22 DIAGNOSIS — M159 Polyosteoarthritis, unspecified: Secondary | ICD-10-CM

## 2019-04-22 MED ORDER — MEDICAL COMPRESSION SOCKS MISC: 0 refills | Status: DC

## 2019-04-22 NOTE — Assessment & Plan Note (Signed)
Chronic, stable She is working on weight loss and has lost some weight She is exercising Continue Lasix 20 mg daily Stressed compression socks on a daily basis We will provide a prescription

## 2019-04-22 NOTE — Assessment & Plan Note (Signed)
IBS with constipation Following with GI Taking Linzess Trying to eat more foods that help with constipation Continue regular exercise, increase water intake

## 2019-04-22 NOTE — Assessment & Plan Note (Signed)
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  May benefit from another injection in her joints She is doing some exercise-stressed the importance of continuing this Working on weight loss-encouraged weight loss efforts She would benefit from a lift chair-paperwork filled out and will be sent today

## 2019-04-22 NOTE — Assessment & Plan Note (Signed)
She is working on weight loss and has successfully lost some weight She is paying attention to what and when she is eating She is exercising regularly Stressed to continue efforts at weight loss

## 2019-04-27 ENCOUNTER — Encounter: Payer: Medicare Other | Admitting: Internal Medicine

## 2019-04-29 ENCOUNTER — Other Ambulatory Visit: Payer: Self-pay

## 2019-04-29 ENCOUNTER — Ambulatory Visit (HOSPITAL_COMMUNITY): Payer: Medicare Other | Admitting: Psychiatry

## 2019-04-29 ENCOUNTER — Ambulatory Visit (HOSPITAL_COMMUNITY)
Admission: RE | Admit: 2019-04-29 | Discharge: 2019-04-29 | Disposition: A | Discharge status: Home / Self Care | Payer: Medicare Other | Source: Ambulatory Visit | Attending: Internal Medicine | Admitting: Internal Medicine

## 2019-04-29 DIAGNOSIS — R1010 Upper abdominal pain, unspecified: Secondary | ICD-10-CM | POA: Diagnosis not present

## 2019-04-29 MED ORDER — IOHEXOL 300 MG/ML  SOLN
100.0000 mL | Freq: Once | INTRAMUSCULAR | Status: AC | PRN
  Administered 2019-04-29: 100 mL via INTRAVENOUS

## 2019-05-04 ENCOUNTER — Encounter: Payer: Self-pay | Admitting: Internal Medicine

## 2019-05-04 ENCOUNTER — Telehealth: Payer: Self-pay | Admitting: Internal Medicine

## 2019-05-04 ENCOUNTER — Telehealth: Payer: Self-pay | Admitting: Pulmonary Disease

## 2019-05-04 NOTE — Telephone Encounter (Signed)
See note below from Dr. Hilarie Fredrickson from her CT result:  Notes recorded by Jerene Bears, MD on 04/29/2019 at 3:05 PM EST  CT is negative for concerning findings in the abd/pelvis.  No cause for her upper abd pain.  If symptoms continue I would recommend we consider endoscopy and colonoscopy  Please schedule pt for endo/colon

## 2019-05-04 NOTE — Telephone Encounter (Signed)
Patient states that she was told she would need an endoscopy and at the time she was not sure if she wanted to get it done or not but she is calling in because she does want to proceed with it. I did not see anything in the notes about Dr. Hilarie Fredrickson wanting to proceed with an endo- Told her I would check with Dr. Hilarie Fredrickson so we could schedule procedure.

## 2019-05-04 NOTE — Telephone Encounter (Signed)
Patient is scheduled Dec. 17th at 11:00 for endo/colon.

## 2019-05-05 NOTE — Telephone Encounter (Signed)
Janet Le has scheduled this

## 2019-05-07 ENCOUNTER — Inpatient Hospital Stay (HOSPITAL_COMMUNITY): Admission: RE | Admit: 2019-05-07 | Payer: Medicare Other | Source: Ambulatory Visit

## 2019-05-11 ENCOUNTER — Ambulatory Visit: Payer: Medicare Other

## 2019-05-11 ENCOUNTER — Ambulatory Visit: Payer: Medicare Other | Admitting: Pulmonary Disease

## 2019-05-11 ENCOUNTER — Encounter: Payer: Self-pay | Admitting: Pulmonary Disease

## 2019-05-11 ENCOUNTER — Ambulatory Visit (INDEPENDENT_AMBULATORY_CARE_PROVIDER_SITE_OTHER): Payer: Medicare Other

## 2019-05-11 ENCOUNTER — Other Ambulatory Visit: Payer: Self-pay

## 2019-05-11 VITALS — BP 124/82 | HR 74 | Temp 97.4°F | Ht 62.0 in | Wt 244.0 lb

## 2019-05-11 DIAGNOSIS — G4719 Other hypersomnia: Secondary | ICD-10-CM | POA: Insufficient documentation

## 2019-05-11 DIAGNOSIS — F419 Anxiety disorder, unspecified: Secondary | ICD-10-CM | POA: Diagnosis not present

## 2019-05-11 DIAGNOSIS — R0609 Other forms of dyspnea: Secondary | ICD-10-CM

## 2019-05-11 DIAGNOSIS — R0600 Dyspnea, unspecified: Secondary | ICD-10-CM

## 2019-05-11 DIAGNOSIS — K581 Irritable bowel syndrome with constipation: Secondary | ICD-10-CM | POA: Diagnosis not present

## 2019-05-11 DIAGNOSIS — F329 Major depressive disorder, single episode, unspecified: Secondary | ICD-10-CM

## 2019-05-11 DIAGNOSIS — F32A Depression, unspecified: Secondary | ICD-10-CM

## 2019-05-11 DIAGNOSIS — I890 Lymphedema, not elsewhere classified: Secondary | ICD-10-CM

## 2019-05-11 DIAGNOSIS — R413 Other amnesia: Secondary | ICD-10-CM

## 2019-05-11 DIAGNOSIS — G4734 Idiopathic sleep related nonobstructive alveolar hypoventilation: Secondary | ICD-10-CM | POA: Insufficient documentation

## 2019-05-11 HISTORY — DX: Other hypersomnia: G47.19

## 2019-05-11 HISTORY — DX: Idiopathic sleep related nonobstructive alveolar hypoventilation: G47.34

## 2019-05-11 HISTORY — DX: Dyspnea, unspecified: R06.00

## 2019-05-11 HISTORY — DX: Other forms of dyspnea: R06.09

## 2019-05-11 LAB — PULMONARY FUNCTION TEST
DL/VA % pred: 117 %
DL/VA: 5 ml/min/mmHg/L
DLCO unc % pred: 96 %
DLCO unc: 17.98 ml/min/mmHg
FEF 25-75 Post: 2.18 L/sec
FEF 25-75 Pre: 2.07 L/sec
FEF2575-%Change-Post: 5 %
FEF2575-%Pred-Post: 119 %
FEF2575-%Pred-Pre: 113 %
FEV1-%Change-Post: 0 %
FEV1-%Pred-Post: 103 %
FEV1-%Pred-Pre: 103 %
FEV1-Post: 1.91 L
FEV1-Pre: 1.9 L
FEV1FVC-%Change-Post: 0 %
FEV1FVC-%Pred-Pre: 105 %
FEV6-%Change-Post: 0 %
FEV6-%Pred-Post: 100 %
FEV6-%Pred-Pre: 100 %
FEV6-Post: 2.27 L
FEV6-Pre: 2.27 L
FEV6FVC-%Change-Post: -1 %
FEV6FVC-%Pred-Post: 102 %
FEV6FVC-%Pred-Pre: 103 %
FVC-%Change-Post: 0 %
FVC-%Pred-Post: 97 %
FVC-%Pred-Pre: 96 %
FVC-Post: 2.29 L
FVC-Pre: 2.27 L
Post FEV1/FVC ratio: 83 %
Post FEV6/FVC ratio: 99 %
Pre FEV1/FVC ratio: 83 %
Pre FEV6/FVC Ratio: 100 %
RV % pred: 79 %
RV: 1.54 L
TLC % pred: 81 %
TLC: 3.85 L

## 2019-05-11 NOTE — Assessment & Plan Note (Signed)
Anxiety likely playing a component in patient's shortness of breath Normal pulmonary function testing today  Plan: Follow-up with Dr. Alverda Skeans as outlined by primary care Keep regular follow-up with primary care

## 2019-05-11 NOTE — Assessment & Plan Note (Signed)
Not wearing compression stockings today Reports adherence to Lasix 20 mg  Plan: Continue Lasix 20 mg Emphasized need to wear compression stockings on a daily basis Follow-up with primary care

## 2019-05-11 NOTE — Assessment & Plan Note (Signed)
Pulmonary function test reviewed today with patient  Plan: Walk today in office Chest x-ray today Patient needs to keep routine follow-up with primary care as well as gastroenterology Patient is to follow-up with primary care regarding anxiety management as I believe this likely could be a component

## 2019-05-11 NOTE — Progress Notes (Signed)
Your chest x-ray results of come back.  Showing no acute changes.  No plan of care changes at this time.  Keep follow-up appointment.    Follow-up with our office if symptoms worsen or you do not feel like you are improving under her current regimen.  It was a pleasure taking care of you,  Kahliyah Dick, FNP 

## 2019-05-11 NOTE — Progress Notes (Signed)
@Patient  ID: Janet Le, female    DOB: 1955/11/02, 63 y.o.   MRN: [id number]  Chief Complaint  Patient presents with   Follow-up    PFT f/u. States she is not able to get a full breath out or not. Still has the constant stinging pain in her stomach.     Referring provider: Binnie Rail, MD  HPI:  63 year old female never smoker initially referred to our office for dyspnea on exertion and suspected obstructive sleep apnea.  Patient sleep study in 2019 did not show obstructive sleep apnea.  PMH: Migraine, hypertension, lymphedema, IBS, GERD, renal cell cancer, obesity, gout, prediabetes, arthritis, anxiety, depression, fatigue, chronic back pain, adult ADHD Smoker/ Smoking History: Never smoker Maintenance: None Pt of: Dr. Ander Slade  05/11/2019  - Visit   63 year old female never smoker presenting to office today after completing pulmonary function test.  Pulmonary function test results are listed below:  05/11/2019-pulmonary function test-FVC 2.27 (96% predicted), postbronchodilator ratio 83, postbronchodilator FEV1 1.91 (103% predicted), no bronchodilator response, DLCO 17.98 (96% predicted)  Patient reports that since last being seen she is established with gastroenterology Dr. Hilarie Fredrickson.  She is also seeing her primary care provider.  She continues to be maintained on 20 mg of Lasix daily for management of her lymphedema.  She does continue to deal with bouts of anxiety that sometimes affect her breathing.  She is followed by primary care for this.  Patient does feel that she has had some more congestion lately.   Tests:   04/29/2018-split-night sleep study-AHI of 4.6  05/15/2017-echocardiogram-LV ejection fraction 60 to 65%, mild LVH, grade 1 diastolic dysfunction, PA pressure 27  05/11/2019-pulmonary function test-FVC 2.27 (96% predicted), postbronchodilator ratio 83, postbronchodilator FEV1 1.91 (103% predicted), no bronchodilator response, DLCO 17.98 (96%  predicted)  FENO:  No results found for: NITRICOXIDE  PFT: PFT Results Latest Ref Rng & Units 05/11/2019  FVC-Pre L 2.27  FVC-Predicted Pre % 96  FVC-Post L 2.29  FVC-Predicted Post % 97  Pre FEV1/FVC % % 83  Post FEV1/FCV % % 83  FEV1-Pre L 1.90  FEV1-Predicted Pre % 103  FEV1-Post L 1.91  DLCO UNC% % 96  DLCO COR %Predicted % 117  TLC L 3.85  TLC % Predicted % 81  RV % Predicted % 79    WALK:  No flowsheet data found.  Imaging: Ct Abdomen Pelvis W Contrast  Result Date: 04/29/2019 CLINICAL DATA:  Upper and left-sided abdominal pain intermittently since March of 2020. Prior right kidney lower pole renal cell carcinoma with prior cryoablation. EXAM: CT ABDOMEN AND PELVIS WITH CONTRAST TECHNIQUE: Multidetector CT imaging of the abdomen and pelvis was performed using the standard protocol following bolus administration of intravenous contrast. CONTRAST:  160m OMNIPAQUE IOHEXOL 300 MG/ML  SOLN COMPARISON:  08/22/2018 FINDINGS: Lower chest: Unremarkable Hepatobiliary: The previous tiny focus of suspected enhancement in the right hepatic lobe on prior arterial phase images is not readily apparent on today's portal venous phase assessment of the liver. The liver and gallbladder appear unremarkable. No biliary dilatation. Pancreas: Unremarkable Spleen: Unremarkable Adrenals/Urinary Tract: Adrenal glands normal. No changing contour or appearance of the right kidney lower pole posterior cryoablation zone. 3 mm hypodense lesion in the right kidney upper pole anteriorly on image 26/3, too small to characterize although statistically likely to be benign. Stomach/Bowel: Upper normal amount of stool in the colon. No significant diverticulosis. Normal appendix. Vascular/Lymphatic: Aortoiliac atherosclerotic vascular disease. Reproductive: Accentuated myometrial enhancement in the left uterine body suspicious for  a small fibroid. Otherwise unremarkable. Other: No supplemental non-categorized findings.  Musculoskeletal: Grade 1 degenerative anterolisthesis at L5-S1. Lumbar spondylosis and degenerative disc disease cause bilateral impingement at L4-5 and L5-S1. IMPRESSION: 1. A specific cause for the patient's left upper quadrant abdominal pain is not identified. 2. Other imaging findings of potential clinical significance: Small uterine fibroid. Lumbar spondylosis and degenerative disc disease causing impingement at L4-5 and L5-S1. Aortic Atherosclerosis (ICD10-I70.0). Electronically Signed   By: Van Clines M.D.   On: 04/29/2019 12:09    Lab Results:  CBC    Component Value Date/Time   WBC 7.5 03/10/2019 1223   RBC 3.99 03/10/2019 1223   HGB 12.4 03/10/2019 1223   HGB 11.9 02/28/2017 1035   HCT 37.9 03/10/2019 1223   HCT 37.0 02/28/2017 1035   PLT 141.0 (L) 03/10/2019 1223   PLT 114 (L) 02/28/2017 1035   MCV 94.9 03/10/2019 1223   MCV 94.9 02/28/2017 1035   MCH 30.5 02/28/2017 1035   MCH 31.1 10/18/2016 1203   MCHC 32.9 03/10/2019 1223   RDW 15.4 03/10/2019 1223   RDW 15.5 (H) 02/28/2017 1035   LYMPHSABS 2.0 03/10/2019 1223   LYMPHSABS 1.8 02/28/2017 1035   MONOABS 0.5 03/10/2019 1223   MONOABS 0.5 02/28/2017 1035   EOSABS 0.1 03/10/2019 1223   EOSABS 0.1 02/28/2017 1035   BASOSABS 0.0 03/10/2019 1223   BASOSABS 0.0 02/28/2017 1035    BMET    Component Value Date/Time   NA 138 03/10/2019 1223   NA 142 03/05/2018 1040   NA 141 02/28/2017 1035   K 4.3 03/10/2019 1223   K 4.0 02/28/2017 1035   CL 103 03/10/2019 1223   CO2 30 03/10/2019 1223   CO2 28 02/28/2017 1035   GLUCOSE 86 03/10/2019 1223   GLUCOSE 80 02/28/2017 1035   BUN 11 04/21/2019 1656   BUN 11 03/05/2018 1040   BUN 9.0 02/28/2017 1035   CREATININE 0.92 04/21/2019 1656   CREATININE 0.9 02/28/2017 1035   CALCIUM 10.3 03/10/2019 1223   CALCIUM 10.1 02/28/2017 1035   GFRNONAA 78 03/05/2018 1040   GFRNONAA 71 05/25/2015 1508   GFRAA 90 03/05/2018 1040   GFRAA 82 05/25/2015 1508    BNP No  results found for: BNP  ProBNP No results found for: PROBNP  Specialty Problems      Pulmonary Problems   SINUSITIS, CHRONIC    Qualifier: Diagnosis of  By: Asa Lente MD, Jannifer Rodney       ALLERGIC RHINITIS    Qualifier: Diagnosis of  By: Asa Lente MD, Mateo Flow A       Cough    Followed in Pulmonary clinic/ [facility]/ [facility]  [facility] d/c 07/01/2012  Med calendar 07/30/2012       Dyspnea on exertion   Nocturnal hypoxemia      Allergies  Allergen Reactions   Sertraline Hcl Other (See Comments)    Spasms; numbness   Ace Inhibitors Cough   Codeine Palpitations   Darifenacin Hydrobromide Er Other (See Comments)    Pt states made her feel queezy & drunk   Gabapentin Other (See Comments)    Hallucinations   Pravastatin Other (See Comments)    myalgias   Propoxyphene Hcl Palpitations   Wellbutrin [Bupropion Hcl] Other (See Comments)    Numbness of mouth/lips    Immunization History  Administered Date(s) Administered   Fluad Quad(high Dose 65+) 03/10/2019   Influenza Split 04/09/2012   Influenza Whole 03/25/2009, 03/15/2010   Influenza,inj,Quad PF,6+ Mos 07/08/2013, 07/19/2015,  05/30/2016, 04/05/2017, 03/27/2018   Pneumococcal Conjugate-13 07/01/2014   Tdap 01/07/2013   Zoster Recombinat (Shingrix) 06/06/2017, 10/04/2017    Past Medical History:  Diagnosis Date   Allergic rhinitis, cause unspecified    Anxiety    Blood dyscrasia    platelets low in past   Chronic back pain    Chronic constipation    Chronic sinusitis    Depression    Endometriosis    ENDOMETRIOSIS 05/06/2009   Qualifier: Diagnosis of  By: Surface RN, Butch Penny     Esophageal stricture    GERD (gastroesophageal reflux disease)    Hemorrhoids    Hiatal hernia    Hyperlipidemia    Hypertension    Irritable bowel syndrome    Lymphedema    Osteoarthritis    Personal history of diabetes mellitus    controlled with diet only   Renal cell cancer ([facility]) 11/2010  dx   R, s/p cryoablation 02/16/11   Syncopal episodes    since childhood    Tobacco History: Social History   Tobacco Use  Smoking Status Never Smoker  Smokeless Tobacco Never Used   Counseling given: Yes   Continue to not smoke  Outpatient Encounter Medications as of 05/11/2019  Medication Sig   AMBULATORY NON FORMULARY MEDICATION 1 Units by Other route daily. Diabetic Shoes   amLODipine (NORVASC) 5 MG tablet TAKE 1 TABLET DAILY   atorvastatin (LIPITOR) 10 MG tablet TAKE 1 TABLET DAILY   atropine 1 % ophthalmic solution    blood glucose meter kit and supplies KIT Dispense based on patient and insurance preference. Use up to four times daily as directed. (FOR R73.03).   calcium-vitamin D (OSCAL WITH D) 500-200 MG-UNIT tablet Take 1 tablet by mouth daily.    cromolyn (OPTICROM) 4 % ophthalmic solution as needed.    Diclofenac Sodium 2 % SOLN Apply 1 pump twice daily. (Patient taking differently: Apply 1 application topically 2 (two) times daily as needed (PAIN). Apply 1 pump twice daily.)   dicyclomine (BENTYL) 10 MG capsule Take 1-2 capsules (10-20 mg total) by mouth 3 (three) times daily as needed for spasms.   Elastic Bandages & Supports (MEDICAL COMPRESSION SOCKS) MISC Use as directed daily for lymphedema   fluticasone (FLONASE) 50 MCG/ACT nasal spray Place 1 spray into both nostrils daily. (Patient taking differently: Place 1 spray into both nostrils as needed. )   furosemide (LASIX) 20 MG tablet TAKE 1 TABLET DAILY   hydrocortisone (ANUSOL-HC) 2.5 % rectal cream Place 1 application rectally 2 (two) times daily. (Patient taking differently: Place 1 application rectally 2 (two) times daily as needed. )   lidocaine (LIDODERM) 5 % Place 1 patch onto the skin daily. Remove & Discard patch within 12 hours or as directed by MD   linaclotide (LINZESS) 145 MCG CAPS capsule Take 1 capsule (145 mcg total) by mouth daily before breakfast.   methocarbamol (ROBAXIN) 500 MG  tablet Take 1 tablet (500 mg total) by mouth every 6 (six) hours as needed for muscle spasms.   Multiple Vitamin (MULTIVITAMIN) tablet Take 1 tablet by mouth daily.   ondansetron (ZOFRAN) 4 MG tablet Take 4 mg by mouth 2 (two) times daily. As needed   polyethylene glycol powder (GLYCOLAX/MIRALAX) 17 GM/SCOOP powder Dissolve 17 grams (1 capful) into 8 oz water/juice and drink daily as needed for constipation   Prenatal Vit-Fe Fumarate-FA (PNV PRENATAL PLUS MULTIVITAMIN) 27-1 MG TABS TAKE 1 TABLET DAILY AT 12 NOON   propranolol (INDERAL) 80 MG tablet TAKE  1 TABLET TWICE A DAY   telmisartan (MICARDIS) 80 MG tablet Take 1 tablet (80 mg total) by mouth daily.   tolterodine (DETROL LA) 4 MG 24 hr capsule Take 1 capsule by mouth daily.   venlafaxine XR (EFFEXOR-XR) 75 MG 24 hr capsule Take 3 capsules (225 mg total) by mouth daily with breakfast.   zolpidem (AMBIEN) 10 MG tablet Take 1 tablet (10 mg total) by mouth at bedtime as needed for sleep.   methylphenidate (RITALIN) 20 MG tablet Take 1.5 tablets (30 mg total) by mouth 2 (two) times daily with breakfast and lunch.   No facility-administered encounter medications on file as of 05/11/2019.      Review of Systems  Review of Systems  Constitutional: Negative for activity change, fatigue and fever.  HENT: Positive for congestion. Negative for sinus pressure, sinus pain and sore throat.   Respiratory: Positive for shortness of breath. Negative for cough and wheezing.   Cardiovascular: Positive for leg swelling. Negative for chest pain and palpitations.  Gastrointestinal: Negative for diarrhea, nausea and vomiting.       Working with gastroenterology, endoscopy and colonoscopy scheduled December  Musculoskeletal: Negative for arthralgias.  Neurological: Negative for dizziness.  Psychiatric/Behavioral: Positive for confusion. Negative for sleep disturbance. The patient is nervous/anxious.      Physical Exam  BP 124/82    Pulse 74     Temp (!) 97.4 F (36.3 C) (Temporal)    Ht 5' 2"  (1.575 m)    Wt 244 lb (110.7 kg)    SpO2 97%    BMI 44.63 kg/m   Wt Readings from Last 5 Encounters:  05/11/19 244 lb (110.7 kg)  04/21/19 240 lb (108.9 kg)  03/17/19 245 lb 6.4 oz (111.3 kg)  03/10/19 251 lb 12.8 oz (114.2 kg)  02/09/19 248 lb 6 oz (112.7 kg)    BMI Readings from Last 5 Encounters:  05/11/19 44.63 kg/m  04/21/19 43.90 kg/m  03/17/19 44.88 kg/m  03/10/19 46.05 kg/m  02/09/19 45.43 kg/m     Physical Exam Vitals signs and nursing note reviewed.  Constitutional:      General: She is not in acute distress.    Appearance: Normal appearance. She is obese.     Comments: Chronically ill adult female  HENT:     Head: Normocephalic and atraumatic.     Right Ear: Tympanic membrane, ear canal and external ear normal. There is no impacted cerumen.     Left Ear: Tympanic membrane, ear canal and external ear normal. There is no impacted cerumen.     Nose: Nose normal. No congestion.     Mouth/Throat:     Mouth: Mucous membranes are moist.     Pharynx: Oropharynx is clear.  Eyes:     Pupils: Pupils are equal, round, and reactive to light.  Neck:     Musculoskeletal: Normal range of motion.  Cardiovascular:     Rate and Rhythm: Normal rate and regular rhythm.     Pulses: Normal pulses.     Heart sounds: Normal heart sounds. No murmur.  Pulmonary:     Effort: Pulmonary effort is normal. No respiratory distress.     Breath sounds: Normal breath sounds. No decreased air movement. No decreased breath sounds, wheezing or rales.  Musculoskeletal:        General: Swelling (Upper and lower extremity swelling, patient reports this is her baseline) present.     Right lower leg: 3+ Pitting Edema present.     Left lower  leg: 3+ Pitting Edema present.  Skin:    General: Skin is warm and dry.     Capillary Refill: Capillary refill takes less than 2 seconds.  Neurological:     General: No focal deficit present.     Mental  Status: She is alert and oriented to person, place, and time. Mental status is at baseline.     Gait: Gait normal.  Psychiatric:        Mood and Affect: Mood normal. Affect is flat.        Speech: Speech normal.        Behavior: Behavior is slowed.        Thought Content: Thought content normal.        Judgment: Judgment normal.       Assessment & Plan:   Dyspnea on exertion Pulmonary function test reviewed today with patient  Plan: Walk today in office Chest x-ray today Patient needs to keep routine follow-up with primary care as well as gastroenterology Patient is to follow-up with primary care regarding anxiety management as I believe this likely could be a component    IBS (irritable bowel syndrome) with chronic constipation Plan: Continue follow-up with gastroenterology  Anxiety and depression Anxiety likely playing a component in patient's shortness of breath Normal pulmonary function testing today  Plan: Follow-up with Dr. Alverda Skeans as outlined by primary care Keep regular follow-up with primary care  Lymphedema Not wearing compression stockings today Reports adherence to Lasix 20 mg  Plan: Continue Lasix 20 mg Emphasized need to wear compression stockings on a daily basis Follow-up with primary care  Memory difficulties This was previously mentioned in a 2019 office note by primary care.  Looks like they referred to neurology Dr. Tomi Likens  Plan: Continue to work with neurology and primary care    Return in about 6 months (around 11/08/2019), or if symptoms worsen or fail to improve, for Follow up with Dr. Ander Slade.   Lauraine Rinne, NP 05/11/2019   This appointment was 28 minutes long with over 50% of the time in direct face-to-face patient care, assessment, plan of care, and follow-up.

## 2019-05-11 NOTE — Patient Instructions (Addendum)
You were seen today by Lauraine Rinne, NP  for:   1. Dyspnea on exertion  - DG Chest 2 View; Future  Pulmonary function testing today is normal  Walk today in office  2. Excessive daytime sleepiness  Continue medications as prescribed Keep follow-up with our office in 6 to 9 months with Dr. Ander Slade  3. Anxiety and depression  Follow-up with primary care if you notice that your anxiety is worsening  4. IBS, GI discomfort   Continue to work with gastroenterology   We recommend today:  Orders Placed This Encounter  Procedures  . DG Chest 2 View    Standing Status:   Future    Number of Occurrences:   1    Standing Expiration Date:   07/10/2020    Order Specific Question:   Reason for Exam (SYMPTOM  OR DIAGNOSIS REQUIRED)    Answer:   shortness of breath    Order Specific Question:   Preferred imaging location?    Answer:   Internal    Order Specific Question:   Radiology Contrast Protocol - do NOT remove file path    Answer:   [email or web address]   Orders Placed This Encounter  Procedures  . DG Chest 2 View   No orders of the defined types were placed in this encounter.   Follow Up:    Return in about 6 months (around 11/08/2019), or if symptoms worsen or fail to improve, for Follow up with Dr. Ander Slade.   Please do your part to reduce the spread of COVID-19:      Reduce your risk of any infection  and COVID19 by using the similar precautions used for avoiding the common cold or flu:  [name] Wash your hands often with soap and warm water for at least 20 seconds.  If soap and water are not readily available, use an alcohol-based hand sanitizer with at least 60% alcohol.  . If coughing or sneezing, cover your mouth and nose by coughing or sneezing into the elbow areas of your shirt or coat, into a tissue or into your sleeve (not your hands). Langley Gauss A MASK when in public  . Avoid shaking hands with others and consider head nods or  verbal greetings only. . Avoid touching your eyes, nose, or mouth with unwashed hands.  . Avoid close contact with people who are sick. . Avoid places or events with large numbers of people in one location, like concerts or sporting events. . If you have some symptoms but not all symptoms, continue to monitor at home and seek medical attention if your symptoms worsen. . If you are having a medical emergency, call 911.   [place] / e-Visit: [email or web address]         [facility]: [facility]: [id number]                   [facility]: [id number]     It is flu season:   >>> Best ways to protect herself from the flu: Receive the yearly flu vaccine, practice good hand hygiene washing with soap and also using hand sanitizer when available, eat a nutritious meals, get adequate rest, hydrate appropriately   Please contact the office if your symptoms worsen or you have concerns that you are not improving.   Thank you for choosing [facility] for your healthcare, and for allowing [place] to partner with you  on your healthcare journey. I am thankful to be able to provide care to you today.   Wyn Quaker FNP-C

## 2019-05-11 NOTE — Assessment & Plan Note (Signed)
Plan: Continue follow-up with gastroenterology  

## 2019-05-11 NOTE — Assessment & Plan Note (Signed)
This was previously mentioned in a 2019 office note by primary care.  Looks like they referred to neurology Dr. Tomi Likens  Plan: Continue to work with neurology and primary care

## 2019-05-14 ENCOUNTER — Encounter: Payer: Medicare Other | Admitting: Internal Medicine

## 2019-05-25 ENCOUNTER — Encounter: Payer: Self-pay | Admitting: Internal Medicine

## 2019-05-28 ENCOUNTER — Encounter: Payer: Self-pay | Admitting: Internal Medicine

## 2019-05-28 ENCOUNTER — Other Ambulatory Visit: Payer: Self-pay

## 2019-05-28 ENCOUNTER — Ambulatory Visit (AMBULATORY_SURGERY_CENTER): Payer: Self-pay

## 2019-05-28 VITALS — Temp 97.3°F | Ht 62.0 in | Wt 246.6 lb

## 2019-05-28 DIAGNOSIS — K5900 Constipation, unspecified: Secondary | ICD-10-CM

## 2019-05-28 DIAGNOSIS — R1010 Upper abdominal pain, unspecified: Secondary | ICD-10-CM

## 2019-05-28 MED ORDER — NA SULFATE-K SULFATE-MG SULF 17.5-3.13-1.6 GM/177ML PO SOLN: 1.0000 | Freq: Once | ORAL | 0 refills | Status: AC

## 2019-05-28 NOTE — Progress Notes (Signed)
Denies allergies to eggs or soy products. Denies complication of anesthesia or sedation. Denies use of weight loss medication. Denies use of O2.   Emmi instructions given for colonoscopy.  Covid screening is scheduled for  06/08/19 @ 11:10 Am. Pre-Visit took an hour reviewing instructions. I am not sure she totally comprehended her two day prep instructions. An extra copy of the instructions were given to the patient at her request so that she could give them to a person that could help her. Patient was encouraged to call if she had any questions regarding her instructions.

## 2019-06-02 ENCOUNTER — Telehealth: Payer: Self-pay | Admitting: Internal Medicine

## 2019-06-02 NOTE — Telephone Encounter (Signed)
Discussed with pt that miralax is the same thing as polyethelene glycol and that she did need to take the dulcolax. Let her know she does not need to take the linzess the day of her procedure.

## 2019-06-08 ENCOUNTER — Other Ambulatory Visit: Payer: Self-pay | Admitting: Internal Medicine

## 2019-06-08 ENCOUNTER — Ambulatory Visit (INDEPENDENT_AMBULATORY_CARE_PROVIDER_SITE_OTHER): Payer: Medicare Other

## 2019-06-08 DIAGNOSIS — Z1159 Encounter for screening for other viral diseases: Secondary | ICD-10-CM

## 2019-06-09 LAB — SARS CORONAVIRUS 2 (TAT 6-24 HRS): SARS Coronavirus 2: NEGATIVE

## 2019-06-11 ENCOUNTER — Ambulatory Visit (AMBULATORY_SURGERY_CENTER): Payer: Medicare Other | Admitting: Internal Medicine

## 2019-06-11 ENCOUNTER — Encounter: Payer: Self-pay | Admitting: Internal Medicine

## 2019-06-11 ENCOUNTER — Other Ambulatory Visit: Payer: Self-pay

## 2019-06-11 VITALS — BP 131/79 | HR 69 | Temp 97.9°F | Resp 13 | Ht 62.0 in | Wt 244.0 lb

## 2019-06-11 DIAGNOSIS — R1010 Upper abdominal pain, unspecified: Secondary | ICD-10-CM

## 2019-06-11 DIAGNOSIS — K2970 Gastritis, unspecified, without bleeding: Secondary | ICD-10-CM

## 2019-06-11 DIAGNOSIS — D123 Benign neoplasm of transverse colon: Secondary | ICD-10-CM

## 2019-06-11 DIAGNOSIS — K6389 Other specified diseases of intestine: Secondary | ICD-10-CM | POA: Diagnosis not present

## 2019-06-11 DIAGNOSIS — D124 Benign neoplasm of descending colon: Secondary | ICD-10-CM | POA: Diagnosis not present

## 2019-06-11 DIAGNOSIS — K5909 Other constipation: Secondary | ICD-10-CM

## 2019-06-11 DIAGNOSIS — K2950 Unspecified chronic gastritis without bleeding: Secondary | ICD-10-CM

## 2019-06-11 MED ORDER — SODIUM CHLORIDE 0.9 % IV SOLN: 500.0000 mL | Freq: Once | INTRAVENOUS | Status: DC

## 2019-06-11 MED ORDER — PANTOPRAZOLE SODIUM 40 MG PO TBEC: 40.0000 mg | DELAYED_RELEASE_TABLET | Freq: Every day | ORAL | 2 refills | Status: DC

## 2019-06-11 NOTE — Progress Notes (Signed)
Pt's states no medical or surgical changes since previsit or office visit.  Temp taken by JB VS taken by CW 

## 2019-06-11 NOTE — Op Note (Signed)
[facility] Patient Name: Janet Le Procedure Date: 06/11/2019 11:35 AM MRN: [id number] Endoscopist: Jerene Bears , MD Age: 63 Referring MD:  Date of Birth: 10-21-1955 Gender: Female Account #: [id number] Procedure:                Upper GI endoscopy Indications:              Abdominal pain in the left upper quadrant Medicines:                Monitored Anesthesia Care Procedure:                Pre-Anesthesia Assessment:                           - Prior to the procedure, a History and Physical                            was performed, and patient medications and                            allergies were reviewed. The patient's tolerance of                            previous anesthesia was also reviewed. The risks                            and benefits of the procedure and the sedation                            options and risks were discussed with the patient.                            All questions were answered, and informed consent                            was obtained. Prior Anticoagulants: The patient has                            taken no previous anticoagulant or antiplatelet                            agents. ASA Grade Assessment: III - A patient with                            severe systemic disease. After reviewing the risks                            and benefits, the patient was deemed in                            satisfactory condition to undergo the procedure.                           After obtaining informed consent, the endoscope was  passed under direct vision. Throughout the                            procedure, the patient's blood pressure, pulse, and                            oxygen saturations were monitored continuously. The                            Endoscope was introduced through the mouth, and                            advanced to the second part of duodenum. The upper                            GI  endoscopy was accomplished without difficulty.                            The patient tolerated the procedure well. Scope In: Scope Out: Findings:                 The examined esophagus was normal.                           Diffuse moderate inflammation characterized by                            erythema was found in the gastric body and in the                            gastric antrum. Mucosa is atropic appearing.                            Biopsies were taken with a cold forceps for                            histology (gastric body, antrum, incisura).                            Biopsies were taken with a cold forceps for                            histology and Helicobacter pylori testing.                           The examined duodenum was normal. Complications:            No immediate complications. Estimated Blood Loss:     Estimated blood loss was minimal. Impression:               - Normal esophagus.                           - Moderate gastritis. Biopsied.                           -  Normal examined duodenum. Recommendation:           - Patient has a contact number available for                            emergencies. The signs and symptoms of potential                            delayed complications were discussed with the                            patient. Return to normal activities tomorrow.                            Written discharge instructions were provided to the                            patient.                           - Resume previous diet.                           - Continue present medications.                           - Begin pantoprazole 40 mg once daily (30 minutes                            before 1st meal) for gastritis.                           - Await pathology results. Jerene Bears, MD 06/11/2019 12:10:23 PM This report has been signed electronically.

## 2019-06-11 NOTE — Progress Notes (Signed)
Called to room to assist during endoscopic procedure.  Patient ID and intended procedure confirmed with present staff. Received instructions for my participation in the procedure from the performing physician.  

## 2019-06-11 NOTE — Op Note (Signed)
[facility] Patient Name: Janet Le Procedure Date: 06/11/2019 11:39 AM MRN: [id number] Endoscopist: Jerene Bears , MD Age: 63 Referring MD:  Date of Birth: Nov 15, 1955 Gender: Female Account #: [id number] Procedure:                Colonoscopy Indications:              Abdominal pain in the left upper quadrant, Upper                            abdominal pain Medicines:                Monitored Anesthesia Care Procedure:                Pre-Anesthesia Assessment:                           - Prior to the procedure, a History and Physical                            was performed, and patient medications and                            allergies were reviewed. The patient's tolerance of                            previous anesthesia was also reviewed. The risks                            and benefits of the procedure and the sedation                            options and risks were discussed with the patient.                            All questions were answered, and informed consent                            was obtained. Prior Anticoagulants: The patient has                            taken no previous anticoagulant or antiplatelet                            agents. ASA Grade Assessment: III - A patient with                            severe systemic disease. After reviewing the risks                            and benefits, the patient was deemed in                            satisfactory condition to undergo the procedure.                           -  Prior to the procedure, a History and Physical                            was performed, and patient medications and                            allergies were reviewed. The patient's tolerance of                            previous anesthesia was also reviewed. The risks                            and benefits of the procedure and the sedation                            options and risks were discussed with the patient.                            All questions were answered, and informed consent                            was obtained. Prior Anticoagulants: The patient has                            taken no previous anticoagulant or antiplatelet                            agents. ASA Grade Assessment: III - A patient with                            severe systemic disease. After reviewing the risks                            and benefits, the patient was deemed in                            satisfactory condition to undergo the procedure.                           After obtaining informed consent, the colonoscope                            was passed under direct vision. Throughout the                            procedure, the patient's blood pressure, pulse, and                            oxygen saturations were monitored continuously. The                            Colonoscope was introduced through the anus and  advanced to the cecum, identified by appendiceal                            orifice and ileocecal valve. The colonoscopy was                            performed without difficulty. The patient tolerated                            the procedure well. The quality of the bowel                            preparation was good. The ileocecal valve,                            appendiceal orifice, and rectum were photographed. Scope In: 11:51:32 AM Scope Out: 12:05:09 PM Scope Withdrawal Time: 0 hours 9 minutes 31 seconds  Total Procedure Duration: 0 hours 13 minutes 37 seconds  Findings:                 The digital rectal exam was normal.                           A 3 mm polyp was found in the transverse colon. The                            polyp was sessile. The polyp was removed with a                            cold snare. Resection and retrieval were complete.                           Two sessile polyps were found in the descending                            colon. The  polyps were 3 to 4 mm in size. These                            polyps were removed with a cold snare. Resection                            and retrieval were complete.                           A diffuse area of moderate melanosis was found in                            the entire colon.                           2 post hemorrhoidal banding scars were found in the                            rectum.  No additional abnormalities were found on                            retroflexion. Complications:            No immediate complications. Estimated Blood Loss:     Estimated blood loss was minimal. Impression:               - One 3 mm polyp in the transverse colon, removed                            with a cold snare. Resected and retrieved.                           - Two 3 to 4 mm polyps in the descending colon,                            removed with a cold snare. Resected and retrieved.                           - Melanosis in the colon.                           - Scars (as expected) in the rectum from previous                            hemorrhoidal banding. Recommendation:           - Patient has a contact number available for                            emergencies. The signs and symptoms of potential                            delayed complications were discussed with the                            patient. Return to normal activities tomorrow.                            Written discharge instructions were provided to the                            patient.                           - Resume previous diet.                           - Continue present medications.                           - Await pathology results.                           - Repeat colonoscopy is recommended for  surveillance. The colonoscopy date will be                            determined after pathology results from today's                            exam  become available for review. Jerene Bears, MD 06/11/2019 12:13:53 PM This report has been signed electronically.

## 2019-06-11 NOTE — Patient Instructions (Signed)
HANDOUTS PROVIDED ON: GASTRITIS & POLYPS   THE POLYPS REMOVED AND BIOPSIES TAKEN TODAY HAVE BEEN SENT FOR PATHOLOGY.  THE RESULTS CAN TAKE 2-3 WEEKS TO RECEIVE.  BASED ON THE RESULTS IS WHEN YOUR NEXT COLONOSCOPY WILL BE RECOMMENDED.  YOU MAY RESUME YOUR PREVIOUS DIET AND MEDICATION SCHEDULE.  START TAKING PANTOPRAZOLE 40mg  DAILY BEFORE BREAKFAST.  [facility] YOU FOR ALLOWING [place] TO CARE FOR YOU TODAY!!!  YOU HAD AN ENDOSCOPIC PROCEDURE TODAY AT [facility]:   Refer to the procedure report that was given to you for any specific questions about what was found during the examination.  If the procedure report does not answer your questions, please call your gastroenterologist to clarify.  If you requested that your care partner not be given the details of your procedure findings, then the procedure report has been included in a sealed envelope for you to review at your convenience later.  YOU SHOULD EXPECT: Some feelings of bloating in the abdomen. Passage of more gas than usual.  Walking can help get rid of the air that was put into your GI tract during the procedure and reduce the bloating. If you had a lower endoscopy (such as a colonoscopy or flexible sigmoidoscopy) you may notice spotting of blood in your stool or on the toilet paper. If you underwent a bowel prep for your procedure, you may not have a normal bowel movement for a few days.  Please Note:  You might notice some irritation and congestion in your nose or some drainage.  This is from the oxygen used during your procedure.  There is no need for concern and it should clear up in a day or so.  SYMPTOMS TO REPORT IMMEDIATELY:   Following lower endoscopy (colonoscopy or flexible sigmoidoscopy):  Excessive amounts of blood in the stool  Significant tenderness or worsening of abdominal pains  Swelling of the abdomen that is new, acute  Fever of 100F or higher   Following upper endoscopy (EGD)  Vomiting of blood or  coffee ground material  New chest pain or pain under the shoulder blades  Painful or persistently difficult swallowing  New shortness of breath  Fever of 100F or higher  Black, tarry-looking stools  For urgent or emergent issues, a gastroenterologist can be reached at any hour by calling [phone].   DIET:  We do recommend a small meal at first, but then you may proceed to your regular diet.  Drink plenty of fluids but you should avoid alcoholic beverages for 24 hours.  ACTIVITY:  You should plan to take it easy for the rest of today and you should NOT DRIVE or use heavy machinery until tomorrow (because of the sedation medicines used during the test).    FOLLOW UP: Our staff will call the number listed on your records 48-72 hours following your procedure to check on you and address any questions or concerns that you may have regarding the information given to you following your procedure. If we do not reach you, we will leave a message.  We will attempt to reach you two times.  During this call, we will ask if you have developed any symptoms of COVID 19. If you develop any symptoms (ie: fever, flu-like symptoms, shortness of breath, cough etc.) before then, please call [phone].  If you test positive for Covid 19 in the 2 weeks post procedure, please call and report this information to [place].    If any biopsies were taken you will be  contacted by phone or by letter within the next 1-3 weeks.  Please call us at [phone] if you have not heard about the biopsies in 3 weeks.    SIGNATURES/CONFIDENTIALITY: You and/or your care partner have signed paperwork which will be entered into your electronic medical record.  These signatures attest to the fact that that the information above on your After Visit Summary has been reviewed and is understood.  Full responsibility of the confidentiality of this discharge information lies with you and/or your care-partner.

## 2019-06-15 ENCOUNTER — Telehealth: Payer: Self-pay

## 2019-06-15 NOTE — Telephone Encounter (Signed)
  Follow up Call-  Call back number 06/11/2019  Post procedure Call Back phone  # [phone]  Permission to leave phone message Yes  Some recent data might be hidden     Patient questions:  Do you have a fever, pain , or abdominal swelling? No. Pain Score  0 *  Have you tolerated food without any problems? Yes.    Have you been able to return to your normal activities? Yes.    Do you have any questions about your discharge instructions: Diet   No. Medications  Yes.   Follow up visit  Yes.    Do you have questions or concerns about your Care? No.  Actions: * If pain score is 4 or above: No action needed, pain <4.  Pt. Was concerned about the results of her procedures and stated that she "didn't really understand the paperwork".  Explained the findings on the EGD and COLON reports and why she is on the pantoprazole.  She stated that she understood it better now.  Encouraged to call Dr. Vena Rua office with any other questions.  1. Have you developed a fever since your procedure? no  2.   Have you had an respiratory symptoms (SOB or cough) since your procedure? no  3.   Have you tested positive for COVID 19 since your procedure no  4.   Have you had any family members/close contacts diagnosed with the COVID 19 since your procedure?  no   If yes to any of these questions please route to Joylene John, RN and Alphonsa Gin, Therapist, sports.

## 2019-06-15 NOTE — Telephone Encounter (Signed)
Called [phone] and left a message we tried to reach pt for a follow up call. maw

## 2019-06-17 ENCOUNTER — Encounter: Payer: Self-pay | Admitting: Internal Medicine

## 2019-06-24 ENCOUNTER — Telehealth: Payer: Self-pay | Admitting: Internal Medicine

## 2019-06-24 MED ORDER — PROPRANOLOL HCL 80 MG PO TABS: 80.0000 mg | ORAL_TABLET | Freq: Two times a day (BID) | ORAL | 1 refills | Status: DC

## 2019-06-24 MED ORDER — FUROSEMIDE 20 MG PO TABS: 20.0000 mg | ORAL_TABLET | Freq: Every day | ORAL | 1 refills | Status: DC

## 2019-06-24 MED ORDER — AMLODIPINE BESYLATE 5 MG PO TABS: 5.0000 mg | ORAL_TABLET | Freq: Every day | ORAL | 1 refills | Status: DC

## 2019-06-24 MED ORDER — ATORVASTATIN CALCIUM 10 MG PO TABS: 10.0000 mg | ORAL_TABLET | Freq: Every day | ORAL | 1 refills | Status: DC

## 2019-06-24 MED ORDER — TELMISARTAN 80 MG PO TABS: 80.0000 mg | ORAL_TABLET | Freq: Every day | ORAL | 1 refills | Status: DC

## 2019-06-24 NOTE — Telephone Encounter (Signed)
Sent all maintenance meds that's rx by Dr. Quay Burow to [facility].Updated pharmacy....[facility]

## 2019-06-24 NOTE — Telephone Encounter (Signed)
Patient requesting all medications on file be transferred to the [facility] on file. Patient unsure of which medications she is needing right now but would like to make a note that she will no [facility] using [facility].

## 2019-06-24 NOTE — Telephone Encounter (Signed)
Pt stated that she will be changing pharmacy from [facility] to [facility] on [address].

## 2019-06-24 NOTE — Telephone Encounter (Signed)
This has already been reflected in our system. Appreciate patient letting us know!

## 2019-09-14 NOTE — Patient Instructions (Signed)
  Blood work was ordered.     Medications reviewed and updated.  Changes include :     Your prescription(s) have been submitted to your pharmacy. Please take as directed and contact our office if you believe you are having problem(s) with the medication(s).  A referral was ordered for    Please followup in 6 months   

## 2019-09-14 NOTE — Progress Notes (Signed)
Subjective:    Patient ID: Janet Le, female    DOB: 05-14-1956, 64 y.o.   MRN: [id number]  HPI The patient is here for follow up of their chronic medical problems, including prediabetes, hypertension, hyperlipidemia,  chronic back pain, OA in knee, hips and shoulders  She is taking all of her medications as prescribed.   She is exercising regularly.     She sees Dr Montel Culver for her depression, anxiety.     Medications and allergies reviewed with patient and updated if appropriate.  Patient Active Problem List   Diagnosis Date Noted  . Nocturnal hypoxemia 05/11/2019  . Dyspnea on exertion 05/11/2019  . Excessive daytime sleepiness 05/11/2019  . Primary osteoarthritis involving multiple joints 03/10/2019  . Adult ADHD 01/08/2019  . Dysthymic disorder 08/12/2018  . Chronic post-traumatic stress disorder (PTSD) 08/12/2018  . Bilateral leg edema 06/12/2018  . Fatigue 11/14/2017  . Chronic back pain 11/14/2017  . Arthralgia of hip 11/14/2017  . Memory difficulties 10/08/2017  . Anxiety and depression 10/08/2017  . Neuropathy 10/07/2017  . LVH (left ventricular hypertrophy), mild 05/18/2017  . Arthritis of knee 10/30/2016  . S/P knee surgery 10/30/2016  . Burning pain 10/15/2016  . Prediabetes 05/30/2016  . Cervicalgia 05/30/2016  . Osteopenia 11/18/2015  . Thrombocytopenia ([place]) 05/30/2015  . Arthritis of left lower extremity 02/02/2015  . Gout 12/11/2013  . Obesity with serious comorbidity 11/06/2013  . Cough 07/15/2012  . Left lumbar radiculopathy 04/13/2012  . Renal cell cancer ([place])   . Hemorrhoids 11/14/2010  . IBS (irritable bowel syndrome) with chronic constipation 11/14/2010  . GERD (gastroesophageal reflux disease) 11/14/2010  . HYPERCHOLESTEROLEMIA, MILD 03/16/2010  . Lymphedema 03/15/2010  . ALLERGIC RHINITIS 08/31/2009  . MIGRAINE HEADACHE 06/14/2009  . SINUSITIS, CHRONIC 06/14/2009  . SYNCOPE 05/16/2009  . Essential hypertension 05/06/2009      Current Outpatient Medications on File Prior to Visit  Medication Sig Dispense Refill  . AMBULATORY NON FORMULARY MEDICATION 1 Units by Other route daily. Diabetic Shoes 1 Units 0  . amLODipine (NORVASC) 5 MG tablet Take 1 tablet (5 mg total) by mouth daily. 90 tablet 1  . atorvastatin (LIPITOR) 10 MG tablet Take 1 tablet (10 mg total) by mouth daily. 90 tablet 1  . atropine 1 % ophthalmic solution     . blood glucose meter kit and supplies KIT Dispense based on patient and insurance preference. Use up to four times daily as directed. (FOR R73.03). 1 each 0  . calcium-vitamin D (OSCAL WITH D) 500-200 MG-UNIT tablet Take 1 tablet by mouth daily.     . cromolyn (OPTICROM) 4 % ophthalmic solution as needed.     . Diclofenac Sodium 2 % SOLN Apply 1 pump twice daily. (Patient taking differently: Apply 1 application topically 2 (two) times daily as needed (PAIN). Apply 1 pump twice daily.) 112 g 1  . dicyclomine (BENTYL) 10 MG capsule Take 1-2 capsules (10-20 mg total) by mouth 3 (three) times daily as needed for spasms. 150 capsule 2  . Elastic Bandages & Supports (MEDICAL COMPRESSION SOCKS) MISC Use as directed daily for lymphedema 4 each 0  . fluticasone (FLONASE) 50 MCG/ACT nasal spray Place 1 spray into both nostrils daily. (Patient taking differently: Place 1 spray into both nostrils as needed. ) 16 g 2  . furosemide (LASIX) 20 MG tablet Take 1 tablet (20 mg total) by mouth daily. 90 tablet 1  . hydrocortisone (ANUSOL-HC) 2.5 % rectal cream Place 1 application rectally 2 (  two) times daily. (Patient taking differently: Place 1 application rectally 2 (two) times daily as needed. ) 30 g 1  . lidocaine (LIDODERM) 5 % Place 1 patch onto the skin daily. Remove & Discard patch within 12 hours or as directed by MD 90 patch 1  . linaclotide (LINZESS) 145 MCG CAPS capsule Take 1 capsule (145 mcg total) by mouth daily before breakfast. 90 capsule 1  . methylphenidate (RITALIN) 20 MG tablet Take 1.5  tablets (30 mg total) by mouth 2 (two) times daily with breakfast and lunch. 90 tablet 0  . Multiple Vitamin (MULTIVITAMIN) tablet Take 1 tablet by mouth daily.    . pantoprazole (PROTONIX) 40 MG tablet Take 1 tablet (40 mg total) by mouth daily. 30 tablet 2  . polyethylene glycol powder (GLYCOLAX/MIRALAX) 17 GM/SCOOP powder Dissolve 17 grams (1 capful) into 8 oz water/juice and drink daily as needed for constipation 527 g 1  . propranolol (INDERAL) 80 MG tablet Take 1 tablet (80 mg total) by mouth 2 (two) times daily. 180 tablet 1  . telmisartan (MICARDIS) 80 MG tablet Take 1 tablet (80 mg total) by mouth daily. 90 tablet 1  . venlafaxine XR (EFFEXOR-XR) 75 MG 24 hr capsule Take 3 capsules (225 mg total) by mouth daily with breakfast. 270 capsule 0  . zolpidem (AMBIEN) 10 MG tablet Take 1 tablet (10 mg total) by mouth at bedtime as needed for sleep. 90 tablet 0   No current facility-administered medications on file prior to visit.    Past Medical History:  Diagnosis Date  . Allergic rhinitis, cause unspecified   . Allergy   . Anxiety   . Blood dyscrasia    platelets low in past  . Cataract   . Chronic back pain   . Chronic constipation   . Chronic sinusitis   . Depression   . Endometriosis   . ENDOMETRIOSIS 05/06/2009   Qualifier: Diagnosis of  By: Varney Daily RN, Butch Penny    . Esophageal stricture   . GERD (gastroesophageal reflux disease)   . Hemorrhoids   . Hiatal hernia   . Hyperlipidemia   . Hypertension   . Irritable bowel syndrome   . Lymphedema   . Osteoarthritis   . Personal history of diabetes mellitus    controlled with diet only  . Renal cell cancer ([place]) 11/2010 dx   R, s/p cryoablation 02/16/11  . Syncopal episodes    since childhood    Past Surgical History:  Procedure Laterality Date  . BREAST CYST EXCISION Bilateral over 10 years ago   No visable scar   . CATARACT EXTRACTION, BILATERAL Bilateral 2020  . COLONOSCOPY    . fallopian tube removed    .  FUNCTIONAL ENDOSCOPIC SINUS SURGERY    . HEMORRHOID BANDING    . LASER ABLATION OF THE CERVIX    . NASAL SINUS SURGERY    . 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 TOTAL KNEE ARTHROPLASTY Right 10/30/2016   Procedure: RIGHT TOTAL KNEE ARTHROPLASTY;  Surgeon: Meredith Pel, MD;  Location: [facility];  Service: Orthopedics;  Laterality: Right;  . TUBAL LIGATION    . UMBILICAL HERNIA REPAIR      Social History   Socioeconomic History  . Marital status: Legally Separated    Spouse name: Not on file  . Number of children: 4  . Years of education: Not on file  . Highest education level: Not on file  Occupational History  .  Occupation: Disabled  Tobacco Use  . Smoking status: Never Smoker  . Smokeless tobacco: Never Used  Substance and Sexual Activity  . Alcohol use: No    Comment: rare  . Drug use: No  . Sexual activity: Never  Other Topics Concern  . Not on file  Social History Narrative  . Not on file   Social Determinants of Health   Financial Resource Strain:   . Difficulty of Paying Living Expenses:   Food Insecurity:   . Worried About Charity fundraiser in the Last Year:   . Arboriculturist in the Last Year:   Transportation Needs:   . Film/video editor (Medical):   [name] Lack of Transportation (Non-Medical):   Physical Activity:   . Days of Exercise per Week:   . Minutes of Exercise per Session:   Stress:   . Feeling of Stress :   Social Connections:   . Frequency of Communication with Friends and Family:   . Frequency of Social Gatherings with Friends and Family:   . Attends Religious Services:   . Active Member of Clubs or Organizations:   . Attends Archivist Meetings:   [name] Marital Status:     Family History  Problem Relation Age of Onset  . Diabetes Mother   . Hypertension Mother   . Hyperlipidemia Mother   . Heart disease Mother   . Stroke Mother   . Kidney disease Mother   . Thyroid disease Mother   .  Sleep apnea Mother   . Obesity Mother   . Kidney disease Father   . Prostate cancer Father   . Alcoholism Father   . Alcohol abuse Father   . Colitis Brother   . Alcohol abuse Brother   . Leukemia Brother   . Multiple sclerosis Sister   . Colon polyps Other   . Breast cancer Maternal Aunt   . Breast cancer Cousin   . Alcohol abuse Daughter   . Colon cancer Neg Hx   . Esophageal cancer Neg Hx   . Pancreatic cancer Neg Hx   . Rectal cancer Neg Hx   . Stomach cancer Neg Hx     Review of Systems     Objective:  There were no vitals filed for this visit. BP Readings from Last 3 Encounters:  06/11/19 131/79  05/11/19 124/82  04/21/19 120/72   Wt Readings from Last 3 Encounters:  06/11/19 244 lb (110.7 kg)  05/28/19 246 lb 9.6 oz (111.9 kg)  05/11/19 244 lb (110.7 kg)   There is no height or weight on file to calculate BMI.   Physical Exam    Constitutional: Appears well-developed and well-nourished. No distress.  HENT:  Head: Normocephalic and atraumatic.  Neck: Neck supple. No tracheal deviation present. No thyromegaly present.  No cervical lymphadenopathy Cardiovascular: Normal rate, regular rhythm and normal heart sounds.   No murmur heard. No carotid bruit .  No edema Pulmonary/Chest: Effort normal and breath sounds normal. No respiratory distress. No has no wheezes. No rales.  Skin: Skin is warm and dry. Not diaphoretic.  Psychiatric: Normal mood and affect. Behavior is normal.      Assessment & Plan:    See Problem List for Assessment and Plan of chronic medical problems.    This visit occurred during the SARS-CoV-2 public health emergency.  Safety protocols were in place, including screening questions prior to the visit, additional usage of staff PPE, and extensive cleaning of exam room  while observing appropriate contact time as indicated for disinfecting solutions.    This encounter was created in error - please disregard.

## 2019-09-15 ENCOUNTER — Encounter: Payer: Medicare Other | Admitting: Internal Medicine

## 2019-09-15 DIAGNOSIS — Z0289 Encounter for other administrative examinations: Secondary | ICD-10-CM

## 2019-10-06 ENCOUNTER — Telehealth: Payer: Self-pay | Admitting: Internal Medicine

## 2019-10-06 DIAGNOSIS — H35033 Hypertensive retinopathy, bilateral: Secondary | ICD-10-CM | POA: Insufficient documentation

## 2019-10-06 DIAGNOSIS — E119 Type 2 diabetes mellitus without complications: Secondary | ICD-10-CM | POA: Insufficient documentation

## 2019-10-06 DIAGNOSIS — H35413 Lattice degeneration of retina, bilateral: Secondary | ICD-10-CM | POA: Insufficient documentation

## 2019-10-06 DIAGNOSIS — K2970 Gastritis, unspecified, without bleeding: Secondary | ICD-10-CM

## 2019-10-06 DIAGNOSIS — H209 Unspecified iridocyclitis: Secondary | ICD-10-CM

## 2019-10-06 DIAGNOSIS — Z961 Presence of intraocular lens: Secondary | ICD-10-CM

## 2019-10-06 HISTORY — DX: Hypertensive retinopathy, bilateral: H35.033

## 2019-10-06 HISTORY — DX: Presence of intraocular lens: Z96.1

## 2019-10-06 HISTORY — DX: Unspecified iridocyclitis: H20.9

## 2019-10-06 HISTORY — DX: Lattice degeneration of retina, bilateral: H35.413

## 2019-10-06 HISTORY — DX: Type 2 diabetes mellitus without complications: E11.9

## 2019-10-06 MED ORDER — POLYETHYLENE GLYCOL 3350 17 GM/SCOOP PO POWD: ORAL | 0 refills | Status: DC

## 2019-10-06 MED ORDER — PANTOPRAZOLE SODIUM 40 MG PO TBEC: 40.0000 mg | DELAYED_RELEASE_TABLET | Freq: Every day | ORAL | 0 refills | Status: DC

## 2019-10-06 MED ORDER — PANTOPRAZOLE SODIUM 40 MG PO TBEC: 40.0000 mg | DELAYED_RELEASE_TABLET | Freq: Every day | ORAL | 2 refills | Status: DC

## 2019-10-06 MED ORDER — POLYETHYLENE GLYCOL 3350 17 GM/SCOOP PO POWD: ORAL | 2 refills | Status: DC

## 2019-10-06 NOTE — Telephone Encounter (Signed)
Pt requested a refill for polyethylene and pantoprazole-3 months supply.

## 2019-10-06 NOTE — Telephone Encounter (Signed)
Rx sent for 3 month supply to pharmacy as requested.

## 2019-10-06 NOTE — Telephone Encounter (Signed)
Patient requesting refill on Polyethylene powder and also would like to discuss treatment of care for she says she does not understand it

## 2019-10-07 NOTE — Progress Notes (Deleted)
Subjective:    Patient ID: Janet Le, female    DOB: [birth year], 64 y.o.   MRN: [id number]  HPI The patient is here for an acute visit.   Soreness from fall:   Medications and allergies reviewed with patient and updated if appropriate.  Patient Active Problem List   Diagnosis Date Noted  . Nocturnal hypoxemia 05/11/2019  . Dyspnea on exertion 05/11/2019  . Excessive daytime sleepiness 05/11/2019  . Primary osteoarthritis involving multiple joints 03/10/2019  . Adult ADHD 01/08/2019  . Chronic post-traumatic stress disorder (PTSD) 08/12/2018  . Bilateral leg edema 06/12/2018  . Fatigue 11/14/2017  . Chronic back pain 11/14/2017  . Arthralgia of hip 11/14/2017  . Memory difficulties 10/08/2017  . Anxiety and depression 10/08/2017  . Neuropathy 10/07/2017  . LVH (left ventricular hypertrophy), mild 05/18/2017  . Arthritis of knee 10/30/2016  . S/P knee surgery 10/30/2016  . Burning pain 10/15/2016  . Prediabetes 05/30/2016  . Cervicalgia 05/30/2016  . Osteopenia 11/18/2015  . Thrombocytopenia ([place]) 05/30/2015  . Arthritis of left lower extremity 02/02/2015  . Gout 12/11/2013  . Obesity with serious comorbidity 11/06/2013  . Cough 07/15/2012  . Left lumbar radiculopathy 04/13/2012  . Renal cell cancer ([place])   . Hemorrhoids 11/14/2010  . IBS (irritable bowel syndrome) with chronic constipation 11/14/2010  . GERD (gastroesophageal reflux disease) 11/14/2010  . HYPERCHOLESTEROLEMIA, MILD 03/16/2010  . Lymphedema 03/15/2010  . ALLERGIC RHINITIS 08/31/2009  . MIGRAINE HEADACHE 06/14/2009  . SINUSITIS, CHRONIC 06/14/2009  . SYNCOPE 05/16/2009  . Essential hypertension 05/06/2009    Current Outpatient Medications on File Prior to Visit  Medication Sig Dispense Refill  . AMBULATORY NON FORMULARY MEDICATION 1 Units by Other route daily. Diabetic Shoes 1 Units 0  . amLODipine (NORVASC) 5 MG tablet Take 1 tablet (5 mg total) by mouth daily. 90 tablet 1  .  atorvastatin (LIPITOR) 10 MG tablet Take 1 tablet (10 mg total) by mouth daily. 90 tablet 1  . atropine 1 % ophthalmic solution     . blood glucose meter kit and supplies KIT Dispense based on patient and insurance preference. Use up to four times daily as directed. (FOR R73.03). 1 each 0  . calcium-vitamin D (OSCAL WITH D) 500-200 MG-UNIT tablet Take 1 tablet by mouth daily.     . cromolyn (OPTICROM) 4 % ophthalmic solution as needed.     . Diclofenac Sodium 2 % SOLN Apply 1 pump twice daily. (Patient taking differently: Apply 1 application topically 2 (two) times daily as needed (PAIN). Apply 1 pump twice daily.) 112 g 1  . dicyclomine (BENTYL) 10 MG capsule Take 1-2 capsules (10-20 mg total) by mouth 3 (three) times daily as needed for spasms. 150 capsule 2  . Elastic Bandages & Supports (MEDICAL COMPRESSION SOCKS) MISC Use as directed daily for lymphedema 4 each 0  . fluticasone (FLONASE) 50 MCG/ACT nasal spray Place 1 spray into both nostrils daily. (Patient taking differently: Place 1 spray into both nostrils as needed. ) 16 g 2  . furosemide (LASIX) 20 MG tablet Take 1 tablet (20 mg total) by mouth daily. 90 tablet 1  . hydrocortisone (ANUSOL-HC) 2.5 % rectal cream Place 1 application rectally 2 (two) times daily. (Patient taking differently: Place 1 application rectally 2 (two) times daily as needed. ) 30 g 1  . lidocaine (LIDODERM) 5 % Place 1 patch onto the skin daily. Remove & Discard patch within 12 hours or as directed by MD 90 patch 1  .  linaclotide (LINZESS) 145 MCG CAPS capsule Take 1 capsule (145 mcg total) by mouth daily before breakfast. 90 capsule 1  . methylphenidate (RITALIN) 20 MG tablet Take 1.5 tablets (30 mg total) by mouth 2 (two) times daily with breakfast and lunch. 90 tablet 0  . Multiple Vitamin (MULTIVITAMIN) tablet Take 1 tablet by mouth daily.    . pantoprazole (PROTONIX) 40 MG tablet Take 1 tablet (40 mg total) by mouth daily. PHARMACY-PLEASE D/C 1 MONTH RX SENT IN  ERROR 90 tablet 0  . polyethylene glycol powder (GLYCOLAX/MIRALAX) 17 GM/SCOOP powder Dissolve 17 grams (1 capful) into 8 oz water/juice and drink daily as needed for constipation. PHARMACY-PLEASE D/C 1 MONTH RX SENT IN ERROR 1581 g 0  . propranolol (INDERAL) 80 MG tablet Take 1 tablet (80 mg total) by mouth 2 (two) times daily. 180 tablet 1  . telmisartan (MICARDIS) 80 MG tablet Take 1 tablet (80 mg total) by mouth daily. 90 tablet 1  . venlafaxine XR (EFFEXOR-XR) 75 MG 24 hr capsule Take 3 capsules (225 mg total) by mouth daily with breakfast. 270 capsule 0  . zolpidem (AMBIEN) 10 MG tablet Take 1 tablet (10 mg total) by mouth at bedtime as needed for sleep. 90 tablet 0   No current facility-administered medications on file prior to visit.    Past Medical History:  Diagnosis Date  . Allergic rhinitis, cause unspecified   . Allergy   . Anxiety   . Blood dyscrasia    platelets low in past  . Cataract   . Chronic back pain   . Chronic constipation   . Chronic sinusitis   . Depression   . Endometriosis   . ENDOMETRIOSIS 05/06/2009   Qualifier: Diagnosis of  By: Varney Daily RN, Butch Penny    . Esophageal stricture   . GERD (gastroesophageal reflux disease)   . Hemorrhoids   . Hiatal hernia   . Hyperlipidemia   . Hypertension   . Irritable bowel syndrome   . Lymphedema   . Osteoarthritis   . Personal history of diabetes mellitus    controlled with diet only  . Renal cell cancer ([place]) 11/2010 dx   R, s/p cryoablation 02/16/11  . Syncopal episodes    since childhood    Past Surgical History:  Procedure Laterality Date  . BREAST CYST EXCISION Bilateral over 10 years ago   No visable scar   . CATARACT EXTRACTION, BILATERAL Bilateral 2020  . COLONOSCOPY    . fallopian tube removed    . FUNCTIONAL ENDOSCOPIC SINUS SURGERY    . HEMORRHOID BANDING    . LASER ABLATION OF THE CERVIX    . NASAL SINUS SURGERY    . 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 TOTAL  KNEE ARTHROPLASTY Right 10/30/2016   Procedure: RIGHT TOTAL KNEE ARTHROPLASTY;  Surgeon: Meredith Pel, MD;  Location: [facility];  Service: Orthopedics;  Laterality: Right;  . TUBAL LIGATION    . UMBILICAL HERNIA REPAIR      Social History   Socioeconomic History  . Marital status: Legally Separated    Spouse name: Not on file  . Number of children: 4  . Years of education: Not on file  . Highest education level: Not on file  Occupational History  . Occupation: Disabled  Tobacco Use  . Smoking status: Never Smoker  . Smokeless tobacco: Never Used  Substance and Sexual Activity  . Alcohol use: No    Comment: rare  .  Drug use: No  . Sexual activity: Never  Other Topics Concern  . Not on file  Social History Narrative  . Not on file   Social Determinants of Health   Financial Resource Strain:   . Difficulty of Paying Living Expenses:   Food Insecurity:   . Worried About Charity fundraiser in the Last Year:   . Arboriculturist in the Last Year:   Transportation Needs:   . Film/video editor (Medical):   [name] Lack of Transportation (Non-Medical):   Physical Activity:   . Days of Exercise per Week:   . Minutes of Exercise per Session:   Stress:   . Feeling of Stress :   Social Connections:   . Frequency of Communication with Friends and Family:   . Frequency of Social Gatherings with Friends and Family:   . Attends Religious Services:   . Active Member of Clubs or Organizations:   . Attends Archivist Meetings:   [name] Marital Status:     Family History  Problem Relation Age of Onset  . Diabetes Mother   . Hypertension Mother   . Hyperlipidemia Mother   . Heart disease Mother   . Stroke Mother   . Kidney disease Mother   . Thyroid disease Mother   . Sleep apnea Mother   . Obesity Mother   . Kidney disease Father   . Prostate cancer Father   . Alcoholism Father   . Alcohol abuse Father   . Colitis Brother   . Alcohol abuse Brother   . Leukemia  Brother   . Multiple sclerosis Sister   . Colon polyps Other   . Breast cancer Maternal Aunt   . Breast cancer Cousin   . Alcohol abuse Daughter   . Colon cancer Neg Hx   . Esophageal cancer Neg Hx   . Pancreatic cancer Neg Hx   . Rectal cancer Neg Hx   . Stomach cancer Neg Hx     Review of Systems     Objective:  There were no vitals filed for this visit. BP Readings from Last 3 Encounters:  06/11/19 131/79  05/11/19 124/82  04/21/19 120/72   Wt Readings from Last 3 Encounters:  06/11/19 244 lb (110.7 kg)  05/28/19 246 lb 9.6 oz (111.9 kg)  05/11/19 244 lb (110.7 kg)   There is no height or weight on file to calculate BMI.   Physical Exam         Assessment & Plan:    See Problem List for Assessment and Plan of chronic medical problems.     This visit occurred during the SARS-CoV-2 public health emergency.  Safety protocols were in place, including screening questions prior to the visit, additional usage of staff PPE, and extensive cleaning of exam room while observing appropriate contact time as indicated for disinfecting solutions.

## 2019-10-08 ENCOUNTER — Ambulatory Visit: Payer: Medicare Other | Admitting: Internal Medicine

## 2019-10-14 ENCOUNTER — Other Ambulatory Visit: Payer: Self-pay | Admitting: Nurse Practitioner

## 2019-10-14 ENCOUNTER — Other Ambulatory Visit (HOSPITAL_COMMUNITY): Payer: Self-pay | Admitting: Psychiatry

## 2019-10-25 ENCOUNTER — Other Ambulatory Visit: Payer: Self-pay | Admitting: Internal Medicine

## 2019-10-29 ENCOUNTER — Other Ambulatory Visit: Payer: Self-pay

## 2019-10-29 MED ORDER — ACCU-CHEK FASTCLIX LANCETS MISC: 1 refills | Status: DC

## 2019-11-04 NOTE — Progress Notes (Signed)
Virtual Visit via Video Note  I connected with Janet Le on 11/05/19 at 11:15 AM EDT by a video enabled telemedicine application and verified that I am speaking with the correct person using two identifiers.   I discussed the limitations of evaluation and management by telemedicine and the availability of in person appointments. The patient expressed understanding and agreed to proceed.  Present for the visit:  Myself, Dr Billey Gosling, [facility].  The patient is currently at home and I am in the office.    No referring provider.    History of Present Illness:       Social History   Socioeconomic History  . Marital status: Legally Separated    Spouse name: Not on file  . Number of children: 4  . Years of education: Not on file  . Highest education level: Not on file  Occupational History  . Occupation: Disabled  Tobacco Use  . Smoking status: Never Smoker  . Smokeless tobacco: Never Used  Substance and Sexual Activity  . Alcohol use: No    Comment: rare  . Drug use: No  . Sexual activity: Never  Other Topics Concern  . Not on file  Social History Narrative  . Not on file   Social Determinants of Health   Financial Resource Strain:   . Difficulty of Paying Living Expenses:   Food Insecurity:   . Worried About Charity fundraiser in the Last Year:   . Arboriculturist in the Last Year:   Transportation Needs:   . Film/video editor (Medical):   [name] Lack of Transportation (Non-Medical):   Physical Activity:   . Days of Exercise per Week:   . Minutes of Exercise per Session:   Stress:   . Feeling of Stress :   Social Connections:   . Frequency of Communication with Friends and Family:   . Frequency of Social Gatherings with Friends and Family:   . Attends Religious Services:   . Active Member of Clubs or Organizations:   . Attends Archivist Meetings:   [name] Marital Status:      Observations/Objective: Appears well in NAD   Assessment  and Plan:  See Problem List for Assessment and Plan of chronic medical problems.   Follow Up Instructions:    I discussed the assessment and treatment plan with the patient. The patient was provided an opportunity to ask questions and all were answered. The patient agreed with the plan and demonstrated an understanding of the instructions.   The patient was advised to call back or seek an in-person evaluation if the symptoms worsen or if the condition fails to improve as anticipated.    Binnie Rail, MD  This encounter was created in error - please disregard.

## 2019-11-05 ENCOUNTER — Ambulatory Visit: Payer: Medicare Other | Admitting: Pulmonary Disease

## 2019-11-05 ENCOUNTER — Encounter: Payer: Medicare Other | Admitting: Internal Medicine

## 2019-11-05 ENCOUNTER — Encounter: Payer: Self-pay | Admitting: Internal Medicine

## 2019-11-05 NOTE — Progress Notes (Signed)
Subjective:    Patient ID: Janet Le, female    DOB: July 02, 1955, 64 y.o.   MRN: [id number]  HPI The patient is here for follow up of her chronic medical problems.    Soreness from fall:  She has fallen three different times in the past few months.  She thinks it may also be from the Ritalin ( she thinks it may have also caused some confusion).    She is not exercising.  She continues to have chronic joint pain.    The eye lids are swollen and itchy. She wonders if I can give her an ointment.      Medications and allergies reviewed with patient and updated if appropriate.  Patient Active Problem List   Diagnosis Date Noted  . Nocturnal hypoxemia 05/11/2019  . Dyspnea on exertion 05/11/2019  . Excessive daytime sleepiness 05/11/2019  . Primary osteoarthritis involving multiple joints 03/10/2019  . Adult ADHD 01/08/2019  . Chronic post-traumatic stress disorder (PTSD) 08/12/2018  . Bilateral leg edema 06/12/2018  . Fatigue 11/14/2017  . Chronic back pain 11/14/2017  . Arthralgia of hip 11/14/2017  . Memory difficulties 10/08/2017  . Anxiety and depression 10/08/2017  . Neuropathy 10/07/2017  . LVH (left ventricular hypertrophy), mild 05/18/2017  . Arthritis of knee 10/30/2016  . S/P knee surgery 10/30/2016  . Prediabetes 05/30/2016  . Cervicalgia 05/30/2016  . Osteopenia 11/18/2015  . Thrombocytopenia ([place]) 05/30/2015  . Arthritis of left lower extremity 02/02/2015  . Gout 12/11/2013  . Obesity with serious comorbidity 11/06/2013  . Cough 07/15/2012  . Left lumbar radiculopathy 04/13/2012  . Renal cell cancer ([place])   . Hemorrhoids 11/14/2010  . IBS (irritable bowel syndrome) with chronic constipation 11/14/2010  . GERD (gastroesophageal reflux disease) 11/14/2010  . HYPERCHOLESTEROLEMIA, MILD 03/16/2010  . Lymphedema 03/15/2010  . ALLERGIC RHINITIS 08/31/2009  . MIGRAINE HEADACHE 06/14/2009  . SINUSITIS, CHRONIC 06/14/2009  . SYNCOPE 05/16/2009  .  Essential hypertension 05/06/2009    Current Outpatient Medications on File Prior to Visit  Medication Sig Dispense Refill  . AMBULATORY NON FORMULARY MEDICATION 1 Units by Other route daily. Diabetic Shoes 1 Units 0  . amLODipine (NORVASC) 5 MG tablet Take 1 tablet (5 mg total) by mouth daily. 90 tablet 1  . atorvastatin (LIPITOR) 10 MG tablet Take 1 tablet (10 mg total) by mouth daily. 90 tablet 1  . atropine 1 % ophthalmic solution     . blood glucose meter kit and supplies KIT Dispense based on patient and insurance preference. Use up to four times daily as directed. (FOR R73.03). 1 each 0  . calcium-vitamin D (OSCAL WITH D) 500-200 MG-UNIT tablet Take 1 tablet by mouth daily.     . cromolyn (OPTICROM) 4 % ophthalmic solution as needed.     . Diclofenac Sodium 2 % SOLN Apply 1 pump twice daily. (Patient taking differently: Apply 1 application topically 2 (two) times daily as needed (PAIN). Apply 1 pump twice daily.) 112 g 1  . dicyclomine (BENTYL) 10 MG capsule Take 1-2 capsules (10-20 mg total) by mouth 3 (three) times daily as needed for spasms. 150 capsule 2  . dorzolamide-timolol (COSOPT) 22.3-6.8 MG/ML ophthalmic solution INSTILL 1 DROP INTO RIGHT EYE TWICE DAILY    . DUREZOL 0.05 % EMUL Place 1 drop into the right eye 4 (four) times daily.    Water engineer Bandages & Supports (MEDICAL COMPRESSION SOCKS) MISC Use as directed daily for lymphedema 4 each 0  . furosemide (LASIX) 20  MG tablet Take 1 tablet (20 mg total) by mouth daily. 90 tablet 1  . linaclotide (LINZESS) 145 MCG CAPS capsule Take 1 capsule (145 mcg total) by mouth daily before breakfast. 90 capsule 1  . Multiple Vitamin (MULTIVITAMIN) tablet Take 1 tablet by mouth daily.    . Olopatadine HCl (PATADAY) 0.2 % SOLN Apply to eye.    . pantoprazole (PROTONIX) 40 MG tablet Take 1 tablet (40 mg total) by mouth daily. PHARMACY-PLEASE D/C 1 MONTH RX SENT IN ERROR 90 tablet 0  . polyethylene glycol powder (GLYCOLAX/MIRALAX) 17 GM/SCOOP  powder Dissolve 17 grams (1 capful) into 8 oz water/juice and drink daily as needed for constipation. PHARMACY-PLEASE D/C 1 MONTH RX SENT IN ERROR 1581 g 0  . PROCTO-MED HC 2.5 % rectal cream APPLY  CREAM RECTALLY AT BEDTIME FOR  UP  TO  10  DAYS 30 g 0  . propranolol (INDERAL) 80 MG tablet Take 1 tablet (80 mg total) by mouth 2 (two) times daily. 180 tablet 1  . telmisartan (MICARDIS) 80 MG tablet Take 1 tablet (80 mg total) by mouth daily. 90 tablet 1  . venlafaxine XR (EFFEXOR-XR) 75 MG 24 hr capsule TAKE 3 CAPSULES BY MOUTH ONCE DAILY WITH BREAKFAST 270 capsule 0  . zolpidem (AMBIEN) 10 MG tablet Take 1 tablet (10 mg total) by mouth at bedtime as needed for sleep. 90 tablet 0   No current facility-administered medications on file prior to visit.    Past Medical History:  Diagnosis Date  . Allergic rhinitis, cause unspecified   . Allergy   . Anxiety   . Blood dyscrasia    platelets low in past  . Cataract   . Chronic back pain   . Chronic constipation   . Chronic sinusitis   . Depression   . Endometriosis   . ENDOMETRIOSIS 05/06/2009   Qualifier: Diagnosis of  By: Varney Daily RN, Butch Penny    . Esophageal stricture   . GERD (gastroesophageal reflux disease)   . Hemorrhoids   . Hiatal hernia   . Hyperlipidemia   . Hypertension   . Irritable bowel syndrome   . Lymphedema   . Osteoarthritis   . Personal history of diabetes mellitus    controlled with diet only  . Renal cell cancer ([place]) 11/2010 dx   R, s/p cryoablation 02/16/11  . Syncopal episodes    since childhood    Past Surgical History:  Procedure Laterality Date  . BREAST CYST EXCISION Bilateral over 10 years ago   No visable scar   . CATARACT EXTRACTION, BILATERAL Bilateral 2020  . COLONOSCOPY    . fallopian tube removed    . FUNCTIONAL ENDOSCOPIC SINUS SURGERY    . HEMORRHOID BANDING    . LASER ABLATION OF THE CERVIX    . NASAL SINUS SURGERY    . 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  TOTAL KNEE ARTHROPLASTY Right 10/30/2016   Procedure: RIGHT TOTAL KNEE ARTHROPLASTY;  Surgeon: Meredith Pel, MD;  Location: [facility];  Service: Orthopedics;  Laterality: Right;  . TUBAL LIGATION    . UMBILICAL HERNIA REPAIR      Social History   Socioeconomic History  . Marital status: Legally Separated    Spouse name: Not on file  . Number of children: 4  . Years of education: Not on file  . Highest education level: Not on file  Occupational History  . Occupation: Disabled  Tobacco Use  .  Smoking status: Never Smoker  . Smokeless tobacco: Never Used  Substance and Sexual Activity  . Alcohol use: No    Comment: rare  . Drug use: No  . Sexual activity: Never  Other Topics Concern  . Not on file  Social History Narrative  . Not on file   Social Determinants of Health   Financial Resource Strain:   . Difficulty of Paying Living Expenses:   Food Insecurity:   . Worried About Charity fundraiser in the Last Year:   . Arboriculturist in the Last Year:   Transportation Needs:   . Film/video editor (Medical):   [name] Lack of Transportation (Non-Medical):   Physical Activity:   . Days of Exercise per Week:   . Minutes of Exercise per Session:   Stress:   . Feeling of Stress :   Social Connections:   . Frequency of Communication with Friends and Family:   . Frequency of Social Gatherings with Friends and Family:   . Attends Religious Services:   . Active Member of Clubs or Organizations:   . Attends Archivist Meetings:   [name] Marital Status:     Family History  Problem Relation Age of Onset  . Diabetes Mother   . Hypertension Mother   . Hyperlipidemia Mother   . Heart disease Mother   . Stroke Mother   . Kidney disease Mother   . Thyroid disease Mother   . Sleep apnea Mother   . Obesity Mother   . Kidney disease Father   . Prostate cancer Father   . Alcoholism Father   . Alcohol abuse Father   . Colitis Brother   . Alcohol abuse Brother   .  Leukemia Brother   . Multiple sclerosis Sister   . Colon polyps Other   . Breast cancer Maternal Aunt   . Breast cancer Cousin   . Alcohol abuse Daughter   . Colon cancer Neg Hx   . Esophageal cancer Neg Hx   . Pancreatic cancer Neg Hx   . Rectal cancer Neg Hx   . Stomach cancer Neg Hx     Review of Systems  Constitutional: Negative for chills and fever.  Eyes: Positive for visual disturbance.  Respiratory: Negative for cough, shortness of breath and wheezing.   Cardiovascular: Positive for leg swelling. Negative for chest pain and palpitations.  Neurological: Positive for headaches (from eyes). Negative for light-headedness.       Objective:   Vitals:   11/06/19 1129  BP: 122/78  Pulse: (!) 59  Resp: 16  Temp: 98.4 F (36.9 C)  SpO2: 99%   BP Readings from Last 3 Encounters:  11/06/19 122/78  06/11/19 131/79  05/11/19 124/82   Wt Readings from Last 3 Encounters:  11/06/19 242 lb (109.8 kg)  06/11/19 244 lb (110.7 kg)  05/28/19 246 lb 9.6 oz (111.9 kg)   Body mass index is 44.26 kg/m.   Physical Exam    Constitutional: Appears well-developed and well-nourished. No distress.  Head: Normocephalic and atraumatic.  Neck: Neck supple. No tracheal deviation present. No thyromegaly present.  No cervical lymphadenopathy Cardiovascular: Normal rate, regular rhythm and normal heart sounds.  No murmur heard. No carotid bruit .  Trace b/l LE edema Pulmonary/Chest: Effort normal and breath sounds normal. No respiratory distress. No has no wheezes. No rales.  Skin: Skin is warm and dry. Not diaphoretic.  Psychiatric: Normal mood and affect. Behavior is normal.  Assessment & Plan:    See Problem List for Assessment and Plan of chronic medical problems.    This visit occurred during the SARS-CoV-2 public health emergency.  Safety protocols were in place, including screening questions prior to the visit, additional usage of staff PPE, and extensive cleaning of  exam room while observing appropriate contact time as indicated for disinfecting solutions.

## 2019-11-06 ENCOUNTER — Ambulatory Visit: Payer: Medicare Other | Admitting: Internal Medicine

## 2019-11-06 ENCOUNTER — Encounter: Payer: Self-pay | Admitting: Internal Medicine

## 2019-11-06 ENCOUNTER — Other Ambulatory Visit: Payer: Self-pay

## 2019-11-06 VITALS — BP 122/78 | HR 59 | Temp 98.4°F | Resp 16 | Ht 62.0 in | Wt 242.0 lb

## 2019-11-06 DIAGNOSIS — R600 Localized edema: Secondary | ICD-10-CM | POA: Diagnosis not present

## 2019-11-06 DIAGNOSIS — I10 Essential (primary) hypertension: Secondary | ICD-10-CM

## 2019-11-06 DIAGNOSIS — H0100A Unspecified blepharitis right eye, upper and lower eyelids: Secondary | ICD-10-CM

## 2019-11-06 DIAGNOSIS — R7303 Prediabetes: Secondary | ICD-10-CM | POA: Diagnosis not present

## 2019-11-06 DIAGNOSIS — E7800 Pure hypercholesterolemia, unspecified: Secondary | ICD-10-CM | POA: Diagnosis not present

## 2019-11-06 DIAGNOSIS — H0100B Unspecified blepharitis left eye, upper and lower eyelids: Secondary | ICD-10-CM

## 2019-11-06 MED ORDER — FLUTICASONE PROPIONATE 50 MCG/ACT NA SUSP: 1.0000 | NASAL | 2 refills | Status: DC | PRN

## 2019-11-06 MED ORDER — ACCU-CHEK GUIDE VI STRP: ORAL_STRIP | 1 refills | Status: DC

## 2019-11-06 MED ORDER — LIDOCAINE 5 % EX PTCH: 1.0000 | MEDICATED_PATCH | CUTANEOUS | 1 refills | Status: DC

## 2019-11-06 MED ORDER — ERYTHROMYCIN 5 MG/GM OP OINT: 1.0000 "application " | TOPICAL_OINTMENT | Freq: Every day | OPHTHALMIC | 0 refills | Status: DC

## 2019-11-06 MED ORDER — ACCU-CHEK FASTCLIX LANCETS MISC: 1 refills | Status: DC

## 2019-11-06 NOTE — Patient Instructions (Signed)
   Medications reviewed and updated.  Changes include :  erythromycin ointment   Your prescription(s) have been submitted to your pharmacy. Please take as directed and contact our office if you believe you are having problem(s) with the medication(s).     Please followup in 6 months

## 2019-11-07 DIAGNOSIS — H01009 Unspecified blepharitis unspecified eye, unspecified eyelid: Secondary | ICD-10-CM | POA: Insufficient documentation

## 2019-11-07 HISTORY — DX: Unspecified blepharitis unspecified eye, unspecified eyelid: H01.009

## 2019-11-07 NOTE — Assessment & Plan Note (Signed)
Chronic BP well controlled Current regimen effective and well tolerated Continue current medications at current doses  

## 2019-11-07 NOTE — Assessment & Plan Note (Signed)
Chronic Stress weight loss Low sugar / carb diet Stressed regular exercise

## 2019-11-07 NOTE — Assessment & Plan Note (Signed)
Chronic Controlled Continue lasix

## 2019-11-07 NOTE — Assessment & Plan Note (Signed)
Chronic Continue daily statin Regular exercise and healthy diet encouraged Advised weight loss

## 2019-11-07 NOTE — Assessment & Plan Note (Signed)
Acute Mild Erythromycin ointment at night Will f/u with ophthalmology

## 2019-11-10 ENCOUNTER — Ambulatory Visit: Payer: Medicare Other | Admitting: Internal Medicine

## 2019-11-11 ENCOUNTER — Other Ambulatory Visit: Payer: Self-pay

## 2019-11-11 MED ORDER — ACCU-CHEK GUIDE VI STRP: ORAL_STRIP | 1 refills | Status: DC

## 2019-11-24 ENCOUNTER — Telehealth: Payer: Self-pay

## 2019-11-24 NOTE — Telephone Encounter (Signed)
COVID-19 Pre-Screening Questions:11/24/19  Do you currently have a fever (>100 F), chills or unexplained body aches?NO  Are you currently experiencing new cough, shortness of breath, sore throat, runny nose?NO .  Have you recently travelled outside the state of [place] in the last 14 days? NO .  Have you been in contact with someone that is currently pending confirmation of Covid19 testing or has been confirmed to have the Covid19 virus? NO  **If the patient answers NO to ALL questions -  advise the patient to please call the clinic before coming to the office should any symptoms develop.     

## 2019-11-25 ENCOUNTER — Ambulatory Visit: Payer: Medicare Other | Admitting: Internal Medicine

## 2019-11-25 ENCOUNTER — Other Ambulatory Visit: Payer: Self-pay

## 2019-11-25 ENCOUNTER — Other Ambulatory Visit: Payer: Self-pay | Admitting: Internal Medicine

## 2019-11-25 ENCOUNTER — Ambulatory Visit
Admission: RE | Admit: 2019-11-25 | Discharge: 2019-11-25 | Disposition: A | Discharge status: Home / Self Care | Payer: Medicare Other | Source: Ambulatory Visit | Attending: Internal Medicine | Admitting: Internal Medicine

## 2019-11-25 ENCOUNTER — Encounter: Payer: Self-pay | Admitting: Internal Medicine

## 2019-11-25 DIAGNOSIS — Z1231 Encounter for screening mammogram for malignant neoplasm of breast: Secondary | ICD-10-CM

## 2019-11-25 DIAGNOSIS — Z227 Latent tuberculosis: Secondary | ICD-10-CM

## 2019-11-25 HISTORY — DX: Latent tuberculosis: Z22.7

## 2019-11-25 MED ORDER — ISONIAZID 300 MG PO TABS: 300.0000 mg | ORAL_TABLET | Freq: Every day | ORAL | 8 refills | Status: DC

## 2019-11-25 MED ORDER — VITAMIN B-6 50 MG PO TABS
50.0000 mg | ORAL_TABLET | Freq: Every day | ORAL | 8 refills | Status: DC
Start: 2019-11-25 — End: 2021-09-07

## 2019-11-25 NOTE — Progress Notes (Signed)
[name]      Reason for Consult:  Positive Janet Le   Referring Physician: Dr. Dwana Melena    Patient ID: Janet Le, female    DOB: 09-27-1955, 64 y.o.   MRN: [id number]  HPI:   Here for evaluation of a positive Tb Gold test.  She gives a history of red, irritated eyes and followed by Dr. Katy Fitch and sent to Dr. Manuella Ghazi for evaluation.  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  As part of her evaluation, she had multiple blood tests with a Quantiferon Gold positive and ANA positive.  She has no recent history of pneumonia, fever, weight loss, hemoptysis or other signs of active Tb.  She has no joint complaints.  No rash.  She had a negative Toxo serology, negative RPR.  No HIV test.    Past Medical History:  Diagnosis Date  . Allergic rhinitis, cause unspecified   . Allergy   . Anxiety   . Blood dyscrasia    platelets low in past  . Cataract   . Chronic back pain   . Chronic constipation   . Chronic sinusitis   . Depression   . Endometriosis   . ENDOMETRIOSIS 05/06/2009   Qualifier: Diagnosis of  By: Varney Daily RN, Butch Penny    . Esophageal stricture   . GERD (gastroesophageal reflux disease)   . Hemorrhoids   . Hiatal hernia   . Hyperlipidemia   . Hypertension   . Irritable bowel syndrome   . Lymphedema   . Osteoarthritis   . Personal history of diabetes mellitus    controlled with diet only  . Renal cell cancer ([place]) 11/2010 dx   R, s/p cryoablation 02/16/11  . Syncopal episodes    since childhood    Prior to Admission medications   Medication Sig Start Date End Date Taking? Authorizing Provider  Accu-Chek FastClix Lancets MISC USE AS DIRECTED UP  TO  1 TIME  DAILY- DX CODE R73.03 11/06/19   Binnie Rail, MD  AMBULATORY NON FORMULARY MEDICATION 1 Units by Other route daily. Diabetic Shoes 04/15/19   Metta Clines R, DO  amLODipine (NORVASC) 5 MG  tablet Take 1 tablet (5 mg total) by mouth daily. 06/24/19   Binnie Rail, MD  atorvastatin (LIPITOR) 10 MG tablet Take 1 tablet (10 mg total) by mouth daily. 06/24/19   Binnie Rail, MD  atropine 1 % ophthalmic solution  03/09/19   [provider]  blood glucose meter kit and supplies KIT Dispense based on patient and insurance preference. Use up to four times daily as directed. (FOR R73.03). 03/23/19   Binnie Rail, MD  calcium-vitamin D (OSCAL WITH D) 500-200 MG-UNIT tablet Take 1 tablet by mouth daily.     [provider]  cromolyn (OPTICROM) 4 % ophthalmic solution as needed.     [provider]  Diclofenac Sodium 2 % SOLN Apply 1 pump twice daily. Patient taking differently: Apply 1 application topically 2 (two) times daily as needed (PAIN). Apply 1 pump twice daily. 08/22/15   Lyndal Pulley, DO  dicyclomine (BENTYL) 10 MG capsule Take 1-2 capsules (10-20 mg total) by mouth 3 (three) times daily as needed for spasms. 04/21/19   Pyrtle, Lajuan Lines, MD  dorzolamide-timolol (COSOPT) 22.3-6.8 MG/ML ophthalmic solution INSTILL 1 DROP INTO RIGHT EYE TWICE DAILY 08/27/19   [provider]  DUREZOL 0.05 % EMUL  Place 1 drop into the right eye 4 (four) times daily. 10/16/19   [provider]  Elastic Bandages & Supports (MEDICAL COMPRESSION SOCKS) MISC Use as directed daily for lymphedema 04/22/19   Binnie Rail, MD  erythromycin ophthalmic ointment Place 1 application into both eyes at bedtime. 11/06/19   Binnie Rail, MD  fluticasone (FLONASE) 50 MCG/ACT nasal spray Place 1 spray into both nostrils as needed. 11/06/19   Binnie Rail, MD  furosemide (LASIX) 20 MG tablet Take 1 tablet (20 mg total) by mouth daily. 06/24/19   Binnie Rail, MD  glucose blood (ACCU-CHEK GUIDE) test strip Use to check sugars once daily. Dx Code-R73.03 11/11/19   Binnie Rail, MD  lidocaine (LIDODERM) 5 % Place 1 patch onto the skin daily. Remove & Discard patch within 12  hours or as directed by MD 11/06/19   Binnie Rail, MD  linaclotide [facility]) 145 MCG CAPS capsule Take 1 capsule (145 mcg total) by mouth daily before breakfast. 03/17/19   Pyrtle, Lajuan Lines, MD  Multiple Vitamin (MULTIVITAMIN) tablet Take 1 tablet by mouth daily.    [provider]  Olopatadine HCl (PATADAY) 0.2 % SOLN Apply to eye.    [provider]  pantoprazole (PROTONIX) 40 MG tablet Take 1 tablet (40 mg total) by mouth daily. PHARMACY-PLEASE D/C 1 MONTH RX SENT IN ERROR 10/06/19   Pyrtle, Lajuan Lines, MD  polyethylene glycol powder (GLYCOLAX/MIRALAX) 17 GM/SCOOP powder Dissolve 17 grams (1 capful) into 8 oz water/juice and drink daily as needed for constipation. PHARMACY-PLEASE D/C 1 MONTH RX SENT IN ERROR 10/06/19   Pyrtle, Lajuan Lines, MD  PROCTO-MED HC 2.5 % rectal cream APPLY  CREAM RECTALLY AT BEDTIME FOR  UP  TO  10  DAYS 10/15/19   Willia Craze, NP  propranolol (INDERAL) 80 MG tablet Take 1 tablet (80 mg total) by mouth 2 (two) times daily. 06/24/19   Binnie Rail, MD  telmisartan (MICARDIS) 80 MG tablet Take 1 tablet (80 mg total) by mouth daily. 06/24/19   Binnie Rail, MD  venlafaxine XR (EFFEXOR-XR) 75 MG 24 hr capsule TAKE 3 CAPSULES BY MOUTH ONCE DAILY WITH BREAKFAST 10/15/19   Pucilowski, Olgierd A, MD  zolpidem (AMBIEN) 10 MG tablet Take 1 tablet (10 mg total) by mouth at bedtime as needed for sleep. 02/10/19 06/11/19  Pucilowski, Marchia Bond, MD    Allergies  Allergen Reactions  . Sertraline Hcl Other (See Comments)    Spasms; numbness  . Ace Inhibitors Cough  . Codeine Palpitations  . Darifenacin Hydrobromide Er Other (See Comments)    Pt states made her feel queezy & drunk  . Gabapentin Other (See Comments)    Hallucinations  . Pravastatin Other (See Comments)    myalgias  . Propoxyphene Hcl Palpitations  . Wellbutrin [Bupropion Hcl] Other (See Comments)    Numbness of mouth/lips    Social History   Tobacco Use  . Smoking status: Never Smoker  .  Smokeless tobacco: Never Used  Substance Use Topics  . Alcohol use: No    Comment: rare  . Drug use: No    Family History  Problem Relation Age of Onset  . Diabetes Mother   . Hypertension Mother   . Hyperlipidemia Mother   . Heart disease Mother   . Stroke Mother   . Kidney disease Mother   . Thyroid disease Mother   . Sleep apnea Mother   . Obesity Mother   .  Kidney disease Father   . Prostate cancer Father   . Alcoholism Father   . Alcohol abuse Father   . Colitis Brother   . Alcohol abuse Brother   . Leukemia Brother   . Multiple sclerosis Sister   . Colon polyps Other   . Breast cancer Maternal Aunt   . Breast cancer Cousin   . Alcohol abuse Daughter   . Colon cancer Neg Hx   . Esophageal cancer Neg Hx   . Pancreatic cancer Neg Hx   . Rectal cancer Neg Hx   . Stomach cancer Neg Hx     Review of Systems  Constitutional: negative for fevers, chills, sweats, anorexia and weight loss Eyes: positive for visual disturbance, irritation, redness, no new symptoms in the last year Gastrointestinal: negative for diarrhea Integument/breast: negative for rash All other systems reviewed and are negative    Constitutional: in no apparent distress There were no vitals filed for this visit. EYES: anicteric Cardiovascular: Cor RRR Respiratory: clear Musculoskeletal: no pedal edema noted Skin: negatives: no rash Neuro: non-focal  Labs: Lab Results  Component Value Date   WBC 7.5 03/10/2019   HGB 12.4 03/10/2019   HCT 37.9 03/10/2019   MCV 94.9 03/10/2019   PLT 141.0 (L) 03/10/2019    Lab Results  Component Value Date   CREATININE 0.92 04/21/2019   BUN 11 04/21/2019   NA 138 03/10/2019   K 4.3 03/10/2019   CL 103 03/10/2019   CO2 30 03/10/2019    Lab Results  Component Value Date   ALT 12 03/10/2019   AST 14 03/10/2019   ALKPHOS 107 03/10/2019   BILITOT 0.3 03/10/2019   INR 0.91 02/08/2011     Assessment: latent Tb, iridocyclitis.  No signs of active Tb  but will check a CXR.  I discussed with her treatment options for latent Tb and will start [place] for 9 months if the CXR is clear. INH does not typically interact with immunosuppressive medications and she can start those concurrently with INH as indicated by Dr. Manuella Ghazi.    Plan: 1) INH + B6 if CXR negative 2) CXR rtc 4 weeks for hepatic panel, HIV

## 2019-11-26 ENCOUNTER — Ambulatory Visit
Admission: RE | Admit: 2019-11-26 | Discharge: 2019-11-26 | Disposition: A | Discharge status: Home / Self Care | Payer: Medicare Other | Source: Ambulatory Visit | Attending: Internal Medicine | Admitting: Internal Medicine

## 2019-11-26 DIAGNOSIS — Z1231 Encounter for screening mammogram for malignant neoplasm of breast: Secondary | ICD-10-CM

## 2019-12-04 ENCOUNTER — Telehealth: Payer: Self-pay | Admitting: Internal Medicine

## 2019-12-04 ENCOUNTER — Telehealth: Payer: Self-pay | Admitting: *Deleted

## 2019-12-04 NOTE — Telephone Encounter (Signed)
Patient confused, had questions about her syphilis test results and why she had to take 2 pills from Dr Linus Salmons.  RN reviewed the lab results - RPR was negative.  Patient stated that Dr Linus Salmons told her she did not have syphilis and now she believes him.  RN reviewed why she is taking both Isoniazid and B6, she feels better.  She will start these today. Landis Gandy, RN

## 2019-12-04 NOTE — Telephone Encounter (Signed)
New message:   Pt states Isoniazid 300 mg was giving to her for a STD that one Dr said she had and then another doctor tested her and said she didn't have. She states that this medication cause confusion and she doesn't want to take this medication. She would like to be prescribed something else. Please advise.

## 2019-12-07 NOTE — Telephone Encounter (Signed)
This was prescribed by ID - it looks like she has talked to them about this.  They are managing.

## 2019-12-07 NOTE — Telephone Encounter (Signed)
Called and left message for patient today 

## 2020-02-10 ENCOUNTER — Other Ambulatory Visit: Payer: Self-pay | Admitting: Internal Medicine

## 2020-02-10 ENCOUNTER — Other Ambulatory Visit (HOSPITAL_COMMUNITY): Payer: Self-pay | Admitting: Psychiatry

## 2020-03-04 ENCOUNTER — Other Ambulatory Visit: Payer: Self-pay | Admitting: Internal Medicine

## 2020-03-14 ENCOUNTER — Telehealth: Payer: Self-pay | Admitting: Internal Medicine

## 2020-03-14 NOTE — Telephone Encounter (Signed)
    Patient requesting a letter to provide [address] ([place] , phone [phone], fax [phone]) to help get assistance. Patients states she needs assistance with getting in and out of the bath. She has had several falls.  Next appointment 05/10/20

## 2020-03-14 NOTE — Telephone Encounter (Signed)
Letter printed.

## 2020-03-15 NOTE — Telephone Encounter (Signed)
Letter faxed today for patient.

## 2020-03-30 ENCOUNTER — Telehealth: Payer: Self-pay | Admitting: Internal Medicine

## 2020-03-30 NOTE — Telephone Encounter (Signed)
Patient states she needs a letter stating she is unable to attend jury because of her disabilities  Patient would like to pick the letter up

## 2020-03-31 NOTE — Telephone Encounter (Signed)
Letter printed.  I can sign tomorrow. We may need her jury #

## 2020-03-31 NOTE — Telephone Encounter (Signed)
Left message for patient that letter would be ready tomorrow for pick up and to call me back with juror number if it needs to be added.

## 2020-04-04 NOTE — Telephone Encounter (Signed)
Pt called back with her Juror number. It is [phone]. Thanks.

## 2020-04-05 ENCOUNTER — Encounter: Payer: Self-pay | Admitting: Internal Medicine

## 2020-04-05 NOTE — Telephone Encounter (Signed)
Tried to reach patient but voicemail was full. Sent my-chart message to let her know letter was ready for pick up and up front.

## 2020-04-05 NOTE — Progress Notes (Signed)
Subjective:    Patient ID: Janet Le, female    DOB: [birth year], 64 y.o.   MRN: [id number]  HPI The patient is here for an acute visit.   She still has pain in her neck and lower back. She puts on an otc pain gel.  It helps a little.  She has seen Dr Nelva Bush who advised PT, which she did not do.    She also has pain in her right lateral side.  She has the pain when she eats. When she takes a bite she feels pain in the side and feels like the food gets stuck.   She has headaches which are probably form her eyes.  She saw a retina specialist.   She does exercise inside her house.      She falls frequently.  It feels like her weight is going on one side. She does not use her cane or walker inside.  She is falling inside.  She has a follow up with Dr Tomi Likens and Dr Montel Culver.  Her depression and anxiety are not controlled.  She has discussed her memory concerns with neurology in the past and she is still concerned about her memory.  She is interested in bariatric surgery.   Medications and allergies reviewed with patient and updated if appropriate.  Patient Active Problem List   Diagnosis Date Noted  . TB lung, latent 11/25/2019  . Blepharitis 11/07/2019  . Nocturnal hypoxemia 05/11/2019  . Dyspnea on exertion 05/11/2019  . Excessive daytime sleepiness 05/11/2019  . Primary osteoarthritis involving multiple joints 03/10/2019  . Adult ADHD 01/08/2019  . Chronic post-traumatic stress disorder (PTSD) 08/12/2018  . Bilateral leg edema 06/12/2018  . Fatigue 11/14/2017  . Chronic back pain 11/14/2017  . Arthralgia of hip 11/14/2017  . Memory difficulties 10/08/2017  . Anxiety and depression 10/08/2017  . Neuropathy 10/07/2017  . LVH (left ventricular hypertrophy), mild 05/18/2017  . Arthritis of knee 10/30/2016  . S/P knee surgery 10/30/2016  . Prediabetes 05/30/2016  . Cervicalgia 05/30/2016  . Osteopenia 11/18/2015  . Thrombocytopenia ([place]) 05/30/2015  . Arthritis of  left lower extremity 02/02/2015  . Gout 12/11/2013  . Morbid obesity ([place]) 11/06/2013  . Cough 07/15/2012  . Left lumbar radiculopathy 04/13/2012  . Renal cell cancer ([place])   . Hemorrhoids 11/14/2010  . IBS (irritable bowel syndrome) with chronic constipation 11/14/2010  . GERD (gastroesophageal reflux disease) 11/14/2010  . HYPERCHOLESTEROLEMIA, MILD 03/16/2010  . Lymphedema 03/15/2010  . ALLERGIC RHINITIS 08/31/2009  . MIGRAINE HEADACHE 06/14/2009  . SINUSITIS, CHRONIC 06/14/2009  . SYNCOPE 05/16/2009  . Essential hypertension 05/06/2009    Current Outpatient Medications on File Prior to Visit  Medication Sig Dispense Refill  . Accu-Chek FastClix Lancets MISC USE AS DIRECTED UP  TO  1 TIME  DAILY- DX CODE R73.03 100 each 1  . AMBULATORY NON FORMULARY MEDICATION 1 Units by Other route daily. Diabetic Shoes 1 Units 0  . amLODipine (NORVASC) 5 MG tablet Take 1 tablet (5 mg total) by mouth daily. 90 tablet 1  . atorvastatin (LIPITOR) 10 MG tablet Take 1 tablet (10 mg total) by mouth daily. 90 tablet 1  . atropine 1 % ophthalmic solution     . blood glucose meter kit and supplies KIT Dispense based on patient and insurance preference. Use up to four times daily as directed. (FOR R73.03). 1 each 0  . calcium-vitamin D (OSCAL WITH D) 500-200 MG-UNIT tablet Take 1 tablet by mouth daily.     [name]  clotrimazole (LOTRIMIN) 1 % cream clotrimazole 1 % topical cream  APPLY  CREAM TOPICALLY TO AFFECTED AREA AND  SURROUNDING  AREAS  OF  SKIN  TWICE  PER  DAY  IN  THE  MORNING  AND  EVENING    . cromolyn (OPTICROM) 4 % ophthalmic solution as needed.     . Diclofenac Sodium 2 % SOLN Apply 1 pump twice daily. (Patient taking differently: Apply 1 application topically 2 (two) times daily as needed (PAIN). Apply 1 pump twice daily.) 112 g 1  . dicyclomine (BENTYL) 10 MG capsule Take 1-2 capsules (10-20 mg total) by mouth 3 (three) times daily as needed for spasms. 150 capsule 2  . dorzolamide-timolol  (COSOPT) 22.3-6.8 MG/ML ophthalmic solution INSTILL 1 DROP INTO RIGHT EYE TWICE DAILY    . DUREZOL 0.05 % EMUL Place 1 drop into the right eye 4 (four) times daily.    Water engineer Bandages & Supports (MEDICAL COMPRESSION SOCKS) MISC Use as directed daily for lymphedema 4 each 0  . fluticasone (FLONASE) 50 MCG/ACT nasal spray Place 1 spray into both nostrils as needed. 16 g 2  . FML FORTE 0.25 % ophthalmic suspension     . folic acid (FOLVITE) 1 MG tablet Take 1 mg by mouth daily.    . furosemide (LASIX) 20 MG tablet Take 1 tablet (20 mg total) by mouth daily. 90 tablet 1  . glucose blood (ACCU-CHEK GUIDE) test strip Use to check sugars once daily. Dx Code-R73.03 100 each 1  . isoniazid (NYDRAZID) 300 MG tablet Take 1 tablet (300 mg total) by mouth daily. 30 tablet 8  . lidocaine (LIDODERM) 5 % Place 1 patch onto the skin daily. Remove & Discard patch within 12 hours or as directed by MD 90 patch 1  . LINZESS 145 MCG CAPS capsule TAKE 1 CAPSULE BY MOUTH ONCE DAILY BEFORE  BREAKFAST. 90 capsule 1  . Loteprednol Etabonate (EYSUVIS) 0.25 % SUSP Eysuvis 0.25 % eye drops,suspension    . methotrexate 2.5 MG tablet     . Multiple Vitamin (MULTIVITAMIN) tablet Take 1 tablet by mouth daily.    . Olopatadine HCl (PATADAY) 0.2 % SOLN Apply to eye.    . pantoprazole (PROTONIX) 40 MG tablet Take 1 tablet (40 mg total) by mouth daily. PHARMACY-PLEASE D/C 1 MONTH RX SENT IN ERROR 90 tablet 0  . polyethylene glycol powder (GLYCOLAX/MIRALAX) 17 GM/SCOOP powder DISSOLVE 17 GRAMS (1 CAPFUL) INTO 8 OZ WATER/JUICE AND DRINK FOR CONSTIPATION. 1530 g 0  . PROCTO-MED HC 2.5 % rectal cream APPLY  CREAM RECTALLY AT BEDTIME FOR  UP  TO  10  DAYS 30 g 0  . propranolol (INDERAL) 80 MG tablet Take 1 tablet (80 mg total) by mouth 2 (two) times daily. 180 tablet 1  . pyridOXINE (VITAMIN B-6) 50 MG tablet Take 1 tablet (50 mg total) by mouth daily. 30 tablet 8  . telmisartan (MICARDIS) 80 MG tablet Take 1 tablet (80 mg total) by  mouth daily. 90 tablet 1  . terconazole (TERAZOL 3) 0.8 % vaginal cream SMARTSIG:1 Applicator Vaginal Daily    . venlafaxine XR (EFFEXOR-XR) 75 MG 24 hr capsule TAKE 3 CAPSULES BY MOUTH ONCE DAILY WITH BREAKFAST 270 capsule 0  . zolpidem (AMBIEN) 10 MG tablet Take 1 tablet (10 mg total) by mouth at bedtime as needed for sleep. 90 tablet 0   No current facility-administered medications on file prior to visit.    Past Medical History:  Diagnosis Date  .  Allergic rhinitis, cause unspecified   . Allergy   . Anxiety   . Blood dyscrasia    platelets low in past  . Cataract   . Chronic back pain   . Chronic constipation   . Chronic sinusitis   . Depression   . Endometriosis   . ENDOMETRIOSIS 05/06/2009   Qualifier: Diagnosis of  By: Varney Daily RN, Butch Penny    . Esophageal stricture   . GERD (gastroesophageal reflux disease)   . Hemorrhoids   . Hiatal hernia   . Hyperlipidemia   . Hypertension   . Irritable bowel syndrome   . Lymphedema   . Osteoarthritis   . Personal history of diabetes mellitus    controlled with diet only  . Renal cell cancer ([place]) 11/2010 dx   R, s/p cryoablation 02/16/11  . Syncopal episodes    since childhood    Past Surgical History:  Procedure Laterality Date  . BREAST CYST EXCISION Bilateral over 10 years ago   No visable scar   . CATARACT EXTRACTION, BILATERAL Bilateral 2020  . COLONOSCOPY    . fallopian tube removed    . FUNCTIONAL ENDOSCOPIC SINUS SURGERY    . HEMORRHOID BANDING    . LASER ABLATION OF THE CERVIX    . NASAL SINUS SURGERY    . 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 TOTAL KNEE ARTHROPLASTY Right 10/30/2016   Procedure: RIGHT TOTAL KNEE ARTHROPLASTY;  Surgeon: Meredith Pel, MD;  Location: [facility];  Service: Orthopedics;  Laterality: Right;  . TUBAL LIGATION    . UMBILICAL HERNIA REPAIR      Social History   Socioeconomic History  . Marital status: Legally Separated    Spouse name: Not on file  .  Number of children: 4  . Years of education: Not on file  . Highest education level: Not on file  Occupational History  . Occupation: Disabled  Tobacco Use  . Smoking status: Never Smoker  . Smokeless tobacco: Never Used  Vaping Use  . Vaping Use: Never used  Substance and Sexual Activity  . Alcohol use: No    Comment: rare  . Drug use: No  . Sexual activity: Never  Other Topics Concern  . Not on file  Social History Narrative  . Not on file   Social Determinants of Health   Financial Resource Strain:   . Difficulty of Paying Living Expenses: Not on file  Food Insecurity:   . Worried About Charity fundraiser in the Last Year: Not on file  . Ran Out of Food in the Last Year: Not on file  Transportation Needs:   . Lack of Transportation (Medical): Not on file  . Lack of Transportation (Non-Medical): Not on file  Physical Activity:   . Days of Exercise per Week: Not on file  . Minutes of Exercise per Session: Not on file  Stress:   . Feeling of Stress : Not on file  Social Connections:   . Frequency of Communication with Friends and Family: Not on file  . Frequency of Social Gatherings with Friends and Family: Not on file  . Attends Religious Services: Not on file  . Active Member of Clubs or Organizations: Not on file  . Attends Archivist Meetings: Not on file  . Marital Status: Not on file    Family History  Problem Relation Age of Onset  . Diabetes Mother   . Hypertension Mother   .  Hyperlipidemia Mother   . Heart disease Mother   . Stroke Mother   . Kidney disease Mother   . Thyroid disease Mother   . Sleep apnea Mother   . Obesity Mother   . Kidney disease Father   . Prostate cancer Father   . Alcoholism Father   . Alcohol abuse Father   . Colitis Brother   . Alcohol abuse Brother   . Leukemia Brother   . Multiple sclerosis Sister   . Colon polyps Other   . Breast cancer Maternal Aunt   . Breast cancer Cousin   . Alcohol abuse Daughter     . Colon cancer Neg Hx   . Esophageal cancer Neg Hx   . Pancreatic cancer Neg Hx   . Rectal cancer Neg Hx   . Stomach cancer Neg Hx     Review of Systems  Constitutional: Negative for fever.  Respiratory: Negative for shortness of breath.   Cardiovascular: Negative for chest pain, palpitations and leg swelling.  Gastrointestinal: Positive for abdominal pain and constipation.  Musculoskeletal: Positive for arthralgias, back pain and neck pain.  Neurological: Positive for headaches.  Psychiatric/Behavioral: Positive for decreased concentration and dysphoric mood. The patient is nervous/anxious.        Memory concerns       Objective:   Vitals:   04/06/20 1315  BP: 132/70  Pulse: 63  Temp: 98.3 F (36.8 C)  SpO2: 97%   BP Readings from Last 3 Encounters:  04/06/20 132/70  11/25/19 133/85  11/06/19 122/78   Wt Readings from Last 3 Encounters:  04/06/20 251 lb (113.9 kg)  11/25/19 245 lb 12.8 oz (111.5 kg)  11/06/19 242 lb (109.8 kg)   Body mass index is 45.91 kg/m.   Physical Exam    Constitutional: Appears well-developed and well-nourished. No distress.  Head: Normocephalic and atraumatic.  Neck: Neck supple. No tracheal deviation present. No thyromegaly present.  No cervical lymphadenopathy Cardiovascular: Normal rate, regular rhythm and normal heart sounds.  No murmur heard. No carotid bruit .  No edema Pulmonary/Chest: Effort normal and breath sounds normal. No respiratory distress. No has no wheezes. No rales.  Skin: Skin is warm and dry. Not diaphoretic.  Psychiatric: Normal mood and affect. Behavior is normal.       Assessment & Plan:    See Problem List for Assessment and Plan of chronic medical problems.    This visit occurred during the SARS-CoV-2 public health emergency.  Safety protocols were in place, including screening questions prior to the visit, additional usage of staff PPE, and extensive cleaning of exam room while observing appropriate  contact time as indicated for disinfecting solutions.

## 2020-04-06 ENCOUNTER — Ambulatory Visit (INDEPENDENT_AMBULATORY_CARE_PROVIDER_SITE_OTHER): Payer: Medicare Other | Admitting: Internal Medicine

## 2020-04-06 ENCOUNTER — Other Ambulatory Visit: Payer: Self-pay

## 2020-04-06 DIAGNOSIS — M1710 Unilateral primary osteoarthritis, unspecified knee: Secondary | ICD-10-CM | POA: Diagnosis not present

## 2020-04-06 DIAGNOSIS — M549 Dorsalgia, unspecified: Secondary | ICD-10-CM

## 2020-04-06 DIAGNOSIS — M542 Cervicalgia: Secondary | ICD-10-CM

## 2020-04-06 DIAGNOSIS — F32A Depression, unspecified: Secondary | ICD-10-CM

## 2020-04-06 DIAGNOSIS — I10 Essential (primary) hypertension: Secondary | ICD-10-CM

## 2020-04-06 DIAGNOSIS — F419 Anxiety disorder, unspecified: Secondary | ICD-10-CM

## 2020-04-06 DIAGNOSIS — G8929 Other chronic pain: Secondary | ICD-10-CM

## 2020-04-06 DIAGNOSIS — Z23 Encounter for immunization: Secondary | ICD-10-CM

## 2020-04-06 DIAGNOSIS — K581 Irritable bowel syndrome with constipation: Secondary | ICD-10-CM

## 2020-04-06 NOTE — Assessment & Plan Note (Signed)
Chronic BP well controlled Continue amlodipine 5 mg daily, Lasix 20 mg daily, propranolol 80 mg twice daily and telmisartan 80 mg daily

## 2020-04-06 NOTE — Assessment & Plan Note (Signed)
Chronic bilateral knee arthritis Using a cane or walker and I did advise she needs to use one of these in the house and she is falling in the house Will refer to orthopedics-she think she needs another injection

## 2020-04-06 NOTE — Assessment & Plan Note (Addendum)
Chronic Not currently controlled She is following with psychiatry and has an upcoming appointment and she will discuss with him Discussed with her that her anxiety and depression may be affecting her memory as well

## 2020-04-06 NOTE — Assessment & Plan Note (Signed)
Chronic She is complaining of some right-sided abdominal pain with eating, which is likely related to her IBS Also has chronic constipation Taking Linzess alternating with MiraLAX She is not sure if she is taking the Bentyl daily or just as needed-advised that this is the medication she should be taking for this pain as needed to see if that helps Also advised for her to keep better track of her symptoms because she is unsure when this occurs and how often it occurs

## 2020-04-06 NOTE — Assessment & Plan Note (Signed)
Chronic Has seen Dr. Herma Mering in the past and he advised PT, which she did not do secondary to cost Advised following up with him again to see if physical therapy is still the best treatment-referral ordered

## 2020-04-06 NOTE — Assessment & Plan Note (Signed)
Chronic Has seen Dr. Herma Mering in the past-he advised physical therapy, but she was not able to afford that at that time Discussed that her back pain may be arthritic in nature Advised to follow-up with Dr. Herma Mering

## 2020-04-06 NOTE — Assessment & Plan Note (Signed)
Chronic BMI 45.9 With multiple comorbidities She is interested in bariatric surgery-I will refer her.  Briefly discussed some of the things required for bariatric surgery and that this is not a quick fix she needs to make lifestyle changes

## 2020-04-06 NOTE — Patient Instructions (Addendum)
Flu immunization administered today.      Medications reviewed and updated.  Changes include :   none   A referral was ordered for Dr Nelva Bush and Bariatric surgery.     Someone from their office will call you to schedule an appointment.

## 2020-04-07 ENCOUNTER — Other Ambulatory Visit: Payer: Self-pay

## 2020-04-07 MED ORDER — ACCU-CHEK FASTCLIX LANCETS MISC: 1 refills | Status: DC

## 2020-04-08 ENCOUNTER — Encounter: Payer: Self-pay | Admitting: Internal Medicine

## 2020-04-13 ENCOUNTER — Ambulatory Visit: Payer: Medicare Other | Admitting: Internal Medicine

## 2020-04-20 ENCOUNTER — Other Ambulatory Visit: Payer: Self-pay

## 2020-04-20 ENCOUNTER — Telehealth (HOSPITAL_COMMUNITY): Payer: Medicare Other | Admitting: Psychiatry

## 2020-04-20 NOTE — Progress Notes (Signed)
Attempted to call patient few times - no answer and mailbox is full. She has last been seen on March 17, 2019. She was a no show for a follow up appointment on April 29, 2019 and has not rescheduled until today's appointment which she again missed. I will not continue to prescribe her medications and at this time she will be discharged from practice.

## 2020-05-04 ENCOUNTER — Telehealth: Payer: Self-pay | Admitting: Internal Medicine

## 2020-05-04 NOTE — Telephone Encounter (Signed)
LVM for pt to rtn my call to schedule AWV-I with [facility]. Please schedule this appt if pt calls the office.  ?

## 2020-05-10 ENCOUNTER — Ambulatory Visit: Payer: Medicare Other | Admitting: Internal Medicine

## 2020-05-24 NOTE — Progress Notes (Deleted)
NEUROLOGY FOLLOW UP OFFICE NOTE  Janet Le [id number]   Subjective:  Janet Le is a 64 year old right-handed woman with history of renal cell carcinoma, hypertension, diabetes, migraine, IBS, lymphedema, osteoarthritis and osteoporosis who follows up for neuropathy.  UPDATE: Current medications:  Venlafaxine XR 213m, propranolol 860mBID, amlodipine, B6  HISTORY: NEUROPATHY: In 2016, she started experiencing burning pain from the bottom of her feet up the back of her legs to the knees. It is intermittent and lasts for various and unknown period of time, but not aware of any triggers such as due to position. In addition, she is reporting increased back pain and spasms, worse when sitting and improved when laying supine. She has a history of chronic low back pain with left lumbar radiculopathy, as well as right knee pain. She has been seen and evaluated by orthopedics and pain management. MRI of lumbar spine from 07/27/16 revealed advanced L4-5 and L5-S1 facet arthrosis with some bilateral neural foraminal stenosis. Dr. DeMarlou Saf orthopedics did not think surgery was an option at that time. TSH from 10/15/16 was 1.67. B12 from 10/18/16 was 537. Hgb A1c from 04/05/17 was 5.6. NCV-EMG from 10/25/16 demonstrated no evidence of large fiber sensorimotor polyneuropathy or lumbosacral radiculopathy.   Other History: VISUAL DISTURBANCE: Symptoms started in 2014. She describes initially seeing a line of light along the bottom of her visual field in the left eye. She then developed what looks like bubbles floating up in the temporal aspect of the visual fields of in both eyes. It lasts only a few seconds and occurs several times a day. There is no vision loss. There is no associated headache. There was a concern for diabetic retinopathy. She saw Dr. GrKaty Fitchan ophthalmologist on 11/02/13. The exam did not reveal diabetic retinopathy, but a cardiac evaluation was recommended.  Possibly ocular migraines.  DIZZINESS: Also in 2014, she began feeling dizzy. She describes it as a feeling of lightheadedness, as if she is going to pass out. There is no spinning sensation. She sometimes hears "static" during these spells. She has been found to have low blood pressure and is orthostatic. She also began feeling diffuse pain in her joints. Uric acid level was 11.4. HCTZ was discontinued nd she was placed on colchicine.   HAND TREMORS:   Since early 2019, she notes that her hands will start moving spontaneously. It occurs when she is using her hands in particular position. For example, if she is holding her cell phone with her index finger on the screen, her index finger will start to tremor or even start tapping. She notices that her hand will shake a bit if she is holding a utensil. It does not occur when her hands are completely at rest, such as laying flat on her lap. 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    MEMORY DEFICITS: Started in 2018.  She reports misplacing objects or forgetting things she said.  Sometimes when she is cooking, she may do something that wasn't required by the recipe.  She reports anxiety while driving but no disorientation on familiar routes.  She has forgotten to pay bills but then remembers and is able to pay within the grace period.  She sometimes has trouble recalling her grandchildren's names for a moment.  She does have depression and anxiety.  Labs from May 2019 included normal B12 528 and TSH 1.37.  Her mother had  Alzheimer's.   She also reports tremors or abnormal movements involving all of her body.  She underwent neuropsychological testing 07/09/18.  She demonstrated probable deficits of processing speed, executive function, visual memory significantly more than verbal memory, and Ecologist.  It was noted that such a profile may  be seen in early Parkinson's disease or a Parkinson's plus syndrome.  However, her performance may have been skewed due to observed anxiety and mildly suboptimal task persistence.  It was noted that her symptoms may conceivably be somatoform and may be due to premorbid ADHD, emotional stress and physical disability.  Regardless, she did not meet criteria for any type of dementia.  In my opinion, I believe that her cognitive deficits are likely related to psychological factors.  I have examined her on multiple occasions and she does not exhibit any symptoms of parkinsonism on exam.   MRI of brain without contrast from 12/19/13 demonstrated incidental small 11 mm dural calcification but nothing concerning.  PAST MEDICAL HISTORY: Past Medical History:  Diagnosis Date  . Allergic rhinitis, cause unspecified   . Allergy   . Anxiety   . Blood dyscrasia    platelets low in past  . Cataract   . Chronic back pain   . Chronic constipation   . Chronic sinusitis   . Depression   . Endometriosis   . ENDOMETRIOSIS 05/06/2009   Qualifier: Diagnosis of  By: Varney Daily RN, Butch Penny    . Esophageal stricture   . GERD (gastroesophageal reflux disease)   . Hemorrhoids   . Hiatal hernia   . Hyperlipidemia   . Hypertension   . Irritable bowel syndrome   . Lymphedema   . Osteoarthritis   . Personal history of diabetes mellitus    controlled with diet only  . Renal cell cancer ([facility]) 11/2010 dx   R, s/p cryoablation 02/16/11  . Syncopal episodes    since childhood    MEDICATIONS: Current Outpatient Medications on File Prior to Visit  Medication Sig Dispense Refill  . Accu-Chek FastClix Lancets MISC USE AS DIRECTED UP  TO  1 TIME  DAILY- DX CODE R73.03 100 each 1  . AMBULATORY NON FORMULARY MEDICATION 1 Units by Other route daily. Diabetic Shoes 1 Units 0  . amLODipine (NORVASC) 5 MG tablet Take 1 tablet (5 mg total) by mouth daily. 90 tablet 1  . atorvastatin (LIPITOR) 10 MG tablet Take 1 tablet (10 mg  total) by mouth daily. 90 tablet 1  . atropine 1 % ophthalmic solution     . blood glucose meter kit and supplies KIT Dispense based on patient and insurance preference. Use up to four times daily as directed. (FOR R73.03). 1 each 0  . calcium-vitamin D (OSCAL WITH D) 500-200 MG-UNIT tablet Take 1 tablet by mouth daily.     . clotrimazole (LOTRIMIN) 1 % cream clotrimazole 1 % topical cream  APPLY  CREAM TOPICALLY TO AFFECTED AREA AND  SURROUNDING  AREAS  OF  SKIN  TWICE  PER  DAY  IN  THE  MORNING  AND  EVENING    . cromolyn (OPTICROM) 4 % ophthalmic solution as needed.     . Diclofenac Sodium 2 % SOLN Apply 1 pump twice daily. (Patient taking differently: Apply 1 application topically 2 (two) times daily as needed (PAIN). Apply 1 pump twice daily.) 112 g 1  . dicyclomine (BENTYL) 10 MG capsule Take 1-2 capsules (10-20 mg total) by mouth 3 (three) times daily as needed for spasms. [place]  capsule 2  . dorzolamide-timolol (COSOPT) 22.3-6.8 MG/ML ophthalmic solution INSTILL 1 DROP INTO RIGHT EYE TWICE DAILY    . DUREZOL 0.05 % EMUL Place 1 drop into the right eye 4 (four) times daily.    Water engineer Bandages & Supports (MEDICAL COMPRESSION SOCKS) MISC Use as directed daily for lymphedema 4 each 0  . fluticasone (FLONASE) 50 MCG/ACT nasal spray Place 1 spray into both nostrils as needed. 16 g 2  . FML FORTE 0.25 % ophthalmic suspension     . folic acid (FOLVITE) 1 MG tablet Take 1 mg by mouth daily.    . furosemide (LASIX) 20 MG tablet Take 1 tablet (20 mg total) by mouth daily. 90 tablet 1  . glucose blood (ACCU-CHEK GUIDE) test strip Use to check sugars once daily. Dx Code-R73.03 100 each 1  . isoniazid (NYDRAZID) 300 MG tablet Take 1 tablet (300 mg total) by mouth daily. 30 tablet 8  . lidocaine (LIDODERM) 5 % Place 1 patch onto the skin daily. Remove & Discard patch within 12 hours or as directed by MD 90 patch 1  . LINZESS 145 MCG CAPS capsule TAKE 1 CAPSULE BY MOUTH ONCE DAILY BEFORE  BREAKFAST. 90  capsule 1  . Loteprednol Etabonate (EYSUVIS) 0.25 % SUSP Eysuvis 0.25 % eye drops,suspension    . methotrexate 2.5 MG tablet     . Multiple Vitamin (MULTIVITAMIN) tablet Take 1 tablet by mouth daily.    . Olopatadine HCl (PATADAY) 0.2 % SOLN Apply to eye.    . pantoprazole (PROTONIX) 40 MG tablet Take 1 tablet (40 mg total) by mouth daily. PHARMACY-PLEASE D/C 1 MONTH RX SENT IN ERROR 90 tablet 0  . polyethylene glycol powder (GLYCOLAX/MIRALAX) 17 GM/SCOOP powder DISSOLVE 17 GRAMS (1 CAPFUL) INTO 8 OZ WATER/JUICE AND DRINK FOR CONSTIPATION. 1530 g 0  . PROCTO-MED HC 2.5 % rectal cream APPLY  CREAM RECTALLY AT BEDTIME FOR  UP  TO  10  DAYS 30 g 0  . propranolol (INDERAL) 80 MG tablet Take 1 tablet (80 mg total) by mouth 2 (two) times daily. 180 tablet 1  . pyridOXINE (VITAMIN B-6) 50 MG tablet Take 1 tablet (50 mg total) by mouth daily. 30 tablet 8  . telmisartan (MICARDIS) 80 MG tablet Take 1 tablet (80 mg total) by mouth daily. 90 tablet 1  . terconazole (TERAZOL 3) 0.8 % vaginal cream SMARTSIG:1 Applicator Vaginal Daily    . venlafaxine XR (EFFEXOR-XR) 75 MG 24 hr capsule TAKE 3 CAPSULES BY MOUTH ONCE DAILY WITH BREAKFAST 270 capsule 0  . zolpidem (AMBIEN) 10 MG tablet Take 1 tablet (10 mg total) by mouth at bedtime as needed for sleep. 90 tablet 0   No current facility-administered medications on file prior to visit.    ALLERGIES: Allergies  Allergen Reactions  . Sertraline Hcl Other (See Comments)    Spasms; numbness  . Ace Inhibitors Cough  . Codeine Palpitations  . Darifenacin Hydrobromide Er Other (See Comments)    Pt states made her feel queezy & drunk  . Gabapentin Other (See Comments)    Hallucinations  . Pravastatin Other (See Comments)    myalgias  . Propoxyphene Hcl Palpitations  . Wellbutrin [Bupropion Hcl] Other (See Comments)    Numbness of mouth/lips    FAMILY HISTORY: Family History  Problem Relation Age of Onset  . Diabetes Mother   . Hypertension Mother   .  Hyperlipidemia Mother   . Heart disease Mother   . Stroke Mother   .  Kidney disease Mother   . Thyroid disease Mother   . Sleep apnea Mother   . Obesity Mother   . Kidney disease Father   . Prostate cancer Father   . Alcoholism Father   . Alcohol abuse Father   . Colitis Brother   . Alcohol abuse Brother   . Leukemia Brother   . Multiple sclerosis Sister   . Colon polyps Other   . Breast cancer Maternal Aunt   . Breast cancer Cousin   . Alcohol abuse Daughter   . Colon cancer Neg Hx   . Esophageal cancer Neg Hx   . Pancreatic cancer Neg Hx   . Rectal cancer Neg Hx   . Stomach cancer Neg Hx    ***.  SOCIAL HISTORY: Social History   Socioeconomic History  . Marital status: Legally Separated    Spouse name: Not on file  . Number of children: 4  . Years of education: Not on file  . Highest education level: Not on file  Occupational History  . Occupation: Disabled  Tobacco Use  . Smoking status: Never Smoker  . Smokeless tobacco: Never Used  Vaping Use  . Vaping Use: Never used  Substance and Sexual Activity  . Alcohol use: No    Comment: rare  . Drug use: No  . Sexual activity: Never  Other Topics Concern  . Not on file  Social History Narrative  . Not on file   Social Determinants of Health   Financial Resource Strain:   . Difficulty of Paying Living Expenses: Not on file  Food Insecurity:   . Worried About Charity fundraiser in the Last Year: Not on file  . Ran Out of Food in the Last Year: Not on file  Transportation Needs:   . Lack of Transportation (Medical): Not on file  . Lack of Transportation (Non-Medical): Not on file  Physical Activity:   . Days of Exercise per Week: Not on file  . Minutes of Exercise per Session: Not on file  Stress:   . Feeling of Stress : Not on file  Social Connections:   . Frequency of Communication with Friends and Family: Not on file  . Frequency of Social Gatherings with Friends and Family: Not on file  . Attends  Religious Services: Not on file  . Active Member of Clubs or Organizations: Not on file  . Attends Archivist Meetings: Not on file  . Marital Status: Not on file  Intimate Partner Violence:   . Fear of Current or Ex-Partner: Not on file  . Emotionally Abused: Not on file  . Physically Abused: Not on file  . Sexually Abused: Not on file     Objective:  *** General: No acute distress.  Patient appears ***-groomed.   Head:  Normocephalic/atraumatic Eyes:  Fundi examined but not visualized Neck: supple, no paraspinal tenderness, full range of motion Heart:  Regular rate and rhythm Lungs:  Clear to auscultation bilaterally Back: No paraspinal tenderness Neurological Exam: alert and oriented to person, place, and time. Attention span and concentration intact, recent and remote memory intact, fund of knowledge intact.  Speech fluent and not dysarthric, language intact.  CN II-XII intact. Bulk and tone normal, muscle strength 5/5 throughout.  Sensation to light touch, temperature and vibration intact.  Deep tendon reflexes 2+ throughout, toes downgoing.  Finger to nose and heel to shin testing intact.  Gait normal, Romberg negative.   Assessment/Plan:   1.  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 2.  Memory deficits.  *** 3.  Tremor - psychogenic 4.  Depression and anxiety  ***  Metta Clines, DO  CC: ***

## 2020-05-25 NOTE — Progress Notes (Deleted)
NEUROLOGY FOLLOW UP OFFICE NOTE  Janet RUNDE [id number]   Subjective:  Janet Le is a 64 year old right-handed woman with history of renal cell carcinoma, hypertension, diabetes, migraine, IBS, lymphedema, osteoarthritis and osteoporosis who follows up for neuropathy.  UPDATE: Current medications:  Venlafaxine XR 225m, propranolol 85mBID, amlodipine, B6  HISTORY: NEUROPATHY: In 2016, she started experiencing burning pain from the bottom of her feet up the back of her legs to the knees. It is intermittent and lasts for various and unknown period of time, but not aware of any triggers such as due to position. In addition, she is reporting increased back pain and spasms, worse when sitting and improved when laying supine. She has a history of chronic low back pain with left lumbar radiculopathy, as well as right knee pain. She has been seen and evaluated by orthopedics and pain management. MRI of lumbar spine from 07/27/16 revealed advanced L4-5 and L5-S1 facet arthrosis with some bilateral neural foraminal stenosis. Dr. DeMarlou Saf orthopedics did not think surgery was an option at that time. TSH from 10/15/16 was 1.67. B12 from 10/18/16 was 537. Hgb A1c from 04/05/17 was 5.6. NCV-EMG from 10/25/16 demonstrated no evidence of large fiber sensorimotor polyneuropathy or lumbosacral radiculopathy.   Other History: VISUAL DISTURBANCE: Symptoms started in 2014. She describes initially seeing a line of light along the bottom of her visual field in the left eye. She then developed what looks like bubbles floating up in the temporal aspect of the visual fields of in both eyes. It lasts only a few seconds and occurs several times a day. There is no vision loss. There is no associated headache. There was a concern for diabetic retinopathy. She saw Dr. GrKaty Fitchan ophthalmologist on 11/02/13. The exam did not reveal diabetic retinopathy, but a cardiac evaluation was recommended.  Possibly ocular migraines.  DIZZINESS: Also in 2014, she began feeling dizzy. She describes it as a feeling of lightheadedness, as if she is going to pass out. There is no spinning sensation. She sometimes hears "static" during these spells. She has been found to have low blood pressure and is orthostatic. She also began feeling diffuse pain in her joints. Uric acid level was 11.4. HCTZ was discontinued nd she was placed on colchicine.   HAND TREMORS:   Since early 2019, she notes that her hands will start moving spontaneously. It occurs when she is using her hands in particular position. For example, if she is holding her cell phone with her index finger on the screen, her index finger will start to tremor or even start tapping. She notices that her hand will shake a bit if she is holding a utensil. It does not occur when her hands are completely at rest, such as laying flat on her lap. 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    MEMORY DEFICITS: Started in 2018.  She reports misplacing objects or forgetting things she said.  Sometimes when she is cooking, she may do something that wasn't required by the recipe.  She reports anxiety while driving but no disorientation on familiar routes.  She has forgotten to pay bills but then remembers and is able to pay within the grace period.  She sometimes has trouble recalling her grandchildren's names for a moment.  She does have depression and anxiety.  Labs from May 2019 included normal B12 528 and TSH 1.37.  Her mother had  Alzheimer's.   She also reports tremors or abnormal movements involving all of her body.  She underwent neuropsychological testing 07/09/18.  She demonstrated probable deficits of processing speed, executive function, visual memory significantly more than verbal memory, and Ecologist.  It was noted that such a profile may  be seen in early Parkinson's disease or a Parkinson's plus syndrome.  However, her performance may have been skewed due to observed anxiety and mildly suboptimal task persistence.  It was noted that her symptoms may conceivably be somatoform and may be due to premorbid ADHD, emotional stress and physical disability.  Regardless, she did not meet criteria for any type of dementia.  In my opinion, I believe that her cognitive deficits are likely related to psychological factors.  I have examined her on multiple occasions and she does not exhibit any symptoms of parkinsonism on exam.   MRI of brain without contrast from 12/19/13 demonstrated incidental small 11 mm dural calcification but nothing concerning.  PAST MEDICAL HISTORY: Past Medical History:  Diagnosis Date  . Allergic rhinitis, cause unspecified   . Allergy   . Anxiety   . Blood dyscrasia    platelets low in past  . Cataract   . Chronic back pain   . Chronic constipation   . Chronic sinusitis   . Depression   . Endometriosis   . ENDOMETRIOSIS 05/06/2009   Qualifier: Diagnosis of  By: Varney Daily RN, Butch Penny    . Esophageal stricture   . GERD (gastroesophageal reflux disease)   . Hemorrhoids   . Hiatal hernia   . Hyperlipidemia   . Hypertension   . Irritable bowel syndrome   . Lymphedema   . Osteoarthritis   . Personal history of diabetes mellitus    controlled with diet only  . Renal cell cancer ([place]) 11/2010 dx   R, s/p cryoablation 02/16/11  . Syncopal episodes    since childhood    MEDICATIONS: Current Outpatient Medications on File Prior to Visit  Medication Sig Dispense Refill  . Accu-Chek FastClix Lancets MISC USE AS DIRECTED UP  TO  1 TIME  DAILY- DX CODE R73.03 100 each 1  . AMBULATORY NON FORMULARY MEDICATION 1 Units by Other route daily. Diabetic Shoes 1 Units 0  . amLODipine (NORVASC) 5 MG tablet Take 1 tablet (5 mg total) by mouth daily. 90 tablet 1  . atorvastatin (LIPITOR) 10 MG tablet Take 1 tablet (10 mg  total) by mouth daily. 90 tablet 1  . atropine 1 % ophthalmic solution     . blood glucose meter kit and supplies KIT Dispense based on patient and insurance preference. Use up to four times daily as directed. (FOR R73.03). 1 each 0  . calcium-vitamin D (OSCAL WITH D) 500-200 MG-UNIT tablet Take 1 tablet by mouth daily.     . clotrimazole (LOTRIMIN) 1 % cream clotrimazole 1 % topical cream  APPLY  CREAM TOPICALLY TO AFFECTED AREA AND  SURROUNDING  AREAS  OF  SKIN  TWICE  PER  DAY  IN  THE  MORNING  AND  EVENING    . cromolyn (OPTICROM) 4 % ophthalmic solution as needed.     . Diclofenac Sodium 2 % SOLN Apply 1 pump twice daily. (Patient taking differently: Apply 1 application topically 2 (two) times daily as needed (PAIN). Apply 1 pump twice daily.) 112 g 1  . dicyclomine (BENTYL) 10 MG capsule Take 1-2 capsules (10-20 mg total) by mouth 3 (three) times daily as needed for spasms. Atlas  capsule 2  . dorzolamide-timolol (COSOPT) 22.3-6.8 MG/ML ophthalmic solution INSTILL 1 DROP INTO RIGHT EYE TWICE DAILY    . DUREZOL 0.05 % EMUL Place 1 drop into the right eye 4 (four) times daily.    Water engineer Bandages & Supports (MEDICAL COMPRESSION SOCKS) MISC Use as directed daily for lymphedema 4 each 0  . fluticasone (FLONASE) 50 MCG/ACT nasal spray Place 1 spray into both nostrils as needed. 16 g 2  . FML FORTE 0.25 % ophthalmic suspension     . folic acid (FOLVITE) 1 MG tablet Take 1 mg by mouth daily.    . furosemide (LASIX) 20 MG tablet Take 1 tablet (20 mg total) by mouth daily. 90 tablet 1  . glucose blood (ACCU-CHEK GUIDE) test strip Use to check sugars once daily. Dx Code-R73.03 100 each 1  . isoniazid (NYDRAZID) 300 MG tablet Take 1 tablet (300 mg total) by mouth daily. 30 tablet 8  . lidocaine (LIDODERM) 5 % Place 1 patch onto the skin daily. Remove & Discard patch within 12 hours or as directed by MD 90 patch 1  . LINZESS 145 MCG CAPS capsule TAKE 1 CAPSULE BY MOUTH ONCE DAILY BEFORE  BREAKFAST. 90  capsule 1  . Loteprednol Etabonate (EYSUVIS) 0.25 % SUSP Eysuvis 0.25 % eye drops,suspension    . methotrexate 2.5 MG tablet     . Multiple Vitamin (MULTIVITAMIN) tablet Take 1 tablet by mouth daily.    . Olopatadine HCl (PATADAY) 0.2 % SOLN Apply to eye.    . pantoprazole (PROTONIX) 40 MG tablet Take 1 tablet (40 mg total) by mouth daily. PHARMACY-PLEASE D/C 1 MONTH RX SENT IN ERROR 90 tablet 0  . polyethylene glycol powder (GLYCOLAX/MIRALAX) 17 GM/SCOOP powder DISSOLVE 17 GRAMS (1 CAPFUL) INTO 8 OZ WATER/JUICE AND DRINK FOR CONSTIPATION. 1530 g 0  . PROCTO-MED HC 2.5 % rectal cream APPLY  CREAM RECTALLY AT BEDTIME FOR  UP  TO  10  DAYS 30 g 0  . propranolol (INDERAL) 80 MG tablet Take 1 tablet (80 mg total) by mouth 2 (two) times daily. 180 tablet 1  . pyridOXINE (VITAMIN B-6) 50 MG tablet Take 1 tablet (50 mg total) by mouth daily. 30 tablet 8  . telmisartan (MICARDIS) 80 MG tablet Take 1 tablet (80 mg total) by mouth daily. 90 tablet 1  . terconazole (TERAZOL 3) 0.8 % vaginal cream SMARTSIG:1 Applicator Vaginal Daily    . venlafaxine XR (EFFEXOR-XR) 75 MG 24 hr capsule TAKE 3 CAPSULES BY MOUTH ONCE DAILY WITH BREAKFAST 270 capsule 0  . zolpidem (AMBIEN) 10 MG tablet Take 1 tablet (10 mg total) by mouth at bedtime as needed for sleep. 90 tablet 0   No current facility-administered medications on file prior to visit.    ALLERGIES: Allergies  Allergen Reactions  . Sertraline Hcl Other (See Comments)    Spasms; numbness  . Ace Inhibitors Cough  . Codeine Palpitations  . Darifenacin Hydrobromide Er Other (See Comments)    Pt states made her feel queezy & drunk  . Gabapentin Other (See Comments)    Hallucinations  . Pravastatin Other (See Comments)    myalgias  . Propoxyphene Hcl Palpitations  . Wellbutrin [Bupropion Hcl] Other (See Comments)    Numbness of mouth/lips    FAMILY HISTORY: Family History  Problem Relation Age of Onset  . Diabetes Mother   . Hypertension Mother   .  Hyperlipidemia Mother   . Heart disease Mother   . Stroke Mother   .  Kidney disease Mother   . Thyroid disease Mother   . Sleep apnea Mother   . Obesity Mother   . Kidney disease Father   . Prostate cancer Father   . Alcoholism Father   . Alcohol abuse Father   . Colitis Brother   . Alcohol abuse Brother   . Leukemia Brother   . Multiple sclerosis Sister   . Colon polyps Other   . Breast cancer Maternal Aunt   . Breast cancer Cousin   . Alcohol abuse Daughter   . Colon cancer Neg Hx   . Esophageal cancer Neg Hx   . Pancreatic cancer Neg Hx   . Rectal cancer Neg Hx   . Stomach cancer Neg Hx    SOCIAL HISTORY: Social History   Socioeconomic History  . Marital status: Legally Separated    Spouse name: Not on file  . Number of children: 4  . Years of education: Not on file  . Highest education level: Not on file  Occupational History  . Occupation: Disabled  Tobacco Use  . Smoking status: Never Smoker  . Smokeless tobacco: Never Used  Vaping Use  . Vaping Use: Never used  Substance and Sexual Activity  . Alcohol use: No    Comment: rare  . Drug use: No  . Sexual activity: Never  Other Topics Concern  . Not on file  Social History Narrative  . Not on file   Social Determinants of Health   Financial Resource Strain:   . Difficulty of Paying Living Expenses: Not on file  Food Insecurity:   . Worried About Charity fundraiser in the Last Year: Not on file  . Ran Out of Food in the Last Year: Not on file  Transportation Needs:   . Lack of Transportation (Medical): Not on file  . Lack of Transportation (Non-Medical): Not on file  Physical Activity:   . Days of Exercise per Week: Not on file  . Minutes of Exercise per Session: Not on file  Stress:   . Feeling of Stress : Not on file  Social Connections:   . Frequency of Communication with Friends and Family: Not on file  . Frequency of Social Gatherings with Friends and Family: Not on file  . Attends  Religious Services: Not on file  . Active Member of Clubs or Organizations: Not on file  . Attends Archivist Meetings: Not on file  . Marital Status: Not on file  Intimate Partner Violence:   . Fear of Current or Ex-Partner: Not on file  . Emotionally Abused: Not on file  . Physically Abused: Not on file  . Sexually Abused: Not on file     Objective:  *** General: No acute distress.  Patient appears ***-groomed.   Head:  Normocephalic/atraumatic Eyes:  Fundi examined but not visualized Neck: supple, no paraspinal tenderness, full range of motion Heart:  Regular rate and rhythm Lungs:  Clear to auscultation bilaterally Back: No paraspinal tenderness Neurological Exam: alert and oriented to person, place, and time. Attention span and concentration intact, recent and remote memory intact, fund of knowledge intact.  Speech fluent and not dysarthric, language intact.  CN II-XII intact. Bulk and tone normal, muscle strength 5/5 throughout.  Sensation to light touch, temperature and vibration intact.  Deep tendon reflexes 2+ throughout, toes downgoing.  Finger to nose and heel to shin testing intact.  Gait normal, Romberg negative.   Assessment/Plan:   1.  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 2.  Memory deficits.  *** 3.  Tremor - psychogenic 4.  Depression and anxiety  ***  Metta Clines, DO  CC: ***

## 2020-05-26 ENCOUNTER — Ambulatory Visit: Payer: Medicare Other | Admitting: Neurology

## 2020-05-27 ENCOUNTER — Ambulatory Visit: Payer: Medicare Other | Admitting: Neurology

## 2020-05-31 ENCOUNTER — Other Ambulatory Visit (HOSPITAL_COMMUNITY): Payer: Self-pay | Admitting: Psychiatry

## 2020-05-31 ENCOUNTER — Other Ambulatory Visit: Payer: Self-pay | Admitting: Internal Medicine

## 2020-05-31 ENCOUNTER — Telehealth: Payer: Self-pay | Admitting: Internal Medicine

## 2020-05-31 DIAGNOSIS — K2970 Gastritis, unspecified, without bleeding: Secondary | ICD-10-CM

## 2020-06-01 ENCOUNTER — Other Ambulatory Visit: Payer: Self-pay

## 2020-06-01 MED ORDER — FUROSEMIDE 20 MG PO TABS
20.0000 mg | ORAL_TABLET | Freq: Every day | ORAL | 1 refills | Status: DC
Start: 2020-06-01 — End: 2020-06-16

## 2020-06-01 NOTE — Telephone Encounter (Signed)
    Patient calling for status of Lasix, she has no medication reamining

## 2020-06-01 NOTE — Telephone Encounter (Signed)
Faxed in for patient today. 

## 2020-06-02 ENCOUNTER — Other Ambulatory Visit: Payer: Self-pay

## 2020-06-02 ENCOUNTER — Telehealth: Payer: Self-pay

## 2020-06-02 ENCOUNTER — Telehealth (INDEPENDENT_AMBULATORY_CARE_PROVIDER_SITE_OTHER): Payer: Medicare Other | Admitting: Psychiatry

## 2020-06-02 DIAGNOSIS — F909 Attention-deficit hyperactivity disorder, unspecified type: Secondary | ICD-10-CM | POA: Diagnosis not present

## 2020-06-02 DIAGNOSIS — F341 Dysthymic disorder: Secondary | ICD-10-CM

## 2020-06-02 MED ORDER — AMPHETAMINE-DEXTROAMPHET ER 20 MG PO CP24: 20.0000 mg | ORAL_CAPSULE | Freq: Every day | ORAL | 0 refills | Status: DC

## 2020-06-02 MED ORDER — ZOLPIDEM TARTRATE 10 MG PO TABS: 10.0000 mg | ORAL_TABLET | Freq: Every evening | ORAL | 0 refills | Status: DC | PRN

## 2020-06-02 MED ORDER — DULOXETINE HCL 30 MG PO CPEP: ORAL_CAPSULE | ORAL | 0 refills | Status: DC
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
## 2020-06-02 NOTE — Progress Notes (Signed)
NEUROLOGY FOLLOW UP OFFICE NOTE  Janet Le [id number]   Subjective:  Janet Le is a 64 year old right-handed woman with hypertension, diabetes, TB, IBS, lymphedema, osteoarthritis and osteoporosis and history of renal cell carcinoma who follows up for neuropathy.  UPDATE: Current medications:  Venlafaxine XR 280m, propranolol 883mBID, amlodipine, Lasix, isoniazid, B6  She is following up regarding the shaking.  She now describes jerking movement rather than tremor.  It started first in her hands with activity or holding objects.  It has since included brief jerk of other parts of her body as well.  She also reports numbness in the arms.     HISTORY: NEUROPATHY: In 2016, she started experiencing burning pain from the bottom of her feet up the back of her legs to the knees. It is intermittent and lasts for various and unknown period of time, but not aware of any triggers such as due to position. In addition, she is reporting increased back pain and spasms, worse when sitting and improved when laying supine. She has a history of chronic low back pain with left lumbar radiculopathy, as well as right knee pain. She has been seen and evaluated by orthopedics and pain management. MRI of lumbar spine from 07/27/16 revealed advanced L4-5 and L5-S1 facet arthrosis with some bilateral neural foraminal stenosis. Dr. DeMarlou Saf orthopedics did not think surgery was an option at that time. TSH from 10/15/16 was 1.67. B12 from 10/18/16 was 537. Hgb A1c from 04/05/17 was 5.6. NCV-EMG from 10/25/16 demonstrated no evidence of large fiber sensorimotor polyneuropathy or lumbosacral radiculopathy.   Other History: VISUAL DISTURBANCE: Symptoms started in 2014. She describes initially seeing a line of light along the bottom of her visual field in the left eye. She then developed what looks like bubbles floating up in the temporal aspect of the visual fields of in both eyes. It lasts only a few  seconds and occurs several times a day. There is no vision loss. There is no associated headache. There was a concern for diabetic retinopathy. She saw Dr. GrKaty Fitchan ophthalmologist on 11/02/13. The exam did not reveal diabetic retinopathy, but a cardiac evaluation was recommended. Possibly ocular migraines.  DIZZINESS: Also in 2014, she began feeling dizzy. She describes it as a feeling of lightheadedness, as if she is going to pass out. There is no spinning sensation. She sometimes hears "static" during these spells. She has been found to have low blood pressure and is orthostatic. She also began feeling diffuse pain in her joints. Uric acid level was 11.4. HCTZ was discontinued nd she was placed on colchicine.   HAND TREMORS:   Since early 2019, she notes that her hands will start moving spontaneously. It occurs when she is using her hands in particular position. For example, if she is holding her cell phone with her index finger on the screen, her index finger will start to tremor or even start tapping. She notices that her hand will shake a bit if she is holding a utensil. It does not occur when her hands are completely at rest, such as laying flat on her lap. She has some numbness in the hands related to her neuropathy, but there is no associated pain or weakness. Postural vs psychogenic tremor.    MEMORY DEFICITS: Started in 2018.  She reports misplacing objects or forgetting things she said.  Sometimes when she is cooking, she may do something that wasn't required by the recipe.  She reports anxiety while driving but  no disorientation on familiar routes.  She has forgotten to pay bills but then remembers and is able to pay within the grace period.  She sometimes has trouble recalling her grandchildren's names for a moment.  She does have depression and anxiety.  Labs from May 2019 included normal B12 528 and TSH 1.37.  Her mother had Alzheimer's.   She also reports tremors or  abnormal movements involving all of her body.  She underwent neuropsychological testing 07/09/18.  She demonstrated probable deficits of processing speed, executive function, visual memory significantly more than verbal memory, and Ecologist.  It was noted that such a profile may be seen in early Parkinson's disease or a Parkinson's plus syndrome.  However, her performance may have been skewed due to observed anxiety and mildly suboptimal task persistence.  It was noted that her symptoms may conceivably be somatoform and may be due to premorbid ADHD, emotional stress and physical disability.  Regardless, she did not meet criteria for any type of dementia.  In my opinion, I believe that her cognitive deficits are likely related to psychological factors.  I have examined her on multiple occasions and she does not exhibit any symptoms of parkinsonism on exam.   MRI of brain without contrast from 12/19/13 demonstrated incidental small 11 mm dural calcification but nothing concerning.  PAST MEDICAL HISTORY: Past Medical History:  Diagnosis Date  . Allergic rhinitis, cause unspecified   . Allergy   . Anxiety   . Blood dyscrasia    platelets low in past  . Cataract   . Chronic back pain   . Chronic constipation   . Chronic sinusitis   . Depression   . Endometriosis   . ENDOMETRIOSIS 05/06/2009   Qualifier: Diagnosis of  By: Varney Daily RN, Butch Penny    . Esophageal stricture   . GERD (gastroesophageal reflux disease)   . Hemorrhoids   . Hiatal hernia   . Hyperlipidemia   . Hypertension   . Irritable bowel syndrome   . Lymphedema   . Osteoarthritis   . Personal history of diabetes mellitus    controlled with diet only  . Renal cell cancer ([place]) 11/2010 dx   R, s/p cryoablation 02/16/11  . Syncopal episodes    since childhood    MEDICATIONS: Current Outpatient Medications on File Prior to Visit  Medication Sig Dispense Refill  . Accu-Chek FastClix Lancets MISC USE AS DIRECTED UP  TO  1  TIME  DAILY- DX CODE R73.03 100 each 1  . AMBULATORY NON FORMULARY MEDICATION 1 Units by Other route daily. Diabetic Shoes 1 Units 0  . amLODipine (NORVASC) 5 MG tablet Take 1 tablet by mouth daily 90 tablet 0  . atorvastatin (LIPITOR) 10 MG tablet Take 1 tablet by mouth once daily 90 tablet 0  . atropine 1 % ophthalmic solution     . blood glucose meter kit and supplies KIT Dispense based on patient and insurance preference. Use up to four times daily as directed. (FOR R73.03). 1 each 0  . calcium-vitamin D (OSCAL WITH D) 500-200 MG-UNIT tablet Take 1 tablet by mouth daily.     . clotrimazole (LOTRIMIN) 1 % cream clotrimazole 1 % topical cream  APPLY  CREAM TOPICALLY TO AFFECTED AREA AND  SURROUNDING  AREAS  OF  SKIN  TWICE  PER  DAY  IN  THE  MORNING  AND  EVENING    . cromolyn (OPTICROM) 4 % ophthalmic solution as needed.     . Diclofenac Sodium  2 % SOLN Apply 1 pump twice daily. (Patient taking differently: Apply 1 application topically 2 (two) times daily as needed (PAIN). Apply 1 pump twice daily.) 112 g 1  . dicyclomine (BENTYL) 10 MG capsule TAKE 1 TO 2 CAPSULES BY MOUTH THREE TIMES DAILY AS NEEDED FOR SPASMS. 150 capsule 1  . dorzolamide-timolol (COSOPT) 22.3-6.8 MG/ML ophthalmic solution INSTILL 1 DROP INTO RIGHT EYE TWICE DAILY    . DUREZOL 0.05 % EMUL Place 1 drop into the right eye 4 (four) times daily.    Water engineer Bandages & Supports (MEDICAL COMPRESSION SOCKS) MISC Use as directed daily for lymphedema 4 each 0  . fluticasone (FLONASE) 50 MCG/ACT nasal spray Place 1 spray into both nostrils as needed. 16 g 2  . FML FORTE 0.25 % ophthalmic suspension     . folic acid (FOLVITE) 1 MG tablet Take 1 mg by mouth daily.    . furosemide (LASIX) 20 MG tablet Take 1 tablet (20 mg total) by mouth daily. 90 tablet 1  . glucose blood (ACCU-CHEK GUIDE) test strip Use to check sugars once daily. Dx Code-R73.03 100 each 1  . isoniazid (NYDRAZID) 300 MG tablet Take 1 tablet (300 mg total) by mouth  daily. 30 tablet 8  . lidocaine (LIDODERM) 5 % Place 1 patch onto the skin daily. Remove & Discard patch within 12 hours or as directed by MD 90 patch 1  . LINZESS 145 MCG CAPS capsule TAKE ONE CAPSULE BY MOUTH ONCE DAILY 30 capsule 1  . Loteprednol Etabonate (EYSUVIS) 0.25 % SUSP Eysuvis 0.25 % eye drops,suspension    . methotrexate 2.5 MG tablet     . Multiple Vitamin (MULTIVITAMIN) tablet Take 1 tablet by mouth daily.    . Olopatadine HCl (PATADAY) 0.2 % SOLN Apply to eye.    . pantoprazole (PROTONIX) 40 MG tablet Take 1 tablet (40 mg total) by mouth daily. PHARMACY-PLEASE D/C 1 MONTH RX SENT IN ERROR 90 tablet 0  . polyethylene glycol powder (GLYCOLAX/MIRALAX) 17 GM/SCOOP powder DISSOLVE 17 GRAMS (1 CAPFUL) INTO 8 OZ WATER/JUICE AND DRINK DAILY AS NEEDED FOR CONSTIPATION. NEEDS APPT FOR FURTHER REFILLS 527 g 1  . PROCTO-MED HC 2.5 % rectal cream APPLY  CREAM RECTALLY AT BEDTIME FOR  UP  TO  10  DAYS 30 g 0  . propranolol (INDERAL) 80 MG tablet Take 1 tablet (80 mg total) by mouth 2 (two) times daily. 180 tablet 1  . pyridOXINE (VITAMIN B-6) 50 MG tablet Take 1 tablet (50 mg total) by mouth daily. 30 tablet 8  . telmisartan (MICARDIS) 80 MG tablet Take 1 tablet by mouth daily 90 tablet 0  . terconazole (TERAZOL 3) 0.8 % vaginal cream SMARTSIG:1 Applicator Vaginal Daily    . venlafaxine XR (EFFEXOR-XR) 75 MG 24 hr capsule TAKE 3 CAPSULES BY MOUTH ONCE DAILY WITH BREAKFAST 270 capsule 0  . zolpidem (AMBIEN) 10 MG tablet Take 1 tablet (10 mg total) by mouth at bedtime as needed for sleep. 90 tablet 0   No current facility-administered medications on file prior to visit.    ALLERGIES: Allergies  Allergen Reactions  . Sertraline Hcl Other (See Comments)    Spasms; numbness  . Ace Inhibitors Cough  . Codeine Palpitations  . Darifenacin Hydrobromide Er Other (See Comments)    Pt states made her feel queezy & drunk  . Gabapentin Other (See Comments)    Hallucinations  . Pravastatin Other  (See Comments)    myalgias  . Propoxyphene Hcl Palpitations  .  Wellbutrin [Bupropion Hcl] Other (See Comments)    Numbness of mouth/lips    FAMILY HISTORY: Family History  Problem Relation Age of Onset  . Diabetes Mother   . Hypertension Mother   . Hyperlipidemia Mother   . Heart disease Mother   . Stroke Mother   . Kidney disease Mother   . Thyroid disease Mother   . Sleep apnea Mother   . Obesity Mother   . Kidney disease Father   . Prostate cancer Father   . Alcoholism Father   . Alcohol abuse Father   . Colitis Brother   . Alcohol abuse Brother   . Leukemia Brother   . Multiple sclerosis Sister   . Colon polyps Other   . Breast cancer Maternal Aunt   . Breast cancer Cousin   . Alcohol abuse Daughter   . Colon cancer Neg Hx   . Esophageal cancer Neg Hx   . Pancreatic cancer Neg Hx   . Rectal cancer Neg Hx   . Stomach cancer Neg Hx    SOCIAL HISTORY: Social History   Socioeconomic History  . Marital status: Legally Separated    Spouse name: Not on file  . Number of children: 4  . Years of education: Not on file  . Highest education level: Not on file  Occupational History  . Occupation: Disabled  Tobacco Use  . Smoking status: Never Smoker  . Smokeless tobacco: Never Used  Vaping Use  . Vaping Use: Never used  Substance and Sexual Activity  . Alcohol use: No    Comment: rare  . Drug use: No  . Sexual activity: Never  Other Topics Concern  . Not on file  Social History Narrative  . Not on file   Social Determinants of Health   Financial Resource Strain: Not on file  Food Insecurity: Not on file  Transportation Needs: Not on file  Physical Activity: Not on file  Stress: Not on file  Social Connections: Not on file  Intimate Partner Violence: Not on file     Objective:  Blood pressure 140/77, pulse 70, height 5' 2"  (1.575 m), weight 258 lb 9.6 oz (117.3 kg), SpO2 99 %. General: No acute distress.  Patient appears well-groomed.   Head:   Normocephalic/atraumatic Eyes:  Fundi examined but not visualized Neck: supple, no paraspinal tenderness, full range of motion Heart:  Regular rate and rhythm Lungs:  Clear to auscultation bilaterally Back: No paraspinal tenderness Neurological Exam: alert and oriented to person, place, and time. Attention span and concentration intact, recent and remote memory intact, fund of knowledge intact.  Speech fluent and not dysarthric, language intact.  CN II-XII intact. Bulk and tone normal, muscle strength 5/5 throughout.  Sensation to light touch, temperature and vibration intact.  Deep tendon reflexes 1+ throughout, toes downgoing.  Finger to nose and heel to shin testing intact.  Gait wide-based.  Romberg negative.   Assessment/Plan:   1.  Myoclonus.  Currently not on medication that would be causing it.  She does not exhibit myelopathic signs on exam, although hyperreflexia may be absent in setting of underlying neuropathy.  Will check MRI of cervical spine.  1.  MRI of cervical spine 2.  Follow up after testing  Metta Clines, DO  CC: Billey Gosling, MD

## 2020-06-02 NOTE — Telephone Encounter (Signed)
Janet Le (KeySharene Butters)  This request has been approved.  Please note any additional information provided by [facility] at the bottom of your screen.

## 2020-06-03 ENCOUNTER — Encounter: Payer: Self-pay | Admitting: Neurology

## 2020-06-03 ENCOUNTER — Ambulatory Visit: Payer: Medicare Other | Admitting: Neurology

## 2020-06-03 ENCOUNTER — Other Ambulatory Visit: Payer: Self-pay

## 2020-06-03 VITALS — BP 140/77 | HR 70 | Ht 62.0 in | Wt 258.6 lb

## 2020-06-03 DIAGNOSIS — R2681 Unsteadiness on feet: Secondary | ICD-10-CM

## 2020-06-03 DIAGNOSIS — R200 Anesthesia of skin: Secondary | ICD-10-CM | POA: Diagnosis not present

## 2020-06-03 DIAGNOSIS — R202 Paresthesia of skin: Secondary | ICD-10-CM | POA: Diagnosis not present

## 2020-06-03 DIAGNOSIS — G253 Myoclonus: Secondary | ICD-10-CM

## 2020-06-03 NOTE — Patient Instructions (Signed)
Will check MRI of cervical spine without contrast Follow up after testing

## 2020-06-06 DIAGNOSIS — H409 Unspecified glaucoma: Secondary | ICD-10-CM | POA: Insufficient documentation

## 2020-06-06 DIAGNOSIS — F909 Attention-deficit hyperactivity disorder, unspecified type: Secondary | ICD-10-CM

## 2020-06-06 HISTORY — DX: Attention-deficit hyperactivity disorder, unspecified type: F90.9

## 2020-06-06 HISTORY — DX: Unspecified glaucoma: H40.9

## 2020-06-15 ENCOUNTER — Telehealth: Payer: Self-pay | Admitting: Internal Medicine

## 2020-06-15 NOTE — Telephone Encounter (Signed)
   Patient calling to report ALL of her medication were accidentally thrown away. Requesting refill be sent to  [place] ([place] [place] - [address]  telmisartan (MICARDIS) 80 MG tablet  furosemide (LASIX) 20 MG tablet  atorvastatin (LIPITOR) 10 MG tablet  amLODipine (NORVASC) 5 MG tablet

## 2020-06-16 ENCOUNTER — Other Ambulatory Visit: Payer: Self-pay

## 2020-06-16 MED ORDER — ATORVASTATIN CALCIUM 10 MG PO TABS: 10.0000 mg | ORAL_TABLET | Freq: Every day | ORAL | 0 refills | Status: DC

## 2020-06-16 MED ORDER — TELMISARTAN 80 MG PO TABS
80.0000 mg | ORAL_TABLET | Freq: Every day | ORAL | 0 refills | Status: DC
Start: 2020-06-16 — End: 2020-08-16

## 2020-06-16 MED ORDER — AMLODIPINE BESYLATE 5 MG PO TABS: 5.0000 mg | ORAL_TABLET | Freq: Every day | ORAL | 0 refills | Status: DC

## 2020-06-16 MED ORDER — FUROSEMIDE 20 MG PO TABS: 20.0000 mg | ORAL_TABLET | Freq: Every day | ORAL | 1 refills | Status: DC

## 2020-06-16 NOTE — Telephone Encounter (Signed)
Patient calling back to check the status

## 2020-06-19 ENCOUNTER — Other Ambulatory Visit: Payer: Self-pay | Admitting: Internal Medicine

## 2020-06-23 ENCOUNTER — Telehealth (HOSPITAL_COMMUNITY): Payer: Self-pay

## 2020-06-23 ENCOUNTER — Telehealth (HOSPITAL_COMMUNITY): Payer: Self-pay | Admitting: *Deleted

## 2020-06-23 NOTE — Telephone Encounter (Signed)
Writer returned pt call regarding refilling the Adderall. Pt says that she accidentally threw all her meds away in the trash dumpster. Writer then spoke with pt pharmacy and they confirmed that pt did fill last Rx on 06/02/20 and has called them asking for refill as well. They said they did have to override all her other meds to refill them. Pt next appointment on 07/08/20. Please review.

## 2020-06-23 NOTE — Telephone Encounter (Signed)
Medication management - Telephone message from pt requesting a refill of her Adderall be sent into her pharmacy.  Medication last ordered 06/02/20. Patient scheduled next on 07/08/20 and may need a new order on file prior to appointment.

## 2020-06-27 ENCOUNTER — Other Ambulatory Visit (HOSPITAL_COMMUNITY): Payer: Self-pay | Admitting: Psychiatry

## 2020-06-27 MED ORDER — AMPHETAMINE-DEXTROAMPHET ER 20 MG PO CP24: 20.0000 mg | ORAL_CAPSULE | Freq: Every day | ORAL | 0 refills | Status: DC

## 2020-06-27 NOTE — Telephone Encounter (Signed)
I reordered Adderall then.

## 2020-07-08 ENCOUNTER — Telehealth (HOSPITAL_COMMUNITY): Payer: Medicare Other | Admitting: Psychiatry

## 2020-07-08 ENCOUNTER — Other Ambulatory Visit: Payer: Self-pay

## 2020-07-12 ENCOUNTER — Telehealth (INDEPENDENT_AMBULATORY_CARE_PROVIDER_SITE_OTHER): Payer: Medicare Other | Admitting: Internal Medicine

## 2020-07-12 ENCOUNTER — Other Ambulatory Visit: Payer: Self-pay

## 2020-07-12 ENCOUNTER — Telehealth: Payer: Self-pay | Admitting: Neurology

## 2020-07-12 ENCOUNTER — Encounter: Payer: Self-pay | Admitting: Internal Medicine

## 2020-07-12 DIAGNOSIS — Z227 Latent tuberculosis: Secondary | ICD-10-CM

## 2020-07-12 NOTE — Telephone Encounter (Signed)
Patient called in wanting to find out if Dr. Tomi Likens had wanted her to see a Rheumatologist? She feels like she remembers something about that but had not heard anything. I did not see a referral for one.

## 2020-07-12 NOTE — Telephone Encounter (Signed)
I don't remember the conversation but if she reported symptoms that sounded like possibly rheumatologic, I would have recommended discussing with her PCP to see if seeing a rheumatologist was appropriate.

## 2020-07-13 ENCOUNTER — Other Ambulatory Visit: Payer: Self-pay

## 2020-07-13 ENCOUNTER — Telehealth (INDEPENDENT_AMBULATORY_CARE_PROVIDER_SITE_OTHER): Payer: Medicare Other | Admitting: Psychiatry

## 2020-07-13 DIAGNOSIS — F909 Attention-deficit hyperactivity disorder, unspecified type: Secondary | ICD-10-CM | POA: Diagnosis not present

## 2020-07-13 DIAGNOSIS — F341 Dysthymic disorder: Secondary | ICD-10-CM | POA: Diagnosis not present

## 2020-07-13 MED ORDER — AMPHETAMINE-DEXTROAMPHET ER 30 MG PO CP24: 30.0000 mg | ORAL_CAPSULE | Freq: Every day | ORAL | 0 refills | Status: DC

## 2020-07-13 MED ORDER — DULOXETINE HCL 60 MG PO CPEP: 60.0000 mg | ORAL_CAPSULE | Freq: Every day | ORAL | 1 refills | Status: DC
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
## 2020-07-13 NOTE — Telephone Encounter (Signed)
Pt advised of to discuss with PCP for referral for Rheumatologist . Pt given [facility] number to schedule her MRI Cervical Spine. Pt states she never got a call from them.

## 2020-07-14 ENCOUNTER — Encounter: Payer: Self-pay | Admitting: Internal Medicine

## 2020-07-14 NOTE — Assessment & Plan Note (Signed)
No issues with the medication and she will complete 9 months total.  From an ID standpoint, she can start any immunomodulators or other agents as indicated for her eye disease while she completes her INH.   Her LFTs were normal in November so no issues.  She understands she needs to complete the 9 months and will be done.  I discussed the medication is to reduce her chance of developing active Tb in the future, particularly if she requires immunosuppressive drugs, but that it is not 100% effective in eradication.  She voiced her understanding.  She will return as needed.

## 2020-07-14 NOTE — Progress Notes (Signed)
   Subjective:    Patient ID: Janet Le, female    DOB: [birth year], 65 y.o.   MRN: [id number]  HPI I connected with  Janet Le on 07/14/20 by telephone and verified that I am speaking with the correct person using two identifiers.   I discussed the limitations of evaluation and management by telemedicine. The patient expressed understanding and agreed to proceed.  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 Manuella Ghazi.  I started her on INH for a projected 9 months for latent Tb and she has been taking this daily since that time, now in her 7th month.  She did not return for follow up after starting the medication but reports no issues with rash, abdominal pain or diarrhea.  She had her AST and ALT checked at another facility in November and it was wnl.  She has since seen Dr. Manuella Ghazi once and was referred to rheumatology but she has not received any appointment.  She reports her eyesight is about the same.  HIV negative in April 2021.     Review of Systems  Gastrointestinal: Negative for diarrhea, nausea and vomiting.  Skin: Negative for rash.       Objective:   Physical Exam Neurological:     Mental Status: She is alert.  Psychiatric:        Mood and Affect: Mood normal.           Assessment & Plan:

## 2020-08-05 ENCOUNTER — Ambulatory Visit: Payer: Medicare Other | Admitting: Internal Medicine

## 2020-08-16 ENCOUNTER — Other Ambulatory Visit: Payer: Self-pay | Admitting: Internal Medicine

## 2020-08-16 ENCOUNTER — Telehealth (INDEPENDENT_AMBULATORY_CARE_PROVIDER_SITE_OTHER): Payer: 59 | Admitting: Psychiatry

## 2020-08-16 ENCOUNTER — Other Ambulatory Visit: Payer: Self-pay

## 2020-08-16 DIAGNOSIS — F909 Attention-deficit hyperactivity disorder, unspecified type: Secondary | ICD-10-CM | POA: Diagnosis not present

## 2020-08-16 DIAGNOSIS — F341 Dysthymic disorder: Secondary | ICD-10-CM | POA: Diagnosis not present

## 2020-08-16 MED ORDER — AMPHETAMINE-DEXTROAMPHETAMINE 30 MG PO TABS: 30.0000 mg | ORAL_TABLET | Freq: Two times a day (BID) | ORAL | 0 refills | Status: DC

## 2020-08-16 MED ORDER — DULOXETINE HCL 60 MG PO CPEP: 60.0000 mg | ORAL_CAPSULE | Freq: Every day | ORAL | 1 refills | Status: DC
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
## 2020-08-26 ENCOUNTER — Telehealth (HOSPITAL_COMMUNITY): Payer: Self-pay

## 2020-08-26 NOTE — Telephone Encounter (Signed)
Patient called and stated that since she's started the Adderall 30mg  she's been seeing things that aren't there. Please review and advise. Thank you

## 2020-08-27 ENCOUNTER — Other Ambulatory Visit (HOSPITAL_COMMUNITY): Payer: Self-pay | Admitting: Psychiatry

## 2020-08-27 MED ORDER — AMPHETAMINE-DEXTROAMPHETAMINE 20 MG PO TABS: 20.0000 mg | ORAL_TABLET | Freq: Two times a day (BID) | ORAL | 0 refills | Status: DC

## 2020-08-27 NOTE — Telephone Encounter (Signed)
It is rare but possible adverse effect of stimulants. She tolerated 30 mg of Extended release form but this is doubling the dose. I recommend going down to 20 mg bid of instant release - new Rx send to pharmacy. She could check with her pharmacy if they would fill her new Rx early if she brings her remaining tablets from an "old" one?

## 2020-08-29 ENCOUNTER — Telehealth (INDEPENDENT_AMBULATORY_CARE_PROVIDER_SITE_OTHER): Payer: 59 | Admitting: Psychiatry

## 2020-08-29 ENCOUNTER — Other Ambulatory Visit: Payer: Self-pay

## 2020-08-29 DIAGNOSIS — F909 Attention-deficit hyperactivity disorder, unspecified type: Secondary | ICD-10-CM | POA: Diagnosis not present

## 2020-08-29 DIAGNOSIS — F341 Dysthymic disorder: Secondary | ICD-10-CM | POA: Diagnosis not present

## 2020-08-29 MED ORDER — MODAFINIL 100 MG PO TABS: 100.0000 mg | ORAL_TABLET | Freq: Every day | ORAL | 0 refills | Status: DC
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
## 2020-08-31 ENCOUNTER — Telehealth (HOSPITAL_COMMUNITY): Payer: Self-pay | Admitting: *Deleted

## 2020-08-31 NOTE — Telephone Encounter (Signed)
Prior athorization obtained for Modafinil.  The approval letter states "This approval is for 08/29/20 until further notice."  Authorization number# [id number]

## 2020-09-05 ENCOUNTER — Telehealth: Payer: Self-pay | Admitting: Internal Medicine

## 2020-09-05 NOTE — Telephone Encounter (Signed)
What is she seeing?  Any other side effects or symptoms?  What medication does she think this is related to?

## 2020-09-05 NOTE — Telephone Encounter (Signed)
[facility] has called saying they suggested the patient calls 911 but the patient refused and would rather be seen in office.

## 2020-09-05 NOTE — Telephone Encounter (Signed)
Patient states that her medications are making her hallucinate and see things that aren't there. Transferred her to [facility] for further evaluation.

## 2020-09-05 NOTE — Telephone Encounter (Signed)
Spoke with patient today.  She states she was seeing things that weren't there but then they were there. No other symptoms. States she does feel warm sometimes when taking her medication and weak.  She had a neighbor come over to look up side -effects but she didn't know which medication could be causing it. She said when she takes her medications she takes them all together.  She will bring all her medications in on Wednesday when she comes in for her appointment.

## 2020-09-06 ENCOUNTER — Other Ambulatory Visit: Payer: Self-pay | Admitting: Internal Medicine

## 2020-09-06 ENCOUNTER — Telehealth (HOSPITAL_COMMUNITY): Payer: Self-pay

## 2020-09-06 NOTE — Telephone Encounter (Signed)
DID A  PRIOR AUTHORIZATION ON PATIENT'S: MODAFINIL 100MG  TABLET FOR PRESCRIPTION SENT IN ON 3722 SPOKE WITH BETTY AT [facility]/[place] SHE STATED THEY HAVE A PAID CLAIM  FOR $3.95

## 2020-09-06 NOTE — Telephone Encounter (Signed)
REFERENCE # [id number]

## 2020-09-06 NOTE — Progress Notes (Signed)
Subjective:    Patient ID: Janet Le, female    DOB: [birth year], 65 y.o.   MRN: [id number]  HPI The patient is here for an acute visit.  She is here with her friend Kenya.    The patient provides the history.  In October she started to feel some confusion. She was charged her for her new recliner and it did not go through and she paid again.  She ended up paying twice and then had to fight to get the money back.  She cannot remember that she had paid and felt confused about it.  She feels like her symptoms got worse in December.    Last month she was having hallucinations.  She thinks one man she lives in her complex does some witchcraft.  Her blinds do not slide back and forth and at one point they were going back and forth.  She felt like her neighbor may have done that.  She has faces on metal rods and at one point the faces were making expressions.  Wednesday she saw several people from her apartment complex  socializing in her apt - she went over and tried to touch the people and realized they were not there.     She wonders if she brought bad spirits in her apartment.  She thinks the man who lives above her projects something into her apartment and that is how we can do things in it.  At times she will see people in her apartment-she did not invite them in her apartment and they were there - she knows this was not real.  She knows she is seeing something that is not there.    One day she saw someone in her car.   She has called the police 4-5 times for different reasons.        She started the modafinil today.  She has been on isoniazid since the beginning of June.   She continues to have chronic neck pain and cervical radiculopathy.  The neurologist had ordered an MRI, but this was not scheduled.  She is frustrated by her chronic pain in her neck, upper back and down her arms.  Medications and allergies reviewed with patient and updated if appropriate.  Patient Active  Problem List   Diagnosis Date Noted  . TB lung, latent 11/25/2019  . Blepharitis 11/07/2019  . Nocturnal hypoxemia 05/11/2019  . Dyspnea on exertion 05/11/2019  . Excessive daytime sleepiness 05/11/2019  . Primary osteoarthritis involving multiple joints 03/10/2019  . Adult ADHD 01/08/2019  . Chronic post-traumatic stress disorder (PTSD) 08/12/2018  . Bilateral leg edema 06/12/2018  . Fatigue 11/14/2017  . Chronic back pain 11/14/2017  . Arthralgia of hip 11/14/2017  . Memory difficulties 10/08/2017  . Anxiety and depression 10/08/2017  . Neuropathy 10/07/2017  . LVH (left ventricular hypertrophy), mild 05/18/2017  . Arthritis of knee 10/30/2016  . S/P knee surgery 10/30/2016  . Prediabetes 05/30/2016  . Cervicalgia 05/30/2016  . Osteopenia 11/18/2015  . Thrombocytopenia ([place]) 05/30/2015  . Gout 12/11/2013  . Morbid obesity ([place]) 11/06/2013  . Cough 07/15/2012  . Left lumbar radiculopathy 04/13/2012  . Renal cell cancer ([place])   . Hemorrhoids 11/14/2010  . IBS (irritable bowel syndrome) with chronic constipation 11/14/2010  . GERD (gastroesophageal reflux disease) 11/14/2010  . HYPERCHOLESTEROLEMIA, MILD 03/16/2010  . Lymphedema 03/15/2010  . ALLERGIC RHINITIS 08/31/2009  . MIGRAINE HEADACHE 06/14/2009  . SINUSITIS, CHRONIC 06/14/2009  . SYNCOPE 05/16/2009  . Essential  hypertension 05/06/2009    Current Outpatient Medications on File Prior to Visit  Medication Sig Dispense Refill  . Accu-Chek FastClix Lancets MISC USE AS DIRECTED UP  TO  1 TIME  DAILY- DX CODE R73.03 100 each 1  . acetaminophen (TYLENOL) 500 MG tablet Take 500 mg by mouth every 6 (six) hours as needed.    [name] amLODipine (NORVASC) 5 MG tablet Take 1 tablet by mouth once daily 90 tablet 0  . amoxicillin (AMOXIL) 500 MG capsule Take 500 mg by mouth 3 (three) times daily.    [name] atorvastatin (LIPITOR) 10 MG tablet Take 1 tablet by mouth once daily 90 tablet 0  . blood glucose meter kit and supplies KIT Dispense  based on patient and insurance preference. Use up to four times daily as directed. (FOR R73.03). 1 each 0  . dicyclomine (BENTYL) 10 MG capsule TAKE 1 TO 2 CAPSULES BY MOUTH THREE TIMES DAILY AS NEEDED **NEEDS  APPOINTMENT  FOR  FURTHER  REFILLS** 150 capsule 0  . DULoxetine (CYMBALTA) 60 MG capsule Take 1 capsule (60 mg total) by mouth daily. 30 capsule 1  . glucose blood (ACCU-CHEK GUIDE) test strip Use to check sugars once daily. Dx Code-R73.03 100 each 1  . isoniazid (NYDRAZID) 300 MG tablet Take 1 tablet (300 mg total) by mouth daily. 30 tablet 8  . LINZESS 145 MCG CAPS capsule TAKE ONE CAPSULE BY MOUTH ONCE DAILY 30 capsule 1  . methotrexate 2.5 MG tablet     . Misc Natural Products (HEALTHY LIVER PO) Take by mouth.    . modafinil (PROVIGIL) 100 MG tablet Take 1 tablet (100 mg total) by mouth daily. 30 tablet 0  . Multiple Vitamin (MULTIVITAMIN) tablet Take 1 tablet by mouth daily.    . Probiotic Product (PROBIOTIC-10 PO) Take 6 Billion Cells by mouth.    . propranolol (INDERAL) 80 MG tablet Take 1 tablet by mouth twice daily 180 tablet 0  . pyridOXINE (VITAMIN B-6) 50 MG tablet Take 1 tablet (50 mg total) by mouth daily. 30 tablet 8  . telmisartan (MICARDIS) 80 MG tablet Take 1 tablet by mouth once daily 90 tablet 0  . AMBULATORY NON FORMULARY MEDICATION 1 Units by Other route daily. Diabetic Shoes 1 Units 0  . furosemide (LASIX) 20 MG tablet Take 1 tablet (20 mg total) by mouth daily. (Patient not taking: Reported on 09/07/2020) 90 tablet 1   No current facility-administered medications on file prior to visit.    Past Medical History:  Diagnosis Date  . Allergic rhinitis, cause unspecified   . Allergy   . Anxiety   . Blood dyscrasia    platelets low in past  . Cataract   . Chronic back pain   . Chronic constipation   . Chronic sinusitis   . Depression   . Endometriosis   . ENDOMETRIOSIS 05/06/2009   Qualifier: Diagnosis of  By: Varney Daily RN, Butch Penny    . Esophageal stricture    . GERD (gastroesophageal reflux disease)   . Hemorrhoids   . Hiatal hernia   . Hyperlipidemia   . Hypertension   . Irritable bowel syndrome   . Lymphedema   . Osteoarthritis   . Personal history of diabetes mellitus    controlled with diet only  . Renal cell cancer ([place]) 11/2010 dx   R, s/p cryoablation 02/16/11  . Syncopal episodes    since childhood    Past Surgical History:  Procedure Laterality Date  . BREAST CYST EXCISION Bilateral  over 10 years ago   No visable scar   . CATARACT EXTRACTION, BILATERAL Bilateral 2020  . COLONOSCOPY    . fallopian tube removed    . FUNCTIONAL ENDOSCOPIC SINUS SURGERY    . HEMORRHOID BANDING    . LASER ABLATION OF THE CERVIX    . NASAL SINUS SURGERY    . 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 TOTAL KNEE ARTHROPLASTY Right 10/30/2016   Procedure: RIGHT TOTAL KNEE ARTHROPLASTY;  Surgeon: Meredith Pel, MD;  Location: [facility];  Service: Orthopedics;  Laterality: Right;  . TUBAL LIGATION    . UMBILICAL HERNIA REPAIR      Social History   Socioeconomic History  . Marital status: Legally Separated    Spouse name: Not on file  . Number of children: 4  . Years of education: Not on file  . Highest education level: Not on file  Occupational History  . Occupation: Disabled  Tobacco Use  . Smoking status: Never Smoker  . Smokeless tobacco: Never Used  Vaping Use  . Vaping Use: Never used  Substance and Sexual Activity  . Alcohol use: No    Comment: rare  . Drug use: No  . Sexual activity: Never  Other Topics Concern  . Not on file  Social History Narrative   Right handed   Social Determinants of Health   Financial Resource Strain: Not on file  Food Insecurity: Not on file  Transportation Needs: Not on file  Physical Activity: Not on file  Stress: Not on file  Social Connections: Not on file    Family History  Problem Relation Age of Onset  . Diabetes Mother   . Hypertension Mother   .  Hyperlipidemia Mother   . Heart disease Mother   . Stroke Mother   . Kidney disease Mother   . Thyroid disease Mother   . Sleep apnea Mother   . Obesity Mother   . Kidney disease Father   . Prostate cancer Father   . Alcoholism Father   . Alcohol abuse Father   . Colitis Brother   . Alcohol abuse Brother   . Leukemia Brother   . Multiple sclerosis Sister   . Colon polyps Other   . Breast cancer Maternal Aunt   . Breast cancer Cousin   . Alcohol abuse Daughter   . Colon cancer Neg Hx   . Esophageal cancer Neg Hx   . Pancreatic cancer Neg Hx   . Rectal cancer Neg Hx   . Stomach cancer Neg Hx     Review of Systems  Constitutional: Negative for chills and fever.       3 times hot and weak and felt like she was gong to pass out - occurred at [employer]  Respiratory: Negative for cough, shortness of breath and wheezing.   Cardiovascular: Negative for chest pain and palpitations.  Gastrointestinal: Negative for abdominal pain.  Genitourinary: Positive for frequency. Negative for dysuria and hematuria.  Neurological: Negative for dizziness, light-headedness and headaches.  Psychiatric/Behavioral: Positive for confusion, dysphoric mood (because people do not believe her) and hallucinations. Negative for decreased concentration and sleep disturbance. The patient is nervous/anxious (related to her current symptoms, traffic and negative news).        Objective:   Vitals:   09/07/20 1143  BP: (!) 144/82  Pulse: 79  Temp: 98.4 F (36.9 C)  SpO2: 99%   BP Readings from Last 3 Encounters:  09/07/20 [name])  144/82  06/03/20 140/77  04/06/20 132/70   Wt Readings from Last 3 Encounters:  09/07/20 245 lb (111.1 kg)  06/03/20 258 lb 9.6 oz (117.3 kg)  04/06/20 251 lb (113.9 kg)   Body mass index is 44.81 kg/m.   Physical Exam Constitutional:      General: She is not in acute distress.    Appearance: Normal appearance. She is not ill-appearing.  HENT:     Head: Normocephalic and  atraumatic.  Cardiovascular:     Rate and Rhythm: Normal rate and regular rhythm.     Heart sounds: No murmur heard.   Pulmonary:     Effort: Pulmonary effort is normal. No respiratory distress.     Breath sounds: No rales.  Abdominal:     General: There is no distension.     Palpations: Abdomen is soft.     Tenderness: There is no abdominal tenderness.  Musculoskeletal:     Right lower leg: No edema.     Left lower leg: No edema.  Skin:    General: Skin is warm and dry.  Neurological:     General: No focal deficit present.     Mental Status: She is alert and oriented to person, place, and time.  Psychiatric:        Mood and Affect: Mood normal.        Behavior: Behavior normal.        Thought Content: Thought content normal.        Judgment: Judgment normal.            Assessment & Plan:    See Problem List for Assessment and Plan of chronic medical problems.    This visit occurred during the SARS-CoV-2 public health emergency.  Safety protocols were in place, including screening questions prior to the visit, additional usage of staff PPE, and extensive cleaning of exam room while observing appropriate contact time as indicated for disinfecting solutions.

## 2020-09-07 ENCOUNTER — Ambulatory Visit (INDEPENDENT_AMBULATORY_CARE_PROVIDER_SITE_OTHER): Payer: 59 | Admitting: Internal Medicine

## 2020-09-07 ENCOUNTER — Other Ambulatory Visit: Payer: Self-pay

## 2020-09-07 ENCOUNTER — Encounter: Payer: Self-pay | Admitting: Internal Medicine

## 2020-09-07 ENCOUNTER — Ambulatory Visit: Payer: Medicare (Managed Care) | Admitting: Internal Medicine

## 2020-09-07 VITALS — BP 144/82 | HR 79 | Temp 98.4°F | Ht 62.0 in | Wt 245.0 lb

## 2020-09-07 DIAGNOSIS — M5412 Radiculopathy, cervical region: Secondary | ICD-10-CM | POA: Diagnosis not present

## 2020-09-07 DIAGNOSIS — R410 Disorientation, unspecified: Secondary | ICD-10-CM | POA: Diagnosis not present

## 2020-09-07 DIAGNOSIS — R441 Visual hallucinations: Secondary | ICD-10-CM | POA: Diagnosis not present

## 2020-09-07 DIAGNOSIS — F29 Unspecified psychosis not due to a substance or known physiological condition: Secondary | ICD-10-CM | POA: Diagnosis not present

## 2020-09-07 DIAGNOSIS — R8290 Unspecified abnormal findings in urine: Secondary | ICD-10-CM | POA: Diagnosis not present

## 2020-09-07 HISTORY — DX: Radiculopathy, cervical region: M54.12

## 2020-09-07 LAB — URINALYSIS, ROUTINE W REFLEX MICROSCOPIC
Bilirubin Urine: NEGATIVE
Hgb urine dipstick: NEGATIVE
Ketones, ur: NEGATIVE
Nitrite: NEGATIVE
RBC / HPF: NONE SEEN (ref 0–?)
Specific Gravity, Urine: 1.025 (ref 1.000–1.030)
Total Protein, Urine: NEGATIVE
Urine Glucose: NEGATIVE
Urobilinogen, UA: 0.2 (ref 0.0–1.0)
pH: 5.5 (ref 5.0–8.0)

## 2020-09-07 LAB — TSH: TSH: 1.97 u[IU]/mL (ref 0.35–4.50)

## 2020-09-07 LAB — COMPREHENSIVE METABOLIC PANEL
ALT: 23 U/L (ref 0–35)
AST: 23 U/L (ref 0–37)
Albumin: 4 g/dL (ref 3.5–5.2)
Alkaline Phosphatase: 84 U/L (ref 39–117)
BUN: 14 mg/dL (ref 6–23)
CO2: 29 mEq/L (ref 19–32)
Calcium: 10.8 mg/dL — ABNORMAL HIGH (ref 8.4–10.5)
Chloride: 103 mEq/L (ref 96–112)
Creatinine, Ser: 0.94 mg/dL (ref 0.40–1.20)
GFR: 63.84 mL/min (ref >60.00)
Glucose, Bld: 95 mg/dL (ref 70–99)
Potassium: 3.3 mEq/L — ABNORMAL LOW (ref 3.5–5.1)
Sodium: 140 mEq/L (ref 135–145)
Total Bilirubin: 0.4 mg/dL (ref 0.2–1.2)
Total Protein: 7.6 g/dL (ref 6.0–8.3)

## 2020-09-07 LAB — CBC WITH DIFFERENTIAL/PLATELET
Basophils Absolute: 0 10*3/uL (ref 0.0–0.1)
Basophils Relative: 0.6 % (ref 0.0–3.0)
Eosinophils Absolute: 0.1 10*3/uL (ref 0.0–0.7)
Eosinophils Relative: 0.7 % (ref 0.0–5.0)
HCT: 37.6 % (ref 36.0–46.0)
Hemoglobin: 12.6 g/dL (ref 12.0–15.0)
Lymphocytes Relative: 19.8 % (ref 12.0–46.0)
Lymphs Abs: 1.7 10*3/uL (ref 0.7–4.0)
MCHC: 33.4 g/dL (ref 30.0–36.0)
MCV: 97.5 fl (ref 78.0–100.0)
Monocytes Absolute: 0.6 10*3/uL (ref 0.1–1.0)
Monocytes Relative: 6.9 % (ref 3.0–12.0)
Neutro Abs: 6.1 10*3/uL (ref 1.4–7.7)
Neutrophils Relative %: 72 % (ref 43.0–77.0)
Platelets: 152 10*3/uL (ref 150.0–400.0)
RBC: 3.86 Mil/uL — ABNORMAL LOW (ref 3.87–5.11)
RDW: 15.2 % (ref 11.5–15.5)
WBC: 8.4 10*3/uL (ref 4.0–10.5)

## 2020-09-07 NOTE — Assessment & Plan Note (Signed)
Chronic Has seen neurology and they have ordered a cervical spine MRI, but this has not been scheduled She continues to have pain in her neck, upper back, numbness/tingling in bilateral upper extremities MRI cervical spine ordered

## 2020-09-07 NOTE — Assessment & Plan Note (Signed)
New problem Subacute She has been experiencing what sounds like intermittent confusion and hallucinations over the past few months No obvious explanation-no symptoms to suggest infection ?  Related to medication such as INH which she has been on since June Will get MRI of head CBC, CMP, TSH, urinalysis Will start INH-she is completed 8-1/2 months of the medication-did reach out to Dr. De Burrs to get his thoughts Further evaluation depending on above

## 2020-09-07 NOTE — Assessment & Plan Note (Signed)
Subacute She has been experiencing what sounds like intermittent confusion over the past few months No obvious explanation-no symptoms to suggest infection ?  Related to medication such as INH which she has been on since June Will get MRI of head CBC, CMP, TSH, urinalysis Will start INH-she is completed 8-1/2 months of the medication-did reach out to Dr. De Burrs to get his thoughts Further evaluation depending on above

## 2020-09-07 NOTE — Patient Instructions (Addendum)
Stop the isoniazid.    Have blood work downstairs today.     An MRI of your head and neck was ordered.      Follow up in 2 weeks.

## 2020-09-08 ENCOUNTER — Other Ambulatory Visit: Payer: Medicare (Managed Care)

## 2020-09-08 DIAGNOSIS — R8290 Unspecified abnormal findings in urine: Secondary | ICD-10-CM

## 2020-09-08 NOTE — Addendum Note (Signed)
Addended by: Marcina Millard on: 09/08/2020 08:02 AM   Modules accepted: Orders

## 2020-09-09 ENCOUNTER — Other Ambulatory Visit (HOSPITAL_COMMUNITY): Payer: Self-pay | Admitting: Psychiatry

## 2020-09-09 LAB — URINE CULTURE
MICRO NUMBER:: 11659587
SPECIMEN QUALITY:: ADEQUATE

## 2020-09-15 ENCOUNTER — Other Ambulatory Visit: Payer: Self-pay | Admitting: Internal Medicine

## 2020-09-15 ENCOUNTER — Other Ambulatory Visit: Payer: Self-pay

## 2020-09-15 ENCOUNTER — Other Ambulatory Visit (HOSPITAL_COMMUNITY): Payer: Self-pay | Admitting: Psychiatry

## 2020-09-15 ENCOUNTER — Telehealth (HOSPITAL_COMMUNITY): Payer: 59 | Admitting: Psychiatry

## 2020-09-15 MED ORDER — MODAFINIL 100 MG PO TABS: 100.0000 mg | ORAL_TABLET | Freq: Every day | ORAL | 0 refills | Status: DC

## 2020-09-15 MED ORDER — DULOXETINE HCL 60 MG PO CPEP: 60.0000 mg | ORAL_CAPSULE | Freq: Every day | ORAL | 1 refills | Status: DC

## 2020-09-20 NOTE — Progress Notes (Signed)
Subjective:    Patient ID: Janet Le, female    DOB: [birth year], 65 y.o.   MRN: [id number]  HPI The patient is here for follow up from her 3/16 appointment.  She was having confusion, hallucinations.  ? Related to [place] - I had her stop this.  Blood work and UA unremarkable.  MRI of brain, c-spine scheduled for 4/2.she sees Dr Tomi Likens on 4/6.  She is here with one of her daughters and her other daughter is on the phone.  She states she was taking her medication incorrectly.  She states that her symptoms really started about the time she started taking Adderall.  She states she had revision of her medication and her symptoms are better.  Her daughters think her symptoms started about 2 months ago.  She is still experiencing confusion and hallucinations.  She still feels her neighbor above her is causing things to occur in her apartment through the vents.  Some of the vents are covered.  She has a horse statue and put a bag over his head because she felt like one of the eyes was blinking.  She feels her neighbors are spying on her.  In the past month she has called the police 5-6 times.  She is not keeping up her apartment.  She is not necessarily cooking for herself.  Her daughter is not sure how much she is eating and I am not sure if she is taking her medication correctly.  I do feel like she needs someone to come in to help her.  She tends to have difficulty cooking and cleaning.  She has to sit down frequently because of back pain, neck pain and osteoarthritis.  She does not drive much.  She is a dog and does not take the dog out.   She is tends to stay in bed almost all day-she states this is because of the pain.    Reviewed her most recent visit with her psychiatrist.  Medications and allergies reviewed with patient and updated if appropriate.  Advised her daughter to review her medication list we have here to the medication she is taking at home to make sure there is no  discrepancies.  The patient is not able to accurately recall all the medications she is taking.  Patient Active Problem List   Diagnosis Date Noted  . Psychosis ([place]) 09/07/2020  . Cervical radiculopathy 09/07/2020  . Hallucination, visual 09/07/2020  . Confusion 09/07/2020  . TB lung, latent 11/25/2019  . Blepharitis 11/07/2019  . Nocturnal hypoxemia 05/11/2019  . Dyspnea on exertion 05/11/2019  . Excessive daytime sleepiness 05/11/2019  . Primary osteoarthritis involving multiple joints 03/10/2019  . Adult ADHD 01/08/2019  . Chronic post-traumatic stress disorder (PTSD) 08/12/2018  . Bilateral leg edema 06/12/2018  . Fatigue 11/14/2017  . Chronic back pain 11/14/2017  . Arthralgia of hip 11/14/2017  . Memory difficulties 10/08/2017  . Anxiety and depression 10/08/2017  . Neuropathy 10/07/2017  . LVH (left ventricular hypertrophy), mild 05/18/2017  . Arthritis of knee 10/30/2016  . S/P knee surgery 10/30/2016  . Prediabetes 05/30/2016  . Cervicalgia 05/30/2016  . Osteopenia 11/18/2015  . Thrombocytopenia ([place]) 05/30/2015  . Gout 12/11/2013  . Morbid obesity ([place]) 11/06/2013  . Cough 07/15/2012  . Left lumbar radiculopathy 04/13/2012  . Renal cell cancer ([place])   . Hemorrhoids 11/14/2010  . IBS (irritable bowel syndrome) with chronic constipation 11/14/2010  . GERD (gastroesophageal reflux disease) 11/14/2010  . HYPERCHOLESTEROLEMIA, MILD 03/16/2010  .  Lymphedema 03/15/2010  . ALLERGIC RHINITIS 08/31/2009  . MIGRAINE HEADACHE 06/14/2009  . SINUSITIS, CHRONIC 06/14/2009  . SYNCOPE 05/16/2009  . Essential hypertension 05/06/2009    Current Outpatient Medications on File Prior to Visit  Medication Sig Dispense Refill  . Accu-Chek FastClix Lancets MISC USE AS DIRECTED UP  TO  1 TIME  DAILY- DX CODE R73.03 100 each 1  . acetaminophen (TYLENOL) 500 MG tablet Take 500 mg by mouth every 6 (six) hours as needed.    [name] amLODipine (NORVASC) 5 MG tablet Take 1 tablet by mouth  once daily 90 tablet 0  . atorvastatin (LIPITOR) 10 MG tablet Take 1 tablet by mouth once daily 90 tablet 0  . clotrimazole (LOTRIMIN) 1 % cream Apply topically 2 (two) times daily.    [name] dicyclomine (BENTYL) 10 MG capsule TAKE 1 TO 2 CAPSULES BY MOUTH THREE TIMES DAILY AS NEEDED **NEEDS  APPOINTMENT  FOR  FURTHER  REFILLS** 150 capsule 0  . DULoxetine (CYMBALTA) 60 MG capsule Take 1 capsule (60 mg total) by mouth daily. 30 capsule 1  . furosemide (LASIX) 20 MG tablet Take 1 tablet (20 mg total) by mouth daily. 90 tablet 1  . glucose blood (ACCU-CHEK GUIDE) test strip Use to check sugars once daily. Dx Code-R73.03 100 each 1  . LINZESS 145 MCG CAPS capsule TAKE ONE CAPSULE BY MOUTH ONCE DAILY 30 capsule 1  . methotrexate 2.5 MG tablet     . Misc Natural Products (HEALTHY LIVER PO) Take by mouth.    . modafinil (PROVIGIL) 100 MG tablet Take 1 tablet (100 mg total) by mouth daily. 30 tablet 0  . Multiple Vitamin (MULTIVITAMIN) tablet Take 1 tablet by mouth daily.    . Probiotic Product (PROBIOTIC-10 PO) Take 6 Billion Cells by mouth.    . propranolol (INDERAL) 80 MG tablet Take 1 tablet by mouth twice daily 180 tablet 0  . pyridOXINE (VITAMIN B-6) 50 MG tablet Take 1 tablet (50 mg total) by mouth daily. 30 tablet 8  . telmisartan (MICARDIS) 80 MG tablet Take 1 tablet by mouth once daily 90 tablet 0   No current facility-administered medications on file prior to visit.    Past Medical History:  Diagnosis Date  . Allergic rhinitis, cause unspecified   . Allergy   . Anxiety   . Blood dyscrasia    platelets low in past  . Cataract   . Chronic back pain   . Chronic constipation   . Chronic sinusitis   . Depression   . Endometriosis   . ENDOMETRIOSIS 05/06/2009   Qualifier: Diagnosis of  By: Varney Daily RN, Butch Penny    . Esophageal stricture   . GERD (gastroesophageal reflux disease)   . Hemorrhoids   . Hiatal hernia   . Hyperlipidemia   . Hypertension   . Irritable bowel syndrome   .  Lymphedema   . Osteoarthritis   . Personal history of diabetes mellitus    controlled with diet only  . Renal cell cancer ([place]) 11/2010 dx   R, s/p cryoablation 02/16/11  . Syncopal episodes    since childhood    Past Surgical History:  Procedure Laterality Date  . BREAST CYST EXCISION Bilateral over 10 years ago   No visable scar   . CATARACT EXTRACTION, BILATERAL Bilateral 2020  . COLONOSCOPY    . fallopian tube removed    . FUNCTIONAL ENDOSCOPIC SINUS SURGERY    . HEMORRHOID BANDING    . LASER ABLATION OF THE CERVIX    .  NASAL SINUS SURGERY    . 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 TOTAL KNEE ARTHROPLASTY Right 10/30/2016   Procedure: RIGHT TOTAL KNEE ARTHROPLASTY;  Surgeon: Meredith Pel, MD;  Location: [facility];  Service: Orthopedics;  Laterality: Right;  . TUBAL LIGATION    . UMBILICAL HERNIA REPAIR      Social History   Socioeconomic History  . Marital status: Legally Separated    Spouse name: Not on file  . Number of children: 4  . Years of education: Not on file  . Highest education level: Not on file  Occupational History  . Occupation: Disabled  Tobacco Use  . Smoking status: Never Smoker  . Smokeless tobacco: Never Used  Vaping Use  . Vaping Use: Never used  Substance and Sexual Activity  . Alcohol use: No    Comment: rare  . Drug use: No  . Sexual activity: Never  Other Topics Concern  . Not on file  Social History Narrative   Right handed   Social Determinants of Health   Financial Resource Strain: Not on file  Food Insecurity: Not on file  Transportation Needs: Not on file  Physical Activity: Not on file  Stress: Not on file  Social Connections: Not on file    Family History  Problem Relation Age of Onset  . Diabetes Mother   . Hypertension Mother   . Hyperlipidemia Mother   . Heart disease Mother   . Stroke Mother   . Kidney disease Mother   . Thyroid disease Mother   . Sleep apnea Mother   .  Obesity Mother   . Kidney disease Father   . Prostate cancer Father   . Alcoholism Father   . Alcohol abuse Father   . Colitis Brother   . Alcohol abuse Brother   . Leukemia Brother   . Multiple sclerosis Sister   . Colon polyps Other   . Breast cancer Maternal Aunt   . Breast cancer Cousin   . Alcohol abuse Daughter   . Colon cancer Neg Hx   . Esophageal cancer Neg Hx   . Pancreatic cancer Neg Hx   . Rectal cancer Neg Hx   . Stomach cancer Neg Hx     Review of Systems  Constitutional: Positive for fatigue. Negative for fever.  Respiratory: Negative for shortness of breath.   Cardiovascular: Negative for chest pain and palpitations.  Musculoskeletal: Positive for arthralgias, back pain and neck pain.  Neurological: Negative for dizziness, light-headedness and headaches.  Psychiatric/Behavioral: Positive for confusion, decreased concentration and dysphoric mood. The patient is nervous/anxious.        Objective:   Vitals:   09/21/20 1359  BP: 134/78  Pulse: 68  Temp: 98.2 F (36.8 C)  SpO2: 99%   BP Readings from Last 3 Encounters:  09/21/20 134/78  09/07/20 (!) 144/82  06/03/20 140/77   Wt Readings from Last 3 Encounters:  09/21/20 243 lb (110.2 kg)  09/07/20 245 lb (111.1 kg)  06/03/20 258 lb 9.6 oz (117.3 kg)   Body mass index is 44.45 kg/m.   Physical Exam Constitutional:      General: She is not in acute distress.    Appearance: Normal appearance. She is not ill-appearing.  HENT:     Head: Normocephalic and atraumatic.  Neurological:     General: No focal deficit present.     Mental Status: She is alert.  Psychiatric:  Mood and Affect: Mood normal.        Behavior: Behavior normal.     Comments: Her thought content and judgment are not normal.  She does display some paranoia about her neighbors and items in her apartment.  She also discusses her hallucinations            Assessment & Plan:    I spent 20 minutes dedicated to the  care of this patient on the date of this encounter including review of recent labs, imaging and procedures, speciality notes, obtaining history, communicating with the patient and her 2 daughters, reviewing and discussing medications, tests, and documenting clinical information in the EHR   See Problem List for Assessment and Plan of chronic medical problems.    This visit occurred during the SARS-CoV-2 public health emergency.  Safety protocols were in place, including screening questions prior to the visit, additional usage of staff PPE, and extensive cleaning of exam room while observing appropriate contact time as indicated for disinfecting solutions.

## 2020-09-21 ENCOUNTER — Ambulatory Visit (INDEPENDENT_AMBULATORY_CARE_PROVIDER_SITE_OTHER): Payer: Medicare (Managed Care) | Admitting: Internal Medicine

## 2020-09-21 ENCOUNTER — Other Ambulatory Visit: Payer: Self-pay

## 2020-09-21 ENCOUNTER — Encounter: Payer: Self-pay | Admitting: Internal Medicine

## 2020-09-21 VITALS — BP 134/78 | HR 68 | Temp 98.2°F | Ht 62.0 in | Wt 243.0 lb

## 2020-09-21 DIAGNOSIS — R410 Disorientation, unspecified: Secondary | ICD-10-CM

## 2020-09-21 DIAGNOSIS — R441 Visual hallucinations: Secondary | ICD-10-CM

## 2020-09-21 DIAGNOSIS — F29 Unspecified psychosis not due to a substance or known physiological condition: Secondary | ICD-10-CM

## 2020-09-21 NOTE — Assessment & Plan Note (Signed)
Subacute Having visual hallucinations and paranoia about her neighbors Has called the police 5-6 times in the past month ?  When her symptoms really started-her daughter is thinking maybe 2 months ago She thinks it started when her Adderall was started, but it looks like she was on that for a longer time.-The dose was increased from in January She has been off the Adderall and is still symptomatic I stopped her INH at her last visit She feels her symptoms may be slightly better, but it does not sound like they are Concerns over whether she is able to care for herself and may need some help at home Infection is ruled out at her last visit MRI of brain ordered-this had been scheduled, but I believe there is an issue with insurance and I will have someone check on this Discussed with her daughter that I do not think I will be able to help figure out what the cause of her hallucinations and paranoia/psychosis is Stressed follow-up with neurology and psychiatry  Has an appointment neurology 4/6 Advised that she needs to follow-up with her psychiatrist-she no showed her most recent appointment

## 2020-09-21 NOTE — Patient Instructions (Signed)
Check your medications at home.   Follow up with psychiatry.   See Dr Tomi Likens on 4/6.

## 2020-09-23 ENCOUNTER — Telehealth: Payer: Self-pay | Admitting: Internal Medicine

## 2020-09-23 DIAGNOSIS — Z0279 Encounter for issue of other medical certificate: Secondary | ICD-10-CM

## 2020-09-23 NOTE — Telephone Encounter (Signed)
Forms have been signed, Faxed to [facility] as requested, Copy sent to scan &Charged for.   Daughter informed and original mailed to patient for her records.

## 2020-09-23 NOTE — Telephone Encounter (Signed)
I received a DMA 3051 form for a new request.   Forms have been completed and Placed in providers box to review and sign.

## 2020-09-24 ENCOUNTER — Other Ambulatory Visit: Payer: Medicare (Managed Care)

## 2020-09-28 ENCOUNTER — Ambulatory Visit: Payer: Medicare Other | Admitting: Neurology

## 2020-09-30 ENCOUNTER — Telehealth: Payer: Self-pay | Admitting: Internal Medicine

## 2020-09-30 NOTE — Telephone Encounter (Signed)
Patients daughter, Darrick Penna called and was wondering if an MRI could be schedule for [place]. She said that the referral that the patient has now is in [place]. She can be reached at [phone]. Please advise

## 2020-10-06 ENCOUNTER — Encounter: Payer: Self-pay | Admitting: Internal Medicine

## 2020-10-17 NOTE — Telephone Encounter (Signed)
Patient called and is requesting a call back in regards to recent MRI results. She can be reached at [phone]. Please advise

## 2020-10-18 ENCOUNTER — Telehealth: Payer: Self-pay

## 2020-10-18 NOTE — Telephone Encounter (Signed)
Left message for patient to return call to clinic to go over MRI results.

## 2020-10-27 ENCOUNTER — Other Ambulatory Visit: Payer: Self-pay | Admitting: Internal Medicine

## 2020-10-30 NOTE — Progress Notes (Deleted)
NEUROLOGY FOLLOW UP OFFICE NOTE  Janet Le [id number]  Assessment/Plan:   1.  Myoclonus - *** 2.  Neuropathic symptoms - negative workup 3.  ***  Subjective:  Janet Le is a85 year old right-handed woman with hypertension, diabetes, TB, IBS, lymphedema, osteoarthritis and osteoporosis and history of renal cell carcinoma who follows up for neuropathy and myoclonus.  UPDATE: Current medications:  Venlafaxine XR 225mg , propranolol 80mg  BID, amlodipine, Lasix, isoniazid, B6  Underwent MRI of brain and cervical spine on 10/05/2020 which showed stable 11 mm left frontal convexity meningioma, minimal chronic small vessel disease, moderate cervical spondylosis from C4 through C7 and small central disc protrusion at C7-T1 but no acute intracranial abnormality or spinal stenosis or cord abnormality.  HISTORY: NEUROPATHY: In 2016, she started experiencing burning pain from the bottom of her feet up the back of her legs to the knees. It is intermittent and lasts for various and unknown period of time, but not aware of any triggers such as due to position. In addition, she is reporting increased back pain and spasms, worse when sitting and improved when laying supine. She has a history of chronic low back pain with left lumbar radiculopathy, as well as right knee pain. She has been seen and evaluated by orthopedics and pain management. MRI of lumbar spine from 07/27/16 revealed advanced L4-5 and L5-S1 facet arthrosis with some bilateral neural foraminal stenosis. Dr. Marlou Sa of orthopedics did not think surgery was an option at that time. TSH from 10/15/16 was 1.67. B12 from 10/18/16 was 537. Hgb A1c from 04/05/17 was 5.6. NCV-EMG from 10/25/16 demonstrated no evidence of large fiber sensorimotor polyneuropathy or lumbosacral radiculopathy.   Other History: VISUAL DISTURBANCE: Symptoms started in 2014. She describes initially seeing a line of light along the bottom of her visual  field in the left eye. She then developed what looks like bubbles floating up in the temporal aspect of the visual fields of in both eyes. It lasts only a few seconds and occurs several times a day. There is no vision loss. There is no associated headache. There was a concern for diabetic retinopathy. She saw Dr. Katy Fitch, an ophthalmologist on 11/02/13. The exam did not reveal diabetic retinopathy, but a cardiac evaluation was recommended. Possibly ocular migraines.  DIZZINESS: Also in 2014, she began feeling dizzy. She describes it as a feeling of lightheadedness, as if she is going to pass out. There is no spinning sensation. She sometimes hears "static" during these spells. She has been found to have low blood pressure and is orthostatic. She also began feeling diffuse pain in her joints. Uric acid level was 11.4. HCTZ was discontinued nd she was placed on colchicine.  HAND TREMORS/MYOCLONUS:  Since early 2019, she notes that her hands will start moving spontaneously. It has been described as both a tremor and a jerking movement.  It occurs when she is using her hands in particular position. For example, if she is holding her cell phone with her index finger on the screen, her index finger will start to tremor or even start tapping. She notices that her hand will shake a bit if she is holding a utensil. It does not occur when her hands are completely at rest, such as laying flat on her lap. She has some numbness in the hands related to her neuropathy, but there is no associated pain or weakness. She started having brief jerks of other parts of her body as well.  MEMORY DEFICITS: Started in 2018. She reports  misplacing objects or forgetting things she said. Sometimes when she is cooking, she may do something that wasn't required by the recipe. She reports anxiety while driving but no disorientation on familiar routes. She has forgotten to pay bills but then remembers and is able  to pay within the grace period. She sometimes has trouble recalling her grandchildren's names for a moment. She does have depression and anxiety. Labs from May 2019 included normal B12 528 and TSH 1.37. Her mother had Alzheimer's. She also reports tremors or abnormal movements involving all of her body.  She underwent neuropsychological testing 07/09/18. She demonstrated probable deficits of processing speed, executive function, visual memory significantly more than verbal memory, and Ecologist. It was noted that such a profile may be seen in early Parkinson's disease or a Parkinson's plus syndrome. However, her performance may have been skewed due to observed anxiety and mildly suboptimal task persistence. It was noted that her symptoms may conceivably be somatoform and may be due to premorbid ADHD, emotional stress and physical disability. Regardless, she did not meet criteria for any type of dementia. In my opinion, I believe that her cognitive deficits are likely related to psychological factors. I have examined her on multiple occasions and she does not exhibit any symptoms of parkinsonism on exam.   MRI of brain without contrast from 12/19/13 demonstrated incidental small 11 mm dural calcification but nothing concerning.  PAST MEDICAL HISTORY: Past Medical History:  Diagnosis Date  . Allergic rhinitis, cause unspecified   . Allergy   . Anxiety   . Blood dyscrasia    platelets low in past  . Cataract   . Chronic back pain   . Chronic constipation   . Chronic sinusitis   . Depression   . Endometriosis   . ENDOMETRIOSIS 05/06/2009   Qualifier: Diagnosis of  By: Varney Daily RN, Butch Penny    . Esophageal stricture   . GERD (gastroesophageal reflux disease)   . Hemorrhoids   . Hiatal hernia   . Hyperlipidemia   . Hypertension   . Irritable bowel syndrome   . Lymphedema   . Osteoarthritis   . Personal history of diabetes mellitus    controlled with diet only  . Renal cell  cancer ([place]) 11/2010 dx   R, s/p cryoablation 02/16/11  . Syncopal episodes    since childhood    MEDICATIONS: Current Outpatient Medications on File Prior to Visit  Medication Sig Dispense Refill  . Accu-Chek FastClix Lancets MISC USE AS DIRECTED UP  TO  1 TIME  DAILY- DX CODE R73.03 100 each 1  . acetaminophen (TYLENOL) 500 MG tablet Take 500 mg by mouth every 6 (six) hours as needed.    [name] amLODipine (NORVASC) 5 MG tablet Take 1 tablet by mouth once daily 90 tablet 0  . atorvastatin (LIPITOR) 10 MG tablet Take 1 tablet by mouth once daily 90 tablet 0  . clotrimazole (LOTRIMIN) 1 % cream Apply topically 2 (two) times daily.    [name] dicyclomine (BENTYL) 10 MG capsule TAKE 1 TO 2 CAPSULES BY MOUTH THREE TIMES DAILY AS NEEDED **NEEDS  APPOINTMENT  FOR  FURTHER  REFILLS** 150 capsule 0  . DULoxetine (CYMBALTA) 60 MG capsule Take 1 capsule (60 mg total) by mouth daily. 30 capsule 1  . furosemide (LASIX) 20 MG tablet Take 1 tablet by mouth once daily 90 tablet 0  . glucose blood (ACCU-CHEK GUIDE) test strip Use to check sugars once daily. Dx Code-R73.03 100 each 1  . Rolan Lipa  145 MCG CAPS capsule TAKE ONE CAPSULE BY MOUTH ONCE DAILY 30 capsule 1  . methotrexate 2.5 MG tablet     . Misc Natural Products (HEALTHY LIVER PO) Take by mouth.    . modafinil (PROVIGIL) 100 MG tablet Take 1 tablet (100 mg total) by mouth daily. 30 tablet 0  . Multiple Vitamin (MULTIVITAMIN) tablet Take 1 tablet by mouth daily.    . Probiotic Product (PROBIOTIC-10 PO) Take 6 Billion Cells by mouth.    . propranolol (INDERAL) 80 MG tablet Take 1 tablet by mouth twice daily 180 tablet 0  . pyridOXINE (VITAMIN B-6) 50 MG tablet Take 1 tablet (50 mg total) by mouth daily. 30 tablet 8  . telmisartan (MICARDIS) 80 MG tablet Take 1 tablet by mouth once daily 90 tablet 0   No current facility-administered medications on file prior to visit.    ALLERGIES: Allergies  Allergen Reactions  . Sertraline Hcl Other (See Comments)     Spasms; numbness  . Ace Inhibitors Cough  . Codeine Palpitations  . Darifenacin Hydrobromide Er Other (See Comments)    Pt states made her feel queezy & drunk  . Gabapentin Other (See Comments)    Hallucinations  . Pravastatin Other (See Comments)    myalgias  . Propoxyphene Hcl Palpitations  . Wellbutrin [Bupropion Hcl] Other (See Comments)    Numbness of mouth/lips    FAMILY HISTORY: Family History  Problem Relation Age of Onset  . Diabetes Mother   . Hypertension Mother   . Hyperlipidemia Mother   . Heart disease Mother   . Stroke Mother   . Kidney disease Mother   . Thyroid disease Mother   . Sleep apnea Mother   . Obesity Mother   . Kidney disease Father   . Prostate cancer Father   . Alcoholism Father   . Alcohol abuse Father   . Colitis Brother   . Alcohol abuse Brother   . Leukemia Brother   . Multiple sclerosis Sister   . Colon polyps Other   . Breast cancer Maternal Aunt   . Breast cancer Cousin   . Alcohol abuse Daughter   . Colon cancer Neg Hx   . Esophageal cancer Neg Hx   . Pancreatic cancer Neg Hx   . Rectal cancer Neg Hx   . Stomach cancer Neg Hx       Objective:  *** General: No acute distress.  Patient appears well-groomed.   Head:  Normocephalic/atraumatic Eyes:  Fundi examined but not visualized Neck: supple, no paraspinal tenderness, full range of motion Heart:  Regular rate and rhythm Lungs:  Clear to auscultation bilaterally Back: No paraspinal tenderness Neurological Exam: alert and oriented to person, place, and time. Attention span and concentration intact, recent and remote memory intact, fund of knowledge intact.  Speech fluent and not dysarthric, language intact.  CN II-XII intact. Bulk and tone normal, muscle strength 5/5 throughout.  Sensation to light touch, temperature and vibration intact.  Deep tendon reflexes 2+ throughout, toes downgoing.  Finger to nose and heel to shin testing intact.  Gait normal, Romberg  negative.     Metta Clines, DO  CC: Billey Gosling, MD

## 2020-10-31 ENCOUNTER — Ambulatory Visit: Payer: Medicare (Managed Care) | Admitting: Neurology

## 2020-11-01 NOTE — Progress Notes (Signed)
NEUROLOGY FOLLOW UP OFFICE NOTE  Janet Le [id number]  Assessment/Plan:   1.  Cognitive deficits with frequent falls and recent episodes of formed hallucinations.  Given previous neuropsychological test suggestive of parkinsonian profile, consider dementia with Lewy body disease.  She endorses slight tremor but no significant tremor, bradykinesia or rigidity noted on exam. 2.  Numbness and tingling - workup thus far negative.  Will re-evaluate.  1.  Check ANA with reflex, sed rate, SPEP/IFE, ACE 2.  Check nerve study of right arm and leg.  If negative, then will send you for a punch skin biopsy 3.  Neurocognitive testing 4.  Follow up after testing.  Subjective:  Janet Le is a79 year old right-handed woman with hypertension, diabetes, TB, IBS, lymphedema, osteoarthritis and osteoporosis and history of renal cell carcinoma who follows up for neuropathy and confusion.  She is accompanied by her friend, Kennyth Lose, who supplements history.  UPDATE: Current medications:  duloxetine 60mg , propranolol 80mg  BID, amlodipine, Lasix  Underwent MRI of brain and cervical spine on 10/05/2020 which showed stable 11 mm left frontal convexity meningioma, minimal chronic small vessel disease, moderate cervical spondylosis from C4 through C7 and small central disc protrusion at C7-T1 but no acute intracranial abnormality or spinal stenosis or cord abnormality.  In March, she was confused and hallucinating.  She was looking out of the house at her car and looked like there was somebody in her car.  When she went out to her car, nobody was there.  She started seeing people, strangers, not there in her house. She said that they were coming in through the walls.  She thinks it was medication-related and Effexor was changed to Cymbalta.  She says she is less anxious and has not hallucinated since end of March.  She was at a store and said the clerk took more medication out of account   Still notes  numbness, tingling and burning in hands and legs.  Still with slight tremor in hands but not significant.  She has had 8 falls since last visit.  HISTORY: NEUROPATHY: In 2016, she started experiencing burning pain from the bottom of her feet up the back of her legs to the knees. It is intermittent and lasts for various and unknown period of time, but not aware of any triggers such as due to position. In addition, she is reporting increased back pain and spasms, worse when sitting and improved when laying supine. She has a history of chronic low back pain with left lumbar radiculopathy, as well as right knee pain. She has been seen and evaluated by orthopedics and pain management. MRI of lumbar spine from 07/27/16 revealed advanced L4-5 and L5-S1 facet arthrosis with some bilateral neural foraminal stenosis. Dr. Marlou Sa of orthopedics did not think surgery was an option at that time. TSH from 10/15/16 was 1.67. B12 from 10/18/16 was 537. Hgb A1c from 04/05/17 was 5.6. NCV-EMG from 10/25/16 demonstrated no evidence of large fiber sensorimotor polyneuropathy or lumbosacral radiculopathy.   Other History: VISUAL DISTURBANCE: Symptoms started in 2014. She describes initially seeing a line of light along the bottom of her visual field in the left eye. She then developed what looks like bubbles floating up in the temporal aspect of the visual fields of in both eyes. It lasts only a few seconds and occurs several times a day. There is no vision loss. There is no associated headache. There was a concern for diabetic retinopathy. She saw Dr. Katy Fitch, an ophthalmologist on 11/02/13. The exam  did not reveal diabetic retinopathy, but a cardiac evaluation was recommended. Possibly ocular migraines.  DIZZINESS: Also in 2014, she began feeling dizzy. She describes it as a feeling of lightheadedness, as if she is going to pass out. There is no spinning sensation. She sometimes hears "static" during these  spells. She has been found to have low blood pressure and is orthostatic. She also began feeling diffuse pain in her joints. Uric acid level was 11.4. HCTZ was discontinued nd she was placed on colchicine.  HAND TREMORS:  Since early 2019, she notes that her hands will start moving spontaneously. It has been described as both a tremor and a jerking movement.  It occurs when she is using her hands in particular position. For example, if she is holding her cell phone with her index finger on the screen, her index finger will start to tremor or even start tapping. She notices that her hand will shake a bit if she is holding a utensil. It does not occur when her hands are completely at rest, such as laying flat on her lap. She has some numbness in the hands related to her neuropathy, but there is no associated pain or weakness. She started having brief jerks of other parts of her body as well.  MEMORY DEFICITS: Started in 2018. She reports misplacing objects or forgetting things she said. Sometimes when she is cooking, she may do something that wasn't required by the recipe. She reports anxiety while driving but no disorientation on familiar routes. She has forgotten to pay bills but then remembers and is able to pay within the grace period. She sometimes has trouble recalling her grandchildren's names for a moment. She does have depression and anxiety. Labs from May 2019 included normal B12 528 and TSH 1.37. She has been treated for latent TB.  Her mother had Alzheimer's. She also reports tremors or abnormal movements involving all of her body.  She underwent neuropsychological testing 07/09/18. She demonstrated probable deficits of processing speed, executive function, visual memory significantly more than verbal memory, and Ecologist. It was noted that such a profile may be seen in early Parkinson's disease or a Parkinson's plus syndrome. However, her performance may have  been skewed due to observed anxiety and mildly suboptimal task persistence. It was noted that her symptoms may conceivably be somatoform and may be due to premorbid ADHD, emotional stress and physical disability.   MRI of brain without contrast from 12/19/13 demonstrated incidental small 11 mm dural calcification but nothing concerning. MS - sister,   PAST MEDICAL HISTORY: Past Medical History:  Diagnosis Date  . Allergic rhinitis, cause unspecified   . Allergy   . Anxiety   . Blood dyscrasia    platelets low in past  . Cataract   . Chronic back pain   . Chronic constipation   . Chronic sinusitis   . Depression   . Endometriosis   . ENDOMETRIOSIS 05/06/2009   Qualifier: Diagnosis of  By: Varney Daily RN, Butch Penny    . Esophageal stricture   . GERD (gastroesophageal reflux disease)   . Hemorrhoids   . Hiatal hernia   . Hyperlipidemia   . Hypertension   . Irritable bowel syndrome   . Lymphedema   . Osteoarthritis   . Personal history of diabetes mellitus    controlled with diet only  . Renal cell cancer ([place]) 11/2010 dx   R, s/p cryoablation 02/16/11  . Syncopal episodes    since childhood    MEDICATIONS:  Current Outpatient Medications on File Prior to Visit  Medication Sig Dispense Refill  . Accu-Chek FastClix Lancets MISC USE AS DIRECTED UP  TO  1 TIME  DAILY- DX CODE R73.03 100 each 1  . acetaminophen (TYLENOL) 500 MG tablet Take 500 mg by mouth every 6 (six) hours as needed.    [name] amLODipine (NORVASC) 5 MG tablet Take 1 tablet by mouth once daily 90 tablet 0  . atorvastatin (LIPITOR) 10 MG tablet Take 1 tablet by mouth once daily 90 tablet 0  . clotrimazole (LOTRIMIN) 1 % cream Apply topically 2 (two) times daily.    [name] dicyclomine (BENTYL) 10 MG capsule TAKE 1 TO 2 CAPSULES BY MOUTH THREE TIMES DAILY AS NEEDED **NEEDS  APPOINTMENT  FOR  FURTHER  REFILLS** 150 capsule 0  . DULoxetine (CYMBALTA) 60 MG capsule Take 1 capsule (60 mg total) by mouth daily. 30 capsule 1  .  furosemide (LASIX) 20 MG tablet Take 1 tablet by mouth once daily 90 tablet 0  . glucose blood (ACCU-CHEK GUIDE) test strip Use to check sugars once daily. Dx Code-R73.03 100 each 1  . LINZESS 145 MCG CAPS capsule TAKE ONE CAPSULE BY MOUTH ONCE DAILY 30 capsule 1  . methotrexate 2.5 MG tablet     . Misc Natural Products (HEALTHY LIVER PO) Take by mouth.    . modafinil (PROVIGIL) 100 MG tablet Take 1 tablet (100 mg total) by mouth daily. 30 tablet 0  . Multiple Vitamin (MULTIVITAMIN) tablet Take 1 tablet by mouth daily.    . Probiotic Product (PROBIOTIC-10 PO) Take 6 Billion Cells by mouth.    . propranolol (INDERAL) 80 MG tablet Take 1 tablet by mouth twice daily 180 tablet 0  . pyridOXINE (VITAMIN B-6) 50 MG tablet Take 1 tablet (50 mg total) by mouth daily. 30 tablet 8  . telmisartan (MICARDIS) 80 MG tablet Take 1 tablet by mouth once daily 90 tablet 0   No current facility-administered medications on file prior to visit.    ALLERGIES: Allergies  Allergen Reactions  . Sertraline Hcl Other (See Comments)    Spasms; numbness  . Ace Inhibitors Cough  . Codeine Palpitations  . Darifenacin Hydrobromide Er Other (See Comments)    Pt states made her feel queezy & drunk  . Gabapentin Other (See Comments)    Hallucinations  . Pravastatin Other (See Comments)    myalgias  . Propoxyphene Hcl Palpitations  . Wellbutrin [Bupropion Hcl] Other (See Comments)    Numbness of mouth/lips    FAMILY HISTORY: Family History  Problem Relation Age of Onset  . Diabetes Mother   . Hypertension Mother   . Hyperlipidemia Mother   . Heart disease Mother   . Stroke Mother   . Kidney disease Mother   . Thyroid disease Mother   . Sleep apnea Mother   . Obesity Mother   . Kidney disease Father   . Prostate cancer Father   . Alcoholism Father   . Alcohol abuse Father   . Colitis Brother   . Alcohol abuse Brother   . Leukemia Brother   . Multiple sclerosis Sister   . Colon polyps Other   .  Breast cancer Maternal Aunt   . Breast cancer Cousin   . Alcohol abuse Daughter   . Colon cancer Neg Hx   . Esophageal cancer Neg Hx   . Pancreatic cancer Neg Hx   . Rectal cancer Neg Hx   . Stomach cancer Neg Hx  Objective:  Blood pressure (!) 149/88, pulse 67, height 5\' 3"  (1.6 m), weight 243 lb (110.2 kg), SpO2 97 %. General: No acute distress.  Patient appears well-groomed.   Head:  Normocephalic/atraumatic Eyes:  Fundi examined but not visualized Neck: supple, no paraspinal tenderness, full range of motion Heart:  Regular rate and rhythm Lungs:  Clear to auscultation bilaterally Back: No paraspinal tenderness Neurological Exam: alert and oriented to person, place, and time. Speech fluent and not dysarthric, language intact.  CN II-XII intact. Bulk and tone normal, muscle strength 5/5 throughout.  Sensation to pinprick and vibration intact.  Deep tendon reflexes 1+ throughout, toes downgoing.  Finger to nose testing intact.  Wide-based cautious gait.  Uses cane.    Metta Clines, DO  CC: Billey Gosling, MD

## 2020-11-02 ENCOUNTER — Encounter: Payer: Self-pay | Admitting: Neurology

## 2020-11-02 ENCOUNTER — Other Ambulatory Visit: Payer: Self-pay

## 2020-11-02 ENCOUNTER — Other Ambulatory Visit (INDEPENDENT_AMBULATORY_CARE_PROVIDER_SITE_OTHER): Payer: Medicare (Managed Care)

## 2020-11-02 ENCOUNTER — Other Ambulatory Visit: Payer: Medicare (Managed Care)

## 2020-11-02 ENCOUNTER — Ambulatory Visit (INDEPENDENT_AMBULATORY_CARE_PROVIDER_SITE_OTHER): Payer: Medicare (Managed Care) | Admitting: Neurology

## 2020-11-02 VITALS — BP 149/88 | HR 67 | Ht 63.0 in | Wt 243.0 lb

## 2020-11-02 DIAGNOSIS — R4189 Other symptoms and signs involving cognitive functions and awareness: Secondary | ICD-10-CM

## 2020-11-02 DIAGNOSIS — R441 Visual hallucinations: Secondary | ICD-10-CM

## 2020-11-02 DIAGNOSIS — R200 Anesthesia of skin: Secondary | ICD-10-CM | POA: Diagnosis not present

## 2020-11-02 DIAGNOSIS — R202 Paresthesia of skin: Secondary | ICD-10-CM | POA: Diagnosis not present

## 2020-11-02 NOTE — Patient Instructions (Signed)
1.  Check ANA with reflex, sed rate, SPEP/IFE, ACE 2.  Check nerve study of right arm and leg.  If negative, then will send you for a punch skin biopsy 3.  Neurocognitive testing 4.  Follow up after testing.

## 2020-11-03 ENCOUNTER — Other Ambulatory Visit: Payer: Self-pay | Admitting: Internal Medicine

## 2020-11-03 ENCOUNTER — Encounter: Payer: Self-pay | Admitting: Neurology

## 2020-11-03 LAB — ANA W/REFLEX: Anti Nuclear Antibody (ANA): NEGATIVE

## 2020-11-03 LAB — SEDIMENTATION RATE: Sed Rate: 16 mm/hr (ref 0–30)

## 2020-11-04 ENCOUNTER — Telehealth: Payer: Self-pay

## 2020-11-04 LAB — PROTEIN ELECTROPHORESIS, SERUM
Albumin ELP: 3.9 g/dL (ref 3.8–4.8)
Alpha 1: 0.3 g/dL (ref 0.2–0.3)
Alpha 2: 0.8 g/dL (ref 0.5–0.9)
Beta 2: 0.4 g/dL (ref 0.2–0.5)
Beta Globulin: 0.4 g/dL (ref 0.4–0.6)
Gamma Globulin: 1.3 g/dL (ref 0.8–1.7)
Total Protein: 7 g/dL (ref 6.1–8.1)

## 2020-11-04 LAB — IMMUNOFIXATION ELECTROPHORESIS
IgG (Immunoglobin G), Serum: 1568 mg/dL — ABNORMAL HIGH (ref 600–1540)
IgM, Serum: 78 mg/dL (ref 50–300)
Immunofix Electr Int: NOT DETECTED
Immunoglobulin A: 119 mg/dL (ref 70–320)

## 2020-11-04 LAB — ANGIOTENSIN CONVERTING ENZYME: Angiotensin-Converting Enzyme: 23 U/L (ref 9–67)

## 2020-11-04 NOTE — Telephone Encounter (Signed)
Pt called no answer unable to leave a voice mail  

## 2020-11-04 NOTE — Telephone Encounter (Signed)
-----   Message from Pieter Partridge, DO sent at 11/04/2020 11:26 AM EDT ----- Labs are unremarkable

## 2020-11-08 ENCOUNTER — Encounter: Payer: Medicare (Managed Care) | Admitting: Neurology

## 2020-11-22 ENCOUNTER — Other Ambulatory Visit: Payer: Self-pay

## 2020-11-22 ENCOUNTER — Ambulatory Visit (INDEPENDENT_AMBULATORY_CARE_PROVIDER_SITE_OTHER): Payer: Medicare (Managed Care) | Admitting: Neurology

## 2020-11-22 DIAGNOSIS — R200 Anesthesia of skin: Secondary | ICD-10-CM | POA: Diagnosis not present

## 2020-11-22 DIAGNOSIS — G629 Polyneuropathy, unspecified: Secondary | ICD-10-CM

## 2020-11-22 DIAGNOSIS — R202 Paresthesia of skin: Secondary | ICD-10-CM

## 2020-11-22 NOTE — Procedures (Signed)
[facility]  [place], [address] [place]  [place], [place] [address] Tel: [phone] Fax:  [phone] Test Date:  11/22/2020  Patient: Janet Le DOB: 1955/10/21 Physician: Narda Amber, DO  Sex: Female Height: 5\' 3"  Ref Phys: Metta Clines, D.O.  ID#: [id number]   Technician:    Patient Complaints: This is a 65 year old female referred for evaluation of right arm and leg paresthesias.  NCV & EMG Findings: Extensive electrodiagnostic testing of the right upper and lower extremity shows:  1. Right median, ulnar, and mixed palmar sensory responses are within normal limits.  Right sural and superficial peroneal sensory responses are absent.   2. Right median and ulnar motor responses are within normal limits.  Right peroneal (EDB) and tibial motor responses are absent.  Right peroneal motor response at the tibialis anterior is within normal limits. 3. Right tibial H reflex study is within normal limits. 4. There is no evidence of active or chronic motor axonal loss changes affecting any of the tested muscles.  Motor unit configuration and recruitment pattern is within normal limits.  Impression: 1. 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 2. There is no evidence of a right cervical radiculopathy or carpal tunnel syndrome.   ___________________________ Narda Amber, DO    Nerve Conduction Studies Anti Sensory Summary Table   Stim Site NR Peak (ms) Norm Peak (ms) P-T Amp (V) Norm P-T Amp  Right Median Anti Sensory (2nd Digit)  34C  Wrist    2.8 <3.8 30.3 >10  Right Sup Peroneal Anti Sensory (Ant Lat Mall)  34C  12 cm NR  <4.6  >3  Right Sural Anti Sensory (Lat Mall)  34C  Calf NR  <4.6  >3  Right Ulnar Anti Sensory (5th Digit)  34C  Wrist    2.5 <3.2 14.9 >5   Motor Summary Table   Stim Site NR Onset (ms) Norm Onset  (ms) O-P Amp (mV) Norm O-P Amp Site1 Site2 Delta-0 (ms) Dist (cm) Vel (m/s) Norm Vel (m/s)  Right Median Motor (Abd Poll Brev)  34C  Wrist    2.8 <4.0 7.3 >5 Elbow Wrist 5.2 28.0 54 >50  Elbow    8.0  7.1         Right Peroneal Motor (Ext Dig Brev)  34C  Ankle NR  <6.0  >2.5 B Fib Ankle  0.0  >40  B Fib NR     Poplt B Fib  0.0  >40  Poplt NR            Right Peroneal TA Motor (Tib Ant)  34C  Fib Head    2.3 <4.5 5.1 >3 Poplit Fib Head 1.8 8.0 44 >40  Poplit    4.1  5.1         Right Tibial Motor (Abd Hall Brev)  34C  Ankle NR  <6.0  >4 Knee Ankle  0.0  >40  Knee NR            Right Ulnar Motor (Abd Dig Minimi)  34C  Wrist    1.6 <3.1 7.1 >7 B Elbow Wrist 4.3 25.0 58 >50  B Elbow    5.9  6.5  A Elbow B Elbow 1.9 10.0 53 >50  A Elbow    7.8  6.3          Comparison Summary Table   Stim Site NR Peak (  ms) Norm Peak (ms) P-T Amp (V) Site1 Site2 Delta-P (ms) Norm Delta (ms)  Right Median/Ulnar Palm Comparison (Wrist - 8cm)  34C  Median Palm    1.7 <2.2 28.5 Median Palm Ulnar Palm 0.2   Ulnar Palm    1.5 <2.2 11.6       H Reflex Studies   NR H-Lat (ms) Lat Norm (ms) L-R H-Lat (ms)  Right Tibial (Gastroc)  34C     29.93 <35    EMG   Side Muscle Ins Act Fibs Psw Fasc Number Recrt Dur Dur. Amp Amp. Poly Poly. Comment  Right AntTibialis Nml Nml Nml Nml Nml Nml Nml Nml Nml Nml Nml Nml N/A  Right Gastroc Nml Nml Nml Nml Nml Nml Nml Nml Nml Nml Nml Nml N/A  Right Flex Dig Long Nml Nml Nml Nml Nml Nml Nml Nml Nml Nml Nml Nml N/A  Right RectFemoris Nml Nml Nml Nml Nml Nml Nml Nml Nml Nml Nml Nml N/A  Right GluteusMed Nml Nml Nml Nml Nml Nml Nml Nml Nml Nml Nml Nml N/A  Right 1stDorInt Nml Nml Nml Nml Nml Nml Nml Nml Nml Nml Nml Nml N/A  Right PronatorTeres Nml Nml Nml Nml Nml Nml Nml Nml Nml Nml Nml Nml N/A  Right Biceps Nml Nml Nml Nml Nml Nml Nml Nml Nml Nml Nml Nml N/A  Right Triceps Nml Nml Nml Nml Nml Nml Nml Nml Nml Nml Nml Nml N/A  Right Deltoid Nml Nml Nml Nml Nml Nml Nml  Nml Nml Nml Nml Nml N/A      Waveforms:

## 2020-11-25 ENCOUNTER — Ambulatory Visit: Payer: Medicare Other | Admitting: Neurology

## 2020-12-02 ENCOUNTER — Telehealth (HOSPITAL_COMMUNITY): Payer: Self-pay | Admitting: *Deleted

## 2020-12-02 NOTE — Telephone Encounter (Signed)
Aware. Thanks.will let pharmacy know.

## 2020-12-02 NOTE — Telephone Encounter (Signed)
Former pt of Dr. Montel Culver requesting refill of Provigil 100 mg. Pt was no show on 09/15/20 however Rx was last ordered on 09/15/20 #30. Please review.

## 2020-12-02 NOTE — Telephone Encounter (Signed)
Plan: We will continue duloxetine 60  mg and change Adderall to modafinil low 100 mg dose for fatigue/ADD. She was advised to wait until [facility] resolve before starting modafinil. Next visit in two weeks. The plan was discussed with patient who had an opportunity to ask questions and these were all answered.  I spend 15 min in phone contact with the patient.         Stephanie Acre, MD 08/29/2020, 2:49 PM   Please see above note from Dr. Montel Culver.  Patient had a neurologist appointment and suggestive of Lewy body dementia/parkinsonism.  I would not recommend modafinil as patient continues to have visual hallucination which may get worse with modafinil.  She need to find a new provider for her psychotropic medication.

## 2020-12-20 ENCOUNTER — Telehealth (HOSPITAL_COMMUNITY): Payer: Self-pay

## 2020-12-20 NOTE — Telephone Encounter (Signed)
CERTIFIED LETTER AND LIST OF REFERRALS SENT TO PATIENT

## 2020-12-21 ENCOUNTER — Encounter: Payer: Medicare (Managed Care) | Admitting: Neurology

## 2020-12-26 ENCOUNTER — Other Ambulatory Visit: Payer: Self-pay | Admitting: Internal Medicine

## 2021-01-02 NOTE — Progress Notes (Addendum)
Subjective:    Patient ID: Janet Le, female    DOB: 06/18/1956, 65 y.o.   MRN: [id number]  HPI The patient is here for an acute visit.   Recent fall, pain rib/breast area - she fell 3 days ago.  She fell at home after tripping or slipping on a blanket on the floor.  She fell onto her couch arm that is wood.  She landed on the right anterior-lateral ribs.    Inc pain with deep breaths.  Movement hurts, but not as bad as deep breaths.  She has been taking tylenol.  And it helps.       Medications and allergies reviewed with patient and updated if appropriate.  Patient Active Problem List   Diagnosis Date Noted   Psychosis ([place]) 09/07/2020   Cervical radiculopathy 09/07/2020   Hallucination, visual 09/07/2020   Confusion 09/07/2020   TB lung, latent 11/25/2019   Blepharitis 11/07/2019   Nocturnal hypoxemia 05/11/2019   Dyspnea on exertion 05/11/2019   Excessive daytime sleepiness 05/11/2019   Primary osteoarthritis involving multiple joints 03/10/2019   Adult ADHD 01/08/2019   Chronic post-traumatic stress disorder (PTSD) 08/12/2018   Bilateral leg edema 06/12/2018   Fatigue 11/14/2017   Chronic back pain 11/14/2017   Arthralgia of hip 11/14/2017   Memory difficulties 10/08/2017   Anxiety and depression 10/08/2017   Neuropathy 10/07/2017   LVH (left ventricular hypertrophy), mild 05/18/2017   Arthritis of knee 10/30/2016   S/P knee surgery 10/30/2016   Prediabetes 05/30/2016   Cervicalgia 05/30/2016   Osteopenia 11/18/2015   Thrombocytopenia ([place]) 05/30/2015   Gout 12/11/2013   Morbid obesity ([place]) 11/06/2013   Cough 07/15/2012   Left lumbar radiculopathy 04/13/2012   Renal cell cancer ([place])    Hemorrhoids 11/14/2010   IBS (irritable bowel syndrome) with chronic constipation 11/14/2010   GERD (gastroesophageal reflux disease) 11/14/2010   HYPERCHOLESTEROLEMIA, MILD 03/16/2010   Lymphedema 03/15/2010   ALLERGIC RHINITIS 08/31/2009   MIGRAINE HEADACHE  06/14/2009   SINUSITIS, CHRONIC 06/14/2009   SYNCOPE 05/16/2009   Essential hypertension 05/06/2009    Current Outpatient Medications on File Prior to Visit  Medication Sig Dispense Refill   Accu-Chek FastClix Lancets MISC USE AS DIRECTED UP  TO  1 TIME  DAILY- DX CODE R73.03 100 each 1   acetaminophen (TYLENOL) 500 MG tablet Take 500 mg by mouth every 6 (six) hours as needed.     amLODipine (NORVASC) 5 MG tablet Take 1 tablet by mouth once daily 90 tablet 0   atorvastatin (LIPITOR) 10 MG tablet Take 1 tablet by mouth once daily 90 tablet 0   calcium carbonate (CALCIUM 600) 600 MG TABS tablet Take 600 mg by mouth 2 (two) times daily with a meal.     dicyclomine (BENTYL) 10 MG capsule TAKE 1 TO 2 CAPSULES BY MOUTH THREE TIMES DAILY AS NEEDED **NEEDS  APPOINTMENT  FOR  FURTHER  REFILLS** 150 capsule 0   furosemide (LASIX) 20 MG tablet Take 1 tablet by mouth once daily 90 tablet 0   glucose blood (ACCU-CHEK GUIDE) test strip Use to check sugars once daily. Dx Code-R73.03 100 each 1   LINZESS 145 MCG CAPS capsule TAKE ONE CAPSULE BY MOUTH ONCE DAILY 30 capsule 1   methotrexate 2.5 MG tablet      Misc Natural Products (HEALTHY LIVER PO) Take by mouth.     Multiple Vitamin (MULTIVITAMIN) tablet Take 1 tablet by mouth daily.     Probiotic Product (PROBIOTIC-10 PO) Take  6 Billion Cells by mouth.     propranolol (INDERAL) 80 MG tablet Take 1 tablet by mouth twice daily 180 tablet 0   pyridOXINE (VITAMIN B-6) 50 MG tablet Take 1 tablet (50 mg total) by mouth daily. 30 tablet 8   telmisartan (MICARDIS) 80 MG tablet Take 1 tablet by mouth once daily 90 tablet 0   clotrimazole (LOTRIMIN) 1 % cream Apply topically 2 (two) times daily. (Patient not taking: No sig reported)     DULoxetine (CYMBALTA) 60 MG capsule Take 1 capsule (60 mg total) by mouth daily. 30 capsule 1   modafinil (PROVIGIL) 100 MG tablet Take 1 tablet (100 mg total) by mouth daily. 30 tablet 0   No current facility-administered  medications on file prior to visit.    Past Medical History:  Diagnosis Date   Allergic rhinitis, cause unspecified    Allergy    Anxiety    Blood dyscrasia    platelets low in past   Cataract    Chronic back pain    Chronic constipation    Chronic sinusitis    Depression    Endometriosis    ENDOMETRIOSIS 05/06/2009   Qualifier: Diagnosis of  By: Surface RN, Butch Penny     Esophageal stricture    GERD (gastroesophageal reflux disease)    Hemorrhoids    Hiatal hernia    Hyperlipidemia    Hypertension    Irritable bowel syndrome    Lymphedema    Osteoarthritis    Personal history of diabetes mellitus    controlled with diet only   Renal cell cancer ([place]) 11/2010 dx   R, s/p cryoablation 02/16/11   Syncopal episodes    since childhood    Past Surgical History:  Procedure Laterality Date   BREAST CYST EXCISION Bilateral over 10 years ago   No visable scar    CATARACT EXTRACTION, BILATERAL Bilateral 2020   COLONOSCOPY     fallopian tube removed     FUNCTIONAL ENDOSCOPIC SINUS SURGERY     HEMORRHOID BANDING     LASER ABLATION OF THE CERVIX     NASAL SINUS SURGERY     RENAL CRYOABLATION  02/16/11   R kidney due to [place] (IR procedure)   TOTAL KNEE ARTHROPLASTY Right 10/30/2016   Procedure: RIGHT TOTAL KNEE ARTHROPLASTY;  Surgeon: Meredith Pel, MD;  Location: [facility];  Service: Orthopedics;  Laterality: Right;   TUBAL LIGATION     UMBILICAL HERNIA REPAIR      Social History   Socioeconomic History   Marital status: Legally Separated    Spouse name: Not on file   Number of children: 4   Years of education: Not on file   Highest education level: Not on file  Occupational History   Occupation: Disabled  Tobacco Use   Smoking status: Never   Smokeless tobacco: Never  Vaping Use   Vaping Use: Never used  Substance and Sexual Activity   Alcohol use: No    Comment: rare   Drug use: No   Sexual activity: Never  Other Topics Concern   Not on file  Social History  Narrative   Right handed   Social Determinants of Health   Financial Resource Strain: Not on file  Food Insecurity: Not on file  Transportation Needs: Not on file  Physical Activity: Not on file  Stress: Not on file  Social Connections: Not on file    Family History  Problem Relation Age of Onset   Diabetes Mother  Hypertension Mother    Hyperlipidemia Mother    Heart disease Mother    Stroke Mother    Kidney disease Mother    Thyroid disease Mother    Sleep apnea Mother    Obesity Mother    Kidney disease Father    Prostate cancer Father    Alcoholism Father    Alcohol abuse Father    Colitis Brother    Alcohol abuse Brother    Leukemia Brother    Multiple sclerosis Sister    Colon polyps Other    Breast cancer Maternal Aunt    Breast cancer Cousin    Alcohol abuse Daughter    Colon cancer Neg Hx    Esophageal cancer Neg Hx    Pancreatic cancer Neg Hx    Rectal cancer Neg Hx    Stomach cancer Neg Hx     Review of Systems  Constitutional:  Negative for chills and fever.  Respiratory:  Positive for shortness of breath (with heat/humid). Negative for cough and wheezing.   Cardiovascular:  Positive for chest pain (rib pain).      Objective:   Vitals:   01/03/21 1400  BP: (!) 142/76  Pulse: 73  Temp: 97.6 F (36.4 C)  SpO2: 99%   BP Readings from Last 3 Encounters:  01/03/21 (!) 142/76  11/03/20 (!) 149/88  09/21/20 134/78   Wt Readings from Last 3 Encounters:  01/03/21 227 lb (103 kg)  11/03/20 243 lb (110.2 kg)  09/21/20 243 lb (110.2 kg)   Body mass index is 40.21 kg/m.   Physical Exam Constitutional:      General: She is not in acute distress.    Appearance: Normal appearance. She is not ill-appearing.  HENT:     Head: Normocephalic and atraumatic.  Cardiovascular:     Rate and Rhythm: Normal rate and regular rhythm.  Pulmonary:     Effort: Pulmonary effort is normal. No respiratory distress.     Breath sounds: No wheezing or rales.   Chest:     Chest wall: Tenderness (Tenderness left anterior mid ribs and left lower-lateral breast to palpation, no swelling) present.  Skin:    General: Skin is warm and dry.     Findings: No bruising, erythema or rash.  Neurological:     Mental Status: She is alert.           Assessment & Plan:    See Problem List for Assessment and Plan of chronic medical problems.    This visit occurred during the SARS-CoV-2 public health emergency.  Safety protocols were in place, including screening questions prior to the visit, additional usage of staff PPE, and extensive cleaning of exam room while observing appropriate contact time as indicated for disinfecting solutions.

## 2021-01-03 ENCOUNTER — Ambulatory Visit (INDEPENDENT_AMBULATORY_CARE_PROVIDER_SITE_OTHER): Payer: 59 | Admitting: Internal Medicine

## 2021-01-03 ENCOUNTER — Other Ambulatory Visit: Payer: Self-pay | Admitting: Internal Medicine

## 2021-01-03 ENCOUNTER — Other Ambulatory Visit: Payer: Self-pay

## 2021-01-03 ENCOUNTER — Encounter: Payer: Self-pay | Admitting: Internal Medicine

## 2021-01-03 ENCOUNTER — Ambulatory Visit (INDEPENDENT_AMBULATORY_CARE_PROVIDER_SITE_OTHER): Payer: Medicare (Managed Care)

## 2021-01-03 VITALS — BP 142/76 | HR 73 | Temp 97.6°F | Ht 63.0 in | Wt 227.0 lb

## 2021-01-03 DIAGNOSIS — L292 Pruritus vulvae: Secondary | ICD-10-CM | POA: Diagnosis not present

## 2021-01-03 DIAGNOSIS — K581 Irritable bowel syndrome with constipation: Secondary | ICD-10-CM | POA: Diagnosis not present

## 2021-01-03 DIAGNOSIS — F29 Unspecified psychosis not due to a substance or known physiological condition: Secondary | ICD-10-CM

## 2021-01-03 DIAGNOSIS — R0781 Pleurodynia: Secondary | ICD-10-CM

## 2021-01-03 DIAGNOSIS — F419 Anxiety disorder, unspecified: Secondary | ICD-10-CM | POA: Diagnosis not present

## 2021-01-03 DIAGNOSIS — R441 Visual hallucinations: Secondary | ICD-10-CM

## 2021-01-03 DIAGNOSIS — F32A Depression, unspecified: Secondary | ICD-10-CM

## 2021-01-03 DIAGNOSIS — I10 Essential (primary) hypertension: Secondary | ICD-10-CM

## 2021-01-03 HISTORY — DX: Pruritus vulvae: L29.2

## 2021-01-03 HISTORY — DX: Pleurodynia: R07.81

## 2021-01-03 MED ORDER — LINACLOTIDE 145 MCG PO CAPS: 145.0000 ug | ORAL_CAPSULE | Freq: Every day | ORAL | 0 refills | Status: DC

## 2021-01-03 MED ORDER — DICYCLOMINE HCL 10 MG PO CAPS: ORAL_CAPSULE | ORAL | 0 refills | Status: DC

## 2021-01-03 NOTE — Assessment & Plan Note (Signed)
Acute Fell onto hard surface onto her left ribs-likely just contusion, doubt fracture Will check x-ray of ribs and chest Continue Tylenol as needed

## 2021-01-03 NOTE — Patient Instructions (Addendum)
    Have an xray downstairs.   Medications changes include :   none.  Continue Tylenol - you can take up to 3000 mg in one day.      A referral was ordered for social work for [employer].      Someone from their office will call you to schedule an appointment.    A referral was ordered for Gyn.      Someone from their office will call you to schedule an appointment.

## 2021-01-03 NOTE — Assessment & Plan Note (Signed)
Referred to GYN-states she needs to see a different gynecologist ?  Lichen sclerosis

## 2021-01-03 NOTE — Assessment & Plan Note (Signed)
Chronic Management per psychiatry 

## 2021-01-03 NOTE — Assessment & Plan Note (Signed)
As above.

## 2021-01-03 NOTE — Assessment & Plan Note (Signed)
Chronic BP Readings from Last 3 Encounters:  01/03/21 (!) 142/76  11/03/20 (!) 149/88  09/21/20 134/78   Blood pressure fairly controlled here today Continue propranolol 80 mg twice daily, telmisartan 80 mg daily, furosemide 20 mg daily and amlodipine 5 mg daily

## 2021-01-03 NOTE — Assessment & Plan Note (Addendum)
Chronic Overdue for follow-up with GI, but requested refills of medication Controlled Refilled Linzess 145 mcg daily, dicyclomine 10 mg 1-2 caps 3 times daily as needed Will schedule follow-up with GI

## 2021-01-03 NOTE — Assessment & Plan Note (Signed)
Chronic Has seen neurology Has formal cognitive testing scheduled Possible Lewy body dementia Still with active hallucinations Should not be driving-we will consult chronic care management to help with resources/transportation

## 2021-01-04 ENCOUNTER — Telehealth: Payer: Self-pay

## 2021-01-04 NOTE — Chronic Care Management (AMB) (Signed)
  Chronic Care Management   Note  01/04/2021 Name: Janet Le MRN: [id number] DOB: [birth year]  Janet Le is a 65 y.o. year old female who is a primary care patient of Burns, Claudina Lick, MD. I reached out to [name] by phone today in response to a referral sent by [name]'s PCP, Quay Burow, Claudina Lick, MD      [name] was given information about Chronic Care Management services today including:  CCM service includes personalized support from designated clinical staff supervised by her physician, including individualized plan of care and coordination with other care providers 24/7 contact phone numbers for assistance for urgent and routine care needs. Service will only be billed when office clinical staff spend 20 minutes or more in a month to coordinate care. Only one practitioner may furnish and bill the service in a calendar month. The patient may stop CCM services at any time (effective at the end of the month) by phone call to the office staff. The patient will be responsible for cost sharing (co-pay) of up to 20% of the service fee (after annual deductible is met).  Patient agreed to services and verbal consent obtained.   Follow up plan: Telephone appointment with care management team member scheduled for:01/16/2021  Noreene Larsson, [place], [place], [place] [address] Direct Dial: [phone] [email or web address]

## 2021-01-11 ENCOUNTER — Telehealth: Payer: Self-pay

## 2021-01-11 ENCOUNTER — Other Ambulatory Visit: Payer: Self-pay | Admitting: Internal Medicine

## 2021-01-11 NOTE — Telephone Encounter (Signed)
pt has stated she needs a letter stating she needs railings placed in her shower as she falls a lot. Pt states this letter is needed to give to her housing development.

## 2021-01-12 NOTE — Telephone Encounter (Signed)
Tried to reach patient but VM was full.

## 2021-01-12 NOTE — Telephone Encounter (Signed)
Patient seeing Dr. Jenny Reichmann on Friday.

## 2021-01-13 ENCOUNTER — Encounter: Payer: Self-pay | Admitting: Internal Medicine

## 2021-01-13 ENCOUNTER — Other Ambulatory Visit: Payer: Self-pay

## 2021-01-13 ENCOUNTER — Ambulatory Visit (INDEPENDENT_AMBULATORY_CARE_PROVIDER_SITE_OTHER): Payer: 59 | Admitting: Internal Medicine

## 2021-01-13 VITALS — BP 136/76 | HR 72 | Temp 98.2°F | Resp 18 | Ht 63.0 in | Wt 233.2 lb

## 2021-01-13 DIAGNOSIS — F32A Depression, unspecified: Secondary | ICD-10-CM | POA: Diagnosis not present

## 2021-01-13 DIAGNOSIS — I10 Essential (primary) hypertension: Secondary | ICD-10-CM | POA: Diagnosis not present

## 2021-01-13 DIAGNOSIS — R0781 Pleurodynia: Secondary | ICD-10-CM

## 2021-01-13 DIAGNOSIS — F419 Anxiety disorder, unspecified: Secondary | ICD-10-CM | POA: Diagnosis not present

## 2021-01-13 MED ORDER — TRAMADOL HCL 50 MG PO TABS: 50.0000 mg | ORAL_TABLET | Freq: Four times a day (QID) | ORAL | 0 refills | Status: DC | PRN

## 2021-01-13 MED ORDER — DULOXETINE HCL 60 MG PO CPEP: 60.0000 mg | ORAL_CAPSULE | Freq: Every day | ORAL | 1 refills | Status: DC

## 2021-01-13 NOTE — Progress Notes (Signed)
Chief Complaint: follow up left chest pain after fall July 9       HPI:  Janet Le is a 65 y.o. female here with c/o persistent left chest pain, sharp and sometimes dull, moderate persistent now almost 2 wks after stumble over dog at home and struck left lower anterior chest wall to the wooden furniture arm, fell hard and felt sort of a pop; pain is pleuritic but non postioinal, non exertional, and not associated with sob, diaphoresis, n/v, palp or dizziness.  Recent xray negative for fracture.  No fever cough, and  Pt denies wt loss, night sweats, loss of appetite, or other constitutional symptoms.  Has had recent mild worsening depressive symptoms, but no suicidal ideation, or panic; now out of cymbalta, asks to restart.         Wt Readings from Last 3 Encounters:  01/13/21 233 lb 3.2 oz (105.8 kg)  01/03/21 227 lb (103 kg)  11/03/20 243 lb (110.2 kg)   BP Readings from Last 3 Encounters:  01/13/21 136/76  01/03/21 (!) 142/76  11/03/20 (!) 149/88         Past Medical History:  Diagnosis Date   Allergic rhinitis, cause unspecified    Allergy    Anxiety    Blood dyscrasia    platelets low in past   Cataract    Chronic back pain    Chronic constipation    Chronic sinusitis    Depression    Endometriosis    ENDOMETRIOSIS 05/06/2009   Qualifier: Diagnosis of  By: Surface RN, Butch Penny     Esophageal stricture    GERD (gastroesophageal reflux disease)    Hemorrhoids    Hiatal hernia    Hyperlipidemia    Hypertension    Irritable bowel syndrome    Lymphedema    Osteoarthritis    Personal history of diabetes mellitus    controlled with diet only   Renal cell cancer ([place]) 11/2010 dx   R, s/p cryoablation 02/16/11   Syncopal episodes    since childhood   Past Surgical History:  Procedure Laterality Date   BREAST CYST EXCISION Bilateral over 10 years ago   No visable scar    CATARACT EXTRACTION, BILATERAL Bilateral 2020   COLONOSCOPY     fallopian tube removed      FUNCTIONAL ENDOSCOPIC SINUS SURGERY     HEMORRHOID BANDING     LASER ABLATION OF THE CERVIX     NASAL SINUS SURGERY     RENAL CRYOABLATION  02/16/11   R kidney due to [place] (IR procedure)   TOTAL KNEE ARTHROPLASTY Right 10/30/2016   Procedure: RIGHT TOTAL KNEE ARTHROPLASTY;  Surgeon: Meredith Pel, MD;  Location: [facility];  Service: Orthopedics;  Laterality: Right;   TUBAL LIGATION     UMBILICAL HERNIA REPAIR      reports that she has never smoked. She has never used smokeless tobacco. She reports that she does not drink alcohol and does not use drugs. family history includes Alcohol abuse in her brother, daughter, and father; Alcoholism in her father; Breast cancer in her cousin and maternal aunt; Colitis in her brother; Colon polyps in an other family member; Diabetes in her mother; Heart disease in her mother; Hyperlipidemia in her mother; Hypertension in her mother; Kidney disease in her father and mother; Leukemia in her brother; Multiple sclerosis in her sister; Obesity in her mother; Prostate cancer in her father; Sleep apnea in her mother; Stroke in  her mother; Thyroid disease in her mother. Allergies  Allergen Reactions   Sertraline Hcl Other (See Comments)    Spasms; numbness   Ace Inhibitors Cough   Codeine Palpitations   Darifenacin Hydrobromide Er Other (See Comments)    Pt states made her feel queezy & drunk   Gabapentin Other (See Comments)    Hallucinations   Pravastatin Other (See Comments)    myalgias   Propoxyphene Hcl Palpitations   Wellbutrin [Bupropion Hcl] Other (See Comments)    Numbness of mouth/lips   Current Outpatient Medications on File Prior to Visit  Medication Sig Dispense Refill   Accu-Chek FastClix Lancets MISC USE AS DIRECTED UP  TO  1 TIME  DAILY- DX CODE R73.03 100 each 1   acetaminophen (TYLENOL) 500 MG tablet Take 500 mg by mouth every 6 (six) hours as needed.     amLODipine (NORVASC) 5 MG tablet Take 1 tablet by mouth once daily 90 tablet 0    atorvastatin (LIPITOR) 10 MG tablet Take 1 tablet by mouth once daily 90 tablet 0   calcium carbonate (OS-CAL) 600 MG TABS tablet Take 600 mg by mouth 2 (two) times daily with a meal.     clotrimazole (LOTRIMIN) 1 % cream SMARTSIG:1 Topical Daily PRN     dicyclomine (BENTYL) 10 MG capsule TAKE 1 TO 2 CAPSULES BY MOUTH THREE TIMES DAILY AS NEEDED **NEEDS  APPOINTMENT  FOR  FURTHER  REFILLS** 150 capsule 0   furosemide (LASIX) 20 MG tablet Take 1 tablet by mouth once daily 90 tablet 0   glucose blood (ACCU-CHEK GUIDE) test strip Use to check sugars once daily. Dx Code-R73.03 100 each 1   linaclotide (LINZESS) 145 MCG CAPS capsule Take 1 capsule (145 mcg total) by mouth daily. 90 capsule 0   methotrexate 2.5 MG tablet      Misc Natural Products (HEALTHY LIVER PO) Take by mouth.     Multiple Vitamin (MULTIVITAMIN) tablet Take 1 tablet by mouth daily.     Probiotic Product (PROBIOTIC-10 PO) Take 6 Billion Cells by mouth.     propranolol (INDERAL) 80 MG tablet Take 1 tablet by mouth twice daily 180 tablet 0   pyridOXINE (VITAMIN B-6) 50 MG tablet Take 1 tablet (50 mg total) by mouth daily. 30 tablet 8   telmisartan (MICARDIS) 80 MG tablet Take 1 tablet by mouth once daily 90 tablet 0   modafinil (PROVIGIL) 100 MG tablet Take 1 tablet (100 mg total) by mouth daily. 30 tablet 0   No current facility-administered medications on file prior to visit.        ROS:  All others reviewed and negative.  Objective        PE:  BP 136/76   Pulse 72   Temp 98.2 F (36.8 C) (Oral)   Resp 18   Ht '5\' 3"'$  (1.6 m)   Wt 233 lb 3.2 oz (105.8 kg)   SpO2 98%   BMI 41.31 kg/m                 Constitutional: Pt appears in NAD               HENT: Head: NCAT.                Right Ear: External ear normal.                 Left Ear: External ear normal.  Eyes: . Pupils are equal, round, and reactive to light. Conjunctivae and EOM are normal               Nose: without d/c or deformity                Neck: Neck supple. Gross normal ROM               Cardiovascular: Normal rate and regular rhythm.  ; tender left anterolat chest wall about t10               Pulmonary/Chest: Effort normal and breath sounds without rales or wheezing.                Abd:  Soft, NT, ND, + BS, no organomegaly               Neurological: Pt is alert. At baseline orientation, motor grossly intact               Skin: Skin is warm. No rashes, no other new lesions, LE edema - none               Psychiatric: Pt behavior is normal without agitation   Micro: none  Cardiac tracings I have personally interpreted today:  none  Pertinent Radiological findings (summarize): January 03, 2021 left ribs and chest FINDINGS: No fracture or other bone lesions are seen involving the ribs. There is no evidence of pneumothorax or pleural effusion. Both lungs are clear. Heart size and mediastinal contours are within normal limits.   IMPRESSION: Negative.   Lab Results  Component Value Date   WBC 8.4 09/07/2020   HGB 12.6 09/07/2020   HCT 37.6 09/07/2020   PLT 152.0 09/07/2020   GLUCOSE 95 09/07/2020   CHOL 137 03/10/2019   TRIG 88.0 03/10/2019   HDL 52.10 03/10/2019   LDLDIRECT 143.8 07/01/2012   LDLCALC 67 03/10/2019   ALT 23 09/07/2020   AST 23 09/07/2020   NA 140 09/07/2020   K 3.3 (L) 09/07/2020   CL 103 09/07/2020   CREATININE 0.94 09/07/2020   BUN 14 09/07/2020   CO2 29 09/07/2020   TSH 1.97 09/07/2020   INR 0.91 02/08/2011   HGBA1C 5.7 03/10/2019   MICROALBUR 1.0 01/05/2015   Assessment/Plan:  Janet Le is a 65 y.o. Black or African American [2] female with  has a past medical history of Allergic rhinitis, cause unspecified, Allergy, Anxiety, Blood dyscrasia, Cataract, Chronic back pain, Chronic constipation, Chronic sinusitis, Depression, Endometriosis, ENDOMETRIOSIS (05/06/2009), Esophageal stricture, GERD (gastroesophageal reflux disease), Hemorrhoids, Hiatal hernia, Hyperlipidemia, Hypertension,  Irritable bowel syndrome, Lymphedema, Osteoarthritis, Personal history of diabetes mellitus, Renal cell cancer ([place]) (11/2010 dx), and Syncopal episodes.  Rib pain on left side C/w msk trauma, ok for tramadol prn, salonpaz prn otc, and advised pain may take anohter 2-4 wks to resolve  Essential hypertension BP Readings from Last 3 Encounters:  01/13/21 136/76  01/03/21 (!) 142/76  11/03/20 (!) 149/88   Stable, pt to continue medical treatment norvasc, inderal   Anxiety and depression With mild recent worsening, no SI or HI, for cymbalta restart,  to f/u any worsening symptoms or concerns  Followup: Return if symptoms worsen or fail to improve.  Cathlean Cower, MD 01/13/2021 11:18 PM [facility]

## 2021-01-13 NOTE — Patient Instructions (Signed)
Please take all new medication as prescribed - the tramadol for pain as needed  Please also consider topical lidocaine patch as needed such as SalonPaz  Please continue all other medications as before, and refills have been done if requested - the cymbalta  Please have the pharmacy call with any other refills you may need.  Please continue your efforts at being more active, low cholesterol diet, and weight control.  Please keep your appointments with your specialists as you may have planned

## 2021-01-13 NOTE — Assessment & Plan Note (Signed)
BP Readings from Last 3 Encounters:  01/13/21 136/76  01/03/21 (!) 142/76  11/03/20 (!) 149/88   Stable, pt to continue medical treatment norvasc, inderal

## 2021-01-13 NOTE — Assessment & Plan Note (Signed)
C/w msk trauma, ok for tramadol prn, salonpaz prn otc, and advised pain may take anohter 2-4 wks to resolve

## 2021-01-13 NOTE — Assessment & Plan Note (Signed)
With mild recent worsening, no SI or HI, for cymbalta restart,  to f/u any worsening symptoms or concerns

## 2021-01-16 ENCOUNTER — Telehealth: Payer: Self-pay | Admitting: *Deleted

## 2021-01-16 ENCOUNTER — Telehealth: Payer: Self-pay

## 2021-01-16 ENCOUNTER — Ambulatory Visit (INDEPENDENT_AMBULATORY_CARE_PROVIDER_SITE_OTHER): Payer: 59 | Admitting: *Deleted

## 2021-01-16 DIAGNOSIS — G8929 Other chronic pain: Secondary | ICD-10-CM

## 2021-01-16 DIAGNOSIS — F32A Depression, unspecified: Secondary | ICD-10-CM

## 2021-01-16 DIAGNOSIS — R441 Visual hallucinations: Secondary | ICD-10-CM

## 2021-01-16 DIAGNOSIS — F419 Anxiety disorder, unspecified: Secondary | ICD-10-CM | POA: Diagnosis not present

## 2021-01-16 DIAGNOSIS — F29 Unspecified psychosis not due to a substance or known physiological condition: Secondary | ICD-10-CM

## 2021-01-16 NOTE — Patient Instructions (Signed)
Visit Information   PATIENT GOALS:   Goals Addressed               This Visit's Progress     Help with depression and other needs (pt-stated)        Timeframe:  Long-Range Goal Priority:  High Start Date:01/16/21                             Expected End Date:          03/24/21             Follow Up Date 02/07/21   -expect phone call from our team Pharmacist and [place] -referral being made to [facility] to link you with counselor and Psychiatrist as requested - begin personal counseling - join a support group - practice relaxation or meditation daily - start or continue a personal journal - practice positive thinking and self-talk    Why is this important?   When you are stressed, down or upset, your body reacts too.  For example, your blood pressure may get higher; you may have a headache or stomachache.  When your emotions get the best of you, your body's ability to fight off cold and flu gets weak.  These steps will help you manage your emotions.     Notes:         Consent to CCM Services: Ms. Novitski was given information about Chronic Care Management services today including:  CCM service includes personalized support from designated clinical staff supervised by her physician, including individualized plan of care and coordination with other care providers 24/7 contact phone numbers for assistance for urgent and routine care needs. Service will only be billed when office clinical staff spend 20 minutes or more in a month to coordinate care. Only one practitioner may furnish and bill the service in a calendar month. The patient may stop CCM services at any time (effective at the end of the month) by phone call to the office staff. The patient will be responsible for cost sharing (co-pay) of up to 20% of the service fee (after annual deductible is met).  Patient agreed to services and verbal consent obtained.   The patient verbalized understanding of instructions,  educational materials, and care plan provided today and declined offer to receive copy of patient instructions, educational materials, and care plan.   Telephone follow up appointment with care management team member scheduled for: The patient has been provided with contact information for the care management team and has been advised to call with any health related questions or concerns.  Eduard Clos MSW, LCSW Licensed Clinical Social Worker [place] [phone]   CLINICAL CARE PLAN: Patient Care Plan: LCSW Plan of Care     Problem Identified: Symptoms (Depression)      Long-Range Goal: Provide support, resources and opportunity for reduction of depression and improvement of quality of life   Start Date: 01/16/2021  Expected End Date: 02/16/2021  This Visit's Progress: On track  Priority: High  Note:   Current barriers:   Patient in need of assistance with connecting to community resources for Limited social support, Transportation, Mental Health Concerns , Family and relationship dysfunction, Social Isolation, Limited access to caregiver, Inability to perform ADL's independently, and Lacks knowledge of community resources Acknowledges deficits with meeting this unmet need Patient is unable to independently navigate community resource options without care coordination support Clinical Goals:  patient will work with  SW to address concerns related to depression, social isolation, community resource links/support Clinical Interventions:  CSW called pt today to assess for referral received for depression, transportation, linking with community resources. Pt admits to being depressed; states "Since I was a kid". Pt shared a history of some trauma as a child that she did not get any counseling or support afterwards.  Pt denies any past or current hospitalizations and/or SI/HI. She also has been on some RX for depression and anxiety but is not sure about them- "get my meds mixed  up".  CSW will place referral for Pharmacist on team to further assess and assist pt with the RX concerns/questions.   Per pt, she had  Psychiatrist but they have retired- she would like to be connected with a Occupational psychologist; as well as a new GYN- suggested she call her insurance to seek in-network providers or ask PCP to place referral.  Pt also shared she has been having some hallucinations (she thinks could be related to her meds) and a "lip jerk" that the Neurologist is working up; wonder if this could be related to psych meds- pt agreeable to referral for counseling; as well, would like to see a Psychiatrist for medication guidance. CSW will place referral to [facility] to coordinate in-network providers for her.  Pt is also interested in assistance with her pre-diabetes and other medical conditions- will ask RNCM to follow up with her also.  CSW completed the PHQ9 Depression screening with pt and she scored 16; advised pt she is in the moderate range and hope that with counseling, RX and other support she will be able to reduce her symptoms and feelings of depression.   Collaboration with Binnie Rail, MD regarding development and update of comprehensive plan of care as evidenced by provider attestation and co-signature Inter-disciplinary care team collaboration (see longitudinal plan of care) Assessment of needs, barriers , agencies contacted, as well as how impacting  Review various resources, discussed options and provided patient information about Limited social support, Transportation, Mental Health Concerns , Family and relationship dysfunction, Social Isolation, and Lacks knowledge of community resources Collaborated with appropriate clinical care team members regarding patient needs Limited social support, Transportation, Mental Health Concerns , Family and relationship dysfunction, Social Isolation, Limited access to caregiver, Memory Deficits, and Lacks knowledge of community resources,  Anxiety and Depression Patient interviewed and appropriate assessments performed Referred patient to community resources care guide team for assistance with transportation,  Provided mental health counseling with regard to depression, anxiety, PTSD and grief/loss  Provided patient with information about depression, PTSD, bereavement and overall apathy that she appears to be facing. Discussed plans with patient for ongoing care management follow up and provided patient with direct contact information for care management team Referred patient to [facility] (mental health provider) for arranging in-network providers for long term follow up and therapy/counseling and Psychiatry for medication assist Other interventions provided: Depression screen reviewed , PHQ2/ PHQ9 completed, Solution-Focused Strategies, Active listening / Reflection utilized , Emotional Supportive Provided, Problem [place] , Reviewed mental health medications with patient and discussed compliance, Participation in counseling encouraged , Participation in support group encouraged , Increase in actives / exercise encouraged , and Discussed referral to [facility] to assist with connecting to mental health provider Patient Goals:   -expect phone call from our team Pharmacist and RNCM -referral being made to [facility] to link you with counselor and Psychiatrist as requested - begin personal counseling - join a support group -  practice relaxation or meditation daily - start or continue a personal journal - practice positive thinking and self-talk   Follow Up Plan: Appointment scheduled for SW follow up with client by phone on:  02/07/21

## 2021-01-16 NOTE — Chronic Care Management (AMB) (Signed)
Chronic Care Management    Clinical Social Work Note  01/16/2021 Name: Janet Le MRN: [id number] DOB: 07-15-1955  LATITA INGALLS is a 65 y.o. year old female who is a primary care patient of Burns, Claudina Lick, MD. The CCM team was consulted to assist the patient with chronic disease management and/or care coordination needs related to: Transportation Needs , [employer] , Mental Health Counseling and Resources, and Grief Counseling.   Engaged with patient by telephone for initial visit in response to provider referral for social work chronic care management and care coordination services.   Consent to Services:  The patient was given information about Chronic Care Management services, agreed to services, and gave verbal consent prior to initiation of services.  Please see initial visit note for detailed documentation.   Patient agreed to services and consent obtained.   Assessment: Review of patient past medical history, allergies, medications, and health status, including review of relevant consultants reports was performed today as part of a comprehensive evaluation and provision of chronic care management and care coordination services.     SDOH (Social Determinants of Health) assessments and interventions performed:  SDOH Interventions    Flowsheet Row Most Recent Value  SDOH Interventions   Depression Interventions/Treatment  Referral to Psychiatry, Medication, Counseling        Advanced Directives Status: Not addressed in this encounter.  CCM Care Plan  Allergies  Allergen Reactions   Sertraline Hcl Other (See Comments)    Spasms; numbness   Ace Inhibitors Cough   Codeine Palpitations   Darifenacin Hydrobromide Er Other (See Comments)    Pt states made her feel queezy & drunk   Gabapentin Other (See Comments)    Hallucinations   Pravastatin Other (See Comments)    myalgias   Propoxyphene Hcl Palpitations   Wellbutrin [Bupropion Hcl] Other (See Comments)     Numbness of mouth/lips    Outpatient Encounter Medications as of 01/16/2021  Medication Sig   Accu-Chek FastClix Lancets MISC USE AS DIRECTED UP  TO  1 TIME  DAILY- DX CODE R73.03   acetaminophen (TYLENOL) 500 MG tablet Take 500 mg by mouth every 6 (six) hours as needed.   amLODipine (NORVASC) 5 MG tablet Take 1 tablet by mouth once daily   atorvastatin (LIPITOR) 10 MG tablet Take 1 tablet by mouth once daily   calcium carbonate (OS-CAL) 600 MG TABS tablet Take 600 mg by mouth 2 (two) times daily with a meal.   clotrimazole (LOTRIMIN) 1 % cream SMARTSIG:1 Topical Daily PRN   dicyclomine (BENTYL) 10 MG capsule TAKE 1 TO 2 CAPSULES BY MOUTH THREE TIMES DAILY AS NEEDED **NEEDS  APPOINTMENT  FOR  FURTHER  REFILLS**   DULoxetine (CYMBALTA) 60 MG capsule Take 1 capsule (60 mg total) by mouth daily.   furosemide (LASIX) 20 MG tablet Take 1 tablet by mouth once daily   glucose blood (ACCU-CHEK GUIDE) test strip Use to check sugars once daily. Dx Code-R73.03   linaclotide (LINZESS) 145 MCG CAPS capsule Take 1 capsule (145 mcg total) by mouth daily.   methotrexate 2.5 MG tablet    Misc Natural Products (HEALTHY LIVER PO) Take by mouth.   modafinil (PROVIGIL) 100 MG tablet Take 1 tablet (100 mg total) by mouth daily.   Multiple Vitamin (MULTIVITAMIN) tablet Take 1 tablet by mouth daily.   Probiotic Product (PROBIOTIC-10 PO) Take 6 Billion Cells by mouth.   propranolol (INDERAL) 80 MG tablet Take 1 tablet by mouth twice daily  pyridOXINE (VITAMIN B-6) 50 MG tablet Take 1 tablet (50 mg total) by mouth daily.   telmisartan (MICARDIS) 80 MG tablet Take 1 tablet by mouth once daily   traMADol (ULTRAM) 50 MG tablet Take 1 tablet (50 mg total) by mouth every 6 (six) hours as needed.   No facility-administered encounter medications on file as of 01/16/2021.    Patient Active Problem List   Diagnosis Date Noted   Rib pain on left side 01/03/2021   Vulvar itching 01/03/2021   Psychosis ([place])  09/07/2020   Cervical radiculopathy 09/07/2020   Hallucination, visual 09/07/2020   Confusion 09/07/2020   TB lung, latent 11/25/2019   Blepharitis 11/07/2019   Nocturnal hypoxemia 05/11/2019   Dyspnea on exertion 05/11/2019   Excessive daytime sleepiness 05/11/2019   Primary osteoarthritis involving multiple joints 03/10/2019   Adult ADHD 01/08/2019   Chronic post-traumatic stress disorder (PTSD) 08/12/2018   Bilateral leg edema 06/12/2018   Fatigue 11/14/2017   Chronic back pain 11/14/2017   Arthralgia of hip 11/14/2017   Memory difficulties 10/08/2017   Anxiety and depression 10/08/2017   Neuropathy 10/07/2017   LVH (left ventricular hypertrophy), mild 05/18/2017   Arthritis of knee 10/30/2016   S/P knee surgery 10/30/2016   Prediabetes 05/30/2016   Cervicalgia 05/30/2016   Osteopenia 11/18/2015   Thrombocytopenia ([place]) 05/30/2015   Gout 12/11/2013   Morbid obesity ([place]) 11/06/2013   Cough 07/15/2012   Left lumbar radiculopathy 04/13/2012   Renal cell cancer ([place])    Hemorrhoids 11/14/2010   IBS (irritable bowel syndrome) with chronic constipation 11/14/2010   GERD (gastroesophageal reflux disease) 11/14/2010   HYPERCHOLESTEROLEMIA, MILD 03/16/2010   Lymphedema 03/15/2010   ALLERGIC RHINITIS 08/31/2009   MIGRAINE HEADACHE 06/14/2009   SINUSITIS, CHRONIC 06/14/2009   SYNCOPE 05/16/2009   Essential hypertension 05/06/2009    Conditions to be addressed/monitored: Anxiety and Depression; Limited social support, Transportation, Mental Health Concerns , and Family and relationship dysfunction  Care Plan : LCSW Plan of Care  Updates made by Deirdre Peer, LCSW since 01/16/2021 12:00 AM     Problem: Symptoms (Depression)      Long-Range Goal: Provide support, resources and opportunity for reduction of depression and improvement of quality of life   Start Date: 01/16/2021  Expected End Date: 02/16/2021  This Visit's Progress: On track  Priority: High  Note:    Current barriers:   Patient in need of assistance with connecting to community resources for Limited social support, Transportation, Mental Health Concerns , Family and relationship dysfunction, Social Isolation, Limited access to caregiver, Inability to perform ADL's independently, and Lacks knowledge of community resources Acknowledges deficits with meeting this unmet need Patient is unable to independently navigate community resource options without care coordination support Clinical Goals:  patient will work with SW to address concerns related to depression, social isolation, community resource links/support Clinical Interventions:  CSW called pt today to assess for referral received for depression, transportation, linking with community resources. Pt admits to being depressed; states "Since I was a kid". Pt shared a history of some trauma as a child that she did not get any counseling or support afterwards.  Pt denies any past or current hospitalizations and/or SI/HI. She also has been on some RX for depression and anxiety but is not sure about them- "get my meds mixed up".  CSW will place referral for Pharmacist on team to further assess and assist pt with the RX concerns/questions.   Per pt, she had  Psychiatrist but they have  retired- she would like to be connected with a Occupational psychologist; as well as a new GYN- suggested she call her insurance to seek in-network providers or ask PCP to place referral.  Pt also shared she has been having some hallucinations (she thinks could be related to her meds) and a "lip jerk" that the Neurologist is working up; wonder if this could be related to psych meds- pt agreeable to referral for counseling; as well, would like to see a Psychiatrist for medication guidance. CSW will place referral to [facility] to coordinate in-network providers for her.  Pt is also interested in assistance with her pre-diabetes and other medical conditions- will ask RNCM to follow up  with her also.  CSW completed the PHQ9 Depression screening with pt and she scored 16; advised pt she is in the moderate range and hope that with counseling, RX and other support she will be able to reduce her symptoms and feelings of depression.   Collaboration with Binnie Rail, MD regarding development and update of comprehensive plan of care as evidenced by provider attestation and co-signature Inter-disciplinary care team collaboration (see longitudinal plan of care) Assessment of needs, barriers , agencies contacted, as well as how impacting  Review various resources, discussed options and provided patient information about Limited social support, Transportation, Mental Health Concerns , Family and relationship dysfunction, Social Isolation, and Lacks knowledge of community resources Collaborated with appropriate clinical care team members regarding patient needs Limited social support, Transportation, Mental Health Concerns , Family and relationship dysfunction, Social Isolation, Limited access to caregiver, Memory Deficits, and Lacks knowledge of community resources, Anxiety and Depression Patient interviewed and appropriate assessments performed Referred patient to community resources care guide team for assistance with transportation,  Provided mental health counseling with regard to depression, anxiety, PTSD and grief/loss  Provided patient with information about depression, PTSD, bereavement and overall apathy that she appears to be facing. Discussed plans with patient for ongoing care management follow up and provided patient with direct contact information for care management team Referred patient to [facility] (mental health provider) for arranging in-network providers for long term follow up and therapy/counseling and Psychiatry for medication assist Other interventions provided: Depression screen reviewed , PHQ2/ PHQ9 completed, Solution-Focused Strategies, Active listening /  Reflection utilized , Emotional Supportive Provided, Problem [place] , Reviewed mental health medications with patient and discussed compliance, Participation in counseling encouraged , Participation in support group encouraged , Increase in actives / exercise encouraged , and Discussed referral to [facility] to assist with connecting to mental health provider Patient Goals:   -expect phone call from our team Pharmacist and RNCM -referral being made to [facility] to link you with counselor and Psychiatrist as requested - begin personal counseling - join a support group - practice relaxation or meditation daily - start or continue a personal journal - practice positive thinking and self-talk   Follow Up Plan: Appointment scheduled for SW follow up with client by phone on:  02/07/21      Follow Up Plan: Appointment scheduled for SW follow up with client by phone on: 02/07/21      Eduard Clos MSW, [place] Licensed Clinical Social Worker [place] [place] [phone]

## 2021-01-16 NOTE — Telephone Encounter (Signed)
  Care Management   Follow Up Note   01/16/2021 Name: DASHANTI GUERRERA MRN: [id number] DOB: Jul 13, 1955   Referred by: Binnie Rail, MD Reason for referral : No chief complaint on file.   An unsuccessful telephone outreach was attempted today. The patient was referred to the case management team for assistance with care management and care coordination.   Follow Up Plan: Telephone follow up appointment with care management team member scheduled for:7-10 days Eduard Clos MSW, LCSW Licensed Clinical Social Worker [place] [phone]

## 2021-01-16 NOTE — Progress Notes (Signed)
Opened in error

## 2021-01-16 NOTE — Telephone Encounter (Signed)
   Telephone encounter was:  Unsuccessful.  01/16/2021 Name: Janet Le MRN: [id number] DOB: [birth year]  Unsuccessful outbound call made today to assist with:  Transportation Needs  and Food Insecurity  Outreach Attempt:  1st Attempt  voicemail full unable to leave message   [place], Care Management  [phone] [address] , [place] [address] Email : Ashby Dawes. Greenauer-moran [email or web address]

## 2021-01-17 ENCOUNTER — Telehealth: Payer: Self-pay | Admitting: *Deleted

## 2021-01-17 NOTE — Chronic Care Management (AMB) (Signed)
  Chronic Care Management   Note  01/17/2021 Name: Janet Le MRN: [id number] DOB: Feb 24, 1956  Janet Le is a 65 y.o. year old female who is a primary care patient of Burns, Claudina Lick, MD. Janet Le is currently enrolled in care management services. An additional referral for RNCM was placed.   Follow up plan: Unsuccessful telephone outreach attempt made. The care management team will reach out to the patient again over the next 7 days. If patient returns call to provider office, please advise to call [place]  at [facility] Management  Direct Dial: [phone]

## 2021-01-17 NOTE — Telephone Encounter (Signed)
   Telephone encounter was:  Unsuccessful.  01/17/2021 Name: MAITREYI TALLUTO MRN: [id number] DOB: Oct 13, 1955  Unsuccessful outbound call made today to assist with:  Transportation Needs  and Food Insecurity  Outreach Attempt:  2nd Attempt  voicemail full   [place], Care Management  [phone] [address] , [place] [address] Email : Ashby Dawes. Greenauer-moran [email or web address]

## 2021-01-19 ENCOUNTER — Telehealth: Payer: Self-pay | Admitting: *Deleted

## 2021-01-19 ENCOUNTER — Telehealth: Payer: Self-pay | Admitting: Internal Medicine

## 2021-01-19 NOTE — Chronic Care Management (AMB) (Signed)
  Chronic Care Management   Outreach Note  01/19/2021 Name: Janet Le MRN: [id number] DOB: [birth year]  Referred by: Binnie Rail, MD Reason for referral : No chief complaint on file.   An unsuccessful telephone outreach was attempted today. The patient was referred to the pharmacist for assistance with care management and care coordination.   Follow Up Plan:   Lauretta Grill Upstream Scheduler

## 2021-01-19 NOTE — Chronic Care Management (AMB) (Signed)
  Chronic Care Management   Note  01/19/2021 Name: SONTEE ROUNDTREE MRN: [id number] DOB: [birth year]  LAJAYLA ZACKERY is a 65 y.o. year old female who is a primary care patient of Burns, Claudina Lick, MD. SADDIE VAGO is currently enrolled in care management services. An additional referral for RNCM was placed by Education officer, museum.   Follow up plan: Telephone appointment with care management team member scheduled for:02/09/21  Kaydynce Pat  Care Guide, Embedded Care Coordination [place] [facility]  Care Management  Direct Dial: [phone]

## 2021-01-19 NOTE — Telephone Encounter (Signed)
   Telephone encounter was:  Successful.  01/19/2021 Name: Janet Le MRN: [id number] DOB: [birth year]  Janet Le is a 65 y.o. year old female who is a primary care patient of Burns, Claudina Lick, MD . The community resource team was consulted for assistance with patient is very confused and will need to be 3 way calling to understand medical benefits , does not want to hire in home care but to get [facility] guide performed the following interventions: Patient provided with information about care guide support team and interviewed to confirm resource needs.  Follow Up Plan:  Care guide will follow up with patient by phone over the next few hours  [place], Care Management  [phone] [address] , [place] [address] Email : Ashby Dawes. Greenauer-moran '@[email or web address]

## 2021-01-19 NOTE — Telephone Encounter (Signed)
   Telephone encounter was:  Successful.  01/19/2021 Name: Janet Le MRN: [id number] DOB: February 13, 1956  Janet Le is a 65 y.o. year old female who is a primary care patient of Burns, Claudina Lick, MD . The community resource team was consulted for assistance with [place] with a 3way with her insurance about her benefits, no transportation wthrough [employer] , ordered her a OTC catalog and explained the way to use it   Care guide performed the following interventions: Patient provided with information about care guide support team and interviewed to confirm resource needs.  Follow Up Plan:  Care guide will follow up with patient by phone over the next daY  [place], Care Management  [phone] [address] , [place] [address] Email : Ashby Dawes. Greenauer-moran '@[email or web address]

## 2021-01-20 ENCOUNTER — Telehealth: Payer: Self-pay | Admitting: *Deleted

## 2021-01-24 ENCOUNTER — Telehealth: Payer: Self-pay | Admitting: Internal Medicine

## 2021-01-24 NOTE — Telephone Encounter (Signed)
   Janet Le DOB: 02/10/1956 MRN: [id number]   RIDER WAIVER AND RELEASE OF LIABILITY  For purposes of improving physical access to our facilities, [facility] is pleased to partner with third parties to provide [facility] patients or other authorized individuals the option of convenient, on-demand ground transportation services (the [place]") through use of the technology service that enables users to request on-demand ground transportation from independent third-party providers.  By opting to use and accept these [employer], I, the undersigned, hereby agree on behalf of myself, and on behalf of any minor child using the Government social research officer for whom I am the parent or legal guardian, as follows:  Government social research officer provided to me are provided by independent third-party transportation providers who are not [employer] or employees and who are unaffiliated with [employer]. [facility] is neither a transportation carrier nor a common or public carrier. [facility] has no control over the quality or safety of the transportation that occurs as a result of the [employer]. [facility] cannot guarantee that any third-party transportation provider will complete any arranged transportation service. [facility] makes no representation, warranty, or guarantee regarding the reliability, timeliness, quality, safety, suitability, or availability of any of the Transport Services or that they will be error free. I fully understand that traveling by vehicle involves risks and dangers of serious bodily injury, including permanent disability, paralysis, and death. I agree, on behalf of myself and on behalf of any minor child using the Transport Services for whom I am the parent or legal guardian, that the entire risk arising out of my use of the [employer] remains solely with me, to the maximum extent permitted under applicable law. The [employer] are provided "as  is" and "as available." [facility] disclaims all representations and warranties, express, implied or statutory, not expressly set out in these terms, including the implied warranties of merchantability and fitness for a particular purpose. I hereby waive and release [facility], its agents, employees, officers, directors, representatives, insurers, attorneys, assigns, successors, subsidiaries, and affiliates from any and all past, present, or future claims, demands, liabilities, actions, causes of action, or suits of any kind directly or indirectly arising from acceptance and use of the [employer]. I further waive and release [facility] and its affiliates from all present and future liability and responsibility for any injury or death to persons or damages to property caused by or related to the use of the [employer]. I have read this Waiver and Release of Liability, and I understand the terms used in it and their legal significance. This Waiver is freely and voluntarily given with the understanding that my right (as well as the right of any minor child for whom I am the parent or legal guardian using the [employer]) to legal recourse against [facility] in connection with the [employer] is knowingly surrendered in return for use of these services.   I attest that I read the consent document to [name], gave Ms. Rissmiller the opportunity to ask questions and answered the questions asked (if any). I affirm that Janet Le then provided consent for she's participation in this program.     Darrick Meigs [place]

## 2021-01-25 ENCOUNTER — Other Ambulatory Visit: Payer: Self-pay

## 2021-01-25 ENCOUNTER — Ambulatory Visit: Payer: Medicare (Managed Care)

## 2021-01-25 ENCOUNTER — Encounter: Payer: Self-pay | Admitting: Psychology

## 2021-01-25 ENCOUNTER — Ambulatory Visit (INDEPENDENT_AMBULATORY_CARE_PROVIDER_SITE_OTHER): Payer: 59 | Admitting: Psychology

## 2021-01-25 DIAGNOSIS — G3184 Mild cognitive impairment, so stated: Secondary | ICD-10-CM | POA: Diagnosis not present

## 2021-01-25 DIAGNOSIS — G3183 Dementia with Lewy bodies: Secondary | ICD-10-CM

## 2021-01-25 DIAGNOSIS — R4189 Other symptoms and signs involving cognitive functions and awareness: Secondary | ICD-10-CM

## 2021-01-25 DIAGNOSIS — F02A0 Dementia in other diseases classified elsewhere, mild, without behavioral disturbance, psychotic disturbance, mood disturbance, and anxiety: Secondary | ICD-10-CM

## 2021-01-25 DIAGNOSIS — F02B2 Dementia in other diseases classified elsewhere, moderate, with psychotic disturbance: Secondary | ICD-10-CM | POA: Insufficient documentation

## 2021-01-25 HISTORY — DX: Dementia with Lewy bodies: G31.83

## 2021-01-25 HISTORY — DX: Dementia in other diseases classified elsewhere, mild, without behavioral disturbance, psychotic disturbance, mood disturbance, and anxiety: F02.A0

## 2021-01-25 HISTORY — DX: Mild cognitive impairment of uncertain or unknown etiology: G31.84

## 2021-01-25 NOTE — Progress Notes (Signed)
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    Stomach cancer Neg Hx    This information was confirmed by Ms. Paskett.  Academic/Vocational History: Highest level of educational attainment: 12 years. She graduated from high school, describing herself as an average (B/C/D) student in academic settings. Math was noted as a relative weakness.  History of developmental delay: Denied. History of grade repetition: Denied. Enrollment in special education courses: Denied. History of LD: Denied.  Employment: She currently receives disability benefits. 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   Evaluation Results:   Behavioral Observations: Janet Le was unaccompanied, arrived to her appointment on time, and was appropriately dressed and groomed. She appeared alert and oriented. Observed gait and station were mildly unstable and she ambulated slowly with the help of a cane. Gross motor functioning appeared intact upon informal observation and no abnormal movements (e.g., tremors) were noted. Her affect was generally relaxed and positive. Spontaneous speech  was fluent. Mild word finding difficulties were observed during interview. Thought processes were coherent, organized, and normal in content. Insight into her cognitive difficulties appeared adequate.   She progressed through cognitive tasks at a quite slow rate. She often had difficulty understanding task instructions and required instructions to be repeated and further clarified often. This was especially true across those tasks more complex in nature. During testing, sustained attention was appropriate. Task engagement was adequate and she generally persisted when challenged. However, there were instances where she required additional encouragement provided by the psychometrist. Overall, Janet Le was cooperative with the clinical interview and subsequent testing procedures.   Adequacy of Effort: The validity of neuropsychological testing is limited by the extent to  which the individual being tested may be assumed to have exerted adequate effort during testing. Janet Le expressed her intention to perform to the best of her abilities and exhibited adequate task engagement and persistence. Scores across stand-alone and embedded performance validity measures were variable but largely within expectation. Her one below expectation performance was only a few points from an acceptable score. As such, the results of the current evaluation are believed to be a largely valid representation of Janet Le's current cognitive functioning.  Test Results: Janet Le was largely oriented at the time of the current evaluation. She was one day off when stating the current date and  about two hours off when estimating the current time.   Intellectual abilities based upon educational and vocational attainment were estimated to be in the average range. Premorbid abilities were estimated to be within the well below average range based upon a single-word reading test.   Processing speed was exceptionally low to well  below average. Basic attention was above average. More complex attention (e.g., working memory) was exceptionally low to below average. Executive functioning was exceptionally low to well below average.  Assessed receptive language abilities were average. Assessed expressive language (e.g., verbal fluency and confrontation naming) was below average to average.     Assessed visuospatial/visuoconstructional abilities were exceptionally low to well below average. Points were lost on her drawing of a clock due to poor numerical placement, including the numbers 10 and 11 being placed near the center of the clock. She was also only able to draw one clock hand, placed towards the number 10. Across her copy of a complex figure, she exhibited notable spatial abnormalities and prominent visual distortions.    Learning (i.e., encoding) of novel verbal and visual information was exceptionally low to well below average. Spontaneous delayed recall (i.e., retrieval) of previously learned information was exceptionally low to below average. Retention rates were 7% across a story learning task, 67% (spontaneous) to 100% (cued) across a list learning task, and 16% across a figure drawing task. Performance across recognition tasks was below expectation overall, suggesting limited but some evidence for information consolidation.   Results of emotional screening instruments suggested that recent symptoms of generalized anxiety were in the moderate range, while symptoms of depression were also within the moderate range. A screening instrument assessing recent sleep quality suggested the presence of severe sleep dysfunction.  Tables of Scores:   Note: This summary of test scores accompanies the interpretive report and should not be considered in isolation without reference to the appropriate sections in the text. Descriptors are based on appropriate normative data and may be adjusted based on clinical judgment. Terms such as  "Within Normal Limits" and "Outside Normal Limits" are used when a more specific description of the test score cannot be determined. Descriptors refer to the current evaluation only.         Percentile - Normative Descriptor > 98 - Exceptionally High 91-97 - Well Above Average 75-90 - Above Average 25-74 - Average 9-24 - Below Average 2-8 - Well Below Average < 2 - Exceptionally Low        Validity:    DESCRIPTOR   February 2020 Current    ACS Word Choice: --- --- --- Outside Normal Limits  Dot Counting Test: --- --- --- Within Normal Limits  WAIS-IV Reliable Digit Span: --- --- --- Within Normal Limits  CVLT-III Forced Choice Recognition: --- --- --- Within Normal Limits        Orientation:  Raw Score Raw Score Percentile   NAB Orientation, Form 1 --- 27/29 --- ---        Cognitive Screening:       Raw Score Raw Score Percentile   SLUMS: --- 13/30 --- ---        Intellectual Functioning:       Standard Score Standard Score Percentile   Test of Premorbid Functioning: 83 79 8 Well Below Average        Memory:      Wechsler Memory Scale (WMS-IV):                       Raw Score (Scaled Score) Raw Score (Scaled Score) Percentile     Logical Memory I 14/50 (5) 18/53 (5) 5 Well Below Average    Logical Memory II 12/50 (6) 1/39 (1) <1 Exceptionally Low    Logical Memory Recognition 26/30 13/23 --- Exceptionally Low        [place] Verbal Learning Test (CVLT-III), Standard Form: Raw Score Raw Score (Scaled/Standard Score) Percentile     Total Trials 1-5 43/80 22/80 (67) 1 Exceptionally Low    List B 5/16 2/16 (6) 9 Below Average    Short-Delay Free Recall 9/16 5/16 (6) 9 Below Average    Short-Delay Cued Recall 10/16 6/16 (6) 9 Below Average    Long Delay Free Recall 10/16 4/16 (6) 9 Below Average    Long Delay Cued Recall 10/16 6/16 (6) 9 Below Average      Recognition Hits 14/16 16/16 (14) 91 Well Above Average      False Positive Errors 2 22 (1) <1 Exceptionally Low         Rey-Osterrieth Complex Figure Test (RCFT): Raw Score (T Score) Raw Score (T Score) Percentile     Immediate Recall 9/36 (34) 5.5/36 (29) 2 Well Below Average    Delayed Recall 10/36 (35) 2.5/36 (20) <1 Exceptionally Low    Recognition Total Correct 14/24 (<20) 17/24 (32) 4 Well Below Average      True Positives --- 9 >16 Within Normal Limits      False Positives --- 4 <1 Exceptionally Low        Attention/Executive Function:      Trail Making Test (TMT): Raw Score Raw Score (T Score) Percentile     Part A 66 secs.,  2 errors 76 secs.,  0 errors (33) 5 Well Below Average    Part B 186 secs.,  1 error Discontinued --- Impaired         Symbol Digit Modalities Test (SDMT): Raw Score Raw Score (T Score) Percentile     Written 23 6 (20) <1 Exceptionally Low    Oral 35 14 (25) 1 Exceptionally Low         Scaled Score Scaled Score Percentile   WAIS-IV Digit Span: '7 7 16 '$ Below Average    Forward 9 13 84 Above Average    Backward '7 6 9 '$ Below Average    Sequencing '6 3 1 '$ Exceptionally Low        D-KEFS Color-Word Interference Test: Raw Score (Scaled Score) Raw Score (Scaled Score) Percentile     Color Naming 36 secs. (9) 59 secs. (1) <1 Exceptionally Low    Word Reading 29 secs. (8) 37 secs. (4) 2 Well Below Average    Inhibition 82 secs. (8) Discontinued --- Impaired    Inhibition/Switching 135 secs. (2) Discontinued --- Impaired        [employer]  Sorting Test: Raw Score Raw Score Percentile     Categories (trials) 5 (64) 0 (64) 2-5 Well Below Average    Total Errors 8 48 1 Exceptionally Low    Perseverative Errors 5 9 62 Average    Non-Perseverative Errors 3 39 <1 Exceptionally Low    Failure to Maintain Set 0 0 --- ---        Language:      Verbal Fluency Test: Raw Score Raw Score (T Score) Percentile     Phonemic Fluency (FAS) 35 20 (39) 14 Below Average    Animal Fluency 15 10 (37) 9 Below Average         NAB Language Module, Form 1: T Score T Score Percentile      Auditory Comprehension --- 46 34 Average    Naming --- 28/31 (40) 16 Below Average         Raw Score (T Score) Raw Score (T Score) Percentile     [place] Naming Test: 48/60 (51) --- --- ---        Visuospatial/Visuoconstruction:       Raw Score Raw Score Percentile   Clock Drawing: --- 5/10 --- Impaired  RCFT, Copy: 13.5/36 15.5/36 <1 Exceptionally Low        NAB Spatial Module, Form 1: T Score T Score Percentile     Visual Discrimination --- 36 8 Well Below Average         Scaled Score Scaled Score Percentile   WAIS-IV Block Design: --- 2 <1 Exceptionally Low        Mood and Personality:       Raw Score Raw Score Percentile   Beck Depression Inventory - II: 22 23 --- Moderate  PROMIS Anxiety Questionnaire: --- 24 --- Moderate        Additional Questionnaires:       Raw Score Raw Score Percentile   PROMIS Sleep Disturbance Questionnaire: --- 38 --- Severe   Informed Consent and Coding/Compliance:   The current evaluation represents a clinical evaluation for the purposes previously outlined by the referral source and is in no way reflective of a forensic evaluation.   Ms. Neujahr was provided with a verbal description of the nature and purpose of the present neuropsychological evaluation. Also reviewed were the foreseeable risks and/or discomforts and benefits of the procedure, limits of confidentiality, and mandatory reporting requirements of this provider. The patient was given the opportunity to ask questions and receive answers about the evaluation. Oral consent to participate was provided by the patient.   This evaluation was conducted by Christia Reading, Ph.D., licensed clinical neuropsychologist. Ms. Toalson completed a clinical interview with Dr. Melvyn Novas, billed as one unit [phone], and 180 minutes of cognitive testing and scoring, billed as one unit [phone] and five additional units [id number]. Psychometrist Cruzita Lederer, B.S., assisted Dr. Melvyn Novas with test administration and  scoring procedures. As a separate and discrete service, Dr. Melvyn Novas spent a total of 160 minutes in interpretation and report writing billed as one unit [phone] and two units [id number].

## 2021-01-25 NOTE — Progress Notes (Signed)
   Psychometrician Note   Cognitive testing was administered to Janet Le by Janet Le, B.S. (psychometrist) under the supervision of Janet Le, Ph.D., licensed psychologist on 01/25/2021. Janet Le did not appear overtly distressed by the testing session per behavioral observation or responses across self-report questionnaires. Rest breaks were offered.    The battery of tests administered was selected by Janet Le, Ph.D. with consideration to Janet Le's current level of functioning, the nature of her symptoms, emotional and behavioral responses during interview, level of literacy, observed level of motivation/effort, and the nature of the referral question. This battery was communicated to the psychometrist. Communication between Janet Le, Ph.D. and the psychometrist was ongoing throughout the evaluation and Janet Le, Ph.D. was immediately accessible at all times. Janet Le, Ph.D. provided supervision to the psychometrist on the date of this service to the extent necessary to assure the quality of all services provided.    Janet Le will return within approximately 1-2 weeks for an interactive feedback session with Janet Le at which time her test performances, clinical impressions, and treatment recommendations will be reviewed in detail. Janet Le understands she can contact our office should she require our assistance before this time.  A total of 180 minutes of billable time were spent face-to-face with Janet Le by the psychometrist. This includes both test administration and scoring time. Billing for these services is reflected in the clinical report generated by Janet Le, Ph.D.  This note reflects time spent with the psychometrician and does not include test scores or any clinical interpretations made by Janet Le. The full report will follow in a separate note.

## 2021-01-30 ENCOUNTER — Ambulatory Visit (INDEPENDENT_AMBULATORY_CARE_PROVIDER_SITE_OTHER): Payer: 59 | Admitting: Psychology

## 2021-01-30 ENCOUNTER — Other Ambulatory Visit: Payer: Self-pay

## 2021-01-30 DIAGNOSIS — G3184 Mild cognitive impairment, so stated: Secondary | ICD-10-CM

## 2021-01-30 DIAGNOSIS — G3183 Dementia with Lewy bodies: Secondary | ICD-10-CM | POA: Diagnosis not present

## 2021-01-30 DIAGNOSIS — F331 Major depressive disorder, recurrent, moderate: Secondary | ICD-10-CM

## 2021-01-30 DIAGNOSIS — F411 Generalized anxiety disorder: Secondary | ICD-10-CM | POA: Diagnosis not present

## 2021-01-30 DIAGNOSIS — F02A0 Dementia in other diseases classified elsewhere, mild, without behavioral disturbance, psychotic disturbance, mood disturbance, and anxiety: Secondary | ICD-10-CM

## 2021-01-30 NOTE — Progress Notes (Signed)
   Neuropsychology Feedback Session Janet Le. [facility]  Reason for Referral:   Janet Le is a 64 y.o. right-handed African-American female referred by Metta Clines, D.O., to characterize her current cognitive functioning and assist with diagnostic clarity and treatment planning in the context of subjective cognitive decline, visual hallucinations, tremors, REM sleep behaviors, and concerns for Lewy body dementia.  Feedback:   Janet Le completed a comprehensive neuropsychological evaluation on 01/25/2021. Please refer to that encounter for the full report and recommendations. Briefly, results suggested profound impairment surrounding executive functioning and visuospatial abilities. Additional impairments were exhibited across processing speed and both encoding (i.e., learning) and retrieval aspects of memory, while some variability was exhibited across more complex attention. Relative to her previous evaluation in 2020, Janet Le unfortunately exhibited significant cognitive decline across essentially all assessed cognitive domains. Declines were most evident across executive functioning and aspects of learning and memory. Regarding etiology, I have concerns for Lewy body dementia. Significant cognitive decline over a two year span would certainly suggest the presence of an underlying neurodegenerative disease process. Of the most common conditions, her pronounced impairment surrounding executive functioning and visuospatial abilities aligns well with expected patterns in Lewy body dementia. Additional impairments surrounding processing speed and memory are also consistent with this condition. Behaviorally, Janet Le exhibits several symptoms also concerning for this presentation, including balance instability and frequent falls, REM sleep behaviors, possible fully-formed visual hallucinations, and report of some tremors in her upper extremities. Her  age is further well within the typical age range for this condition.  Janet Le was unaccompanied during the current feedback session. Content of the current session focused on the results of her neuropsychological evaluation. Janet Le was given the opportunity to ask questions and her questions were answered. She was encouraged to reach out should additional questions arise. A copy of her report was provided at the conclusion of the visit.      25 minutes were spent conducting the current feedback session with Janet Le, billed as one unit [phone].

## 2021-01-31 ENCOUNTER — Telehealth: Payer: Self-pay | Admitting: Internal Medicine

## 2021-01-31 NOTE — Chronic Care Management (AMB) (Signed)
Patient was scheduled with the clinical pharmacist for her CCM Initial Phone Visit    Chronic Care Management   Note  01/31/2021 Name: Janet Le MRN: [id number] DOB: December 08, 1955  [name] is a 65 y.o. year old female who is a primary care patient of Burns, Claudina Lick, MD. I reached out to [name] by phone today in response to a referral sent by Ms. Shawnda D Rump's PCP, Quay Burow, Claudina Lick, MD.   Ms. Gelin was given information about Chronic Care Management services today including:  CCM service includes personalized support from designated clinical staff supervised by her physician, including individualized plan of care and coordination with other care providers 24/7 contact phone numbers for assistance for urgent and routine care needs. Service will only be billed when office clinical staff spend 20 minutes or more in a month to coordinate care. Only one practitioner may furnish and bill the service in a calendar month. The patient may stop CCM services at any time (effective at the end of the month) by phone call to the office staff.   Patient agreed to services and verbal consent obtained.   Follow up plan:   Lauretta Grill [facility] Scheduler

## 2021-02-02 ENCOUNTER — Ambulatory Visit (INDEPENDENT_AMBULATORY_CARE_PROVIDER_SITE_OTHER): Payer: 59 | Admitting: Physician Assistant

## 2021-02-02 ENCOUNTER — Other Ambulatory Visit: Payer: Self-pay

## 2021-02-02 ENCOUNTER — Telehealth: Payer: Self-pay | Admitting: Pharmacist

## 2021-02-02 ENCOUNTER — Encounter: Payer: Self-pay | Admitting: Physician Assistant

## 2021-02-02 ENCOUNTER — Ambulatory Visit (INDEPENDENT_AMBULATORY_CARE_PROVIDER_SITE_OTHER): Payer: Commercial Managed Care - HMO | Admitting: Pharmacist

## 2021-02-02 VITALS — BP 106/60 | HR 76 | Ht 63.0 in | Wt 230.0 lb

## 2021-02-02 DIAGNOSIS — R109 Unspecified abdominal pain: Secondary | ICD-10-CM

## 2021-02-02 DIAGNOSIS — I10 Essential (primary) hypertension: Secondary | ICD-10-CM

## 2021-02-02 DIAGNOSIS — K581 Irritable bowel syndrome with constipation: Secondary | ICD-10-CM

## 2021-02-02 DIAGNOSIS — M542 Cervicalgia: Secondary | ICD-10-CM

## 2021-02-02 DIAGNOSIS — E7800 Pure hypercholesterolemia, unspecified: Secondary | ICD-10-CM

## 2021-02-02 DIAGNOSIS — R4189 Other symptoms and signs involving cognitive functions and awareness: Secondary | ICD-10-CM

## 2021-02-02 DIAGNOSIS — M8949 Other hypertrophic osteoarthropathy, multiple sites: Secondary | ICD-10-CM

## 2021-02-02 DIAGNOSIS — K5909 Other constipation: Secondary | ICD-10-CM | POA: Diagnosis not present

## 2021-02-02 DIAGNOSIS — R7303 Prediabetes: Secondary | ICD-10-CM

## 2021-02-02 DIAGNOSIS — M159 Polyosteoarthritis, unspecified: Secondary | ICD-10-CM

## 2021-02-02 DIAGNOSIS — F32A Depression, unspecified: Secondary | ICD-10-CM

## 2021-02-02 DIAGNOSIS — F419 Anxiety disorder, unspecified: Secondary | ICD-10-CM

## 2021-02-02 MED ORDER — LINACLOTIDE 145 MCG PO CAPS: 145.0000 ug | ORAL_CAPSULE | Freq: Every day | ORAL | 3 refills | Status: DC

## 2021-02-02 MED ORDER — DICYCLOMINE HCL 10 MG PO CAPS: ORAL_CAPSULE | ORAL | 3 refills | Status: DC

## 2021-02-02 NOTE — Progress Notes (Signed)
Chief Complaint: Abdominal pain and chronic constipation  HPI:    Janet Le is a 65 year old African-American female with a past medical history as listed below including chronic constipation, GERD, RCC status post cryoablation, hypertension and diabetes, known to Dr. Hilarie Fredrickson, who was referred to me by Binnie Rail, MD for a complaint of abdominal pain and chronic constipation.    02/09/2019 patient seen in clinic for follow-up of upper abdominal pain.  At that time her symptoms had improved with improving psychosocial stressors.  She was given Bentyl 10 mg to use as needed for crampy abdominal pain.  Continued on Linzess 145 mcg daily for constipation.  At that time arrange for hemorrhoid banding.    06/11/2019 EGD and colonoscopy.  EGD with moderate gastritis and otherwise normal.  Colonoscopy with 1 3 mm polyp in the transverse colon, 2 3-4 mm polyps in the descending colon, melanosis in the colon and scars (as expected) in the rectum from previous hemorrhoidal banding. Pathology showed mild chronic gastritis and tubular adenomas.  Repeat colonoscopy recommended in 5 years.    Today, the patient is a poor historian and hard to follow.  She tells me about her new diagnosis of Lewy body dementia.  Explains that she has some right-sided abdominal pain about 10 minutes after each she eats just about anything which feels like a spasm in her abdomen and it becomes tight and hurts rated as a 6-7/10.  This will last for 30 to 40 minutes and will be off and on throughout the day, has been going on for a few months.  Initially using her Dicyclomine 10 mg 1-2 tabs in the morning and typically another in the afternoon but ran out of this medicine.  It was helping.    Also describes that she is having some change in bowel habits noting that occasionally she will be constipated and other times she will have some fecal incontinence.  Tells me that currently she is using her Linzess 145 mcg every other day and  MiraLAX dose in between.    Denies fever, chills, weight loss, blood in her stool or symptoms that awaken her from sleep.     Past Medical History:  Diagnosis Date   Allergic rhinitis 08/31/2009   Arthralgia of hip 11/14/2017   Attention deficit hyperactivity disorder 06/06/2020   diagnosed as adult w/o formal testing   Bilateral leg edema 06/12/2018   Blepharitis 11/07/2019   Blood dyscrasia    platelets low in past   Cataract    Cervical radiculopathy 09/07/2020   Cervicalgia 05/30/2016   Chronic low back pain 07/18/2016   Degeneration of lumbar intervertebral disc 01/09/2018   Dyspnea on exertion 05/11/2019   Endometriosis    Esophageal stricture    Essential hypertension 05/06/2009   Excessive daytime sleepiness 05/11/2019   External hemorrhoids 08/28/2010   Fatigue 11/14/2017   Generalized anxiety disorder 10/08/2017   GERD (gastroesophageal reflux disease)    Glaucoma 06/06/2020   Gout 12/11/2013   Hiatal hernia    Hypertensive retinopathy of both eyes 10/06/2019   IBS (irritable bowel syndrome) with chronic constipation 11/14/2010   Iridocyclitis 10/06/2019   Laryngopharyngeal reflux (LPR) 07/03/2018   Lattice degeneration of both retinas 10/06/2019   Left lumbar radiculopathy 04/13/2012   LVH (left ventricular hypertrophy), mild 05/18/2017   Echo 04/2017 - mild LVH, normal EF, Grade 1 DD   Lymphedema    Major depressive disorder 10/08/2017   Migraine 06/14/2009   Mild neurocognitive disorder with Lewy  bodies 01/25/2021   Neuropathy 10/07/2017   Nocturnal hypoxemia 05/11/2019   Osteoarthritis    Osteopenia 11/18/2015   06/10/2018: LFN -1.1, left radius 0.7, low FRAX.  No significant change from 2017  dexa 10/2015: t score  Spine -1.3, dual femur -0.9  Took alendronate 2014-2019   Post traumatic stress disorder (PTSD)    Primary osteoarthritis involving multiple joints 03/10/2019   Bilateral hips, knees   Pseudophakia of both eyes 10/06/2019   Pure  hypercholesterolemia 03/16/2010   Renal cell carcinoma    R cryoablation by IR 02/16/11, Dr. Kathlene Cote  Followed by Dr. Janee Morn  No evidence of residual disease on CT 12/13 and 12/14   Rib pain on left side 01/03/2021   S/P knee surgery 10/30/2016   Spondylolisthesis of lumbar region 01/09/2018   Syncope and collapse 05/16/2009   since childhood; associated with extreme heat   TB lung, latent 11/25/2019   Thrombocytopenia 05/30/2015   Saw hem 02/2017 - mild, chronic - advised to stop protonix, no concerning cause, plan for pcp to monitor   Tinnitus of right ear 07/03/2018   Type 2 diabetes mellitus without complication, without long-term current use of insulin 10/06/2019   Visual hallucinations    Vulvar itching 01/03/2021    Past Surgical History:  Procedure Laterality Date   BREAST CYST EXCISION Bilateral over 10 years ago   No visable scar    CATARACT EXTRACTION, BILATERAL Bilateral 2020   COLONOSCOPY     fallopian tube removed     FUNCTIONAL ENDOSCOPIC SINUS SURGERY     HEMORRHOID BANDING     LASER ABLATION OF THE CERVIX     NASAL SINUS SURGERY     RENAL CRYOABLATION  02/16/11   R kidney due to [place] (IR procedure)   TOTAL KNEE ARTHROPLASTY Right 10/30/2016   Procedure: RIGHT TOTAL KNEE ARTHROPLASTY;  Surgeon: Meredith Pel, MD;  Location: [facility];  Service: Orthopedics;  Laterality: Right;   TUBAL LIGATION     UMBILICAL HERNIA REPAIR      Current Outpatient Medications  Medication Sig Dispense Refill   Accu-Chek FastClix Lancets MISC USE AS DIRECTED UP  TO  1 TIME  DAILY- DX CODE R73.03 100 each 1   acetaminophen (TYLENOL) 325 MG tablet Take 1,300 mg by mouth at bedtime.     amLODipine (NORVASC) 5 MG tablet Take 1 tablet by mouth once daily 90 tablet 0   atorvastatin (LIPITOR) 10 MG tablet Take 1 tablet by mouth once daily 90 tablet 0   calcium carbonate (OS-CAL) 600 MG TABS tablet Take 600 mg by mouth 2 (two) times daily with a meal.     clotrimazole (LOTRIMIN) 1 %  cream SMARTSIG:1 Topical Daily PRN     dicyclomine (BENTYL) 10 MG capsule TAKE 1 TO 2 CAPSULES BY MOUTH THREE TIMES DAILY AS NEEDED **NEEDS  APPOINTMENT  FOR  FURTHER  REFILLS** 150 capsule 0   DULoxetine (CYMBALTA) 60 MG capsule Take 1 capsule (60 mg total) by mouth daily. 30 capsule 1   folic acid (FOLVITE) 1 MG tablet Take 1 mg by mouth daily.     furosemide (LASIX) 20 MG tablet Take 1 tablet by mouth once daily 90 tablet 0   glucose blood (ACCU-CHEK GUIDE) test strip Use to check sugars once daily. Dx Code-R73.03 100 each 1   linaclotide (LINZESS) 145 MCG CAPS capsule Take 1 capsule (145 mcg total) by mouth daily. 90 capsule 0   methotrexate 2.5 MG tablet Take 10 mg by mouth  once a week. Mondays     Misc Natural Products (HEALTHY LIVER PO) Take by mouth.     modafinil (PROVIGIL) 100 MG tablet Take 1 tablet (100 mg total) by mouth daily. 30 tablet 0   Multiple Vitamin (MULTIVITAMIN) tablet Take 1 tablet by mouth daily.     polyethylene glycol powder (GLYCOLAX/MIRALAX) 17 GM/SCOOP powder Take 1 Container by mouth once.     Probiotic Product (PROBIOTIC-10 PO) Take 6 Billion Cells by mouth.     propranolol (INDERAL) 80 MG tablet Take 1 tablet by mouth twice daily 180 tablet 0   pyridOXINE (VITAMIN B-6) 50 MG tablet Take 1 tablet (50 mg total) by mouth daily. 30 tablet 8   telmisartan (MICARDIS) 80 MG tablet Take 1 tablet by mouth once daily 90 tablet 0   traMADol (ULTRAM) 50 MG tablet Take 1 tablet (50 mg total) by mouth every 6 (six) hours as needed. 30 tablet 0   No current facility-administered medications for this visit.    Allergies as of 02/02/2021 - Review Complete 01/13/2021  Allergen Reaction Noted   Sertraline hcl Other (See Comments) 04/11/2011   Ace inhibitors Cough 07/30/2012   Codeine Palpitations    Darifenacin hydrobromide er Other (See Comments) 05/14/2011   Gabapentin Other (See Comments) 10/23/2011   Pravastatin Other (See Comments) 01/06/2014   Propoxyphene hcl  Palpitations    Wellbutrin [bupropion hcl] Other (See Comments) 01/10/2011    Family History  Problem Relation Age of Onset   Diabetes Mother    Hypertension Mother    Hyperlipidemia Mother    Heart disease Mother    Stroke Mother    Kidney disease Mother    Thyroid disease Mother    Sleep apnea Mother    Obesity Mother    Memory loss Mother    Kidney disease Father    Prostate cancer Father    Alcoholism Father    Alcohol abuse Father    Multiple sclerosis Sister    Colitis Brother    Alcohol abuse Brother    Leukemia Brother    Alcohol abuse Daughter    Breast cancer Maternal Aunt    Breast cancer Cousin    Colon polyps Other    Colon cancer Neg Hx    Esophageal cancer Neg Hx    Pancreatic cancer Neg Hx    Rectal cancer Neg Hx    Stomach cancer Neg Hx     Social History   Socioeconomic History   Marital status: Legally Separated    Spouse name: Not on file   Number of children: 4   Years of education: 12   Highest education level: High school graduate  Occupational History   Occupation: Disabled  Tobacco Use   Smoking status: Never   Smokeless tobacco: Never  Vaping Use   Vaping Use: Never used  Substance and Sexual Activity   Alcohol use: No    Comment: rare   Drug use: No   Sexual activity: Never  Other Topics Concern   Not on file  Social History Narrative   Right handed   Social Determinants of Health   Financial Resource Strain: Not on file  Food Insecurity: Not on file  Transportation Needs: Not on file  Physical Activity: Not on file  Stress: Not on file  Social Connections: Not on file  Intimate Partner Violence: Not on file    Review of Systems:    Constitutional: No weight loss, fever or chills Cardiovascular: No chest pain  Respiratory:  No SOB  Gastrointestinal: See HPI and otherwise negative   Physical Exam:  Vital signs: BP 106/60   Pulse 76   Ht '5\' 3"'$  (1.6 m)   Wt 230 lb (104.3 kg)   BMI 40.74 kg/m     Constitutional:   Pleasant obese AA female appears to be in NAD, Well developed, Well nourished, alert and cooperative Respiratory: Respirations even and unlabored. Lungs clear to auscultation bilaterally.   No wheezes, crackles, or rhonchi.  Cardiovascular: Normal S1, S2. No MRG. Regular rate and rhythm. No peripheral edema, cyanosis or pallor.  Gastrointestinal:  Soft, nondistended, nontender. No rebound or guarding. Normal bowel sounds. No appreciable masses or hepatomegaly. Psychiatric:  Demonstrates good judgement and reason without abnormal affect or behaviors.  RELEVANT LABS AND IMAGING: CBC    Component Value Date/Time   WBC 8.4 09/07/2020 1242   RBC 3.86 (L) 09/07/2020 1242   HGB 12.6 09/07/2020 1242   HGB 11.9 02/28/2017 1035   HCT 37.6 09/07/2020 1242   HCT 37.0 02/28/2017 1035   PLT 152.0 09/07/2020 1242   PLT 114 (L) 02/28/2017 1035   MCV 97.5 09/07/2020 1242   MCV 94.9 02/28/2017 1035   MCH 30.5 02/28/2017 1035   MCH 31.1 10/18/2016 1203   MCHC 33.4 09/07/2020 1242   RDW 15.2 09/07/2020 1242   RDW 15.5 (H) 02/28/2017 1035   LYMPHSABS 1.7 09/07/2020 1242   LYMPHSABS 1.8 02/28/2017 1035   MONOABS 0.6 09/07/2020 1242   MONOABS 0.5 02/28/2017 1035   EOSABS 0.1 09/07/2020 1242   EOSABS 0.1 02/28/2017 1035   BASOSABS 0.0 09/07/2020 1242   BASOSABS 0.0 02/28/2017 1035    CMP     Component Value Date/Time   NA 140 09/07/2020 1242   NA 142 03/05/2018 1040   NA 141 02/28/2017 1035   K 3.3 (L) 09/07/2020 1242   K 4.0 02/28/2017 1035   CL 103 09/07/2020 1242   CO2 29 09/07/2020 1242   CO2 28 02/28/2017 1035   GLUCOSE 95 09/07/2020 1242   GLUCOSE 80 02/28/2017 1035   BUN 14 09/07/2020 1242   BUN 11 03/05/2018 1040   BUN 9.0 02/28/2017 1035   CREATININE 0.94 09/07/2020 1242   CREATININE 0.9 02/28/2017 1035   CALCIUM 10.8 (H) 09/07/2020 1242   CALCIUM 10.1 02/28/2017 1035   PROT 7.0 11/02/2020 1636   PROT 6.8 03/05/2018 1040   PROT 7.3 02/28/2017 1035    ALBUMIN 4.0 09/07/2020 1242   ALBUMIN 4.1 03/05/2018 1040   ALBUMIN 3.4 (L) 02/28/2017 1035   AST 23 09/07/2020 1242   AST 18 02/28/2017 1035   ALT 23 09/07/2020 1242   ALT 17 02/28/2017 1035   ALKPHOS 84 09/07/2020 1242   ALKPHOS 92 02/28/2017 1035   BILITOT 0.4 09/07/2020 1242   BILITOT 0.3 03/05/2018 1040   BILITOT 0.27 02/28/2017 1035   GFRNONAA 78 03/05/2018 1040   GFRNONAA 71 05/25/2015 1508   GFRAA 90 03/05/2018 1040   GFRAA 82 05/25/2015 1508    Assessment: 1.  Chronic constipation: Some constipation and some fecal incontinence recently with alternate dosing of Linzess 145 mcg and MiraLAX; likely still constipated with some overflow 2.  Abdominal pain: Treated with Dicyclomine 10 mg previously, was using this and it helped but ran out of this medicine; likely IBS  Plan: 1.  Refilled patient's Dicyclomine 10 mg 1-2 tabs 3 times a day, patient asked for a 90-day prescription.  Prescribed #360 with 3 refills. 2.  Refilled Linzess 145 mcg daily #  90 with 3 refills. 3.  Recommend the patient use her Linzess daily instead of alternating with MiraLAX as it sounds like she is still constipated with some days of overflow.  She verbalized understanding. 4.  Patient to follow in clinic with [place] as needed.  Ellouise Newer, PA-C [facility] 02/02/2021, 1:54 PM  Cc: Binnie Rail, MD

## 2021-02-02 NOTE — Progress Notes (Signed)
Addendum: Reviewed and agree with assessment and management plan. Taronda Comacho M, MD  

## 2021-02-02 NOTE — Patient Instructions (Signed)
Refills sent for Dicyclomine and Linzess to [facility].   Follow up as needed.  If you are age 65 or older, your body mass index should be between 23-30. Your Body mass index is 40.74 kg/m. If this is out of the aforementioned range listed, please consider follow up with your Primary Care Provider.  If you are age 55 or younger, your body mass index should be between 19-25. Your Body mass index is 40.74 kg/m. If this is out of the aformentioned range listed, please consider follow up with your Primary Care Provider.   __________________________________________________________  The [facility] providers would like to encourage you to use [facility] to communicate with providers for non-urgent requests or questions.  Due to long hold times on the telephone, sending your provider a message by [facility] may be a faster and more efficient way to get a response.  Please allow 48 business hours for a response.  Please remember that this is for non-urgent requests.

## 2021-02-02 NOTE — Patient Instructions (Signed)
Visit Information  Phone number for Pharmacist: [phone]  Thank you for meeting with me to discuss your medications! I look forward to working with you to achieve your health care goals. Below is a summary of what we talked about during the visit:   Goals Addressed             This Visit's Progress    Manage My Medicine       Timeframe:  Long-Range Goal Priority:  High Start Date:    02/02/21                         Expected End Date:    08/05/21                   Follow Up Date Sept 2022   - call for medicine refill 2 or 3 days before it runs out - call if I am sick and can't take my medicine - keep a list of all the medicines I take; vitamins and herbals too -Utilize [facility] for medication synchronization, packaging and delivery     Why is this important?   These steps will help you keep on track with your medicines.   Notes:         Care Plan : [place]  Updates made by Charlton Haws, RPH since 02/02/2021 12:00 AM     Problem: Hypertension, Hyperlipidemia, Diabetes, Depression, Anxiety, Osteopenia, and Osteoarthritis   Priority: High     Long-Range Goal: Disease management   Start Date: 02/02/2021  Expected End Date: 02/02/2022  This Visit's Progress: On track  Priority: High  Note:   Current Barriers:  Unable to independently monitor therapeutic efficacy Suboptimal pharmacy services - lack synchronization, delivery  Pharmacist Clinical Goal(s):  Patient will achieve adherence to monitoring guidelines and medication adherence to achieve therapeutic efficacy Improve pharmacy services through collaboration with PharmD and provider.   Interventions: 1:1 collaboration with Binnie Rail, MD regarding development and update of comprehensive plan of care as evidenced by provider attestation and co-signature Inter-disciplinary care team collaboration (see longitudinal plan of care) Comprehensive medication review performed;  medication list updated in electronic medical record  Hypertension (BP goal <140/90) -Controlled - pt is not checking BP at home but BP in recent office visit has been at goal; she needs a new BP monitor -Current treatment: Amlodipine 5 mg daily AM Furosemide 20 mg daily AM Propranolol 80 mg BID  Telmisartan 80 mg daily AM -Current home readings: n/a -Denies hypotensive/hypertensive symptoms -Educated on BP goals and benefits of medications for prevention of heart attack, stroke and kidney damage; Proper BP monitoring technique; -Counseled to monitor BP at home periodically, document, and provide log at future appointments -Recommended to continue current medication  Hyperlipidemia: (LDL goal < 100) -Controlled - LDL is at goal; pt endorses compliance with statin -Current treatment: Atorvastatin 10 mg daily AM -Educated on Cholesterol goals; Benefits of statin for ASCVD risk reduction; -Recommended to continue current medication  Diabetes (A1c goal <7%) -Diet-controlled -Educated on A1c and blood sugar goals; -Counseled on diet and exercise extensively  Depression/Anxiety/ADHD (Goal: manage symptoms) -Not ideally controlled - pt follows with psychiatry, neuropsych and recently underwent neuropsychiatric testing strongly suggesting Lewy Body dementia -pt reports her psychiatrist recently retired and she is trying to get set up with a new one -Current treatment: Duloxetine 60 mg daily AM Modafinil 100 mg daily ([place]) -Medications previously tried/failed: Adderall, alprazolam, amitriptyline, escitalopram,  sertraline, methylphenidate, venlafaxine -PHQ9: 16 (12/2020) -GAD7: 15 (02/2018) -Educated on Benefits of medication for symptom control -Recommended to continue current medication  Osteopenia (Goal prevent fractures) -Not ideally controlled - Pt is not taking vitamin D; she is also taking total calcium dose at once in AM; -Last DEXA Scan: 06/10/2018   T-Score femoral  neck: -1.1  T-Score total hip: -0.3  10-year probability of major osteoporotic fracture: 6.1%  10-year probability of hip fracture: 0.2% -Patient is not a candidate for pharmacologic treatment -Current treatment  Calcium carbonate 600 mg - 2 tab in AM -Recommend [phone] units of vitamin D daily. Recommend 1200 mg of calcium daily from dietary and supplemental sources. -Pt is due for repeat DEXA scan -Advised to separate calcium tablets to 1 AM and 1 PM due to absorption issues  IBS (Goal: maange symptoms) -Controlled - per patient report -Current treatment  Dicyclomine 10 mg - 2 AM, 1 PM Linzess 145 mcg daily AM Miralax AM Probiotic AM -Recommended to continue current medication  Pain (Goal: manage symptoms) -Controlled -Hx osteoarthritis, lumbar disc degeneration, lumbar radiculopathy,  -Current treatment  Tylenol 325 mg PRN - 4 at HS Tramadol 50 mg q6h PRN -Recommended to continue current medication  Iridocyclitis (Goal: manage symptoms/prevent progression) -Controlled - follows with Dr Manuella Ghazi -Current treatment  Methotrexate 2.5 mg Manuella Ghazi) - 4 once a week (Mondays) Folic acid 1 mg daily -Recommended to continue current medication  Health Maintenance -Vaccine gaps: PPSV23 -Current therapy:  Vitamin B6 Multivitamin -Patient is satisfied with current therapy and denies issues -Recommended to continue current medication  Patient Goals/Self-Care Activities Patient will:  - take medications as prescribed focus on medication adherence by pill packs check blood pressure periodically, document, and provide at future appointments collaborate with provider on medication access solutions -Utilize [facility] for medication synchronization, packaging and delivery      Ms. Hove was given information about Chronic Care Management services today including:  CCM service includes personalized support from designated clinical staff supervised by her physician, including  individualized plan of care and coordination with other care providers 24/7 contact phone numbers for assistance for urgent and routine care needs. Standard insurance, coinsurance, copays and deductibles apply for chronic care management only during months in which we provide at least 20 minutes of these services. Most insurances cover these services at 100%, however patients may be responsible for any copay, coinsurance and/or deductible if applicable. This service may help you avoid the need for more expensive face-to-face services. Only one practitioner may furnish and bill the service in a calendar month. The patient may stop CCM services at any time (effective at the end of the month) by phone call to the office staff.  Patient agreed to services and verbal consent obtained.   Patient verbalizes understanding of instructions provided today and agrees to view in [place].  Telephone follow up appointment with pharmacy team member scheduled for: 6 months  [name], PharmD, [place], CPP Clinical Pharmacist [facility] [phone]

## 2021-02-02 NOTE — Progress Notes (Signed)
Chronic Care Management Pharmacy Note  02/02/2021 Name:  Janet Le MRN:  [id number] DOB:  14-Jul-1955  Summary: -Pt is struggling to manage her medications due to cognitive impairment  Recommendations/Changes made from today's visit: -Utilize [facility] for medication synchronization, packaging and delivery    Subjective: Janet Le is an 65 y.o. year old female who is a primary patient of Burns, Claudina Lick, MD.  The CCM team was consulted for assistance with disease management and care coordination needs.    Engaged with patient by telephone for initial visit in response to provider referral for pharmacy case management and/or care coordination services.   Consent to Services:  The patient was given the following information about Chronic Care Management services today, agreed to services, and gave verbal consent: 1. CCM service includes personalized support from designated clinical staff supervised by the primary care provider, including individualized plan of care and coordination with other care providers 2. 24/7 contact phone numbers for assistance for urgent and routine care needs. 3. Service will only be billed when office clinical staff spend 20 minutes or more in a month to coordinate care. 4. Only one practitioner may furnish and bill the service in a calendar month. 5.The patient may stop CCM services at any time (effective at the end of the month) by phone call to the office staff. 6. The patient will be responsible for cost sharing (co-pay) of up to 20% of the service fee (after annual deductible is met). Patient agreed to services and consent obtained.  Patient Care Team: Binnie Rail, MD as PCP - General (Internal Medicine) Sable Feil, MD as Consulting Physician (Gastroenterology) Larey Dresser, MD as Consulting Physician (Cardiology) Izora Gala, MD as Consulting Physician (Otolaryngology) Jeannett Senior as Physician Assistant  (Orthopedic Surgery) Juanda Chance, NP as Nurse Practitioner (Obstetrics and Gynecology) Franchot Gallo, MD as Consulting Physician (Urology) Aletta Edouard, MD as Consulting Physician (Radiology) Sharol Roussel, Price (Optometry) Pieter Partridge, DO (Neurology) Letta Pate Luanna Salk, MD (Physical Medicine and Rehabilitation) Deirdre Peer, LCSW as Social Worker (Licensed Clinical Social Worker) Franchot Gallo, Lenon Oms, RN as [place] [facility] Charlton Haws, [facility] as Pharmacist (Pharmacist)  Recent office visits: 01/13/21 Dr Jenny Reichmann OV: rib pain - rx'd tramadol  01/03/21 Dr Quay Burow OV: s/p fall, rib pain.   09/21/20 Dr Quay Burow OV: confusion/psychosis - hallucinations/paranoia. Stressed f/u with neurology/psych.  Recent consult visits: 01/30/21 Dr Melvyn Novas (Psychology): neuropsych evaluation done 8/3 - found profound impairment w/ executive functioning and visuospatial abilities. Significant decline since 2020. Concern for Lewy body dementia.  12/02/20 phone call - change Adderall to modafinil for fatigue/ADD.  11/02/20 Dr Tomi Likens (neurology): cognitive deficits. Referred to neuropsych.  08/29/20 Dr Montel Culver (psych): change Adderall to modafinil 100 mg.   Hospital visits: None in previous 6 months   Objective:  Lab Results  Component Value Date   CREATININE 0.94 09/07/2020   BUN 14 09/07/2020   GFR 63.84 09/07/2020   GFRNONAA 78 03/05/2018   GFRAA 90 03/05/2018   NA 140 09/07/2020   K 3.3 (L) 09/07/2020   CALCIUM 10.8 (H) 09/07/2020   CO2 29 09/07/2020   GLUCOSE 95 09/07/2020    Lab Results  Component Value Date/Time   HGBA1C 5.7 03/10/2019 12:23 PM   HGBA1C 5.5 03/05/2018 10:40 AM   GFR 63.84 09/07/2020 12:42 PM   GFR 64.63 03/10/2019 12:23 PM   MICROALBUR 1.0 01/05/2015 11:40 AM   MICROALBUR 0.8 01/07/2013 10:11 AM  Last diabetic Eye exam:  Lab Results  Component Value Date/Time   HMDIABEYEEXA No Retinopathy 08/31/2016 12:00 AM    Last diabetic Foot  exam: No results found for: HMDIABFOOTEX   Lab Results  Component Value Date   CHOL 137 03/10/2019   HDL 52.10 03/10/2019   LDLCALC 67 03/10/2019   LDLDIRECT 143.8 07/01/2012   TRIG 88.0 03/10/2019   CHOLHDL 3 03/10/2019    Hepatic Function Latest Ref Rng & Units 11/02/2020 09/07/2020 03/10/2019  Total Protein 6.1 - 8.1 g/dL 7.0 7.6 7.3  Albumin 3.5 - 5.2 g/dL - 4.0 3.9  AST 0 - 37 U/L - 23 14  ALT 0 - 35 U/L - 23 12  Alk Phosphatase 39 - 117 U/L - 84 107  Total Bilirubin 0.2 - 1.2 mg/dL - 0.4 0.3  Bilirubin, Direct <=0.2 mg/dL - - -    Lab Results  Component Value Date/Time   TSH 1.97 09/07/2020 12:42 PM   TSH 1.37 11/15/2017 03:03 PM    CBC Latest Ref Rng & Units 09/07/2020 03/10/2019 12/19/2018  WBC 4.0 - 10.5 K/uL 8.4 7.5 10.1  Hemoglobin 12.0 - 15.0 g/dL 12.6 12.4 13.1  Hematocrit 36.0 - 46.0 % 37.6 37.9 39.8  Platelets 150.0 - 400.0 K/uL 152.0 141.0(L) 141.0(L)    Lab Results  Component Value Date/Time   VD25OH 46.7 03/05/2018 10:40 AM    Clinical ASCVD: No  The 10-year ASCVD risk score Mikey Bussing DC Jr., et al., 2013) is: 17.2%   Values used to calculate the score:     Age: 31 years     Sex: Female     Is Non-Hispanic African American: Yes     Diabetic: Yes     Tobacco smoker: No     Systolic Blood Pressure: 633 mmHg     Is BP treated: Yes     HDL Cholesterol: 52.1 mg/dL     Total Cholesterol: 137 mg/dL    Depression screen [facility] 2/9 01/16/2021 07/12/2020 11/25/2019  Decreased Interest 3 3 0  Down, Depressed, Hopeless 1 2 0  PHQ - 2 Score 4 5 0  Altered sleeping 3 3 -  Tired, decreased energy 3 3 -  Change in appetite 0 2 -  Feeling bad or failure about yourself  3 2 -  Trouble concentrating 3 3 -  Moving slowly or fidgety/restless 0 2 -  Suicidal thoughts 0 0 -  PHQ-9 Score 16 20 -  Difficult doing work/chores Very difficult Somewhat difficult -  Some recent data might be hidden    GAD 7 : Generalized Anxiety Score 03/17/2018  Nervous, Anxious, on Edge 3   Control/stop worrying 3  Worry too much - different things 3  Trouble relaxing 3  Restless 0  Easily annoyed or irritable 3  Afraid - awful might happen 0  Total GAD 7 Score 15  Anxiety Difficulty Very difficult     Social History   Tobacco Use  Smoking Status Never  Smokeless Tobacco Never   BP Readings from Last 3 Encounters:  01/13/21 136/76  01/03/21 (!) 142/76  11/03/20 (!) 149/88   Pulse Readings from Last 3 Encounters:  01/13/21 72  01/03/21 73  11/03/20 67   Wt Readings from Last 3 Encounters:  01/13/21 233 lb 3.2 oz (105.8 kg)  01/03/21 227 lb (103 kg)  11/03/20 243 lb (110.2 kg)   BMI Readings from Last 3 Encounters:  01/13/21 41.31 kg/m  01/03/21 40.21 kg/m  11/03/20 43.05 kg/m  Assessment/Interventions: Review of patient past medical history, allergies, medications, health status, including review of consultants reports, laboratory and other test data, was performed as part of comprehensive evaluation and provision of chronic care management services.   SDOH:  (Social Determinants of Health) assessments and interventions performed: Yes  SDOH Screenings   Alcohol Screen: Not on file  Depression (PHQ2-9): Medium Risk   PHQ-2 Score: 16  Financial Resource Strain: Not on file  Food Insecurity: Not on file  Housing: Not on file  Physical Activity: Not on file  Social Connections: Not on file  Stress: Not on file  Tobacco Use: Low Risk    Smoking Tobacco Use: Never   Smokeless Tobacco Use: Never  Transportation Needs: Not on file    [place]  Allergies  Allergen Reactions   Sertraline Hcl Other (See Comments)    Spasms; numbness   Ace Inhibitors Cough   Codeine Palpitations   Darifenacin Hydrobromide Er Other (See Comments)    Pt states made her feel queezy & drunk   Gabapentin Other (See Comments)    Hallucinations   Pravastatin Other (See Comments)    myalgias   Propoxyphene Hcl Palpitations   Wellbutrin [Bupropion Hcl]  Other (See Comments)    Numbness of mouth/lips    Medications Reviewed Today     Reviewed by Charlton Haws, [facility] (Pharmacist) on 02/02/21 at [place] List Status: <None>   Medication Order Taking? Sig Documenting Provider Last Dose Status Informant  Accu-Chek FastClix Lancets MISC [id number] Yes USE AS DIRECTED UP  TO  1 TIME  DAILY- DX CODE R73.03 Burns, Claudina Lick, MD Taking Active   acetaminophen (TYLENOL) 325 MG tablet [id number] Yes Take 1,300 mg by mouth at bedtime. [provider] Taking Active   amLODipine (NORVASC) 5 MG tablet [id number] Yes Take 1 tablet by mouth once daily Burns, Claudina Lick, MD Taking Active   atorvastatin (LIPITOR) 10 MG tablet [id number] Yes Take 1 tablet by mouth once daily Burns, Claudina Lick, MD Taking Active   calcium carbonate (OS-CAL) 600 MG TABS tablet [id number] Yes Take 600 mg by mouth 2 (two) times daily with a meal. [provider] Taking Active   clotrimazole (LOTRIMIN) 1 % cream [id number] Yes SMARTSIG:1 Topical Daily PRN [provider] Taking Active   dicyclomine (BENTYL) 10 MG capsule [id number] Yes TAKE 1 TO 2 CAPSULES BY MOUTH THREE TIMES DAILY AS NEEDED **NEEDS  APPOINTMENT  FOR  FURTHER  REFILLS** Burns, Claudina Lick, MD Taking Active   DULoxetine (CYMBALTA) 60 MG capsule [id number] Yes Take 1 capsule (60 mg total) by mouth daily. Biagio Borg, MD Taking Active   folic acid (FOLVITE) 1 MG tablet [id number] Yes Take 1 mg by mouth daily. [provider] Taking Active   furosemide (LASIX) 20 MG tablet [id number] Yes Take 1 tablet by mouth once daily Burns, Claudina Lick, MD Taking Active   glucose blood (ACCU-CHEK GUIDE) test strip [id number] Yes Use to check sugars once daily. Dx IYME-B58.30 Binnie Rail, MD Taking Active   linaclotide Rolan Lipa) 145 MCG CAPS capsule [id number] Yes Take 1 capsule (145 mcg total) by mouth daily. Binnie Rail, MD Taking Active   methotrexate 2.5 MG tablet [id number] Yes Take 10 mg by mouth once a week.  Mondays [provider] Taking Active   Misc Natural Products (HEALTHY LIVER PO) [id number] Yes Take by mouth. [provider] Taking Active   modafinil (PROVIGIL) 100 MG tablet [id number]  Take 1 tablet (100  mg total) by mouth daily. Pucilowski, Marchia Bond, MD  Expired 10/15/20 2359   Multiple Vitamin (MULTIVITAMIN) tablet [id number] Yes Take 1 tablet by mouth daily. [provider] Taking Active   polyethylene glycol powder (GLYCOLAX/MIRALAX) 17 GM/SCOOP powder [id number] Yes Take 1 Container by mouth once. [provider] Taking Active   Probiotic Product (PROBIOTIC-10 PO) [id number] Yes Take 6 Billion Cells by mouth. [provider] Taking Active   propranolol (INDERAL) 80 MG tablet [id number] Yes Take 1 tablet by mouth twice daily Burns, Claudina Lick, MD Taking Active   pyridOXINE (VITAMIN B-6) 50 MG tablet [id number] Yes Take 1 tablet (50 mg total) by mouth daily. Thayer Headings, MD Taking Active   telmisartan (MICARDIS) 80 MG tablet [id number] Yes Take 1 tablet by mouth once daily Burns, Claudina Lick, MD Taking Active   traMADol (ULTRAM) 50 MG tablet [id number] Yes Take 1 tablet (50 mg total) by mouth every 6 (six) hours as needed. Biagio Borg, MD Taking Active             Patient Active Problem List   Diagnosis Date Noted   Mild neurocognitive disorder with Lewy bodies 01/25/2021   Rib pain on left side 01/03/2021   Vulvar itching 01/03/2021   Cervical radiculopathy 09/07/2020   Visual hallucinations    Attention deficit hyperactivity disorder 06/06/2020   Glaucoma 06/06/2020   Blepharitis 11/07/2019   Type 2 diabetes mellitus without complication, without long-term current use of insulin 10/06/2019   Hypertensive retinopathy of both eyes 10/06/2019   Iridocyclitis 10/06/2019   Lattice degeneration of both retinas 10/06/2019   Pseudophakia of both eyes 10/06/2019   Nocturnal hypoxemia 05/11/2019   Dyspnea on exertion 05/11/2019   Excessive daytime  sleepiness 05/11/2019   Primary osteoarthritis involving multiple joints 03/10/2019   Post traumatic stress disorder (PTSD)    Laryngopharyngeal reflux (LPR) 07/03/2018   Tinnitus of right ear 07/03/2018   Bilateral leg edema 06/12/2018   Degeneration of lumbar intervertebral disc 01/09/2018   Spondylolisthesis of lumbar region 01/09/2018   Fatigue 11/14/2017   Arthralgia of hip 11/14/2017   Major depressive disorder 10/08/2017   Generalized anxiety disorder 10/08/2017   Neuropathy 10/07/2017   LVH (left ventricular hypertrophy), mild 05/18/2017   S/P knee surgery 10/30/2016   Chronic low back pain 07/18/2016   Cervicalgia 05/30/2016   Neck pain 05/30/2016   Osteopenia 11/18/2015   Thrombocytopenia 05/30/2015   Pain in left knee 09/08/2014   Gout 12/11/2013   Morbid obesity 11/06/2013   Left lumbar radiculopathy 04/13/2012   Renal cell carcinoma (HCC)    IBS (irritable bowel syndrome) with chronic constipation 11/14/2010   GERD (gastroesophageal reflux disease) 11/14/2010   Pure hypercholesterolemia 03/16/2010   Lymphedema 03/15/2010   Allergic rhinitis 08/31/2009   Syncope and collapse 05/16/2009   Essential hypertension 05/06/2009    Immunization History  Administered Date(s) Administered   Fluad Quad(high Dose 65+) 03/10/2019   Influenza Split 04/09/2012   Influenza Whole 03/25/2009, 03/15/2010   Influenza,inj,Quad PF,6+ Mos 07/08/2013, 07/19/2015, 05/30/2016, 04/05/2017, 03/27/2018, 04/06/2020   PFIZER(Purple Top)SARS-COV-2 Vaccination 09/15/2019, 10/06/2019, 01/11/2021   Pneumococcal Conjugate-13 07/01/2014   Tdap 01/07/2013   Zoster Recombinat (Shingrix) 06/06/2017, 10/04/2017    Conditions to be addressed/monitored:  Hypertension, Hyperlipidemia, Depression, Anxiety, Osteopenia, and Osteoarthritis, IBS  Care Plan : [place]  Updates made by Charlton Haws, [place] since 02/02/2021 12:00 AM     Problem: Hypertension, Hyperlipidemia,  Depression, Anxiety, Osteopenia, and Osteoarthritis, IBS   Priority: High  Long-Range Goal: Disease management   Start Date: 02/02/2021  Expected End Date: 02/02/2022  This Visit's Progress: On track  Priority: High  Note:   Current Barriers:  Unable to independently monitor therapeutic efficacy Suboptimal pharmacy services - lack synchronization, delivery  Pharmacist Clinical Goal(s):  Patient will achieve adherence to monitoring guidelines and medication adherence to achieve therapeutic efficacy Improve pharmacy services through collaboration with PharmD and provider.   Interventions: 1:1 collaboration with Binnie Rail, MD regarding development and update of comprehensive plan of care as evidenced by provider attestation and co-signature Inter-disciplinary care team collaboration (see longitudinal plan of care) Comprehensive medication review performed; medication list updated in electronic medical record  Hypertension (BP goal <140/90) -Controlled - pt is not checking BP at home but BP in recent office visit has been at goal; she needs a new BP monitor -Current treatment: Amlodipine 5 mg daily AM Furosemide 20 mg daily AM Propranolol 80 mg BID  Telmisartan 80 mg daily AM -Current home readings: n/a -Denies hypotensive/hypertensive symptoms -Educated on BP goals and benefits of medications for prevention of heart attack, stroke and kidney damage; Proper BP monitoring technique; -Counseled to monitor BP at home periodically, document, and provide log at future appointments -Recommended to continue current medication  Hyperlipidemia: (LDL goal < 100) -Controlled - LDL is at goal; pt endorses compliance with statin -Current treatment: Atorvastatin 10 mg daily AM -Educated on Cholesterol goals; Benefits of statin for ASCVD risk reduction; -Recommended to continue current medication  Diabetes (A1c goal <7%) -Diet-controlled -Educated on A1c and blood sugar  goals; -Counseled on diet and exercise extensively  Depression/Anxiety/ADHD (Goal: manage symptoms) -Not ideally controlled - pt follows with psychiatry, neuropsych and recently underwent neuropsychiatric testing strongly suggesting Lewy Body dementia -pt reports her psychiatrist recently retired and she is trying to get set up with a new one -Current treatment: Duloxetine 60 mg daily AM Modafinil 100 mg daily (Pucilowski) -Medications previously tried/failed: Adderall, alprazolam, amitriptyline, escitalopram, sertraline, methylphenidate, venlafaxine -PHQ9: 16 (12/2020) -GAD7: 15 (02/2018) -Educated on Benefits of medication for symptom control -Recommended to continue current medication  Osteopenia (Goal prevent fractures) -Not ideally controlled - Pt is not taking vitamin D; she is also taking total calcium dose at once in AM; -Last DEXA Scan: 06/10/2018   T-Score femoral neck: -1.1  T-Score total hip: -0.3  10-year probability of major osteoporotic fracture: 6.1%  10-year probability of hip fracture: 0.2% -Patient is not a candidate for pharmacologic treatment -Current treatment  Calcium carbonate 600 mg - 2 tab in AM -Recommend [phone] units of vitamin D daily. Recommend 1200 mg of calcium daily from dietary and supplemental sources. -Pt is due for repeat DEXA scan -Advised to separate calcium tablets to 1 AM and 1 PM due to absorption issues  IBS (Goal: maange symptoms) -Controlled - per patient report -Current treatment  Dicyclomine 10 mg - 2 AM, 1 PM Linzess 145 mcg daily AM Miralax AM Probiotic AM -Recommended to continue current medication  Pain (Goal: manage symptoms) -Controlled -Hx osteoarthritis, lumbar disc degeneration, lumbar radiculopathy,  -Current treatment  Tylenol 325 mg PRN - 4 at HS Tramadol 50 mg q6h PRN -Recommended to continue current medication  Iridocyclitis (Goal: manage symptoms/prevent progression) -Controlled - follows with Dr  Manuella Ghazi -Current treatment  Methotrexate 2.5 mg Manuella Ghazi) - 4 once a week (Mondays) Folic acid 1 mg daily -Recommended to continue current medication  Health Maintenance -Vaccine gaps: PPSV23 -Current therapy:  Vitamin B6 Multivitamin -Patient is satisfied with current therapy and  denies issues -Recommended to continue current medication  Patient Goals/Self-Care Activities Patient will:  - take medications as prescribed focus on medication adherence by pill packs check blood pressure periodically, document, and provide at future appointments collaborate with provider on medication access solutions -Utilize [facility] for medication synchronization, packaging and delivery      Medication Assistance: None required.  Patient affirms current coverage meets needs.  Compliance/Adherence/Medication fill history: Care Gaps: Pap smear (due 08/04/17) Foot exam (due 09/02/19) A1c (due 09/07/19) DEXA scan (due 06/10/20) PPSV23 (due 07/19/20 - 65 yo)  Star-Rating Drugs: Atorvastatin - LF 01/18/21 x 90 ds Telmisartan - LF 10/27/20 x 90 ds  Patient's preferred pharmacy is:  [facility] ([place]), [place] - [address] [place] [address] Phone: [phone] Fax: [phone]  Uses pill box? Yes Pt endorses 80% compliance  We discussed: Verbal consent obtained for [facility] enhanced pharmacy services (medication synchronization, adherence packaging, delivery coordination). A medication sync plan was created to allow patient to get all medications delivered once every 30 to 90 days per patient preference. Patient understands they have freedom to choose pharmacy and clinical pharmacist will coordinate care between all prescribers and [facility].  Patient decided to: Utilize [facility] for medication synchronization, packaging and delivery  Care Plan and Follow Up Patient Decision:  Patient agrees to Care Plan  and Follow-up.  Plan: Telephone follow up appointment with care management team member scheduled for:  6 months  [name], PharmD, [place], CPP Clinical Pharmacist [facility] [phone]

## 2021-02-02 NOTE — Chronic Care Management (AMB) (Signed)
    Chronic Care Management Pharmacy Assistant   Name: Janet Le  MRN: [id number] DOB: [birth year]  Reason for Encounter: [facility] Onboard Process   Medications: Outpatient Encounter Medications as of 02/02/2021  Medication Sig   Accu-Chek FastClix Lancets MISC USE AS DIRECTED UP  TO  1 TIME  DAILY- DX CODE R73.03   acetaminophen (TYLENOL) 325 MG tablet Take 1,300 mg by mouth at bedtime.   amLODipine (NORVASC) 5 MG tablet Take 1 tablet by mouth once daily   atorvastatin (LIPITOR) 10 MG tablet Take 1 tablet by mouth once daily   calcium carbonate (OS-CAL) 600 MG TABS tablet Take 600 mg by mouth 2 (two) times daily with a meal.   clotrimazole (LOTRIMIN) 1 % cream SMARTSIG:1 Topical Daily PRN   dicyclomine (BENTYL) 10 MG capsule TAKE 1 TO 2 CAPSULES BY MOUTH THREE TIMES DAILY AS NEEDED **NEEDS  APPOINTMENT  FOR  FURTHER  REFILLS**   DULoxetine (CYMBALTA) 60 MG capsule Take 1 capsule (60 mg total) by mouth daily.   furosemide (LASIX) 20 MG tablet Take 1 tablet by mouth once daily   glucose blood (ACCU-CHEK GUIDE) test strip Use to check sugars once daily. Dx Code-R73.03   linaclotide (LINZESS) 145 MCG CAPS capsule Take 1 capsule (145 mcg total) by mouth daily.   methotrexate 2.5 MG tablet Take 10 mg by mouth once a week. Mondays   Misc Natural Products (HEALTHY LIVER PO) Take by mouth.   modafinil (PROVIGIL) 100 MG tablet Take 1 tablet (100 mg total) by mouth daily.   Multiple Vitamin (MULTIVITAMIN) tablet Take 1 tablet by mouth daily.   Probiotic Product (PROBIOTIC-10 PO) Take 6 Billion Cells by mouth.   propranolol (INDERAL) 80 MG tablet Take 1 tablet by mouth twice daily   pyridOXINE (VITAMIN B-6) 50 MG tablet Take 1 tablet (50 mg total) by mouth daily.   telmisartan (MICARDIS) 80 MG tablet Take 1 tablet by mouth once daily   traMADol (ULTRAM) 50 MG tablet Take 1 tablet (50 mg total) by mouth every 6 (six) hours as needed.   No facility-administered encounter medications on file as  of 02/02/2021.   Following appointment with [name] on 02/02/21 patient requested to onboard with [facility]. Onboard form has been completed. Contacted [facility] to coordinate transfer of patient's medications to [facility] for delivery and medication sync from [facility].   [name], CPP notified  Margaretmary Dys, [place] Pharmacy Assistant [phone]

## 2021-02-03 MED ORDER — DULOXETINE HCL 60 MG PO CPEP: 60.0000 mg | ORAL_CAPSULE | Freq: Every day | ORAL | 0 refills | Status: DC

## 2021-02-03 NOTE — Addendum Note (Signed)
Addended by: Charlton Haws on: 02/03/2021 04:38 PM   Modules accepted: Orders

## 2021-02-06 MED ORDER — ACCU-CHEK GUIDE ME W/DEVICE KIT: PACK | 0 refills | Status: DC

## 2021-02-06 NOTE — Addendum Note (Signed)
Addended by: Charlton Haws on: 02/06/2021 10:11 AM   Modules accepted: Orders

## 2021-02-07 ENCOUNTER — Ambulatory Visit: Payer: Medicare (Managed Care) | Admitting: *Deleted

## 2021-02-07 DIAGNOSIS — R4189 Other symptoms and signs involving cognitive functions and awareness: Secondary | ICD-10-CM

## 2021-02-07 DIAGNOSIS — F32A Depression, unspecified: Secondary | ICD-10-CM

## 2021-02-07 NOTE — Patient Instructions (Signed)
Visit Information  PATIENT GOALS:  Goals Addressed               This Visit's Progress     Help with depression and other needs (pt-stated)        Timeframe:  Long-Range Goal Priority:  High Start Date:01/16/21                             Expected End Date:          03/24/21             Follow Up Date 02/28/21   -referral being made to [facility] to link you with counselor and Psychiatrist as requested - begin personal counseling - join a support group - practice relaxation or meditation daily - start or continue a personal journal - practice positive thinking and self-talk    Why is this important?   When you are stressed, down or upset, your body reacts too.  For example, your blood pressure may get higher; you may have a headache or stomachache.  When your emotions get the best of you, your body's ability to fight off cold and flu gets weak.  These steps will help you manage your emotions.     Notes:         The patient verbalized understanding of instructions, educational materials, and care plan provided today and declined offer to receive copy of patient instructions, educational materials, and care plan.   Telephone follow up appointment with care management team member scheduled for:02/24/21  Eduard Clos MSW, LCSW Licensed Clinical Social Worker [place] [phone]

## 2021-02-07 NOTE — Chronic Care Management (AMB) (Signed)
Chronic Care Management    Clinical Social Work Note  02/07/2021 Name: Janet Le MRN: [id number] DOB: [birth year]  Janet Le is a 65 y.o. year old female who is a primary care patient of Burns, Claudina Lick, MD. The CCM team was consulted to assist the patient with chronic disease management and/or care coordination needs related to: [employer]  and [place] and Resources.   Engaged with patient by telephone for follow up visit in response to provider referral for social work chronic care management and care coordination services.   Consent to Services:  The patient was given information about Chronic Care Management services, agreed to services, and gave verbal consent prior to initiation of services.  Please see initial visit note for detailed documentation.   Patient agreed to services and consent obtained.   Assessment: Review of patient past medical history, allergies, medications, and health status, including review of relevant consultants reports was performed today as part of a comprehensive evaluation and provision of chronic care management and care coordination services.     SDOH (Social Determinants of Health) assessments and interventions performed:    Advanced Directives Status: See Care Plan for related entries.  CCM Care Plan  Allergies  Allergen Reactions   Sertraline Hcl Other (See Comments)    Spasms; numbness   Ace Inhibitors Cough   Codeine Palpitations   Darifenacin Hydrobromide Er Other (See Comments)    Pt states made her feel queezy & drunk   Gabapentin Other (See Comments)    Hallucinations   Pravastatin Other (See Comments)    myalgias   Propoxyphene Hcl Palpitations   Wellbutrin [Bupropion Hcl] Other (See Comments)    Numbness of mouth/lips    Outpatient Encounter Medications as of 02/07/2021  Medication Sig   Accu-Chek FastClix Lancets MISC USE AS DIRECTED UP  TO  1 TIME  DAILY- DX CODE R73.03   acetaminophen  (TYLENOL) 325 MG tablet Take 1,300 mg by mouth at bedtime.   amLODipine (NORVASC) 5 MG tablet Take 1 tablet by mouth once daily   atorvastatin (LIPITOR) 10 MG tablet Take 1 tablet by mouth once daily   Blood Glucose Monitoring Suppl (ACCU-CHEK GUIDE ME) w/Device KIT Use to check blood sugar daily   calcium carbonate (OS-CAL) 600 MG TABS tablet Take 600 mg by mouth 2 (two) times daily with a meal.   clotrimazole (LOTRIMIN) 1 % cream SMARTSIG:1 Topical Daily PRN   dicyclomine (BENTYL) 10 MG capsule TAKE 1 TO 2 CAPSULES BY MOUTH THREE TIMES DAILY AS NEEDED.   DULoxetine (CYMBALTA) 60 MG capsule Take 1 capsule (60 mg total) by mouth daily.   folic acid (FOLVITE) 1 MG tablet Take 1 mg by mouth daily.   furosemide (LASIX) 20 MG tablet Take 1 tablet by mouth once daily   glucose blood (ACCU-CHEK GUIDE) test strip Use to check sugars once daily. Dx Code-R73.03   linaclotide (LINZESS) 145 MCG CAPS capsule Take 1 capsule (145 mcg total) by mouth daily.   methotrexate 2.5 MG tablet Take 10 mg by mouth once a week. Mondays   Misc Natural Products (HEALTHY LIVER PO) Take by mouth.   modafinil (PROVIGIL) 100 MG tablet Take 1 tablet (100 mg total) by mouth daily.   Multiple Vitamin (MULTIVITAMIN) tablet Take 1 tablet by mouth daily.   polyethylene glycol powder (GLYCOLAX/MIRALAX) 17 GM/SCOOP powder Take 1 Container by mouth once.   Probiotic Product (PROBIOTIC-10 PO) Take 6 Billion Cells by mouth.   propranolol (INDERAL) 80  MG tablet Take 1 tablet by mouth twice daily   pyridOXINE (VITAMIN B-6) 50 MG tablet Take 1 tablet (50 mg total) by mouth daily.   telmisartan (MICARDIS) 80 MG tablet Take 1 tablet by mouth once daily   traMADol (ULTRAM) 50 MG tablet Take 1 tablet (50 mg total) by mouth every 6 (six) hours as needed.   No facility-administered encounter medications on file as of 02/07/2021.    Patient Active Problem List   Diagnosis Date Noted   Mild neurocognitive disorder with Lewy bodies 01/25/2021    Rib pain on left side 01/03/2021   Vulvar itching 01/03/2021   Cervical radiculopathy 09/07/2020   Visual hallucinations    Attention deficit hyperactivity disorder 06/06/2020   Glaucoma 06/06/2020   Blepharitis 11/07/2019   Type 2 diabetes mellitus without complication, without long-term current use of insulin 10/06/2019   Hypertensive retinopathy of both eyes 10/06/2019   Iridocyclitis 10/06/2019   Lattice degeneration of both retinas 10/06/2019   Pseudophakia of both eyes 10/06/2019   Nocturnal hypoxemia 05/11/2019   Dyspnea on exertion 05/11/2019   Excessive daytime sleepiness 05/11/2019   Primary osteoarthritis involving multiple joints 03/10/2019   Post traumatic stress disorder (PTSD)    Laryngopharyngeal reflux (LPR) 07/03/2018   Tinnitus of right ear 07/03/2018   Bilateral leg edema 06/12/2018   Degeneration of lumbar intervertebral disc 01/09/2018   Spondylolisthesis of lumbar region 01/09/2018   Fatigue 11/14/2017   Arthralgia of hip 11/14/2017   Major depressive disorder 10/08/2017   Generalized anxiety disorder 10/08/2017   Neuropathy 10/07/2017   LVH (left ventricular hypertrophy), mild 05/18/2017   S/P knee surgery 10/30/2016   Chronic low back pain 07/18/2016   Cervicalgia 05/30/2016   Neck pain 05/30/2016   Osteopenia 11/18/2015   Thrombocytopenia 05/30/2015   Pain in left knee 09/08/2014   Gout 12/11/2013   Morbid obesity 11/06/2013   Left lumbar radiculopathy 04/13/2012   Renal cell carcinoma (HCC)    IBS (irritable bowel syndrome) with chronic constipation 11/14/2010   GERD (gastroesophageal reflux disease) 11/14/2010   Pure hypercholesterolemia 03/16/2010   Lymphedema 03/15/2010   Allergic rhinitis 08/31/2009   Syncope and collapse 05/16/2009   Essential hypertension 05/06/2009    Conditions to be addressed/monitored: Anxiety, Depression, and Dementia; Limited social support, Mental Health Concerns , and Cognitive Deficits  Care Plan : LCSW  Plan of Care  Updates made by Deirdre Peer, LCSW since 02/07/2021 12:00 AM     Problem: Symptoms (Depression)      Long-Range Goal: Provide support, resources and opportunity for reduction of depression and improvement of quality of life   Start Date: 01/16/2021  Expected End Date: 03/24/2021  This Visit's Progress: On track  Recent Progress: On track  Priority: High  Note:   Current barriers:   Patient in need of assistance with connecting to community resources for Limited social support, Transportation, Mental Health Concerns , Family and relationship dysfunction, Social Isolation, Limited access to caregiver, Inability to perform ADL's independently, and Lacks knowledge of community resources Acknowledges deficits with meeting this unmet need Patient is unable to independently navigate community resource options without care coordination support Clinical Goals:  patient will work with SW to address concerns related to depression, social isolation, community resource links/support Clinical Interventions:  CSW called pt today who shared she was seen by the Neuropscyh and test results were shared with her; "I don't want to say it right now".  CSW noted in chart record the findings; consistent with cognitive impairment (  possibly Lewy Body dementia).  Pt offered support and an opportunity to talk and express her emotions but is rather quiet and reserved. CSW inquired about the Counseling support referral made and she does not recall hearing from anyone; although the referral records indicate they spoke with her and provided her with the contact info to arrange appointment.  CSW suggested pt keep a paper calendar where she can write down all appointments and notes as needed. Pt also reports she has a HCPOA in place; "[place]". Will seek copy from EMR or seek copy from pt.   Pt denies any current SI/HI. Pt is also mentioned plans to start receiving her meds in a "packet" to help with organization  and confirmation of compliance. She thinks this will begin next month.  Pt voices interest in getting "more help at home". Pt is unsure of her insurance coverage/plans offering any sort of paid caregiver support- unsure if she is getting Medicaid- will ask Care Guide to help in seeking options for her to obtain in home care/support.    Collaboration with Binnie Rail, MD regarding development and update of comprehensive plan of care as evidenced by provider attestation and co-signature Inter-disciplinary care team collaboration (see longitudinal plan of care) Assessment of needs, barriers , agencies contacted, as well as how impacting  Review various resources, discussed options and provided patient information about Limited social support, Transportation, Mental Health Concerns , Family and relationship dysfunction, Social Isolation, and Lacks knowledge of community resources Collaborated with appropriate clinical care team members regarding patient needs Limited social support, Transportation, Mental Health Concerns , Family and relationship dysfunction, Social Isolation, Limited access to caregiver, Memory Deficits, and Lacks knowledge of community resources, Anxiety and Depression Patient interviewed and appropriate assessments performed Referred patient to community resources care guide team for assistance with transportation,  Provided mental health counseling with regard to depression, anxiety, PTSD and grief/loss  Provided patient with information about depression, PTSD, bereavement and overall apathy that she appears to be facing. Discussed plans with patient for ongoing care management follow up and provided patient with direct contact information for care management team Referred patient to [facility] (mental health provider) for arranging in-network providers for long term follow up and therapy/counseling and Psychiatry for medication assist Other interventions provided: Depression screen  reviewed , PHQ2/ PHQ9 completed, Solution-Focused Strategies, Active listening / Reflection utilized , Emotional Supportive Provided, Problem [place] , Reviewed mental health medications with patient and discussed compliance, Participation in counseling encouraged , Participation in support group encouraged , Increase in actives / exercise encouraged , and Discussed referral to [facility] to assist with connecting to mental health provider Patient Goals:  -referral being made to [facility] to link you with counselor and Psychiatrist as requested - begin personal counseling - join a support group - practice relaxation or meditation daily - start or continue a personal journal - practice positive thinking and self-talk   Follow Up Plan: Appointment scheduled for SW follow up with client by phone on:  02/24/21      Follow Up Plan: Appointment scheduled for SW follow up with client by phone on: 02/24/21 Eduard Clos MSW, [place] Licensed Clinical Social Worker [place] [phone]

## 2021-02-09 ENCOUNTER — Ambulatory Visit: Payer: Medicare (Managed Care) | Admitting: *Deleted

## 2021-02-09 DIAGNOSIS — I10 Essential (primary) hypertension: Secondary | ICD-10-CM

## 2021-02-09 DIAGNOSIS — R7303 Prediabetes: Secondary | ICD-10-CM

## 2021-02-09 DIAGNOSIS — R4189 Other symptoms and signs involving cognitive functions and awareness: Secondary | ICD-10-CM

## 2021-02-09 NOTE — Chronic Care Management (AMB) (Signed)
Chronic Care Management   CCM RN Visit Note  02/09/2021 Name: Janet Le MRN: [id number] DOB: 1956/06/08  Subjective: Janet Le is a 65 y.o. year old female who is a primary care patient of Burns, Claudina Lick, MD. The care management team was consulted for assistance with disease management and care coordination needs.    Engaged with patient by telephone for initial visit in response to provider referral for case management and/or care coordination services.   Consent to Services:  The patient was given information about Chronic Care Management services, agreed to services, and gave verbal consent prior to initiation of services.  Please see initial visit note for detailed documentation.  Patient agreed to services and verbal consent obtained.   Assessment: Review of patient past medical history, allergies, medications, health status, including review of consultants reports, laboratory and other test data, was performed as part of comprehensive evaluation and provision of chronic care management services.   SDOH (Social Determinants of Health) assessments and interventions performed:  already obtained and being addressed by CCM CSW team: no changes noted  CCM Care Plan Allergies  Allergen Reactions   Sertraline Hcl Other (See Comments)    Spasms; numbness   Ace Inhibitors Cough   Codeine Palpitations   Darifenacin Hydrobromide Er Other (See Comments)    Pt states made her feel queezy & drunk   Gabapentin Other (See Comments)    Hallucinations   Pravastatin Other (See Comments)    myalgias   Propoxyphene Hcl Palpitations   Wellbutrin [Bupropion Hcl] Other (See Comments)    Numbness of mouth/lips   Outpatient Encounter Medications as of 02/09/2021  Medication Sig   Accu-Chek FastClix Lancets MISC USE AS DIRECTED UP  TO  1 TIME  DAILY- DX CODE R73.03   acetaminophen (TYLENOL) 325 MG tablet Take 1,300 mg by mouth at bedtime.   amLODipine (NORVASC) 5 MG tablet Take 1  tablet by mouth once daily   atorvastatin (LIPITOR) 10 MG tablet Take 1 tablet by mouth once daily   Blood Glucose Monitoring Suppl (ACCU-CHEK GUIDE ME) w/Device KIT Use to check blood sugar daily   calcium carbonate (OS-CAL) 600 MG TABS tablet Take 600 mg by mouth 2 (two) times daily with a meal.   clotrimazole (LOTRIMIN) 1 % cream SMARTSIG:1 Topical Daily PRN   dicyclomine (BENTYL) 10 MG capsule TAKE 1 TO 2 CAPSULES BY MOUTH THREE TIMES DAILY AS NEEDED.   DULoxetine (CYMBALTA) 60 MG capsule Take 1 capsule (60 mg total) by mouth daily.   folic acid (FOLVITE) 1 MG tablet Take 1 mg by mouth daily.   furosemide (LASIX) 20 MG tablet Take 1 tablet by mouth once daily   glucose blood (ACCU-CHEK GUIDE) test strip Use to check sugars once daily. Dx Code-R73.03   linaclotide (LINZESS) 145 MCG CAPS capsule Take 1 capsule (145 mcg total) by mouth daily.   methotrexate 2.5 MG tablet Take 10 mg by mouth once a week. Mondays   Misc Natural Products (HEALTHY LIVER PO) Take by mouth.   modafinil (PROVIGIL) 100 MG tablet Take 1 tablet (100 mg total) by mouth daily.   Multiple Vitamin (MULTIVITAMIN) tablet Take 1 tablet by mouth daily.   polyethylene glycol powder (GLYCOLAX/MIRALAX) 17 GM/SCOOP powder Take 1 Container by mouth once.   Probiotic Product (PROBIOTIC-10 PO) Take 6 Billion Cells by mouth.   propranolol (INDERAL) 80 MG tablet Take 1 tablet by mouth twice daily   pyridOXINE (VITAMIN B-6) 50 MG tablet Take 1 tablet (50  mg total) by mouth daily.   telmisartan (MICARDIS) 80 MG tablet Take 1 tablet by mouth once daily   traMADol (ULTRAM) 50 MG tablet Take 1 tablet (50 mg total) by mouth every 6 (six) hours as needed.   No facility-administered encounter medications on file as of 02/09/2021.   Patient Active Problem List   Diagnosis Date Noted   Mild neurocognitive disorder with Lewy bodies 01/25/2021   Rib pain on left side 01/03/2021   Vulvar itching 01/03/2021   Cervical radiculopathy 09/07/2020    Visual hallucinations    Attention deficit hyperactivity disorder 06/06/2020   Glaucoma 06/06/2020   Blepharitis 11/07/2019   Type 2 diabetes mellitus without complication, without long-term current use of insulin 10/06/2019   Hypertensive retinopathy of both eyes 10/06/2019   Iridocyclitis 10/06/2019   Lattice degeneration of both retinas 10/06/2019   Pseudophakia of both eyes 10/06/2019   Nocturnal hypoxemia 05/11/2019   Dyspnea on exertion 05/11/2019   Excessive daytime sleepiness 05/11/2019   Primary osteoarthritis involving multiple joints 03/10/2019   Post traumatic stress disorder (PTSD)    Laryngopharyngeal reflux (LPR) 07/03/2018   Tinnitus of right ear 07/03/2018   Bilateral leg edema 06/12/2018   Degeneration of lumbar intervertebral disc 01/09/2018   Spondylolisthesis of lumbar region 01/09/2018   Fatigue 11/14/2017   Arthralgia of hip 11/14/2017   Major depressive disorder 10/08/2017   Generalized anxiety disorder 10/08/2017   Neuropathy 10/07/2017   LVH (left ventricular hypertrophy), mild 05/18/2017   S/P knee surgery 10/30/2016   Chronic low back pain 07/18/2016   Cervicalgia 05/30/2016   Neck pain 05/30/2016   Osteopenia 11/18/2015   Thrombocytopenia 05/30/2015   Pain in left knee 09/08/2014   Gout 12/11/2013   Morbid obesity 11/06/2013   Left lumbar radiculopathy 04/13/2012   Renal cell carcinoma (HCC)    IBS (irritable bowel syndrome) with chronic constipation 11/14/2010   GERD (gastroesophageal reflux disease) 11/14/2010   Pure hypercholesterolemia 03/16/2010   Lymphedema 03/15/2010   Allergic rhinitis 08/31/2009   Syncope and collapse 05/16/2009   Essential hypertension 05/06/2009   Conditions to be addressed/monitored:  HTN, DMII, and cognitive decline  Care Plan : RN Care Manager Plan of Care  Updates made by Knox Royalty, RN since 02/09/2021 12:00 AM     Problem: Chronic Disease Management Needs   Priority: Medium     Long-Range Goal:  Development of plan of care for long term chronic disease management   Start Date: 02/09/2021  Expected End Date: 02/09/2022  Priority: Medium  Note:   Current Barriers:  Chronic Disease Management support and education needs related to HTN, DMII, and cognitive decline/ impairment Patient lives alone- reports limited ability to care for self in setting of progressive cognitive decline with recent Lewy Body diagnosis Multiple community resource/ social work needs: Engineer, agricultural and CCM CSW active Medication management in setting of cognitive decline: CCM Pharmacy team active Lack of focus/ ability to convey thought patterns in clear manner  RNCM Clinical Goal(s):  Patient will continue to work with Consulting civil engineer to address care management and care coordination needs related to HTN, DMII, and cognitive decline  through collaboration with Consulting civil engineer, provider, and care team.   Interventions: 1:1 collaboration with primary care provider regarding development and update of comprehensive plan of care as evidenced by provider attestation and co-signature Inter-disciplinary care team collaboration (see longitudinal plan of care) Evaluation of current treatment plan related to  self management and patient's adherence to plan  as established by provider  Diabetes:  (Status: New goal.) Lab Results  Component Value Date   HGBA1C 5.7 03/10/2019  Assessed patient's understanding of A1c goal:  patient unable to verbalize; verified no recent A1-C results (5.7 03/10/2019) Provided education to patient about basic DM disease process; Reviewed prescribed diet with patient carb modified, low sugar, heart healthy, low salt; Counseled on importance of regular laboratory monitoring as prescribed;        Discussed plans with patient for ongoing care management follow up and provided patient with direct contact information for care management team;      Review of patient status, including  review of consultants reports, relevant laboratory and other test results, and medications completed;       Discussed with patient blood sugar monitoring at home: she reports she obtained glucometer from CCM pharmacist- however, reports she is unable to see numbers on screen, states "they are too small, I can't read them;" requests that another/ different glucometer be provided/ prescribed for her- will message CCM Pharmacist as FYI Confirmed patient has not yet started monitoring blood sugars at home, as above Discussed significance of A1-C values: reviewed individual patient trends with her; discussed need for updated A1-C level; she reports does not have scheduled PCP office visit, but will consider scheduling in future  Hypertension: (Status: New goal.) Last practice recorded BP readings:  BP Readings from Last 3 Encounters:  02/02/21 106/60  01/13/21 136/76  01/03/21 (!) 142/76  Most recent eGFR/CrCl:  Lab Results  Component Value Date   EGFR 82 (L) 02/28/2017    No components found for: CRCL  Evaluation of current treatment plan related to hypertension self management and patient's adherence to plan as established by provider;   Discussed plans with patient for ongoing care management follow up and provided patient with direct contact information for care management team; Discussed complications of poorly controlled blood pressure such as heart disease, stroke, circulatory complications, vision complications, kidney impairment, sexual dysfunction;  Confirmed patient does not monitor blood pressures at home- reports takes medications Denies clinical concerns; reports multiple falls without serious injury over last few months- fall prevention discussed Reviewed upcoming provider appointments with patient: CCM CSW 02/24/21; neurology provider 04/21/21: patient states she is currently using transportation reosurces, previously provided by CCM care guide Confirmed patient is active with CCM  CSW/ [employer] Care Guide: she is asking me about follow up on her stated need for personal care services through medicaid; states she "thinks" she has medicaid, but "is not sure;" she shares that she has been talking to [facility] guide team/ CSW about this matter, as well as need for Meals on Wheels-- patient eventually remembers that she was told "by someone" that she is on the waiting list Care Coordination Outreach to Eduard Clos, CCM CSW: updated Marcie Bal on my conversation with patient today and she confirms that she has placed another care guide referral re: possibility of personal care services resources/ in-home care assistance  Patient Goals/Self-Care Activities: Patient will self administer medications as prescribed Patient will attend all scheduled provider appointments Patient will call pharmacy for medication refills Patient will call provider office for new concerns or questions Patient will maintain communication with CCM team: CSW, care guide, pharmacy, and RN CM      Plan: Telephone follow up appointment with care management team member scheduled for:  Monday March 20, 2021 at 3:30 pm The patient has been provided with contact information for the care management team and has  been advised to call with any health related questions or concerns.   [name], RN, BSN, [facility] RN Care Coordination- [place] [phone]: direct office [phone]: mobile

## 2021-02-09 NOTE — Patient Instructions (Signed)
Visit Information   Janet Le, it was nice talking with you today.   Please read over the attached information, and try to make small changes to your diet to help keep your blood sugars and blood pressures under control   I look forward to talking to you again for an update on Monday, March 20, 2021 at 3:30 pm- please be listening out for my call that day.  I will call as close to 3:30 pm as possible.   If you need to cancel or re-schedule our telephone visit, please call [phone] and one of our care guides will be happy to assist you.   I look forward to hearing about your progress.   Please don't hesitate to contact me if I can be of assistance to you before our next scheduled telephone appointment.   [name], RN, BSN, [facility] RN Care Coordination- [place] [phone]: direct office [phone]: mobile   PATIENT GOALS:   Goals Addressed             This Visit's Progress    Patient Self-Care Activities   On track    Timeframe:  Long-Range Goal Priority:  Medium Start Date:         02/09/21                    Expected End Date:      02/09/22                 Patient will self administer medications as prescribed Patient will attend all scheduled provider appointments Patient will call pharmacy for medication refills Patient will call provider office for new concerns or questions Patient will maintain communication with CCM team: CSW, care guide, pharmacy, and RN CM       [email or web address]">  DASH Eating Plan DASH stands for Dietary Approaches to Stop Hypertension. The DASH eating plan is a healthy eating plan that has been shown to: Reduce high blood pressure (hypertension). Reduce your risk for type 2 diabetes, heart disease, and stroke. Help with weight loss. What are tips for following this plan? Reading food labels Check food labels for the amount of salt (sodium) per  serving. Choose foods with less than 5 percent of the Daily Value of sodium. Generally, foods with less than 300 milligrams (mg) of sodium per serving fit into this eating plan. To find whole grains, look for the word "whole" as the first word in the ingredient list. Shopping Buy products labeled as "low-sodium" or "no salt added." Buy fresh foods. Avoid canned foods and pre-made or frozen meals. Cooking Avoid adding salt when cooking. Use salt-free seasonings or herbs instead of table salt or sea salt. Check with your health care provider or pharmacist before using salt substitutes. Do not fry foods. Cook foods using healthy methods such as baking, boiling, grilling, roasting, and broiling instead. Cook with heart-healthy oils, such as olive, canola, avocado, soybean, or sunflower oil. Meal planning  Eat a balanced diet that includes: 4 or more servings of fruits and 4 or more servings of vegetables each day. Try to fill one-half of your plate with fruits and vegetables. 6-8 servings of whole grains each day. Less than 6 oz (170 g) of lean meat, poultry, or fish each day. A 3-oz (85-g) serving of meat is about the same size as a deck of cards. One egg equals 1 oz (28 g). 2-3 servings of low-fat dairy each day. One serving is  1 cup (237 mL). 1 serving of nuts, seeds, or beans 5 times each week. 2-3 servings of heart-healthy fats. Healthy fats called omega-3 fatty acids are found in foods such as walnuts, flaxseeds, fortified milks, and eggs. These fats are also found in cold-water fish, such as sardines, salmon, and mackerel. Limit how much you eat of: Canned or prepackaged foods. Food that is high in trans fat, such as some fried foods. Food that is high in saturated fat, such as fatty meat. Desserts and other sweets, sugary drinks, and other foods with added sugar. Full-fat dairy products. Do not salt foods before eating. Do not eat more than 4 egg yolks a week. Try to eat at least 2  vegetarian meals a week. Eat more home-cooked food and less restaurant, buffet, and fast food.  Lifestyle When eating at a restaurant, ask that your food be prepared with less salt or no salt, if possible. If you drink alcohol: Limit how much you use to: 0-1 drink a day for women who are not pregnant. 0-2 drinks a day for men. Be aware of how much alcohol is in your drink. In the [place] one drink equals one 12 oz bottle of beer (355 mL), one 5 oz glass of wine (148 mL), or one 1 oz glass of hard liquor (44 mL). General information Avoid eating more than 2,300 mg of salt a day. If you have hypertension, you may need to reduce your sodium intake to 1,500 mg a day. Work with your health care provider to maintain a healthy body weight or to lose weight. Ask what an ideal weight is for you. Get at least 30 minutes of exercise that causes your heart to beat faster (aerobic exercise) most days of the week. Activities may include walking, swimming, or biking. Work with your health care provider or dietitian to adjust your eating plan to your individual calorie needs. What foods should I eat? Fruits All fresh, dried, or frozen fruit. Canned fruit in natural juice (without addedsugar). Vegetables Fresh or frozen vegetables (raw, steamed, roasted, or grilled). Low-sodium or reduced-sodium tomato and vegetable juice. Low-sodium or reduced-sodium tomatosauce and tomato paste. Low-sodium or reduced-sodium canned vegetables. Grains Whole-grain or whole-wheat bread. Whole-grain or whole-wheat pasta. Brown rice. [place]. Bulgur. Whole-grain and low-sodium cereals. Pita bread.Low-fat, low-sodium crackers. Whole-wheat flour tortillas. Meats and other proteins Skinless chicken or [place]. Ground chicken or [place]. Pork with fat trimmed off. Fish and seafood. Egg whites. Dried beans, peas, or lentils. Unsalted nuts, nut butters, and seeds. Unsalted canned beans. Lean cuts of beef with fat trimmed off.  Low-sodium, lean precooked or cured meat, such as sausages or meatloaves. Dairy Low-fat (1%) or fat-free (skim) milk. Reduced-fat, low-fat, or fat-free cheeses. Nonfat, low-sodium ricotta or cottage cheese. Low-fat or nonfatyogurt. Low-fat, low-sodium cheese. Fats and oils Soft margarine without trans fats. Vegetable oil. Reduced-fat, low-fat, or light mayonnaise and salad dressings (reduced-sodium). Canola, safflower, olive, avocado, soybean, andsunflower oils. Avocado. Seasonings and condiments Herbs. Spices. Seasoning mixes without salt. Other foods Unsalted popcorn and pretzels. Fat-free sweets. The items listed above may not be a complete list of foods and beverages you can eat. Contact a dietitian for more information. What foods should I avoid? Fruits Canned fruit in a light or heavy syrup. Fried fruit. Fruit in cream or buttersauce. Vegetables Creamed or fried vegetables. Vegetables in a cheese sauce. Regular canned vegetables (not low-sodium or reduced-sodium). Regular canned tomato sauce and paste (not low-sodium or reduced-sodium). Regular tomato and vegetable juice(not low-sodium  or reduced-sodium). Angie Fava. Olives. Grains Baked goods made with fat, such as croissants, muffins, or some breads. Drypasta or rice meal packs. Meats and other proteins Fatty cuts of meat. Ribs. Fried meat. Berniece Salines. Bologna, salami, and other precooked or cured meats, such as sausages or meat loaves. Fat from the back of a pig (fatback). Bratwurst. Salted nuts and seeds. Canned beans with added salt. Canned orsmoked fish. Whole eggs or egg yolks. Chicken or [place] with skin. Dairy Whole or 2% milk, cream, and half-and-half. Whole or full-fat cream cheese. Whole-fat or sweetened yogurt. Full-fat cheese. Nondairy creamers. Whippedtoppings. Processed cheese and cheese spreads. Fats and oils Butter. Stick margarine. Lard. Shortening. Ghee. Bacon fat. Tropical oils, suchas coconut, palm kernel, or palm  oil. Seasonings and condiments Onion salt, garlic salt, seasoned salt, table salt, and sea salt. Worcestershire sauce. Tartar sauce. Barbecue sauce. Teriyaki sauce. Soy sauce, including reduced-sodium. Steak sauce. Canned and packaged gravies. Fish sauce. Oyster sauce. Cocktail sauce. Store-bought horseradish. Ketchup. Mustard. Meat flavorings and tenderizers. Bouillon cubes. Hot sauces. Pre-made or packaged marinades. Pre-made or packaged taco seasonings. Relishes. Regular saladdressings. Other foods Salted popcorn and pretzels. The items listed above may not be a complete list of foods and beverages you should avoid. Contact a dietitian for more information. Where to find more information National Heart, Lung, and Blood Institute: https://wilson-eaton.com/ American Heart Association: www.heart.org Academy of Nutrition and Dietetics: www.eatright.[place]: www.kidney.org Summary The DASH eating plan is a healthy eating plan that has been shown to reduce high blood pressure (hypertension). It may also reduce your risk for type 2 diabetes, heart disease, and stroke. When on the DASH eating plan, aim to eat more fresh fruits and vegetables, whole grains, lean proteins, low-fat dairy, and heart-healthy fats. With the DASH eating plan, you should limit salt (sodium) intake to 2,300 mg a day. If you have hypertension, you may need to reduce your sodium intake to 1,500 mg a day. Work with your health care provider or dietitian to adjust your eating plan to your individual calorie needs. This information is not intended to replace advice given to you by your health care provider. Make sure you discuss any questions you have with your healthcare provider. Document Revised: 05/15/2019 Document Reviewed: 05/15/2019 Elsevier Patient Education  2022 [place].  Preventing Type 2 Diabetes Mellitus Type 2 diabetes, also called type 2 diabetes mellitus, is a long-term (chronic) disease that  affects sugar (glucose) levels in your blood. Normally, a hormone called insulin allows glucose to enter cells in your body. The cells use glucose for energy. With type 2 diabetes, you will have one or both of these problems: Your pancreas does not make enough insulin. Cells in your body do not respond properly to insulin that your body makes (insulin resistance). Insulin resistance or lack of insulin causes extra glucose to build up in the blood instead of going into cells. As a result, high blood glucose (hyperglycemia) develops. That can cause many complications. Being overweight or obese and having an inactive (sedentary) lifestyle can increase your risk for diabetes. Type 2 diabetes can be delayedor prevented by making certain nutrition and lifestyle changes. How can this condition affect me? If you do not take steps to prevent diabetes, your blood glucose levels may keep increasing over time. Too much glucose in your blood for a long time candamage your blood vessels, heart, kidneys, nerves, and eyes. Type 2 diabetes can lead to chronic health problems and complications, such as: Heart disease. Stroke. Blindness.  Kidney disease. Depression. Poor circulation in your feet and legs. In severe cases, a foot or leg may need to be surgically removed (amputated). What can increase my risk? You may be more likely to develop type 2 diabetes if you: Have type 2 diabetes in your family. Are overweight or obese. Have a sedentary lifestyle. Have insulin resistance or a history of prediabetes. Have a history of pregnancy-related (gestational) diabetes or polycystic ovary syndrome (PCOS). What actions can I take to prevent this? It can be difficult to recognize signs of type 2 diabetes. Taking action to prevent the disease before you develop symptoms is the best way to avoid possible damage to your body. Making certain nutrition and lifestyle changesmay prevent or delay the disease and related health  problems. Nutrition  Eat healthy meals and snacks regularly. Do not skip meals. Fruit or a handful of nuts is a healthy snack between meals. Drink water throughout the day. Avoid drinks that contain added sugar, such as soda or sweetened tea. Drink enough fluid to keep your urine pale yellow. Follow instructions from your health care provider about eating or drinking restrictions. Limit the amount of food you eat by: Controlling how much you eat at a time (portion size). Checking food labels for the serving sizes of food. Using a kitchen scale to weigh amounts of food. Saut or steam food instead of frying it. Cook with water or broth instead of oils or butter. Limit saturated fat and salt (sodium) in your diet. Have no more than 1 tsp (2,400 mg) of sodium a day. If you have heart disease or high blood pressure, use less than ? tsp (1,500 mg) of sodium a day.  Lifestyle  Lose weight if needed and as told. Your health care provider can determine how much weight loss is best for you and can help you lose weight safely. If you are overweight or obese, you may be told to lose at least 5?7% of your body weight. Manage blood pressure, cholesterol, and stress. Your health care provider will help determine the best treatment for you. Do not use any products that contain nicotine or tobacco, such as cigarettes, e-cigarettes, and chewing tobacco. If you need help quitting, ask your health care provider.  Activity  Do physical activity that makes your heart beat faster and makes you sweat (moderate intensity). Do this for at least 30 minutes on at least 5 days of the week, or as much as told by your health care provider. Ask your health care provider what activities are safe for you. A mix of activities may be best, such as walking, swimming, cycling, and strength training. Try to add physical activity into your day. For example: Park your car farther away than usual so that you walk more. Take a  walk during your lunch break. Use stairs instead of elevators or escalators. Walk or bike to work instead of driving.  Alcohol use If you drink alcohol: Limit how much you use to: 0?1 drink a day for women who are not pregnant. 0?2 drinks a day for men. Be aware of how much alcohol is in your drink. In the [place]., one drink equals one 12 oz bottle of beer (355 mL), one 5 oz glass of wine (148 mL), or one 1 oz glass of hard liquor (44 mL). General information Talk with your health care provider about your risk factors and how you can reduce your risk for diabetes. Have your blood glucose tested regularly, as told by your  health care provider. Get screening tests as told by your health care provider. You may have these regularly, especially if you have certain risk factors for type 2 diabetes. Make an appointment with a registered dietitian. This diet and nutrition specialist can help you make a healthy eating plan and help you understand portion sizes and food labels. Where to find support Ask your health care provider to recommend a registered dietitian, a certified diabetes care and education specialist, or a weight loss program. Look for local or online weight loss groups. Join a gym, fitness club, or outdoor activity group, such as a walking club. Where to find more information To learn more about diabetes and diabetes prevention, visit: American Diabetes Association (ADA): www.diabetes.[employer] of Diabetes and Digestive and Kidney Diseases: [place]inesFuneral.dk To learn more about healthy eating, visit: U.S. Department of Agriculture Scientist, research (physical sciences)): FormerBoss.no Office of Disease Prevention and Health Promotion (ODPHP): [place].be Summary You can delay or prevent type 2 diabetes by eating healthy foods, losing weight if needed, and increasing your physical activity. Talk with your health care provider about your risk factors for type 2 diabetes and how you can reduce your  risk. It can be difficult to recognize the signs of type 2 diabetes. The best way to avoid possible damage to your body is to take action to prevent the disease before you develop symptoms. Get screening tests as told by your health care provider. This information is not intended to replace advice given to you by your health care provider. Make sure you discuss any questions you have with your healthcare provider. Document Revised: 05/25/2020 Document Reviewed: 01/06/2020 Elsevier Patient Education  2022 [employer].  Consent to CCM Services: Ms. Chambers was given information about Chronic Care Management services including:  CCM service includes personalized support from designated clinical staff supervised by her physician, including individualized plan of care and coordination with other care providers 24/7 contact phone numbers for assistance for urgent and routine care needs. Service will only be billed when office clinical staff spend 20 minutes or more in a month to coordinate care. Only one practitioner may furnish and bill the service in a calendar month. The patient may stop CCM services at any time (effective at the end of the month) by phone call to the office staff. The patient will be responsible for cost sharing (co-pay) of up to 20% of the service fee (after annual deductible is met).  Patient agreed to services and verbal consent obtained.   The patient verbalized understanding of instructions, educational materials, and care plan provided today and agreed to receive a mailed copy of patient instructions, educational materials, and care plan.  Telephone follow up appointment with care management team member scheduled for:  Monday March 20, 2021 at 3:30 pm The patient has been provided with contact information for the care management team and has been advised to call with any health related questions or concerns.   [name], RN, BSN, [facility]  RN Care Coordination- [place] [phone]: direct office [phone]: mobile    CLINICAL CARE PLAN: Patient Care Plan: LCSW Plan of Care     Problem Identified: Symptoms (Depression)      Long-Range Goal: Provide support, resources and opportunity for reduction of depression and improvement of quality of life   Start Date: 01/16/2021  Expected End Date: 03/24/2021  This Visit's Progress: On track  Recent Progress: On track  Priority: High  Note:   Current barriers:  Patient in need of assistance with connecting to community resources for Limited social support, Transportation, Mental Health Concerns , Family and relationship dysfunction, Social Isolation, Limited access to caregiver, Inability to perform ADL's independently, and Lacks knowledge of community resources Acknowledges deficits with meeting this unmet need Patient is unable to independently navigate community resource options without care coordination support Clinical Goals:  patient will work with SW to address concerns related to depression, social isolation, community resource links/support Clinical Interventions:  CSW called pt today who shared she was seen by the Neuropscyh and test results were shared with her; "I don't want to say it right now".  CSW noted in chart record the findings; consistent with cognitive impairment (possibly Lewy Body dementia).  Pt offered support and an opportunity to talk and express her emotions but is rather quiet and reserved. CSW inquired about the Counseling support referral made and she does not recall hearing from anyone; although the referral records indicate they spoke with her and provided her with the contact info to arrange appointment.  CSW suggested pt keep a paper calendar where she can write down all appointments and notes as needed. Pt also reports she has a HCPOA in place; "[place]". Will seek copy from EMR or seek copy from pt.   Pt denies any current SI/HI. Pt  is also mentioned plans to start receiving her meds in a "packet" to help with organization and confirmation of compliance. She thinks this will begin next month.  Pt voices interest in getting "more help at home". Pt is unsure of her insurance coverage/plans offering any sort of paid caregiver support- unsure if she is getting Medicaid- will ask Care Guide to help in seeking options for her to obtain in home care/support.    Collaboration with Binnie Rail, MD regarding development and update of comprehensive plan of care as evidenced by provider attestation and co-signature Inter-disciplinary care team collaboration (see longitudinal plan of care) Assessment of needs, barriers , agencies contacted, as well as how impacting  Review various resources, discussed options and provided patient information about Limited social support, Transportation, Mental Health Concerns , Family and relationship dysfunction, Social Isolation, and Lacks knowledge of community resources Collaborated with appropriate clinical care team members regarding patient needs Limited social support, Transportation, Mental Health Concerns , Family and relationship dysfunction, Social Isolation, Limited access to caregiver, Memory Deficits, and Lacks knowledge of community resources, Anxiety and Depression Patient interviewed and appropriate assessments performed Referred patient to community resources care guide team for assistance with transportation,  Provided mental health counseling with regard to depression, anxiety, PTSD and grief/loss  Provided patient with information about depression, PTSD, bereavement and overall apathy that she appears to be facing. Discussed plans with patient for ongoing care management follow up and provided patient with direct contact information for care management team Referred patient to [place] (mental health provider) for arranging in-network providers for long term follow up and  therapy/counseling and Psychiatry for medication assist Other interventions provided: Depression screen reviewed , PHQ2/ PHQ9 completed, Solution-Focused Strategies, Active listening / Reflection utilized , Emotional Supportive Provided, Problem [place] , Reviewed mental health medications with patient and discussed compliance, Participation in counseling encouraged , Participation in support group encouraged , Increase in actives / exercise encouraged , and Discussed referral to [place] to assist with connecting to mental health provider Patient Goals:  -referral being made to [place] to link you with counselor and Psychiatrist as requested - begin personal counseling - join a  support group - practice relaxation or meditation daily - start or continue a personal journal - practice positive thinking and self-talk   Follow Up Plan: Appointment scheduled for SW follow up with client by phone on:  02/24/21    Patient Care Plan: [place] Plan     Problem Identified: Hypertension, Hyperlipidemia, Depression, Anxiety, Osteopenia, and Osteoarthritis, IBS   Priority: High     Long-Range Goal: Disease management   Start Date: 02/02/2021  Expected End Date: 02/02/2022  This Visit's Progress: On track  Priority: High  Note:   Current Barriers:  Unable to independently monitor therapeutic efficacy Suboptimal pharmacy services - lack synchronization, delivery  Pharmacist Clinical Goal(s):  Patient will achieve adherence to monitoring guidelines and medication adherence to achieve therapeutic efficacy Improve pharmacy services through collaboration with PharmD and provider.   Interventions: 1:1 collaboration with Binnie Rail, MD regarding development and update of comprehensive plan of care as evidenced by provider attestation and co-signature Inter-disciplinary care team collaboration (see longitudinal plan of care) Comprehensive medication review performed; medication  list updated in electronic medical record  Hypertension (BP goal <140/90) -Controlled - pt is not checking BP at home but BP in recent office visit has been at goal; she needs a new BP monitor -Current treatment: Amlodipine 5 mg daily AM Furosemide 20 mg daily AM Propranolol 80 mg BID  Telmisartan 80 mg daily AM -Current home readings: n/a -Denies hypotensive/hypertensive symptoms -Educated on BP goals and benefits of medications for prevention of heart attack, stroke and kidney damage; Proper BP monitoring technique; -Counseled to monitor BP at home periodically, document, and provide log at future appointments -Recommended to continue current medication  Hyperlipidemia: (LDL goal < 100) -Controlled - LDL is at goal; pt endorses compliance with statin -Current treatment: Atorvastatin 10 mg daily AM -Educated on Cholesterol goals; Benefits of statin for ASCVD risk reduction; -Recommended to continue current medication  Diabetes (A1c goal <7%) -Diet-controlled -Educated on A1c and blood sugar goals; -Counseled on diet and exercise extensively  Depression/Anxiety/ADHD (Goal: manage symptoms) -Not ideally controlled - pt follows with psychiatry, neuropsych and recently underwent neuropsychiatric testing strongly suggesting Lewy Body dementia -pt reports her psychiatrist recently retired and she is trying to get set up with a new one -Current treatment: Duloxetine 60 mg daily AM Modafinil 100 mg daily ([place]) -Medications previously tried/failed: Adderall, alprazolam, amitriptyline, escitalopram, sertraline, methylphenidate, venlafaxine -PHQ9: 16 (12/2020) -GAD7: 15 (02/2018) -Educated on Benefits of medication for symptom control -Recommended to continue current medication  Osteopenia (Goal prevent fractures) -Not ideally controlled - Pt is not taking vitamin D; she is also taking total calcium dose at once in AM; -Last DEXA Scan: 06/10/2018   T-Score femoral neck:  -1.1  T-Score total hip: -0.3  10-year probability of major osteoporotic fracture: 6.1%  10-year probability of hip fracture: 0.2% -Patient is not a candidate for pharmacologic treatment -Current treatment  Calcium carbonate 600 mg - 2 tab in AM -Recommend [phone] units of vitamin D daily. Recommend 1200 mg of calcium daily from dietary and supplemental sources. -Pt is due for repeat DEXA scan -Advised to separate calcium tablets to 1 AM and 1 PM due to absorption issues  IBS (Goal: maange symptoms) -Controlled - per patient report -Current treatment  Dicyclomine 10 mg - 2 AM, 1 PM Linzess 145 mcg daily AM Miralax AM Probiotic AM -Recommended to continue current medication  Pain (Goal: manage symptoms) -Controlled -Hx osteoarthritis, lumbar disc degeneration, lumbar radiculopathy,  -Current treatment  Tylenol  325 mg PRN - 4 at HS Tramadol 50 mg q6h PRN -Recommended to continue current medication  Iridocyclitis (Goal: manage symptoms/prevent progression) -Controlled - follows with Dr Manuella Ghazi -Current treatment  Methotrexate 2.5 mg Manuella Ghazi) - 4 once a week (Mondays) Folic acid 1 mg daily -Recommended to continue current medication  Health Maintenance -Vaccine gaps: PPSV23 -Current therapy:  Vitamin B6 Multivitamin -Patient is satisfied with current therapy and denies issues -Recommended to continue current medication  Patient Goals/Self-Care Activities Patient will:  - take medications as prescribed focus on medication adherence by pill packs check blood pressure periodically, document, and provide at future appointments collaborate with provider on medication access solutions -Utilize [facility] for medication synchronization, packaging and delivery    Patient Care Plan: [place] [facility]     Problem Identified: Chronic Disease Management Needs   Priority: Medium     Long-Range Goal: Development of plan of care for long term chronic disease  management   Start Date: 02/09/2021  Expected End Date: 02/09/2022  Priority: Medium  Note:   Current Barriers:  Chronic Disease Management support and education needs related to HTN, DMII, and cognitive decline/ impairment Patient lives alone- reports limited ability to care for self in setting of progressive cognitive decline with recent Lewy Body diagnosis Multiple community resource/ social work needs: Engineer, agricultural and CCM CSW active Medication management in setting of cognitive decline: CCM Pharmacy team active Lack of focus/ ability to convey thought patterns in clear manner  RNCM Clinical Goal(s):  Patient will continue to work with Consulting civil engineer to address care management and care coordination needs related to HTN, DMII, and cognitive decline  through collaboration with Consulting civil engineer, provider, and care team.   Interventions: 1:1 collaboration with primary care provider regarding development and update of comprehensive plan of care as evidenced by provider attestation and co-signature Inter-disciplinary care team collaboration (see longitudinal plan of care) Evaluation of current treatment plan related to  self management and patient's adherence to plan as established by provider  Diabetes:  (Status: New goal.) Lab Results  Component Value Date   HGBA1C 5.7 03/10/2019  Assessed patient's understanding of A1c goal:  patient unable to verbalize; verified no recent A1-C results (5.7 03/10/2019) Provided education to patient about basic DM disease process; Reviewed prescribed diet with patient carb modified, low sugar, heart healthy, low salt; Counseled on importance of regular laboratory monitoring as prescribed;        Discussed plans with patient for ongoing care management follow up and provided patient with direct contact information for care management team;      Review of patient status, including review of consultants reports, relevant laboratory and other  test results, and medications completed;       Discussed with patient blood sugar monitoring at home: she reports she obtained glucometer from CCM pharmacist- however, reports she is unable to see numbers on screen, states "they are too small, I can't read them;" requests that another/ different glucometer be provided/ prescribed for her- will message CCM Pharmacist as FYI Confirmed patient has not yet started monitoring blood sugars at home, as above Discussed significance of A1-C values: reviewed individual patient trends with her; discussed need for updated A1-C level; she reports does not have scheduled PCP office visit, but will consider scheduling in future  Hypertension: (Status: New goal.) Last practice recorded BP readings:  BP Readings from Last 3 Encounters:  02/02/21 106/60  01/13/21 136/76  01/03/21 (!) 142/76  Most recent eGFR/CrCl:  Lab Results  Component Value Date   EGFR 82 (L) 02/28/2017    No components found for: CRCL  Evaluation of current treatment plan related to hypertension self management and patient's adherence to plan as established by provider;   Discussed plans with patient for ongoing care management follow up and provided patient with direct contact information for care management team; Discussed complications of poorly controlled blood pressure such as heart disease, stroke, circulatory complications, vision complications, kidney impairment, sexual dysfunction;  Confirmed patient does not monitor blood pressures at home- reports takes medications Denies clinical concerns; reports multiple falls without serious injury over last few months- fall prevention discussed Reviewed upcoming provider appointments with patient: CCM CSW 02/24/21; neurology provider 04/21/21: patient states she is currently using transportation reosurces, previously provided by CCM care guide Confirmed patient is active with CCM CSW/ [employer] Care Guide: she is asking me about  follow up on her stated need for personal care services through medicaid; states she "thinks" she has medicaid, but "is not sure;" she shares that she has been talking to [place] guide team/ CSW about this matter, as well as need for Meals on Wheels-- patient eventually remembers that she was told "by someone" that she is on the waiting list Care Coordination Outreach to Eduard Clos, CCM CSW: updated Marcie Bal on my conversation with patient today and she confirms that she has placed another care guide referral re: possibility of personal care services resources/ in-home care assistance  Patient Goals/Self-Care Activities: Patient will self administer medications as prescribed Patient will attend all scheduled provider appointments Patient will call pharmacy for medication refills Patient will call provider office for new concerns or questions Patient will maintain communication with CCM team: CSW, care guide, pharmacy, and RN CM

## 2021-02-10 ENCOUNTER — Telehealth: Payer: Self-pay

## 2021-02-10 NOTE — Telephone Encounter (Signed)
   Telephone encounter was:  Unsuccessful.  02/10/2021 Name: Janet Le MRN: [id number] DOB: 1956-02-28  Unsuccessful outbound call made today to assist with:   dementia and in-home care resources.  Outreach Attempt:  1st Attempt  Attempted to call patient about resources for dementia and in-home care. Unable to leave message voicemail is full.  Sent letter to [employer] to be printed and mailed to patient. Letter is saved in [place], AAS Paralegal, [facility]  [address], [place] [address] ??[email or web address]  ?? [id number]   [email or web address]

## 2021-02-14 ENCOUNTER — Telehealth: Payer: Self-pay | Admitting: Internal Medicine

## 2021-02-14 ENCOUNTER — Telehealth: Payer: Self-pay

## 2021-02-14 DIAGNOSIS — G3184 Mild cognitive impairment, so stated: Secondary | ICD-10-CM

## 2021-02-14 DIAGNOSIS — R5381 Other malaise: Secondary | ICD-10-CM

## 2021-02-14 DIAGNOSIS — G3183 Dementia with Lewy bodies: Secondary | ICD-10-CM

## 2021-02-14 DIAGNOSIS — Z9114 Patient's other noncompliance with medication regimen: Secondary | ICD-10-CM

## 2021-02-14 DIAGNOSIS — F02A0 Dementia in other diseases classified elsewhere, mild, without behavioral disturbance, psychotic disturbance, mood disturbance, and anxiety: Secondary | ICD-10-CM

## 2021-02-14 NOTE — Telephone Encounter (Signed)
   Daughter Darrick Penna calling to request order for home health eval for patient. She would like referral sent to [facility] Please call [phone]

## 2021-02-14 NOTE — Telephone Encounter (Signed)
What services does she feel she needs - PT, nursing and HHA ( which will only come while she has nursing or PT)?

## 2021-02-14 NOTE — Telephone Encounter (Signed)
   Telephone encounter was:  Successful.  02/14/2021 Name: EMIA MIGUES MRN: [id number] DOB: 1956-03-26  MACIE GOODINE is a 65 y.o. year old female who is a primary care patient of Burns, Claudina Lick, MD . The community resource team was consulted for assistance with Healthcare Power of Attorney, Advanced Directives, Grief Counseling, Dementia care resources and In-Home Care list.  Care guide performed the following interventions: Spoke with patient's daughter Uldine Duffin she asked if I could email the information for [place], Advanced Directives, Grief Counseling, Dementia care resources and In-Home Care list.  Follow Up Plan:  Care guide will follow up with patient by phone over the next 7 days.  Evyn Putzier, AAS Paralegal, [facility]  [address], [place] [address] ??[email or web address]  ?? [id number]   [email or web address]

## 2021-02-21 ENCOUNTER — Telehealth: Payer: Self-pay

## 2021-02-21 NOTE — Telephone Encounter (Signed)
   Telephone encounter was:  Successful.  02/21/2021 Name: DELOIS GRAVATT MRN: [id number] DOB: 03-Feb-1956  KHANIYAH CANUTO is a 65 y.o. year old female who is a primary care patient of Burns, Claudina Lick, MD . The community resource team was consulted for assistance with  healthcare power of attorney, dementia, in-home care and caregiver support.   Care guide performed the following interventions: Spoke with patient's daughter Nasiyah Welshans. She has received the information for Healthcare Power of Attorney, Caregiver support, dementia and in-home care emailed to her on 02/14/21.    Follow Up Plan:  No further follow up planned at this time. The patient has been provided with needed resources.  Wylee Dorantes, AAS Paralegal, [facility]  [address], [place] [address] ??[email or web address]  ?? [id number]   [email or web address]

## 2021-02-22 DIAGNOSIS — F419 Anxiety disorder, unspecified: Secondary | ICD-10-CM | POA: Diagnosis not present

## 2021-02-22 DIAGNOSIS — F32A Depression, unspecified: Secondary | ICD-10-CM | POA: Diagnosis not present

## 2021-02-22 DIAGNOSIS — M8949 Other hypertrophic osteoarthropathy, multiple sites: Secondary | ICD-10-CM

## 2021-02-22 DIAGNOSIS — E7800 Pure hypercholesterolemia, unspecified: Secondary | ICD-10-CM | POA: Diagnosis not present

## 2021-02-22 DIAGNOSIS — I10 Essential (primary) hypertension: Secondary | ICD-10-CM

## 2021-02-23 ENCOUNTER — Telehealth: Payer: Self-pay | Admitting: *Deleted

## 2021-02-23 NOTE — Progress Notes (Signed)
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  If tolerating, will increase to 366mtwice daily in 4 weeks, Advised to continue daily activities (medication management, finances, chores) but should have somebody monitor. Recommended seeking to establish POA.   Recommended to have her friend and/or family members accompany her to doctor's appointments (particularly her follow up appointment with me) so that they are aware of her diagnosis and needed management. Continue to refrain from driving Follow up with home health assessment. Follow up with me in 6 months.  Plan to repeat neuropsychological testing in one year.   Subjective:  Janet Le a [age]ear old right-handed woman with hypertension, pre-diabetes, TB, IBS, lymphedema, osteoarthritis and osteoporosis and history of renal cell carcinoma who follows up for neuropathy and confusion.  She is accompanied by her friend, Janet Losewho supplements history.   UPDATE: Current medications:  duloxetine 6076mpropranolol 80m38mD, amlodipine, Lasix, tramadol  Continued workup for neuropathy.  Labs from 11/02/2020 include negative ANA, sed rate 16, ACE 23, SPEP/IFE negative for M-spike.  NCV-EMG on 11/22/2020 now demonstrated chronic sensorimotor axonal polyneuropathy.   She underwent neuropsychological evaluation on 01/25/2021.  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  Given her history of gait instability, hallucinations, and tremor, Lewy body dementia suspected.    She may quickly think she sees something (like a figure) and when she turns her head to that direction, it is gone.  It does not happen all of the time.  She has not been driving.  Uses [employer].  Lives alone but she has a close friend, Janet Le often  checks on her.  Children also live by.  She is supposed to have home health come over for assessment.  She says she still pays bills but thinks she needs help.  She is having her medications set up in a pack from the pharmacy.    HISTORY: I.  COGNITIVE DEFICITS/VISUAL DISTURBANCE/TREMORS: In 2014, she started experiencing visual disturbance.  She describes initially seeing a line of light along the bottom of her visual field in the left eye.  She then developed what looks like bubbles floating up in the temporal aspect of the visual fields of in both eyes.  It lasts only a few seconds and occurs several times a day.  She also began experiencing dizziness and lightheadedness and was found to have orthostatic hypotension.  In 2018, she began noticing memory problems.  Labs from May 2019 included normal B12 528 and TSH 1.37.  She has been treated for latent TB.   Around this time, she began experiencing tremors in the hands and occasional jerks of her extremities.   She underwent neuropsychological testing 07/09/18.  She demonstrated probable deficits of processing speed, executive function, visual memory significantly more than verbal memory, and visuEcologistt was noted that such a profile may be seen in early Parkinson's disease or a Parkinson's plus syndrome.  However, her performance may have been skewed due to observed anxiety and mildly suboptimal task persistence.  In March 2022, she was confused and hallucinating.  She was looking out of the house at her car and looked like there was somebody in her car.  When she went out to her car, nobody was there.  She started seeing people, strangers, not there in her house. She  said that they were coming in through the walls.  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  II.  NEUROPATHY:  In 2016, she started experiencing burning pain from the bottom of her feet up the back of her legs to the knees.  It is intermittent and  lasts for various and unknown period of time, but not aware of any triggers such as due to position.  In addition, she is reporting increased back pain and spasms, worse when sitting and improved when laying supine.  She has a history of chronic low back pain with left lumbar radiculopathy, as well as right knee pain.  She has been seen and evaluated by orthopedics and pain management.  MRI of lumbar spine from 07/27/16 revealed advanced L4-5 and L5-S1 facet arthrosis with some bilateral neural foraminal stenosis.  Dr. Dean of orthopedics did not think surgery was an option at that time.  TSH from 10/15/16 was 1.67.  B12 from 10/18/16 was 537.  Hgb A1c from 04/05/17 was 5.6.  NCV-EMG from 10/25/16 demonstrated no evidence of large fiber sensorimotor polyneuropathy or lumbosacral radiculopathy.  MRI of brain and cervical spine on 10/05/2020 which showed stable 11 mm left frontal convexity meningioma, minimal chronic small vessel disease, moderate cervical spondylosis from C4 through C7 and small central disc protrusion at C7-T1 but no acute intracranial abnormality or spinal stenosis or cord abnormality.      PAST MEDICAL HISTORY: Past Medical History:  Diagnosis Date   Allergic rhinitis 08/31/2009   Arthralgia of hip 11/14/2017   Attention deficit hyperactivity disorder 06/06/2020   diagnosed as adult w/o formal testing   Bilateral leg edema 06/12/2018   Blepharitis 11/07/2019   Blood dyscrasia    platelets low in past   Cataract    Cervical radiculopathy 09/07/2020   Cervicalgia 05/30/2016   Chronic low back pain 07/18/2016   Degeneration of lumbar intervertebral disc 01/09/2018   Dyspnea on exertion 05/11/2019   Endometriosis    Esophageal stricture    Essential hypertension 05/06/2009   Excessive daytime sleepiness 05/11/2019   External hemorrhoids 08/28/2010   Fatigue 11/14/2017   Generalized anxiety disorder 10/08/2017   GERD (gastroesophageal reflux disease)    Glaucoma 06/06/2020   Gout  12/11/2013   Hiatal hernia    Hypertensive retinopathy of both eyes 10/06/2019   IBS (irritable bowel syndrome) with chronic constipation 11/14/2010   Iridocyclitis 10/06/2019   Laryngopharyngeal reflux (LPR) 07/03/2018   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  No significant change from 2017  dexa 10/2015: t score  Spine -1.3, dual femur -0.9  Took alendronate 2014-2019   Post traumatic stress disorder (PTSD)    Primary osteoarthritis involving multiple joints 03/10/2019   Bilateral hips, knees   Pseudophakia of both eyes 10/06/2019   Pure hypercholesterolemia 03/16/2010   Renal cell carcinoma    R cryoablation by IR 02/16/11, Dr. Yamagata  Followed by Dr. Dahlkstedt  No evidence of residual disease on CT 12/13 and 12/14   Rib pain on left side 01/03/2021   S/P   knee surgery 10/30/2016   Spondylolisthesis of lumbar region 01/09/2018   Syncope and collapse 05/16/2009   since childhood; associated with extreme heat   TB lung, latent 11/25/2019   Thrombocytopenia 05/30/2015   Saw hem 02/2017 - mild, chronic - advised to stop protonix, no concerning cause, plan for pcp to monitor   Tinnitus of right ear 07/03/2018   Type 2 diabetes mellitus without complication, without long-term current use of insulin 10/06/2019   Visual hallucinations    Vulvar itching 01/03/2021    MEDICATIONS: Current Outpatient Medications on File Prior to Visit  Medication Sig Dispense Refill   Accu-Chek FastClix Lancets MISC USE AS DIRECTED UP  TO  1 TIME  DAILY- DX CODE R73.03 100  each 1   acetaminophen (TYLENOL) 325 MG tablet Take 1,300 mg by mouth at bedtime.     amLODipine (NORVASC) 5 MG tablet Take 1 tablet by mouth once daily 90 tablet 0   atorvastatin (LIPITOR) 10 MG tablet Take 1 tablet by mouth once daily 90 tablet 0   Blood Glucose Monitoring Suppl (ACCU-CHEK GUIDE ME) w/Device KIT Use to check blood sugar daily 1 kit 0   calcium carbonate (OS-CAL) 600 MG TABS tablet Take 600 mg by mouth 2 (two) times daily with a meal.     clotrimazole (LOTRIMIN) 1 % cream SMARTSIG:1 Topical Daily PRN     dicyclomine (BENTYL) 10 MG capsule TAKE 1 TO 2 CAPSULES BY MOUTH THREE TIMES DAILY AS NEEDED. 150 capsule 3   DULoxetine (CYMBALTA) 60 MG capsule Take 1 capsule (60 mg total) by mouth daily. 90 capsule 0   folic acid (FOLVITE) 1 MG tablet Take 1 mg by mouth daily.     furosemide (LASIX) 20 MG tablet Take 1 tablet by mouth once daily 90 tablet 0   glucose blood (ACCU-CHEK GUIDE) test strip Use to check sugars once daily. Dx Code-R73.03 100 each 1   linaclotide (LINZESS) 145 MCG CAPS capsule Take 1 capsule (145 mcg total) by mouth daily. 90 capsule 3   methotrexate 2.5 MG tablet Take 10 mg by mouth once a week. Mondays     Misc Natural Products (HEALTHY LIVER PO) Take by mouth.     modafinil (PROVIGIL) 100 MG tablet Take 1 tablet (100 mg total) by mouth daily. 30 tablet 0   Multiple Vitamin (MULTIVITAMIN) tablet Take 1 tablet by mouth daily.     polyethylene glycol powder (GLYCOLAX/MIRALAX) 17 GM/SCOOP powder Take 1 Container by mouth once.     Probiotic Product (PROBIOTIC-10 PO) Take 6 Billion Cells by mouth.     propranolol (INDERAL) 80 MG tablet Take 1 tablet by mouth twice daily 180 tablet 0   pyridOXINE (VITAMIN B-6) 50 MG tablet Take 1 tablet (50 mg total) by mouth daily. 30 tablet 8   telmisartan (MICARDIS) 80 MG tablet Take 1 tablet by mouth once daily 90 tablet 0   traMADol (ULTRAM) 50 MG tablet Take 1 tablet (50 mg total) by mouth every 6 (six) hours as needed. 30  tablet 0   No current facility-administered medications on file prior to visit.    ALLERGIES: Allergies  Allergen Reactions   Sertraline Hcl Other (See Comments)    Spasms; numbness   Ace Inhibitors Cough   Codeine Palpitations   Darifenacin Hydrobromide Er Other (See Comments)    Pt states made her feel queezy & drunk   Gabapentin Other (See Comments)    Hallucinations   Pravastatin Other (See Comments)    myalgias     Propoxyphene Hcl Palpitations   Wellbutrin [Bupropion Hcl] Other (See Comments)    Numbness of mouth/lips    FAMILY HISTORY: Family History  Problem Relation Age of Onset   Diabetes Mother    Hypertension Mother    Hyperlipidemia Mother    Heart disease Mother    Stroke Mother    Kidney disease Mother    Thyroid disease Mother    Sleep apnea Mother    Obesity Mother    Memory loss Mother    Kidney disease Father    Prostate cancer Father    Alcoholism Father    Alcohol abuse Father    Multiple sclerosis Sister    Colitis Brother    Alcohol abuse Brother    Leukemia Brother    Alcohol abuse Daughter    Breast cancer Maternal Aunt    Breast cancer Cousin    Colon polyps Other    Colon cancer Neg Hx    Esophageal cancer Neg Hx    Pancreatic cancer Neg Hx    Rectal cancer Neg Hx    Stomach cancer Neg Hx       Objective:  Blood pressure 115/70, pulse 71, height 5' 2" (1.575 m), weight 228 lb (103.4 kg), SpO2 99 %. General: No acute distress.  Patient appears well-groomed.      , DO  CC: Stacy Burns, MD       

## 2021-02-23 NOTE — Telephone Encounter (Signed)
   Telephone encounter was:  Successful.  02/23/2021 Name: Janet Le MRN: [id number] DOB: May 06, 1956  Janet Le is a 65 y.o. year old female who is a primary care patient of Burns, Claudina Lick, MD . The community resource team was consulted for assistance with Patient reached out to express that she was happy that she is getting home health through the [facility] insurance a process that we had started on her previous referal , she also expressed diemay and loss , confusion at a new diagnosis   Care guide performed the following interventions: Patient provided with information about care guide support team and interviewed to confirm resource needs.  Follow Up Plan:  No further follow up planned at this time. The patient has been provided with needed resources. [place], Care Management  [phone] [address] , [place] [address] Email : [email or web address]

## 2021-02-24 ENCOUNTER — Telehealth: Payer: Self-pay | Admitting: *Deleted

## 2021-02-24 ENCOUNTER — Ambulatory Visit (INDEPENDENT_AMBULATORY_CARE_PROVIDER_SITE_OTHER): Payer: 59 | Admitting: Neurology

## 2021-02-24 ENCOUNTER — Telehealth: Payer: Medicare (Managed Care)

## 2021-02-24 ENCOUNTER — Other Ambulatory Visit: Payer: Self-pay

## 2021-02-24 ENCOUNTER — Encounter: Payer: Self-pay | Admitting: Neurology

## 2021-02-24 VITALS — BP 115/70 | HR 71 | Ht 62.0 in | Wt 228.0 lb

## 2021-02-24 DIAGNOSIS — G3183 Dementia with Lewy bodies: Secondary | ICD-10-CM | POA: Diagnosis not present

## 2021-02-24 DIAGNOSIS — G609 Hereditary and idiopathic neuropathy, unspecified: Secondary | ICD-10-CM | POA: Diagnosis not present

## 2021-02-24 DIAGNOSIS — G3184 Mild cognitive impairment, so stated: Secondary | ICD-10-CM | POA: Diagnosis not present

## 2021-02-24 MED ORDER — RIVASTIGMINE TARTRATE 1.5 MG PO CAPS: 1.5000 mg | ORAL_CAPSULE | Freq: Two times a day (BID) | ORAL | 0 refills | Status: DC

## 2021-02-24 NOTE — Patient Instructions (Addendum)
Start rivastigmine 1.'5mg'$  twice daily.  In one month contact me with update and I will increase dose. Call back about the home health assessment Have somebody monitor you when you pay bills, complete chores Find out about seeing a lawyer to set up power of attorney. For assistance with senior care, elder law, and estate planning (POA, medical directives):  [employer]  [address], [place]  [address]  Tel: [phone]  [email or web address]   Berneice Heinrich  Tel: [phone]  [email or web address]   5.  RESOURCES: Senior Resources of [facility]:  [phone]  Tel [place]:   [phone]  [email or web address].cfm    Resources for common questions found under "Pathways & Protocols "  [email or web address]-resources-[email or web address]/Pathways_Menu.htm  6. I would refrain from driving 7.  Follow up in 6 months.  Lewy Body Dementia Lewy body dementia, also called dementia with Lewy bodies, is a condition that affects the way the brain functions. It is one form of dementia. In this condition, proteins called Lewy bodies build up in certain areas of the brain. This causes problems with: Memory. Decision making. Behavior. Speaking. Thinking and problem solving. Movements and balance. This condition is progressive, which means that it gets worse with time (is degenerative) and cannot be reversed. What are the causes? This condition is caused by the buildup of Lewy bodies in brain cells in areas of the brain that control memory, thinking, and movement. It is not known what causes the Lewy bodies to build up. What increases the risk? You are more likely to develop this condition if you: Have a family history of Lewy body dementia or Parkinson's disease. Are 65 years old or older. Are female. What are the signs or symptoms? Symptoms of this condition may include: Seeing things that are not there (hallucinating). Sleep  problems, such as acting out dreams while you are asleep. Changes in memory, attention, and concentration that come and go (fluctuate). Symptoms of dementia, such as: Trouble with memory. Trouble paying attention. Problems with planning and organizing. Problems with judgment. Behavioral problems. Symptoms of Parkinson's disease, such as: Shaking movements that you cannot control (tremor). Tremors usually start in a hand or foot when you are resting (resting tremor). Stooped posture. Slowing of movement. Stiff muscles (rigidity). Loss of balance and stability when standing. How is this diagnosed? This condition is diagnosed by a specialist who diagnoses and treats this condition (neurologist). Your health care provider will talk with you and your family, friends, or caregivers about your history and symptoms. A thorough medical history will be taken, and you will have a physical exam and tests. Tests may include: Lab tests, such as blood or urine tests. Imaging tests, such as a CT scan, a PET scan, or an MRI. A test that involves removing and testing a small amount of the fluid that surrounds the brain and spinal cord (lumbar puncture). Tests that evaluate brain function, such as memory tests, cognitive tests, and neuropsychological tests. How is this treated? There is no cure for this condition. Treatment focuses on managing your symptoms. Treatment may include: Medicines to help with hallucinations, sleep, and behavioral problems. Speech, occupational, and physical therapy. Your health care provider can help direct you to support groups, organizations, and other health care providers who can help with decisions about your care. Follow these instructions at home: Medicines Take over-the-counter and prescription medicines only as told by your health care provider. To help you manage your medicines, use a pill  organizer or pill reminder. Avoid taking medicines that can affect thinking,  such as pain medicines or certain sleeping medicines. Always check with your health care provider before taking any new medicines. Lifestyle Make healthy lifestyle choices: Be physically active as told by your health care provider. Do not use any products that contain nicotine or tobacco, such as cigarettes, e-cigarettes, and chewing tobacco. If you need help quitting, ask your health care provider. Try to practice stress-management techniques when you experience stress, such as mindfulness, yoga, or deep breathing. Stay socially connected. Talk regularly with other people, such as family, friends, and neighbors. Make sure you sleep well. These tips can help you get a good night's rest: Avoid napping during the day. Keep your sleeping area dark and cool. Avoid exercising a few hours before you go to bed. Avoid caffeine products in the evening. Eating and drinking Do not drink alcohol. Drink enough fluid to keep your urine pale yellow. Eat a healthy diet. Safety  Work with your health care provider to determine what you need help with and what your safety needs are. If you have trouble moving around, use a cane or walker as told by your health care provider. Make sure your home environment is safe. To do this: Remove things that can be a tripping hazard, such as throw rugs or clutter. Install grab bars and railings in your home to prevent falls. Talk with your health care provider about whether it is safe for you to drive. If you were given a bracelet that identifies you as a person with memory loss or tracks your location, make sure to wear it at all times. General instructions  Work with your family to make important decisions, such as advance directives, medical power of attorney, or a living will. Keep all follow-up visits. This is important. Where to find more information [employer] of Neurological Disorders and Stroke: MasterBoxes.it Contact a health care provider if  you have: New or worsening confusion. New or worsening trouble with sleeping or increased daytime sleepiness. Problems with choking or swallowing. Get help right away if: You feel depressed or sad, or feel that you want to harm yourself. Your family members become concerned for your safety. If you ever feel like you may hurt yourself or others, or have thoughts about taking your own life, get help right away. Go to your nearest emergency department or: Call your local emergency services (911 in the [place] Call a suicide crisis helpline, such as the [place] at (212)507-2795. This is open 24 hours a day in the [place] Text the Crisis Text Line at 7126167090 (in the [place].). Summary Lewy body dementia is a condition that affects the way the brain functions. It causes problems with functions such as memory and thinking. Lewy body dementia gets worse with time. This condition is caused by the buildup of proteins called Lewy bodies in brain cells. It is not known what causes the Lewy bodies to build up. Work with your health care provider to determine what you need help with and what your safety needs are. Your health care provider can help direct you to support groups, organizations, and other health care providers who can help with decisions about your care. This information is not intended to replace advice given to you by your health care provider. Make sure you discuss any questions you have with your health care provider. Document Revised: 10/26/2019 Document Reviewed: 10/26/2019 Elsevier Patient Education  [place].

## 2021-02-24 NOTE — Telephone Encounter (Signed)
  Care Management   Follow Up Note   02/24/2021 Name: JELENE MCCABE MRN: [id number] DOB: 11-19-1955   Referred by: Binnie Rail, MD Reason for referral : No chief complaint on file.   An unsuccessful telephone outreach was attempted today. The patient was referred to the case management team for assistance with care management and care coordination.   Follow Up Plan: Telephone follow up appointment with care management team member scheduled for:10-14 days  Eduard Clos MSW, LCSW Licensed Clinical Social Worker [place] [phone]

## 2021-02-28 NOTE — Telephone Encounter (Signed)
Type Date User Summary Attachment  General 02/23/2021  4:24 PM [place], Mayford Knife - -  Note   Community message sent to [place], [facility]

## 2021-02-28 NOTE — Telephone Encounter (Signed)
Please advise as the pts daughter has called in stating that she has been calling since 02/14/2021 to have orders for PT and HHA sent into [facility] in [place].   **Pts daughter has stated if nursing needs to ne added for mother to receive services she is open to that as well.  Orders can be sent to Mickel Baas at [facility] and can be also reached at for any further questions or concerns pertaining to the orders for services needed. Tel: [phone] and the  Fax: [phone]  **Please call pts daughter at [phone].

## 2021-03-02 NOTE — Telephone Encounter (Signed)
Spoke with Enid Derry today, patient's daughter.  Spoke with Mickel Baas today from [facility] and she said someone was coming out today.  Daughter has been informed.

## 2021-03-02 NOTE — Telephone Encounter (Signed)
Tried to reach patient but VM is full and unable to leave message.

## 2021-03-06 DIAGNOSIS — M1990 Unspecified osteoarthritis, unspecified site: Secondary | ICD-10-CM

## 2021-03-06 DIAGNOSIS — R339 Retention of urine, unspecified: Secondary | ICD-10-CM | POA: Diagnosis not present

## 2021-03-06 DIAGNOSIS — N10 Acute tubulo-interstitial nephritis: Secondary | ICD-10-CM | POA: Diagnosis not present

## 2021-03-06 DIAGNOSIS — E119 Type 2 diabetes mellitus without complications: Secondary | ICD-10-CM | POA: Diagnosis not present

## 2021-03-06 DIAGNOSIS — Z7984 Long term (current) use of oral hypoglycemic drugs: Secondary | ICD-10-CM

## 2021-03-06 DIAGNOSIS — I480 Paroxysmal atrial fibrillation: Secondary | ICD-10-CM

## 2021-03-06 DIAGNOSIS — M4800 Spinal stenosis, site unspecified: Secondary | ICD-10-CM

## 2021-03-06 DIAGNOSIS — Z8673 Personal history of transient ischemic attack (TIA), and cerebral infarction without residual deficits: Secondary | ICD-10-CM

## 2021-03-06 DIAGNOSIS — E785 Hyperlipidemia, unspecified: Secondary | ICD-10-CM

## 2021-03-06 DIAGNOSIS — I350 Nonrheumatic aortic (valve) stenosis: Secondary | ICD-10-CM | POA: Diagnosis not present

## 2021-03-06 DIAGNOSIS — G609 Hereditary and idiopathic neuropathy, unspecified: Secondary | ICD-10-CM

## 2021-03-06 DIAGNOSIS — I10 Essential (primary) hypertension: Secondary | ICD-10-CM

## 2021-03-06 DIAGNOSIS — M353 Polymyalgia rheumatica: Secondary | ICD-10-CM

## 2021-03-08 ENCOUNTER — Telehealth: Payer: Medicare (Managed Care)

## 2021-03-09 ENCOUNTER — Telehealth: Payer: Self-pay | Admitting: *Deleted

## 2021-03-09 NOTE — Telephone Encounter (Signed)
  Care Management   Follow Up Note   03/09/2021 Name: Janet Le MRN: [id number] DOB: 02-Apr-1956   Referred by: Binnie Rail, MD Reason for referral : No chief complaint on file.   A second unsuccessful telephone outreach was attempted today. The patient was referred to the case management team for assistance with care management and care coordination.   Follow Up Plan: The care management team will reach out to the patient again over the next 10 days.   Eduard Clos MSW, LCSW Licensed Clinical Social Worker [facility] [place] [phone]

## 2021-03-16 ENCOUNTER — Telehealth: Payer: Self-pay | Admitting: Internal Medicine

## 2021-03-16 NOTE — Telephone Encounter (Signed)
   Patient requesting completion of handicap placard. She states the one she was given during last visit did not have signature, not completed   Please call

## 2021-03-16 NOTE — Telephone Encounter (Signed)
Spoke with patient today. 

## 2021-03-17 NOTE — Telephone Encounter (Signed)
Handicap sticker placed in out going mail today.

## 2021-03-20 ENCOUNTER — Ambulatory Visit (INDEPENDENT_AMBULATORY_CARE_PROVIDER_SITE_OTHER): Payer: Commercial Managed Care - HMO | Admitting: *Deleted

## 2021-03-20 DIAGNOSIS — R7303 Prediabetes: Secondary | ICD-10-CM

## 2021-03-20 DIAGNOSIS — I10 Essential (primary) hypertension: Secondary | ICD-10-CM

## 2021-03-20 DIAGNOSIS — R4189 Other symptoms and signs involving cognitive functions and awareness: Secondary | ICD-10-CM

## 2021-03-20 NOTE — Chronic Care Management (AMB) (Signed)
Chronic Care Management   CCM RN Visit Note  03/20/2021 Name: Janet Le MRN: [id number] DOB: 10/02/1955  Subjective: Janet Le is a 65 y.o. year old female who is a primary care patient of Burns, Claudina Lick, MD. The care management team was consulted for assistance with disease management and care coordination needs.    Engaged with patient by telephone for follow up visit in response to provider referral for case management and/or care coordination services.   Consent to Services:  The patient was given information about Chronic Care Management services, agreed to services, and gave verbal consent prior to initiation of services.  Please see initial visit note for detailed documentation.  Patient agreed to services and verbal consent obtained.   Assessment: Review of patient past medical history, allergies, medications, health status, including review of consultants reports, laboratory and other test data, was performed as part of comprehensive evaluation and provision of chronic care management services.   CCM Care Plan Allergies  Allergen Reactions   Sertraline Hcl Other (See Comments)    Spasms; numbness   Ace Inhibitors Cough   Codeine Palpitations   Darifenacin Hydrobromide Er Other (See Comments)    Pt states made her feel queezy & drunk   Gabapentin Other (See Comments)    Hallucinations   Pravastatin Other (See Comments)    myalgias   Propoxyphene Hcl Palpitations   Wellbutrin [Bupropion Hcl] Other (See Comments)    Numbness of mouth/lips   Outpatient Encounter Medications as of 03/20/2021  Medication Sig   Accu-Chek FastClix Lancets MISC USE AS DIRECTED UP  TO  1 TIME  DAILY- DX CODE R73.03   acetaminophen (TYLENOL) 325 MG tablet Take 1,300 mg by mouth at bedtime.   amLODipine (NORVASC) 5 MG tablet Take 1 tablet by mouth once daily   atorvastatin (LIPITOR) 10 MG tablet Take 1 tablet by mouth once daily   Blood Glucose Monitoring Suppl (ACCU-CHEK GUIDE ME)  w/Device KIT Use to check blood sugar daily   calcium carbonate (OS-CAL) 600 MG TABS tablet Take 600 mg by mouth 2 (two) times daily with a meal.   clotrimazole (LOTRIMIN) 1 % cream SMARTSIG:1 Topical Daily PRN   dicyclomine (BENTYL) 10 MG capsule TAKE 1 TO 2 CAPSULES BY MOUTH THREE TIMES DAILY AS NEEDED.   DULoxetine (CYMBALTA) 60 MG capsule Take 1 capsule (60 mg total) by mouth daily.   folic acid (FOLVITE) 1 MG tablet Take 1 mg by mouth daily.   furosemide (LASIX) 20 MG tablet Take 1 tablet by mouth once daily   glucose blood (ACCU-CHEK GUIDE) test strip Use to check sugars once daily. Dx Code-R73.03   linaclotide (LINZESS) 145 MCG CAPS capsule Take 1 capsule (145 mcg total) by mouth daily.   methotrexate 2.5 MG tablet Take 10 mg by mouth once a week. Mondays   Misc Natural Products (HEALTHY LIVER PO) Take by mouth.   modafinil (PROVIGIL) 100 MG tablet Take 1 tablet (100 mg total) by mouth daily.   Multiple Vitamin (MULTIVITAMIN) tablet Take 1 tablet by mouth daily.   polyethylene glycol powder (GLYCOLAX/MIRALAX) 17 GM/SCOOP powder Take 1 Container by mouth once.   Probiotic Product (PROBIOTIC-10 PO) Take 6 Billion Cells by mouth.   propranolol (INDERAL) 80 MG tablet Take 1 tablet by mouth twice daily   pyridOXINE (VITAMIN B-6) 50 MG tablet Take 1 tablet (50 mg total) by mouth daily.   rivastigmine (EXELON) 1.5 MG capsule Take 1 capsule (1.5 mg total) by mouth 2 (two)  times daily.   telmisartan (MICARDIS) 80 MG tablet Take 1 tablet by mouth once daily   traMADol (ULTRAM) 50 MG tablet Take 1 tablet (50 mg total) by mouth every 6 (six) hours as needed.   No facility-administered encounter medications on file as of 03/20/2021.   Patient Active Problem List   Diagnosis Date Noted   Mild neurocognitive disorder with Lewy bodies 01/25/2021   Rib pain on left side 01/03/2021   Vulvar itching 01/03/2021   Cervical radiculopathy 09/07/2020   Visual hallucinations    Attention deficit  hyperactivity disorder 06/06/2020   Glaucoma 06/06/2020   Blepharitis 11/07/2019   Type 2 diabetes mellitus without complication, without long-term current use of insulin 10/06/2019   Hypertensive retinopathy of both eyes 10/06/2019   Iridocyclitis 10/06/2019   Lattice degeneration of both retinas 10/06/2019   Pseudophakia of both eyes 10/06/2019   Nocturnal hypoxemia 05/11/2019   Dyspnea on exertion 05/11/2019   Excessive daytime sleepiness 05/11/2019   Primary osteoarthritis involving multiple joints 03/10/2019   Post traumatic stress disorder (PTSD)    Laryngopharyngeal reflux (LPR) 07/03/2018   Tinnitus of right ear 07/03/2018   Bilateral leg edema 06/12/2018   Degeneration of lumbar intervertebral disc 01/09/2018   Spondylolisthesis of lumbar region 01/09/2018   Fatigue 11/14/2017   Arthralgia of hip 11/14/2017   Major depressive disorder 10/08/2017   Generalized anxiety disorder 10/08/2017   Neuropathy 10/07/2017   LVH (left ventricular hypertrophy), mild 05/18/2017   S/P knee surgery 10/30/2016   Chronic low back pain 07/18/2016   Cervicalgia 05/30/2016   Neck pain 05/30/2016   Osteopenia 11/18/2015   Thrombocytopenia 05/30/2015   Pain in left knee 09/08/2014   Gout 12/11/2013   Morbid obesity 11/06/2013   Left lumbar radiculopathy 04/13/2012   Renal cell carcinoma (HCC)    IBS (irritable bowel syndrome) with chronic constipation 11/14/2010   GERD (gastroesophageal reflux disease) 11/14/2010   Pure hypercholesterolemia 03/16/2010   Lymphedema 03/15/2010   Allergic rhinitis 08/31/2009   Syncope and collapse 05/16/2009   Essential hypertension 05/06/2009   Conditions to be addressed/monitored:  HTN and DMII  Care Plan : RN Care Manager Plan of Care  Updates made by Knox Royalty, RN since 03/20/2021 12:00 AM     Problem: Chronic Disease Management Needs   Priority: Medium     Long-Range Goal: Development of plan of care for long term chronic disease  management   Start Date: 02/09/2021  Expected End Date: 02/09/2022  Priority: Medium  Note:   Current Barriers:  Chronic Disease Management support and education needs related to HTN, DMII, and cognitive decline/ impairment Patient lives alone- reports limited ability to care for self in setting of progressive cognitive decline with recent Lewy Body diagnosis Multiple community resource/ social work needs:  CCM CSW active Medication management in setting of cognitive decline: CCM Pharmacy team active Lack of focus/ ability to convey thought patterns in clear manner  RNCM Clinical Goal(s):  Patient will continue to work with Consulting civil engineer to address care management and care coordination needs related to HTN, DMII, and cognitive decline  through collaboration with Consulting civil engineer, provider, and care team.   Interventions: 1:1 collaboration with primary care provider regarding development and update of comprehensive plan of care as evidenced by provider attestation and co-signature Inter-disciplinary care team collaboration (see longitudinal plan of care) Evaluation of current treatment plan related to  self management and patient's adherence to plan as established by provider  Diabetes:  (Status: Goal  on track: YES.) Lab Results  Component Value Date   HGBA1C 5.7 03/10/2019  Assessed patient's understanding of A1c goal:  patient remains unable to verbalize; verified no recent A1-C results (5.7-- 03/10/2019) Provided education to patient about basic DM disease process; Reviewed prescribed diet with patient carb modified, low sugar, heart healthy, low salt; Counseled on importance of regular laboratory monitoring as prescribed;        Discussed plans with patient for ongoing care management follow up and provided patient with direct contact information for care management team;      Review of patient status, including review of consultants reports, relevant laboratory and other test results,  and medications completed;       Discussed with patient blood sugar monitoring at home: she previously reported she obtained glucometer from CCM pharmacist- but was unable to see numbers on screen, states Mendel Ryder has since told her that she can not order a new glucometer because patient had already opened package.  Today, she tells me that she has not been using her glucometer due to not being able to see screen and also reports "needs new batteries;" encouraged her to obtain new batteries and begin using, at least once daily for fasting blood sugars Confirmed patient has not yet started monitoring blood sugars at home, as above Discussed with patient newly ordered home health services: she confirms home health team visiting at home, but she is unable to share specific details around same: states CSW and PT visited but is not sure if they are coming back; states home health team was to reach out to Dr. Quay Burow about compression stockings she needs for bilateral LE's and "swelling arm;" she cannot tell me how long this swelling has been going on, just states that she needs Dr. Quay Burow to order stockings from "[employer]" so that her insurance company will cover the cost- I will message Dr. Quay Burow of patient's request; encouraged patient to consider making PCP appointment if this is a new issue; states she will do Confirms that she has not yet heard from [facility], still on waiting list per her report Confirms not following any specific diet: states her daughter occasionally brings her food and states today that she is also preparing her own meals: we discussed the value of following heart healthy, low salt, low carb/ low sugar diet  Hypertension: (Status: New goal.) Last practice recorded BP readings:  BP Readings from Last 3 Encounters:  02/02/21 106/60  01/13/21 136/76  01/03/21 (!) 142/76  Most recent eGFR/CrCl:  Lab Results  Component Value Date   EGFR 82 (L) 02/28/2017    No components  found for: CRCL  Evaluation of current treatment plan related to hypertension self management and patient's adherence to plan as established by provider;   Discussed plans with patient for ongoing care management follow up and provided patient with direct contact information for care management team; Discussed complications of poorly controlled blood pressure such as heart disease, stroke, circulatory complications, vision complications, kidney impairment, sexual dysfunction;  Confirmed patient does not monitor blood pressures at home- reports takes medications as prescribed Denies clinical concerns today and sounds to be in no distress, however, she continues to lack focus; states her daughter helps her with managing her health care needs; today, she denies new/ recent falls, however, she reported multiple falls without serious injury over last few months- previously provided education around fall prevention reiterated; this visit patient reports consistently using cane Reviewed recent neurological office visit  with patient 02/24/21: she is unable to definitively tell me if she is taking recently prescribed rivastigmine, but she denies medication concerns and tells me she has all of her medications and is taking as prescribed Reviewed diet as above No upcoming provider appointments noted Made patient aware that CCM CSW has been trying to contact her by phone for follow up; patient does not remember CCM CSW, and thinks I am talking about home health CSW- will copy chart to CCM CSW as FYI Patient confirms she has not been driving as recommended by neurology provider  Patient Goals/Self-Care Activities: Patient will self administer medications as prescribed Patient will attend all scheduled provider appointments Patient will call pharmacy for medication refills Patient will call provider office for new concerns or questions Patient will maintain communication with CCM team: CSW, care guide, pharmacy,  and RN CM Patient will continue to use transportation services through [facility] Patient will make appointment with PCP to discuss upper extremity (arm) swelling Patient will obtain a new battery for her glucometer and will begin monitoring blood sugar at home Patient will follow heart healthy, low salt, low carbohydrate, low sugar diet Patient will continue to work with home health team, currently involved in her care      Plan: Telephone follow up appointment with care management team member scheduled for:  Tuesday, December 27. 2022 at 3:30 pm The patient has been provided with contact information for the care management team and has been advised to call with any health related questions or concerns  [name], RN, BSN, [facility] RN Care Coordination- [place] [phone]: direct office [phone]: mobile

## 2021-03-20 NOTE — Patient Instructions (Signed)
Visit Information  Janet Le, it was nice talking with you today.   I have let Dr. Quay Burow know that you are in need of an order for compression stockings for your legs and one upper extremity; please consider making an appointment with Dr. Quay Burow so you can update her on your needs; she may want to see you int he office before she places orders.  Please stay in touch with CCM Social Worker Marcie Bal) that has been calling you from Dr. Quay Burow' office   I look forward to talking to you again for an update on Tuesday, December 27. 2022 at 3:30 pm- please be listening out for my call that day.  I will call as close to 3:30 pm as possible.   If you need to cancel or re-schedule our telephone visit, please call [phone] and one of our care guides will be happy to assist you.   I look forward to hearing about your progress.   Please don't hesitate to contact me if I can be of assistance to you before our next scheduled telephone appointment.   [name], RN, BSN, [facility] RN Care Coordination- [place] [phone]: direct office [phone]: mobile   PATIENT GOALS:  Goals Addressed             This Visit's Progress    Patient Self-Care Activities   On track    Timeframe:  Long-Range Goal Priority:  Medium Start Date:         02/09/21                    Expected End Date:      02/09/22                 Patient will self administer medications as prescribed Patient will attend all scheduled provider appointments Patient will call pharmacy for medication refills Patient will call provider office for new concerns or questions Patient will maintain communication with CCM team: CSW, care guide, pharmacy, and RN CM Patient will continue to use transportation services through [facility] Patient will make appointment with PCP to discuss upper extremity (arm) swelling Patient will obtain a new battery for her glucometer and will begin monitoring blood sugar  at home Patient will follow heart healthy, low salt, low carbohydrate, low sugar diet Patient will continue to work with home health team, currently involved in her care       Edema Edema is when you have too much fluid in your body or under your skin. Edema may make your legs, feet, and ankles swell up. Swelling is also common in looser tissues, like around your eyes. This is a common condition. It gets more common as you get older. There are many possible causes of edema. Eating too much salt (sodium) and being on your feet or sitting for a long time can cause edema in your legs, feet, and ankles. Hot weather may make edema worse. Edema is usually painless. Your skin may look swollen or shiny. Follow these instructions at home: Keep the swollen body part raised (elevated) above the level of your heart when you are sitting or lying down. Do not sit still or stand for a long time. Do not wear tight clothes. Do not wear garters on your upper legs. Exercise your legs. This can help the swelling go down. Wear elastic bandages or support stockings as told by your doctor. Eat a low-salt (low-sodium) diet to reduce fluid as  told by your doctor. Depending on the cause of your swelling, you may need to limit how much fluid you drink (fluid restriction). Take over-the-counter and prescription medicines only as told by your doctor. Contact a doctor if: Treatment is not working. You have heart, liver, or kidney disease and have symptoms of edema. You have sudden and unexplained weight gain. Get help right away if: You have shortness of breath or chest pain. You cannot breathe when you lie down. You have pain, redness, or warmth in the swollen areas. You have heart, liver, or kidney disease and get edema all of a sudden. You have a fever and your symptoms get worse all of a sudden. Summary Edema is when you have too much fluid in your body or under your skin. Edema may make your legs, feet, and  ankles swell up. Swelling is also common in looser tissues, like around your eyes. Raise (elevate) the swollen body part above the level of your heart when you are sitting or lying down. Follow your doctor's instructions about diet and how much fluid you can drink (fluid restriction). This information is not intended to replace advice given to you by your health care provider. Make sure you discuss any questions you have with your health care provider. Document Revised: 04/06/2020 Document Reviewed: 04/06/2020 Elsevier Patient Education  2022 [employer].   The patient verbalized understanding of instructions, educational materials, and care plan provided today and agreed to receive a mailed copy of patient instructions, educational materials, and care plan  Telephone follow up appointment with care management team member scheduled for:  Tuesday, December 27. 2022 at 3:30 pm The patient has been provided with contact information for the care management team and has been advised to call with any health related questions or concerns  [name], RN, BSN, [facility] RN Care Coordination- [place] [phone]: direct office [phone]: mobile

## 2021-03-24 DIAGNOSIS — I10 Essential (primary) hypertension: Secondary | ICD-10-CM | POA: Diagnosis not present

## 2021-03-30 ENCOUNTER — Other Ambulatory Visit: Payer: Self-pay | Admitting: Internal Medicine

## 2021-03-30 ENCOUNTER — Telehealth: Payer: Self-pay | Admitting: Neurology

## 2021-03-30 NOTE — Telephone Encounter (Signed)
Pt would like a call back to get some things ordered. She needs compression stockings for arms/legs.

## 2021-03-31 NOTE — Telephone Encounter (Signed)
Spoke to pt, Dr.Jaffe do not order Compression stockings.

## 2021-04-04 ENCOUNTER — Encounter: Payer: Medicare (Managed Care) | Admitting: Internal Medicine

## 2021-04-04 ENCOUNTER — Telehealth: Payer: Self-pay

## 2021-04-04 ENCOUNTER — Telehealth: Payer: Self-pay | Admitting: Internal Medicine

## 2021-04-04 NOTE — Telephone Encounter (Signed)
Pt requesting referral to surgeon to address issues with her back.

## 2021-04-04 NOTE — Telephone Encounter (Signed)
Pt states she has not received handicap sticker

## 2021-04-04 NOTE — Progress Notes (Signed)
    Chronic Care Management Pharmacy Assistant   Name: Janet Le  MRN: [id number] DOB: Jan 04, 1956  Reason for Encounter: Medication Sync for Dicyclomine 10mg     Patient states she takes 2 in morning and 1 evening every day. Patient was unable to verify a pill count on the phone encounter this morning.   [name], CPP notified  Marijean Niemann, [place] Clinical Pharmacy Assistant [phone]

## 2021-04-05 NOTE — Telephone Encounter (Signed)
Spoke with patient today and new one mailed out to her.

## 2021-04-05 NOTE — Telephone Encounter (Signed)
She follows with [facility] for her back-she should follow-up with them to see if she is a surgical candidate.  They have doctors in the group to do surgery.

## 2021-04-05 NOTE — Telephone Encounter (Signed)
A referral was ordered.  I am not sure if she is a candidate, but they can determine that.

## 2021-04-06 ENCOUNTER — Telehealth: Payer: Self-pay

## 2021-04-06 ENCOUNTER — Telehealth: Payer: Self-pay | Admitting: *Deleted

## 2021-04-06 DIAGNOSIS — I872 Venous insufficiency (chronic) (peripheral): Secondary | ICD-10-CM | POA: Insufficient documentation

## 2021-04-06 DIAGNOSIS — N6489 Other specified disorders of breast: Secondary | ICD-10-CM

## 2021-04-06 HISTORY — DX: Venous insufficiency (chronic) (peripheral): I87.2

## 2021-04-06 NOTE — Telephone Encounter (Signed)
  Care Management   Follow Up Note   04/06/2021 Name: Janet Le MRN: [id number] DOB: 1956-01-23   Referred by: Binnie Rail, MD Reason for referral : No chief complaint on file.   An unsuccessful telephone outreach was attempted today. The patient was referred to the case management team for assistance with care management and care coordination.   Follow Up Plan: The care management team will reach out to the patient again over the next 7-10 days.   Eduard Clos MSW, LCSW Licensed Clinical Social Worker [facility] [place] [phone]

## 2021-04-06 NOTE — Telephone Encounter (Addendum)
DME printed for bras    [employer] does not do shoes or compression socks - compression socks are not covered by insurance typically.  I can give her a prescription but they are typically not covered  Needs to see podiatry to shoes.    FYI - she was advised by neurology to refrain from driving

## 2021-04-07 ENCOUNTER — Telehealth: Payer: Self-pay

## 2021-04-07 NOTE — Telephone Encounter (Signed)
Please advise as the pt has stated she believes her DULoxetine (CYMBALTA) 60 MG capsule is causing her hair to fall out or another new medication she has started which is rivastigmine (EXELON) 1.5 MG capsule.  Please advise which is the next step the pt should be taking to address the above sxs.  Please call pt at [phone].

## 2021-04-08 DIAGNOSIS — N6489 Other specified disorders of breast: Secondary | ICD-10-CM

## 2021-04-08 HISTORY — DX: Other specified disorders of breast: N64.89

## 2021-04-08 MED ORDER — MEDICAL COMPRESSION SOCKS MISC: 0 refills | Status: DC

## 2021-04-08 NOTE — Telephone Encounter (Signed)
Rx printed

## 2021-04-08 NOTE — Telephone Encounter (Signed)
It is most likely the exelon since that is new - she should discuss with neurology.  She can stop for now if she has not already and see what they say.  She has been on the duloxetine for a while so I doubt that is the cause.

## 2021-04-11 ENCOUNTER — Telehealth: Payer: Medicare (Managed Care)

## 2021-04-11 NOTE — Telephone Encounter (Signed)
Message left for patient today with Dr. Quay Burow recommendations.

## 2021-04-11 NOTE — Telephone Encounter (Signed)
Faxed today

## 2021-04-12 ENCOUNTER — Telehealth: Payer: Self-pay | Admitting: Neurology

## 2021-04-12 NOTE — Telephone Encounter (Signed)
Pt thinks the medicine jaffe put her on for memory is making her hair fall out. Exelon 1.5mg 

## 2021-04-12 NOTE — Telephone Encounter (Signed)
Please advise as the pt has stated she contacted Dr. Tomi Likens about the medication and hair loss concern and he has stated "He does not think its the medication and pt should continue to take it. Pt is asking for Dr. Quay Burow opinion and advice on the next steps she should take.

## 2021-04-12 NOTE — Telephone Encounter (Signed)
It looks like she has an appointment 10/28-let us discuss then.

## 2021-04-12 NOTE — Telephone Encounter (Signed)
Spoke with patient today. 

## 2021-04-12 NOTE — Telephone Encounter (Signed)
Pt advised of Dr.Jaffe note, Hair loss is not a typical side effect of rivastigmine.

## 2021-04-14 ENCOUNTER — Telehealth: Payer: Self-pay | Admitting: Pharmacist

## 2021-04-14 NOTE — Progress Notes (Addendum)
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   amLODipine (NORVASC) 5 MG tablet Take 1 tablet by mouth once daily   atorvastatin (LIPITOR) 10 MG tablet Take 1 tablet by mouth once daily   Blood Glucose Monitoring Suppl (ACCU-CHEK GUIDE ME) w/Device KIT Use to check blood sugar daily   calcium carbonate (OS-CAL) 600 MG TABS tablet Take 600 mg by mouth 2 (two) times daily with a meal.   clotrimazole (LOTRIMIN) 1 % cream SMARTSIG:1 Topical Daily PRN   dicyclomine (BENTYL) 10 MG capsule TAKE 1 TO 2 CAPSULES BY MOUTH THREE TIMES DAILY AS NEEDED.   DULoxetine (CYMBALTA) 60 MG capsule Take 1 capsule (60 mg total) by mouth daily.   Elastic Bandages & Supports (MEDICAL COMPRESSION SOCKS) MISC Compression sock, medium compression 20-30 mmHg. Use daily for venous insufficiency E26.8   folic acid (FOLVITE) 1 MG tablet Take 1 mg by mouth daily.   furosemide (LASIX) 20 MG tablet Take 1 tablet by mouth once daily   glucose blood (ACCU-CHEK GUIDE) test strip Use to check sugars once daily. Dx Code-R73.03   linaclotide (LINZESS) 145 MCG CAPS capsule Take 1 capsule (145 mcg total) by mouth daily.   methotrexate 2.5 MG tablet Take 10 mg by mouth once a week. Mondays   Misc Natural Products (HEALTHY LIVER PO) Take by mouth.   modafinil  (PROVIGIL) 100 MG tablet Take 1 tablet (100 mg total) by mouth daily.   Multiple Vitamin (MULTIVITAMIN) tablet Take 1 tablet by mouth daily.   polyethylene glycol powder (GLYCOLAX/MIRALAX) 17 GM/SCOOP powder Take 1 Container by mouth once.   Probiotic Product (PROBIOTIC-10 PO) Take 6 Billion Cells by mouth.   propranolol (INDERAL) 80 MG tablet Take 1 tablet by mouth twice daily   pyridOXINE (VITAMIN B-6) 50 MG tablet Take 1 tablet (50 mg total) by mouth daily.   rivastigmine (EXELON) 1.5 MG capsule Take 1 capsule (1.5 mg total) by mouth 2 (two) times daily.   telmisartan (MICARDIS) 80 MG tablet Take 1 tablet by mouth once daily   traMADol (ULTRAM) 50 MG tablet Take 1 tablet (50 mg total) by mouth every 6 (six) hours as needed.   No facility-administered encounter medications on file as of 04/14/2021.   BP Readings from Last 3 Encounters:  02/24/21 115/70  02/02/21 106/60  01/13/21 136/76    Lab Results  Component Value Date   HGBA1C 5.7 03/10/2019      Last adherence delivery date:02/27/21      Patient is due for next adherence delivery on: 04/24/21  Spoke with patient on 1024/22 reviewed medications and coordinated delivery.  This delivery to include: Adherence Packaging  30 Days  Amlodipine 5 mg 1 tab at breakfast Atorvastatin 10 mg 1 tab at breakfast Calcium carbonate 600 mg 1 tab at breakfast and evening Dicyclomine 10 mg 1  to 2 caps 3 times daily as needed Duloxetine 60 mg 1 tab at breakfast Furosemide 20 mg 1 tab at breakfast Methotrexate 2.5 mg 1 tab at breakfast once a week on Monday Women's multivitamin 1 tab at breakfast Propranolol 80 mg 1 tab at breakfast and evening Pyridoxine (vit B-6) 50 mg 1 tab at breakfast Telmisartan 80 mg 1 tab at breakfast Folic acid 1 mg 1 tab at breakfast Rivastigmine 1.5 mg 1 tab at breakfast and evening    Patient declined the following medications this month:Patient stated to leave off probiotic for now  Any concerns about your  medications? No  How often do you forget or accidentally miss a dose? Rarely  Do you use a pillbox? Yes  Is patient in packaging Yes  If yes  What is the date on your next pill pack?Patient first delivery  Any concerns or issues with your packaging? No   Refills requested from providers include:amlodipine, atorvastatin, duloxetine, furosemide, propranolol, telmisartan, and rivastigmine  Confirmed delivery date of 04/24/21, advised patient that pharmacy will contact them the morning of delivery.  Recent blood pressure readings are as follows:Patient has not readings   Recent blood glucose readings are as follows:NA  Annual wellness visit in last year? No Most Recent BP reading:115/70 02/24/21  If Diabetic: Most recent A1C reading:03/10/19 Last eye exam / retinopathy screening:NA Last diabetic foot exam: 09/02/18  Ethelene Hal Clinical Pharmacist Assistant [phone]

## 2021-04-17 ENCOUNTER — Telehealth: Payer: Self-pay | Admitting: Neurology

## 2021-04-17 MED ORDER — FOLIC ACID 1 MG PO TABS: 1.0000 mg | ORAL_TABLET | Freq: Every day | ORAL | 3 refills | Status: DC

## 2021-04-17 NOTE — Telephone Encounter (Signed)
Patient called and requested a new orders for socks for left arm, a pair of compression stockings for feet, and shoes.  [employer] [facility] [address]  Attn: Abran Richard.

## 2021-04-17 NOTE — Addendum Note (Signed)
Addended by: Charlton Haws on: 04/17/2021 05:13 PM   Modules accepted: Orders

## 2021-04-17 NOTE — Telephone Encounter (Signed)
Called patient and informed her that per Dr. Tomi Likens she must contact her Primary Care Provider to request arm socks, compression socks, and shoes as Dr. Tomi Likens does not prescribe that. Patient verbalized understanding and had no further questions or concerns.

## 2021-04-18 ENCOUNTER — Telehealth: Payer: Self-pay | Admitting: Internal Medicine

## 2021-04-18 ENCOUNTER — Other Ambulatory Visit: Payer: Self-pay | Admitting: Internal Medicine

## 2021-04-18 ENCOUNTER — Other Ambulatory Visit: Payer: Self-pay | Admitting: Neurology

## 2021-04-18 DIAGNOSIS — M542 Cervicalgia: Secondary | ICD-10-CM

## 2021-04-18 NOTE — Telephone Encounter (Signed)
Type of form received- Disability License Plate   Form placed in- Provider mailbox  Additional instructions from the patient- Notify by phone when complete via phone [phone] or [phone]   Things to remember: [place] office: If form received in person, remind patient that forms take 7-10 business days CMA should attach charge sheet and put on Supervisor's desk

## 2021-04-19 NOTE — Telephone Encounter (Signed)
Form completed and daughter picked up form today.

## 2021-04-20 NOTE — Progress Notes (Signed)
Subjective:    Patient ID: Janet Le, female    DOB: 09-25-1955, 65 y.o.   MRN: [id number]   This visit occurred during the SARS-CoV-2 public health emergency.  Safety protocols were in place, including screening questions prior to the visit, additional usage of staff PPE, and extensive cleaning of exam room while observing appropriate contact time as indicated for disinfecting solutions.    HPI She is here for a physical exam.     Headache on top of head - she first had it this morning.  - she has had the pain 3 times today - it lasts about 3 minutes.  It is a sharp pain.    She is having left hip pain  - she assumes she fell in the middle of the night.  The other night she fell and did not know she fell until after she fell.     Medications and allergies reviewed with patient and updated if appropriate.  Patient Active Problem List   Diagnosis Date Noted   Breast asymmetry 04/08/2021   Venous insufficiency of both lower extremities 04/06/2021   Mild neurocognitive disorder with Lewy bodies 01/25/2021   Cervical radiculopathy 09/07/2020   Visual hallucinations    Attention deficit hyperactivity disorder 06/06/2020   Glaucoma 06/06/2020   Blepharitis 11/07/2019   Type 2 diabetes mellitus without complication, without long-term current use of insulin 10/06/2019   Hypertensive retinopathy of both eyes 10/06/2019   Iridocyclitis 10/06/2019   Lattice degeneration of both retinas 10/06/2019   Pseudophakia of both eyes 10/06/2019   Nocturnal hypoxemia 05/11/2019   Dyspnea on exertion 05/11/2019   Excessive daytime sleepiness 05/11/2019   Primary osteoarthritis involving multiple joints 03/10/2019   Post traumatic stress disorder (PTSD)    Laryngopharyngeal reflux (LPR) 07/03/2018   Bilateral leg edema 06/12/2018   Degeneration of lumbar intervertebral disc 01/09/2018   Spondylolisthesis of lumbar region 01/09/2018   Fatigue 11/14/2017   Arthralgia of hip 11/14/2017    Major depressive disorder 10/08/2017   Generalized anxiety disorder 10/08/2017   Neuropathy 10/07/2017   LVH (left ventricular hypertrophy), mild 05/18/2017   S/P knee surgery 10/30/2016   Chronic low back pain 07/18/2016   Cervicalgia 05/30/2016   Neck pain 05/30/2016   Osteopenia 11/18/2015   Head pain 07/20/2015   Thrombocytopenia 05/30/2015   Pain in left knee 09/08/2014   Gout 12/11/2013   Morbid obesity 11/06/2013   Left lumbar radiculopathy 04/13/2012   Renal cell carcinoma (HCC)    IBS (irritable bowel syndrome) with chronic constipation 11/14/2010   GERD (gastroesophageal reflux disease) 11/14/2010   Hyperlipidemia 03/16/2010   Allergic rhinitis 08/31/2009   Syncope and collapse 05/16/2009   Essential hypertension 05/06/2009    Current Outpatient Medications on File Prior to Visit  Medication Sig Dispense Refill   Accu-Chek FastClix Lancets MISC USE AS DIRECTED UP  TO  1 TIME  DAILY- DX CODE R73.03 100 each 1   acetaminophen (TYLENOL) 325 MG tablet Take 1,300 mg by mouth at bedtime.     amLODipine (NORVASC) 5 MG tablet Take 1 tablet by mouth once daily 90 tablet 0   atorvastatin (LIPITOR) 10 MG tablet Take 1 tablet by mouth once daily 90 tablet 0   Blood Glucose Monitoring Suppl (ACCU-CHEK GUIDE ME) w/Device KIT Use to check blood sugar daily 1 kit 0   calcium carbonate (OS-CAL) 600 MG TABS tablet Take 600 mg by mouth 2 (two) times daily with a meal.     clotrimazole (LOTRIMIN)  1 % cream SMARTSIG:1 Topical Daily PRN     dicyclomine (BENTYL) 10 MG capsule TAKE 1 TO 2 CAPSULES BY MOUTH THREE TIMES DAILY AS NEEDED. 150 capsule 3   DULoxetine (CYMBALTA) 60 MG capsule TAKE ONE CAPSULE BY MOUTH ONCE DAILY 30 capsule 1   Elastic Bandages & Supports (MEDICAL COMPRESSION SOCKS) MISC Compression sock, medium compression 20-30 mmHg. Use daily for venous insufficiency T26.7 3 each 0   folic acid (FOLVITE) 1 MG tablet Take 1 tablet (1 mg total) by mouth daily. 90 tablet 3    furosemide (LASIX) 20 MG tablet TAKE ONE TABLET BY MOUTH ONCE DAILY 90 tablet 0   glucose blood (ACCU-CHEK GUIDE) test strip Use to check sugars once daily. Dx Code-R73.03 100 each 1   linaclotide (LINZESS) 145 MCG CAPS capsule Take 1 capsule (145 mcg total) by mouth daily. 90 capsule 3   methotrexate 2.5 MG tablet Take 10 mg by mouth once a week. Mondays     Misc Natural Products (HEALTHY LIVER PO) Take by mouth.     Multiple Vitamin (MULTIVITAMIN) tablet Take 1 tablet by mouth daily.     polyethylene glycol powder (GLYCOLAX/MIRALAX) 17 GM/SCOOP powder Take 1 Container by mouth once.     Probiotic Product (PROBIOTIC-10 PO) Take 6 Billion Cells by mouth.     propranolol (INDERAL) 80 MG tablet TAKE ONE TABLET BY MOUTH TWICE DAILY 180 tablet 0   pyridOXINE (VITAMIN B-6) 50 MG tablet Take 1 tablet (50 mg total) by mouth daily. 30 tablet 8   rivastigmine (EXELON) 1.5 MG capsule Take 1 capsule (1.5 mg total) by mouth 2 (two) times daily. 60 capsule 0   telmisartan (MICARDIS) 80 MG tablet TAKE ONE TABLET BY MOUTH ONCE DAILY 90 tablet 0   modafinil (PROVIGIL) 100 MG tablet Take 1 tablet (100 mg total) by mouth daily. 30 tablet 0   No current facility-administered medications on file prior to visit.    Past Medical History:  Diagnosis Date   Allergic rhinitis 08/31/2009   Arthralgia of hip 11/14/2017   Attention deficit hyperactivity disorder 06/06/2020   diagnosed as adult w/o formal testing   Bilateral leg edema 06/12/2018   Blepharitis 11/07/2019   Blood dyscrasia    platelets low in past   Cataract    Cervical radiculopathy 09/07/2020   Cervicalgia 05/30/2016   Chronic low back pain 07/18/2016   Degeneration of lumbar intervertebral disc 01/09/2018   Dyspnea on exertion 05/11/2019   Endometriosis    Esophageal stricture    Essential hypertension 05/06/2009   Excessive daytime sleepiness 05/11/2019   External hemorrhoids 08/28/2010   Fatigue 11/14/2017   Generalized anxiety disorder  10/08/2017   GERD (gastroesophageal reflux disease)    Glaucoma 06/06/2020   Gout 12/11/2013   Hiatal hernia    Hypertensive retinopathy of both eyes 10/06/2019   IBS (irritable bowel syndrome) with chronic constipation 11/14/2010   Iridocyclitis 10/06/2019   Laryngopharyngeal reflux (LPR) 07/03/2018   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  No significant change from 2017  dexa 10/2015: t score  Spine -1.3, dual femur -0.9  Took alendronate 2014-2019   Post traumatic stress disorder (PTSD)    Primary osteoarthritis involving multiple joints 03/10/2019   Bilateral hips, knees   Pseudophakia of both eyes 10/06/2019   Pure hypercholesterolemia 03/16/2010   Renal cell carcinoma    R cryoablation by IR 02/16/11, Dr. Kathlene Cote  Followed by Dr. Janee Morn  No evidence of residual disease on CT 12/13 and 12/14   Rib pain on left side 01/03/2021   S/P knee surgery 10/30/2016   Spondylolisthesis of lumbar region 01/09/2018   Syncope and collapse 05/16/2009   since childhood; associated with extreme heat   TB lung, latent 11/25/2019   Thrombocytopenia 05/30/2015   Saw hem 02/2017 - mild, chronic - advised to stop protonix, no concerning cause, plan for pcp to monitor   Tinnitus of right ear 07/03/2018   Type 2 diabetes mellitus without complication, without long-term current use of insulin 10/06/2019   Visual hallucinations    Vulvar itching 01/03/2021    Past Surgical History:  Procedure Laterality Date   BREAST CYST EXCISION Bilateral over 10 years ago   No  visable scar    CATARACT EXTRACTION, BILATERAL Bilateral 2020   COLONOSCOPY     fallopian tube removed     FUNCTIONAL ENDOSCOPIC SINUS SURGERY     HEMORRHOID BANDING     LASER ABLATION OF THE CERVIX     NASAL SINUS SURGERY     RENAL CRYOABLATION  02/16/11   R kidney due to Edwards (IR procedure)   TOTAL KNEE ARTHROPLASTY Right 10/30/2016   Procedure: RIGHT TOTAL KNEE ARTHROPLASTY;  Surgeon: Meredith Pel, MD;  Location: [facility];  Service: Orthopedics;  Laterality: Right;   TUBAL LIGATION     UMBILICAL HERNIA REPAIR      Social History   Socioeconomic History   Marital status: Legally Separated    Spouse name: Not on file   Number of children: 4   Years of education: 12   Highest education level: High school graduate  Occupational History   Occupation: Disabled  Tobacco Use   Smoking status: Never   Smokeless tobacco: Never  Vaping Use   Vaping Use: Never used  Substance and Sexual Activity   Alcohol use: No    Comment: rare   Drug use: No   Sexual activity: Never  Other Topics Concern   Not on file  Social History Narrative   Right handed   Social Determinants of Health   Financial Resource Strain: Not on file  Food Insecurity: Not on file  Transportation Needs: Not on file  Physical Activity: Not on file  Stress: Not on file  Social Connections: Not on file    Family History  Problem Relation Age of Onset   Diabetes Mother    Hypertension Mother    Hyperlipidemia Mother    Heart disease Mother    Stroke Mother    Kidney disease Mother    Thyroid disease Mother    Sleep apnea Mother    Obesity Mother    Memory loss Mother    Kidney disease Father    Prostate cancer Father    Alcoholism Father    Alcohol abuse Father    Multiple sclerosis Sister    Colitis Brother    Alcohol abuse Brother    Leukemia Brother    Alcohol abuse Daughter    Breast cancer Maternal Aunt    Breast cancer Cousin    Colon polyps Other  Colon cancer Neg Hx    Esophageal  cancer Neg Hx    Pancreatic cancer Neg Hx    Rectal cancer Neg Hx    Stomach cancer Neg Hx     Review of Systems  Constitutional:  Negative for chills and fever.  Eyes:  Negative for visual disturbance.  Respiratory:  Negative for cough, shortness of breath and wheezing.   Cardiovascular:  Positive for palpitations (rare) and leg swelling. Negative for chest pain.  Gastrointestinal:  Positive for constipation. Negative for abdominal pain, blood in stool and diarrhea.  Genitourinary:  Negative for dysuria.  Musculoskeletal:  Positive for arthralgias (left knee) and back pain (with certain activity).  Skin:  Negative for color change and rash.  Neurological:  Positive for headaches (head pain - no headaches). Negative for dizziness and light-headedness.  Psychiatric/Behavioral:  Positive for dysphoric mood (bouts of depresion). The patient is nervous/anxious.       Objective:   Vitals:   04/21/21 1427  BP: 128/82  Pulse: 63  Temp: 98.3 F (36.8 C)  SpO2: 99%   Filed Weights   04/21/21 1427  Weight: 233 lb (105.7 kg)   Body mass index is 42.62 kg/m.  BP Readings from Last 3 Encounters:  04/21/21 128/82  02/24/21 115/70  02/02/21 106/60    Wt Readings from Last 3 Encounters:  04/21/21 233 lb (105.7 kg)  02/24/21 228 lb (103.4 kg)  02/02/21 230 lb (104.3 kg)     Physical Exam Constitutional: She appears well-developed and well-nourished. No distress.  HENT:  Head: Normocephalic and atraumatic.  Right Ear: External ear normal. Normal ear canal and TM Left Ear: External ear normal.  Normal ear canal and TM Mouth/Throat: Oropharynx is clear and moist.  Eyes: Conjunctivae and EOM are normal.  Neck: Neck supple. No tracheal deviation present. No thyromegaly present.  No carotid bruit  Cardiovascular: Normal rate, regular rhythm and normal heart sounds.   No murmur heard.  No edema. Pulmonary/Chest: Effort normal and breath sounds normal. No respiratory distress. She  has no wheezes. She has no rales.  Breast: deferred   Abdominal: Soft. She exhibits no distension. There is no tenderness.  Lymphadenopathy: She has no cervical adenopathy.  Skin: Skin is warm and dry. She is not diaphoretic.  Psychiatric: She has a normal mood and affect. Her behavior is normal.     Lab Results  Component Value Date   WBC 6.0 04/21/2021   HGB 12.0 04/21/2021   HCT 36.2 04/21/2021   PLT 146.0 (L) 04/21/2021   GLUCOSE 78 04/21/2021   CHOL 157 04/21/2021   TRIG 72.0 04/21/2021   HDL 58.70 04/21/2021   LDLDIRECT 143.8 07/01/2012   LDLCALC 84 04/21/2021   ALT 58 (H) 04/21/2021   AST 47 (H) 04/21/2021   NA 140 04/21/2021   K 4.1 04/21/2021   CL 106 04/21/2021   CREATININE 0.83 04/21/2021   BUN 12 04/21/2021   CO2 29 04/21/2021   TSH 2.46 04/21/2021   INR 0.91 02/08/2011   HGBA1C 5.4 04/21/2021   MICROALBUR 1.0 01/05/2015         Assessment & Plan:   Physical exam: Screening blood work  ordered Exercise  ? regular Weight  encouraged weight loss Substance abuse  none   Reviewed recommended immunizations.   Health Maintenance  Topic Date Due   Pneumonia Vaccine 32+ Years old (2 - PPSV23 if available, else PCV20) 07/02/2015   PAP SMEAR-Modifier  08/05/2019   FOOT EXAM  09/02/2019  DEXA SCAN  06/10/2020   INFLUENZA VACCINE  01/23/2021   COVID-19 Vaccine (4 - Booster for Pfizer series) 03/08/2021   HEMOGLOBIN A1C  10/20/2021   MAMMOGRAM  11/25/2021   TETANUS/TDAP  01/08/2023   COLONOSCOPY (Pts 45-38yr Insurance coverage will need to be confirmed)  06/10/2024   Hepatitis C Screening  Completed   HIV Screening  Completed   Zoster Vaccines- Shingrix  Completed   HPV VACCINES  Aged Out          See Problem List for Assessment and Plan of chronic medical problems.

## 2021-04-21 ENCOUNTER — Ambulatory Visit: Payer: Medicare (Managed Care) | Admitting: Neurology

## 2021-04-21 ENCOUNTER — Other Ambulatory Visit: Payer: Self-pay

## 2021-04-21 ENCOUNTER — Ambulatory Visit (INDEPENDENT_AMBULATORY_CARE_PROVIDER_SITE_OTHER): Payer: 59 | Admitting: Internal Medicine

## 2021-04-21 ENCOUNTER — Encounter: Payer: Self-pay | Admitting: Internal Medicine

## 2021-04-21 VITALS — BP 128/82 | HR 63 | Temp 98.3°F | Ht 62.0 in | Wt 233.0 lb

## 2021-04-21 DIAGNOSIS — I10 Essential (primary) hypertension: Secondary | ICD-10-CM

## 2021-04-21 DIAGNOSIS — Z0000 Encounter for general adult medical examination without abnormal findings: Secondary | ICD-10-CM | POA: Diagnosis not present

## 2021-04-21 DIAGNOSIS — R350 Frequency of micturition: Secondary | ICD-10-CM | POA: Diagnosis not present

## 2021-04-21 DIAGNOSIS — Z23 Encounter for immunization: Secondary | ICD-10-CM

## 2021-04-21 DIAGNOSIS — M8589 Other specified disorders of bone density and structure, multiple sites: Secondary | ICD-10-CM

## 2021-04-21 DIAGNOSIS — G3183 Dementia with Lewy bodies: Secondary | ICD-10-CM

## 2021-04-21 DIAGNOSIS — E119 Type 2 diabetes mellitus without complications: Secondary | ICD-10-CM

## 2021-04-21 DIAGNOSIS — F02A0 Dementia in other diseases classified elsewhere, mild, without behavioral disturbance, psychotic disturbance, mood disturbance, and anxiety: Secondary | ICD-10-CM

## 2021-04-21 DIAGNOSIS — F331 Major depressive disorder, recurrent, moderate: Secondary | ICD-10-CM

## 2021-04-21 DIAGNOSIS — R600 Localized edema: Secondary | ICD-10-CM

## 2021-04-21 DIAGNOSIS — E782 Mixed hyperlipidemia: Secondary | ICD-10-CM | POA: Diagnosis not present

## 2021-04-21 DIAGNOSIS — R519 Headache, unspecified: Secondary | ICD-10-CM

## 2021-04-21 LAB — TSH: TSH: 2.46 u[IU]/mL (ref 0.35–5.50)

## 2021-04-21 LAB — COMPREHENSIVE METABOLIC PANEL
ALT: 58 U/L — ABNORMAL HIGH (ref 0–35)
AST: 47 U/L — ABNORMAL HIGH (ref 0–37)
Albumin: 3.8 g/dL (ref 3.5–5.2)
Alkaline Phosphatase: 98 U/L (ref 39–117)
BUN: 12 mg/dL (ref 6–23)
CO2: 29 mEq/L (ref 19–32)
Calcium: 9.8 mg/dL (ref 8.4–10.5)
Chloride: 106 mEq/L (ref 96–112)
Creatinine, Ser: 0.83 mg/dL (ref 0.40–1.20)
GFR: 73.81 mL/min (ref >60.00)
Glucose, Bld: 78 mg/dL (ref 70–99)
Potassium: 4.1 mEq/L (ref 3.5–5.1)
Sodium: 140 mEq/L (ref 135–145)
Total Bilirubin: 0.6 mg/dL (ref 0.2–1.2)
Total Protein: 7 g/dL (ref 6.0–8.3)

## 2021-04-21 LAB — CBC WITH DIFFERENTIAL/PLATELET
Basophils Absolute: 0 10*3/uL (ref 0.0–0.1)
Basophils Relative: 0.2 % (ref 0.0–3.0)
Eosinophils Absolute: 0.1 10*3/uL (ref 0.0–0.7)
Eosinophils Relative: 1 % (ref 0.0–5.0)
HCT: 36.2 % (ref 36.0–46.0)
Hemoglobin: 12 g/dL (ref 12.0–15.0)
Lymphocytes Relative: 22.5 % (ref 12.0–46.0)
Lymphs Abs: 1.4 10*3/uL (ref 0.7–4.0)
MCHC: 33.1 g/dL (ref 30.0–36.0)
MCV: 106.8 fl — ABNORMAL HIGH (ref 78.0–100.0)
Monocytes Absolute: 0.2 10*3/uL (ref 0.1–1.0)
Monocytes Relative: 2.8 % — ABNORMAL LOW (ref 3.0–12.0)
Neutro Abs: 4.4 10*3/uL (ref 1.4–7.7)
Neutrophils Relative %: 73.5 % (ref 43.0–77.0)
Platelets: 146 10*3/uL — ABNORMAL LOW (ref 150.0–400.0)
RBC: 3.39 Mil/uL — ABNORMAL LOW (ref 3.87–5.11)
RDW: 14.6 % (ref 11.5–15.5)
WBC: 6 10*3/uL (ref 4.0–10.5)

## 2021-04-21 LAB — URINALYSIS, ROUTINE W REFLEX MICROSCOPIC
Bilirubin Urine: NEGATIVE
Hgb urine dipstick: NEGATIVE
Ketones, ur: NEGATIVE
Nitrite: NEGATIVE
Specific Gravity, Urine: 1.02 (ref 1.000–1.030)
Total Protein, Urine: NEGATIVE
Urine Glucose: NEGATIVE
Urobilinogen, UA: 1 (ref 0.0–1.0)
pH: 7 (ref 5.0–8.0)

## 2021-04-21 LAB — LIPID PANEL
Cholesterol: 157 mg/dL (ref 0–200)
HDL: 58.7 mg/dL (ref >39.00)
LDL Cholesterol: 84 mg/dL (ref 0–99)
NonHDL: 98.04
Total CHOL/HDL Ratio: 3
Triglycerides: 72 mg/dL (ref 0.0–149.0)
VLDL: 14.4 mg/dL (ref 0.0–40.0)

## 2021-04-21 LAB — HEMOGLOBIN A1C: Hgb A1c MFr Bld: 5.4 % (ref 4.6–6.5)

## 2021-04-21 NOTE — Assessment & Plan Note (Signed)
Chronic DEXA due-ordered Encourage regular exercise

## 2021-04-21 NOTE — Patient Instructions (Addendum)
Flu immunization and pneumonia vaccine administered today.     Consider getting the new covid booster at your pharmacy.    A bone density was ordered.  You are due for your mammogram -- call the breast center to schedule both --     [facility]  [phone]    Blood work was ordered.     Medications changes include :   none   A referral was ordered for  podiatry for diabetic shoes.        Someone from their office will call you to schedule an appointment.    Please followup in 6 months    [address], [place] [id number] [phone]   [facility] [employer] [place], [place] [address] from [place] Maintenance, Female Adopting a healthy lifestyle and getting preventive care are important in promoting health and wellness. Ask your health care provider about: The right schedule for you to have regular tests and exams. Things you can do on your own to prevent diseases and keep yourself healthy. What should I know about diet, weight, and exercise? Eat a healthy diet  Eat a diet that includes plenty of vegetables, fruits, low-fat dairy products, and lean protein. Do not eat a lot of foods that are high in solid fats, added sugars, or sodium. Maintain a healthy weight Body mass index (BMI) is used to identify weight problems. It estimates body fat based on height and weight. Your health care provider can help determine your BMI and help you achieve or maintain a healthy weight. Get regular exercise Get regular exercise. This is one of the most important things you can do for your health. Most adults should: Exercise for at least 150 minutes each week. The exercise should increase your heart rate and make you sweat (moderate-intensity exercise). Do strengthening exercises  at least twice a week. This is in addition to the moderate-intensity exercise. Spend less time sitting. Even light physical activity can be beneficial. Watch cholesterol and blood lipids Have your blood tested for lipids and cholesterol at 65 years of age, then have this test every 5 years. Have your cholesterol levels checked more often if: Your lipid or cholesterol levels are high. You are older than 65 years of age. You are at high risk for heart disease. What should I know about cancer screening? Depending on your health history and family history, you may need to have cancer screening at various ages. This may include screening for: Breast cancer. Cervical cancer. Colorectal cancer. Skin cancer. Lung cancer. What should I know about heart disease, diabetes, and high blood pressure? Blood pressure and heart disease High blood pressure causes heart disease and increases the risk of stroke. This is more likely to develop in people who have high blood pressure readings, are of African descent, or are overweight. Have your blood pressure checked: Every 3-5 years if you are 42-2 years of age. Every year if you are 89 years old or older. Diabetes Have regular diabetes screenings. This checks your fasting blood sugar level. Have the screening done: Once every three years after age 68 if you are at a normal weight and have a low risk for diabetes. More often and at a younger age if you are overweight or have a high risk for diabetes. What should I know about preventing infection? Hepatitis  B If you have a higher risk for hepatitis B, you should be screened for this virus. Talk with your health care provider to find out if you are at risk for hepatitis B infection. Hepatitis C Testing is recommended for: Everyone born from [birth year] through 1965. Anyone with known risk factors for hepatitis C. Sexually transmitted infections (STIs) Get screened for STIs, including gonorrhea and chlamydia,  if: You are sexually active and are younger than 65 years of age. You are older than 65 years of age and your health care provider tells you that you are at risk for this type of infection. Your sexual activity has changed since you were last screened, and you are at increased risk for chlamydia or gonorrhea. Ask your health care provider if you are at risk. Ask your health care provider about whether you are at high risk for HIV. Your health care provider may recommend a prescription medicine to help prevent HIV infection. If you choose to take medicine to prevent HIV, you should first get tested for HIV. You should then be tested every 3 months for as long as you are taking the medicine. Pregnancy If you are about to stop having your period (premenopausal) and you may become pregnant, seek counseling before you get pregnant. Take 400 to 800 micrograms (mcg) of folic acid every day if you become pregnant. Ask for birth control (contraception) if you want to prevent pregnancy. Osteoporosis and menopause Osteoporosis is a disease in which the bones lose minerals and strength with aging. This can result in bone fractures. If you are 83 years old or older, or if you are at risk for osteoporosis and fractures, ask your health care provider if you should: Be screened for bone loss. Take a calcium or vitamin D supplement to lower your risk of fractures. Be given hormone replacement therapy (HRT) to treat symptoms of menopause. Follow these instructions at home: Lifestyle Do not use any products that contain nicotine or tobacco, such as cigarettes, e-cigarettes, and chewing tobacco. If you need help quitting, ask your health care provider. Do not use street drugs. Do not share needles. Ask your health care provider for help if you need support or information about quitting drugs. Alcohol use Do not drink alcohol if: Your health care provider tells you not to drink. You are pregnant, may be pregnant,  or are planning to become pregnant. If you drink alcohol: Limit how much you use to 0-1 drink a day. Limit intake if you are breastfeeding. Be aware of how much alcohol is in your drink. In the [place] one drink equals one 12 oz bottle of beer (355 mL), one 5 oz glass of wine (148 mL), or one 1 oz glass of hard liquor (44 mL). General instructions Schedule regular health, dental, and eye exams. Stay current with your vaccines. Tell your health care provider if: You often feel depressed. You have ever been abused or do not feel safe at home. Summary Adopting a healthy lifestyle and getting preventive care are important in promoting health and wellness. Follow your health care provider's instructions about healthy diet, exercising, and getting tested or screened for diseases. Follow your health care provider's instructions on monitoring your cholesterol and blood pressure. This information is not intended to replace advice given to you by your health care provider. Make sure you discuss any questions you have with your health care provider. Document Revised: 08/19/2020 Document Reviewed: 06/04/2018 Elsevier Patient Education  2022 [employer].

## 2021-04-21 NOTE — Assessment & Plan Note (Signed)
Chronic Check A1c Diet controlled Encouraged healthy diet, regular exercise

## 2021-04-21 NOTE — Assessment & Plan Note (Signed)
Chronic Blood pressure well controlled CMP Continue amlodipine 5 mg daily, propranolol 80 mg twice daily, telmisartan 80 mg daily

## 2021-04-21 NOTE — Assessment & Plan Note (Signed)
Chronic Fairly controlled Continue Cymbalta 60 mg daily

## 2021-04-21 NOTE — Assessment & Plan Note (Signed)
Chronic Regular exercise and healthy diet encouraged Check lipid panel  Continue atorvastatin 10 mg daily 

## 2021-04-21 NOTE — Assessment & Plan Note (Signed)
Chronic Edema well controlled-no edema on exam today I have ordered compression socks for her bilaterally Continue Lasix 20 mg daily CMP

## 2021-04-21 NOTE — Assessment & Plan Note (Signed)
Chronic Following with Dr. Tomi Likens On Exelon 1.5 mg twice daily, Provigil 100 mg daily

## 2021-04-21 NOTE — Assessment & Plan Note (Signed)
Acute Likely neurology She will monitor for now-can discuss with neurology if she sees him prior to me

## 2021-04-22 LAB — URINE CULTURE

## 2021-04-26 NOTE — Telephone Encounter (Signed)
Patient requesting a call back to discuss bariatric referral

## 2021-04-26 NOTE — Addendum Note (Signed)
Addended by: Marcina Millard on: 04/26/2021 04:39 PM   Modules accepted: Orders

## 2021-04-27 ENCOUNTER — Telehealth: Payer: Self-pay | Admitting: *Deleted

## 2021-04-27 ENCOUNTER — Telehealth: Payer: Self-pay | Admitting: Neurology

## 2021-04-27 NOTE — Telephone Encounter (Signed)
Patient called to report she experienced a throbbing headache the other day. She had never experienced anything like that before, she said. She'd like Dr. Georgie Chard opinion as to whether she needs to be concerned.  The problem has gone away since then and she is doing okay, she said.

## 2021-04-27 NOTE — Telephone Encounter (Signed)
Spoke with patient today. 

## 2021-04-27 NOTE — Telephone Encounter (Signed)
Telephone call to pt, Advised pt if this a time event and never happened before please advise her pcp. However if this is a on going event that needs to be evaluated by [facility] please  keep a diary of the events to have for the appt that needs to be scheduled to see Dr.Jaffe to discuss these headache.  Pt verbalized understanding.

## 2021-04-27 NOTE — Telephone Encounter (Signed)
Patient is calling to cancel upcoming appointment, please call to reschedule.

## 2021-04-28 ENCOUNTER — Ambulatory Visit: Payer: Medicare (Managed Care) | Admitting: Physician Assistant

## 2021-05-01 ENCOUNTER — Ambulatory Visit: Payer: 59 | Admitting: Podiatry

## 2021-05-03 ENCOUNTER — Other Ambulatory Visit: Payer: Self-pay | Admitting: Internal Medicine

## 2021-05-03 ENCOUNTER — Other Ambulatory Visit: Payer: Self-pay

## 2021-05-03 ENCOUNTER — Telehealth: Payer: Self-pay | Admitting: Neurology

## 2021-05-03 DIAGNOSIS — M8589 Other specified disorders of bone density and structure, multiple sites: Secondary | ICD-10-CM

## 2021-05-03 MED ORDER — RIVASTIGMINE TARTRATE 1.5 MG PO CAPS: ORAL_CAPSULE | ORAL | 0 refills | Status: DC

## 2021-05-03 NOTE — Telephone Encounter (Signed)
Called patient and informed her that Dr. Tomi Likens will go ahead and refill her medication. Informed patient that because she has been off of the medication Dr. Tomi Likens would like to titrate her up. Explained to patient that she is to take 1 capsule (1.5 mg) twice daily for one week and then increase to 2 capsules (3 mg) twice daily. Patient verbalized understanding and had no further questions or concerns. Patient is aware [facility]

## 2021-05-03 NOTE — Telephone Encounter (Signed)
Pt is calling in, she lost her exelon. She took it about a week and lost it. Doesn't know what else to do. She hid it and cant find it

## 2021-05-08 ENCOUNTER — Other Ambulatory Visit: Payer: Self-pay | Admitting: Physician Assistant

## 2021-05-08 ENCOUNTER — Other Ambulatory Visit: Payer: Self-pay

## 2021-05-08 ENCOUNTER — Ambulatory Visit (INDEPENDENT_AMBULATORY_CARE_PROVIDER_SITE_OTHER): Payer: 59 | Admitting: Podiatry

## 2021-05-08 DIAGNOSIS — R609 Edema, unspecified: Secondary | ICD-10-CM

## 2021-05-08 DIAGNOSIS — E0843 Diabetes mellitus due to underlying condition with diabetic autonomic (poly)neuropathy: Secondary | ICD-10-CM | POA: Diagnosis not present

## 2021-05-08 DIAGNOSIS — E119 Type 2 diabetes mellitus without complications: Secondary | ICD-10-CM | POA: Diagnosis not present

## 2021-05-08 NOTE — Progress Notes (Signed)
HPI: 65 y.o. female presenting today PMHx diabetes mellitus presenting today for routine foot exam.  Patient is slightly concerned due to some discoloration of the skin to the bilateral forefoot.  She has no pain associated to the feet.  She does have a history of chronic edema and is requesting a prescription for compression hose  Past Medical History:  Diagnosis Date   Allergic rhinitis 08/31/2009   Arthralgia of hip 11/14/2017   Attention deficit hyperactivity disorder 06/06/2020   diagnosed as adult w/o formal testing   Bilateral leg edema 06/12/2018   Blepharitis 11/07/2019   Blood dyscrasia    platelets low in past   Cataract    Cervical radiculopathy 09/07/2020   Cervicalgia 05/30/2016   Chronic low back pain 07/18/2016   Degeneration of lumbar intervertebral disc 01/09/2018   Dyspnea on exertion 05/11/2019   Endometriosis    Esophageal stricture    Essential hypertension 05/06/2009   Excessive daytime sleepiness 05/11/2019   External hemorrhoids 08/28/2010   Fatigue 11/14/2017   Generalized anxiety disorder 10/08/2017   GERD (gastroesophageal reflux disease)    Glaucoma 06/06/2020   Gout 12/11/2013   Hiatal hernia    Hypertensive retinopathy of both eyes 10/06/2019   IBS (irritable bowel syndrome) with chronic constipation 11/14/2010   Iridocyclitis 10/06/2019   Laryngopharyngeal reflux (LPR) 07/03/2018   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  No significant change from 2017  dexa 10/2015: t score  Spine -1.3, dual femur -0.9  Took  alendronate 2014-2019   Post traumatic stress disorder (PTSD)    Primary osteoarthritis involving multiple joints 03/10/2019   Bilateral hips, knees   Pseudophakia of both eyes 10/06/2019   Pure hypercholesterolemia 03/16/2010   Renal cell carcinoma    R cryoablation by IR 02/16/11, Dr. Kathlene Cote  Followed by Dr. Janee Morn  No evidence of residual disease on CT 12/13 and 12/14   Rib pain on left side 01/03/2021   S/P knee surgery 10/30/2016   Spondylolisthesis of lumbar region 01/09/2018   Syncope and collapse 05/16/2009   since childhood; associated with extreme heat   TB lung, latent 11/25/2019   Thrombocytopenia 05/30/2015   Saw hem 02/2017 - mild, chronic - advised to stop protonix, no concerning cause, plan for pcp to monitor   Tinnitus of right ear 07/03/2018   Type 2 diabetes mellitus without complication, without long-term current use of insulin 10/06/2019   Visual hallucinations    Vulvar itching 01/03/2021     Physical Exam: General: The patient is alert and oriented x3 in no acute distress.  Dermatology: Skin is warm, dry and supple bilateral lower extremities. Negative for open lesions or macerations.  There are some darker discoloration to the frictional areas of the skin to the bilateral forefoot.  Melanin pigment also noted within the nail plates but this appears to be normal with no atypia or concern for malignancy  Vascular: Palpable pedal pulses bilaterally.  Moderate edema noted bilateral lower extremities.  Neurological: Epicritic and protective threshold diminished bilaterally.   Musculoskeletal Exam: Range of motion within  normal limits to all pedal and ankle joints bilateral. Muscle strength 5/5 in all groups bilateral.    Assessment: 1.  Encounter for diabetic foot exam 2.  Edema bilateral lower extremities   Plan of Care:  1. Patient evaluated.  Comprehensive diabetic foot exam performed today 2.  Prescription provided for compression hose moderate  compression 30-40mmHg provided for the patient 3.  Appointment with Pedorthist for diabetic shoes and insoles after the beginning of the year 4.  Return to clinic annually      Edrick Kins, DPM [facility]  Dr. Edrick Kins, DPM    [address], [place] [address]                Office [phone]  Fax [phone]

## 2021-05-09 ENCOUNTER — Ambulatory Visit (INDEPENDENT_AMBULATORY_CARE_PROVIDER_SITE_OTHER): Payer: Commercial Managed Care - HMO | Admitting: *Deleted

## 2021-05-09 ENCOUNTER — Ambulatory Visit: Payer: Medicare (Managed Care) | Admitting: Neurology

## 2021-05-09 DIAGNOSIS — G3183 Dementia with Lewy bodies: Secondary | ICD-10-CM

## 2021-05-09 DIAGNOSIS — E119 Type 2 diabetes mellitus without complications: Secondary | ICD-10-CM

## 2021-05-09 DIAGNOSIS — I10 Essential (primary) hypertension: Secondary | ICD-10-CM

## 2021-05-09 NOTE — Chronic Care Management (AMB) (Signed)
Chronic Care Management   CCM RN Visit Note  05/09/2021 Name: Janet Le MRN: [id number] DOB: [birth year]  Subjective: Janet Le is a 65 y.o. year old female who is a primary care patient of Burns, Claudina Lick, MD. The care management team was consulted for assistance with disease management and care coordination needs.    Engaged with patient by telephone for  acute/ unexpected follow up call  in response to provider referral for case management and/or care coordination services.   Consent to Services:  The patient was given information about Chronic Care Management services, agreed to services, and gave verbal consent prior to initiation of services.  Please see initial visit note for detailed documentation.  Patient agreed to services and verbal consent obtained.   Assessment: Review of patient past medical history, allergies, medications, health status, including review of consultants reports, laboratory and other test data, was performed as part of comprehensive evaluation and provision of chronic care management services.   CCM Care Plan Allergies  Allergen Reactions   Sertraline Hcl Other (See Comments)    Spasms; numbness   Ace Inhibitors Cough   Codeine Palpitations   Darifenacin Hydrobromide Er Other (See Comments)    Pt states made her feel queezy & drunk   Gabapentin Other (See Comments)    Hallucinations   Pravastatin Other (See Comments)    myalgias   Propoxyphene Hcl Palpitations   Wellbutrin [Bupropion Hcl] Other (See Comments)    Numbness of mouth/lips   Outpatient Encounter Medications as of 05/09/2021  Medication Sig   Accu-Chek FastClix Lancets MISC USE AS DIRECTED UP  TO  1 TIME  DAILY- DX CODE R73.03   acetaminophen (TYLENOL) 325 MG tablet Take 1,300 mg by mouth at bedtime.   amLODipine (NORVASC) 5 MG tablet Take 1 tablet by mouth once daily   atorvastatin (LIPITOR) 10 MG tablet Take 1 tablet by mouth once daily   Blood Glucose Monitoring Suppl  (ACCU-CHEK GUIDE ME) w/Device KIT Use to check blood sugar daily   calcium carbonate (OS-CAL) 600 MG TABS tablet Take 600 mg by mouth 2 (two) times daily with a meal.   clotrimazole (LOTRIMIN) 1 % cream SMARTSIG:1 Topical Daily PRN   dicyclomine (BENTYL) 10 MG capsule TAKE 1 TO 2 CAPSULES BY MOUTH THREE TIMES DAILY AS NEEDED   DULoxetine (CYMBALTA) 60 MG capsule TAKE ONE CAPSULE BY MOUTH ONCE DAILY   Elastic Bandages & Supports (MEDICAL COMPRESSION SOCKS) MISC Compression sock, medium compression 20-30 mmHg. Use daily for venous insufficiency W38.9   folic acid (FOLVITE) 1 MG tablet Take 1 tablet (1 mg total) by mouth daily.   furosemide (LASIX) 20 MG tablet TAKE ONE TABLET BY MOUTH ONCE DAILY   glucose blood (ACCU-CHEK GUIDE) test strip Use to check sugars once daily. Dx Code-R73.03   linaclotide (LINZESS) 145 MCG CAPS capsule Take 1 capsule (145 mcg total) by mouth daily.   methotrexate 2.5 MG tablet Take 10 mg by mouth once a week. Mondays   Misc Natural Products (HEALTHY LIVER PO) Take by mouth.   modafinil (PROVIGIL) 100 MG tablet Take 1 tablet (100 mg total) by mouth daily.   Multiple Vitamin (MULTIVITAMIN) tablet Take 1 tablet by mouth daily.   polyethylene glycol powder (GLYCOLAX/MIRALAX) 17 GM/SCOOP powder Take 1 Container by mouth once.   Probiotic Product (PROBIOTIC-10 PO) Take 6 Billion Cells by mouth.   propranolol (INDERAL) 80 MG tablet TAKE ONE TABLET BY MOUTH TWICE DAILY   pyridOXINE (VITAMIN B-6) 50 MG  tablet Take 1 tablet (50 mg total) by mouth daily.   rivastigmine (EXELON) 1.5 MG capsule Take 1.5 mg twice daily for one week then increase to 3 mg twice daily.   telmisartan (MICARDIS) 80 MG tablet TAKE ONE TABLET BY MOUTH ONCE DAILY   No facility-administered encounter medications on file as of 05/09/2021.   Patient Active Problem List   Diagnosis Date Noted   Breast asymmetry 04/08/2021   Venous insufficiency of both lower extremities 04/06/2021   Mild neurocognitive  disorder with Lewy bodies 01/25/2021   Cervical radiculopathy 09/07/2020   Visual hallucinations    Attention deficit hyperactivity disorder 06/06/2020   Glaucoma 06/06/2020   Blepharitis 11/07/2019   Type 2 diabetes mellitus without complication, without long-term current use of insulin 10/06/2019   Hypertensive retinopathy of both eyes 10/06/2019   Iridocyclitis 10/06/2019   Lattice degeneration of both retinas 10/06/2019   Pseudophakia of both eyes 10/06/2019   Nocturnal hypoxemia 05/11/2019   Dyspnea on exertion 05/11/2019   Excessive daytime sleepiness 05/11/2019   Primary osteoarthritis involving multiple joints 03/10/2019   Post traumatic stress disorder (PTSD)    Laryngopharyngeal reflux (LPR) 07/03/2018   Bilateral leg edema 06/12/2018   Degeneration of lumbar intervertebral disc 01/09/2018   Spondylolisthesis of lumbar region 01/09/2018   Fatigue 11/14/2017   Arthralgia of hip 11/14/2017   Major depressive disorder 10/08/2017   Generalized anxiety disorder 10/08/2017   Neuropathy 10/07/2017   LVH (left ventricular hypertrophy), mild 05/18/2017   S/P knee surgery 10/30/2016   Chronic low back pain 07/18/2016   Cervicalgia 05/30/2016   Neck pain 05/30/2016   Osteopenia 11/18/2015   Head pain 07/20/2015   Thrombocytopenia 05/30/2015   Pain in left knee 09/08/2014   Gout 12/11/2013   Morbid obesity 11/06/2013   Left lumbar radiculopathy 04/13/2012   Renal cell carcinoma (HCC)    IBS (irritable bowel syndrome) with chronic constipation 11/14/2010   GERD (gastroesophageal reflux disease) 11/14/2010   Hyperlipidemia 03/16/2010   Allergic rhinitis 08/31/2009   Syncope and collapse 05/16/2009   Essential hypertension 05/06/2009   Conditions to be addressed/monitored:  HTN, DMII, and cognitive decline  Care Plan : RN Care Manager Plan of Care  Updates made by Janet Royalty, RN since 05/09/2021 12:00 AM     Problem: Chronic Disease Management Needs   Priority:  Medium     Long-Range Goal: Development of plan of care for long term chronic disease management   Start Date: 02/09/2021  Expected End Date: 02/09/2022  Priority: Medium  Note:   Current Barriers:  Chronic Disease Management support and education needs related to HTN, DMII, and cognitive decline/ impairment Patient lives alone- reports limited ability to care for self in setting of progressive cognitive decline with recent Lewy Body diagnosis Multiple community resource/ social work needs:  CCM CSW active Medication management in setting of cognitive decline: CCM Pharmacy team active Lack of focus/ ability to convey thought patterns in clear manner  RNCM Clinical Goal(s):  Patient Janet continue to work with Consulting civil engineer to address care management and care coordination needs related to HTN, DMII, and cognitive decline  through collaboration with Consulting civil engineer, provider, and care team.   Interventions: 1:1 collaboration with primary care provider regarding development and update of comprehensive plan of care as evidenced by provider attestation and co-signature Inter-disciplinary care team collaboration (see longitudinal plan of care) Evaluation of current treatment plan related to  self management and patient's adherence to plan as established by provider  05/09/21- Acute/ Unexpected call:  Patient reports she is calling me because her home health team signed off and did not tell her they were no longer going to be coming to visit her; explained temporary nature of home health services and that usually if "sign off" occurs, it is because patient has met goals/ progressed through home health plan of care; reassured patient and encouraged her to contact [facility] if she had additional questions Patient is also asking about "the lady at bariatric surgery place;" states she has not heard from anyone at the bariatric surgeon center; patient states that Dr. Quay Burow placed  referral for bariatric surgery-- verified referral was placed 04/05/21; upon further questioning, patient states she "spoke to someone at the hospital" about bariatric surgical options-- she is asking who she spoke to-- I am unable to to find anyone in the  EHR that meets patient's description; patient reports she has the business card of the person she spoke to-- I encouraged her to locate business card, call number, or call me for assistance once she has business card to look at-- she will do; provided number for [facility]CS in case patient wishes to call- see that patient has also contacted Dr. Quay Burow' office staff to discuss  Reviewed recent PCP office visit 04/21/21 with patient: confirmed she obtained flu vaccine for 2022-23 flu/ winter season; confirmed no post-visit questions/ medication changes Confirmed A1-C of 5.4-- positive reinforcement provided with encouragement to continue efforts, reports blood sugars "78-100," confirms she monitors at various times of day- sounds like she is not recording blood sugars at home: encouraged to begin doing so, she Janet "try" Discussed referrals placed by Dr. Quay Burow 04/21/21- mammogram, bone density, diabetic shoes Reviewed yesterday's podiatry office visit: reports she Janet not be able to obtain diabetic shoes until "January of 2023" Confirmed no upcoming scheduled provider appointments Confirmed with patient my next scheduled telephone visit 06/20/21   From 03/20/21 CCM RN CM Telephone Visit:  Diabetes:  (Status: Goal on track: YES.) Lab Results  Component Value Date   HGBA1C 5.7 03/10/2019  Assessed patient's understanding of A1c goal:  patient remains unable to verbalize; verified no recent A1-C results (5.7-- 03/10/2019) Provided education to patient about basic DM disease process; Reviewed prescribed diet with patient carb modified, low sugar, heart healthy, low salt; Counseled on importance of regular laboratory monitoring as prescribed;        Discussed  plans with patient for ongoing care management follow up and provided patient with direct contact information for care management team;      Review of patient status, including review of consultants reports, relevant laboratory and other test results, and medications completed;       Discussed with patient blood sugar monitoring at home: she previously reported she obtained glucometer from CCM pharmacist- but was unable to see numbers on screen, states Mendel Ryder has since told her that she can not order a new glucometer because patient had already opened package.  Today, she tells me that she has not been using her glucometer due to not being able to see screen and also reports "needs new batteries;" encouraged her to obtain new batteries and begin using, at least once daily for fasting blood sugars Confirmed patient has not yet started monitoring blood sugars at home, as above Discussed with patient newly ordered home health services: she confirms home health team visiting at home, but she is unable to share specific details around same: states CSW and PT visited but is  not sure if they are coming back; states home health team was to reach out to Dr. Quay Burow about compression stockings she needs for bilateral LE's and "swelling arm;" she cannot tell me how long this swelling has been going on, just states that she needs Dr. Quay Burow to order stockings from "[employer]" so that her insurance company Janet cover the cost- I Janet message Dr. Quay Burow of patient's request; encouraged patient to consider making PCP appointment if this is a new issue; states she will do Confirms that she has not yet heard from [facility], still on waiting list per her report Confirms not following any specific diet: states her daughter occasionally brings her food and states today that she is also preparing her own meals: we discussed the value of following heart healthy, low salt, low carb/ low sugar diet  Hypertension: (Status:  New goal.) Last practice recorded BP readings:  BP Readings from Last 3 Encounters:  02/02/21 106/60  01/13/21 136/76  01/03/21 (!) 142/76  Most recent eGFR/CrCl:  Lab Results  Component Value Date   EGFR 82 (L) 02/28/2017    No components found for: CRCL  Evaluation of current treatment plan related to hypertension self management and patient's adherence to plan as established by provider;   Discussed plans with patient for ongoing care management follow up and provided patient with direct contact information for care management team; Discussed complications of poorly controlled blood pressure such as heart disease, stroke, circulatory complications, vision complications, kidney impairment, sexual dysfunction;  Confirmed patient does not monitor blood pressures at home- reports takes medications as prescribed Denies clinical concerns today and sounds to be in no distress, however, she continues to lack focus; states her daughter helps her with managing her health care needs; today, she denies new/ recent falls, however, she reported multiple falls without serious injury over last few months- previously provided education around fall prevention reiterated; this visit patient reports consistently using cane Reviewed recent neurological office visit with patient 02/24/21: she is unable to definitively tell me if she is taking recently prescribed rivastigmine, but she denies medication concerns and tells me she has all of her medications and is taking as prescribed Reviewed diet as above No upcoming provider appointments noted Made patient aware that CCM CSW has been trying to contact her by phone for follow up; patient does not remember CCM CSW, and thinks I am talking about home health CSW- Janet copy chart to CCM CSW as FYI Patient confirms she has not been driving as recommended by neurology provider  Patient Goals/Self-Care Activities: Patient Janet self administer medications as  prescribed Patient Janet attend all scheduled provider appointments Patient Janet call pharmacy for medication refills Patient Janet call provider office for new concerns or questions Patient Janet maintain communication with CCM team: CSW, care guide, pharmacy, and RN CM Patient Janet continue to use transportation services through [facility] Patient Janet make appointment with PCP to discuss upper extremity (arm) swelling Patient Janet obtain a new battery for her glucometer and Janet begin monitoring blood sugar at home Patient Janet follow heart healthy, low salt, low carbohydrate, low sugar diet Patient Janet continue to work with home health team, currently involved in her care      Plan: Telephone follow up appointment with care management team member scheduled for: June 20, 2021 The patient has been provided with contact information for the care management team and has been advised to call with any health related questions or concerns  [name], RN, BSN, [facility] RN Care Coordination- [place] [phone]: direct office [phone]: mobile

## 2021-05-10 ENCOUNTER — Other Ambulatory Visit: Payer: Self-pay | Admitting: Internal Medicine

## 2021-05-10 DIAGNOSIS — Z1231 Encounter for screening mammogram for malignant neoplasm of breast: Secondary | ICD-10-CM

## 2021-05-15 ENCOUNTER — Other Ambulatory Visit: Payer: Self-pay | Admitting: Internal Medicine

## 2021-05-23 ENCOUNTER — Other Ambulatory Visit: Payer: Self-pay

## 2021-05-23 ENCOUNTER — Telehealth: Payer: Self-pay | Admitting: Neurology

## 2021-05-23 MED ORDER — RIVASTIGMINE TARTRATE 1.5 MG PO CAPS: ORAL_CAPSULE | ORAL | 1 refills | Status: DC

## 2021-05-23 NOTE — Telephone Encounter (Signed)
Robert with [facility] needs to know if jaffe is going to keep pt on Rivastigmine 1.5mg . they are packaging her medicine and if he is, he needs a refill on it.

## 2021-05-23 NOTE — Telephone Encounter (Signed)
New script sent to [facility]

## 2021-05-24 DIAGNOSIS — I10 Essential (primary) hypertension: Secondary | ICD-10-CM | POA: Diagnosis not present

## 2021-05-24 DIAGNOSIS — E1169 Type 2 diabetes mellitus with other specified complication: Secondary | ICD-10-CM | POA: Diagnosis not present

## 2021-06-08 ENCOUNTER — Other Ambulatory Visit: Payer: Self-pay | Admitting: Internal Medicine

## 2021-06-08 DIAGNOSIS — M542 Cervicalgia: Secondary | ICD-10-CM

## 2021-06-08 NOTE — Telephone Encounter (Signed)
none

## 2021-06-12 ENCOUNTER — Ambulatory Visit: Payer: Medicare (Managed Care)

## 2021-06-20 ENCOUNTER — Telehealth: Payer: Medicare (Managed Care)

## 2021-06-20 ENCOUNTER — Encounter: Payer: Self-pay | Admitting: *Deleted

## 2021-06-20 ENCOUNTER — Telehealth: Payer: Self-pay | Admitting: *Deleted

## 2021-06-20 ENCOUNTER — Ambulatory Visit (INDEPENDENT_AMBULATORY_CARE_PROVIDER_SITE_OTHER): Payer: Medicare (Managed Care) | Admitting: *Deleted

## 2021-06-20 DIAGNOSIS — G3183 Dementia with Lewy bodies: Secondary | ICD-10-CM

## 2021-06-20 DIAGNOSIS — E119 Type 2 diabetes mellitus without complications: Secondary | ICD-10-CM

## 2021-06-20 DIAGNOSIS — I10 Essential (primary) hypertension: Secondary | ICD-10-CM

## 2021-06-20 NOTE — Telephone Encounter (Signed)
°  Chronic Care Management   Follow Up Note   06/20/2021 Name: Janet Le MRN: [id number] DOB: August 07, 1955  Referred by: Binnie Rail, MD Reason for referral : Chronic Care Management (CCM RN CM Follow Up Telephone Visit- Unsuccessful attempt)  An unsuccessful telephone outreach was attempted today. The patient was referred to the case management team for assistance with care management and care coordination.   Attempted scheduled telephone visit; Phone rang without physical or voicemail pick up- received automated outgoing message stating that patient has voice mail box that has not been set up; unable to leave patient voice mail message requesting call back  Follow Up Plan:  Will place request with scheduling care guide to contact patient to re-schedule today's missed CCM RN follow up telephone appointment if I do not hear back from patient by end of day  [name], RN, BSN, [place] [phone]: direct office

## 2021-06-20 NOTE — Telephone Encounter (Signed)
°  Care Management   Follow Up Note   06/20/2021 Name: Janet Le MRN: [id number] DOB: Jul 18, 1955   Referred by: Binnie Rail, MD Reason for referral : No chief complaint on file.   Third unsuccessful telephone outreach was attempted today. The patient was referred to the case management team for assistance with care management and care coordination. The patient's primary care provider has been notified of our unsuccessful attempts to make or maintain contact with the patient. The care management team is pleased to engage with this patient at any time in the future should he/she be interested in assistance from the care management team.   Follow Up Plan: We have been unable to make contact with the patient for follow up. The care management team is available to follow up with the patient after provider conversation with the patient regarding recommendation for care management engagement and subsequent re-referral to the care management team.   Eduard Clos MSW, LCSW Licensed Clinical Social Worker [place] [phone]

## 2021-06-20 NOTE — Progress Notes (Signed)
°  Care Management   Follow Up Note   06/20/2021 Name: Janet Le NO MRN: [id number] DOB: 30-Sep-1955   Referred by: Binnie Rail, MD Reason for referral : No chief complaint on file.   Third unsuccessful telephone outreach was attempted today. The patient was referred to the case management team for assistance with care management and care coordination. The patient's primary care provider has been notified of our unsuccessful attempts to make or maintain contact with the patient. The care management team is pleased to engage with this patient at any time in the future should he/she be interested in assistance from the care management team.   Follow Up Plan: If patient returns call to provider office, please call and/or re-consult CSW Eduard Clos MSW, LCSW Licensed Clinical Social Worker [place] [phone]

## 2021-06-22 ENCOUNTER — Telehealth: Payer: Self-pay | Admitting: *Deleted

## 2021-06-22 NOTE — Chronic Care Management (AMB) (Signed)
°  Care Management   Note  06/22/2021 Name: Janet Le MRN: [id number] DOB: 07-19-1955  Janet Le is a 65 y.o. year old female who is a primary care patient of Quay Burow, Claudina Lick, MD and is actively engaged with the care management team. I reached out to [name] by phone today to assist with re-scheduling a follow up visit with the RN Case Manager  Follow up plan: Unsuccessful telephone outreach attempt made. The care management team will reach out to the patient again over the next 7 days.  If patient returns call to provider office, please advise to call [employer] at [phone].  [facility]  Direct Dial: [phone]

## 2021-06-24 DIAGNOSIS — I10 Essential (primary) hypertension: Secondary | ICD-10-CM

## 2021-06-24 DIAGNOSIS — E119 Type 2 diabetes mellitus without complications: Secondary | ICD-10-CM

## 2021-06-29 NOTE — Chronic Care Management (AMB) (Signed)
°  Care Management   Note  06/29/2021 Name: Janet Le MRN: [id number] DOB: 02/01/1956  Janet Le is a 66 y.o. year old female who is a primary care patient of Quay Burow, Claudina Lick, MD and is actively engaged with the care management team. I reached out to [name] by phone today to assist with re-scheduling a follow up visit with the RN Case Manager  Follow up plan: Telephone appointment with care management team member scheduled for:07/13/21  [facility] Management  Direct Dial: [phone]

## 2021-07-02 ENCOUNTER — Encounter: Payer: Self-pay | Admitting: Internal Medicine

## 2021-07-02 NOTE — Progress Notes (Signed)
Subjective:    Patient ID: Janet Le, female    DOB: [birth year], 66 y.o.   MRN: [id number]  This visit occurred during the SARS-CoV-2 public health emergency.  Safety protocols were in place, including screening questions prior to the visit, additional usage of staff PPE, and extensive cleaning of exam room while observing appropriate contact time as indicated for disinfecting solutions.     HPI The patient is here for follow up of their chronic medical problems, including htn, DM, hld, lewy body dementia, IBS w/ constipation.  Her daughter is here with her.  She fell 3 weeks ago and injured her right knee and right baby toe.  The knee no longer but is still a little swollen. The baby toes still hurts, but it is better.   Having urinary incontinence. She thinks she needs to use diapers.  She wondered about getting prescription for them.  Constipation - chronic.   She ran out of the linzess and ran out of the miralax.  She is not eating much and per her daughter is not eating very well.  She feels depressed and anxious.  She is taking he medication - she does not feel like her depression and anxiety are controlled.  She wonders about changing medication.  At 1 point she was following with a psychiatrist and her daughters feel she needs to see a psychiatrist and maybe a therapist.   Medications and allergies reviewed with patient and updated if appropriate.  Patient Active Problem List   Diagnosis Date Noted   Urinary incontinence 07/03/2021   Breast asymmetry 04/08/2021   Venous insufficiency of both lower extremities 04/06/2021   Mild neurocognitive disorder with Lewy bodies 01/25/2021   Cervical radiculopathy 09/07/2020   Visual hallucinations    Attention deficit hyperactivity disorder 06/06/2020   Glaucoma 06/06/2020   Blepharitis 11/07/2019   Type 2 diabetes mellitus without complication, without long-term current use of insulin 10/06/2019   Hypertensive  retinopathy of both eyes 10/06/2019   Iridocyclitis 10/06/2019   Lattice degeneration of both retinas 10/06/2019   Pseudophakia of both eyes 10/06/2019   Nocturnal hypoxemia 05/11/2019   Dyspnea on exertion 05/11/2019   Excessive daytime sleepiness 05/11/2019   Primary osteoarthritis involving multiple joints 03/10/2019   Post traumatic stress disorder (PTSD)    Laryngopharyngeal reflux (LPR) 07/03/2018   Bilateral leg edema 06/12/2018   Degeneration of lumbar intervertebral disc 01/09/2018   Spondylolisthesis of lumbar region 01/09/2018   Fatigue 11/14/2017   Arthralgia of hip 11/14/2017   Major depressive disorder 10/08/2017   Generalized anxiety disorder 10/08/2017   Neuropathy 10/07/2017   LVH (left ventricular hypertrophy), mild 05/18/2017   S/P knee surgery 10/30/2016   Chronic low back pain 07/18/2016   Cervicalgia 05/30/2016   Neck pain 05/30/2016   Osteopenia 11/18/2015   Thrombocytopenia 05/30/2015   Pain in left knee 09/08/2014   Gout 12/11/2013   Morbid obesity 11/06/2013   Left lumbar radiculopathy 04/13/2012   Renal cell carcinoma (HCC)    IBS (irritable bowel syndrome) with chronic constipation 11/14/2010   GERD (gastroesophageal reflux disease) 11/14/2010   Hyperlipidemia 03/16/2010   Allergic rhinitis 08/31/2009   Syncope and collapse 05/16/2009   Essential hypertension 05/06/2009    Current Outpatient Medications on File Prior to Visit  Medication Sig Dispense Refill   Accu-Chek FastClix Lancets MISC USE AS DIRECTED UP  TO  1 TIME  DAILY- DX CODE R73.03 100 each 1   acetaminophen (TYLENOL) 325 MG tablet Take  1,300 mg by mouth at bedtime.     amLODipine (NORVASC) 5 MG tablet TAKE ONE TABLET BY MOUTH ONCE DAILY 90 tablet 0   atorvastatin (LIPITOR) 10 MG tablet TAKE ONE TABLET BY MOUTH ONCE DAILY 90 tablet 0   Blood Glucose Monitoring Suppl (ACCU-CHEK GUIDE ME) w/Device KIT Use to check blood sugar daily 1 kit 0   calcium carbonate (OS-CAL) 600 MG TABS  tablet Take 600 mg by mouth 2 (two) times daily with a meal.     clotrimazole (LOTRIMIN) 1 % cream SMARTSIG:1 Topical Daily PRN     dicyclomine (BENTYL) 10 MG capsule TAKE 1 TO 2 CAPSULES BY MOUTH THREE TIMES DAILY AS NEEDED 150 capsule 3   DULoxetine (CYMBALTA) 60 MG capsule TAKE ONE CAPSULE BY MOUTH ONCE DAILY 30 capsule 1   Elastic Bandages & Supports (MEDICAL COMPRESSION SOCKS) MISC Compression sock, medium compression 20-30 mmHg. Use daily for venous insufficiency T59.7 3 each 0   folic acid (FOLVITE) 1 MG tablet Take 1 tablet (1 mg total) by mouth daily. 90 tablet 3   furosemide (LASIX) 20 MG tablet TAKE ONE TABLET BY MOUTH ONCE DAILY 90 tablet 0   glucose blood (ACCU-CHEK GUIDE) test strip Use to check sugars once daily. Dx Code-R73.03 100 each 1   methotrexate 2.5 MG tablet Take 10 mg by mouth once a week. Mondays     Misc Natural Products (HEALTHY LIVER PO) Take by mouth.     Multiple Vitamin (MULTIVITAMIN) tablet Take 1 tablet by mouth daily.     polyethylene glycol powder (GLYCOLAX/MIRALAX) 17 GM/SCOOP powder Take 1 Container by mouth once.     Probiotic Product (PROBIOTIC-10 PO) Take 6 Billion Cells by mouth.     propranolol (INDERAL) 80 MG tablet TAKE ONE TABLET BY MOUTH TWICE DAILY 180 tablet 0   pyridOXINE (VITAMIN B-6) 50 MG tablet Take 1 tablet (50 mg total) by mouth daily. 30 tablet 8   rivastigmine (EXELON) 1.5 MG capsule Take 2 tablets 3 mg twice daily. 120 capsule 1   rivastigmine (EXELON) 3 MG capsule Take 3 mg by mouth 2 (two) times daily.     telmisartan (MICARDIS) 80 MG tablet TAKE ONE TABLET BY MOUTH ONCE DAILY 90 tablet 0   No current facility-administered medications on file prior to visit.    Past Medical History:  Diagnosis Date   Allergic rhinitis 08/31/2009   Arthralgia of hip 11/14/2017   Attention deficit hyperactivity disorder 06/06/2020   diagnosed as adult w/o formal testing   Bilateral leg edema 06/12/2018   Blepharitis 11/07/2019   Blood dyscrasia     platelets low in past   Cataract    Cervical radiculopathy 09/07/2020   Cervicalgia 05/30/2016   Chronic low back pain 07/18/2016   Degeneration of lumbar intervertebral disc 01/09/2018   Dyspnea on exertion 05/11/2019   Endometriosis    Esophageal stricture    Essential hypertension 05/06/2009   Excessive daytime sleepiness 05/11/2019   External hemorrhoids 08/28/2010   Fatigue 11/14/2017   Generalized anxiety disorder 10/08/2017   GERD (gastroesophageal reflux disease)    Glaucoma 06/06/2020   Gout 12/11/2013   Hiatal hernia    Hypertensive retinopathy of both eyes 10/06/2019   IBS (irritable bowel syndrome) with chronic constipation 11/14/2010   Iridocyclitis 10/06/2019   Laryngopharyngeal reflux (LPR) 07/03/2018   Lattice degeneration of both retinas 10/06/2019   Left lumbar radiculopathy 04/13/2012   LVH (left ventricular hypertrophy), mild 05/18/2017   Echo 04/2017 - mild LVH, normal EF, Grade  1 DD   Lymphedema    Major depressive disorder 10/08/2017   Migraine 06/14/2009   Mild neurocognitive disorder with Lewy bodies 01/25/2021   Neuropathy 10/07/2017   Nocturnal hypoxemia 05/11/2019   Osteoarthritis    Osteopenia 11/18/2015   06/10/2018: LFN -1.1, left radius 0.7, low FRAX.  No significant change from 2017  dexa 10/2015: t score  Spine -1.3, dual femur -0.9  Took alendronate 2014-2019   Post traumatic stress disorder (PTSD)    Primary osteoarthritis involving multiple joints 03/10/2019   Bilateral hips, knees   Pseudophakia of both eyes 10/06/2019   Pure hypercholesterolemia 03/16/2010   Renal cell carcinoma    R cryoablation by IR 02/16/11, Dr. Kathlene Cote  Followed by Dr. Janee Morn  No evidence of residual disease on CT 12/13 and 12/14   Rib pain on left side 01/03/2021   S/P knee surgery 10/30/2016   Spondylolisthesis of lumbar region 01/09/2018   Syncope and collapse 05/16/2009   since childhood; associated with extreme heat   TB lung, latent 11/25/2019    Thrombocytopenia 05/30/2015   Saw hem 02/2017 - mild, chronic - advised to stop protonix, no concerning cause, plan for pcp to monitor   Tinnitus of right ear 07/03/2018   Type 2 diabetes mellitus without complication, without long-term current use of insulin 10/06/2019   Visual hallucinations    Vulvar itching 01/03/2021    Past Surgical History:  Procedure Laterality Date   BREAST CYST EXCISION Bilateral over 10 years ago   No visable scar    CATARACT EXTRACTION, BILATERAL Bilateral 2020   COLONOSCOPY     fallopian tube removed     FUNCTIONAL ENDOSCOPIC SINUS SURGERY     HEMORRHOID BANDING     LASER ABLATION OF THE CERVIX     NASAL SINUS SURGERY     RENAL CRYOABLATION  02/16/11   R kidney due to [place] (IR procedure)   TOTAL KNEE ARTHROPLASTY Right 10/30/2016   Procedure: RIGHT TOTAL KNEE ARTHROPLASTY;  Surgeon: Meredith Pel, MD;  Location: [facility];  Service: Orthopedics;  Laterality: Right;   TUBAL LIGATION     UMBILICAL HERNIA REPAIR      Social History   Socioeconomic History   Marital status: Legally Separated    Spouse name: Not on file   Number of children: 4   Years of education: 12   Highest education level: High school graduate  Occupational History   Occupation: Disabled  Tobacco Use   Smoking status: Never   Smokeless tobacco: Never  Vaping Use   Vaping Use: Never used  Substance and Sexual Activity   Alcohol use: No    Comment: rare   Drug use: No   Sexual activity: Never  Other Topics Concern   Not on file  Social History Narrative   Right handed   Social Determinants of Health   Financial Resource Strain: Not on file  Food Insecurity: Not on file  Transportation Needs: Not on file  Physical Activity: Not on file  Stress: Not on file  Social Connections: Not on file    Family History  Problem Relation Age of Onset   Diabetes Mother    Hypertension Mother    Hyperlipidemia Mother    Heart disease Mother    Stroke Mother    Kidney  disease Mother    Thyroid disease Mother    Sleep apnea Mother    Obesity Mother    Memory loss Mother    Kidney disease Father  Prostate cancer Father    Alcoholism Father    Alcohol abuse Father    Multiple sclerosis Sister    Colitis Brother    Alcohol abuse Brother    Leukemia Brother    Alcohol abuse Daughter    Breast cancer Maternal Aunt    Breast cancer Cousin    Colon polyps Other    Colon cancer Neg Hx    Esophageal cancer Neg Hx    Pancreatic cancer Neg Hx    Rectal cancer Neg Hx    Stomach cancer Neg Hx     Review of Systems  Constitutional:  Negative for chills and fever.  Respiratory:  Positive for cough (occ). Negative for shortness of breath and wheezing.   Cardiovascular:  Positive for leg swelling (chronic - stable). Negative for chest pain and palpitations.  Neurological:  Positive for headaches (occ). Negative for light-headedness.  Psychiatric/Behavioral:  Positive for dysphoric mood and sleep disturbance. The patient is nervous/anxious.       Objective:   Vitals:   07/03/21 1417  BP: 134/78  Pulse: 83  Temp: 98.1 F (36.7 C)  SpO2: 98%   BP Readings from Last 3 Encounters:  07/03/21 134/78  04/21/21 128/82  02/24/21 115/70   Wt Readings from Last 3 Encounters:  07/03/21 224 lb (101.6 kg)  04/21/21 233 lb (105.7 kg)  02/24/21 228 lb (103.4 kg)   Body mass index is 40.97 kg/m.   Physical Exam    Constitutional: Appears well-developed and well-nourished. No distress.  HENT:  Head: Normocephalic and atraumatic.  Neck: Neck supple. No tracheal deviation present. No thyromegaly present.  No cervical lymphadenopathy Cardiovascular: Normal rate, regular rhythm and normal heart sounds.   No murmur heard. No carotid bruit .  No edema Pulmonary/Chest: Effort normal and breath sounds normal. No respiratory distress. No has no wheezes. No rales. Musculoskeletal: Left foot without any deformities, swelling and no tenderness to the left fifth  toe.  Normal sensation left foot Skin: Skin is warm and dry. Not diaphoretic.  Psychiatric: Normal mood and affect. Behavior is normal.      Assessment & Plan:   Handicap sticker filled out for her daughter-she understands that she cannot drive and her daughters both understand that she is not allowed to drive.  See Problem List for Assessment and Plan of chronic medical problems.

## 2021-07-02 NOTE — Patient Instructions (Addendum)
° ° °  Blood work was ordered.     Medications changes include :   take the linzess 145 mcg daily.   Your prescription(s) have been submitted to your pharmacy. Please take as directed and contact our office if you believe you are having problem(s) with the medication(s).   A referral was ordered for nutrition.        Someone from their office will call you to schedule an appointment.    Please followup in 6 months    [place]  [address] [phone]  [facility] [place] [phone] Office  [facility]            [facility] [address] [place] [phone]

## 2021-07-03 ENCOUNTER — Other Ambulatory Visit: Payer: Self-pay

## 2021-07-03 ENCOUNTER — Ambulatory Visit (INDEPENDENT_AMBULATORY_CARE_PROVIDER_SITE_OTHER): Payer: 59 | Admitting: Internal Medicine

## 2021-07-03 VITALS — BP 134/78 | HR 83 | Temp 98.1°F | Ht 62.0 in | Wt 224.0 lb

## 2021-07-03 DIAGNOSIS — E119 Type 2 diabetes mellitus without complications: Secondary | ICD-10-CM

## 2021-07-03 DIAGNOSIS — R32 Unspecified urinary incontinence: Secondary | ICD-10-CM

## 2021-07-03 DIAGNOSIS — G3183 Dementia with Lewy bodies: Secondary | ICD-10-CM

## 2021-07-03 DIAGNOSIS — I10 Essential (primary) hypertension: Secondary | ICD-10-CM | POA: Diagnosis not present

## 2021-07-03 DIAGNOSIS — R600 Localized edema: Secondary | ICD-10-CM

## 2021-07-03 DIAGNOSIS — E782 Mixed hyperlipidemia: Secondary | ICD-10-CM

## 2021-07-03 DIAGNOSIS — F02A0 Dementia in other diseases classified elsewhere, mild, without behavioral disturbance, psychotic disturbance, mood disturbance, and anxiety: Secondary | ICD-10-CM

## 2021-07-03 DIAGNOSIS — K581 Irritable bowel syndrome with constipation: Secondary | ICD-10-CM

## 2021-07-03 HISTORY — DX: Unspecified urinary incontinence: R32

## 2021-07-03 LAB — COMPREHENSIVE METABOLIC PANEL
ALT: 13 U/L (ref 0–35)
AST: 19 U/L (ref 0–37)
Albumin: 3.9 g/dL (ref 3.5–5.2)
Alkaline Phosphatase: 96 U/L (ref 39–117)
BUN: 12 mg/dL (ref 6–23)
CO2: 27 mEq/L (ref 19–32)
Calcium: 10 mg/dL (ref 8.4–10.5)
Chloride: 107 mEq/L (ref 96–112)
Creatinine, Ser: 0.91 mg/dL (ref 0.40–1.20)
GFR: 66 mL/min (ref >60.00)
Glucose, Bld: 89 mg/dL (ref 70–99)
Potassium: 3.8 mEq/L (ref 3.5–5.1)
Sodium: 140 mEq/L (ref 135–145)
Total Bilirubin: 0.4 mg/dL (ref 0.2–1.2)
Total Protein: 7.1 g/dL (ref 6.0–8.3)

## 2021-07-03 LAB — LIPID PANEL
Cholesterol: 168 mg/dL (ref 0–200)
HDL: 59.8 mg/dL (ref >39.00)
LDL Cholesterol: 94 mg/dL (ref 0–99)
NonHDL: 107.7
Total CHOL/HDL Ratio: 3
Triglycerides: 71 mg/dL (ref 0.0–149.0)
VLDL: 14.2 mg/dL (ref 0.0–40.0)

## 2021-07-03 LAB — CBC WITH DIFFERENTIAL/PLATELET
Basophils Absolute: 0 10*3/uL (ref 0.0–0.1)
Basophils Relative: 0.3 % (ref 0.0–3.0)
Eosinophils Absolute: 0.1 10*3/uL (ref 0.0–0.7)
Eosinophils Relative: 0.8 % (ref 0.0–5.0)
HCT: 38.6 % (ref 36.0–46.0)
Hemoglobin: 12.6 g/dL (ref 12.0–15.0)
Lymphocytes Relative: 21.3 % (ref 12.0–46.0)
Lymphs Abs: 1.7 10*3/uL (ref 0.7–4.0)
MCHC: 32.5 g/dL (ref 30.0–36.0)
MCV: 101.5 fl — ABNORMAL HIGH (ref 78.0–100.0)
Monocytes Absolute: 0.5 10*3/uL (ref 0.1–1.0)
Monocytes Relative: 6.7 % (ref 3.0–12.0)
Neutro Abs: 5.7 10*3/uL (ref 1.4–7.7)
Neutrophils Relative %: 70.9 % (ref 43.0–77.0)
Platelets: 125 10*3/uL — ABNORMAL LOW (ref 150.0–400.0)
RBC: 3.8 Mil/uL — ABNORMAL LOW (ref 3.87–5.11)
RDW: 13.1 % (ref 11.5–15.5)
WBC: 8.1 10*3/uL (ref 4.0–10.5)

## 2021-07-03 LAB — HEMOGLOBIN A1C: Hgb A1c MFr Bld: 5.7 % (ref 4.6–6.5)

## 2021-07-03 MED ORDER — LINACLOTIDE 145 MCG PO CAPS: 145.0000 ug | ORAL_CAPSULE | Freq: Every day | ORAL | 3 refills | Status: DC

## 2021-07-03 MED ORDER — COMFORT PROTECT ADULT DIAPER/L MISC: 5 refills | Status: DC

## 2021-07-03 NOTE — Assessment & Plan Note (Signed)
Chronic Lab Results  Component Value Date   HGBA1C 5.4 04/21/2021   Sugars have been in the normal range with diet She is currently not eating healthy, which may partially be related to her attempting to lose weight, constipation and dementia She is interested in seeing nutrition and I will refer Discussed the importance of fruits, vegetables and lean protein-carbohydrates and sugars.  Discussed the importance of eating regularly

## 2021-07-03 NOTE — Assessment & Plan Note (Addendum)
Chronic BP well controlled Continue amlodipine 5 mg daily, propranolol 80 mg twice daily, telmisartan 80 mg daily cmp

## 2021-07-03 NOTE — Assessment & Plan Note (Signed)
Acute States increased urinary incontinence-she is not making it to the bathroom in time She feels she needs to start using some adult diapers ?  Covered by insurance or not Prescription sent for adult large diapers-we will guesstimate 3/day

## 2021-07-03 NOTE — Assessment & Plan Note (Signed)
Chronic Stable Continue Lasix 20 mg daily

## 2021-07-03 NOTE — Assessment & Plan Note (Signed)
Chronic Regular exercise and healthy diet encouraged Check lipid panel  Continue Lipitor 10 mg daily

## 2021-07-03 NOTE — Assessment & Plan Note (Signed)
Chronic Following with neurology-Dr. Tomi Likens

## 2021-07-03 NOTE — Assessment & Plan Note (Signed)
Chronic Has seen GI in the past for her chronic constipation/IBS Ran out of Linzess and MiraLAX At her last GI appointment it was recommended that she take Linzess on a daily basis I will refill her Linzess-advised her to take this on a daily basis Discussed dietary changes that may help as well

## 2021-07-04 ENCOUNTER — Telehealth: Payer: Self-pay | Admitting: Internal Medicine

## 2021-07-04 NOTE — Telephone Encounter (Signed)
1.Medication Requested: polyethylene glycol powder (GLYCOLAX/MIRALAX) 17 GM/SCOOP powder  2. Pharmacy (Name, Street, [facility]): [facility] - [place], [place] - [address]  Phone:  [phone] Fax:  [phone]   3. On Med List: yes  4. Last Visit with PCP: 01.09.23  5. Next visit date with PCP: 04.28.23   Agent: Please be advised that RX refills may take up to 3 business days. We ask that you follow-up with your pharmacy.

## 2021-07-05 MED ORDER — POLYETHYLENE GLYCOL 3350 17 GM/SCOOP PO POWD: 17.0000 g | Freq: Every day | ORAL | 11 refills | Status: DC | PRN

## 2021-07-05 NOTE — Telephone Encounter (Signed)
Patient checking status of refill request  Please advise  *see below*

## 2021-07-06 ENCOUNTER — Other Ambulatory Visit: Payer: 59

## 2021-07-12 ENCOUNTER — Other Ambulatory Visit: Payer: Self-pay | Admitting: Neurology

## 2021-07-13 ENCOUNTER — Ambulatory Visit (INDEPENDENT_AMBULATORY_CARE_PROVIDER_SITE_OTHER): Payer: Commercial Managed Care - HMO | Admitting: *Deleted

## 2021-07-13 DIAGNOSIS — F02A0 Dementia in other diseases classified elsewhere, mild, without behavioral disturbance, psychotic disturbance, mood disturbance, and anxiety: Secondary | ICD-10-CM

## 2021-07-13 DIAGNOSIS — E119 Type 2 diabetes mellitus without complications: Secondary | ICD-10-CM

## 2021-07-13 NOTE — Patient Instructions (Signed)
Visit Information  [facility]util and Janet Le, thank you for taking time to talk with me today. Please don't hesitate to contact me if I can be of assistance to you before our next scheduled telephone appointment.  Below are the goals we discussed today:  Patient Self-Care Activities: Patient Janet Le will: Take medications as prescribed- I am glad you are finding the blister packs for your medications helpful Attend all scheduled provider appointments Call pharmacy for medication refills Call provider office for new concerns or questions Maintain communication with CCM team: pharmacy, RN CM and Clinical Social Worker: I have asked the Clinical Social Worker to contact you to discuss options for Assisted Living and in- home care-- please listen for a call from the scheduling team-- they will schedule a phone call with the Clinical Social Worker: please write the appointment down on your calendar; think about your questions for the social worker, so you will be prepared for her call Please call the Diabetes/ [facility] to schedule an in-person visit to discuss nutrition: the number for the [facility] is: [phone]; Dr. Quay Burow placed this referral for you on 07/03/21 Follow heart healthy, low salt, low carbohydrate, low sugar diet Please take precautions to prevent falls-- use your cane and/ or walker as needed  Our next scheduled telephone follow up visit/ appointment with care management team member is scheduled on:  Wednesday, September 13, 2021 at 11:30 am  If you need to cancel or re-schedule our visit, please call [phone] and our care guide team will be happy to assist you.   I look forward to hearing about your progress.   [name], RN, BSN, [place] [phone]: direct office  If you are experiencing a Mental Health or [place] or need someone to talk to, please  call the Suicide and Crisis  Lifeline: 988 call the [place] National Suicide Prevention Lifeline: 414 620 5836 or TTY: (772) 860-1900 TTY (250)282-6409) to talk to a trained counselor call 1-800-273-TALK (toll free, 24 hour hotline) go to [facility] [address], [place] [phone]) call 911   The patient verbalized understanding of instructions, educational materials, and care plan provided today and agreed to receive a mailed copy of patient instructions, educational materials, and care plan: patient/ daughter requests AVS to be sent to daughter's home-- address for daughter Janet Le verified  Understanding Your Risk for Falls Each year, millions of people have serious injuries from falls. It is important to understand your risk for falling. Talk with your health care provider about your risk and what you can do to lower it. There are actions you can take at home to lower your risk and prevent falls. If you do have a serious fall, make sure to tell your health care provider. Falling once raises your risk of falling again. How can falls affect me? Serious injuries from falls are common. These include: Broken bones, such as hip fractures. Head injuries, such as traumatic brain injuries (TBI) or concussion. A fear of falling can cause you to avoid activities and stay at home. This can make your muscles weaker and actually raise your risk for a fall. What can increase my risk? There are a number of risk factors that increase your risk for falling. The more risk factors you have, the higher your risk of falling. Serious injuries from a fall happen most often to people older than age 55. Children and young adults ages 29-29 are also  at higher risk. Common risk factors include: Weakness in the lower body. Lack (deficiency) of vitamin D. Being generally weak or confused due to long-term (chronic) illness. Dizziness or balance problems. Poor vision. Medicines that cause dizziness or  drowsiness. These can include medicines for your blood pressure, heart, anxiety, insomnia, or edema, as well as pain medicines and muscle relaxants. Other risk factors include: Drinking alcohol. Having had a fall in the past. Having depression. Having foot pain or wearing improper footwear. Working at a dangerous job. Having any of the following in your home: Tripping hazards, such as floor clutter or loose rugs. Poor lighting. Pets. Having dementia or memory loss. What actions can I take to lower my risk of falling?   Physical activity Maintain physical fitness. Do strength and balance exercises. Consider taking a regular class to build strength and balance. Yoga and tai chi are good options. Vision Have your eyes checked every year and your vision prescription updated as needed. Walking aids and footwear Wear nonskid shoes. Do not wear high heels. Do not walk around the house in socks or slippers. Use a cane or walker as told by your health care provider. Home safety Attach secure railings on both sides of your stairs. Install grab bars for your tub, shower, and toilet. Use a bath mat in your tub or shower. Use good lighting in all rooms. Keep a flashlight near your bed. Make sure there is a clear path from your bed to the bathroom. Use night-lights. Do not use throw rugs. Make sure all carpeting is taped or tacked down securely. Remove all clutter from walkways and stairways, including extension cords. Repair uneven or broken steps. Avoid walking on icy or slippery surfaces. Walk on the grass instead of on icy or slick sidewalks. Use ice melt to get rid of ice on walkways. Use a cordless phone. Questions to ask your health care provider Can you help me check my risk for a fall? Do any of my medicines make me more likely to fall? Should I take a vitamin D supplement? What exercises can I do to improve my strength and balance? Should I make an appointment to have my vision  checked? Do I need a bone density test to check for weak bones or osteoporosis? Would it help to use a cane or a walker? Where to find more information Centers for Disease Control and Prevention, [employer]AD[employer]: http://www.wolf.info/ Community-Based Fall Prevention Programs: http://www.wolf.info/ National Institute on Aging: http://kim-miller.com/ Contact a health care provider if: You fall at home. You are afraid of falling at home. You feel weak, drowsy, or dizzy. Summary Serious injuries from a fall happen most often to people older than age 38. Children and young adults ages 37-29 are also at higher risk. Talk with your health care provider about your risks for falling and how to lower those risks. Taking certain precautions at home can lower your risk for falling. If you fall, always tell your health care provider. This information is not intended to replace advice given to you by your health care provider. Make sure you discuss any questions you have with your health care provider. Document Revised: 01/13/2020 Document Reviewed: 01/13/2020 Elsevier Patient Education  [employer].

## 2021-07-13 NOTE — Chronic Care Management (AMB) (Signed)
Chronic Care Management   CCM RN Visit Note  07/13/2021 Name: Janet Le MRN: [id number] DOB: 03-18-1956  Subjective: Janet Le is a 66 y.o. year old female who is a primary care patient of Burns, Claudina Lick, MD. The care management team was consulted for assistance with disease management and care coordination needs.    Engaged with patient and her daughter/ caregiver Janet Le by telephone for follow up visit in response to provider referral for case management and/or care coordination services.   Patient provided verbal consent today for "anyone at Dr. Quay Burow' office to talk to Sherlyn Hay requests that she be contacted due to patient's memory issues/ cognitive decline, as well as patient's phone inconsistently working correctly; patient is currently staying at [facility] home, this may be temporary  Janet Le: [id number]; address: [address] [place], [place] [address]  Consent to Services:  The patient was given information about Chronic Care Management services, agreed to services, and gave verbal consent prior to initiation of services.  Please see initial visit note for detailed documentation.  Patient agreed to services and verbal consent obtained.   Assessment: Review of patient past medical history, allergies, medications, health status, including review of consultants reports, laboratory and other test data, was performed as part of comprehensive evaluation and provision of chronic care management services.   SDOH (Social Determinants of Health) assessments and interventions performed:  SDOH Interventions    Flowsheet Row Most Recent Value  SDOH Interventions   Food Insecurity Interventions Intervention Not Indicated  [Denies food insecurity while living with daughter in [place] temporarily: patient was previously interested in MOW/ food assistance: CSW re-referred today for multiple CSW needs, including possible ALF placement/ PCS services]  Transportation  Interventions Other (Comment)  [Patient and caregiver/ daughter Janet Le report patient currently living temporarily with daughter in [place], [place],  daughter currently providing transportation: this may be a need in the future]      CCM Care Plan  Allergies  Allergen Reactions   Sertraline Hcl Other (See Comments)    Spasms; numbness   Ace Inhibitors Cough   Codeine Palpitations   Darifenacin Hydrobromide Er Other (See Comments)    Pt states made her feel queezy & drunk   Gabapentin Other (See Comments)    Hallucinations   Pravastatin Other (See Comments)    myalgias   Propoxyphene Hcl Palpitations   Wellbutrin [Bupropion Hcl] Other (See Comments)    Numbness of mouth/lips   Outpatient Encounter Medications as of 07/13/2021  Medication Sig   Accu-Chek FastClix Lancets MISC USE AS DIRECTED UP  TO  1 TIME  DAILY- DX CODE R73.03   acetaminophen (TYLENOL) 325 MG tablet Take 1,300 mg by mouth at bedtime.   amLODipine (NORVASC) 5 MG tablet TAKE ONE TABLET BY MOUTH ONCE DAILY   atorvastatin (LIPITOR) 10 MG tablet TAKE ONE TABLET BY MOUTH ONCE DAILY   Blood Glucose Monitoring Suppl (ACCU-CHEK GUIDE ME) w/Device KIT Use to check blood sugar daily   calcium carbonate (OS-CAL) 600 MG TABS tablet Take 600 mg by mouth 2 (two) times daily with a meal.   clotrimazole (LOTRIMIN) 1 % cream SMARTSIG:1 Topical Daily PRN   dicyclomine (BENTYL) 10 MG capsule TAKE 1 TO 2 CAPSULES BY MOUTH THREE TIMES DAILY AS NEEDED   DULoxetine (CYMBALTA) 60 MG capsule TAKE ONE CAPSULE BY MOUTH ONCE DAILY   Elastic Bandages & Supports (MEDICAL COMPRESSION SOCKS) MISC Compression sock, medium compression 20-30 mmHg. Use daily for venous insufficiency V89.3   folic  acid (FOLVITE) 1 MG tablet Take 1 tablet (1 mg total) by mouth daily.   furosemide (LASIX) 20 MG tablet TAKE ONE TABLET BY MOUTH ONCE DAILY   glucose blood (ACCU-CHEK GUIDE) test strip Use to check sugars once daily. Dx Code-R73.03   Incontinence Supply  Disposable (COMFORT PROTECT ADULT DIAPER/L) MISC Use as directed for urinary incontinence   linaclotide (LINZESS) 145 MCG CAPS capsule Take 1 capsule (145 mcg total) by mouth daily.   methotrexate 2.5 MG tablet Take 10 mg by mouth once a week. Mondays   Misc Natural Products (HEALTHY LIVER PO) Take by mouth.   Multiple Vitamin (MULTIVITAMIN) tablet Take 1 tablet by mouth daily.   polyethylene glycol powder (GLYCOLAX/MIRALAX) 17 GM/SCOOP powder Take 17 g by mouth daily as needed for moderate constipation.   Probiotic Product (PROBIOTIC-10 PO) Take 6 Billion Cells by mouth.   propranolol (INDERAL) 80 MG tablet TAKE ONE TABLET BY MOUTH TWICE DAILY   pyridOXINE (VITAMIN B-6) 50 MG tablet Take 1 tablet (50 mg total) by mouth daily.   rivastigmine (EXELON) 3 MG capsule Take 3 mg by mouth 2 (two) times daily.   rivastigmine (EXELON) 3 MG capsule TAKE ONE CAPSULE BY MOUTH TWICE DAILY   telmisartan (MICARDIS) 80 MG tablet TAKE ONE TABLET BY MOUTH ONCE DAILY   No facility-administered encounter medications on file as of 07/13/2021.   Patient Active Problem List   Diagnosis Date Noted   Urinary incontinence 07/03/2021   Breast asymmetry 04/08/2021   Venous insufficiency of both lower extremities 04/06/2021   Mild neurocognitive disorder with Lewy bodies 01/25/2021   Cervical radiculopathy 09/07/2020   Visual hallucinations    Attention deficit hyperactivity disorder 06/06/2020   Glaucoma 06/06/2020   Blepharitis 11/07/2019   Type 2 diabetes mellitus without complication, without long-term current use of insulin 10/06/2019   Hypertensive retinopathy of both eyes 10/06/2019   Iridocyclitis 10/06/2019   Lattice degeneration of both retinas 10/06/2019   Pseudophakia of both eyes 10/06/2019   Nocturnal hypoxemia 05/11/2019   Dyspnea on exertion 05/11/2019   Excessive daytime sleepiness 05/11/2019   Primary osteoarthritis involving multiple joints 03/10/2019   Post traumatic stress disorder (PTSD)     Laryngopharyngeal reflux (LPR) 07/03/2018   Bilateral leg edema 06/12/2018   Degeneration of lumbar intervertebral disc 01/09/2018   Spondylolisthesis of lumbar region 01/09/2018   Fatigue 11/14/2017   Arthralgia of hip 11/14/2017   Major depressive disorder 10/08/2017   Generalized anxiety disorder 10/08/2017   Neuropathy 10/07/2017   LVH (left ventricular hypertrophy), mild 05/18/2017   S/P knee surgery 10/30/2016   Chronic low back pain 07/18/2016   Cervicalgia 05/30/2016   Neck pain 05/30/2016   Osteopenia 11/18/2015   Thrombocytopenia 05/30/2015   Pain in left knee 09/08/2014   Gout 12/11/2013   Morbid obesity 11/06/2013   Left lumbar radiculopathy 04/13/2012   Renal cell carcinoma (HCC)    IBS (irritable bowel syndrome) with chronic constipation 11/14/2010   GERD (gastroesophageal reflux disease) 11/14/2010   Hyperlipidemia 03/16/2010   Allergic rhinitis 08/31/2009   Syncope and collapse 05/16/2009   Essential hypertension 05/06/2009   Conditions to be addressed/monitored:  HTN, DMII, and cognitive decline with 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  Care Plan : RN Care Manager Plan of Care  Updates made by Knox Royalty, RN since 07/13/2021 12:00 AM     Problem: Chronic Disease Management Needs   Priority: Medium     Long-Range Goal: Ongoing adherence to established plan of care for long term chronic  disease management   Start Date: 02/09/2021  Expected End Date: 02/09/2022  Priority: Medium  Note:   Current Barriers:  Chronic Disease Management support and education needs related to HTN, DMII, and cognitive decline/ impairment Patient lives alone- reports limited ability to care for self in setting of progressive cognitive decline with recent Lewy Body diagnosis: 07/13/21: patient reports she continues to live alone; however, she is currently staying at her daughter's Janet Le") home in [place], [place]-- Janet Le participates in today's call while phone on speaker mode;  patient provides verbal consent today for "anyone at Dr. Eilleen Kempf to speak with daughter Janet Le.  Reports this daughter has been assisting her recently with follow up appointments and general daily health management.  Janet Le requests that she be contacted for follow up, as patient's phone "does not always work right;" and due to patient forgetting health needs easily.  She also requests that AVS be mailed to her home in [place], as patient is currently residing with her, albeit this may be temporary Daughter Janet Le phone number: [phone]; address: [address], [place], [place] [address] Multiple community resource/ social work needs:  CCM CSW active; 07/13/21: CCM CSW previously involved; signed off 06/20/21 due to inability to maintain contact with patient: today, patient/ daughter Janet Le report ongoing desire for information about obtaining personal care services (or other in-home care; patient unsure if she actually has active medicaid) and/ or beginning the process to transition to ALF vs. SNF-- will place another CCM CSW referral; encouraged patient and daughter to fully engage with CCM CSW team once telephone call appointment is scheduled; they verbalize understanding/ agreement Medication management in setting of cognitive decline: [facility] team active: 07/13/21: patient reports good adherence to medication regimen with use of blister packaging Lack of focus/ ability to convey thought patterns in clear manner-- recent diagnosis of cognitive decline/ Lewey bodies dementia  RNCM Clinical Goal(s):  Patient will continue to work with RN Care Manager to address care management and care coordination needs related to HTN, DMII, and cognitive decline  through collaboration with RN Care manager, provider, and care team.   Interventions: 1:1 collaboration with primary care provider regarding development and update of comprehensive plan of care as evidenced by provider attestation and  co-signature Inter-disciplinary care team collaboration (see longitudinal plan of care) Evaluation of current treatment plan related to  self management and patient's adherence to plan as established by provider 07/13/21: Reviewed recent PCP office visit with patient/ daughter/ caregiver: patient reports has not yet heard from [facility] to schedule for referral-- explained that the [facility] team has been trying to contact patient to schedule; patient/ daughter unaware; provided contact information for center and encouraged them to call/ schedule promptly; by time of chart signing, this has been completed and appointment is scheduled for August 30, 2021; confirmed patient obtained 2022-23 winter/ flu vaccine, as well as pneumonia vaccine 07/13/21: SDOH updated; no new concerns/ unmet needs identified; however, there remains ongoing social work needs pending, as above; current SDOH dependent on patient residing with daughter, which may change in the future (patient continues "technically" living by herself in [place]) 07/13/21: pain assessment updated: no acute or chronic pain reported today 07/13/21: Falls assessment updated: patient has had multiple falls x last 12 months; uses cane/ walker as indicated; caregiver/ daughter Janet Le and patient deny new/ recent falls since last outreach, however, they do acknowledge "one" near fall-- falls risks/ prevention education reinforced   Diabetes:  (Status: Goal on track: YES.)  Long-Term  goal Lab Results  Component Value Date   HGBA1C 5.7 07/03/2021  Provided education to patient about basic DM disease process; Reviewed prescribed diet with patient carb modified, low sugar, heart healthy, low salt; Counseled on importance of regular laboratory monitoring as prescribed;        Discussed plans with patient for ongoing care management follow up and provided patient with direct contact information for care management team;      Review of  patient status, including review of consultants reports, relevant laboratory and other test results, and medications completed;       Confirmed patient has still not yet started monitoring blood sugars at home: continues to report "blood sugar machine not working right;" given patient's recent A1-C < 6.0 and her ongoing cognitive issues-- this is reasonable Discussed/ provided education around signs/ symptoms low blood sugar along with corresponding action plan: patient denies recent signs/ symptoms of low blood sugars: she/ caregiver will require ongoing reinforcement Reviewed upcoming provider appointments: 07/14/21-- podiatry for Diabetic shoes castings-- daughter/ caregiver was unaware of this visit: she verbalizes plans to ensure that patient will attend as scheduled, tomorrow and confirms she will provide transportation; 09/07/21- neurology follow up visit Confirms not following any specific diet: states her daughter occasionally brings her food and states today that she is also preparing her own meals: we discussed the value of following heart healthy, low salt, low carb/ low sugar diet  Hypertension: (Status: Goal on Track (progressing): YES.) Long-Term goal Last practice recorded BP readings:  BP Readings from Last 3 Encounters:  07/03/21 134/78  04/21/21 128/82  02/24/21 115/70  Most recent eGFR/CrCl:  Lab Results  Component Value Date   EGFR 82 (L) 02/28/2017    No components found for: CRCL  Evaluation of current treatment plan related to hypertension self management and patient's adherence to plan as established by provider;   Discussed plans with patient for ongoing care management follow up and provided patient with direct contact information for care management team; Discussed complications of poorly controlled blood pressure such as heart disease, stroke, circulatory complications, vision complications, kidney impairment, sexual dysfunction;  Confirmed patient does not monitor  blood pressures at home- reports takes medications as prescribed-- states this is "much better" now that she has adherence/ compliance packaging for her medications Denies clinical concerns today and sounds to be in no distress, however, she continues to lack focus in conversation;   Patient Goals/Self-Care Activities: As evidenced by review of EHR, collaboration with care team, and patient reporting during CCM RN CM outreach,  Patient Dredge will: Take medications as prescribed- I am glad you are finding the blister packs for your medications helpful Attend all scheduled provider appointments Call pharmacy for medication refills Call provider office for new concerns or questions Maintain communication with CCM team: pharmacy, RN CM and Clinical Social Worker: I have asked the Clinical Social Worker to contact you to discuss options for Assisted Living and in- home care-- please listen for a call from the scheduling team-- they will schedule a phone call with the Clinical Social Worker: please write the appointment down on your calendar; think about your questions for the social worker, so you will be prepared for her call Please call the [facility]/ [facility] to schedule an in-person visit to discuss nutrition: the number for the [facility] is: [phone]; Dr. Quay Burow placed this referral for you on 07/03/21 Follow heart healthy, low salt, low carbohydrate, low sugar diet Please take precautions to prevent falls-- use  your cane and/ or walker as needed      Plan: Telephone follow up appointment with care management team member scheduled for:  Wednesday, September 13, 2021 at 11:30 am The patient has been provided with contact information for the care management team and has been advised to call with any health related questions or concerns.    [name], RN, BSN, [facility] RN Care Coordination- [place] [phone]: direct  office

## 2021-07-14 ENCOUNTER — Telehealth: Payer: Self-pay | Admitting: *Deleted

## 2021-07-14 ENCOUNTER — Telehealth: Payer: Self-pay | Admitting: Podiatry

## 2021-07-14 ENCOUNTER — Other Ambulatory Visit: Payer: Self-pay

## 2021-07-14 ENCOUNTER — Ambulatory Visit: Payer: 59

## 2021-07-14 DIAGNOSIS — E0843 Diabetes mellitus due to underlying condition with diabetic autonomic (poly)neuropathy: Secondary | ICD-10-CM

## 2021-07-14 NOTE — Chronic Care Management (AMB) (Signed)
°  Care Management   Note  07/14/2021 Name: Janet Le MRN: [id number] DOB: August 13, 1955  Janet Le is a 66 y.o. year old female who is a primary care patient of Quay Burow, Claudina Lick, MD and is actively engaged with the care management team. I reached out to [name] by phone today to assist with scheduling an initial visit with the Licensed Clinical Social Worker  Follow up plan: Telephone appointment with care management team member scheduled for:07/19/21  [facility] Management  Direct Dial: [phone]

## 2021-07-14 NOTE — Chronic Care Management (AMB) (Signed)
°  Care Management   Note  07/14/2021 Name: Janet Le MRN: [id number] DOB: [birth year]  Janet Le is a 66 y.o. year old female who is a primary care patient of Quay Burow, Claudina Lick, MD and is actively engaged with the care management team. I reached out to [name] by phone today to assist with scheduling an initial visit with the Licensed Clinical Social Worker  Follow up plan: Unsuccessful telephone outreach attempt made. A HIPAA compliant phone message was left for the patient providing contact information and requesting a return call.  The care management team will reach out to the patient again over the next 7 days.  If patient returns call to provider office, please advise to call Janet Le at [phone].  [facility] Management  Direct Dial: [phone]

## 2021-07-14 NOTE — Telephone Encounter (Signed)
Per Sam @ [employer] the diabetic shoes/inserts(A5500/A5513) as well as the orthotics (L3020)are valid and billable codes and no Josem Kaufmann is needed. Diabetic shoes and inserts are covered @ 100% and orthotics have a 10% co insurance. No deductible and out of pocket is 1050... reference number [id number].

## 2021-07-15 NOTE — Progress Notes (Signed)
SITUATION Reason for Consult: Evaluation for Prefabricated Diabetic Shoes and Bilateral Custom Diabetic Inserts. Patient / Caregiver Report: Patient would like well fitting shoes  OBJECTIVE DATA: Patient History / Diagnosis:    ICD-10-CM   1. Diabetes mellitus due to underlying condition with diabetic autonomic neuropathy, unspecified whether long term insulin use (HCC)  E08.43       Current or Previous Devices:   None and no history  In-Person Foot Examination: Ulcers & Callousing:   None  Toe / Foot Deformities:   - Pes Planus - Hammertoes   Shoe Size: 10XW  ORTHOTIC RECOMMENDATION Recommended Devices: - 1x pair prefabricated PDAC approved diabetic shoes: A3200W 10XW - 3x pair custom-to-patient vacuum formed diabetic insoles.   GOALS OF SHOES AND INSOLES - Reduce shear and pressure - Reduce / Prevent callus formation - Reduce / Prevent ulceration - Protect the fragile healing compromised diabetic foot.  Patient would benefit from diabetic shoes and inserts as patient has diabetes mellitus and the patient has one or more of the following conditions: - Peripheral neuropathy with evidence of callus formation - Foot deformity - Poor circulation  ACTIONS PERFORMED Patient was casted for insoles via crush box and measured for shoes via brannock device. Procedure was explained and patient tolerated procedure well. All questions were answered and concerns addressed.  PLAN Patient is to ensure treating physician receives and completes diabetic paperwork. Casts and shoe order are to be held until paperwork is received. Once received patient is to be scheduled for fitting in four weeks.

## 2021-07-18 ENCOUNTER — Ambulatory Visit: Payer: Medicare (Managed Care) | Admitting: Licensed Clinical Social Worker

## 2021-07-18 DIAGNOSIS — Z139 Encounter for screening, unspecified: Secondary | ICD-10-CM

## 2021-07-18 DIAGNOSIS — I10 Essential (primary) hypertension: Secondary | ICD-10-CM

## 2021-07-18 DIAGNOSIS — F411 Generalized anxiety disorder: Secondary | ICD-10-CM

## 2021-07-18 DIAGNOSIS — F02A0 Dementia in other diseases classified elsewhere, mild, without behavioral disturbance, psychotic disturbance, mood disturbance, and anxiety: Secondary | ICD-10-CM

## 2021-07-18 NOTE — Chronic Care Management (AMB) (Signed)
Chronic Care Management   Clinical Social Work Note  07/18/2021 Name: Janet Le MRN: [id number] DOB: Nov 21, 1955  Janet Le is a 66 y.o. year old female who is a primary care patient of Burns, Claudina Lick, MD. The CCM team was consulted to assist the patient with chronic disease management and/or care coordination needs related to: Transportation Needs , [employer] , and Level of Care Concerns.   Engaged with patient by telephone for initial visit in response to provider referral for social work chronic care management and care coordination services.   Consent to Services:  The patient was given the following information about Chronic Care Management services today, agreed to services, and gave verbal consent: 1. CCM service includes personalized support from designated clinical staff supervised by the primary care provider, including individualized plan of care and coordination with other care providers 2. 24/7 contact phone numbers for assistance for urgent and routine care needs. 3. Service will only be billed when office clinical staff spend 20 minutes or more in a month to coordinate care. 4. Only one practitioner may furnish and bill the service in a calendar month. 5.The patient may stop CCM services at any time (effective at the end of the month) by phone call to the office staff. 6. The patient will be responsible for cost sharing (co-pay) of up to 20% of the service fee (after annual deductible is met). Patient agreed to services and consent obtained.  Patient agreed to services and consent obtained.   Summary: Assessed patient's previous and current treatment, coping skills, support system and barriers to care. Patient was accompanied by her daughter Darrick Penna who provided information during this encounter.Patient is not interested in assisted living would like to move to senior apartments, does not have Medicaid so unable to apply for personal care services, discussed other  options for meeting ADL's, family will explore (applying for Medicaid). Main concern was transportation, meals on wheels and a new apartment.  Patient continues to temp live with her daughter in [place] and has not decided if she is relocating.  See Care Plan below for interventions and patient self-care actives.  Recommendation: Patient may benefit from, and is in agreement for Daughter to assist with exploring Long-term Care options in addition to senior apartments.   Follow up Plan: Patient's caregiver would like continued follow-up from CCM LCSW .  per caregiver's request CCM LCSW will follow up in 3 weeks.  They will call the office if needed prior to next encounter.   Assessment: Review of patient past medical history, allergies, medications, and health status, including review of relevant consultants reports was performed today as part of a comprehensive evaluation and provision of chronic care management and care coordination services.     SDOH (Social Determinants of Health) assessments and interventions performed:  SDOH Interventions    Flowsheet Row Most Recent Value  SDOH Interventions   SDOH Interventions for the Following Domains Food Insecurity, Housing  Food Insecurity Interventions Other (Comment)  [would like meals on wheels]  Housing Interventions Other (Comment)  [would like different housing]  Transportation Interventions Other (Comment)  [will call insurance provider to see if she has this service]        Advanced Directives Status: Not addressed in this encounter.  CCM Care Plan   Conditions to be addressed/monitored: Anxiety and Dementia; Level of care concerns  Care Plan : LCSW Plan of Care  Updates made by Maurine Cane, LCSW since 07/18/2021 12:00 AM  Problem: Long-Term Care Planning      Goal: Effective Long-Term Care Planning for health needs   Start Date: 07/18/2021  Expected End Date: 09/22/2021  This Visit's Progress: On track  Priority:  High  Note:   Current Barriers:  Level of Care Concerns:Inability to perform ADL's independently and Memory Deficits  CSW Clinical Goal(s):  Patient  and Caregiver  will work with agencies discussed and LCSW to address needs related to level of care  through collaboration with [employer] representative, provider, and care team.   Interventions: 1:1 collaboration with primary care provider regarding development and update of comprehensive plan of care as evidenced by provider attestation and co-signature Inter-disciplinary care team collaboration (see longitudinal plan of care) Evaluation of current treatment plan related to  self management and patient's adherence to plan as established by provider Review resources, discussed options and provided patient information about  Department of Social Services ( food stamps and Medicaid ) Transportation provided by [employer] connected with insurance provider  Referral to care guide (meals on wheels and housing resource)  Publishing rights manager (PCS) :Does not have Medicaid Private pay options for personal care needs :unable to afford Meals on wheels ( ) Facility placement process :Not interested  Level of Care Concerns in a patient with Anxiety and Dementia:  (Status: New goal.) Current level of care: home, alone and temp staying with daughter in [place] Evaluation of patient safety in current living environment Solution-Focused Strategies employed:  Active listening / Reflection utilized  Problem [place] strategies reviewed Consideration of in-home help encouraged : options discussed Made referral to Care Guide  Assessed needs and reviewed facility placement process; as well as the different levels of care, Provided a list of facilities based on level of care needs, Discussed payment options for placement :private pay and Medicaid, and Provided educational information "When a loved one needs Long-term  Care"   Patient Self-Care Activities: Call Department of Social Services [phone] to apply for food stamps and Medicaid Call your insurance provider to discuss transportation options Call your insurance provider for more information about your Enhanced Benefits  I have placed a referral to the community resource care guide they will call you with resources  Per your request I e-mailed educational information for Long-term Care    Casimer Lanius, [place] Licensed Clinical Social Worker [facility] manager Primary [facility] [place]  [phone]

## 2021-07-18 NOTE — Patient Instructions (Signed)
Visit Information   Thank you for taking time to visit with me today. Please don't hesitate to contact me if I can be of assistance to you before our next scheduled telephone appointment.  Following are the goals we discussed today: Connecting with community support  Our next appointment is by telephone on Feb 13th at 11:00  Please call the care guide team at [phone] if you need to cancel or reschedule your appointment.   If you are experiencing a Mental Health or [place] or need someone to talk to, please call 1-800-273-TALK (toll free, 24 hour hotline) call 911   Following is a copy of your full care plan:  Care Plan : LCSW Plan of Care  Updates made by Maurine Cane, LCSW since 07/18/2021 12:00 AM     Problem: Long-Term Care Planning      Goal: Effective Long-Term Care Planning for health needs   Start Date: 07/18/2021  Expected End Date: 09/22/2021  This Visit's Progress: On track  Priority: High  Note:   Current Barriers:  Level of Care Concerns:Inability to perform ADL's independently and Memory Deficits  CSW Clinical Goal(s):  Patient  and Caregiver  will work with agencies discussed and LCSW to address needs related to level of care  through collaboration with [employer] representative, provider, and care team.   Interventions: 1:1 collaboration with primary care provider regarding development and update of comprehensive plan of care as evidenced by provider attestation and co-signature Inter-disciplinary care team collaboration (see longitudinal plan of care) Evaluation of current treatment plan related to  self management and patient's adherence to plan as established by provider Review resources, discussed options and provided patient information about  Department of Social Services ( food stamps and Medicaid ) Transportation provided by [employer] connected with insurance provider  Referral to care guide (meals on  wheels and housing resource)  Publishing rights manager (PCS) :Does not have Medicaid Private pay options for personal care needs :unable to afford Meals on wheels ( ) Facility placement process :Not interested  Level of Care Concerns in a patient with Anxiety and Dementia:  (Status: New goal.) Current level of care: home, alone and temp staying with daughter in [place] Evaluation of patient safety in current living environment Solution-Focused Strategies employed:  Active listening / Reflection utilized  Problem [place] strategies reviewed Consideration of in-home help encouraged : options discussed Made referral to Care Guide Information e-mailed Assessed needs and reviewed facility placement process; as well as the different levels of care, Provided a list of facilities based on level of care needs, Discussed payment options for placement :private pay and Medicaid, and Provided educational information "When a loved one needs Long-term Care"   Patient Self-Care Activities: Call Department of Social Services [phone] to apply for food stamps and Medicaid Call your insurance provider to discuss transportation options Call your insurance provider for more information about your Enhanced Benefits  I have placed a referral to the community resource care guide they will call you with resources  Per your request I e-mailed educational information for Long-term Care    Consent to CCM Services: Ms. Whitelaw was given information about Chronic Care Management services including:  CCM service includes personalized support from designated clinical staff supervised by her physician, including individualized plan of care and coordination with other care providers 24/7 contact phone numbers for assistance for urgent and routine care needs. Service will only be billed when office clinical staff spend  20 minutes or more in a month to coordinate care. Only one practitioner may furnish  and bill the service in a calendar month. The patient may stop CCM services at any time (effective at the end of the month) by phone call to the office staff. The patient will be responsible for cost sharing (co-pay) of up to 20% of the service fee (after annual deductible is met).  Patient agreed to services and verbal consent obtained.   Patient verbalizes understanding of instructions and care plan provided today and agrees to view in [place]. Active MyChart status confirmed with patient.    Casimer Lanius, LCSW Licensed Clinical Social Worker [facility]  [place]  [phone]

## 2021-07-19 ENCOUNTER — Telehealth: Payer: Self-pay | Admitting: Internal Medicine

## 2021-07-19 NOTE — Telephone Encounter (Signed)
I think she is looking for a HHA which is not covered by insurance.    If she needs PT or nursing ( if she has a nursing need - would only come out once a week and would not do any HHA things) we can consider that.

## 2021-07-19 NOTE — Telephone Encounter (Signed)
Called patient to schedule AWV.  While on the home patient asked that

## 2021-07-19 NOTE — Telephone Encounter (Signed)
Called patient to schedule AWV.  While on the phone patient stated that she needed a new referral for in home care. I advised patient that I would send a message and have someone clinical contact her in regards to this request.

## 2021-07-20 ENCOUNTER — Other Ambulatory Visit: Payer: Self-pay | Admitting: Internal Medicine

## 2021-07-20 NOTE — Telephone Encounter (Signed)
Spoke with patient today and info given. 

## 2021-07-25 DIAGNOSIS — I10 Essential (primary) hypertension: Secondary | ICD-10-CM | POA: Diagnosis not present

## 2021-07-25 DIAGNOSIS — E119 Type 2 diabetes mellitus without complications: Secondary | ICD-10-CM | POA: Diagnosis not present

## 2021-07-26 ENCOUNTER — Telehealth: Payer: Self-pay | Admitting: *Deleted

## 2021-07-27 ENCOUNTER — Ambulatory Visit: Payer: Medicare (Managed Care)

## 2021-07-27 ENCOUNTER — Other Ambulatory Visit: Payer: Self-pay

## 2021-08-01 ENCOUNTER — Telehealth: Payer: Medicare (Managed Care)

## 2021-08-03 ENCOUNTER — Telehealth: Payer: Self-pay | Admitting: *Deleted

## 2021-08-03 NOTE — Telephone Encounter (Signed)
° °  Telephone encounter was:  Successful.  08/03/2021 Name: Janet Le MRN: [id number] DOB: [birth year]  Janet Le is a 66 y.o. year old female who is a primary care patient of Burns, Claudina Lick, MD . The community resource team was consulted for assistance with Transportation Needs , Food Insecurity, and Caregiver Stress  Care guide performed the following interventions: Patient provided with information about care guide support team and interviewed to confirm resource needs Follow up call placed to community resources to determine status of patients referral.Called [place] to see if information received  Follow Up Plan:  No further follow up planned at this time. The patient has been provided with needed resources.  [facility], Care Management  [phone] [address] , [place] [address] Email : [email or web address]

## 2021-08-03 NOTE — Telephone Encounter (Signed)
° °  Telephone encounter was:  Unsuccessful.  08/03/2021 Name: Janet Le MRN: [id number] DOB: 04-20-1956  Unsuccessful outbound call made today to assist with:  Transportation Needs , Food Insecurity, and Caregiver Stress  Outreach Attempt:  2nd Attempt  A HIPAA compliant voice message was left requesting a return call.  Instructed patient to call back at   Instructed patient to call back at [phone]  at their earliest convenience. .  [facility], Care Management  [phone] [address] , [place] [address] Email : [email or web address]

## 2021-08-07 ENCOUNTER — Telehealth: Payer: Medicare (Managed Care)

## 2021-08-07 ENCOUNTER — Ambulatory Visit (INDEPENDENT_AMBULATORY_CARE_PROVIDER_SITE_OTHER): Payer: Medicare (Managed Care) | Admitting: Licensed Clinical Social Worker

## 2021-08-07 DIAGNOSIS — F331 Major depressive disorder, recurrent, moderate: Secondary | ICD-10-CM

## 2021-08-07 DIAGNOSIS — F411 Generalized anxiety disorder: Secondary | ICD-10-CM

## 2021-08-07 NOTE — Patient Instructions (Signed)
Visit Information  Thank you for taking time to visit with me today. Please don't hesitate to contact me if I can be of assistance to you before our next scheduled telephone appointment.  Following are the goals we discussed today: Connecting you for mental health treatment  Our next appointment is by telephone on Feb 27th  at 11:15  Please call the care guide team at [phone] if you need to cancel or reschedule your appointment.   If you are experiencing a Mental Health or [place] or need someone to talk to, please call 1-800-273-TALK (toll free, 24 hour hotline) go to [facility] Care [address], [place] [phone])   Patient verbalizes understanding of instructions and care plan provided today and agrees to view in [place]. Active MyChart status confirmed with patient.    Casimer Lanius, LCSW Licensed Clinical Social Worker [facility]  [place]  [phone]

## 2021-08-07 NOTE — Chronic Care Management (AMB) (Signed)
Chronic Care Management   Clinical Social Work Note  08/07/2021 Name: OMAR ORREGO MRN: [id number] DOB: [birth year]  NOHELI MELDER is a 66 y.o. year old female who is a primary care patient of Burns, Claudina Lick, MD. The CCM team was consulted to assist the patient with chronic disease management and/or care coordination needs related to: Mental Health Counseling and Resources.   Engaged with patient by telephone for follow up visit in response to provider referral for social work chronic care management and care coordination services.   Consent to Services:  The patient was given information about Chronic Care Management services, agreed to services, and gave verbal consent prior to initiation of services.  Please see initial visit note for detailed documentation.   Patient agreed to services and consent obtained.   Summary: Assessed patient's current treatment, progress, coping skills, support system and barriers to care.  She has moved back home from temp living with daughter, and continues to experience difficulty with managing symptoms of anxiety, wants an increase in Cymbalta..  See Care Plan below for interventions and patient self-care actives.  Recommendation: Patient may benefit from, and is in agreement for LCSW to make referral to psychiatry to discuss medication adjustment and therapy to assist with managing symptoms.   Follow up Plan: Patient would like continued follow-up from CCM LCSW.  per patient's request will follow up in 2 weeks.  Will call office if needed prior to next encounter.   Assessment: Review of patient past medical history, allergies, medications, and health status, including review of relevant consultants reports was performed today as part of a comprehensive evaluation and provision of chronic care management and care coordination services.     SDOH (Social Determinants of Health) assessments and interventions performed:    Advanced Directives Status: Not  addressed in this encounter.  CCM Care Plan  Conditions to be addressed/monitored: Anxiety and Depression;   Care Plan : LCSW Plan of Care  Updates made by Maurine Cane, LCSW since 08/07/2021 12:00 AM     Problem: Long-Term Care Planning      Goal: Effective Long-Term Care Planning for mental health needs   Start Date: 07/18/2021  Expected End Date: 09/22/2021  Recent Progress: On track  Priority: High  Note:   Current Barriers:  Level of Care Concerns:Inability to perform ADL's independently and Memory Deficits  CSW Clinical Goal(s):  Patient  and Caregiver  will work with agencies discussed and LCSW to address needs related to level of care  through collaboration with [employer] representative, provider, and care team.   Interventions: 1:1 collaboration with primary care provider regarding development and update of comprehensive plan of care as evidenced by provider attestation and co-signature Inter-disciplinary care team collaboration (see longitudinal plan of care) Evaluation of current treatment plan related to  self management and patient's adherence to plan as established by provider Review resources, discussed options and provided patient information about  Department of Social Services ( food stamps and Medicaid ) Transportation provided by [employer] connected with insurance provider  Referral to care guide (meals on wheels and housing resource)  Publishing rights manager (PCS) :Does not have Medicaid Private pay options for personal care needs :unable to afford Meals on wheels (Care guide to assist with) Facility placement process :Not interested  Level of Care Concerns in a patient with Anxiety and Dementia:  (Status: Condition stable. Not addressed this visit.) Current level of care: home, alone and temp staying with daughter  in [place] Evaluation of patient safety in current living environment Solution-Focused Strategies employed:   Active listening / Reflection utilized  Problem [place] strategies reviewed Consideration of in-home help encouraged : options discussed Made referral to Care Guide Assessed needs and reviewed facility placement process; as well as the different levels of care, Provided a list of facilities based on level of care needs, Discussed payment options for placement :private pay and Medicaid, and Provided educational information "When a loved one needs Long-term Care"   Mental Health:  (Status: New goal.) Evaluation of current treatment plan related to Anxiety with Excessive Worry, and symptoms of depression Solution-Focused Strategies employed:  Active listening / Reflection utilized  Reviewed mental health medications and discussed importance of compliance: currently taking cymbalta thinks it needs to be increased Participation in counseling encouraged  Discussed referral for psychiatry: ok with LCSW making referral  Made referral to [facility]  Patient Self-Care Activities: I have placed a referral [facility] they will contact you.       Casimer Lanius, LCSW Licensed Clinical Social Worker [facility]  [place]  [phone]

## 2021-08-09 ENCOUNTER — Other Ambulatory Visit: Payer: Self-pay | Admitting: Internal Medicine

## 2021-08-09 ENCOUNTER — Other Ambulatory Visit: Payer: Self-pay | Admitting: Gastroenterology

## 2021-08-09 DIAGNOSIS — M542 Cervicalgia: Secondary | ICD-10-CM

## 2021-08-09 NOTE — Telephone Encounter (Signed)
Ok to PCP please 

## 2021-08-21 ENCOUNTER — Ambulatory Visit: Payer: Medicare (Managed Care) | Admitting: Licensed Clinical Social Worker

## 2021-08-21 DIAGNOSIS — Z139 Encounter for screening, unspecified: Secondary | ICD-10-CM

## 2021-08-21 DIAGNOSIS — I10 Essential (primary) hypertension: Secondary | ICD-10-CM

## 2021-08-21 DIAGNOSIS — F4310 Post-traumatic stress disorder, unspecified: Secondary | ICD-10-CM

## 2021-08-21 DIAGNOSIS — F331 Major depressive disorder, recurrent, moderate: Secondary | ICD-10-CM

## 2021-08-21 NOTE — Patient Instructions (Addendum)
Visit Information  Thank you for taking time to visit with me today. Please don't hesitate to contact me if I can be of assistance to you before our next scheduled telephone appointment.  Following are the goals we discussed today:  Mental Health support and community resources  Patient Self-Care Activities: I have placed a referral to [facility] for medication management they will contact you.   I have placed a referral to [facility] for ongoing therapy, they will contact you. I have placed a referral to the care guide who will call you about transportation and housing information   Call Department of Social Service to inquire about the in-home aide program [phone] ( please remember they have a wait list)  Our next appointment is by telephone on March 27th at 10:00  Please call the care guide team at [phone] if you need to cancel or reschedule your appointment.   If you are experiencing a Mental Health or [place] or need someone to talk to, please call 1-800-273-TALK (toll free, 24 hour hotline) go to [facility] Urgent Care [address] [place] [phone]) call 911   The patient verbalized understanding of instructions, educational materials, and care plan provided today and agreed to receive a mailed copy of patient instructions, educational materials, and care plan.   Casimer Lanius, LCSW Licensed Clinical Social Worker [facility]  [place]  [phone]

## 2021-08-21 NOTE — Chronic Care Management (AMB) (Signed)
Chronic Care Management   Clinical Social Work Note  08/21/2021 Name: Janet Le MRN: [id number] DOB: February 20, 1956  Janet Le is a 66 y.o. year old female who is a primary care patient of Burns, Claudina Lick, MD. The CCM team was consulted to assist the patient with chronic disease management and/or care coordination needs related to: Transportation Needs , [employer] , and [place] and Resources.   Engaged with patient by telephone for follow up visit in response to provider referral for social work chronic care management and care coordination services.   Consent to Services:  The patient was given information about Chronic Care Management services, agreed to services, and gave verbal consent prior to initiation of services.  Please see initial visit note for detailed documentation.  Patient agreed to services and consent obtained.   Summary: Assessed patient's current treatment, progress, coping skills, support system and barriers to care.  She continues to experience difficulty with meeting ADL's would like additional support in the home, however has limited options.  Would also like to move forward with mental health support..  See Care Plan below for interventions and patient self-care actives.  Recommendation: Patient may benefit from, and is in agreement for LCSW to make referral to [facility] and CCM care guide for additional community resources.   Follow up Plan: Patient would like continued follow-up from CCM LCSW.  per patient's request will follow up in 30 days.  Will call office if needed prior to next encounter.   Assessment: Review of patient past medical history, allergies, medications, and health status, including review of relevant consultants reports was performed today as part of a comprehensive evaluation and provision of chronic care management and care coordination services.     SDOH (Social Determinants of Health) assessments  and interventions performed:    Advanced Directives Status: Not addressed in this encounter.  CCM Care Plan Conditions to be addressed/monitored: HTN and Depression; Cognitive Deficits  Care Plan : LCSW Plan of Care  Updates made by Maurine Cane, LCSW since 08/21/2021 12:00 AM     Problem: Long-Term Care Planning and support      Goal: Effective Long-Term Care Planning for mental health support   Start Date: 07/18/2021  This Visit's Progress: On track  Recent Progress: On track  Priority: High  Note:   Current Barriers:  Level of Care Concerns:Inability to perform ADL's independently and Memory Deficits SDOH Furniture conservator/restorer and housing information)  CSW Clinical Goal(s):  Patient  and Caregiver  will work with agencies discussed and LCSW to address needs related to level of care  through collaboration with [place] representative, provider, and care team.   Interventions: 1:1 collaboration with primary care provider regarding development and update of comprehensive plan of care as evidenced by provider attestation and co-signature Inter-disciplinary care team collaboration (see longitudinal plan of care) Evaluation of current treatment plan related to  self management and patient's adherence to plan as established by provider Review resources, discussed options and provided patient information about  Department of Social Services ( food stamps and Medicaid ) Transportation provided by [employer] fast streamer connected with insurance provider  Referral to care guide (meals on wheels and housing resource)  Publishing rights manager (PCS) :Does not have Medicaid Private pay options for personal care needs :unable to afford Meals on wheels (Care guide to assist with) Facility placement process :Not interested  Level of Care Concerns in a patient with Anxiety and mild cognitive  disorder  :  (Status: Goal on Track (progressing): YES.) Current level of care: home, alone  and temp staying with daughter in [place] Evaluation of patient safety in current living environment Solution-Focused Strategies employed:  Problem [place] strategies reviewed Consideration of in-home help encouraged : options discussed  Mental Health:  (Status: Goal on Track (progressing): YES.) Evaluation of current treatment plan related to Anxiety with Excessive Worry, and symptoms of depression Solution-Focused Strategies employed:  Active listening / Reflection utilized  Reviewed mental health medications and discussed importance of compliance: currently taking cymbalta thinks it needs to be increased Participation in counseling encouraged  Discussed referral for psychiatry: ok with LCSW making referral  Made referral to [facility]  for medication management  [place] [facility] for therapy   Patient Self-Care Activities: I have placed a referral [facility] for medication management they will contact you.   I have placed a referral to [facility] for ongoing therapy, they will contact you. I have placed a referral to the care guide who will call you about transportation and housing information   Call Department of Social Service to inquire about the in-home aide program [phone] ( please remember they have a wait list)      Casimer Lanius, LCSW Licensed Clinical Social Worker [facility]  [place]  [phone]

## 2021-08-22 DIAGNOSIS — F331 Major depressive disorder, recurrent, moderate: Secondary | ICD-10-CM

## 2021-08-22 DIAGNOSIS — I10 Essential (primary) hypertension: Secondary | ICD-10-CM

## 2021-08-29 ENCOUNTER — Other Ambulatory Visit: Payer: Self-pay

## 2021-08-29 ENCOUNTER — Ambulatory Visit (INDEPENDENT_AMBULATORY_CARE_PROVIDER_SITE_OTHER): Payer: 59

## 2021-08-29 DIAGNOSIS — E119 Type 2 diabetes mellitus without complications: Secondary | ICD-10-CM | POA: Diagnosis not present

## 2021-08-29 DIAGNOSIS — R609 Edema, unspecified: Secondary | ICD-10-CM

## 2021-08-29 DIAGNOSIS — E1140 Type 2 diabetes mellitus with diabetic neuropathy, unspecified: Secondary | ICD-10-CM

## 2021-08-29 DIAGNOSIS — E0843 Diabetes mellitus due to underlying condition with diabetic autonomic (poly)neuropathy: Secondary | ICD-10-CM | POA: Diagnosis not present

## 2021-08-29 NOTE — Progress Notes (Signed)
SITUATION Reason for Visit: Fitting of Diabetic Shoes & Insoles Patient / Caregiver Report:  Patient is satisfied with fit and function of shoes and insoles.  OBJECTIVE DATA: Patient History / Diagnosis:     ICD-10-CM   1. Diabetes mellitus due to underlying condition with diabetic autonomic neuropathy, unspecified whether long term insulin use (HCC)  E08.43       Change in Status:   None  ACTIONS PERFORMED: In-Person Delivery, patient was fit with: - 1x pair [id number] PDAC approved prefabricated Diabetic Shoes: [id number] - 3x pair [id number] PDAC approved vacuum formed custom diabetic insoles; [employer]: [id number]  Shoes and insoles were verified for structural integrity and safety. Patient wore shoes and insoles in office. Skin was inspected and free of areas of concern after wearing shoes and inserts. Shoes and inserts fit properly. Patient / Caregiver provided with ferbal instruction and demonstration regarding donning, doffing, wear, care, proper fit, function, purpose, cleaning, and use of shoes and insoles ' and in all related precautions and risks and benefits regarding shoes and insoles. Patient / Caregiver was instructed to wear properly fitting socks with shoes at all times. Patient was also provided with verbal instruction regarding how to report any failures or malfunctions of shoes or inserts, and necessary follow up care. Patient / Caregiver was also instructed to contact physician regarding change in status that may affect function of shoes and inserts.   Patient / Caregiver verbalized undersatnding of instruction provided. Patient / Caregiver demonstrated independence with proper donning and doffing of shoes and inserts.  PLAN Patient to follow up as needed. Plan of care was discussed with and agreed upon by patient and/or caregiver. All questions were answered and concerns addressed.

## 2021-08-30 ENCOUNTER — Encounter: Payer: Self-pay | Admitting: Registered"

## 2021-08-30 ENCOUNTER — Encounter: Payer: Medicare (Managed Care) | Attending: Internal Medicine | Admitting: Registered"

## 2021-08-30 DIAGNOSIS — E119 Type 2 diabetes mellitus without complications: Secondary | ICD-10-CM | POA: Insufficient documentation

## 2021-08-30 NOTE — Patient Instructions (Signed)
-   Aim for 3 meals a day. See handout for guide and 1-day sample menu.  ? ? ?

## 2021-08-30 NOTE — Progress Notes (Signed)
?Diabetes Self-Management Education ? ?Visit Type:  First/Initial ? ?Appt. Start Time: 11:07 Appt. End Time: 12:02 ? ?08/30/2021 ? ?Ms. Janet Le, identified by name and date of birth, is a 66 y.o. female with a diagnosis of Diabetes: Type 2.   ?ASSESSMENT ? ?Pt arrives at appointment alone. States she does not have type 2 diabetes but prediabetes. Recent A1c of 5.7%. States sometimes when she stands up she feels weak and like she is going to faint.  ?States she experiences blurred vision, increased thirst, and frequent urination often.  ? ?States she slept late yesterday, woke up around 11 am. States she typically wakes up around 10-11 am. States she goes to bed around 9 pm. States she has insomnia some nights and unable to sleep. States she has been eating a lot of salads lately to lose weight. States she is getting older, her body is changing, and she wants to have weight loss surgery. States she was told she needed to lose weight prior to having weight loss surgery and she's trying to do that. States she wants to reduce medication intake as a result of weight loss surgery.  ? ?States she lives alone and grandson is there sometimes. States she does not have to cook much. States she orders groceries and they are delivered to her home. States she tries to put starch, protein, and vegetable with each meal.  ? ?There were no vitals taken for this visit. ?There is no height or weight on file to calculate BMI. ?  ? Diabetes Self-Management Education - 08/30/21 1116   ? ?  ? Health Coping  ? How would you rate your overall health? Fair   ?  ? Psychosocial Assessment  ? Self-care barriers None   ? Patient Concerns Nutrition/Meal planning   ? Special Needs None   ? Preferred Learning Style No preference indicated   ?  ? Complications  ? Last HgB A1C per patient/outside source 5.7 %   ? How often do you check your blood sugar? 0 times/day (not testing)   ? Number of hypoglycemic episodes per month 4   ? Can you tell  when your blood sugar is low? No   ? Number of hyperglycemic episodes per week 5   ? Can you tell when your blood sugar is high? No   ? Have you had a dilated eye exam in the past 12 months? No   ? Have you had a dental exam in the past 12 months? No   ?  ? Dietary Intake  ? Breakfast skipped; asleep until 11 am   ? Lunch 12 pm - salad (vegetables, eggs) + creamy honey mustard   ? Snack (afternoon) [employer] bar   ? Dinner 6-7 pm - salad (vegetables, eggs) + creamy honey mustard   ? Snack (evening) 10 pm - popcorn   ? Beverage(s) beet juice (8 oz), water (32 oz), blackberry ginger ale (12-16 oz); 52-56 oz   ?  ? Exercise  ? Exercise Type Light (walking / raking leaves)   ? How many days per week to you exercise? 7   ? How many minutes per day do you exercise? 15   ? Total minutes per week of exercise 105   ?  ? Patient Education  ? Previous Diabetes Education Yes (please comment)   years ago  ? Disease state  Factors that contribute to the development of diabetes   ? Nutrition management  Role of diet in the  treatment of diabetes and the relationship between the three main macronutrients and blood glucose level   ?  ? Individualized Goals (developed by patient)  ? Nutrition Follow meal plan discussed;General guidelines for healthy choices and portions discussed   ? Physical Activity Exercise 1-2 times per week;15 minutes per day   ? Medications take my medication as prescribed   ? Health Coping Not Applicable   ?  ? Outcomes  ? Program Status Other (comment)   ? ?  ?  ? ?  ? ? ?Learning Objective:  Patient will have a greater understanding of diabetes self-management. ?Patient education plan is to attend individual and/or group sessions per assessed needs and concerns. ? ? ?Plan:  ? ?Patient Instructions  ?- Aim for 3 meals a day. See handout for guide and 1-day sample menu.  ? ? ? ? ?Expected Outcomes:  Demonstrated interest in learning. Expect positive outcomes ? ?Education material provided:  Hypertension Nutrition Therapy due to patient stating she did not have type 2 diabetes and had more concerns about hypertension nutrition and weight loss.  ? ?If problems or questions, patient to contact team via:  Phone and Email ? ?Future DSME appointment: - PRN ?

## 2021-09-06 NOTE — Progress Notes (Signed)
? ?NEUROLOGY FOLLOW UP OFFICE NOTE ? ?Janet Le ?[id number] ? ?Assessment/Plan:  ? ?Neurocognitive disorder - unclear etiology but concern for Lewy Body dementia.  Suspect some mild progression ?Idiopathic polyneuropathy stable ?  ?Increase rivastigmine to 4.'5mg'$  twice daily ?Advised to continue daily activities (medication management, finances, chores) but should have somebody monitor. ?Repeat neuropsychological testing in 6 months ?Continue to refrain from driving ?Follow up with me in 6 months (after repeat testing).  ? ? Total time spent with patient and reviewing chart:  45 minutes. ?  ?Subjective:  ?Janet Le is a 66 year old right-handed woman with hypertension, pre-diabetes, TB, IBS, lymphedema, osteoarthritis and osteoporosis and history of renal cell carcinoma who follows up for neuropathy and confusion.   ?  ?UPDATE: ?Current medications:  rivastigmine '3mg'$  BID, duloxetine '60mg'$ , propranolol '80mg'$  BID, amlodipine, Lasix, tramadol ?  ?Denies neuropathic pain. ?  ?  ?  ?Denies recent hallucinations.  Memory is a little worse - misplaces objects or forgets why she entered a room.  She has not been driving.  Lives alone but she has a close friend, Kennyth Lose, who often checks on her.  Children also live by.  She is supposed to have home health come over for assessment.  She says she still pays bills but thinks she needs help.  She is having her medications set up in a pack from the pharmacy. ? ?  ?  ?HISTORY: ?I.  COGNITIVE DEFICITS/VISUAL DISTURBANCE/TREMORS: ?In 2014, she started experiencing visual disturbance.  She describes initially seeing a line of light along the bottom of her visual field in the left eye.  She then developed what looks like bubbles floating up in the temporal aspect of the visual fields of in both eyes.  It lasts only a few seconds and occurs several times a day.  She also began experiencing dizziness and lightheadedness and was found to have orthostatic hypotension.  In 2018,  she began noticing memory problems.  Labs from May 2019 included normal B12 528 and TSH 1.37.  She has been treated for latent TB.   Around this time, she began experiencing tremors in the hands and occasional jerks of her extremities.   She underwent neuropsychological testing 07/09/18.  She demonstrated probable deficits of processing speed, executive function, visual memory significantly more than verbal memory, and Ecologist.  It was noted that such a profile may be seen in early Parkinson's disease or a Parkinson's plus syndrome.  However, her performance may have been skewed due to observed anxiety and mildly suboptimal task persistence.  In March 2022, she was confused and hallucinating.  She was looking out of the house at her car and looked like there was somebody in her car.  When she went out to her car, nobody was there.  She started seeing people, strangers, not there in her house. She said that they were coming in through the walls.  She may quickly think she sees something (like a figure) and when she turns her head to that direction, it is gone.    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  She underwent repeat neuropsychological evaluation on 01/25/2021.  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  Given her history of gait instability, hallucinations, and tremor, Lewy body dementia suspected. MRI of brain on 12/19/2013 showed small 11 mm left frontal dural calcification vs meningioma at the left vertex.  MRI of brain on  10/05/2020 which showed stable 11 mm left frontal convexity lesion (probable meningioma), minimal chronic small vessel disease ?  ?II.  NEUROPATHY:  In 2016, she started experiencing burning pain from the bottom of her feet up the back of her legs to the knees.  It is intermittent and lasts for various and unknown period of time, but not aware of any  triggers such as due to position.  In addition, she is reporting increased back pain and spasms, worse when sitting and improved when laying supine.  She has a history of chronic low back pain with left lumbar radiculopathy, as well as right knee pain.  She has been seen and evaluated by orthopedics and pain management.  MRI of lumbar spine from 07/27/16 revealed advanced L4-5 and L5-S1 facet arthrosis with some bilateral neural foraminal stenosis.  Dr. Marlou Sa of orthopedics did not think surgery was an option at that time.  TSH from 10/15/16 was 1.67.  B12 from 10/18/16 was 537.  Hgb A1c from 04/05/17 was 5.6.  NCV-EMG from 10/25/16 demonstrated no evidence of large fiber sensorimotor polyneuropathy or lumbosacral radiculopathy.  MRI of brain and cervical spine on 10/05/2020 which showed stable 11 mm left frontal convexity meningioma, minimal chronic small vessel disease, moderate cervical spondylosis from C4 through C7 and small central disc protrusion at C7-T1 but no acute intracranial abnormality or spinal stenosis or cord abnormality.   Labs from 11/02/2020 include negative ANA, sed rate 16, ACE 23, SPEP/IFE negative for M-spike.  NCV-EMG on 11/22/2020 now demonstrated chronic sensorimotor axonal polyneuropathy. ? ?PAST MEDICAL HISTORY: ?Past Medical History:  ?Diagnosis Date  ? Allergic rhinitis 08/31/2009  ? Arthralgia of hip 11/14/2017  ? Attention deficit hyperactivity disorder 06/06/2020  ? diagnosed as adult w/o formal testing  ? Bilateral leg edema 06/12/2018  ? Blepharitis 11/07/2019  ? Blood dyscrasia   ? platelets low in past  ? Cataract   ? Cervical radiculopathy 09/07/2020  ? Cervicalgia 05/30/2016  ? Chronic low back pain 07/18/2016  ? Degeneration of lumbar intervertebral disc 01/09/2018  ? Dyspnea on exertion 05/11/2019  ? Endometriosis   ? Esophageal stricture   ? Essential hypertension 05/06/2009  ? Excessive daytime sleepiness 05/11/2019  ? External hemorrhoids 08/28/2010  ? Fatigue 11/14/2017  ?  Generalized anxiety disorder 10/08/2017  ? GERD (gastroesophageal reflux disease)   ? Glaucoma 06/06/2020  ? Gout 12/11/2013  ? Hiatal hernia   ? Hypertensive retinopathy of both eyes 10/06/2019  ? IBS (irritable bowel syndrome) with chronic constipation 11/14/2010  ? Iridocyclitis 10/06/2019  ? Laryngopharyngeal reflux (LPR) 07/03/2018  ? Lattice degeneration of both retinas 10/06/2019  ? Left lumbar radiculopathy 04/13/2012  ? LVH (left ventricular hypertrophy), mild 05/18/2017  ? Echo 04/2017 - mild LVH, normal EF, Grade 1 DD  ? Lymphedema   ? Major depressive disorder 10/08/2017  ? Migraine 06/14/2009  ? Mild neurocognitive disorder with Lewy bodies 01/25/2021  ? Neuropathy 10/07/2017  ? Nocturnal hypoxemia 05/11/2019  ? Osteoarthritis   ? Osteopenia 11/18/2015  ? 06/10/2018: LFN -1.1, left radius 0.7, low FRAX.  No significant change from 2017  dexa 10/2015: t score  Spine -1.3, dual femur -0.9  Took alendronate 2014-2019  ? Post traumatic stress disorder (PTSD)   ? Primary osteoarthritis involving multiple joints 03/10/2019  ? Bilateral hips, knees  ? Pseudophakia of both eyes 10/06/2019  ? Pure hypercholesterolemia 03/16/2010  ? Renal cell carcinoma   ? R cryoablation by IR 02/16/11, Dr. Kathlene Cote  Followed by Dr. Janee Morn  No  evidence of residual disease on CT 12/13 and 12/14  ? Rib pain on left side 01/03/2021  ? S/P knee surgery 10/30/2016  ? Spondylolisthesis of lumbar region 01/09/2018  ? Syncope and collapse 05/16/2009  ? since childhood; associated with extreme heat  ? TB lung, latent 11/25/2019  ? Thrombocytopenia 05/30/2015  ? Saw hem 02/2017 - mild, chronic - advised to stop protonix, no concerning cause, plan for pcp to monitor  ? Tinnitus of right ear 07/03/2018  ? Type 2 diabetes mellitus without complication, without long-term current use of insulin 10/06/2019  ? Visual hallucinations   ? [place] itching 01/03/2021  ? ? ?MEDICATIONS: ?Current Outpatient Medications on File Prior to Visit  ?Medication  Sig Dispense Refill  ? Accu-Chek FastClix Lancets MISC USE AS DIRECTED UP  TO  1 TIME  DAILY- DX CODE R73.03 100 each 1  ? acetaminophen (TYLENOL) 325 MG tablet Take 1,300 mg by mouth at bedtime.    ? amLODipine (NO

## 2021-09-07 ENCOUNTER — Encounter: Payer: Self-pay | Admitting: Neurology

## 2021-09-07 ENCOUNTER — Other Ambulatory Visit: Payer: Self-pay

## 2021-09-07 ENCOUNTER — Ambulatory Visit (INDEPENDENT_AMBULATORY_CARE_PROVIDER_SITE_OTHER): Payer: 59 | Admitting: Neurology

## 2021-09-07 VITALS — BP 107/70 | HR 68 | Ht 62.0 in | Wt 220.0 lb

## 2021-09-07 DIAGNOSIS — F02A0 Dementia in other diseases classified elsewhere, mild, without behavioral disturbance, psychotic disturbance, mood disturbance, and anxiety: Secondary | ICD-10-CM | POA: Diagnosis not present

## 2021-09-07 DIAGNOSIS — G3183 Dementia with Lewy bodies: Secondary | ICD-10-CM | POA: Diagnosis not present

## 2021-09-07 DIAGNOSIS — G609 Hereditary and idiopathic neuropathy, unspecified: Secondary | ICD-10-CM | POA: Diagnosis not present

## 2021-09-07 MED ORDER — RIVASTIGMINE TARTRATE 4.5 MG PO CAPS: 4.5000 mg | ORAL_CAPSULE | Freq: Two times a day (BID) | ORAL | 5 refills | Status: DC

## 2021-09-07 NOTE — Patient Instructions (Signed)
Increase rivastigmine to 4.'5mg'$  twice daily ?Repeat neuropsychological evaluation - earliest August ?I would not recommend driving ?Have family or friend make sure bills are paid correctly and reminding you to take medication ?Follow up after repeat testing ?

## 2021-09-12 ENCOUNTER — Other Ambulatory Visit: Payer: Self-pay

## 2021-09-12 ENCOUNTER — Ambulatory Visit (HOSPITAL_COMMUNITY): Payer: 59 | Admitting: Clinical

## 2021-09-13 ENCOUNTER — Ambulatory Visit (INDEPENDENT_AMBULATORY_CARE_PROVIDER_SITE_OTHER): Payer: Commercial Managed Care - HMO | Admitting: *Deleted

## 2021-09-13 DIAGNOSIS — E119 Type 2 diabetes mellitus without complications: Secondary | ICD-10-CM

## 2021-09-13 DIAGNOSIS — I10 Essential (primary) hypertension: Secondary | ICD-10-CM

## 2021-09-13 DIAGNOSIS — F02A0 Dementia in other diseases classified elsewhere, mild, without behavioral disturbance, psychotic disturbance, mood disturbance, and anxiety: Secondary | ICD-10-CM

## 2021-09-13 NOTE — Chronic Care Management (AMB) (Signed)
?Chronic Care Management  ? ?CCM RN Visit Note ? ?09/13/2021 ?Name: Janet Le MRN: [id number] DOB: 25-Sep-1955 ? ?Subjective: ?Janet Le is a 66 y.o. year old female who is a primary care patient of Burns, Claudina Lick, MD. The care management team was consulted for assistance with disease management and care coordination needs.   ? ?Engaged with patient by telephone for follow up visit in response to provider referral for case management and/or care coordination services.  ? ?Consent to Services:  ?The patient was given information about Chronic Care Management services, agreed to services, and gave verbal consent prior to initiation of services.  Please see initial visit note for detailed documentation.  ?Patient agreed to services and verbal consent obtained.  ? ?Assessment: Review of patient past medical history, allergies, medications, health status, including review of consultants reports, laboratory and other test data, was performed as part of comprehensive evaluation and provision of chronic care management services.  ? ?SDOH (Social Determinants of Health) assessments and interventions performed:  ?SDOH Interventions   ? ?Flowsheet Row Most Recent Value  ?SDOH Interventions   ?Food Insecurity Interventions Intervention Not Indicated  [Denies food insecurity- on MOW wait list per CCM CSW]  ?Housing Interventions Intervention Not Indicated  [facility] CSW working with actively on housing needs/ desire to change residence situation]  ?Transportation Interventions Other (Comment)  [urgent [employer] referral placed for April appointments- used to use [employer], no longer available]  ? ?  ?CCM Care Plan ? ?Allergies  ?Allergen Reactions  ? Sertraline Hcl Other (See Comments)  ?  Spasms; numbness  ? Ace Inhibitors Cough  ? Codeine Palpitations  ? Darifenacin Hydrobromide Er Other (See Comments)  ?  Pt states made her feel queezy & drunk  ? Gabapentin Other (See Comments)  ?   Hallucinations  ? Pravastatin Other (See Comments)  ?  myalgias  ? Propoxyphene Hcl Palpitations  ? Wellbutrin [Bupropion Hcl] Other (See Comments)  ?  Numbness of mouth/lips  ? ?Outpatient Encounter Medications as of 09/13/2021  ?Medication Sig  ? Accu-Chek FastClix Lancets MISC USE AS DIRECTED UP  TO  1 TIME  DAILY- DX CODE R73.03  ? acetaminophen (TYLENOL) 325 MG tablet Take 1,300 mg by mouth at bedtime.  ? amLODipine (NORVASC) 5 MG tablet TAKE ONE TABLET BY MOUTH ONCE DAILY  ? atorvastatin (LIPITOR) 10 MG tablet TAKE ONE TABLET BY MOUTH ONCE DAILY  ? Blood Glucose Monitoring Suppl (ACCU-CHEK GUIDE ME) w/Device KIT Use to check blood sugar daily  ? calcium carbonate (OS-CAL) 600 MG TABS tablet Take 600 mg by mouth 2 (two) times daily with a meal.  ? clotrimazole (LOTRIMIN) 1 % cream SMARTSIG:1 Topical Daily PRN  ? dicyclomine (BENTYL) 10 MG capsule TAKE 1 TO 2 CAPSULES BY MOUTH three times daily AS NEEDED  ? DULoxetine (CYMBALTA) 60 MG capsule TAKE ONE CAPSULE BY MOUTH ONCE DAILY  ? Elastic Bandages & Supports (MEDICAL COMPRESSION SOCKS) MISC Compression sock, medium compression 20-30 mmHg. Use daily for venous insufficiency I87.2  ? folic acid (FOLVITE) 1 MG tablet Take 1 tablet (1 mg total) by mouth daily.  ? furosemide (LASIX) 20 MG tablet TAKE ONE TABLET BY MOUTH ONCE DAILY  ? glucose blood (ACCU-CHEK GUIDE) test strip Use to check sugars once daily. Dx Code-R73.03  ? Incontinence Supply Disposable (COMFORT PROTECT ADULT DIAPER/L) MISC Use as directed for urinary incontinence  ? linaclotide (LINZESS) 145 MCG CAPS capsule Take 1 capsule (145 mcg total) by  mouth daily.  ? methotrexate 2.5 MG tablet Take 10 mg by mouth once a week. Mondays  ? Misc Natural Products (HEALTHY LIVER PO) Take by mouth.  ? Multiple Vitamin (MULTIVITAMIN) tablet Take 1 tablet by mouth daily.  ? polyethylene glycol powder (GLYCOLAX/MIRALAX) 17 GM/SCOOP powder Take 17 g by mouth daily as needed for moderate constipation.  ? propranolol  (INDERAL) 80 MG tablet TAKE ONE TABLET BY MOUTH TWICE DAILY  ? rivastigmine (EXELON) 4.5 MG capsule Take 1 capsule (4.5 mg total) by mouth 2 (two) times daily.  ? telmisartan (MICARDIS) 80 MG tablet TAKE ONE TABLET BY MOUTH ONCE DAILY  ? ?No facility-administered encounter medications on file as of 09/13/2021.  ? ?Patient Active Problem List  ? Diagnosis Date Noted  ? Urinary incontinence 07/03/2021  ? Breast asymmetry 04/08/2021  ? Venous insufficiency of both lower extremities 04/06/2021  ? Mild neurocognitive disorder with Lewy bodies 01/25/2021  ? Cervical radiculopathy 09/07/2020  ? Visual hallucinations   ? Attention deficit hyperactivity disorder 06/06/2020  ? Glaucoma 06/06/2020  ? Blepharitis 11/07/2019  ? Type 2 diabetes mellitus without complication, without long-term current use of insulin 10/06/2019  ? Hypertensive retinopathy of both eyes 10/06/2019  ? Iridocyclitis 10/06/2019  ? Lattice degeneration of both retinas 10/06/2019  ? Pseudophakia of both eyes 10/06/2019  ? Nocturnal hypoxemia 05/11/2019  ? Dyspnea on exertion 05/11/2019  ? Excessive daytime sleepiness 05/11/2019  ? Primary osteoarthritis involving multiple joints 03/10/2019  ? Post traumatic stress disorder (PTSD)   ? Laryngopharyngeal reflux (LPR) 07/03/2018  ? Bilateral leg edema 06/12/2018  ? Degeneration of lumbar intervertebral disc 01/09/2018  ? Spondylolisthesis of lumbar region 01/09/2018  ? Fatigue 11/14/2017  ? Arthralgia of hip 11/14/2017  ? Major depressive disorder 10/08/2017  ? Generalized anxiety disorder 10/08/2017  ? Neuropathy 10/07/2017  ? LVH (left ventricular hypertrophy), mild 05/18/2017  ? S/P knee surgery 10/30/2016  ? Chronic low back pain 07/18/2016  ? Cervicalgia 05/30/2016  ? Neck pain 05/30/2016  ? Osteopenia 11/18/2015  ? Thrombocytopenia 05/30/2015  ? Pain in left knee 09/08/2014  ? Gout 12/11/2013  ? Morbid obesity 11/06/2013  ? Left lumbar radiculopathy 04/13/2012  ? Renal cell carcinoma ([place])   ? IBS  (irritable bowel syndrome) with chronic constipation 11/14/2010  ? GERD (gastroesophageal reflux disease) 11/14/2010  ? Hyperlipidemia 03/16/2010  ? Allergic rhinitis 08/31/2009  ? Syncope and collapse 05/16/2009  ? Essential hypertension 05/06/2009  ? ?Conditions to be addressed/monitored:  HTN, DMII, and cognitive decline with possible Lewey bodies ? ?Care Plan : RN Care Manager Plan of Care  ?Updates made by Knox Royalty, RN since 09/13/2021 12:00 AM  ?  ? ?Problem: Chronic Disease Management Needs   ?Priority: Medium  ?  ? ?Long-Range Goal: Ongoing adherence to established plan of care for long term chronic disease management   ?Start Date: 02/09/2021  ?Expected End Date: 02/09/2022  ?Priority: Medium  ?Note:   ?Current Barriers:  ?Chronic Disease Management support and education needs related to HTN, DMII, and cognitive decline/ impairment ?Patient lives alone- reports limited ability to care for self in setting of progressive cognitive decline with recent Lewy Body diagnosis:  ?07/13/21: patient reports she continues to live alone; however, she is currently staying at her daughter's Janet Le") home in [place], [place]-- Janet Le participates in today's call while phone on speaker mode; patient provides verbal consent today for "anyone at Dr. Eilleen Kempf to speak with daughter Janet Le.  Reports this daughter has been assisting her recently with follow  up appointments and general daily health management.  Janet Le requests that she be contacted for follow up, as patient's phone "does not always work right;" and due to patient forgetting health needs easily.  She also requests that AVS be mailed to her home in [place], as patient is currently residing with her, albeit this may be temporary ?Daughter Janet Le phone number: [phone]; address: [address], [place], [place] [address] ?09/13/21: patient reports she is back at her own her home and continues to live alone; reports local family assisting with care needs as  allowed/ as needed ?Multiple community resource/ social work needs:  CCM CSW active  ?07/13/21: CCM CSW previously involved; signed off 06/20/21 due to inability to maintain contact with patient: today, patient/ d

## 2021-09-13 NOTE — Patient Instructions (Addendum)
Visit Information ? ?Sherise, thank you for taking time to talk with me today. Please don't hesitate to contact me if I can be of assistance to you before our next scheduled telephone appointment ? ?I have asked the [place] to team to contact you by phone to help you set up transportation for your upcoming April 2023 appointments-- please listen out for a phone call from the Care Guide team so you can set up transportation to your bone density test on October 03, 2021 and to your appointment with Dr. Quay Burow on October 20, 2021 ? ?Below are the goals we discussed today:  ?Patient Self-Care Activities: ?Patient [place] will: ?Take medications as prescribed- I am glad you are finding the blister packs for your medications helpful ?Attend all scheduled provider appointments ?Call pharmacy for medication refills ?Call provider office for new concerns or questions ?Maintain communication with CCM team: pharmacy, RN CM and Clinical Social Worker ?Continue to follow heart healthy, low salt, low carbohydrate, low sugar diet ?Please continue to take precautions to prevent falls-- use your cane and/ or walker as needed ? ?Our next scheduled telephone follow up visit/ appointment is scheduled on: Thursday, Nov 02, 2021 at 11:30 am- This is a PHONE [place] appointment ? ?If you need to cancel or re-schedule our visit, please call [phone] and our care guide team will be happy to assist you. ?  ?I look forward to hearing about your progress. ?  ?[name], RN, BSN, CCRN Alumnus ?[place] ?([phone]: direct office ? ?If you are experiencing a Mental Health or [place] or need someone to talk to, please  ?call the Suicide and Crisis Lifeline: 988 ?call the [place] National Suicide Prevention Lifeline: (262)275-9546 or TTY: 534-695-5570 TTY (757)078-2414) to talk to a trained counselor ?call 1-800-273-TALK (toll free, 24 hour hotline) ?go to  [facility] [address] [place] [phone]) ?call 911  ? ?The patient verbalized understanding of instructions, educational materials, and care plan provided today and agreed to receive a mailed copy of patient instructions, educational materials, and care plan ? ?Diabetes Mellitus and Nutrition, Adult ?When you have diabetes, or diabetes mellitus, it is very important to have healthy eating habits because your blood sugar (glucose) levels are greatly affected by what you eat and drink. Eating healthy foods in the right amounts, at about the same times every day, can help you: ?Manage your blood glucose. ?Lower your risk of heart disease. ?Improve your blood pressure. ?Reach or maintain a healthy weight. ?What can affect my meal plan? ?Every person with diabetes is different, and each person has different needs for a meal plan. Your health care provider may recommend that you work with a dietitian to make a meal plan that is best for you. Your meal plan may vary depending on factors such as: ?The calories you need. ?The medicines you take. ?Your weight. ?Your blood glucose, blood pressure, and cholesterol levels. ?Your activity level. ?Other health conditions you have, such as heart or kidney disease. ?How do carbohydrates affect me? ?Carbohydrates, also called carbs, affect your blood glucose level more than any other type of food. Eating carbs raises the amount of glucose in your blood. ?It is important to know how many carbs you can safely have in each meal. This is different for every person. Your dietitian can help you calculate how many carbs you should have at each meal and for each snack. ?  How does alcohol affect me? ?Alcohol can cause a decrease in blood glucose (hypoglycemia), especially if you use insulin or take certain diabetes medicines by mouth. Hypoglycemia can be a life-threatening condition. Symptoms of hypoglycemia, such as sleepiness, dizziness, and  confusion, are similar to symptoms of having too much alcohol. ?Do not drink alcohol if: ?Your health care provider tells you not to drink. ?You are pregnant, may be pregnant, or are planning to become pregnant. ?If you drink alcohol: ?Limit how much you have to: ?0-1 drink a day for women. ?0-2 drinks a day for men. ?Know how much alcohol is in your drink. In the [place] one drink equals one 12 oz bottle of beer (355 mL), one 5 oz glass of wine (148 mL), or one 1? oz glass of hard liquor (44 mL). ?Keep yourself hydrated with water, diet soda, or unsweetened iced tea. Keep in mind that regular soda, juice, and other mixers may contain a lot of sugar and must be counted as carbs. ?What are tips for following this plan? ?Reading food labels ?Start by checking the serving size on the Nutrition Facts label of packaged foods and drinks. The number of calories and the amount of carbs, fats, and other nutrients listed on the label are based on one serving of the item. Many items contain more than one serving per package. ?Check the total grams (g) of carbs in one serving. ?Check the number of grams of saturated fats and trans fats in one serving. Choose foods that have a low amount or none of these fats. ?Check the number of milligrams (mg) of salt (sodium) in one serving. Most people should limit total sodium intake to less than 2,300 mg per day. ?Always check the nutrition information of foods labeled as "low-fat" or "nonfat." These foods may be higher in added sugar or refined carbs and should be avoided. ?Talk to your dietitian to identify your daily goals for nutrients listed on the label. ?Shopping ?Avoid buying canned, pre-made, or processed foods. These foods tend to be high in fat, sodium, and added sugar. ?Shop around the outside edge of the grocery store. This is where you will most often find fresh fruits and vegetables, bulk grains, fresh meats, and fresh dairy products. ?Cooking ?Use low-heat cooking methods,  such as baking, instead of high-heat cooking methods, such as deep frying. ?Cook using healthy oils, such as olive, canola, or sunflower oil. ?Avoid cooking with butter, cream, or high-fat meats. ?Meal planning ?Eat meals and snacks regularly, preferably at the same times every day. Avoid going long periods of time without eating. ?Eat foods that are high in fiber, such as fresh fruits, vegetables, beans, and whole grains. ?Eat 4-6 oz (112-168 g) of lean protein each day, such as lean meat, chicken, fish, eggs, or tofu. One ounce (oz) (28 g) of lean protein is equal to: ?1 oz (28 g) of meat, chicken, or fish. ?1 egg. ?? cup (62 g) of tofu. ?Eat some foods each day that contain healthy fats, such as avocado, nuts, seeds, and fish. ?What foods should I eat? ?Fruits ?Berries. Apples. Oranges. Peaches. Apricots. Plums. Grapes. Mangoes. Papayas. Pomegranates. Kiwi. Cherries. ?Vegetables ?Leafy greens, including lettuce, spinach, kale, chard, collard greens, mustard greens, and cabbage. Beets. Cauliflower. Broccoli. Carrots. Green beans. Tomatoes. Peppers. Onions. Cucumbers. Brussels sprouts. ?Grains ?Whole grains, such as whole-wheat or whole-grain bread, crackers, tortillas, cereal, and pasta. Unsweetened oatmeal. Quinoa. Brown or wild rice. ?Meats and other proteins ?Seafood. Poultry without skin. Lean cuts of poultry  and beef. Tofu. Nuts. Seeds. ?Dairy ?Low-fat or fat-free dairy products such as milk, yogurt, and cheese. ?The items listed above may not be a complete list of foods and beverages you can eat and drink. Contact a dietitian for more information. ?What foods should I avoid? ?Fruits ?Fruits canned with syrup. ?Vegetables ?Canned vegetables. Frozen vegetables with butter or cream sauce. ?Grains ?Refined white flour and flour products such as bread, pasta, snack foods, and cereals. Avoid all processed foods. ?Meats and other proteins ?Fatty cuts of meat. Poultry with skin. Breaded or fried meats. Processed  meat. Avoid saturated fats. ?Dairy ?Full-fat yogurt, cheese, or milk. ?Beverages ?Sweetened drinks, such as soda or iced tea. ?The items listed above may not be a complete list of foods and beverages you sho

## 2021-09-14 ENCOUNTER — Telehealth: Payer: Self-pay

## 2021-09-14 NOTE — Telephone Encounter (Signed)
Pt is requesting for a new home health worker. Pt has had falls in the past and is requesting help with light housekeeping, bathing, assistance with reading mail, meal prep, and errands with groceries or assistance ordering groceries.  ? ?Please advise. ?

## 2021-09-14 NOTE — Telephone Encounter (Signed)
A home health aid is only covered if she has PT or a nursing need.  This HHA would only come a couple of hours 1-2 times a week.  She should or have her daughter call [employer]  - they may offer different coverage than what we can order ?

## 2021-09-14 NOTE — Telephone Encounter (Signed)
? ?  Telephone encounter was:  Successful.  ?09/14/2021 ?Name: Janet Le MRN: [id number] DOB: August 24, 1955 ? ?Janet Le is a 66 y.o. year old female who is a primary care patient of Burns, Claudina Lick, MD . The community resource team was consulted for assistance with Transportation Needs  ? ?Care guide performed the following interventions: Spoke with patient had 3 way call with [facility] to check for transportation benefit. Patient does not have a transportation benefit. [employer] and [employer] application process take close to a month. Daneen Schick is checking to see if there is any other option and will let me know. I let the patient know I would contact her once I find out about transportation. ? ?Follow Up Plan:  Care guide will follow up with patient by phone over the next 7 days ? ?Niklaus Mamaril, [place], CHC ?Care Guide  Embedded Care Coordination ?[facility]  Care Management  ?[address] ?[place], [place] [address] ???[email or web address]  ?? [phone]   ?[email or web address] ?  ?

## 2021-09-15 NOTE — Telephone Encounter (Signed)
Spoke with patient today. ? ?Info given and she will have daughter call ?

## 2021-09-18 ENCOUNTER — Telehealth: Payer: Self-pay

## 2021-09-18 ENCOUNTER — Telehealth: Payer: Self-pay | Admitting: Licensed Clinical Social Worker

## 2021-09-18 ENCOUNTER — Telehealth: Payer: Medicare (Managed Care)

## 2021-09-18 NOTE — Telephone Encounter (Signed)
? ?  Telephone encounter was:  Successful.  ?09/18/2021 ?Name: Janet Le MRN: [id number] DOB: [birth year] ? ?Janet Le is a 66 y.o. year old female who is a primary care patient of Burns, Claudina Lick, MD . The community resource team was consulted for assistance with Transportation Needs  ? ?Care guide performed the following interventions: Received email from Callie Fielding at [facility] transportation she has approved transportation for the April 11 appointment only.  I have attempted to call the patient several times but her voicemail if full and I am unable to leave a message.  ? ?Follow Up Plan:   I will attempt to call the patient again this week. ? ?Lashonna Rieke, [place], [place] ?Care Guide  Embedded Care Coordination ?[facility]  Care Management  ?[address] ?[place], [place] [address] ???[email or web address]  ?? [phone]   ?[email or web address] ?  ?

## 2021-09-18 NOTE — Telephone Encounter (Signed)
? ?  Telephone encounter was:  Successful.  ?09/18/2021 ?Name: Janet Le MRN: [id number] DOB: [birth year] ? ?Janet Le is a 66 y.o. year old female who is a primary care patient of Burns, Claudina Lick, MD . The community resource team was consulted for assistance with Transportation Needs  ? ?Care guide performed the following interventions: Spoke with patient to update her . She stated that she has applied to [employer] in the past but has not used their services for at least 2 years. I told her I would send her a [employer] application to complete. [facility] [employer] concierge is unable to assist patient because she is not a [facility] patient. [employer] transportation is unable to assist until patient has completed a [employer] application and has gone through their process.  We are unable to submit taxi vouchers for patients as this needs to come from the pratice based on current workflow.  ? ?Follow Up Plan:  No further follow up planned at this time. The patient has been provided with needed resources. ? ?Dilyn Osoria, [place], CHC ?Care Guide  Embedded Care Coordination ?  Care Management  ?[address] ?[place], [place] [address] ???[email or web address]  ?? [phone]   ?[email or web address] ?  ?

## 2021-09-18 NOTE — Chronic Care Management (AMB) (Signed)
? ?   Clinical Social Work  ?Care Management  ? Phone Outreach  ? ? ?09/18/2021 ?Name: Janet Le MRN: [id number] DOB: [birth year] ? ?Janet Le is a 66 y.o. year old female who is a primary care patient of Burns, Claudina Lick, MD .  ? ?Reason for referral: Mental Health Counseling and Resources.   ? ?F/U phone call today to assess needs, progress and barriers with care plan goals.   Telephone outreach was unsuccessful. Unable to leave a HIPPA compliant phone message due to voice mail not set up. ? ?Plan:Will route chart to Care Guide to see if patient's caregiver would like to reschedule phone appointment  ? ?Review of patient status, including review of consultants reports, relevant laboratory and other test results, and collaboration with appropriate care team members and the patient's provider was performed as part of comprehensive patient evaluation and provision of care management services.   ? ? ?Casimer Lanius, LCSW ?Licensed Clinical Social Worker [facility]  ?[place]  ?[phone]  ? ?  ? ? ?  ?

## 2021-09-18 NOTE — Chronic Care Management (AMB) (Signed)
? ?   Clinical Social Work  ?Care Management  ? Phone Outreach  ? ? ?09/18/2021 ?Name: KENNIDY LAMKE MRN: [id number] DOB: 10-06-1955 ? ?YANELLY CANTRELLE is a 66 y.o. year old female who is a primary care patient of Burns, Claudina Lick, MD .  ? ?Reason for referral:  CCM support .   ? ?Two attempts made for F/U phone call today to assess needs, progress and barriers with care plan goals.   Telephone outreach was unsuccessful. Unable to leave a HIPPA compliant phone message due to voice mail not set up. ? ?Plan:Will route chart to Care Guide to see if patient's caregiver would like to reschedule phone appointment  ? ?Review of patient status, including review of consultants reports, relevant laboratory and other test results, and collaboration with appropriate care team members and the patient's provider was performed as part of comprehensive patient evaluation and provision of care management services.   ? ? ?Casimer Lanius, LCSW ?Licensed Clinical Social Worker [facility]  ?[place]  ?[phone]  ? ?  ? ? ?  ?

## 2021-09-20 ENCOUNTER — Telehealth: Payer: Self-pay

## 2021-09-20 NOTE — Telephone Encounter (Signed)
? ?  Telephone encounter was:  Successful.  ?09/20/2021 ?Name: NANCYANN COTTERMAN MRN: [id number] DOB: 09/06/1955 ? ?NAKEISHA GREENHOUSE is a 66 y.o. year old female who is a primary care patient of Burns, Claudina Lick, MD . The community resource team was consulted for assistance with Transportation Needs  ? ?Care guide performed the following interventions: Spoke with patient to inform her that [employer] will be providing transportation for her appointment on 10/03/21 at 1pm. I let her know that they were only providing transportation for this appointment and she said she understood. I also told her that a [employer] application has been mailed to her.  ? ?Follow Up Plan:  No further follow up planned at this time. The patient has been provided with needed resources. ? ?Thatiana Renbarger, [place], CHC ?Care Guide  Embedded Care Coordination ?[facility]  Care Management  ?[address] ?[place], [place] [address] ???[email or web address]  ?? [phone]   ?[email or web address] ?  ?

## 2021-09-22 DIAGNOSIS — E1159 Type 2 diabetes mellitus with other circulatory complications: Secondary | ICD-10-CM

## 2021-09-22 DIAGNOSIS — I10 Essential (primary) hypertension: Secondary | ICD-10-CM

## 2021-09-26 ENCOUNTER — Other Ambulatory Visit: Payer: Self-pay | Admitting: Neurology

## 2021-09-29 ENCOUNTER — Telehealth: Payer: Medicare (Managed Care)

## 2021-09-29 NOTE — Progress Notes (Deleted)
? ?Chronic Care Management ?Pharmacy Note ? ?CP 12 minutes *** ? ?09/29/2021 ?Name:  Janet Le MRN:  [id number] DOB:  1955/09/14 ? ?Summary: ?-Pt is struggling to manage her medications due to cognitive impairment ? ?Recommendations/Changes made from today's visit: ?-Utilize [facility] for medication synchronization, packaging and delivery ? ? ? ?Subjective: ?Janet Le is an 66 y.o. year old female who is a primary patient of Burns, Claudina Lick, MD.  The CCM team was consulted for assistance with disease management and care coordination needs.   ? ?Engaged with patient by telephone for initial visit in response to provider referral for pharmacy case management and/or care coordination services.  ? ?Consent to Services:  ?The patient was given the following information about Chronic Care Management services today, agreed to services, and gave verbal consent: 1. CCM service includes personalized support from designated clinical staff supervised by the primary care provider, including individualized plan of care and coordination with other care providers 2. 24/7 contact phone numbers for assistance for urgent and routine care needs. 3. Service will only be billed when office clinical staff spend 20 minutes or more in a month to coordinate care. 4. Only one practitioner may furnish and bill the service in a calendar month. 5.The patient may stop CCM services at any time (effective at the end of the month) by phone call to the office staff. 6. The patient will be responsible for cost sharing (co-pay) of up to 20% of the service fee (after annual deductible is met). Patient agreed to services and consent obtained. ? ?Patient Care Team: ?Binnie Rail, MD as PCP - General (Internal Medicine) ?Sable Feil, MD as Consulting Physician (Gastroenterology) ?Larey Dresser, MD as Consulting Physician (Cardiology) ?Izora Gala, MD as Consulting Physician (Otolaryngology) ?Jeannett Senior as  Physician Assistant (Orthopedic Surgery) ?Juanda Chance, NP as Nurse Practitioner (Obstetrics and Gynecology) ?Franchot Gallo, MD as Consulting Physician (Urology) ?Aletta Edouard, MD as Consulting Physician (Radiology) ?Sharol Roussel, OD (Optometry) ?Pieter Partridge, DO (Neurology) ?Kirsteins, Luanna Salk, MD (Physical Medicine and Rehabilitation) ?Knox Royalty, RN as Case Manager ?Foltanski, Cleaster Corin, [facility] as Pharmacist (Pharmacist) ?Maurine Cane, LCSW as Education officer, museum (Licensed Holiday representative) ? ?Recent office visits: ?07/03/2021 - Dr. Quay Burow - referred to nutrition - linzess 143mg daily - follow up in 6 months  ?04/21/2021 - Dr. BQuay Burow- dexa scan ordered - no changes to medications - f/u in 6 months  ? ?Recent consult visits: ?09/07/2021 - Dr. JTomi Likens- Neurology - increase rivastigmine to 4.546mBID - f/u in 6 months  ? ?Hospital visits: ?None in previous 6 months ? ? ?Objective: ? ?Lab Results  ?Component Value Date  ? CREATININE 0.91 07/03/2021  ? BUN 12 07/03/2021  ? GFR 66.00 07/03/2021  ? GFRNONAA 78 03/05/2018  ? GFRAA 90 03/05/2018  ? NA 140 07/03/2021  ? K 3.8 07/03/2021  ? CALCIUM 10.0 07/03/2021  ? CO2 27 07/03/2021  ? GLUCOSE 89 07/03/2021  ? ? ?Lab Results  ?Component Value Date/Time  ? HGBA1C 5.7 07/03/2021 03:11 PM  ? HGBA1C 5.4 04/21/2021 03:40 PM  ? GFR 66.00 07/03/2021 03:11 PM  ? GFR 73.81 04/21/2021 03:40 PM  ? MICROALBUR 1.0 01/05/2015 11:40 AM  ? MICROALBUR 0.8 01/07/2013 10:11 AM  ?  ?Last diabetic Eye exam:  ?Lab Results  ?Component Value Date/Time  ? HMDIABEYEEXA No Retinopathy 08/31/2016 12:00 AM  ?  ?Last diabetic Foot exam: No results found for: HMDIABFOOTEX  ? ?Lab  Results  ?Component Value Date  ? CHOL 168 07/03/2021  ? HDL 59.80 07/03/2021  ? [place] 94 07/03/2021  ? LDLDIRECT 143.8 07/01/2012  ? TRIG 71.0 07/03/2021  ? CHOLHDL 3 07/03/2021  ? ? ? ?  Latest Ref Rng & Units 07/03/2021  ?  3:11 PM 04/21/2021  ?  3:40 PM 11/02/2020  ?  4:36 PM  ?Hepatic Function  ?Total Protein  6.0 - 8.3 g/dL 7.1   7.0   7.0    ?Albumin 3.5 - 5.2 g/dL 3.9   3.8     ?AST 0 - 37 U/L 19   47     ?ALT 0 - 35 U/L 13   58     ?Alk Phosphatase 39 - 117 U/L 96   98     ?Total Bilirubin 0.2 - 1.2 mg/dL 0.4   0.6     ? ? ?Lab Results  ?Component Value Date/Time  ? TSH 2.46 04/21/2021 03:40 PM  ? TSH 1.97 09/07/2020 12:42 PM  ? ? ? ?  Latest Ref Rng & Units 07/03/2021  ?  3:11 PM 04/21/2021  ?  3:40 PM 09/07/2020  ? 12:42 PM  ?CBC  ?WBC 4.0 - 10.5 K/uL 8.1   6.0   8.4    ?Hemoglobin 12.0 - 15.0 g/dL 12.6   12.0   12.6    ?Hematocrit 36.0 - 46.0 % 38.6   36.2   37.6    ?Platelets 150.0 - 400.0 K/uL 125.0   146.0   152.0    ? ? ?Lab Results  ?Component Value Date/Time  ? VD25OH 46.7 03/05/2018 10:40 AM  ? ? ?Clinical ASCVD: No  ?The 10-year ASCVD risk score (Arnett DK, et al., 2019) is: 12.5% ?  Values used to calculate the score: ?    Age: 38 years ?    Sex: Female ?    Is Non-Hispanic African American: Yes ?    Diabetic: Yes ?    Tobacco smoker: No ?    Systolic Blood Pressure: 983 mmHg ?    Is BP treated: Yes ?    HDL Cholesterol: 59.8 mg/dL ?    Total Cholesterol: 168 mg/dL   ? ? ?  08/30/2021  ? 11:11 AM 01/16/2021  ? 12:32 PM 07/12/2020  ? 11:33 AM  ?Depression screen PHQ 2/9  ?Decreased Interest 0 3 3  ?Down, Depressed, Hopeless 0 1 2  ?PHQ - 2 Score 0 4 5  ?Altered sleeping  3 3  ?Tired, decreased energy  3 3  ?Change in appetite  0 2  ?Feeling bad or failure about yourself   3 2  ?Trouble concentrating  3 3  ?Moving slowly or fidgety/restless  0 2  ?Suicidal thoughts  0 0  ?PHQ-9 Score  16 20  ?Difficult doing work/chores  Very difficult Somewhat difficult  ?  ? ?  03/17/2018  ?  2:50 PM  ?GAD 7 : Generalized Anxiety Score  ?Nervous, Anxious, on Edge 3  ?Control/stop worrying 3  ?Worry too much - different things 3  ?Trouble relaxing 3  ?Restless 0  ?Easily annoyed or irritable 3  ?Afraid - awful might happen 0  ?Total GAD 7 Score 15  ?Anxiety Difficulty Very difficult  ? ? ? ?Social History  ? ?Tobacco Use  ?Smoking  Status Never  ?Smokeless Tobacco Never  ? ?BP Readings from Last 3 Encounters:  ?09/07/21 107/70  ?07/03/21 134/78  ?04/21/21 128/82  ? ?Pulse Readings from Last 3 Encounters:  ?  09/07/21 68  ?07/03/21 83  ?04/21/21 63  ? ?Wt Readings from Last 3 Encounters:  ?09/07/21 220 lb (99.8 kg)  ?07/03/21 224 lb (101.6 kg)  ?04/21/21 233 lb (105.7 kg)  ? ?BMI Readings from Last 3 Encounters:  ?09/07/21 40.24 kg/m?  ?07/03/21 40.97 kg/m?  ?04/21/21 42.62 kg/m?  ? ? ?Assessment/Interventions: Review of patient past medical history, allergies, medications, health status, including review of consultants reports, laboratory and other test data, was performed as part of comprehensive evaluation and provision of chronic care management services.  ? ?SDOH:  (Social Determinants of Health) assessments and interventions performed: Yes ? ?SDOH Screenings  ? ?Alcohol Screen: Not on file  ?Depression (PHQ2-9): Low Risk   ? PHQ-2 Score: 0  ?Financial Resource Strain: Low Risk   ? Difficulty of Paying Living Expenses: Not hard at all  ?Food Insecurity: No Food Insecurity  ? Worried About Charity fundraiser in the Last Year: Never true  ? Ran Out of Food in the Last Year: Never true  ?Housing: Low Risk   ? Last Housing Risk Score: 0  ?Physical Activity: Not on file  ?Social Connections: Not on file  ?Stress: Not on file  ?Tobacco Use: Low Risk   ? Smoking Tobacco Use: Never  ? Smokeless Tobacco Use: Never  ? Passive Exposure: Not on file  ?Transportation Needs: Unmet Transportation Needs  ? Lack of Transportation (Medical): Yes  ? Lack of Transportation (Non-Medical): Yes  ? ? ?[place] ? ?Allergies  ?Allergen Reactions  ? Sertraline Hcl Other (See Comments)  ?  Spasms; numbness  ? Ace Inhibitors Cough  ? Codeine Palpitations  ? Darifenacin Hydrobromide Er Other (See Comments)  ?  Pt states made her feel queezy & drunk  ? Gabapentin Other (See Comments)  ?  Hallucinations  ? Pravastatin Other (See Comments)  ?  myalgias  ? Propoxyphene  Hcl Palpitations  ? Wellbutrin [Bupropion Hcl] Other (See Comments)  ?  Numbness of mouth/lips  ? ? ?Medications Reviewed Today   ? ? Reviewed by Knox Royalty, RN (Registered Nurse) on 09/13/21 at 1132  Me

## 2021-10-03 ENCOUNTER — Inpatient Hospital Stay: Admission: RE | Admit: 2021-10-03 | Payer: Medicare (Managed Care) | Source: Ambulatory Visit

## 2021-10-09 ENCOUNTER — Other Ambulatory Visit: Payer: Self-pay | Admitting: Internal Medicine

## 2021-10-10 ENCOUNTER — Encounter: Payer: Self-pay | Admitting: Internal Medicine

## 2021-10-10 NOTE — Progress Notes (Addendum)
Subjective:    Patient ID: Janet Le, female    DOB: September 02, 1955, 66 y.o.   MRN: [id number]  This visit occurred during the SARS-CoV-2 public health emergency.  Safety protocols were in place, including screening questions prior to the visit, additional usage of staff PPE, and extensive cleaning of exam room while observing appropriate contact time as indicated for disinfecting solutions.    HPI Elyce is here for  Chief Complaint  Patient presents with   sleeping issues   Possible bruise from fall    Located under breast area   Swelling    Right foot and leg pain. Patient also complains of hip pain   She is here with her daughter Lenna Sciara.   Situation - saw rain in her house but no water on floor.  Also came home and things were not where they were supposed to be.     Sleeping issues - she gets anxious and paranoid when alone.  One of her family members is there most of the time - now it is her grandson, but he is usually there during the day and not at night.  She has been sleeping during the day and up all night.  She gets anxious.  She can hear everything - people in the walls, etc.     Having paranoia and nightmares.  Better when family is around.  Not getting sleep at night.    SCAT forms today, handicap parking sticker.  She is not driving, but her family drives her.    Constipation - alt linzess and miralax. Has bloating and gurgling.     She does have difficulty with mobility.  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  All of these issues impair her ability to perform daily activities like toileting, bathing, dressing, grooming and bathing.  She requires a walker ideally with a seat so that she can do her daily activities, get to appointments but have the opportunity to rest when needed.  She is requesting a rollator, which I think is medically necessary to help prevent falls and allow her to do her  ADLs.    Medications and allergies reviewed with patient and updated if appropriate.  Current Outpatient Medications on File Prior to Visit  Medication Sig Dispense Refill   Accu-Chek FastClix Lancets MISC USE AS DIRECTED UP  TO  1 TIME  DAILY- DX CODE R73.03 100 each 1   acetaminophen (TYLENOL) 325 MG tablet Take 1,300 mg by mouth at bedtime.     amLODipine (NORVASC) 5 MG tablet TAKE ONE TABLET BY MOUTH ONCE DAILY 90 tablet 0   atorvastatin (LIPITOR) 10 MG tablet TAKE ONE TABLET BY MOUTH ONCE DAILY 90 tablet 0   Blood Glucose Monitoring Suppl (ACCU-CHEK GUIDE ME) w/Device KIT Use to check blood sugar daily 1 kit 0   calcium carbonate (OS-CAL) 600 MG TABS tablet Take 600 mg by mouth 2 (two) times daily with a meal.     clotrimazole (LOTRIMIN) 1 % cream SMARTSIG:1 Topical Daily PRN     dicyclomine (BENTYL) 10 MG capsule TAKE 1 TO 2 CAPSULES BY MOUTH three times daily AS NEEDED 360 capsule 1   DULoxetine (CYMBALTA) 60 MG capsule TAKE ONE CAPSULE BY MOUTH ONCE DAILY 90 capsule 1   Elastic Bandages & Supports (MEDICAL COMPRESSION SOCKS) MISC Compression sock, medium compression 20-30 mmHg. Use daily for venous insufficiency X52.8 3 each 0   folic acid (FOLVITE) 1 MG tablet Take 1 tablet (  1 mg total) by mouth daily. 90 tablet 3   furosemide (LASIX) 20 MG tablet TAKE ONE TABLET BY MOUTH DAILY AT 9AM 60 tablet 0   glucose blood (ACCU-CHEK GUIDE) test strip Use to check sugars once daily. Dx Code-R73.03 100 each 1   Incontinence Supply Disposable (COMFORT PROTECT ADULT DIAPER/L) MISC Use as directed for urinary incontinence 90 each 5   linaclotide (LINZESS) 145 MCG CAPS capsule Take 1 capsule (145 mcg total) by mouth daily. 90 capsule 3   methotrexate 2.5 MG tablet Take 10 mg by mouth once a week. Mondays     Misc Natural Products (HEALTHY LIVER PO) Take by mouth.     Multiple Vitamin (MULTIVITAMIN) tablet Take 1 tablet by mouth daily.     polyethylene glycol powder (GLYCOLAX/MIRALAX) 17 GM/SCOOP  powder Take 17 g by mouth daily as needed for moderate constipation. 255 g 11   propranolol (INDERAL) 80 MG tablet TAKE ONE TABLET BY MOUTH TWICE DAILY 180 tablet 0   rivastigmine (EXELON) 4.5 MG capsule Take 1 capsule (4.5 mg total) by mouth 2 (two) times daily. 60 capsule 5   telmisartan (MICARDIS) 80 MG tablet TAKE ONE TABLET BY MOUTH ONCE DAILY 90 tablet 0   No current facility-administered medications on file prior to visit.    Review of Systems  Constitutional:  Negative for fever.  Respiratory:  Positive for cough (allergy related). Negative for shortness of breath and wheezing.   Cardiovascular:  Positive for leg swelling. Negative for chest pain and palpitations.  Musculoskeletal:  Positive for arthralgias and back pain.  Neurological:  Positive for headaches (occ). Negative for dizziness.      Objective:   Vitals:   10/11/21 1503  Pulse: 67  SpO2: 99%   BP Readings from Last 3 Encounters:  09/07/21 107/70  07/03/21 134/78  04/21/21 128/82   Wt Readings from Last 3 Encounters:  10/11/21 222 lb (100.7 kg)  09/07/21 220 lb (99.8 kg)  07/03/21 224 lb (101.6 kg)   Body mass index is 40.6 kg/m.    Physical Exam Constitutional:      General: She is not in acute distress.    Appearance: Normal appearance.  HENT:     Head: Normocephalic and atraumatic.  Eyes:     Conjunctiva/sclera: Conjunctivae normal.  Cardiovascular:     Rate and Rhythm: Normal rate and regular rhythm.     Heart sounds: Normal heart sounds. No murmur heard. Pulmonary:     Effort: Pulmonary effort is normal. No respiratory distress.     Breath sounds: Normal breath sounds. No wheezing.  Musculoskeletal:     Cervical back: Neck supple.     Right lower leg: No edema.     Left lower leg: No edema.  Lymphadenopathy:     Cervical: No cervical adenopathy.  Skin:    General: Skin is warm and dry.     Findings: No rash.  Neurological:     Mental Status: She is alert. Mental status is at  baseline.  Psychiatric:        Mood and Affect: Mood normal.        Behavior: Behavior normal.           Assessment & Plan:    See Problem List for Assessment and Plan of chronic medical problems.

## 2021-10-11 ENCOUNTER — Ambulatory Visit (INDEPENDENT_AMBULATORY_CARE_PROVIDER_SITE_OTHER): Payer: 59 | Admitting: Internal Medicine

## 2021-10-11 DIAGNOSIS — G3183 Dementia with Lewy bodies: Secondary | ICD-10-CM | POA: Diagnosis not present

## 2021-10-11 DIAGNOSIS — I10 Essential (primary) hypertension: Secondary | ICD-10-CM

## 2021-10-11 DIAGNOSIS — F02A0 Dementia in other diseases classified elsewhere, mild, without behavioral disturbance, psychotic disturbance, mood disturbance, and anxiety: Secondary | ICD-10-CM

## 2021-10-11 DIAGNOSIS — F411 Generalized anxiety disorder: Secondary | ICD-10-CM

## 2021-10-11 MED ORDER — FLUTICASONE PROPIONATE 50 MCG/ACT NA SUSP: 2.0000 | Freq: Every day | NASAL | 6 refills | Status: DC

## 2021-10-11 MED ORDER — DICYCLOMINE HCL 10 MG PO CAPS: ORAL_CAPSULE | ORAL | 1 refills | Status: DC

## 2021-10-11 NOTE — Patient Instructions (Addendum)
? ? ?  Medications changes include :   none ? ? ?Your prescription(s) have been sent to your pharmacy.  ? ? ?

## 2021-10-11 NOTE — Assessment & Plan Note (Signed)
Chronic ?BP slightly elevated here today but acceptable  ?Continue amlodipine 5 mg daily, propranolol 80 mg bid, telmisartan 80 mg daily ?

## 2021-10-11 NOTE — Assessment & Plan Note (Signed)
Chronic ?Not ideally controlled ?Continue cymbalta 60 mg daily ?Will consider starting seroquel  ?

## 2021-10-11 NOTE — Assessment & Plan Note (Signed)
Chronic ?Having anxiety, paranoia, nightmares, sleep issues ?Will consider seroquel - will see if Dr Tomi Likens agrees or has other recommendation ?Family trying to be with her most of the time - she does much better when she is not alone ?

## 2021-10-12 ENCOUNTER — Other Ambulatory Visit: Payer: Self-pay | Admitting: Internal Medicine

## 2021-10-12 ENCOUNTER — Telehealth (HOSPITAL_COMMUNITY): Payer: 59 | Admitting: Psychiatry

## 2021-10-12 ENCOUNTER — Telehealth: Payer: Self-pay | Admitting: Internal Medicine

## 2021-10-12 MED ORDER — QUETIAPINE FUMARATE 25 MG PO TABS: 25.0000 mg | ORAL_TABLET | Freq: Every day | ORAL | 5 refills | Status: DC

## 2021-10-12 NOTE — Telephone Encounter (Signed)
Please call her - I discussed with Dr Tomi Likens - it is ok to take seroquel at night - this should help with sleep.  We will start with a low dose and increase if needed.  Medication pending - not sure where to send. ?

## 2021-10-12 NOTE — Telephone Encounter (Signed)
Her daughter asked about an as needed medication for anxiety - lets see how the seroquel does. Most as needed medications can cause worsening of memory, dizziness and drowsiness. ?

## 2021-10-13 NOTE — Telephone Encounter (Signed)
Letter printed- ? ? ?Also should be available on MyChart ?

## 2021-10-16 NOTE — Telephone Encounter (Signed)
Spoke with patient today. ? ?Letter mailed out. ?

## 2021-10-19 NOTE — Progress Notes (Signed)
Subjective:    Patient ID: Janet Le, female    DOB: February 24, 1956, 66 y.o.   MRN: [id number]  This visit occurred during the SARS-CoV-2 public health emergency.  Safety protocols were in place, including screening questions prior to the visit, additional usage of staff PPE, and extensive cleaning of exam room while observing appropriate contact time as indicated for disinfecting solutions.     HPI Ravin is here for follow up of her chronic medical problems, including sleeping issues, htn, DM, hld, lewy body dementia, anxiety, depression, IBS w/ constipation    Medications and allergies reviewed with patient and updated if appropriate.  Current Outpatient Medications on File Prior to Visit  Medication Sig Dispense Refill   Accu-Chek FastClix Lancets MISC USE AS DIRECTED UP  TO  1 TIME  DAILY- DX CODE R73.03 100 each 1   acetaminophen (TYLENOL) 325 MG tablet Take 1,300 mg by mouth at bedtime.     amLODipine (NORVASC) 5 MG tablet TAKE ONE TABLET BY MOUTH ONCE DAILY 90 tablet 0   atorvastatin (LIPITOR) 10 MG tablet TAKE ONE TABLET BY MOUTH ONCE DAILY 90 tablet 0   Blood Glucose Monitoring Suppl (ACCU-CHEK GUIDE ME) w/Device KIT Use to check blood sugar daily 1 kit 0   calcium carbonate (OS-CAL) 600 MG TABS tablet Take 600 mg by mouth 2 (two) times daily with a meal.     clotrimazole (LOTRIMIN) 1 % cream SMARTSIG:1 Topical Daily PRN     dicyclomine (BENTYL) 10 MG capsule Patient would like Rx 110moat a time 360 capsule 1   DULoxetine (CYMBALTA) 60 MG capsule TAKE ONE CAPSULE BY MOUTH ONCE DAILY 90 capsule 1   Elastic Bandages & Supports (MEDICAL COMPRESSION SOCKS) MISC Compression sock, medium compression 20-30 mmHg. Use daily for venous insufficiency I87.2 3 each 0   fluticasone (FLONASE) 50 MCG/ACT nasal spray Place 2 sprays into both nostrils daily. 16 g 6   folic acid (FOLVITE) 1 MG tablet Take 1 tablet (1 mg total) by mouth daily. 90 tablet 3   furosemide (LASIX) 20 MG tablet  TAKE ONE TABLET BY MOUTH DAILY AT 9AM 60 tablet 0   glucose blood (ACCU-CHEK GUIDE) test strip Use to check sugars once daily. Dx Code-R73.03 100 each 1   Incontinence Supply Disposable (COMFORT PROTECT ADULT DIAPER/L) MISC Use as directed for urinary incontinence 90 each 5   linaclotide (LINZESS) 145 MCG CAPS capsule Take 1 capsule (145 mcg total) by mouth daily. 90 capsule 3   methotrexate 2.5 MG tablet Take 10 mg by mouth once a week. Mondays     Misc Natural Products (HEALTHY LIVER PO) Take by mouth.     Multiple Vitamin (MULTIVITAMIN) tablet Take 1 tablet by mouth daily.     polyethylene glycol powder (GLYCOLAX/MIRALAX) 17 GM/SCOOP powder Take 17 g by mouth daily as needed for moderate constipation. 255 g 11   propranolol (INDERAL) 80 MG tablet TAKE ONE TABLET BY MOUTH TWICE DAILY 180 tablet 0   QUEtiapine (SEROQUEL) 25 MG tablet Take 1 tablet (25 mg total) by mouth at bedtime. 30 tablet 5   rivastigmine (EXELON) 4.5 MG capsule Take 1 capsule (4.5 mg total) by mouth 2 (two) times daily. 60 capsule 5   telmisartan (MICARDIS) 80 MG tablet TAKE ONE TABLET BY MOUTH ONCE DAILY 90 tablet 0   No current facility-administered medications on file prior to visit.     Review of Systems     Objective:  There were no  vitals filed for this visit. BP Readings from Last 3 Encounters:  10/11/21 (!) 142/94  09/07/21 107/70  07/03/21 134/78   Wt Readings from Last 3 Encounters:  10/11/21 222 lb (100.7 kg)  09/07/21 220 lb (99.8 kg)  07/03/21 224 lb (101.6 kg)   There is no height or weight on file to calculate BMI.    Physical Exam     Lab Results  Component Value Date   WBC 8.1 07/03/2021   HGB 12.6 07/03/2021   HCT 38.6 07/03/2021   PLT 125.0 (L) 07/03/2021   GLUCOSE 89 07/03/2021   CHOL 168 07/03/2021   TRIG 71.0 07/03/2021   HDL 59.80 07/03/2021   LDLDIRECT 143.8 07/01/2012   LDLCALC 94 07/03/2021   ALT 13 07/03/2021   AST 19 07/03/2021   NA 140 07/03/2021   K 3.8  07/03/2021   CL 107 07/03/2021   CREATININE 0.91 07/03/2021   BUN 12 07/03/2021   CO2 27 07/03/2021   TSH 2.46 04/21/2021   INR 0.91 02/08/2011   HGBA1C 5.7 07/03/2021   MICROALBUR 1.0 01/05/2015     Assessment & Plan:    See Problem List for Assessment and Plan of chronic medical problems.   This encounter was created in error - please disregard.

## 2021-10-19 NOTE — Patient Instructions (Addendum)
? ? ? ?  Blood work was ordered.   ? ? ?Medications changes include :    ? ? ?Your prescription(s) have been sent to your pharmacy.  ? ? ?A referral was ordered for XX.     Someone from that office will call you to schedule an appointment.  ? ? ?Return in about 6 months (around 04/21/2022) for 6 month f/u. ? ?

## 2021-10-20 ENCOUNTER — Encounter: Payer: Medicare (Managed Care) | Admitting: Internal Medicine

## 2021-10-20 DIAGNOSIS — F331 Major depressive disorder, recurrent, moderate: Secondary | ICD-10-CM

## 2021-10-20 DIAGNOSIS — E782 Mixed hyperlipidemia: Secondary | ICD-10-CM

## 2021-10-20 DIAGNOSIS — E119 Type 2 diabetes mellitus without complications: Secondary | ICD-10-CM

## 2021-10-20 DIAGNOSIS — I10 Essential (primary) hypertension: Secondary | ICD-10-CM

## 2021-10-20 DIAGNOSIS — F411 Generalized anxiety disorder: Secondary | ICD-10-CM

## 2021-10-21 ENCOUNTER — Other Ambulatory Visit: Payer: Self-pay | Admitting: Internal Medicine

## 2021-10-23 ENCOUNTER — Telehealth: Payer: Self-pay | Admitting: Internal Medicine

## 2021-10-23 MED ORDER — DICYCLOMINE HCL 10 MG PO CAPS: 10.0000 mg | ORAL_CAPSULE | Freq: Two times a day (BID) | ORAL | 1 refills | Status: DC

## 2021-10-23 NOTE — Telephone Encounter (Signed)
Refill sent in by Dr. Quay Burow ?

## 2021-10-23 NOTE — Telephone Encounter (Signed)
Pharmacy needs clarification on rx on Dicyclomine. ?

## 2021-10-24 ENCOUNTER — Other Ambulatory Visit: Payer: Self-pay | Admitting: Internal Medicine

## 2021-10-24 ENCOUNTER — Telehealth: Payer: Self-pay | Admitting: Internal Medicine

## 2021-10-24 ENCOUNTER — Ambulatory Visit (INDEPENDENT_AMBULATORY_CARE_PROVIDER_SITE_OTHER): Payer: 59 | Admitting: Licensed Clinical Social Worker

## 2021-10-24 DIAGNOSIS — I10 Essential (primary) hypertension: Secondary | ICD-10-CM

## 2021-10-24 DIAGNOSIS — F331 Major depressive disorder, recurrent, moderate: Secondary | ICD-10-CM

## 2021-10-24 NOTE — Patient Instructions (Addendum)
Visit Information ? ?Thank you for taking time to visit with me today. Please don't hesitate to contact me if I can be of assistance to you before our next scheduled telephone appointment. ? ?Following are the goals we discussed today: community support ?Patient Self-Care Activities: ?Call Department of Social Service to inquire about the in-home aide program [phone] ( please remember they have a wait list ? ?Please call the care guide team at [phone] if you need to cancel or reschedule your appointment.  ? ?If you are experiencing a Mental Health or [facility] or need someone to talk to, please call [phone]-TALK (toll free, 24 hour hotline)  ? ?Patient verbalizes understanding of instructions and care plan provided today and agrees to view in [place]. Active MyChart status confirmed with patient.   ? ?Casimer Lanius, LCSW ?Licensed Clinical Social Worker [facility]  ?[place]  ?[phone]  ?

## 2021-10-24 NOTE — Telephone Encounter (Signed)
Stop medication - I would recommend ask Dr Tomi Likens what he recommends ?

## 2021-10-24 NOTE — Telephone Encounter (Signed)
Spoke with patient and info given 

## 2021-10-24 NOTE — Chronic Care Management (AMB) (Signed)
?Chronic Care Management  ? Clinical Social Work Note ? ?10/24/2021 ?Name: Janet Le MRN: [id number] DOB: 12-Dec-1955 ? ?Janet Le is a 66 y.o. year old female who is a primary care patient of Burns, Claudina Lick, MD. The CCM team was consulted to assist the patient with chronic disease management and/or care coordination needs related to: [employer] .  ? ?Engaged with patient by telephone for follow up visit in response to provider referral for social work chronic care management and care coordination services.  ? ?Consent to Services:  ?The patient was given information about Chronic Care Management services, agreed to services, and gave verbal consent prior to initiation of services.  Please see initial visit note for detailed documentation.  ? ?Patient agreed to services and consent obtained.  ? ?Summary:  Patient continues to express concern of wanting to live in a neighborhood with other seniors.  She does not remember if she received information the Care guide. Reports not feeling well today.  ?Reminded patient of of all up coming appointments.  See Care Plan below for interventions and patient self-care actives. ? ?Recommendation: Patient may benefit from, and is in agreement for LCSW to transfer her to PCP's office to express concerns about the way the medication makes her feel. Patient will continue working with CCM RN who will consult with LCSW as needed. ? ?Follow up Plan:  ?CCM LCSW will continue to collaborate with CCM RN in order to meet patient's needs . ?No follow up scheduled with CCM LCSW at this time. Patient will call office if needed. ? ?Assessment: Review of patient past medical history, allergies, medications, and health status, including review of relevant consultants reports was performed today as part of a comprehensive evaluation and provision of chronic care management and care coordination services.    ? ?SDOH (Social Determinants of Health) assessments and interventions  performed:   ? ?Advanced Directives Status: Not addressed in this encounter. ? ?CCM Care Plan ?Conditions to be addressed/monitored: ; Limited social support and Transportation ? ?Care Plan : LCSW Plan of Care  ?Updates made by Maurine Cane, LCSW since 10/24/2021 12:00 AM  ?  ? ?Problem: Long-Term Care Planning and support   ?  ? ?Goal: Effective Long-Term Care Planning for mental health support   ?Start Date: 07/18/2021  ?Recent Progress: On track  ?Priority: High  ?Note:   ?Current Barriers:  ?Level of Care Concerns:Inability to perform ADL's independently and Memory Deficits ?SDOH Furniture conservator/restorer and housing information) ? ?CSW Clinical Goal(s):  ?Patient  and Caregiver  will work with agencies discussed and LCSW to address needs related to level of care  through collaboration with [facility] representative, provider, and care team.  ? ?Interventions: ?1:1 collaboration with primary care provider regarding development and update of comprehensive plan of care as evidenced by provider attestation and co-signature ?Inter-disciplinary care team collaboration (see longitudinal plan of care) ?Evaluation of current treatment plan related to  self management and patient's adherence to plan as established by provider ?Review resources, discussed options and provided patient information about  ?Department of Social Services ( food stamps and [place] ) ?Transportation provided by insurance provider ?Enhanced Benefits connected with insurance provider  ?Referral to care guide (meals on wheels and housing resource)  ?Personal Care Services [facility]) :Does not have Medicaid ?Private pay options for personal care needs :unable to afford ?Meals on wheels (Care guide to assist with) ?Facility placement process :Not interested ? ?Level of Care Concerns in a patient with  Anxiety and mild cognitive disorder  :  (Status: No Needs Identified this visit) ?Current level of care: home, alone and temp staying with daughter in  [place] ?Evaluation of patient safety in current living environment ?Solution-Focused Strategies employed:  ? ?Mental Health:  (Status: Goal on Track (progressing): YES.) ?Evaluation of current treatment plan related to Anxiety with Excessive Worry, and symptoms of depression ?Active listening / Reflection utilized  ?Reviewed mental health medications and discussed importance of compliance: currently taking cymbalta thinks it needs to be increased   ?Previous referral made to [facility] for therapy and [facility] for medication  management/ appointment 09/2021 canceled ?Patient has Nov. Appointments with [facility] ? ?Patient Self-Care Activities: ?Call Department of Social Service to inquire about the in-home aide program [phone] ( please remember they have a wait list) ? ?  ? ?Casimer Lanius, LCSW ?Licensed Clinical Social Worker [facility]  ?[place]  ?[phone]  ?

## 2021-10-24 NOTE — Telephone Encounter (Signed)
Pt statesQUEtiapine (SEROQUEL) 25 MG tablet has caused her to wake up "screaming and yelling" throughout the night on 5-1 ? ?Pt states she has only taking one dose on 5-1 ? ?Pt requesting a cb w/ recommendations ?

## 2021-10-25 ENCOUNTER — Telehealth: Payer: Self-pay | Admitting: Neurology

## 2021-10-25 MED ORDER — QUETIAPINE FUMARATE 50 MG PO TABS: 50.0000 mg | ORAL_TABLET | Freq: Every day | ORAL | 1 refills | Status: DC

## 2021-10-25 NOTE — Telephone Encounter (Signed)
Pt stated her PCP stacy burns put her on quetiapine '25mg'$  to help her sleep but it is not helping her. Marzetta Board was to talk with jaffe about this and get in touch with her but she has not heard anything  ?

## 2021-10-25 NOTE — Telephone Encounter (Signed)
PER note 10/24/21  ?Stay Burns, Stop medication - I would recommend ask Dr Tomi Likens what he recommends ?

## 2021-10-25 NOTE — Telephone Encounter (Signed)
Per Dr.Jaffe,  ?Increase quetiapine to '50mg'$  at bedtime   ? ?

## 2021-10-26 ENCOUNTER — Other Ambulatory Visit: Payer: Self-pay | Admitting: Internal Medicine

## 2021-10-31 ENCOUNTER — Other Ambulatory Visit: Payer: Self-pay | Admitting: Internal Medicine

## 2021-11-02 ENCOUNTER — Ambulatory Visit: Payer: Medicare (Managed Care) | Admitting: *Deleted

## 2021-11-02 DIAGNOSIS — I10 Essential (primary) hypertension: Secondary | ICD-10-CM

## 2021-11-02 DIAGNOSIS — F02A0 Dementia in other diseases classified elsewhere, mild, without behavioral disturbance, psychotic disturbance, mood disturbance, and anxiety: Secondary | ICD-10-CM

## 2021-11-02 DIAGNOSIS — E119 Type 2 diabetes mellitus without complications: Secondary | ICD-10-CM

## 2021-11-02 NOTE — Patient Instructions (Signed)
Visit Information ? ?[place], thank you for taking time to talk with me today. Please don't hesitate to contact me if I can be of assistance to you before our next scheduled telephone appointment ? ?Below are the goals we discussed today:  ?Patient Self-Care Activities: ?Patient [place] will: ?Take medications as prescribed ?Attend all scheduled provider appointments ?Call pharmacy for medication refills ?Call provider office for new concerns or questions ?Maintain communication with CCM team: pharmacy, RN CM and Clinical Social Worker ?Continue to follow heart healthy, low salt, low carbohydrate, low sugar diet ?Please continue to take precautions to prevent falls-- use your cane and/ or walker as needed ?If you continue to experience concerns around not being able to sleep, please contact Dr. Quay Burow or Dr. Tomi Likens to discuss ?Please start using SCAT services for transportation as soon as you are able ? ?Our next scheduled telephone follow up visit/ appointment is scheduled on: Thursday, December 28, 2021 at 11:30 am- This is a PHONE CALL appointment ? ?If you need to cancel or re-schedule our visit, please call [phone] and our care guide team will be happy to assist you. ?  ?I look forward to hearing about your progress. ?  ?[name], RN, BSN, CCRN Alumnus ?[place] ?([phone]: direct office ? ?If you are experiencing a Mental Health or [place] or need someone to talk to, please  ?call the Suicide and Crisis Lifeline: 988 ?call the [place] National Suicide Prevention Lifeline: 9012826532 or TTY: (478)059-5770 TTY 662-594-3088) to talk to a trained counselor ?call 1-800-273-TALK (toll free, 24 hour hotline) ?go to [facility] [address] [place] [phone]) ?call 911  ? ?The patient verbalized understanding of instructions, educational materials, and care plan provided today and agreed to receive  a mailed copy of patient instructions, educational materials, and care plan ? ?Health Maintenance After Age 34 ?After age 40, you are at a higher risk for certain long-term diseases and infections as well as injuries from falls. Falls are a major cause of broken bones and head injuries in people who are older than age 59. Getting regular preventive care can help to keep you healthy and well. Preventive care includes getting regular testing and making lifestyle changes as recommended by your health care provider. Talk with your health care provider about: ?Which screenings and tests you should have. A screening is a test that checks for a disease when you have no symptoms. ?A diet and exercise plan that is right for you. ?What should I know about screenings and tests to prevent falls? ?Screening and testing are the best ways to find a health problem early. Early diagnosis and treatment give you the best chance of managing medical conditions that are common after age 56. Certain conditions and lifestyle choices may make you more likely to have a fall. Your health care provider may recommend: ?Regular vision checks. Poor vision and conditions such as cataracts can make you more likely to have a fall. If you wear glasses, make sure to get your prescription updated if your vision changes. ?Medicine review. Work with your health care provider to regularly review all of the medicines you are taking, including over-the-counter medicines. Ask your health care provider about any side effects that may make you more likely to have a fall. Tell your health care provider if any medicines that you take make you feel dizzy or sleepy. ?Strength and balance checks. Your health care provider  may recommend certain tests to check your strength and balance while standing, walking, or changing positions. ?Foot health exam. Foot pain and numbness, as well as not wearing proper footwear, can make you more likely to have a fall. ?Screenings,  including: ?Osteoporosis screening. Osteoporosis is a condition that causes the bones to get weaker and break more easily. ?Blood pressure screening. Blood pressure changes and medicines to control blood pressure can make you feel dizzy. ?Depression screening. You may be more likely to have a fall if you have a fear of falling, feel depressed, or feel unable to do activities that you used to do. ?Alcohol use screening. Using too much alcohol can affect your balance and may make you more likely to have a fall. ?Follow these instructions at home: ?Lifestyle ?Do not drink alcohol if: ?Your health care provider tells you not to drink. ?If you drink alcohol: ?Limit how much you have to: ?0-1 drink a day for women. ?0-2 drinks a day for men. ?Know how much alcohol is in your drink. In the [place] one drink equals one 12 oz bottle of beer (355 mL), one 5 oz glass of wine (148 mL), or one 1? oz glass of hard liquor (44 mL). ?Do not use any products that contain nicotine or tobacco. These products include cigarettes, chewing tobacco, and vaping devices, such as e-cigarettes. If you need help quitting, ask your health care provider. ?Activity ? ?Follow a regular exercise program to stay fit. This will help you maintain your balance. Ask your health care provider what types of exercise are appropriate for you. ?If you need a cane or walker, use it as recommended by your health care provider. ?Wear supportive shoes that have nonskid soles. ?Safety ? ?Remove any tripping hazards, such as rugs, cords, and clutter. ?Install safety equipment such as grab bars in bathrooms and safety rails on stairs. ?Keep rooms and walkways well-lit. ?General instructions ?Talk with your health care provider about your risks for falling. Tell your health care provider if: ?You fall. Be sure to tell your health care provider about all falls, even ones that seem minor. ?You feel dizzy, tiredness (fatigue), or off-balance. ?Take over-the-counter and  prescription medicines only as told by your health care provider. These include supplements. ?Eat a healthy diet and maintain a healthy weight. A healthy diet includes low-fat dairy products, low-fat (lean) meats, and fiber from whole grains, beans, and lots of fruits and vegetables. ?Stay current with your vaccines. ?Schedule regular health, dental, and eye exams. ?Summary ?Having a healthy lifestyle and getting preventive care can help to protect your health and wellness after age 13. ?Screening and testing are the best way to find a health problem early and help you avoid having a fall. Early diagnosis and treatment give you the best chance for managing medical conditions that are more common for people who are older than age 72. ?Falls are a major cause of broken bones and head injuries in people who are older than age 34. Take precautions to prevent a fall at home. ?Work with your health care provider to learn what changes you can make to improve your health and wellness and to prevent falls. ?This information is not intended to replace advice given to you by your health care provider. Make sure you discuss any questions you have with your health care provider. ?Document Revised: 10/31/2020 Document Reviewed: 10/31/2020 ?Elsevier Patient Education ? [place]. ? ?

## 2021-11-02 NOTE — Chronic Care Management (AMB) (Signed)
?Chronic Care Management  ? ?CCM RN Visit Note ? ?11/02/2021 ?Name: Janet Le MRN: [id number] DOB: February 28, 1956 ? ?Subjective: ?Janet Le is a 66 y.o. year old female who is a primary care patient of Burns, Claudina Lick, MD. The care management team was consulted for assistance with disease management and care coordination needs.   ? ?Engaged with patient by telephone for follow up visit in response to provider referral for case management and/or care coordination services.  ? ?Consent to Services:  ?The patient was given information about Chronic Care Management services, agreed to services, and gave verbal consent prior to initiation of services.  Please see initial visit note for detailed documentation.  ?Patient agreed to services and verbal consent obtained.  ? ?Assessment: Review of patient past medical history, allergies, medications, health status, including review of consultants reports, laboratory and other test data, was performed as part of comprehensive evaluation and provision of chronic care management services.  ? ?SDOH (Social Determinants of Health) assessments and interventions performed:  ?SDOH Interventions   ? ?Flowsheet Row Most Recent Value  ?SDOH Interventions   ?Food Insecurity Interventions Intervention Not Indicated  [denies food insecurity,  continues to report on MOW wait list]  ?Housing Interventions Intervention Not Indicated  [continues to report lives alone in ground level apartment,  reports grandson "checks in" with her frequently]  ?Transportation Interventions Other (Comment)  [confirmed patient has been provided with resources for transportation by [employer],  reports she has submitted SCAT application and "is waiting to hear back, " reports family primarily assisting in interim]  ? ?  ?CCM Care Plan ? ?Allergies  ?Allergen Reactions  ? Sertraline Hcl Other (See Comments)  ?  Spasms; numbness  ? Ace Inhibitors Cough  ? Codeine Palpitations  ?  Darifenacin Hydrobromide Er Other (See Comments)  ?  Pt states made her feel queezy & drunk  ? Gabapentin Other (See Comments)  ?  Hallucinations  ? Pravastatin Other (See Comments)  ?  myalgias  ? Propoxyphene Hcl Palpitations  ? Seroquel [Quetiapine] Other (See Comments)  ?  Woke up screaming and yelling after taking it  ? Wellbutrin [Bupropion Hcl] Other (See Comments)  ?  Numbness of mouth/lips  ? ?Outpatient Encounter Medications as of 11/02/2021  ?Medication Sig  ? Accu-Chek FastClix Lancets MISC USE AS DIRECTED UP  TO  1 TIME  DAILY- DX CODE R73.03  ? acetaminophen (TYLENOL) 325 MG tablet Take 1,300 mg by mouth at bedtime.  ? amLODipine (NORVASC) 5 MG tablet TAKE ONE TABLET BY MOUTH ONCE DAILY  ? atorvastatin (LIPITOR) 10 MG tablet TAKE ONE TABLET BY MOUTH ONCE DAILY  ? Blood Glucose Monitoring Suppl (ACCU-CHEK GUIDE ME) w/Device KIT Use to check blood sugar daily  ? calcium carbonate (OS-CAL) 600 MG TABS tablet Take 600 mg by mouth 2 (two) times daily with a meal.  ? clotrimazole (LOTRIMIN) 1 % cream SMARTSIG:1 Topical Daily PRN  ? dicyclomine (BENTYL) 10 MG capsule Take 1-2 capsules (10-20 mg total) by mouth 2 (two) times daily.  ? DULoxetine (CYMBALTA) 60 MG capsule TAKE ONE CAPSULE BY MOUTH ONCE DAILY  ? Elastic Bandages & Supports (MEDICAL COMPRESSION SOCKS) MISC Compression sock, medium compression 20-30 mmHg. Use daily for venous insufficiency I87.2  ? fluticasone (FLONASE) 50 MCG/ACT nasal spray Place 2 sprays into both nostrils daily.  ? folic acid (FOLVITE) 1 MG tablet Take 1 tablet (1 mg total) by mouth daily.  ? furosemide (LASIX) 20 MG tablet  TAKE ONE TABLET BY MOUTH DAILY AT 9AM  ? glucose blood (ACCU-CHEK GUIDE) test strip Use to check sugars once daily. Dx Code-R73.03  ? Incontinence Supply Disposable (COMFORT PROTECT ADULT DIAPER/L) MISC Use as directed for urinary incontinence  ? linaclotide (LINZESS) 145 MCG CAPS capsule Take 1 capsule (145 mcg total) by mouth daily.  ? methotrexate 2.5 MG  tablet Take 10 mg by mouth once a week. Mondays  ? Misc Natural Products (HEALTHY LIVER PO) Take by mouth.  ? Multiple Vitamin (MULTIVITAMIN) tablet Take 1 tablet by mouth daily.  ? polyethylene glycol powder (GLYCOLAX/MIRALAX) 17 GM/SCOOP powder Take 17 g by mouth daily as needed for moderate constipation.  ? propranolol (INDERAL) 80 MG tablet TAKE ONE TABLET BY MOUTH TWICE DAILY @ 9AM-5PM  ? QUEtiapine (SEROQUEL) 50 MG tablet Take 1 tablet (50 mg total) by mouth at bedtime.  ? rivastigmine (EXELON) 4.5 MG capsule Take 1 capsule (4.5 mg total) by mouth 2 (two) times daily.  ? telmisartan (MICARDIS) 80 MG tablet TAKE ONE TABLET BY MOUTH DAILY AT 9AM (ORIG)  ? ?No facility-administered encounter medications on file as of 11/02/2021.  ? ?Patient Active Problem List  ? Diagnosis Date Noted  ? Urinary incontinence 07/03/2021  ? Breast asymmetry 04/08/2021  ? Venous insufficiency of both lower extremities 04/06/2021  ? Mild neurocognitive disorder with Lewy bodies 01/25/2021  ? Cervical radiculopathy 09/07/2020  ? Visual hallucinations   ? Attention deficit hyperactivity disorder 06/06/2020  ? Glaucoma 06/06/2020  ? Blepharitis 11/07/2019  ? Type 2 diabetes mellitus without complication, without long-term current use of insulin 10/06/2019  ? Hypertensive retinopathy of both eyes 10/06/2019  ? Iridocyclitis 10/06/2019  ? Lattice degeneration of both retinas 10/06/2019  ? Pseudophakia of both eyes 10/06/2019  ? Nocturnal hypoxemia 05/11/2019  ? Dyspnea on exertion 05/11/2019  ? Excessive daytime sleepiness 05/11/2019  ? Primary osteoarthritis involving multiple joints 03/10/2019  ? Post traumatic stress disorder (PTSD)   ? Laryngopharyngeal reflux (LPR) 07/03/2018  ? Bilateral leg edema 06/12/2018  ? Degeneration of lumbar intervertebral disc 01/09/2018  ? Spondylolisthesis of lumbar region 01/09/2018  ? Fatigue 11/14/2017  ? Arthralgia of hip 11/14/2017  ? Major depressive disorder 10/08/2017  ? Generalized anxiety  disorder 10/08/2017  ? Neuropathy 10/07/2017  ? LVH (left ventricular hypertrophy), mild 05/18/2017  ? S/P knee surgery 10/30/2016  ? Chronic low back pain 07/18/2016  ? Cervicalgia 05/30/2016  ? Neck pain 05/30/2016  ? Osteopenia 11/18/2015  ? Thrombocytopenia 05/30/2015  ? Pain in left knee 09/08/2014  ? Gout 12/11/2013  ? Morbid obesity 11/06/2013  ? Left lumbar radiculopathy 04/13/2012  ? Renal cell carcinoma ([place])   ? IBS (irritable bowel syndrome) with chronic constipation 11/14/2010  ? GERD (gastroesophageal reflux disease) 11/14/2010  ? Hyperlipidemia 03/16/2010  ? Allergic rhinitis 08/31/2009  ? Syncope and collapse 05/16/2009  ? Essential hypertension 05/06/2009  ? ?Conditions to be addressed/monitored:  HTN and DMII ? ?Care Plan : RN Care Manager Plan of Care  ?Updates made by Knox Royalty, RN since 11/02/2021 12:00 AM  ?  ? ?Problem: Chronic Disease Management Needs   ?Priority: Medium  ?  ? ?Long-Range Goal: Ongoing adherence to established plan of care for long term chronic disease management   ?Start Date: 02/09/2021  ?Expected End Date: 02/09/2022  ?Priority: Medium  ?Note:   ?Current Barriers:  ?Chronic Disease Management support and education needs related to HTN, DMII, and cognitive decline/ impairment ?Patient lives alone- reports limited ability to care for  self in setting of progressive cognitive decline with recent Lewy Body diagnosis:  ?07/13/21: patient reports she continues to live alone; however, she is currently staying at her daughter's Lollie Marrow") home in [place], [place]-- Lollie Marrow participates in today's call while phone on speaker mode; patient provides verbal consent today for "anyone at Dr. Eilleen Kempf to speak with daughter Lollie Marrow.  Reports this daughter has been assisting her recently with follow up appointments and general daily health management.  Lollie Marrow requests that she be contacted for follow up, as patient's phone "does not always work right;" and due to patient forgetting health  needs easily.  She also requests that AVS be mailed to her home in [place], as patient is currently residing with her, albeit this may be temporary ?Daughter Lollie Marrow phone number: [phone]; address: [phone] [place]

## 2021-11-03 ENCOUNTER — Telehealth: Payer: Self-pay | Admitting: Podiatry

## 2021-11-03 NOTE — Telephone Encounter (Signed)
Pt called in to request a RX for compression socks and a wheel chair. Please advise. ?

## 2021-11-06 ENCOUNTER — Telehealth: Payer: Self-pay | Admitting: Internal Medicine

## 2021-11-06 NOTE — Telephone Encounter (Signed)
Pt called in and requesting a call back from assistant regarding her walker.  ? ?States that it does not have any brakes and wants to know who she should speak with in regards to this matter. She does not have the number to supplier.  ?

## 2021-11-08 NOTE — Telephone Encounter (Signed)
Patient may come into the office for another prescription for compression socks.  I cannot provide a prescription for a wheelchair however.  This would need to come from her PCP.  Thanks, Dr. Amalia Hailey

## 2021-11-08 NOTE — Telephone Encounter (Signed)
Insurance rep is calling to follow up on the request for the walker.  ? ?She stated that [employer] is in there network of vendors. ? ?Information that they will need ?Office notes stating why its medically necessary ?Rx ?Demographic info  ?Proof of Insurance ? ?Fax [phone] ?

## 2021-11-09 NOTE — Telephone Encounter (Signed)
Note addended

## 2021-11-09 NOTE — Telephone Encounter (Signed)
Message sent to [employer] today via community message (in house)

## 2021-11-21 ENCOUNTER — Telehealth: Payer: Self-pay | Admitting: Internal Medicine

## 2021-11-21 NOTE — Telephone Encounter (Signed)
Also, needs fill on QUEtiapine (SEROQUEL) 50 MG tablet. Requestion 60 or 90 days.

## 2021-11-21 NOTE — Telephone Encounter (Signed)
1.Medication Requested: polyethylene glycol powder (GLYCOLAX/MIRALAX) 17 GM/SCOOP powder linaclotide (LINZESS) 145 MCG CAPS capsule  2. Pharmacy (Name, Street, [place]): [place] ([place]), [place] - [address] Phone:  [phone]  Fax:  [phone]     3. On Med List:  4. Last Visit with PCP:  5. Next visit date with PCP:   Agent: Please be advised that RX refills may take up to 3 business days. We ask that you follow-up with your pharmacy.   Has a question about rivastigmine (EXELON) 4.5 MG capsule. Had reaction at first , increased dosage of medication. Should she continue? Not bothering her like it was before.

## 2021-11-22 DIAGNOSIS — E1159 Type 2 diabetes mellitus with other circulatory complications: Secondary | ICD-10-CM | POA: Diagnosis not present

## 2021-11-22 DIAGNOSIS — I10 Essential (primary) hypertension: Secondary | ICD-10-CM | POA: Diagnosis not present

## 2021-11-22 DIAGNOSIS — F419 Anxiety disorder, unspecified: Secondary | ICD-10-CM

## 2021-11-22 NOTE — Telephone Encounter (Signed)
RE: DME Received: 1 week ago Lerry Paterson, CMA; [place] Dooms; [place], [place]; 1 other Got it! Thanks!        Previous Messages   ----- Message -----  From: Marcina Millard, CMA  Sent: 11/09/2021   9:25 AM EDT  To: Darlina Guys, Chapman Fitch, *  Subject: DME                                             Good morning,   Dr. Quay Burow has placed an order for DME. Please let me know if you need anything else.   Thanks,  [employer]

## 2021-11-22 NOTE — Telephone Encounter (Signed)
[employer] claims they never received the info for the PA to get the walker. Please fax info to:   Fax: [phone]

## 2021-11-22 NOTE — Telephone Encounter (Signed)
Order confirmed by Stephannie Peters that she received early.

## 2021-11-23 ENCOUNTER — Other Ambulatory Visit: Payer: Self-pay

## 2021-11-23 MED ORDER — LINACLOTIDE 145 MCG PO CAPS
145.0000 ug | ORAL_CAPSULE | Freq: Every day | ORAL | 3 refills | Status: DC
Start: 2021-11-23 — End: 2022-02-23

## 2021-11-23 MED ORDER — POLYETHYLENE GLYCOL 3350 17 GM/SCOOP PO POWD: 17.0000 g | Freq: Every day | ORAL | 11 refills | Status: DC | PRN

## 2021-11-23 NOTE — Telephone Encounter (Signed)
Spoke with patient today.  Refills sent in except for Quetiapine since this was prescribed by another doctor.

## 2021-11-24 ENCOUNTER — Telehealth: Payer: Self-pay | Admitting: Neurology

## 2021-11-24 MED ORDER — QUETIAPINE FUMARATE 50 MG PO TABS: 50.0000 mg | ORAL_TABLET | Freq: Two times a day (BID) | ORAL | 4 refills | Status: DC

## 2021-11-24 NOTE — Telephone Encounter (Signed)
Telephone call to patient, Patient advised, Per Dr.Jaffe, I am willing to prescribe '50mg'$  twice daily.  I will not prescribe anything higher.  These are potentially dangerous medications.  She must know not to increase dosing without discussing it with [place] first.  Otherwise, I will not be able to continue prescribing this for her.  Patient verbalized understanding.

## 2021-11-24 NOTE — Telephone Encounter (Signed)
Spoke to patient, Per last note patient was to increase to 50 mg in May. 4 tabs of the 50 mg wasn't advised to her at all.  Patient states she know she increase on her own.  Advised patient I can not send a 90 day supply or anything until I speak to [facility] and he advised what and how may to send in.  Patient transferred her scrip to [facility].

## 2021-11-24 NOTE — Telephone Encounter (Signed)
Patient is out of the medicine jaffe put her on. She is taking 4 a day and needs a refill. Seroquel '50mg'$  4 a day. She would like a call back Pharmacy-[facility] in [place]

## 2021-11-24 NOTE — Telephone Encounter (Signed)
Patient is wanting to talk to someone about medication she is completely out of the medication    quetiapine

## 2021-12-04 ENCOUNTER — Telehealth: Payer: Self-pay | Admitting: Neurology

## 2021-12-04 NOTE — Telephone Encounter (Signed)
Patient called to report she is having more falls and she'd like to see Dr. Tomi Likens sooner than November 2023 as scheduled.   She also reports twitching in her feet, legs, and some in her lips. Patient reported a fall straight onto her knee yesterday. Patient said she did not hurt herself but she is concerned.  Okay to move up to sooner?

## 2021-12-05 ENCOUNTER — Telehealth: Payer: Self-pay | Admitting: Podiatry

## 2021-12-05 NOTE — Telephone Encounter (Signed)
Called and spoke with patient. She will reach out to her daughter and call back for scheduling.

## 2021-12-05 NOTE — Telephone Encounter (Signed)
Patient called and stated she would like another prescription for compression socks. She stated that last one she had it only stated she would get one pair. She would like a few refills please.  Please advise

## 2021-12-05 NOTE — Telephone Encounter (Signed)
Patient's VM full so sent update by text re: [facility] has a sooner opening for an appointment to offer you with Dr. Tomi Likens. Please call as soon as possible for details. [phone].

## 2021-12-06 ENCOUNTER — Ambulatory Visit: Payer: 59 | Admitting: Podiatry

## 2021-12-06 ENCOUNTER — Telehealth: Payer: Self-pay | Admitting: Neurology

## 2021-12-06 ENCOUNTER — Other Ambulatory Visit: Payer: Self-pay | Admitting: Podiatry

## 2021-12-06 DIAGNOSIS — E119 Type 2 diabetes mellitus without complications: Secondary | ICD-10-CM

## 2021-12-06 DIAGNOSIS — R600 Localized edema: Secondary | ICD-10-CM

## 2021-12-06 NOTE — Telephone Encounter (Signed)
Order placed in patients chart. Patient may come into the front office and we can print off the order and give to patient. - Dr. Amalia Hailey

## 2021-12-06 NOTE — Telephone Encounter (Signed)
Pt called in stating she received a prescription for compression stockings, but she noticed it stated 1 pair. She says her insurance will pay for multiple pairs and would like a prescription that does not specify a quantity of stockings.

## 2021-12-07 NOTE — Telephone Encounter (Signed)
Called patient to let her know she needs to contact Dr. Quay Burow for that prescription as he is the one that recommended it. Called patient but voicemail is full

## 2021-12-12 ENCOUNTER — Telehealth: Payer: Self-pay | Admitting: Podiatry

## 2021-12-12 NOTE — Telephone Encounter (Signed)
Pt called back requesting orders be faxed over to [phone]. Orders for DME faxed for pt.

## 2021-12-12 NOTE — Telephone Encounter (Signed)
Patient would like you to fax order for Compression Stockings over to [facility] on [facility]  - she is headed there now to get fitted, she did not have their number or fax

## 2021-12-12 NOTE — Telephone Encounter (Signed)
Thanks, Dr. Chanteria Haggard

## 2021-12-13 ENCOUNTER — Telehealth: Payer: Medicare (Managed Care)

## 2021-12-21 ENCOUNTER — Telehealth: Payer: Self-pay | Admitting: *Deleted

## 2021-12-21 NOTE — Chronic Care Management (AMB) (Signed)
  Care Coordination Note  12/21/2021 Name: [name] MRN: [id number] DOB: 1956/06/14  Janet Le is a 66 y.o. year old female who is a primary care patient of Quay Burow, Claudina Lick, MD and is actively engaged with the care management team. I reached out to [name] by phone today to assist with re-scheduling a follow up visit with the RN Case Manager  Follow up plan: Unsuccessful telephone outreach attempt made. The care management team will reach out to the patient again over the next 1-2 days. If patient returns call to provider office, please advise to call [place] at [phone].  [place] Management  Direct Dial: [phone]

## 2021-12-22 NOTE — Chronic Care Management (AMB) (Unsigned)
  Chronic Care Management Note  12/22/2021 Name: Janet Le MRN: [id number] DOB: Oct 09, 1955  Janet Le is a 66 y.o. year old female who is a primary care patient of Quay Burow, Claudina Lick, MD and is actively engaged with the care management team. I reached out to [name] by phone today to assist with re-scheduling a follow up visit with the RN Case Manager  Follow up plan: Unsuccessful telephone outreach attempt made. A HIPAA compliant phone message was left for the patient providing contact information and requesting a return call.  The care management team will reach out to the patient again over the next 5 days.  If patient returns call to provider office, please advise to call [place] at [phone].  [place] Management  Direct Dial: [phone]

## 2021-12-25 NOTE — Chronic Care Management (AMB) (Signed)
  Chronic Care Management Note  12/25/2021 Name: [name] MRN: [id number] DOB: February 21, 1956  Janet Le is a 66 y.o. year old female who is a primary care patient of Burns, Claudina Lick, MD and is actively engaged with the care management team. I reached out to [name] by phone today to assist with re-scheduling a follow up visit with the RN Case Manager  Follow up plan: Telephone appointment with care management team member scheduled for:12/29/21  [place] Management  Direct Dial: [phone]

## 2021-12-28 ENCOUNTER — Telehealth: Payer: Medicare (Managed Care)

## 2021-12-29 ENCOUNTER — Encounter: Payer: Self-pay | Admitting: *Deleted

## 2021-12-29 ENCOUNTER — Telehealth: Payer: Self-pay | Admitting: *Deleted

## 2021-12-29 ENCOUNTER — Telehealth: Payer: Medicare (Managed Care)

## 2021-12-29 NOTE — Telephone Encounter (Addendum)
  Chronic Care Management   Follow Up Note   12/29/2021 Name: ANUM PALECEK MRN: [id number] DOB: 11/12/1955  Referred by: Binnie Rail, MD Reason for referral : Chronic Care Management (CCM RN CM Follow Up Outreach; cognitive impairment; HTN; unsuccessful outreach attempt)  An unsuccessful telephone outreach was attempted today. The patient was referred to the case management team for assistance with care management and care coordination.   Follow Up Plan:  Received automated outgoing voice message stating patient's voice mail box is full and unable to accept messages; unable to leave voice message requesting call back Will place request with scheduling care guide to contact patient to re-schedule today's missed CCM RN follow up telephone appointment if I do not hear back from patient by end of day  [name], RN, BSN, [place] [phone]: direct office

## 2021-12-29 NOTE — Telephone Encounter (Signed)
Pt called and is requesting a callback to reschedule her missed CCM RN CM follow up telephone visit that was scheduled for 12/29/21 at 10 AM.   [phone]

## 2022-01-17 ENCOUNTER — Ambulatory Visit: Payer: Self-pay | Admitting: Licensed Clinical Social Worker

## 2022-01-17 NOTE — Chronic Care Management (AMB) (Signed)
Care Management Clinical Social Work  Collaboration Note  01/17/2022 Name: Janet Le MRN: [id number] DOB: 03-15-1956  Janet Le is a 66 y.o. year old female who is a primary care patient of Burns, Claudina Lick, MD.  The Care Management team was consulted for assistance with chronic disease management and coordination needs.  Summary: Patient was not interviewed or contacted during this encounter CCM LCSW collaborated with RN who has not been able to reach patient. LCSW has provided patient with all needed information and resources.  She is currently followed by and connected with neuropsych  No Follow up :  Patient does not require continued follow-up by CCM LCSW.  CCM LCSW will disconnect from patient's care team at this time, but will be available at any time they would like to re-engage for care coordination services.    Assessment: Review of patient past medical history, allergies, medications, and health status, including review of relevant consultants reports was performed today as part of a comprehensive evaluation and provision of chronic care management and care coordination services.  SDOH (Social Determinants of Health) assessments and interventions performed:    Advanced Directives Status: See Vynca application for related entries.  Care Plan  Conditions to be addressed/monitored: ;   Care Plan : LCSW Plan of Care  Updates made by Maurine Cane, LCSW since 01/17/2022 12:00 AM  Completed 01/17/2022   Problem: Long-Term Care Planning and support Resolved 01/17/2022     Goal: Effective Long-Term Care Planning for mental health support Completed 01/17/2022  Start Date: 07/18/2021  Recent Progress: On track  Priority: High  Note:   Current Barriers:  Level of Care Concerns:Inability to perform ADL's independently and Memory Deficits SDOH Furniture conservator/restorer and housing information)  CSW Clinical Goal(s):  Patient  and Caregiver  will work with agencies discussed and  LCSW to address needs related to level of care  through collaboration with [place] representative, provider, and care team.   Interventions: 1:1 collaboration with primary care provider regarding development and update of comprehensive plan of care as evidenced by provider attestation and co-signature Inter-disciplinary care team collaboration (see longitudinal plan of care) Evaluation of current treatment plan related to  self management and patient's adherence to plan as established by provider Review resources, discussed options and provided patient information about  Department of Social Services ( food stamps and Medicaid ) Transportation provided by [employer] connected with insurance provider  Referral to care guide (meals on wheels and housing resource)  Publishing rights manager (PCS) :Does not have Medicaid Private pay options for personal care needs :unable to afford Meals on wheels (Care guide to assist with) Facility placement process :Not interested  Level of Care Concerns in a patient with Anxiety and mild cognitive disorder  :  (Status: No Needs Identified this visit) Current level of care: home, alone and temp staying with daughter in [place] Evaluation of patient safety in current living environment Solution-Focused Strategies employed:   Mental Health:  (Status: Goal on Track (progressing): YES.) Evaluation of current treatment plan related to Anxiety with Excessive Worry, and symptoms of depression Active listening / Reflection utilized  Reviewed mental health medications and discussed importance of compliance: currently taking cymbalta thinks it needs to be increased   Previous referral made to [facility] for therapy and [facility] for medication  management/ appointment 09/2021 canceled Patient has Nov. Appointments with Neuropsych  Patient Self-Care Activities: Call Department of Social Service to inquire about the in-home aide  program [phone] ( please remember they have a wait list)      Casimer Lanius, [place]  [phone]

## 2022-01-17 NOTE — Patient Instructions (Signed)
     Patient was not contacted during this encounter.  LCSW collaborated with care team   Casimer Lanius, [place]  [phone]

## 2022-01-18 ENCOUNTER — Telehealth: Payer: Self-pay | Admitting: *Deleted

## 2022-01-18 NOTE — Chronic Care Management (AMB) (Signed)
  Care Coordination Note  01/18/2022 Name: Janet Le MRN: [id number] DOB: March 09, 1956  Janet Le is a 65 y.o. year old female who is a primary care patient of Quay Burow, Claudina Lick, MD and is actively engaged with the care management team. I reached out to [name] by phone today to assist with re-scheduling a follow up visit with the RN Case Manager  Follow up plan: Unsuccessful telephone outreach attempt made. A HIPAA compliant phone message was left for the patient providing contact information and requesting a return call.  The care management team will reach out to the patient again over the next 7 days.  If patient returns call to provider office, please advise to call [place] at [phone].  [place]  Direct Dial: [phone]

## 2022-01-23 ENCOUNTER — Ambulatory Visit: Payer: Medicare (Managed Care) | Admitting: *Deleted

## 2022-01-23 DIAGNOSIS — F02A0 Dementia in other diseases classified elsewhere, mild, without behavioral disturbance, psychotic disturbance, mood disturbance, and anxiety: Secondary | ICD-10-CM

## 2022-01-23 DIAGNOSIS — I10 Essential (primary) hypertension: Secondary | ICD-10-CM

## 2022-01-23 DIAGNOSIS — E119 Type 2 diabetes mellitus without complications: Secondary | ICD-10-CM

## 2022-01-23 NOTE — Chronic Care Management (AMB) (Signed)
Care Management    RN Visit Note  01/23/2022 Name: Janet Le MRN: [id number] DOB: 11-20-1955  Subjective: Janet Le is a 66 y.o. year old female who is a primary care patient of Burns, Janet Lick, MD. The care management team was consulted for assistance with disease management and care coordination needs.    Engaged with patient by telephone for follow up visit/ RN CM case closure in response to provider referral for case management and/or care coordination services.   Consent to Services:   [name] was given information about Care Management services 02/09/21 including:  Care Management services includes personalized support from designated clinical staff supervised by her physician, including individualized plan of care and coordination with other care providers 24/7 contact phone numbers for assistance for urgent and routine care needs. The patient may stop case management services at any time by phone call to the office staff.  Patient agreed to services and consent obtained.   Assessment: Review of patient past medical history, allergies, medications, health status, including review of consultants reports, laboratory and other test data, was performed as part of comprehensive evaluation and provision of chronic care management services.   SDOH (Social Determinants of Health) assessments and interventions performed:  SDOH Interventions    Flowsheet Row Most Recent Value  SDOH Interventions   Food Insecurity Interventions Intervention Not Indicated  [denies food insecurity today]  Transportation Interventions Intervention Not Indicated  [patient confirms she has completed [employer] application,  family assisting as indicated while application in process]     Care Plan  Allergies  Allergen Reactions   Sertraline Hcl Other (See Comments)    Spasms; numbness   Ace Inhibitors Cough   Codeine Palpitations   Darifenacin Hydrobromide Er Other (See Comments)    Pt states  made her feel queezy & drunk   Gabapentin Other (See Comments)    Hallucinations   Pravastatin Other (See Comments)    myalgias   Propoxyphene Hcl Palpitations   Seroquel [Quetiapine] Other (See Comments)    Woke up screaming and yelling after taking it   Wellbutrin [Bupropion Hcl] Other (See Comments)    Numbness of mouth/lips   Outpatient Encounter Medications as of 01/23/2022  Medication Sig   Accu-Chek FastClix Lancets MISC USE AS DIRECTED UP  TO  1 TIME  DAILY- DX CODE R73.03   acetaminophen (TYLENOL) 325 MG tablet Take 1,300 mg by mouth at bedtime.   amLODipine (NORVASC) 5 MG tablet TAKE ONE TABLET BY MOUTH ONCE DAILY   atorvastatin (LIPITOR) 10 MG tablet TAKE ONE TABLET BY MOUTH ONCE DAILY   Blood Glucose Monitoring Suppl (ACCU-CHEK GUIDE ME) w/Device KIT Use to check blood sugar daily   calcium carbonate (OS-CAL) 600 MG TABS tablet Take 600 mg by mouth 2 (two) times daily with a meal.   clotrimazole (LOTRIMIN) 1 % cream SMARTSIG:1 Topical Daily PRN   dicyclomine (BENTYL) 10 MG capsule Take 1-2 capsules (10-20 mg total) by mouth 2 (two) times daily.   DULoxetine (CYMBALTA) 60 MG capsule TAKE ONE CAPSULE BY MOUTH ONCE DAILY   Elastic Bandages & Supports (MEDICAL COMPRESSION SOCKS) MISC Compression sock, medium compression 20-30 mmHg. Use daily for venous insufficiency I87.2   fluticasone (FLONASE) 50 MCG/ACT nasal spray Place 2 sprays into both nostrils daily.   folic acid (FOLVITE) 1 MG tablet Take 1 tablet (1 mg total) by mouth daily.   furosemide (LASIX) 20 MG tablet TAKE ONE TABLET BY MOUTH DAILY AT 9AM   glucose  blood (ACCU-CHEK GUIDE) test strip Use to check sugars once daily. Dx Code-R73.03   Incontinence Supply Disposable (COMFORT PROTECT ADULT DIAPER/L) MISC Use as directed for urinary incontinence   linaclotide (LINZESS) 145 MCG CAPS capsule Take 1 capsule (145 mcg total) by mouth daily.   methotrexate 2.5 MG tablet Take 10 mg by mouth once a week. Mondays   Misc Natural  Products (HEALTHY LIVER PO) Take by mouth.   Multiple Vitamin (MULTIVITAMIN) tablet Take 1 tablet by mouth daily.   polyethylene glycol powder (GLYCOLAX/MIRALAX) 17 GM/SCOOP powder Take 17 g by mouth daily as needed for moderate constipation.   propranolol (INDERAL) 80 MG tablet TAKE ONE TABLET BY MOUTH TWICE DAILY @ 9AM-5PM   QUEtiapine (SEROQUEL) 50 MG tablet Take 1 tablet (50 mg total) by mouth 2 (two) times daily.   rivastigmine (EXELON) 4.5 MG capsule Take 1 capsule (4.5 mg total) by mouth 2 (two) times daily.   telmisartan (MICARDIS) 80 MG tablet TAKE ONE TABLET BY MOUTH DAILY AT 9AM (ORIG)   No facility-administered encounter medications on file as of 01/23/2022.   Patient Active Problem List   Diagnosis Date Noted   Urinary incontinence 07/03/2021   Breast asymmetry 04/08/2021   Venous insufficiency of both lower extremities 04/06/2021   Mild neurocognitive disorder with Lewy bodies 01/25/2021   Cervical radiculopathy 09/07/2020   Visual hallucinations    Attention deficit hyperactivity disorder 06/06/2020   Glaucoma 06/06/2020   Blepharitis 11/07/2019   Type 2 diabetes mellitus without complication, without long-term current use of insulin 10/06/2019   Hypertensive retinopathy of both eyes 10/06/2019   Iridocyclitis 10/06/2019   Lattice degeneration of both retinas 10/06/2019   Pseudophakia of both eyes 10/06/2019   Nocturnal hypoxemia 05/11/2019   Dyspnea on exertion 05/11/2019   Excessive daytime sleepiness 05/11/2019   Primary osteoarthritis involving multiple joints 03/10/2019   Post traumatic stress disorder (PTSD)    Laryngopharyngeal reflux (LPR) 07/03/2018   Bilateral leg edema 06/12/2018   Degeneration of lumbar intervertebral disc 01/09/2018   Spondylolisthesis of lumbar region 01/09/2018   Fatigue 11/14/2017   Arthralgia of hip 11/14/2017   Major depressive disorder 10/08/2017   Generalized anxiety disorder 10/08/2017   Neuropathy 10/07/2017   LVH (left  ventricular hypertrophy), mild 05/18/2017   S/P knee surgery 10/30/2016   Chronic low back pain 07/18/2016   Cervicalgia 05/30/2016   Neck pain 05/30/2016   Osteopenia 11/18/2015   Thrombocytopenia 05/30/2015   Pain in left knee 09/08/2014   Gout 12/11/2013   Morbid obesity 11/06/2013   Left lumbar radiculopathy 04/13/2012   Renal cell carcinoma (HCC)    IBS (irritable bowel syndrome) with chronic constipation 11/14/2010   GERD (gastroesophageal reflux disease) 11/14/2010   Hyperlipidemia 03/16/2010   Allergic rhinitis 08/31/2009   Syncope and collapse 05/16/2009   Essential hypertension 05/06/2009   Conditions to be addressed/monitored: HTN and DMII  Care Plan : RN Care Manager Plan of Care  Updates made by Knox Royalty, RN since 01/23/2022 12:00 AM     Problem: Chronic Disease Management Needs   Priority: Medium     Long-Range Goal: Ongoing adherence to established plan of care for long term chronic disease management   Start Date: 02/09/2021  Expected End Date: 02/09/2022  Priority: Medium  Note:   Current Barriers:  Chronic Disease Management support and education needs related to HTN, DMII, and cognitive decline/ impairment Patient lives alone- reports limited ability to care for self in setting of progressive cognitive decline with recent Lewy  Body diagnosis:  07/13/21: patient reports she continues to live alone; however, she is currently staying at her daughter's Lollie Marrow") home in [place], [place]-- Lollie Marrow participates in today's call while phone on speaker mode; patient provides verbal consent today for "anyone at Dr. Eilleen Kempf to speak with daughter Lollie Marrow.  Reports this daughter has been assisting her recently with follow up appointments and general daily health management.  Lollie Marrow requests that she be contacted for follow up, as patient's phone "does not always work right;" and due to patient forgetting health needs easily.  She also requests that AVS be mailed to her  home in [place], as patient is currently residing with her, albeit this may be temporary Daughter Lollie Marrow phone number: [phone]; address: [address], [place], [place] [address] 09/13/21: patient reports she is back at her own her home and continues to live alone; reports local family assisting with care needs as allowed/ as needed 11/02/21: again reports "mainly" living alone in ground level apartment; reports family "visits" regularly 01/23/22: today patient reports her grandson is staying at her home and assisting in care needs as indicated Multiple community resource/ social work needs:  CCM CSW active at time of CCM referral  07/13/21: CCM CSW previously involved; signed off 06/20/21 due to inability to maintain contact with patient: today, patient/ daughter Lollie Marrow report ongoing desire for information about obtaining personal care services (or other in-home care; patient unsure if she actually has active medicaid) and/ or beginning the process to transition to ALF vs. SNF-- will place another CCM CSW referral; encouraged patient and daughter to fully engage with CCM CSW team once telephone call appointment is scheduled; they verbalize understanding/ agreement 09/13/21: reminded patient of upcoming scheduled phone call with CCM Clinical Social Worker on Monday 09/18/21 at 10:00 am- I had patient physically write this phone call appointment on her calendar today 11/02/21: noted CCM CSW remains on care team for needs, but no scheduled follow up- will become re-involved as indicated per 10/24/21 note 01/23/22: reports she remains on meals on wheels and [employer] wait lists; confirms she continues to be followed by neuropsychiatric provider Medication management in setting of cognitive decline: [facility] team active  07/13/21: patient reports good adherence to medication regimen with use of blister packaging 09/13/21: reminded patient of upcoming scheduled phone call with CCM Pharmacy team on Friday, 09/29/21  at 2:00 pm- I had patient physically write this phone call appointment on her calendar today Patient reports continues using blister packaging for medications Reports her dose of Rivastigmine was increased by neurology provider- verified dose was increased to 4.5 mg po BID (was previously 3 mg) increased dose not yet included in blister packaging; encouraged patient to contact pharmacy to inform/ update so new dosing may be added to blister packaging; reports she will do; however, given her ongoing issues with memory management-- I will make CCM Pharmacy team aware as well 01/23/22: patient denies medication concerns today and tells me she is taking all medications as prescribed Lack of focus/ ability to convey thought patterns in clear manner-- recent diagnosis of cognitive decline/ Lewey bodies dementia 11/02/21: Patient has ongoing cognitive decline/ memory issues: she remains very confused about appointments, her health care in general; today, is essentially unable to tell me anything meaningful about her progress since our last telephone visit 09/13/21 01/23/22: patient tells me today her family is assisting with management of provider appointments  RNCM Clinical Goal(s):  Patient will continue to work with RN Care Manager to address care management  and care coordination needs related to HTN, DMII, and cognitive decline  through collaboration with RN Care manager, provider, and care team.   Interventions: 1:1 collaboration with primary care provider regarding development and update of comprehensive plan of care as evidenced by provider attestation and co-signature Inter-disciplinary care team collaboration (see longitudinal plan of care) Evaluation of current treatment plan related to  self management and patient's adherence to plan as established by provider Review of patient status, including review of consultants reports, relevant laboratory and other test results, and medications completed SDOH  updated: no new/ unmet concerns identified Again reports she has submitted her application to [employer] for transportation services: she is waiting to hear back around application status; reports family assisting with transportation in the interim; however, I note several missed/ cancelled provider appointments-- she tells me she has family that is assisting with appointment management/ transportation in the interim; she was encouraged to follow up with [employer] around status of application Pain assessment updated: reports vague chronic shoulder pain today, "since I fell;" however, she does not tell me when she fell, just states, "I didn't really hurt myself, I am just sore" Falls assessment updated: reports at least one new/ recent falls x 12 months- "awhile ago;" continues using cane as needed/ indicated, "pretty much all the time;" patient is unable to tell me when this fall occurred; encouragement provided to continue efforts at fall prevention; previously provided education around fall risks/ prevention reinforced Medications discussed: reports continues to independently self-manage medications; vaguely endorses continues using blister packaging Reviewed upcoming scheduled provider appointments: 04/04/22- dexa bone density and mamogram; 05/03/22 and 05/10/22- neuropsychiatric provider; 05/23/22- neurology provider; patient confirms is aware of all and has plans to attend as scheduled- Encouraged patient to keep calendar of scheduled provider appointments and to schedule next PCP office visit Discussed plans with patient for ongoing care management follow up- patient denies current care coordination/ care management needs and is agreeable to CCM RN CM case closure today; verbalizes understanding to contact PCP or other care providers for any needs that arise in the future, and confirms she has contact information for all care providers     Diabetes:  (Status: 01/23/22: Goal Met.)  Long-Term goal Lab Results   Component Value Date   HGBA1C 5.7 07/03/2021  Provided education to patient about basic DM disease process; Reviewed prescribed diet with patient carb modified, low sugar, heart healthy, low salt; Assessed social determinant of health barriers;        Confirmed patient does not monitor blood sugars at home: reports "mainly just try to eat right" to control DMII Reinforced previously provided education around signs/ symptoms low blood sugar along with corresponding action plan: patient denies recent signs/ symptoms of low blood sugars Reinforced previously provided education around proper diet for DMII/ HTN: heart healthy, low salt, low carb/ low sugar diet; she tells me her grandson has been coming to her apartment and assisting with meals  Hypertension: (Status: 01/23/22: Goal Met.) Long-Term goal Last practice recorded BP readings:  BP Readings from Last 3 Encounters:  10/11/21 (!) 142/94  09/07/21 107/70  07/03/21 134/78  Most recent eGFR/CrCl:  Lab Results  Component Value Date   EGFR 82 (L) 02/28/2017    No components found for: CRCL  Discussed complications of poorly controlled blood pressure such as heart disease, stroke, circulatory complications, vision complications, kidney impairment, sexual dysfunction;  Re- confirmed patient does not monitor blood pressures at home- reports takes medications as prescribed Confirmed patient has  not been active- reports stays inside "most of the time" Encouraged patient to keep calendar of scheduled provider appointments and to schedule next PCP office visit    Plan:  No further follow up required: patient denies current care coordination/ care management needs and confirms that she has been provided with resources for previously stated needs; she is agreeable to RN CM case closure today; case closure accordingly     [name], RN, BSN, [place] [phone]: direct  office

## 2022-02-02 ENCOUNTER — Other Ambulatory Visit: Payer: Self-pay | Admitting: Neurology

## 2022-02-05 ENCOUNTER — Other Ambulatory Visit: Payer: Self-pay | Admitting: Physician Assistant

## 2022-02-23 ENCOUNTER — Other Ambulatory Visit: Payer: Self-pay | Admitting: Physician Assistant

## 2022-02-23 ENCOUNTER — Other Ambulatory Visit: Payer: Self-pay | Admitting: Internal Medicine

## 2022-02-23 DIAGNOSIS — M542 Cervicalgia: Secondary | ICD-10-CM

## 2022-03-05 ENCOUNTER — Other Ambulatory Visit: Payer: Self-pay | Admitting: Internal Medicine

## 2022-03-06 ENCOUNTER — Telehealth: Payer: Self-pay

## 2022-03-06 NOTE — Telephone Encounter (Signed)
Pt is requesting a Rx on: Elastic Bandages & Supports (MEDICAL COMPRESSION SOCKS) MISC  For Legs and arms  To be faxed to  [phone] Kris Mouton

## 2022-03-08 ENCOUNTER — Other Ambulatory Visit: Payer: Self-pay

## 2022-03-08 DIAGNOSIS — R600 Localized edema: Secondary | ICD-10-CM

## 2022-03-08 DIAGNOSIS — E119 Type 2 diabetes mellitus without complications: Secondary | ICD-10-CM

## 2022-03-08 DIAGNOSIS — I872 Venous insufficiency (chronic) (peripheral): Secondary | ICD-10-CM

## 2022-03-08 MED ORDER — MEDICAL COMPRESSION SOCKS MISC: 0 refills | Status: DC

## 2022-03-08 NOTE — Telephone Encounter (Signed)
Faxed today

## 2022-03-09 ENCOUNTER — Ambulatory Visit: Payer: 59 | Admitting: Internal Medicine

## 2022-03-26 ENCOUNTER — Other Ambulatory Visit: Payer: Self-pay | Admitting: Internal Medicine

## 2022-03-30 ENCOUNTER — Ambulatory Visit (INDEPENDENT_AMBULATORY_CARE_PROVIDER_SITE_OTHER): Payer: Commercial Managed Care - HMO

## 2022-03-30 VITALS — Ht 62.0 in | Wt 200.0 lb

## 2022-03-30 DIAGNOSIS — Z0000 Encounter for general adult medical examination without abnormal findings: Secondary | ICD-10-CM

## 2022-03-30 NOTE — Patient Instructions (Signed)
Janet Le , Thank you for taking time to come for your Medicare Wellness Visit. I appreciate your ongoing commitment to your health goals. Please review the following plan we discussed and let me know if I can assist you in the future.   These are the goals we discussed:  Goals      Exercise 3x per week (30 min per time)     Manage My Medicine     Timeframe:  Long-Range Goal Priority:  High Start Date:    02/02/21                         Expected End Date:    08/05/21                   Follow Up Date Sept 2022   - call for medicine refill 2 or 3 days before it runs out - call if I am sick and can't take my medicine - keep a list of all the medicines I take; vitamins and herbals too -Utilize [facility] for medication synchronization, packaging and delivery     Why is this important?   These steps will help you keep on track with your medicines.   Notes:         This is a list of the screening recommended for you and due dates:  Health Maintenance  Topic Date Due   Yearly kidney health urinalysis for diabetes  01/05/2016   DEXA scan (bone density measurement)  06/10/2020   Pneumonia Vaccine (2 - PPSV23 or PCV20) 07/19/2020   COVID-19 Vaccine (4 - Pfizer risk series) 03/08/2021   Mammogram  11/25/2021   Hemoglobin A1C  12/31/2021   Flu Shot  01/23/2022   Complete foot exam   05/08/2022   Yearly kidney function blood test for diabetes  07/03/2022   Tetanus Vaccine  01/08/2023   Colon Cancer Screening  06/10/2024   Hepatitis C Screening: USPSTF Recommendation to screen - Ages 18-79 yo.  Completed   Zoster (Shingles) Vaccine  Completed   HPV Vaccine  Aged Out    Advanced directives: Advance directive discussed with you today. I have provided a copy for you to complete at home and have notarized. Once this is complete please bring a copy in to our office so we can scan it into your chart.   Conditions/risks identified: Aim for 30 minutes of exercise or brisk walking,  6-8 glasses of water, and 5 servings of fruits and vegetables each day.   Next appointment: Follow up in one year for your annual wellness visit    Preventive Care 65 Years and Older, Female Preventive care refers to lifestyle choices and visits with your health care provider that can promote health and wellness. What does preventive care include? A yearly physical exam. This is also called an annual well check. Dental exams once or twice a year. Routine eye exams. Ask your health care provider how often you should have your eyes checked. Personal lifestyle choices, including: Daily care of your teeth and gums. Regular physical activity. Eating a healthy diet. Avoiding tobacco and drug use. Limiting alcohol use. Practicing safe sex. Taking low-dose aspirin every day. Taking vitamin and mineral supplements as recommended by your health care provider. What happens during an annual well check? The services and screenings done by your health care provider during your annual well check will depend on your age, overall health, lifestyle risk factors, and family history of disease.  Counseling  Your health care provider may ask you questions about your: Alcohol use. Tobacco use. Drug use. Emotional well-being. Home and relationship well-being. Sexual activity. Eating habits. History of falls. Memory and ability to understand (cognition). Work and work Statistician. Reproductive health. Screening  You may have the following tests or measurements: Height, weight, and BMI. Blood pressure. Lipid and cholesterol levels. These may be checked every 5 years, or more frequently if you are over 71 years old. Skin check. Lung cancer screening. You may have this screening every year starting at age 80 if you have a 30-pack-year history of smoking and currently smoke or have quit within the past 15 years. Fecal occult blood test (FOBT) of the stool. You may have this test every year starting at  age 73. Flexible sigmoidoscopy or colonoscopy. You may have a sigmoidoscopy every 5 years or a colonoscopy every 10 years starting at age 19. Hepatitis C blood test. Hepatitis B blood test. Sexually transmitted disease (STD) testing. Diabetes screening. This is done by checking your blood sugar (glucose) after you have not eaten for a while (fasting). You may have this done every 1-3 years. Bone density scan. This is done to screen for osteoporosis. You may have this done starting at age 74. Mammogram. This may be done every 1-2 years. Talk to your health care provider about how often you should have regular mammograms. Talk with your health care provider about your test results, treatment options, and if necessary, the need for more tests. Vaccines  Your health care provider may recommend certain vaccines, such as: Influenza vaccine. This is recommended every year. Tetanus, diphtheria, and acellular pertussis (Tdap, Td) vaccine. You may need a Td booster every 10 years. Zoster vaccine. You may need this after age 55. Pneumococcal 13-valent conjugate (PCV13) vaccine. One dose is recommended after age 17. Pneumococcal polysaccharide (PPSV23) vaccine. One dose is recommended after age 69. Talk to your health care provider about which screenings and vaccines you need and how often you need them. This information is not intended to replace advice given to you by your health care provider. Make sure you discuss any questions you have with your health care provider. Document Released: 07/08/2015 Document Revised: 02/29/2016 Document Reviewed: 04/12/2015 Elsevier Interactive Patient Education  2017 [employer] Prevention in the Home Falls can cause injuries. They can happen to people of all ages. There are many things you can do to make your home safe and to help prevent falls. What can I do on the outside of my home? Regularly fix the edges of walkways and driveways and fix any  cracks. Remove anything that might make you trip as you walk through a door, such as a raised step or threshold. Trim any bushes or trees on the path to your home. Use bright outdoor lighting. Clear any walking paths of anything that might make someone trip, such as rocks or tools. Regularly check to see if handrails are loose or broken. Make sure that both sides of any steps have handrails. Any raised decks and porches should have guardrails on the edges. Have any leaves, snow, or ice cleared regularly. Use sand or salt on walking paths during winter. Clean up any spills in your garage right away. This includes oil or grease spills. What can I do in the bathroom? Use night lights. Install grab bars by the toilet and in the tub and shower. Do not use towel bars as grab bars. Use non-skid mats or decals in  the tub or shower. If you need to sit down in the shower, use a plastic, non-slip stool. Keep the floor dry. Clean up any water that spills on the floor as soon as it happens. Remove soap buildup in the tub or shower regularly. Attach bath mats securely with double-sided non-slip rug tape. Do not have throw rugs and other things on the floor that can make you trip. What can I do in the bedroom? Use night lights. Make sure that you have a light by your bed that is easy to reach. Do not use any sheets or blankets that are too big for your bed. They should not hang down onto the floor. Have a firm chair that has side arms. You can use this for support while you get dressed. Do not have throw rugs and other things on the floor that can make you trip. What can I do in the kitchen? Clean up any spills right away. Avoid walking on wet floors. Keep items that you use a lot in easy-to-reach places. If you need to reach something above you, use a strong step stool that has a grab bar. Keep electrical cords out of the way. Do not use floor polish or wax that makes floors slippery. If you must  use wax, use non-skid floor wax. Do not have throw rugs and other things on the floor that can make you trip. What can I do with my stairs? Do not leave any items on the stairs. Make sure that there are handrails on both sides of the stairs and use them. Fix handrails that are broken or loose. Make sure that handrails are as long as the stairways. Check any carpeting to make sure that it is firmly attached to the stairs. Fix any carpet that is loose or worn. Avoid having throw rugs at the top or bottom of the stairs. If you do have throw rugs, attach them to the floor with carpet tape. Make sure that you have a light switch at the top of the stairs and the bottom of the stairs. If you do not have them, ask someone to add them for you. What else can I do to help prevent falls? Wear shoes that: Do not have high heels. Have rubber bottoms. Are comfortable and fit you well. Are closed at the toe. Do not wear sandals. If you use a stepladder: Make sure that it is fully opened. Do not climb a closed stepladder. Make sure that both sides of the stepladder are locked into place. Ask someone to hold it for you, if possible. Clearly mark and make sure that you can see: Any grab bars or handrails. First and last steps. Where the edge of each step is. Use tools that help you move around (mobility aids) if they are needed. These include: Canes. Walkers. Scooters. Crutches. Turn on the lights when you go into a dark area. Replace any light bulbs as soon as they burn out. Set up your furniture so you have a clear path. Avoid moving your furniture around. If any of your floors are uneven, fix them. If there are any pets around you, be aware of where they are. Review your medicines with your doctor. Some medicines can make you feel dizzy. This can increase your chance of falling. Ask your doctor what other things that you can do to help prevent falls. This information is not intended to replace  advice given to you by your health care provider. Make sure you discuss  any questions you have with your health care provider. Document Released: 04/07/2009 Document Revised: 11/17/2015 Document Reviewed: 07/16/2014 Elsevier Interactive Patient Education  2017 [employer].

## 2022-03-30 NOTE — Progress Notes (Signed)
Subjective:   Janet Le is a 66 y.o. female who presents for an Initial Medicare Annual Wellness Visit.   Virtual Visit via Telephone Note  I connected with  Janet Le on 03/30/22 at  2:00 PM EDT by telephone and verified that I am speaking with the correct person using two identifiers.  Location: Patient: Home  Provider: Esmond Plants  Persons participating in the virtual visit: patient/Nurse Health Advisor   I discussed the limitations, risks, security and privacy concerns of performing an evaluation and management service by telephone and the availability of in person appointments. The patient expressed understanding and agreed to proceed.  Interactive audio and video telecommunications were attempted between this nurse and patient, however failed, due to patient having technical difficulties OR patient did not have access to video capability.  We continued and completed visit with audio only.  Some vital signs may be absent or patient reported.   Daphane Shepherd, LPN  Review of Systems     Cardiac Risk Factors include: advanced age (>12mn, >>89women);hypertension;diabetes mellitus;dyslipidemia     Objective:    Today's Vitals   03/30/22 1406  Weight: 200 lb (90.7 kg)  Height: _0  (1.575 m)   Body mass index is 36.58 kg/m.     03/30/2022    2:17 PM 09/07/2021   10:50 AM 08/30/2021   11:11 AM 06/03/2020    1:54 PM 04/29/2018    9:11 PM 05/20/2017    4:10 PM 02/28/2017   12:11 PM  Advanced Directives  Does Patient Have a Medical Advance Directive? No _1  No  Type of ACorporate treasurerof AStoddardLiving will;Out of facility DNR (pink MOST or yellow form)  [place] will Healthcare Power of [place]  Does patient want to make changes to medical advance directive?   Yes (MAU/Ambulatory/Procedural Areas - Information given)  No - Patient declined    Copy of [place]?     No - copy requested Yes   Would patient like information on creating a medical advance directive? No - Patient declined          Current Medications (verified) Outpatient Encounter Medications as of 03/30/2022  Medication Sig   Accu-Chek FastClix Lancets MISC USE AS DIRECTED UP  TO  1 TIME  DAILY- DX CODE R73.03   acetaminophen (TYLENOL) 325 MG tablet Take 1,300 mg by mouth at bedtime.   amLODipine (NORVASC) 5 MG tablet TAKE ONE TABLET BY MOUTH ONCE DAILY   atorvastatin (LIPITOR) 10 MG tablet TAKE ONE TABLET BY MOUTH ONCE DAILY   Blood Glucose Monitoring Suppl (ACCU-CHEK GUIDE ME) w/Device KIT Use to check blood sugar daily   calcium carbonate (OS-CAL) 600 MG TABS tablet Take 600 mg by mouth 2 (two) times daily with a meal.   clotrimazole (LOTRIMIN) 1 % cream SMARTSIG:1 Topical Daily PRN   dicyclomine (BENTYL) 10 MG capsule TAKE 1 TO 2 CAPSULES BY MOUTH TWICE DAILY (VIAL)   DULoxetine (CYMBALTA) 60 MG capsule TAKE ONE CAPSULE BY MOUTH DAILY AT 9AM   Elastic Bandages & Supports (MEDICAL COMPRESSION SOCKS) MISC Compression sock, medium compression 20-30 mmHg. Use daily for venous insufficiency I87.2   fluticasone (FLONASE) 50 MCG/ACT nasal spray Place 2 sprays into both nostrils daily.   folic acid (FOLVITE) 1 MG tablet Take 1 tablet (1 mg total) by mouth daily.   furosemide (LASIX) 20 MG tablet TAKE  ONE TABLET BY MOUTH DAILY AT 9AM   glucose blood (ACCU-CHEK GUIDE) test strip Use to check sugars once daily. Dx Code-R73.03   Incontinence Supply Disposable (COMFORT PROTECT ADULT DIAPER/L) MISC Use as directed for urinary incontinence   linaclotide (LINZESS) 145 MCG CAPS capsule Take 1 capsule (145 mcg total) by mouth daily before breakfast. **PLEASE CALL OFFICE TO SCHEDULE FOLLOW UP   methotrexate 2.5 MG tablet Take 10 mg by mouth once a week. Mondays   polyethylene glycol powder (GLYCOLAX/MIRALAX) 17 GM/SCOOP powder Take 17 g by mouth daily as needed for moderate  constipation.   propranolol (INDERAL) 80 MG tablet TAKE ONE TABLET BY MOUTH TWICE DAILY @ 9AM-5PM   QUEtiapine (SEROQUEL) 50 MG tablet Take 1 tablet (50 mg total) by mouth 2 (two) times daily.   rivastigmine (EXELON) 4.5 MG capsule TAKE ONE CAPSULE BY MOUTH TWICE DAILY @ 9AM & 5PM   telmisartan (MICARDIS) 80 MG tablet TAKE ONE TABLET BY MOUTH DAILY AT 9AM (ORIG)   Misc Natural Products (HEALTHY LIVER PO) Take by mouth. (Patient not taking: Reported on 03/30/2022)   Multiple Vitamin (MULTIVITAMIN) tablet Take 1 tablet by mouth daily. (Patient not taking: Reported on 03/30/2022)   No facility-administered encounter medications on file as of 03/30/2022.    Allergies (verified) Sertraline hcl, Ace inhibitors, Codeine, Darifenacin hydrobromide er, Gabapentin, Pravastatin, Propoxyphene hcl, Seroquel [quetiapine], and Wellbutrin [bupropion hcl]   History: Past Medical History:  Diagnosis Date   Allergic rhinitis 08/31/2009   Arthralgia of hip 11/14/2017   Attention deficit hyperactivity disorder 06/06/2020   diagnosed as adult w/o formal testing   Bilateral leg edema 06/12/2018   Blepharitis 11/07/2019   Blood dyscrasia    platelets low in past   Cataract    Cervical radiculopathy 09/07/2020   Cervicalgia 05/30/2016   Chronic low back pain 07/18/2016   Degeneration of lumbar intervertebral disc 01/09/2018   Dyspnea on exertion 05/11/2019   Endometriosis    Esophageal stricture    Essential hypertension 05/06/2009   Excessive daytime sleepiness 05/11/2019   External hemorrhoids 08/28/2010   Fatigue 11/14/2017   Generalized anxiety disorder 10/08/2017   GERD (gastroesophageal reflux disease)    Glaucoma 06/06/2020   Gout 12/11/2013   Hiatal hernia    Hypertensive retinopathy of both eyes 10/06/2019   IBS (irritable bowel syndrome) with chronic constipation 11/14/2010   Iridocyclitis 10/06/2019   Laryngopharyngeal reflux (LPR) 07/03/2018   Lattice degeneration of both retinas  10/06/2019   Left lumbar radiculopathy 04/13/2012   LVH (left ventricular hypertrophy), mild 05/18/2017   Echo 04/2017 - mild LVH, normal EF, Grade 1 DD   Lymphedema    Major depressive disorder 10/08/2017   Migraine 06/14/2009   Mild neurocognitive disorder with Lewy bodies 01/25/2021   Neuropathy 10/07/2017   Nocturnal hypoxemia 05/11/2019   Osteoarthritis    Osteopenia 11/18/2015   06/10/2018: LFN -1.1, left radius 0.7, low FRAX.  No significant change from 2017  dexa 10/2015: t score  Spine -1.3, dual femur -0.9  Took alendronate 2014-2019   Post traumatic stress disorder (PTSD)    Primary osteoarthritis involving multiple joints 03/10/2019   Bilateral hips, knees   Pseudophakia of both eyes 10/06/2019   Pure hypercholesterolemia 03/16/2010   Renal cell carcinoma    R cryoablation by IR 02/16/11, Dr. Kathlene Cote  Followed by Dr. Janee Morn  No evidence of residual disease on CT 12/13 and 12/14   Rib pain on left side 01/03/2021   S/P knee surgery 10/30/2016   Spondylolisthesis of  lumbar region 01/09/2018   Syncope and collapse 05/16/2009   since childhood; associated with extreme heat   TB lung, latent 11/25/2019   Thrombocytopenia 05/30/2015   Saw hem 02/2017 - mild, chronic - advised to stop protonix, no concerning cause, plan for pcp to monitor   Tinnitus of right ear 07/03/2018   Type 2 diabetes mellitus without complication, without long-term current use of insulin 10/06/2019   Visual hallucinations    Vulvar itching 01/03/2021   Past Surgical History:  Procedure Laterality Date   BREAST CYST EXCISION Bilateral over 10 years ago   No visable scar    CATARACT EXTRACTION, BILATERAL Bilateral 2020   COLONOSCOPY     fallopian tube removed     FUNCTIONAL ENDOSCOPIC SINUS SURGERY     HEMORRHOID BANDING     LASER ABLATION OF THE CERVIX     NASAL SINUS SURGERY     RENAL CRYOABLATION  02/16/11   R kidney due to [place] (IR procedure)   TOTAL KNEE ARTHROPLASTY Right 10/30/2016    Procedure: RIGHT TOTAL KNEE ARTHROPLASTY;  Surgeon: Meredith Pel, MD;  Location: [facility];  Service: Orthopedics;  Laterality: Right;   TUBAL LIGATION     UMBILICAL HERNIA REPAIR     Family History  Problem Relation Age of Onset   Diabetes Mother    Hypertension Mother    Hyperlipidemia Mother    Heart disease Mother    Stroke Mother    Kidney disease Mother    Thyroid disease Mother    Sleep apnea Mother    Obesity Mother    Memory loss Mother    Kidney disease Father    Prostate cancer Father    Alcoholism Father    Alcohol abuse Father    Multiple sclerosis Sister    Colitis Brother    Alcohol abuse Brother    Leukemia Brother    Alcohol abuse Daughter    Breast cancer Maternal Aunt    Breast cancer Cousin    Cancer Other    Asthma Other    Colon polyps Other    Colon cancer Neg Hx    Esophageal cancer Neg Hx    Pancreatic cancer Neg Hx    Rectal cancer Neg Hx    Stomach cancer Neg Hx    Social History   Socioeconomic History   Marital status: Legally Separated    Spouse name: Not on file   Number of children: 4   Years of education: 12   Highest education level: High school graduate  Occupational History   Occupation: Disabled  Tobacco Use   Smoking status: Never   Smokeless tobacco: Never  Vaping Use   Vaping Use: Never used  Substance and Sexual Activity   Alcohol use: No    Comment: rare   Drug use: No   Sexual activity: Never  Other Topics Concern   Not on file  Social History Narrative   Right handed   Lives in a two story apartment building    Social Determinants of Health   Financial Resource Strain: [place]  (03/30/2022)   Overall Financial Resource Strain (CARDIA)    Difficulty of Paying Living Expenses: Not hard at all  Food Insecurity: No Food Insecurity (03/30/2022)   Hunger Vital Sign    Worried About Running Out of Food in the Last Year: Never true    Ran Out of Food in the Last Year: Never true  Transportation Needs: No  Transportation Needs (03/30/2022)  PRAPARE - Hydrologist (Medical): No    Lack of Transportation (Non-Medical): No  Physical Activity: Inactive (03/30/2022)   Exercise Vital Sign    Days of Exercise per Week: 0 days    Minutes of Exercise per Session: 0 min  Stress: No Stress Concern Present (03/30/2022)   [place]    Feeling of Stress : Not at all  Social Connections: Moderately Isolated (03/30/2022)   Social Connection and Isolation Panel [NHANES]    Frequency of Communication with Friends and Family: More than three times a week    Frequency of Social Gatherings with Friends and Family: More than three times a week    Attends Religious Services: More than 4 times per year    Active Member of [employer] or Organizations: No    Attends Music therapist: Never    Marital Status: Separated    Tobacco Counseling Counseling given: Not Answered   Clinical Intake:  Pre-visit preparation completed: Yes  Pain : No/denies pain     Nutritional Risks: None Diabetes: No  How often do you need to have someone help you when you read instructions, pamphlets, or other written materials from your doctor or pharmacy?: 1 - Never  Diabetic?yes  Nutrition Risk Assessment:  Has the patient had any N/V/D within the last 2 months?  No  Does the patient have any non-healing wounds?  No  Has the patient had any unintentional weight loss or weight gain?  No   Diabetes:  Is the patient diabetic?  Yes  If diabetic, was a CBG obtained today?  No  Did the patient bring in their glucometer from home?  No  How often do you monitor your CBG's? Never .   Financial Strains and Diabetes Management:  Are you having any financial strains with the device, your supplies or your medication? No .  Does the patient want to be seen by Chronic Care Management for management of their diabetes?  No  Would  the patient like to be referred to a Nutritionist or for Diabetic Management?  No   Diabetic Exams:  Diabetic Eye Exam: Completed 03/2022 Diabetic Foot Exam: Overdue, Pt has been advised about the importance in completing this exam. Pt is scheduled for diabetic foot exam on next office visit .   Interpreter Needed?: No  Information entered by :: Jadene Pierini, LPN   Activities of Daily Living    03/30/2022    2:17 PM  In your present state of health, do you have any difficulty performing the following activities:  Hearing? 0  Vision? 0  Difficulty concentrating or making decisions? 0  Walking or climbing stairs? 0  Dressing or bathing? 0  Doing errands, shopping? 0  Preparing Food and eating ? N  Using the Toilet? N  In the past six months, have you accidently leaked urine? N  Do you have problems with loss of bowel control? N  Managing your Medications? N  Managing your Finances? N  Housekeeping or managing your Housekeeping? N    Patient Care Team: Binnie Rail, MD as PCP - General (Internal Medicine) Sable Feil, MD as Consulting Physician (Gastroenterology) Larey Dresser, MD as Consulting Physician (Cardiology) Izora Gala, MD as Consulting Physician (Otolaryngology) Jeannett Senior as Physician Assistant (Orthopedic Surgery) Juanda Chance, NP as Nurse Practitioner (Obstetrics and Gynecology) Franchot Gallo, MD as Consulting Physician (Urology) Aletta Edouard, MD as Consulting Physician (Radiology)  Sharol Roussel, [place] (Optometry) Pieter Partridge, DO (Neurology) Kirsteins, Luanna Salk, MD (Physical Medicine and Rehabilitation) Charlton Haws, [facility] as Pharmacist (Pharmacist)  Indicate any recent Medical Services you may have received from other than [facility] providers in the past year (date may be approximate).     Assessment:   This is a routine wellness examination for Janet Le.  Hearing/Vision screen Vision Screening - Comments:: Annual eye exam  wear glasses   Dietary issues and exercise activities discussed: Current Exercise Habits: The patient does not participate in regular exercise at present, Exercise limited by: orthopedic condition(s)   Goals Addressed             This Visit's Progress    Exercise 3x per week (30 min per time)         Depression Screen    03/30/2022    2:16 PM 03/30/2022    2:15 PM 10/11/2021    2:56 PM 08/30/2021   11:11 AM 01/16/2021   12:32 PM 07/12/2020   11:33 AM 11/25/2019    9:51 AM  PHQ 2/9 Scores  PHQ - 2 Score 0 0 2 0 4 5 0  PHQ- 9 Score   _0 Fall Risk    03/30/2022    2:07 PM 01/23/2022    2:45 PM 11/02/2021   11:30 AM 09/13/2021   11:30 AM 09/07/2021   10:50 AM  [place] in the past year? 1 1 0  0  Comment  continues to report falls; does not specify last fall, "not too long ago;" states using cane as indicated Patient tells me today she has not had any new or recent falls x 12 months; however, she is unable to specifically remember whe her last fall was; reports several "near falls;" uses cane/ walker "all the time" denies new/ recent falls; reports one near fall; using cane/ walker regularly   Number falls in past yr: 1 1 0  0  Injury with Fall? 1 0 0  0  Comment   N/A- no falls reported    Risk for fall due to : History of fall(s);Impaired balance/gait;Orthopedic patient History of fall(s) Impaired mobility;Mental status change;History of fall(s);Medication side effect Impaired mobility;History of fall(s)   Follow up Education provided;Falls prevention discussed Falls prevention discussed Falls prevention discussed Falls prevention discussed     FALL RISK PREVENTION PERTAINING TO THE HOME:  Any stairs in or around the home? No  If so, are there any without handrails? No  Home free of loose throw rugs in walkways, pet beds, electrical cords, etc? Yes  Adequate lighting in your home to reduce risk of falls? Yes   ASSISTIVE DEVICES UTILIZED TO PREVENT  FALLS:  Life alert? Yes  Use of a cane, walker or w/c? Yes  Grab bars in the bathroom? No  Shower chair or bench in shower? Yes  Elevated toilet seat or a handicapped toilet? No       06/16/2018   10:57 AM  MMSE - Mini Mental State Exam  Orientation to time 5  Orientation to Place 5  Registration 3  Attention/ Calculation 3  Recall 2  Language- name 2 objects 2  Language- repeat 1  Language- follow 3 step command 3  Language- read & follow direction 1  Write a sentence 1  Copy design 1  Total score 27        03/30/2022    2:17 PM  6CIT Screen  What Year? 0 points  What month? 0 points  What time? 0 points  Count back from 20 0 points  Months in reverse 2 points  Repeat phrase 2 points  Total Score 4 points    Immunizations Immunization History  Administered Date(s) Administered   Fluad Quad(high Dose 65+) 03/10/2019, 04/21/2021   Influenza Split 04/09/2012   Influenza Whole 03/25/2009, 03/15/2010   Influenza,inj,Quad PF,6+ Mos 07/08/2013, 07/19/2015, 05/30/2016, 04/05/2017, 03/27/2018, 04/06/2020   PFIZER(Purple Top)SARS-COV-2 Vaccination 09/15/2019, 10/06/2019, 01/11/2021   Pneumococcal Conjugate-13 07/01/2014   Tdap 01/07/2013   Zoster Recombinat (Shingrix) 06/06/2017, 10/04/2017    TDAP status: Up to date  Flu Vaccine status: Due, Education has been provided regarding the importance of this vaccine. Advised may receive this vaccine at local pharmacy or Health Dept. Aware to provide a copy of the vaccination record if obtained from local pharmacy or Health Dept. Verbalized acceptance and understanding.  Pneumococcal vaccine status: Up to date  Covid-19 vaccine status: Completed vaccines  Qualifies for Shingles Vaccine? Yes   Zostavax completed No   Shingrix Completed?: No.    Education has been provided regarding the importance of this vaccine. Patient has been advised to call insurance company to determine out of pocket expense if they have not yet  received this vaccine. Advised may also receive vaccine at local pharmacy or Health Dept. Verbalized acceptance and understanding.  Screening Tests Health Maintenance  Topic Date Due   Diabetic kidney evaluation - Urine ACR  01/05/2016   DEXA SCAN  06/10/2020   Pneumonia Vaccine 51+ Years old (2 - PPSV23 or PCV20) 07/19/2020   COVID-19 Vaccine (4 - Pfizer risk series) 03/08/2021   MAMMOGRAM  11/25/2021   HEMOGLOBIN A1C  12/31/2021   INFLUENZA VACCINE  01/23/2022   FOOT EXAM  05/08/2022   Diabetic kidney evaluation - GFR measurement  07/03/2022   TETANUS/TDAP  01/08/2023   COLONOSCOPY (Pts 45-26yr Insurance coverage will need to be confirmed)  06/10/2024   Hepatitis C Screening  Completed   Zoster Vaccines- Shingrix  Completed   HPV VACCINES  Aged Out    Health Maintenance  Health Maintenance Due  Topic Date Due   Diabetic kidney evaluation - Urine ACR  01/05/2016   DEXA SCAN  06/10/2020   Pneumonia Vaccine 66 Years old (2 - PPSV23 or PCV20) 07/19/2020   COVID-19 Vaccine (4 - Pfizer risk series) 03/08/2021   MAMMOGRAM  11/25/2021   HEMOGLOBIN A1C  12/31/2021   INFLUENZA VACCINE  01/23/2022    Colorectal cancer screening: Type of screening: Colonoscopy. Completed 06/11/2019. Repeat every 5 years  Mammogram status: Ordered scheduled 04/04/2022. Pt provided with contact info and advised to call to schedule appt.   Bone Density status: Ordered scheduled 04/04/2022. Pt provided with contact info and advised to call to schedule appt.  Lung Cancer Screening: (Low Dose CT Chest recommended if Age 66-80years, 30 pack-year currently smoking OR have quit w/in 15years.) does not qualify.   Lung Cancer Screening Referral: n/a  Additional Screening:  Hepatitis C Screening: does not qualify;   Vision Screening: Recommended annual ophthalmology exams for early detection of glaucoma and other disorders of the eye. Is the patient up to date with their annual eye exam?  Yes  Who is  the provider or what is the name of the office in which the patient attends annual eye exams? Dr.Groat  If pt is not established with a provider, would they like to be referred to a provider to establish care? No .  Dental Screening: Recommended annual dental exams for proper oral hygiene  Community Resource Referral / Chronic Care Management: CRR required this visit?  No   CCM required this visit?  No      Plan:     I have personally reviewed and noted the following in the patient's chart:   Medical and social history Use of alcohol, tobacco or illicit drugs  Current medications and supplements including opioid prescriptions. Patient is not currently taking opioid prescriptions. Functional ability and status Nutritional status Physical activity Advanced directives List of other physicians Hospitalizations, surgeries, and ER visits in previous 12 months Vitals Screenings to include cognitive, depression, and falls Referrals and appointments  In addition, I have reviewed and discussed with patient certain preventive protocols, quality metrics, and best practice recommendations. A written personalized care plan for preventive services as well as general preventive health recommendations were provided to patient.     Daphane Shepherd, LPN   [id number]   Nurse Notes: Due Flu Vaccine

## 2022-04-04 ENCOUNTER — Ambulatory Visit
Admission: RE | Admit: 2022-04-04 | Discharge: 2022-04-04 | Disposition: A | Discharge status: Home / Self Care | Payer: Medicare (Managed Care) | Source: Ambulatory Visit | Attending: Internal Medicine | Admitting: Internal Medicine

## 2022-04-04 ENCOUNTER — Other Ambulatory Visit: Payer: Self-pay | Admitting: Internal Medicine

## 2022-04-04 DIAGNOSIS — Z1231 Encounter for screening mammogram for malignant neoplasm of breast: Secondary | ICD-10-CM

## 2022-04-04 DIAGNOSIS — M8589 Other specified disorders of bone density and structure, multiple sites: Secondary | ICD-10-CM

## 2022-04-05 LAB — HM DIABETES EYE EXAM

## 2022-04-08 ENCOUNTER — Encounter: Payer: Self-pay | Admitting: Internal Medicine

## 2022-04-08 NOTE — Patient Instructions (Addendum)
   Follow up with Dr Everlena Cooper.     Pneumonia and flu vaccines today.  Blood work was ordered.     Medications changes include :   freestyle sensor/receiver.  Clotrimazole for under the breasts, steroid cream for hemorrhoids.  Restart seroquel 25 mg twice daily   Your prescription(s) have been sent to your pharmacy.    A referral was ordered for pulmonary and psychiatry.     Someone from that office will call you to schedule an appointment.    Return in about 6 months (around 10/09/2022) for follow up.    [facility] Phone : [phone]

## 2022-04-08 NOTE — Progress Notes (Unsigned)
Subjective:    Patient ID: Janet Le, female    DOB: [birth year], 66 y.o.   MRN: [id number]     HPI Zain is here for follow up of her chronic medical problems, including htn, DM, hld, lewy body dementia, IBS w/ constipation, anxiety, depression  Would like to see gyn -she was under the impression that she had syphilis in the past and wondered if she needed to see GYN.  After reviewing chart she was talking about latent tuberculosis that was treated.  Would like freestyle libre.  Needs sleep eval-Dr. Katy Fitch recommended being evaluated for sleep disorder.  She states she did have one in the past.  She does have trouble sleeping and wonders about a sleep medication.  She sometimes can go to bed at 730 or 8:00 or 9 at night and her sleep is very broken up.  She does sometimes take a nap during the day.  Sometimes she sleeps until 10 AM.   [place] two months ago still having neck pain on left side.  Uses topical gel medication.    Rash under breasts - moisture, red, not itchy.  She needs a renewal of the cream that she uses-that does help.  Her daughter states that she has had a couple episodes of agitation and wondered about her being on clonazepam.  She was paranoid 1 night but has had a couple episodes of that and they were not sure what to do.    Medications and allergies reviewed with patient and updated if appropriate.  Current Outpatient Medications on File Prior to Visit  Medication Sig Dispense Refill   Accu-Chek FastClix Lancets MISC USE AS DIRECTED UP  TO  1 TIME  DAILY- DX CODE R73.03 100 each 1   acetaminophen (TYLENOL) 325 MG tablet Take 1,300 mg by mouth at bedtime.     amLODipine (NORVASC) 5 MG tablet TAKE ONE TABLET BY MOUTH ONCE DAILY 90 tablet 0   atorvastatin (LIPITOR) 10 MG tablet TAKE ONE TABLET BY MOUTH ONCE DAILY 90 tablet 0   Blood Glucose Monitoring Suppl (ACCU-CHEK GUIDE ME) w/Device KIT Use to check blood sugar daily 1 kit 0   calcium carbonate  (OS-CAL) 600 MG TABS tablet Take 600 mg by mouth 2 (two) times daily with a meal.     clotrimazole (LOTRIMIN) 1 % cream SMARTSIG:1 Topical Daily PRN     dicyclomine (BENTYL) 10 MG capsule TAKE 1 TO 2 CAPSULES BY MOUTH TWICE DAILY (VIAL) 360 capsule 11   DULoxetine (CYMBALTA) 60 MG capsule TAKE ONE CAPSULE BY MOUTH DAILY AT 9AM 90 capsule 1   Elastic Bandages & Supports (MEDICAL COMPRESSION SOCKS) MISC Compression sock, medium compression 20-30 mmHg. Use daily for venous insufficiency I87.2 3 each 0   fluticasone (FLONASE) 50 MCG/ACT nasal spray INSTILL 2 SPRAYS IN EACH NOSTRIL DAILY (BULK) 16 g 11   folic acid (FOLVITE) 1 MG tablet TAKE ONE TABLET BY MOUTH DAILY AT 9AM 240 tablet 11   furosemide (LASIX) 20 MG tablet TAKE ONE TABLET BY MOUTH DAILY AT 9AM 60 tablet 11   glucose blood (ACCU-CHEK GUIDE) test strip Use to check sugars once daily. Dx Code-R73.03 100 each 1   Incontinence Supply Disposable (COMFORT PROTECT ADULT DIAPER/L) MISC Use as directed for urinary incontinence 90 each 5   linaclotide (LINZESS) 145 MCG CAPS capsule Take 1 capsule (145 mcg total) by mouth daily before breakfast. **PLEASE CALL OFFICE TO SCHEDULE FOLLOW UP 90 capsule 0   methotrexate 2.5  MG tablet Take 10 mg by mouth once a week. Mondays     polyethylene glycol powder (GLYCOLAX/MIRALAX) 17 GM/SCOOP powder Take 17 g by mouth daily as needed for moderate constipation. 255 g 11   propranolol (INDERAL) 80 MG tablet TAKE ONE TABLET BY MOUTH TWICE DAILY @ 9AM-5PM 120 tablet 5   QUEtiapine (SEROQUEL) 50 MG tablet Take 1 tablet (50 mg total) by mouth 2 (two) times daily. 60 tablet 4   rivastigmine (EXELON) 4.5 MG capsule TAKE ONE CAPSULE BY MOUTH TWICE DAILY @ 9AM & 5PM 60 capsule 2   telmisartan (MICARDIS) 80 MG tablet TAKE ONE TABLET BY MOUTH DAILY AT 9AM (ORIG) 60 tablet 5   Olopatadine HCl 0.2 % SOLN INSTILL 1 DROP INTO EACH EYE ONCE DAILY     No current facility-administered medications on file prior to visit.      Review of Systems  Constitutional:  Negative for chills and fever.  Eyes:  Negative for visual disturbance.  Respiratory:  Positive for cough (at times from PND). Negative for shortness of breath and wheezing.   Cardiovascular:  Positive for leg swelling (chronic). Negative for chest pain and palpitations.  Gastrointestinal:        Gerd  Neurological:  Positive for light-headedness (occ). Negative for headaches.  Psychiatric/Behavioral:  Positive for sleep disturbance.        Objective:   Vitals:   04/09/22 1356  BP: (!) 148/72  Pulse: 64  Temp: 98.1 F (36.7 C)  SpO2: 99%   BP Readings from Last 3 Encounters:  04/09/22 (!) 148/72  10/11/21 (!) 142/94  09/07/21 107/70   Wt Readings from Last 3 Encounters:  04/09/22 223 lb (101.2 kg)  03/30/22 200 lb (90.7 kg)  10/11/21 222 lb (100.7 kg)   Body mass index is 40.79 kg/m.    Physical Exam Constitutional:      General: She is not in acute distress.    Appearance: Normal appearance.  HENT:     Head: Normocephalic and atraumatic.  Eyes:     Conjunctiva/sclera: Conjunctivae normal.  Cardiovascular:     Rate and Rhythm: Normal rate and regular rhythm.     Heart sounds: Normal heart sounds. No murmur heard. Pulmonary:     Effort: Pulmonary effort is normal. No respiratory distress.     Breath sounds: Normal breath sounds. No wheezing.  Musculoskeletal:     Cervical back: Neck supple.     Right lower leg: No edema.     Left lower leg: No edema.  Lymphadenopathy:     Cervical: No cervical adenopathy.  Skin:    General: Skin is warm and dry.     Findings: No rash.  Neurological:     Mental Status: She is alert. Mental status is at baseline.  Psychiatric:        Mood and Affect: Mood normal.        Behavior: Behavior normal.        Lab Results  Component Value Date   WBC 8.1 07/03/2021   HGB 12.6 07/03/2021   HCT 38.6 07/03/2021   PLT 125.0 (L) 07/03/2021   GLUCOSE 89 07/03/2021   CHOL 168  07/03/2021   TRIG 71.0 07/03/2021   HDL 59.80 07/03/2021   LDLDIRECT 143.8 07/01/2012   LDLCALC 94 07/03/2021   ALT 13 07/03/2021   AST 19 07/03/2021   NA 140 07/03/2021   K 3.8 07/03/2021   CL 107 07/03/2021   CREATININE 0.91 07/03/2021   BUN 12 07/03/2021  CO2 27 07/03/2021   TSH 2.46 04/21/2021   INR 0.91 02/08/2011   HGBA1C 5.7 07/03/2021   MICROALBUR 1.0 01/05/2015     Assessment & Plan:    See Problem List for Assessment and Plan of chronic medical problems.

## 2022-04-09 ENCOUNTER — Ambulatory Visit (INDEPENDENT_AMBULATORY_CARE_PROVIDER_SITE_OTHER): Payer: 59 | Admitting: Internal Medicine

## 2022-04-09 VITALS — BP 148/72 | HR 64 | Temp 98.1°F | Ht 62.0 in | Wt 223.0 lb

## 2022-04-09 DIAGNOSIS — I10 Essential (primary) hypertension: Secondary | ICD-10-CM

## 2022-04-09 DIAGNOSIS — G3183 Dementia with Lewy bodies: Secondary | ICD-10-CM

## 2022-04-09 DIAGNOSIS — F3289 Other specified depressive episodes: Secondary | ICD-10-CM

## 2022-04-09 DIAGNOSIS — F02A0 Dementia in other diseases classified elsewhere, mild, without behavioral disturbance, psychotic disturbance, mood disturbance, and anxiety: Secondary | ICD-10-CM

## 2022-04-09 DIAGNOSIS — E782 Mixed hyperlipidemia: Secondary | ICD-10-CM | POA: Diagnosis not present

## 2022-04-09 DIAGNOSIS — F411 Generalized anxiety disorder: Secondary | ICD-10-CM

## 2022-04-09 DIAGNOSIS — Z23 Encounter for immunization: Secondary | ICD-10-CM | POA: Diagnosis not present

## 2022-04-09 DIAGNOSIS — R0683 Snoring: Secondary | ICD-10-CM

## 2022-04-09 DIAGNOSIS — K581 Irritable bowel syndrome with constipation: Secondary | ICD-10-CM

## 2022-04-09 DIAGNOSIS — Z9289 Personal history of other medical treatment: Secondary | ICD-10-CM

## 2022-04-09 DIAGNOSIS — E119 Type 2 diabetes mellitus without complications: Secondary | ICD-10-CM

## 2022-04-09 HISTORY — DX: Personal history of other medical treatment: Z92.89

## 2022-04-09 HISTORY — DX: Snoring: R06.83

## 2022-04-09 LAB — CBC WITH DIFFERENTIAL/PLATELET
Basophils Absolute: 0 10*3/uL (ref 0.0–0.1)
Basophils Relative: 0.5 % (ref 0.0–3.0)
Eosinophils Absolute: 0.1 10*3/uL (ref 0.0–0.7)
Eosinophils Relative: 1 % (ref 0.0–5.0)
HCT: 41.4 % (ref 36.0–46.0)
Hemoglobin: 13.5 g/dL (ref 12.0–15.0)
Lymphocytes Relative: 29.5 % (ref 12.0–46.0)
Lymphs Abs: 2.1 10*3/uL (ref 0.7–4.0)
MCHC: 32.7 g/dL (ref 30.0–36.0)
MCV: 95.9 fl (ref 78.0–100.0)
Monocytes Absolute: 0.5 10*3/uL (ref 0.1–1.0)
Monocytes Relative: 6.7 % (ref 3.0–12.0)
Neutro Abs: 4.5 10*3/uL (ref 1.4–7.7)
Neutrophils Relative %: 62.3 % (ref 43.0–77.0)
Platelets: 136 10*3/uL — ABNORMAL LOW (ref 150.0–400.0)
RBC: 4.31 Mil/uL (ref 3.87–5.11)
RDW: 14.3 % (ref 11.5–15.5)
WBC: 7.2 10*3/uL (ref 4.0–10.5)

## 2022-04-09 LAB — LIPID PANEL
Cholesterol: 249 mg/dL — ABNORMAL HIGH (ref 0–200)
HDL: 65.3 mg/dL (ref >39.00)
LDL Cholesterol: 165 mg/dL — ABNORMAL HIGH (ref 0–99)
NonHDL: 184.08
Total CHOL/HDL Ratio: 4
Triglycerides: 95 mg/dL (ref 0.0–149.0)
VLDL: 19 mg/dL (ref 0.0–40.0)

## 2022-04-09 LAB — COMPREHENSIVE METABOLIC PANEL
ALT: 9 U/L (ref 0–35)
AST: 16 U/L (ref 0–37)
Albumin: 4 g/dL (ref 3.5–5.2)
Alkaline Phosphatase: 100 U/L (ref 39–117)
BUN: 7 mg/dL (ref 6–23)
CO2: 29 mEq/L (ref 19–32)
Calcium: 9.9 mg/dL (ref 8.4–10.5)
Chloride: 104 mEq/L (ref 96–112)
Creatinine, Ser: 0.84 mg/dL (ref 0.40–1.20)
GFR: 72.26 mL/min (ref >60.00)
Glucose, Bld: 93 mg/dL (ref 70–99)
Potassium: 3.8 mEq/L (ref 3.5–5.1)
Sodium: 140 mEq/L (ref 135–145)
Total Bilirubin: 0.5 mg/dL (ref 0.2–1.2)
Total Protein: 7.4 g/dL (ref 6.0–8.3)

## 2022-04-09 MED ORDER — FREESTYLE LIBRE 14 DAY SENSOR MISC: 5 refills | Status: DC

## 2022-04-09 MED ORDER — FREESTYLE LIBRE 14 DAY READER DEVI: 0 refills | Status: DC

## 2022-04-09 MED ORDER — CLOTRIMAZOLE 1 % EX CREA: TOPICAL_CREAM | CUTANEOUS | 1 refills | Status: DC

## 2022-04-09 MED ORDER — HYDROCORT-PRAMOXINE (PERIANAL) 2.5-1 % EX CREA: 1.0000 | TOPICAL_CREAM | Freq: Three times a day (TID) | CUTANEOUS | 1 refills | Status: DC

## 2022-04-09 MED ORDER — QUETIAPINE FUMARATE 25 MG PO TABS: 25.0000 mg | ORAL_TABLET | Freq: Two times a day (BID) | ORAL | 2 refills | Status: DC

## 2022-04-09 NOTE — Assessment & Plan Note (Signed)
Chronic ?  Truly well controlled She is not a good candidate for benzodiazepines Restarting Seroquel 25 mg twice daily, which will hopefully help Continue Cymbalta 60 mg daily Referral ordered for psychiatry and psychology

## 2022-04-09 NOTE — Assessment & Plan Note (Signed)
Chronic Lab Results  Component Value Date   HGBA1C 5.7 07/03/2021   Sugars have been controlled with diet Continue diabetic diet Encouraged regular exercise

## 2022-04-09 NOTE — Assessment & Plan Note (Signed)
Chronic Blood pressure well controlled CMP Continue amlodipine 5 mg once daily, propranolol 80 mg twice daily, telmisartan 80 mg nightly

## 2022-04-09 NOTE — Assessment & Plan Note (Signed)
Chronic Following with GI On Linzess and MiraLAX

## 2022-04-09 NOTE — Assessment & Plan Note (Signed)
Chronic Following with Dr. Tomi Likens Not currently taking Seroquel Has had some episodes of agitation, paranoia, anxiety Currently on Cymbalta 60 mg daily-continue Restart Seroquel at 25 mg twice daily-discussed that we can titrate if tolerated If not tolerated discussed that we can try a different medication Advised follow-up with Dr. Tomi Likens

## 2022-04-09 NOTE — Assessment & Plan Note (Signed)
Chronic Regular exercise and healthy diet encouraged Check lipid panel  Continue atorvastatin 10 mg daily 

## 2022-04-09 NOTE — Assessment & Plan Note (Addendum)
Chronic Controlled, but does have some depression at times Is interested in seeing psychiatry and psychology-referral ordered Continue Cymbalta 60 mg daily

## 2022-04-09 NOTE — Assessment & Plan Note (Signed)
Chronic Has seen pulmonary in the past and had a sleep study Dr. Katy Fitch advised that she have another sleep evaluation to rule out sleep apnea She is not sleeping well-discussed sleep hygiene-advised to go to bed at the same time and not too early.  Advise getting up at the same time.  Advised avoiding naps during the day.  Advised increasing exercise during the day Restarting Seroquel 25 mg twice daily-she states this is not help with sleep in the past Referral order for pulmonary to rule out sleep apnea

## 2022-04-10 ENCOUNTER — Telehealth: Payer: Self-pay | Admitting: Neurology

## 2022-04-10 LAB — HEMOGLOBIN A1C: Hgb A1c MFr Bld: 5.7 % (ref 4.6–6.5)

## 2022-04-10 LAB — MICROALBUMIN / CREATININE URINE RATIO
Creatinine,U: 68.7 mg/dL
Microalb Creat Ratio: 1.2 mg/g (ref 0.0–30.0)
Microalb, Ur: 0.8 mg/dL (ref 0.0–1.9)

## 2022-04-10 NOTE — Telephone Encounter (Signed)
Pt is still having reaction to seroquel. She saw her PCP but wants to speak with dr.jaffe about the reaction. Pt stated dr.jaffe is aware of the reaction. She wants to know what can be done

## 2022-04-11 NOTE — Telephone Encounter (Signed)
LMOVM to call the office back.

## 2022-04-12 ENCOUNTER — Other Ambulatory Visit: Payer: Self-pay | Admitting: Internal Medicine

## 2022-04-12 MED ORDER — ATORVASTATIN CALCIUM 10 MG PO TABS: 10.0000 mg | ORAL_TABLET | Freq: Every day | ORAL | 3 refills | Status: DC

## 2022-04-19 NOTE — Telephone Encounter (Signed)
Telephone call to patient, Per Patient while taking the Seroquel she experienced hallinations and screaming in her sleep.

## 2022-04-20 MED ORDER — OLANZAPINE 5 MG PO TABS: 5.0000 mg | ORAL_TABLET | Freq: Every day | ORAL | 0 refills | Status: DC

## 2022-04-20 NOTE — Telephone Encounter (Signed)
Have her stop Seroquel.  Instead, please send prescription for olanzapine '5mg'$  daily.  Patient advised.

## 2022-04-25 ENCOUNTER — Other Ambulatory Visit: Payer: Self-pay | Admitting: Internal Medicine

## 2022-04-25 ENCOUNTER — Telehealth: Payer: Self-pay | Admitting: Anesthesiology

## 2022-04-25 DIAGNOSIS — F411 Generalized anxiety disorder: Secondary | ICD-10-CM

## 2022-04-25 DIAGNOSIS — R251 Tremor, unspecified: Secondary | ICD-10-CM

## 2022-04-25 NOTE — Telephone Encounter (Signed)
Per Dr.Jaffe, At this point, I would have to refer her to psychiatry.  Also, for further evaluation of Lewy Body dementia, I would like to order a DaT scan.   Patient and daughter advised of referral.

## 2022-04-25 NOTE — Telephone Encounter (Signed)
Patient called and said the Olanzapine is not working well for her and would like to try something different. Patient said the medication keeps her awake all night. Requests a call back.

## 2022-04-25 NOTE — Telephone Encounter (Signed)
Per Patient since starting the Olanzapine on 10/28 she is unable to sleep.

## 2022-04-25 NOTE — Telephone Encounter (Signed)
Tried calling patient, No answer. LMOVM.

## 2022-04-30 NOTE — Telephone Encounter (Signed)
Pt called back in and left a message returning [facility] call

## 2022-04-30 NOTE — Telephone Encounter (Signed)
Patient called back again, LM to return call.

## 2022-05-03 ENCOUNTER — Encounter: Payer: Medicare (Managed Care) | Admitting: Psychology

## 2022-05-03 NOTE — Progress Notes (Signed)
Paper PA started for [employer], Faxed to [facility]

## 2022-05-07 ENCOUNTER — Other Ambulatory Visit: Payer: Self-pay | Admitting: Physician Assistant

## 2022-05-07 ENCOUNTER — Telehealth: Payer: Self-pay | Admitting: Internal Medicine

## 2022-05-07 NOTE — Telephone Encounter (Signed)
PT visits today with forms regarding the Ozempic suggestion. PT had gone through [facility] and had their vitals checked. Forms are currently in Dr.Burns' mailbox.   PT had also brought up possibly getting approval set up for transportation through [employer] and was wanting to know the processes for getting approved for this.  CB: [phone] CB for daughter: [phone]

## 2022-05-07 NOTE — Telephone Encounter (Signed)
Pt is requesting a call from [facility] about starting Ozempic medication for weight loss and a device to help monitor her Blood Pressure that connects to her phone, she couldn't remember the name of the device.   States she talked to Dr. Quay Burow about these 2 things during her last visit (04-09-22)  Please call pt at: [phone]

## 2022-05-07 NOTE — Telephone Encounter (Signed)
Please advise 

## 2022-05-09 ENCOUNTER — Other Ambulatory Visit: Payer: Self-pay | Admitting: Neurology

## 2022-05-09 NOTE — Telephone Encounter (Signed)
Patient called back to follow up and ask if Angela Nevin can please give her a call back at [phone]

## 2022-05-10 ENCOUNTER — Telehealth: Payer: Self-pay

## 2022-05-10 ENCOUNTER — Encounter: Payer: Medicare (Managed Care) | Admitting: Psychology

## 2022-05-10 ENCOUNTER — Other Ambulatory Visit: Payer: Self-pay

## 2022-05-10 DIAGNOSIS — E119 Type 2 diabetes mellitus without complications: Secondary | ICD-10-CM

## 2022-05-10 MED ORDER — FREESTYLE LIBRE 14 DAY READER DEVI: 0 refills | Status: DC

## 2022-05-10 MED ORDER — FREESTYLE LIBRE 14 DAY SENSOR MISC: 5 refills | Status: DC

## 2022-05-10 NOTE — Telephone Encounter (Signed)
Paperwork placed up front for pick up.  Previous forms were emailed.

## 2022-05-10 NOTE — Telephone Encounter (Signed)
Ok  - rx pending - not sure where to send it

## 2022-05-11 MED ORDER — SEMAGLUTIDE(0.25 OR 0.5MG/DOS) 2 MG/3ML ~~LOC~~ SOPN: PEN_INJECTOR | SUBCUTANEOUS | 1 refills | Status: DC

## 2022-05-11 NOTE — Telephone Encounter (Signed)
Script sent to [facility].  

## 2022-05-14 ENCOUNTER — Ambulatory Visit (HOSPITAL_COMMUNITY): Payer: Medicare (Managed Care) | Admitting: Student in an Organized Health Care Education/Training Program

## 2022-05-14 ENCOUNTER — Telehealth: Payer: Self-pay | Admitting: Anesthesiology

## 2022-05-14 NOTE — Telephone Encounter (Signed)
Patient called stating she would like for Dr Tomi Likens to increase her Olanzapine medication. States she takes 10 mg at night she would like a higher dose.

## 2022-05-14 NOTE — Telephone Encounter (Signed)
Per Patient she was given 10 mg Olanzapine the last time she spoke to [place] and she will like to have it increased. Per Patient it helps her go to sleep but it want allow her to stay asleep.   Advised patient of conversation on 04/25/22 that At this point, I would have to refer her to psychiatry Per Dr.Jaffe. Referral added and patient and daughter was advised. As for the dosing patient was advised on 04/20/22, Per Dr.jaffe,: Have her stop Seroquel.  Instead, please send prescription for olanzapine '5mg'$  daily.   Patient advised.  Per Patient she has the bottle to prove it. Advised patient we will call the pharmacy to see what happened we sent in 5 mg.  Spoke to [facility] and was advised per their system patient was given 5 mg Olanzapine.    Patient advised of this and to have her call me back with her daughter on the line to go over this and the new test Dr.Jaffe would like to do since her insurance will not cover the [place].  Dr.Jaffe would like to do a Skin Biopsy.

## 2022-05-15 NOTE — Telephone Encounter (Signed)
Tried calling patient to see if she will permission to call daughter to go over test needed so we can go ahead and start the paperwork process to check insurance. No answer. Voice mail full unable to LVM.

## 2022-05-21 NOTE — Telephone Encounter (Signed)
LMOVm for patient to call back with her daughter on the line as well or please allow [place] to go over the testing with her.

## 2022-05-22 ENCOUNTER — Telehealth: Payer: Self-pay

## 2022-05-22 NOTE — Telephone Encounter (Signed)
Janet Le ([name]) Rx #: [id number] Ozempic (0.25 or 0.5 MG/DOSE) '2MG'$ /3ML pen-injectors   Message from Plan Approved. This drug has been approved under the Member's Medicare Part D benefit. Approved quantity: 3 units per 42 day(s). You may fill up to a 90 day supply except for those on Specialty Tier 5, which can be filled up to a 30 day supply. Please call the pharmacy to process the prescription claim.

## 2022-05-23 ENCOUNTER — Ambulatory Visit: Payer: Medicare (Managed Care) | Admitting: Neurology

## 2022-05-25 ENCOUNTER — Ambulatory Visit (INDEPENDENT_AMBULATORY_CARE_PROVIDER_SITE_OTHER): Payer: 59 | Admitting: Pulmonary Disease

## 2022-05-25 ENCOUNTER — Other Ambulatory Visit: Payer: Self-pay | Admitting: Internal Medicine

## 2022-05-25 ENCOUNTER — Other Ambulatory Visit: Payer: Self-pay | Admitting: Neurology

## 2022-05-25 ENCOUNTER — Encounter: Payer: Self-pay | Admitting: Pulmonary Disease

## 2022-05-25 VITALS — BP 122/70 | HR 60 | Temp 98.0°F | Ht 61.0 in | Wt 226.0 lb

## 2022-05-25 DIAGNOSIS — R0683 Snoring: Secondary | ICD-10-CM

## 2022-05-25 MED ORDER — RAMELTEON 8 MG PO TABS: 8.0000 mg | ORAL_TABLET | Freq: Every day | ORAL | 3 refills | Status: DC

## 2022-05-25 NOTE — Progress Notes (Signed)
Janet Le    [id number]    Feb 13, 1956  Primary Care Physician:Burns, Claudina Lick, MD  Referring Physician: Binnie Rail, MD [place],  [address] [id number]  Chief complaint:   Patient seen previously for shortness of breath Past history of obstructive sleep apnea  HPI:  Still describes shortness of breath on exertion  Usually goes to bed about 8 30-9, might take Some time to fall asleep Will wake up about 3 AM and will be in and out bed till about 11 before she finally gets out of bed completely  Not able to fall asleep well without medications Had recently been on Seroquel but this was discontinued she tried another medication  She had used CPAP in the past but did have repeat studies that were negative  Never smoker  She does have occasional dryness of her mouth in the morning Occasional headaches   Outpatient Encounter Medications as of 05/25/2022  Medication Sig   Accu-Chek FastClix Lancets MISC USE AS DIRECTED UP  TO  1 TIME  DAILY- DX CODE R73.03   acetaminophen (TYLENOL) 325 MG tablet Take 1,300 mg by mouth at bedtime.   amLODipine (NORVASC) 5 MG tablet TAKE ONE TABLET BY MOUTH ONCE DAILY   atorvastatin (LIPITOR) 10 MG tablet Take 1 tablet (10 mg total) by mouth daily.   Blood Glucose Monitoring Suppl (ACCU-CHEK GUIDE ME) w/Device KIT Use to check blood sugar daily   calcium carbonate (OS-CAL) 600 MG TABS tablet Take 600 mg by mouth 2 (two) times daily with a meal.   clotrimazole (LOTRIMIN) 1 % cream Apply topically under breasts daily PRN   Continuous Blood Gluc Receiver (FREESTYLE LIBRE 14 DAY READER) DEVI UAD to check sugars.  E11.9   Continuous Blood Gluc Sensor (FREESTYLE LIBRE 14 DAY SENSOR) MISC UAD to check sugars.  E11.9   dicyclomine (BENTYL) 10 MG capsule TAKE 1 TO 2 CAPSULES BY MOUTH TWICE DAILY (VIAL)   DULoxetine (CYMBALTA) 60 MG capsule TAKE ONE CAPSULE BY MOUTH DAILY AT 9AM   Elastic Bandages & Supports (MEDICAL  COMPRESSION SOCKS) MISC Compression sock, medium compression 20-30 mmHg. Use daily for venous insufficiency I87.2   fluticasone (FLONASE) 50 MCG/ACT nasal spray INSTILL 2 SPRAYS IN EACH NOSTRIL DAILY (BULK)   folic acid (FOLVITE) 1 MG tablet TAKE ONE TABLET BY MOUTH DAILY AT 9AM   furosemide (LASIX) 20 MG tablet TAKE ONE TABLET BY MOUTH DAILY AT 9AM   glucose blood (ACCU-CHEK GUIDE) test strip Use to check sugars once daily. Dx Code-R73.03   hydrocortisone-pramoxine (ANALPRAM HC SINGLES) 2.5-1 % rectal cream Place 1 Application rectally 3 (three) times daily.   Incontinence Supply Disposable (COMFORT PROTECT ADULT DIAPER/L) MISC Use as directed for urinary incontinence   LINZESS 145 MCG CAPS capsule TAKE ONE CAPSULE BY MOUTH DAILY BEFORE BREAKFAST **PLEASE CALL OFFICE TO SCHEDULE FOLLOW UP (ORIG)   methotrexate 2.5 MG tablet Take 10 mg by mouth once a week. Mondays   OLANZapine (ZYPREXA) 5 MG tablet Take 1 tablet (5 mg total) by mouth at bedtime.   Olopatadine HCl 0.2 % SOLN INSTILL 1 DROP INTO EACH EYE ONCE DAILY   polyethylene glycol powder (GLYCOLAX/MIRALAX) 17 GM/SCOOP powder Take 17 g by mouth daily as needed for moderate constipation.   propranolol (INDERAL) 80 MG tablet TAKE ONE TABLET BY MOUTH TWICE DAILY @ 9AM-5PM   ramelteon (ROZEREM) 8 MG tablet Take 1 tablet (8 mg total) by mouth at bedtime.  rivastigmine (EXELON) 4.5 MG capsule TAKE ONE CAPSULE BY MOUTH TWICE DAILY @ 9AM & 5PM   Semaglutide,0.25 or 0.5MG/DOS, 2 MG/3ML SOPN Inject 0.25 mg into the skin once a week for 30 days, THEN 0.5 mg once a week.   telmisartan (MICARDIS) 80 MG tablet TAKE ONE TABLET BY MOUTH DAILY AT 9AM (ORIG)   No facility-administered encounter medications on file as of 05/25/2022.    Allergies as of 05/25/2022 - Review Complete 05/25/2022  Allergen Reaction Noted   Sertraline hcl Other (See Comments) 04/11/2011   Ace inhibitors Cough 07/30/2012   Codeine Palpitations    Darifenacin hydrobromide er Other  (See Comments) 05/14/2011   Gabapentin Other (See Comments) 10/23/2011   Pravastatin Other (See Comments) 01/06/2014   Propoxyphene hcl Palpitations    Seroquel [quetiapine] Other (See Comments) 10/24/2021   Wellbutrin [bupropion hcl] Other (See Comments) 01/10/2011    Past Medical History:  Diagnosis Date   Allergic rhinitis 08/31/2009   Arthralgia of hip 11/14/2017   Attention deficit hyperactivity disorder 06/06/2020   diagnosed as adult w/o formal testing   Bilateral leg edema 06/12/2018   Blepharitis 11/07/2019   Blood dyscrasia    platelets low in past   Cataract    Cervical radiculopathy 09/07/2020   Cervicalgia 05/30/2016   Chronic low back pain 07/18/2016   Degeneration of lumbar intervertebral disc 01/09/2018   Dyspnea on exertion 05/11/2019   Endometriosis    Esophageal stricture    Essential hypertension 05/06/2009   Excessive daytime sleepiness 05/11/2019   External hemorrhoids 08/28/2010   Fatigue 11/14/2017   Generalized anxiety disorder 10/08/2017   GERD (gastroesophageal reflux disease)    Glaucoma 06/06/2020   Gout 12/11/2013   Hiatal hernia    Hypertensive retinopathy of both eyes 10/06/2019   IBS (irritable bowel syndrome) with chronic constipation 11/14/2010   Iridocyclitis 10/06/2019   Laryngopharyngeal reflux (LPR) 07/03/2018   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  No significant change from 2017  dexa 10/2015: t score  Spine -1.3, dual femur -0.9  Took alendronate 2014-2019   Post traumatic stress disorder (PTSD)     Primary osteoarthritis involving multiple joints 03/10/2019   Bilateral hips, knees   Pseudophakia of both eyes 10/06/2019   Pure hypercholesterolemia 03/16/2010   Renal cell carcinoma    R cryoablation by IR 02/16/11, Dr. Kathlene Cote  Followed by Dr. Janee Morn  No evidence of residual disease on CT 12/13 and 12/14   Rib pain on left side 01/03/2021   S/P knee surgery 10/30/2016   Spondylolisthesis of lumbar region 01/09/2018   Syncope and collapse 05/16/2009   since childhood; associated with extreme heat   TB lung, latent 11/25/2019   Thrombocytopenia 05/30/2015   Saw hem 02/2017 - mild, chronic - advised to stop protonix, no concerning cause, plan for pcp to monitor   Tinnitus of right ear 07/03/2018   Type 2 diabetes mellitus without complication, without long-term current use of insulin 10/06/2019   Visual hallucinations    Vulvar itching 01/03/2021    Past Surgical History:  Procedure Laterality  Date   BREAST CYST EXCISION Bilateral over 10 years ago   No visable scar    CATARACT EXTRACTION, BILATERAL Bilateral 2020   COLONOSCOPY     fallopian tube removed     FUNCTIONAL ENDOSCOPIC SINUS SURGERY     HEMORRHOID BANDING     LASER ABLATION OF THE CERVIX     NASAL SINUS SURGERY     RENAL CRYOABLATION  02/16/11   R kidney due to [place] (IR procedure)   TOTAL KNEE ARTHROPLASTY Right 10/30/2016   Procedure: RIGHT TOTAL KNEE ARTHROPLASTY;  Surgeon: Meredith Pel, MD;  Location: [facility];  Service: Orthopedics;  Laterality: Right;   TUBAL LIGATION     UMBILICAL HERNIA REPAIR      Family History  Problem Relation Age of Onset   Diabetes Mother    Hypertension Mother    Hyperlipidemia Mother    Heart disease Mother    Stroke Mother    Kidney disease Mother    Thyroid disease Mother    Sleep apnea Mother    Obesity Mother    Memory loss Mother    Kidney disease Father    Prostate cancer Father    Alcoholism Father    Alcohol abuse Father    Multiple sclerosis Sister     Colitis Brother    Alcohol abuse Brother    Leukemia Brother    Alcohol abuse Daughter    Breast cancer Maternal Aunt    Breast cancer Cousin    Cancer Other    Asthma Other    Colon polyps Other    Colon cancer Neg Hx    Esophageal cancer Neg Hx    Pancreatic cancer Neg Hx    Rectal cancer Neg Hx    Stomach cancer Neg Hx     Social History   Socioeconomic History   Marital status: Legally Separated    Spouse name: Not on file   Number of children: 4   Years of education: 12   Highest education level: High school graduate  Occupational History   Occupation: Disabled  Tobacco Use   Smoking status: Never    Passive exposure: Past   Smokeless tobacco: Never  Vaping Use   Vaping Use: Never used  Substance and Sexual Activity   Alcohol use: No    Comment: rare   Drug use: No   Sexual activity: Never  Other Topics Concern   Not on file  Social History Narrative   Right handed   Lives in a two story apartment building    Social Determinants of Health   Financial Resource Strain: [place]  (03/30/2022)   Overall Financial Resource Strain (CARDIA)    Difficulty of Paying Living Expenses: Not hard at all  Food Insecurity: No Food Insecurity (03/30/2022)   Hunger Vital Sign    Worried About Running Out of Food in the Last Year: Never true    Ran Out of Food in the Last Year: Never true  Transportation Needs: No Transportation Needs (03/30/2022)   PRAPARE - Hydrologist (Medical): No    Lack of Transportation (Non-Medical): No  Physical Activity: Inactive (03/30/2022)   Exercise Vital Sign    Days of Exercise per Week: 0 days    Minutes of Exercise per Session: 0 min  Stress: No Stress Concern Present (03/30/2022)   [place]    Feeling of Stress : Not at all  Social Connections: Moderately  Isolated (03/30/2022)   Social Connection and Isolation Panel [NHANES]    Frequency of  Communication with Friends and Family: More than three times a week    Frequency of Social Gatherings with Friends and Family: More than three times a week    Attends Religious Services: More than 4 times per year    Active Member of [employer] or Organizations: No    Attends Archivist Meetings: Never    Marital Status: Separated  Intimate Partner Violence: Not At Risk (03/30/2022)   Humiliation, Afraid, Rape, and Kick questionnaire    Fear of Current or Ex-Partner: No    Emotionally Abused: No    Physically Abused: No    Sexually Abused: No    Review of Systems  Psychiatric/Behavioral:  Positive for sleep disturbance.     Vitals:   05/25/22 1125  BP: 122/70  Pulse: 60  Temp: 98 F (36.7 C)  SpO2: 99%     Physical Exam Constitutional:      Appearance: She is obese.  HENT:     Head: Normocephalic and atraumatic.     Mouth/Throat:     Mouth: Mucous membranes are moist.  Eyes:     Pupils: Pupils are equal, round, and reactive to light.  Cardiovascular:     Rate and Rhythm: Normal rate and regular rhythm.     Heart sounds: No murmur heard.    No friction rub.  Pulmonary:     Effort: No respiratory distress.     Breath sounds: No stridor. No wheezing or rhonchi.  Musculoskeletal:     Cervical back: No rigidity or tenderness.  Neurological:     Mental Status: She is alert.  Psychiatric:        Mood and Affect: Mood normal.       05/25/2022   11:00 AM  Results of the Epworth flowsheet  Sitting and reading 0  Watching TV 2  Sitting, inactive in a public place (e.g. a theatre or a meeting) 0  As a passenger in a car for an hour without a break 0  Lying down to rest in the afternoon when circumstances permit 0  Sitting and talking to someone 0  Sitting quietly after a lunch without alcohol 0  In a car, while stopped for a few minutes in traffic 0  Total score 2    Data Reviewed: PFT 2020 was within normal limits  Assessment:  Shortness of breath with  activity  Nonrestorative sleep  Deconditioning  Does have a history of anxiety  Plan/Recommendations: Schedule patient for an in lab polysomnogram  Encouraged to perform graded exercise as tolerated  Prescription for ramelteon for sleep onset insomnia  Follow-up in about 3 months  Encouraged to call with significant concerns   Sherrilyn Rist MD [facility] 05/25/2022, 12:24 PM  CC: Binnie Rail, MD

## 2022-05-25 NOTE — Patient Instructions (Signed)
Schedule for in lab sleep study  Graded exercise as tolerated  Plan to spend only 6 to 8 hours in bed  Try and get yourself more active as soon as you get out of bed in the morning  Prescription for ramelteon will be sent to pharmacy  Call with significant concerns

## 2022-05-29 ENCOUNTER — Telehealth: Payer: Self-pay

## 2022-05-29 NOTE — Telephone Encounter (Signed)
PA for Ramelteon has been APPROVED from 05/29/2022 until further notice through [facility]

## 2022-05-29 NOTE — Telephone Encounter (Signed)
PA request receive via CMM for Ramelteon '8MG'$  tablets through [facility].  PA has been submitted and is awaiting determination.   Key: [id number] - PA Case ID: [id number]

## 2022-05-31 ENCOUNTER — Encounter: Payer: Self-pay | Admitting: Internal Medicine

## 2022-05-31 NOTE — Progress Notes (Signed)
Outside notes received. Information abstracted. Notes sent to scan.  

## 2022-06-23 ENCOUNTER — Other Ambulatory Visit: Payer: Self-pay | Admitting: Internal Medicine

## 2022-07-02 ENCOUNTER — Ambulatory Visit (HOSPITAL_BASED_OUTPATIENT_CLINIC_OR_DEPARTMENT_OTHER): Payer: Medicare HMO | Admitting: Student in an Organized Health Care Education/Training Program

## 2022-07-02 ENCOUNTER — Encounter (HOSPITAL_COMMUNITY): Payer: Self-pay | Admitting: Student in an Organized Health Care Education/Training Program

## 2022-07-02 VITALS — BP 138/83 | HR 77 | Wt 221.0 lb

## 2022-07-02 DIAGNOSIS — F02B2 Dementia in other diseases classified elsewhere, moderate, with psychotic disturbance: Secondary | ICD-10-CM

## 2022-07-02 DIAGNOSIS — G3183 Dementia with Lewy bodies: Secondary | ICD-10-CM
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
## 2022-07-02 NOTE — Patient Instructions (Signed)
Medication recommendations  Please do not start antipsychotic medications: do not take Zyprexa or Seroquel

## 2022-07-04 ENCOUNTER — Telehealth: Payer: Self-pay | Admitting: Neurology

## 2022-07-04 NOTE — Telephone Encounter (Signed)
Telephone call to patient, Do you give me permission to talk to the daughter. Per Patient okay to speak to patient.    Advised Patient daughter, I have reached out to the patient on several occasions to go over everything with her and the daughter. Per Dr.Jaffe, DatScan is denied please call patient to discuss the Skin Biopsy instead.  Per Daughter the Psychiatrist says patient do have Lewy Body Dementia and she is getting worse.

## 2022-07-04 NOTE — Telephone Encounter (Signed)
[facility] insurance verification form filled out and faxed over.   Patient advised to schedule a visit with DR.Jaffe to discuss next if we get denial back for the Biopsy.    Front desk please schedule a visit in the next two weeks.

## 2022-07-04 NOTE — Addendum Note (Signed)
Addended by: Charlette Caffey on: 07/04/2022 01:44 PM   Modules accepted: Level of Service

## 2022-07-04 NOTE — Telephone Encounter (Signed)
Patients daughter said clay went to the doctor and they feel her condition has got worse and she needs to see jaffe before her August appt. Daughter would like a call back to discuss

## 2022-07-05 ENCOUNTER — Other Ambulatory Visit: Payer: Self-pay | Admitting: Internal Medicine

## 2022-07-10 ENCOUNTER — Telehealth: Payer: Self-pay | Admitting: Internal Medicine

## 2022-07-10 NOTE — Telephone Encounter (Signed)
Forms received and placed in folder to be signed and completed.

## 2022-07-10 NOTE — Telephone Encounter (Signed)
Patient's daughter, Lenna Sciara (on [place]), dropped off paperwork to be completed for patient. Please call daughter when paperwork is ready to be picked up at [phone].   Paperwork has been placed in Dr. Quay Burow box up front.

## 2022-07-11 NOTE — Telephone Encounter (Signed)
Spoke with daughter today and paperwork complete.  Form left up front for pick up.

## 2022-07-12 ENCOUNTER — Other Ambulatory Visit: Payer: Self-pay

## 2022-07-12 DIAGNOSIS — E119 Type 2 diabetes mellitus without complications: Secondary | ICD-10-CM

## 2022-07-12 MED ORDER — SEMAGLUTIDE(0.25 OR 0.5MG/DOS) 2 MG/1.5ML ~~LOC~~ SOPN: 0.5000 mg | PEN_INJECTOR | SUBCUTANEOUS | 0 refills | Status: DC

## 2022-07-17 NOTE — Progress Notes (Addendum)
NEUROLOGY FOLLOW UP OFFICE NOTE  Janet Le [id number]  Assessment/Plan:   Lewy body dementia    Increase rivastigmine to 6mg  twice daily She and her family will try non-pharmacologic modalities to address hallucinations, paranoia and agitation.  She does better around family and they will try to be around her more often and are discussing that she moves in with one of her daughters.  If absolutely necessary, we can restart olanzapine.  We discussed the black box warning and while antipsychotic medications are not recommended for psychosis in setting of dementia, we do use them in clinical practice as medication options are limited.   Advised to continue daily activities but should have somebody monitor. Continue to refrain from driving Will try to get skin biopsy to evaluate presence of P-SYN to aid in diagnosis.   Follow up with me in 6 months     Total time spent face to face with patient and daughter discussing diagnosis and management and reviewing chart:  60 minutes.   Subjective:  Janet Le is a 67 year old right-handed woman with hypertension, pre-diabetes, TB, IBS, lymphedema, osteoarthritis and osteoporosis and history of renal cell carcinoma who follows up for Lewy body dementia.  She is accompanied by her daughter (and another daughter via phone) who supplement histor.    UPDATE: Current medications:  rivastigmine 4.5mg  BID, olanzapine 5mg  QHS, duloxetine 60mg , ramelteon 8mg  QHS, propranolol 80mg  BID, amlodipine, Lasix   Quetiapine initially to help sleep 50mg  BID.  Endorsed increased hallucinations and screaming in sleep.  Changed to olanzapine 10mg  QHS. She still endorsed side effects, so she was referred to psychiatry who told her to discontinue antipsychotic medications as they are not indicated in dementia.  Visual hallucinations such as shadows are improved.  But she is paranoid.  She is scared of her neighbors.  If she comes home, she may see drawings on the  wall that are not there, which she believes are drawn by her neighbors.  She thinks her daughter may put her in harm's way. She sometimes may hear noise not there, such as tapping.  Since starting olanzapine, sleep is somewhat improved but not significantly improved.  She talks in her sleep and wakes up 2 to 3 times a night.  She will then walk around the house.  She has had some near falls.  She feels like she is leaning more towards the left when she is up and ambulating.  She ambulates with a cane.  She lives in her own place but her grandson usually stays with her and her daughter sees her frequently.  She is having her medications set up in a pack from the pharmacy.  Insurance wouldn't approve DaT scan  HISTORY: I.  COGNITIVE DEFICITS/VISUAL DISTURBANCE/TREMORS: In 2014, she started experiencing visual disturbance.  She describes initially seeing a line of light along the bottom of her visual field in the left eye.  She then developed what looks like bubbles floating up in the temporal aspect of the visual fields of in both eyes.  It lasts only a few seconds and occurs several times a day.  She also began experiencing dizziness and lightheadedness and was found to have orthostatic hypotension.  In 2018, she began noticing memory problems.  Labs from May 2019 included normal B12 528 and TSH 1.37.  She has been treated for latent TB.   Around this time, she began experiencing tremors in the hands and occasional jerks of her extremities.   She underwent neuropsychological testing  07/09/18.  She demonstrated probable deficits of processing speed, executive function, visual memory significantly more than verbal memory, and Theatre stage manager.  It was noted that such a profile may be seen in early Parkinson's disease or a Parkinson's plus syndrome.  However, her performance may have been skewed due to observed anxiety and mildly suboptimal task persistence.  In March 2022, she was confused and hallucinating.   She was looking out of the house at her car and looked like there was somebody in her car.  When she went out to her car, nobody was there.  She started seeing people, strangers, not there in her house. She said that they were coming in through the walls.  She may quickly think she sees something (like a figure) and when she turns her head to that direction, it is gone.    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  She underwent repeat neuropsychological evaluation on 01/25/2021.  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  Given her history of gait instability, hallucinations, and tremor, Lewy body dementia suspected. MRI of brain on 12/19/2013 showed small 11 mm left frontal dural calcification vs meningioma at the left vertex.  MRI of brain on 10/05/2020 which showed stable 11 mm left frontal convexity lesion (probable meningioma), minimal chronic small vessel disease   II.  NEUROPATHY:  In 2016, she started experiencing burning pain from the bottom of her feet up the back of her legs to the knees.  It is intermittent and lasts for various and unknown period of time, but not aware of any triggers such as due to position.  In addition, she is reporting increased back pain and spasms, worse when sitting and improved when laying supine.  She has a history of chronic low back pain with left lumbar radiculopathy, as well as right knee pain.  She has been seen and evaluated by orthopedics and pain management.  MRI of lumbar spine from 07/27/16 revealed advanced L4-5 and L5-S1 facet arthrosis with some bilateral neural foraminal stenosis.  Dr. August Saucer of orthopedics did not think surgery was an option at that time.  TSH from 10/15/16 was 1.67.  B12 from 10/18/16 was 537.  Hgb A1c from 04/05/17 was 5.6.  NCV-EMG from 10/25/16 demonstrated no evidence of large fiber sensorimotor polyneuropathy or lumbosacral  radiculopathy.  MRI of brain and cervical spine on 10/05/2020 which showed stable 11 mm left frontal convexity meningioma, minimal chronic small vessel disease, moderate cervical spondylosis from C4 through C7 and small central disc protrusion at C7-T1 but no acute intracranial abnormality or spinal stenosis or cord abnormality.   Labs from 11/02/2020 include negative ANA, sed rate 16, ACE 23, SPEP/IFE negative for M-spike.  NCV-EMG on 11/22/2020 now demonstrated chronic sensorimotor axonal polyneuropathy.  PAST MEDICAL HISTORY: Past Medical History:  Diagnosis Date   Allergic rhinitis 08/31/2009   Arthralgia of hip 11/14/2017   Attention deficit hyperactivity disorder 06/06/2020   diagnosed as adult w/o formal testing   Bilateral leg edema 06/12/2018   Blepharitis 11/07/2019   Blood dyscrasia    platelets low in past   Cataract    Cervical radiculopathy 09/07/2020   Cervicalgia 05/30/2016   Chronic low back pain 07/18/2016   Degeneration of lumbar intervertebral disc 01/09/2018   Dyspnea on exertion 05/11/2019   Endometriosis    Esophageal stricture    Essential hypertension 05/06/2009   Excessive daytime sleepiness 05/11/2019  External hemorrhoids 08/28/2010   Fatigue 11/14/2017   Generalized anxiety disorder 10/08/2017   GERD (gastroesophageal reflux disease)    Glaucoma 06/06/2020   Gout 12/11/2013   Hiatal hernia    Hypertensive retinopathy of both eyes 10/06/2019   IBS (irritable bowel syndrome) with chronic constipation 11/14/2010   Iridocyclitis 10/06/2019   Laryngopharyngeal reflux (LPR) 07/03/2018   Lattice degeneration of both retinas 10/06/2019   Left lumbar radiculopathy 04/13/2012   LVH (left ventricular hypertrophy), mild 05/18/2017   Echo 04/2017 - mild LVH, normal EF, Grade 1 DD   Lymphedema    Major depressive disorder 10/08/2017   Migraine 06/14/2009   Mild neurocognitive disorder with Lewy bodies 01/25/2021   Neuropathy 10/07/2017   Nocturnal hypoxemia  05/11/2019   Osteoarthritis    Osteopenia 11/18/2015   06/10/2018: LFN -1.1, left radius 0.7, low FRAX.  No significant change from 2017  dexa 10/2015: t score  Spine -1.3, dual femur -0.9  Took alendronate 2014-2019   Post traumatic stress disorder (PTSD)    Primary osteoarthritis involving multiple joints 03/10/2019   Bilateral hips, knees   Pseudophakia of both eyes 10/06/2019   Pure hypercholesterolemia 03/16/2010   Renal cell carcinoma    R cryoablation by IR 02/16/11, Dr. Fredia Sorrow  Followed by Dr. Claire Shown  No evidence of residual disease on CT 12/13 and 12/14   Rib pain on left side 01/03/2021   S/P knee surgery 10/30/2016   Spondylolisthesis of lumbar region 01/09/2018   Syncope and collapse 05/16/2009   since childhood; associated with extreme heat   TB lung, latent 11/25/2019   Thrombocytopenia 05/30/2015   Saw hem 02/2017 - mild, chronic - advised to stop protonix, no concerning cause, plan for pcp to monitor   Tinnitus of right ear 07/03/2018   Type 2 diabetes mellitus without complication, without long-term current use of insulin 10/06/2019   Visual hallucinations    Vulvar itching 01/03/2021    MEDICATIONS: Current Outpatient Medications on File Prior to Visit  Medication Sig Dispense Refill   Accu-Chek FastClix Lancets MISC USE AS DIRECTED UP  TO  1 TIME  DAILY- DX CODE R73.03 100 each 1   acetaminophen (TYLENOL) 325 MG tablet Take 1,300 mg by mouth at bedtime.     amLODipine (NORVASC) 5 MG tablet TAKE ONE TABLET BY MOUTH ONCE DAILY 90 tablet 0   atorvastatin (LIPITOR) 10 MG tablet Take 1 tablet (10 mg total) by mouth daily. 90 tablet 3   Blood Glucose Monitoring Suppl (ACCU-CHEK GUIDE ME) w/Device KIT Use to check blood sugar daily 1 kit 0   calcium carbonate (OS-CAL) 600 MG TABS tablet Take 600 mg by mouth 2 (two) times daily with a meal.     clotrimazole (LOTRIMIN) 1 % cream Apply topically under breasts daily PRN 30 g 1   Continuous Blood Gluc Receiver (FREESTYLE  LIBRE 14 DAY READER) DEVI UAD to check sugars.  E11.9 1 each 0   Continuous Blood Gluc Sensor (FREESTYLE LIBRE 14 DAY SENSOR) MISC UAD to check sugars.  E11.9 2 each 5   dicyclomine (BENTYL) 10 MG capsule TAKE 1 TO 2 CAPSULES BY MOUTH TWICE DAILY (VIAL) 360 capsule 11   DULoxetine (CYMBALTA) 60 MG capsule TAKE ONE CAPSULE BY MOUTH DAILY AT 9AM 90 capsule 1   Elastic Bandages & Supports (MEDICAL COMPRESSION SOCKS) MISC Compression sock, medium compression 20-30 mmHg. Use daily for venous insufficiency I87.2 3 each 0   fluticasone (FLONASE) 50 MCG/ACT nasal spray INSTILL 2 SPRAYS IN EACH NOSTRIL  DAILY (BULK) 16 g 11   folic acid (FOLVITE) 1 MG tablet TAKE ONE TABLET BY MOUTH DAILY AT 9AM 240 tablet 11   furosemide (LASIX) 20 MG tablet TAKE ONE TABLET BY MOUTH DAILY AT 9AM 60 tablet 11   glucose blood (ACCU-CHEK GUIDE) test strip Use to check sugars once daily. Dx Code-R73.03 100 each 1   hydrocortisone-pramoxine (ANALPRAM HC SINGLES) 2.5-1 % rectal cream Place 1 Application rectally 3 (three) times daily. 30 g 1   Incontinence Supply Disposable (COMFORT PROTECT ADULT DIAPER/L) MISC Use as directed for urinary incontinence 90 each 5   LINZESS 145 MCG CAPS capsule TAKE ONE CAPSULE BY MOUTH DAILY BEFORE BREAKFAST **PLEASE CALL OFFICE TO SCHEDULE FOLLOW UP (ORIG) 90 capsule 11   methotrexate 2.5 MG tablet Take 10 mg by mouth once a week. Mondays     OLANZapine (ZYPREXA) 5 MG tablet Take 1 tablet (5 mg total) by mouth at bedtime. 30 tablet 0   Olopatadine HCl 0.2 % SOLN INSTILL 1 DROP INTO EACH EYE ONCE DAILY     OZEMPIC, 0.25 OR 0.5 MG/DOSE, 2 MG/3ML SOPN INJECT 0.25 MG  SUBCUTANEOUSLY ONCE A WEEK FOR 30 DAYS THEN 0.5 MG ONCE A WEEK 3 mL 0   polyethylene glycol powder (GLYCOLAX/MIRALAX) 17 GM/SCOOP powder Take 17 g by mouth daily as needed for moderate constipation. 255 g 11   propranolol (INDERAL) 80 MG tablet TAKE ONE TABLET BY MOUTH TWICE DAILY @ 9AM-5PM 120 tablet 5   ramelteon (ROZEREM) 8 MG tablet  Take 1 tablet (8 mg total) by mouth at bedtime. 30 tablet 3   rivastigmine (EXELON) 4.5 MG capsule TAKE ONE CAPSULE BY MOUTH TWICE DAILY @ 9AM & 5PM 60 capsule 2   Semaglutide,0.25 or 0.5MG /DOS, 2 MG/1.5ML SOPN Inject 0.5 mg into the skin once a week. 1.5 mL 0   Semaglutide,0.25 or 0.5MG /DOS, 2 MG/1.5ML SOPN Inject 0.5 mg into the skin once a week. 3 mL 0   telmisartan (MICARDIS) 80 MG tablet TAKE ONE TABLET BY MOUTH DAILY AT 9AM (ORIG) 60 tablet 5   No current facility-administered medications on file prior to visit.    ALLERGIES: Allergies  Allergen Reactions   Sertraline Hcl Other (See Comments)    Spasms; numbness   Ace Inhibitors Cough   Codeine Palpitations   Darifenacin Hydrobromide Er Other (See Comments)    Pt states made her feel queezy & drunk   Gabapentin Other (See Comments)    Hallucinations   Pravastatin Other (See Comments)    myalgias   Propoxyphene Hcl Palpitations   Seroquel [Quetiapine] Other (See Comments)    Woke up screaming and yelling after taking it   Wellbutrin [Bupropion Hcl] Other (See Comments)    Numbness of mouth/lips    FAMILY HISTORY: Family History  Problem Relation Age of Onset   Diabetes Mother    Hypertension Mother    Hyperlipidemia Mother    Heart disease Mother    Stroke Mother    Kidney disease Mother    Thyroid disease Mother    Sleep apnea Mother    Obesity Mother    Memory loss Mother    Kidney disease Father    Prostate cancer Father    Alcoholism Father    Alcohol abuse Father    Multiple sclerosis Sister    Colitis Brother    Alcohol abuse Brother    Leukemia Brother    Alcohol abuse Daughter    Breast cancer Maternal Aunt    Breast cancer  Cousin    Cancer Other    Asthma Other    Colon polyps Other    Colon cancer Neg Hx    Esophageal cancer Neg Hx    Pancreatic cancer Neg Hx    Rectal cancer Neg Hx    Stomach cancer Neg Hx       Objective:  Blood pressure 106/72, pulse 74, height 5\' 1"  (1.549 m), weight  222 lb (100.7 kg), SpO2 97 %. General: No acute distress.  Patient appears well-groomed.   Head:  Normocephalic/atraumatic Eyes:  Fundi examined but not visualized Neck: supple, no paraspinal tenderness, full range of motion Heart:  Regular rate and rhythm Lungs:  Clear to auscultation bilaterally Back: No paraspinal tenderness Neurological Exam: alert and oriented to person, place, and time.  Speech fluent and not dysarthric, language intact.      09/07/2021   12:00 PM 07/18/2022    2:00 PM  [employer] Mental Exam  Weekday Correct 0 0  Current year 1 1  What state are we in? 1 1  Amount spent 1 0  Amount left 0 0  # of Animals 3 1  5  objects recall 1 1  Number series 1 1  Hour markers 0 0  Time correct 0 0  Placed X in triangle correctly 1 1  Largest Figure 1 1  Name of female 2 0  Date back to work 0 0  Type of work 0 2  State she lived in 0 0  Total score 12 9   CN II-XII intact. Bulk and tone normal, no rigidity, no bradykinesia, no tremor, muscle strength 5/5 throughout.  Sensation to light touch intact.  Deep tendon reflexes 1+ throughout.  Finger to nose testing intact.  Mildly broad-based gait.  Romberg negative.   Shon Millet, DO  CC: Cheryll Cockayne, MD

## 2022-07-18 ENCOUNTER — Ambulatory Visit: Payer: 59 | Admitting: Neurology

## 2022-07-18 ENCOUNTER — Encounter: Payer: Self-pay | Admitting: Neurology

## 2022-07-18 ENCOUNTER — Ambulatory Visit (INDEPENDENT_AMBULATORY_CARE_PROVIDER_SITE_OTHER): Payer: Medicare HMO | Admitting: Neurology

## 2022-07-18 VITALS — BP 106/72 | HR 74 | Ht 61.0 in | Wt 222.0 lb

## 2022-07-18 DIAGNOSIS — G3183 Dementia with Lewy bodies: Secondary | ICD-10-CM | POA: Diagnosis not present

## 2022-07-18 DIAGNOSIS — F02B2 Dementia in other diseases classified elsewhere, moderate, with psychotic disturbance: Secondary | ICD-10-CM

## 2022-07-18 MED ORDER — RIVASTIGMINE TARTRATE 6 MG PO CAPS: 6.0000 mg | ORAL_CAPSULE | Freq: Two times a day (BID) | ORAL | 5 refills | Status: DC

## 2022-07-18 NOTE — Patient Instructions (Addendum)
Increase rivastigmine to '6mg'$  twice daily Will follow up about getting skin biopsy approved Try to maintain socialization and being around family more often to suppress hallucinations Monitor medications No driving Look into speaking with eldercare law practice to set up POA Follow up in 6 months.   RESOURCES: Development worker, community of [facility]:  [phone]  Tel [place]:   [phone]  [email or web address]-resources-[email or web address]/resources.cfm    Resources for common questions found under "Pathways & Protocols "  www.senior-resources-[place].org/pathways/Pathways_Menu.htm   Resources for [facility] facilities:  www.ncnursinghomeguide.com   For assistance with senior care, elder law, and estate planning (POA, medical directives):  [employer]  [address], [place]  [address]  Tel: [phone]  [email or web address]   Berneice Heinrich  Tel: [phone]  [email or web address]     Lewy Body Dementia Lewy body dementia, also called dementia with Lewy bodies, is a condition that affects the way the brain functions. It is one form of dementia. In this condition, proteins called Lewy bodies build up in certain areas of the brain. This causes problems with: Memory. Decision making. Behavior. Speaking. Thinking and problem solving. Movements and balance. This condition is progressive, which means that it gets worse with time (is degenerative) and cannot be reversed. What are the causes? This condition is caused by the buildup of Lewy bodies in brain cells in areas of the brain that control memory, thinking, and movement. It is not known what causes the Lewy bodies to build up. What increases the risk? You are more likely to develop this condition if you: Have a family history of Lewy body dementia or Parkinson's disease. Are 33 years old or older. Are female. What are the signs or symptoms? Symptoms of this condition may  include: Seeing things that are not there (hallucinating). Sleep problems, such as acting out dreams while you are asleep. Changes in memory, attention, and concentration that come and go (fluctuate). Symptoms of dementia, such as: Trouble with memory. Trouble paying attention. Problems with planning and organizing. Problems with judgment. Behavioral problems. Symptoms of Parkinson's disease, such as: Shaking movements that you cannot control (tremor). Tremors usually start in a hand or foot when you are resting (resting tremor). Stooped posture. Slowing of movement. Stiff muscles (rigidity). Loss of balance and stability when standing. How is this diagnosed? This condition is diagnosed by a specialist who diagnoses and treats this condition (neurologist). Your health care provider will talk with you and your family, friends, or caregivers about your history and symptoms. A thorough medical history will be taken, and you will have a physical exam and tests. Tests may include: Lab tests, such as blood or urine tests. Imaging tests, such as a CT scan, a PET scan, or an MRI. A test that involves removing and testing a small amount of the fluid that surrounds the brain and spinal cord (lumbar puncture). Tests that evaluate brain function, such as memory tests, cognitive tests, and neuropsychological tests. How is this treated? There is no cure for this condition. Treatment focuses on managing your symptoms. Treatment may include: Medicines to help with hallucinations, sleep, and behavioral problems. Speech, occupational, and physical therapy. Your health care provider can help direct you to support groups, organizations, and other health care providers who can help with decisions about your care. Follow these instructions at home: Medicines Take over-the-counter and prescription medicines only as told by your health care provider. To help you manage your medicines, use  a pill organizer or  pill reminder. Avoid taking medicines that can affect thinking, such as pain medicines or certain sleeping medicines. Always check with your health care provider before taking any new medicines. Lifestyle Make healthy lifestyle choices: Be physically active as told by your health care provider. Do not use any products that contain nicotine or tobacco, such as cigarettes, e-cigarettes, and chewing tobacco. If you need help quitting, ask your health care provider. Try to practice stress-management techniques when you experience stress, such as mindfulness, yoga, or deep breathing. Stay socially connected. Talk regularly with other people, such as family, friends, and neighbors. Make sure you sleep well. These tips can help you get a good night's rest: Avoid napping during the day. Keep your sleeping area dark and cool. Avoid exercising a few hours before you go to bed. Avoid caffeine products in the evening. Eating and drinking Do not drink alcohol. Drink enough fluid to keep your urine pale yellow. Eat a healthy diet. Safety  Work with your health care provider to determine what you need help with and what your safety needs are. If you have trouble moving around, use a cane or walker as told by your health care provider. Make sure your home environment is safe. To do this: Remove things that can be a tripping hazard, such as throw rugs or clutter. Install grab bars and railings in your home to prevent falls. Talk with your health care provider about whether it is safe for you to drive. If you were given a bracelet that identifies you as a person with memory loss or tracks your location, make sure to wear it at all times. General instructions  Work with your family to make important decisions, such as advance directives, medical power of attorney, or a living will. Keep all follow-up visits. This is important. Where to find more information [employer] of Neurological Disorders  and Stroke: MasterBoxes.it Contact a health care provider if you have: New or worsening confusion. New or worsening trouble with sleeping or increased daytime sleepiness. Problems with choking or swallowing. Get help right away if: You feel depressed or sad, or feel that you want to harm yourself. Your family members become concerned for your safety. If you ever feel like you may hurt yourself or others, or have thoughts about taking your own life, get help right away. Go to your nearest emergency department or: Call your local emergency services (911 in the [place] Call a suicide crisis helpline, such as the [place] at [phone] or 988 in the [place]. This is open 24 hours a day in the [place] Text the Crisis Text Line at 954 842 0797 (in the [place].). Summary Lewy body dementia is a condition that affects the way the brain functions. It causes problems with functions such as memory and thinking. Lewy body dementia gets worse with time. This condition is caused by the buildup of proteins called Lewy bodies in brain cells. It is not known what causes the Lewy bodies to build up. Work with your health care provider to determine what you need help with and what your safety needs are. Your health care provider can help direct you to support groups, organizations, and other health care providers who can help with decisions about your care. This information is not intended to replace advice given to you by your health care provider. Make sure you discuss any questions you have with your health care provider. Document Revised: 01/04/2021 Document Reviewed: 10/26/2019 Elsevier Patient Education  [place].

## 2022-07-31 ENCOUNTER — Other Ambulatory Visit: Payer: Self-pay | Admitting: Neurology

## 2022-08-03 ENCOUNTER — Other Ambulatory Visit: Payer: Self-pay | Admitting: Internal Medicine

## 2022-08-03 ENCOUNTER — Other Ambulatory Visit: Payer: Self-pay

## 2022-08-03 DIAGNOSIS — M542 Cervicalgia: Secondary | ICD-10-CM

## 2022-08-21 ENCOUNTER — Telehealth: Payer: Self-pay

## 2022-08-21 NOTE — Telephone Encounter (Signed)
PA request received via CMM for Ramelteon '8MG'$  tablets  PA has been submitted to [facility] and has been DENIED due to:   The preferred drug(s), you may not have tried, are: Belsomra tablet trazodone tablet Your provider needs to give [place] medical reasons why the preferred drug(s) would not work for you and/or would have bad side effects.  Key: Rosemarie Ax

## 2022-08-21 NOTE — Telephone Encounter (Signed)
Routing to Dr. Jenetta Downer for review.

## 2022-08-23 ENCOUNTER — Other Ambulatory Visit: Payer: Self-pay | Admitting: Neurology

## 2022-09-17 ENCOUNTER — Telehealth: Payer: Self-pay | Admitting: Internal Medicine

## 2022-09-17 NOTE — Telephone Encounter (Signed)
Patient dropped off document  Healthcare Provider Verification , to be filled out by provider. Patient requested to send it via Call Patient to pick up within ASAP. Document is located in providers tray at front office.Please advise at  Contact Daughter Lenna Sciara) at [phone].

## 2022-09-18 NOTE — Telephone Encounter (Signed)
Form placed in Dr. Quay Burow folder to review and sign.

## 2022-09-19 NOTE — Telephone Encounter (Signed)
Form completed and patient's daughter made aware.

## 2022-09-24 ENCOUNTER — Ambulatory Visit (HOSPITAL_BASED_OUTPATIENT_CLINIC_OR_DEPARTMENT_OTHER): Payer: Medicare HMO | Attending: Pulmonary Disease | Admitting: Pulmonary Disease

## 2022-09-25 ENCOUNTER — Other Ambulatory Visit: Payer: Self-pay

## 2022-09-25 ENCOUNTER — Other Ambulatory Visit: Payer: Self-pay | Admitting: Internal Medicine

## 2022-09-25 DIAGNOSIS — E119 Type 2 diabetes mellitus without complications: Secondary | ICD-10-CM

## 2022-09-25 MED ORDER — OZEMPIC (0.25 OR 0.5 MG/DOSE) 2 MG/3ML ~~LOC~~ SOPN: PEN_INJECTOR | SUBCUTANEOUS | 0 refills | Status: DC

## 2022-10-01 ENCOUNTER — Other Ambulatory Visit: Payer: Self-pay | Admitting: Internal Medicine

## 2022-10-09 ENCOUNTER — Encounter: Payer: 59 | Admitting: Internal Medicine

## 2022-10-09 ENCOUNTER — Encounter: Payer: Self-pay | Admitting: Internal Medicine

## 2022-10-09 DIAGNOSIS — E782 Mixed hyperlipidemia: Secondary | ICD-10-CM

## 2022-10-09 DIAGNOSIS — E119 Type 2 diabetes mellitus without complications: Secondary | ICD-10-CM

## 2022-10-09 DIAGNOSIS — I10 Essential (primary) hypertension: Secondary | ICD-10-CM

## 2022-10-09 NOTE — Assessment & Plan Note (Signed)
Chronic Following with GI Bentyl 10 mg bid On Linzess and MiraLAX

## 2022-10-09 NOTE — Assessment & Plan Note (Signed)
Chronic Blood pressure well controlled CMP Continue amlodipine 5 mg once daily, propranolol 80 mg twice daily, telmisartan 80 mg nightly 

## 2022-10-09 NOTE — Assessment & Plan Note (Signed)
Chronic Lab Results  Component Value Date   HGBA1C 5.7 04/09/2022   Sugars have been controlled with diet Continue diabetic diet Encouraged regular exercise

## 2022-10-09 NOTE — Assessment & Plan Note (Signed)
Chronic Following with Dr. Everlena Cooper Currently on Cymbalta 60 mg daily-continue Currently taking zyprexa 5 mg HS and exalon 6 mg bid

## 2022-10-09 NOTE — Assessment & Plan Note (Signed)
Chronic Regular exercise and healthy diet encouraged Check lipid panel  Continue atorvastatin 10 mg daily 

## 2022-10-09 NOTE — Assessment & Plan Note (Signed)
Chronic controlled Continue Cymbalta 60 mg daily Also on zyprexa 5 mg nightly

## 2022-10-09 NOTE — Patient Instructions (Addendum)
      Blood work was ordered.   The lab is on the first floor.    Medications changes include :   stop dicyclomine    A referral was ordered for XXX.     Someone will call you to schedule an appointment.    Return in about 6 months (around 04/10/2023) for Physical Exam.

## 2022-10-09 NOTE — Progress Notes (Signed)
Subjective:    Patient ID: Janet Le, female    DOB: [birth year], 67 y.o.   MRN: [id number]     HPI Janet Le is here for follow up of her chronic medical problems.  Stop dicyclomine  On ozempic  Medications and allergies reviewed with patient and updated if appropriate.  Current Outpatient Medications on File Prior to Visit  Medication Sig Dispense Refill  . Accu-Chek FastClix Lancets MISC USE AS DIRECTED UP  TO  1 TIME  DAILY- DX CODE R73.03 100 each 1  . acetaminophen (TYLENOL) 325 MG tablet Take 1,300 mg by mouth at bedtime.    [name] atorvastatin (LIPITOR) 10 MG tablet Take 1 tablet (10 mg total) by mouth daily. 90 tablet 3  . Blood Glucose Monitoring Suppl (ACCU-CHEK GUIDE ME) w/Device KIT Use to check blood sugar daily 1 kit 0  . calcium carbonate (OS-CAL) 600 MG TABS tablet Take 600 mg by mouth 2 (two) times daily with a meal.    . clotrimazole (LOTRIMIN) 1 % cream Apply topically under breasts daily PRN 30 g 1  . dicyclomine (BENTYL) 10 MG capsule TAKE 1 TO 2 CAPSULES BY MOUTH TWICE DAILY (VIAL) 360 capsule 11  . Elastic Bandages & Supports (MEDICAL COMPRESSION SOCKS) MISC Compression sock, medium compression 20-30 mmHg. Use daily for venous insufficiency I87.2 3 each 0  . fluticasone (FLONASE) 50 MCG/ACT nasal spray INSTILL 2 SPRAYS IN EACH NOSTRIL DAILY (BULK) 16 g 11  . glucose blood (ACCU-CHEK GUIDE) test strip Use to check sugars once daily. Dx Code-R73.03 100 each 1  . hydrocortisone-pramoxine (ANALPRAM HC SINGLES) 2.5-1 % rectal cream Place 1 Application rectally 3 (three) times daily. 30 g 1  . Incontinence Supply Disposable (COMFORT PROTECT ADULT DIAPER/L) MISC Use as directed for urinary incontinence 90 each 5  . LINZESS 145 MCG CAPS capsule TAKE ONE CAPSULE BY MOUTH DAILY BEFORE BREAKFAST **PLEASE CALL OFFICE TO SCHEDULE FOLLOW UP (ORIG) 90 capsule 11  . Olopatadine HCl 0.2 % SOLN INSTILL 1 DROP INTO EACH EYE ONCE DAILY    . polyethylene glycol powder  (GLYCOLAX/MIRALAX) 17 GM/SCOOP powder Take 17 g by mouth daily as needed for moderate constipation. 255 g 11  . propranolol (INDERAL) 80 MG tablet TAKE ONE TABLET BY MOUTH TWICE DAILY @9AM -5PM 120 tablet 11  . telmisartan (MICARDIS) 80 MG tablet TAKE ONE TABLET BY MOUTH DAILY AT 9AM (ORIG) 60 tablet 11   No current facility-administered medications on file prior to visit.     Review of Systems     Objective:  There were no vitals filed for this visit. BP Readings from Last 3 Encounters:  02/04/23 112/76  12/21/22 112/64  07/18/22 106/72   Wt Readings from Last 3 Encounters:  02/04/23 220 lb (99.8 kg)  12/21/22 226 lb (102.5 kg)  07/18/22 222 lb (100.7 kg)   There is no height or weight on file to calculate BMI.    Physical Exam     Lab Results  Component Value Date   WBC 7.5 12/21/2022   HGB 13.1 12/21/2022   HCT 40.5 12/21/2022   PLT 138.0 (L) 12/21/2022   GLUCOSE 91 12/21/2022   CHOL 144 12/21/2022   TRIG 104.0 12/21/2022   HDL 51.90 12/21/2022   LDLDIRECT 143.8 07/01/2012   LDLCALC 71 12/21/2022   ALT 17 12/21/2022   AST 19 12/21/2022   NA 142 12/21/2022   K 3.5 12/21/2022   CL 106 12/21/2022   CREATININE 0.92 12/21/2022  BUN 14 12/21/2022   CO2 32 12/21/2022   TSH 2.46 04/21/2021   INR 0.91 02/08/2011   HGBA1C 5.5 12/21/2022   MICROALBUR 0.8 04/09/2022     Assessment & Plan:    See Problem List for Assessment and Plan of chronic medical problems.    This encounter was created in error - please disregard.

## 2022-10-30 ENCOUNTER — Other Ambulatory Visit: Payer: Self-pay | Admitting: Internal Medicine

## 2022-10-31 ENCOUNTER — Other Ambulatory Visit: Payer: Self-pay | Admitting: Internal Medicine

## 2022-12-06 ENCOUNTER — Other Ambulatory Visit (HOSPITAL_COMMUNITY): Payer: Self-pay

## 2022-12-10 ENCOUNTER — Other Ambulatory Visit: Payer: Self-pay | Admitting: Internal Medicine

## 2022-12-14 ENCOUNTER — Ambulatory Visit: Payer: Medicare HMO | Admitting: Internal Medicine

## 2022-12-18 ENCOUNTER — Encounter: Payer: Self-pay | Admitting: Internal Medicine

## 2022-12-18 ENCOUNTER — Ambulatory Visit: Payer: Medicare HMO | Admitting: Internal Medicine

## 2022-12-18 NOTE — Patient Instructions (Addendum)
      Blood work was ordered.   The lab is on the first floor.    Medications changes include :       A referral was ordered and someone will call you to schedule an appointment.     Return in about 6 months (around 06/20/2023) for Physical Exam.

## 2022-12-18 NOTE — Progress Notes (Signed)
Subjective:    Patient ID: Janet Le, female    DOB: 08-09-1955, 67 y.o.   MRN: [id number]     HPI [name] is here for follow up of her chronic medical problems.    Medications and allergies reviewed with patient and updated if appropriate.  Current Outpatient Medications on File Prior to Visit  Medication Sig Dispense Refill  . Accu-Chek FastClix Lancets MISC USE AS DIRECTED UP  TO  1 TIME  DAILY- DX CODE R73.03 100 each 1  . acetaminophen (TYLENOL) 325 MG tablet Take 1,300 mg by mouth at bedtime.    [name] atorvastatin (LIPITOR) 10 MG tablet Take 1 tablet (10 mg total) by mouth daily. 90 tablet 3  . Blood Glucose Monitoring Suppl (ACCU-CHEK GUIDE ME) w/Device KIT Use to check blood sugar daily 1 kit 0  . calcium carbonate (OS-CAL) 600 MG TABS tablet Take 600 mg by mouth 2 (two) times daily with a meal.    . clotrimazole (LOTRIMIN) 1 % cream Apply topically under breasts daily PRN 30 g 1  . dicyclomine (BENTYL) 10 MG capsule TAKE 1 TO 2 CAPSULES BY MOUTH TWICE DAILY (VIAL) 360 capsule 11  . DULoxetine (CYMBALTA) 60 MG capsule TAKE ONE CAPSULE BY MOUTH DAILY AT 9AM 90 capsule 1  . Elastic Bandages & Supports (MEDICAL COMPRESSION SOCKS) MISC Compression sock, medium compression 20-30 mmHg. Use daily for venous insufficiency I87.2 3 each 0  . fluticasone (FLONASE) 50 MCG/ACT nasal spray INSTILL 2 SPRAYS IN EACH NOSTRIL DAILY (BULK) 16 g 11  . furosemide (LASIX) 20 MG tablet TAKE ONE TABLET BY MOUTH DAILY AT 9AM 60 tablet 11  . glucose blood (ACCU-CHEK GUIDE) test strip Use to check sugars once daily. Dx Code-R73.03 100 each 1  . hydrocortisone-pramoxine (ANALPRAM HC SINGLES) 2.5-1 % rectal cream Place 1 Application rectally 3 (three) times daily. 30 g 1  . Incontinence Supply Disposable (COMFORT PROTECT ADULT DIAPER/L) MISC Use as directed for urinary incontinence 90 each 5  . LINZESS 145 MCG CAPS capsule TAKE ONE CAPSULE BY MOUTH DAILY BEFORE BREAKFAST **PLEASE CALL OFFICE TO  SCHEDULE FOLLOW UP (ORIG) 90 capsule 11  . Olopatadine HCl 0.2 % SOLN INSTILL 1 DROP INTO EACH EYE ONCE DAILY    . polyethylene glycol powder (GLYCOLAX/MIRALAX) 17 GM/SCOOP powder Take 17 g by mouth daily as needed for moderate constipation. 255 g 11  . propranolol (INDERAL) 80 MG tablet TAKE ONE TABLET BY MOUTH TWICE DAILY @9AM -5PM 120 tablet 11  . rivastigmine (EXELON) 6 MG capsule Take 1 capsule (6 mg total) by mouth 2 (two) times daily. 60 capsule 5  . telmisartan (MICARDIS) 80 MG tablet TAKE ONE TABLET BY MOUTH DAILY AT 9AM (ORIG) 60 tablet 11   No current facility-administered medications on file prior to visit.     Review of Systems     Objective:  There were no vitals filed for this visit. BP Readings from Last 3 Encounters:  12/21/22 112/64  07/18/22 106/72  05/25/22 122/70   Wt Readings from Last 3 Encounters:  12/21/22 226 lb (102.5 kg)  07/18/22 222 lb (100.7 kg)  05/25/22 226 lb (102.5 kg)   There is no height or weight on file to calculate BMI.    Physical Exam     Lab Results  Component Value Date   WBC 7.5 12/21/2022   HGB 13.1 12/21/2022   HCT 40.5 12/21/2022   PLT 138.0 (L) 12/21/2022   GLUCOSE 91 12/21/2022   CHOL  144 12/21/2022   TRIG 104.0 12/21/2022   HDL 51.90 12/21/2022   LDLDIRECT 143.8 07/01/2012   LDLCALC 71 12/21/2022   ALT 17 12/21/2022   AST 19 12/21/2022   NA 142 12/21/2022   K 3.5 12/21/2022   CL 106 12/21/2022   CREATININE 0.92 12/21/2022   BUN 14 12/21/2022   CO2 32 12/21/2022   TSH 2.46 04/21/2021   INR 0.91 02/08/2011   HGBA1C 5.5 12/21/2022   MICROALBUR 0.8 04/09/2022     Assessment & Plan:    See Problem List for Assessment and Plan of chronic medical problems.    This encounter was created in error - please disregard.

## 2022-12-19 ENCOUNTER — Encounter: Payer: Medicare HMO | Admitting: Internal Medicine

## 2022-12-19 DIAGNOSIS — M159 Polyosteoarthritis, unspecified: Secondary | ICD-10-CM

## 2022-12-19 DIAGNOSIS — F02B2 Dementia in other diseases classified elsewhere, moderate, with psychotic disturbance: Secondary | ICD-10-CM

## 2022-12-19 DIAGNOSIS — I10 Essential (primary) hypertension: Secondary | ICD-10-CM

## 2022-12-19 DIAGNOSIS — M545 Low back pain, unspecified: Secondary | ICD-10-CM

## 2022-12-19 DIAGNOSIS — E119 Type 2 diabetes mellitus without complications: Secondary | ICD-10-CM

## 2022-12-19 DIAGNOSIS — E782 Mixed hyperlipidemia: Secondary | ICD-10-CM

## 2022-12-19 NOTE — Assessment & Plan Note (Signed)
Chronic Lab Results  Component Value Date   HGBA1C 5.7 04/09/2022   Sugars have been controlled with diet Continue diabetic diet Encouraged regular exercise 

## 2022-12-19 NOTE — Assessment & Plan Note (Signed)
Chronic Regular exercise and healthy diet encouraged Check lipid panel  Continue atorvastatin 10 mg daily 

## 2022-12-19 NOTE — Assessment & Plan Note (Signed)
Chronic °Has seen Dr. Ramo's in the past-he advised physical therapy, but she was not able to afford that at that time °Discussed that her back pain may be arthritic in nature °Advised to follow-up with Dr. Ramo's °

## 2022-12-19 NOTE — Assessment & Plan Note (Signed)
Chronic Following with Dr. Everlena Cooper Currently on Cymbalta 60 mg daily-continue Currently taking zyprexa 5 mg HS and exelon 6 mg bid

## 2022-12-19 NOTE — Assessment & Plan Note (Signed)
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  May benefit from another injection in her joints She is doing some exercise-stressed the importance of continuing this Working on weight loss-encouraged weight loss efforts She would benefit from a lift chair-paperwork filled out and will be sent today 

## 2022-12-19 NOTE — Assessment & Plan Note (Signed)
Chronic Blood pressure well controlled CMP, cbc Continue amlodipine 5 mg once daily, propranolol 80 mg twice daily, telmisartan 80 mg nightly

## 2022-12-20 NOTE — Patient Instructions (Addendum)
      Blood work was ordered.   The lab is on the first floor.    Medications changes include :   omeprazole 20 mg for your stomach, rozerem at bedtime.      Return in about 6 months (around 06/22/2023) for follow up.

## 2022-12-20 NOTE — Progress Notes (Signed)
Subjective:    Patient ID: Janet Le, female    DOB: Sep 25, 1955, 67 y.o.   MRN: [id number]     HPI Janet Le is here for follow up of her chronic medical problems.  She is here with her grandson.   She is a poor historian secondary to her dementia.  She is here with her grandson.  Her daughter was on the phone for part of the visit.  Pain in right side of abdomen. May occur 2/week.  Maybe occurs after eating.  Not cramping like pain.  Maybe more of a heartburn like pain.    Clear discharge from vaginal area  - no blood.    She is not sure if it was coming from the vagina-states it was just on her Pap.  When questioned if it was measuring she states it could have been.  She does have frequent urination.  Sleeps during day and up all night.  Bed at 7-8 pm.  Gets up at around 1 pm.   Some days she sleeps all day.  Some nights she has difficulty going to sleep.  Sometimes she does wake up frequently.  It is difficult to get a good history on her sleeping habits and they do vary.  Medications and allergies reviewed with patient and updated if appropriate.  Current Outpatient Medications on File Prior to Visit  Medication Sig Dispense Refill   Accu-Chek FastClix Lancets MISC USE AS DIRECTED UP  TO  1 TIME  DAILY- DX CODE R73.03 100 each 1   acetaminophen (TYLENOL) 325 MG tablet Take 1,300 mg by mouth at bedtime.     amLODipine (NORVASC) 5 MG tablet TAKE ONE TABLET BY MOUTH ONCE DAILY 90 tablet 0   atorvastatin (LIPITOR) 10 MG tablet Take 1 tablet (10 mg total) by mouth daily. 90 tablet 3   Blood Glucose Monitoring Suppl (ACCU-CHEK GUIDE ME) w/Device KIT Use to check blood sugar daily 1 kit 0   calcium carbonate (OS-CAL) 600 MG TABS tablet Take 600 mg by mouth 2 (two) times daily with a meal.     clotrimazole (LOTRIMIN) 1 % cream Apply topically under breasts daily PRN 30 g 1   Continuous Blood Gluc Receiver (FREESTYLE LIBRE 14 DAY READER) DEVI UAD to check sugars.  E11.9 1 each 0    Continuous Blood Gluc Sensor (FREESTYLE LIBRE 14 DAY SENSOR) MISC UAD to check sugars.  E11.9 2 each 5   dicyclomine (BENTYL) 10 MG capsule TAKE 1 TO 2 CAPSULES BY MOUTH TWICE DAILY (VIAL) 360 capsule 11   DULoxetine (CYMBALTA) 60 MG capsule TAKE ONE CAPSULE BY MOUTH DAILY AT 9AM 90 capsule 1   Elastic Bandages & Supports (MEDICAL COMPRESSION SOCKS) MISC Compression sock, medium compression 20-30 mmHg. Use daily for venous insufficiency I87.2 3 each 0   fluticasone (FLONASE) 50 MCG/ACT nasal spray INSTILL 2 SPRAYS IN EACH NOSTRIL DAILY (BULK) 16 g 11   folic acid (FOLVITE) 1 MG tablet TAKE ONE TABLET BY MOUTH DAILY AT 9AM 240 tablet 11   furosemide (LASIX) 20 MG tablet TAKE ONE TABLET BY MOUTH DAILY AT 9AM 60 tablet 11   glucose blood (ACCU-CHEK GUIDE) test strip Use to check sugars once daily. Dx Code-R73.03 100 each 1   hydrocortisone-pramoxine (ANALPRAM HC SINGLES) 2.5-1 % rectal cream Place 1 Application rectally 3 (three) times daily. 30 g 1   Incontinence Supply Disposable (COMFORT PROTECT ADULT DIAPER/L) MISC Use as directed for urinary incontinence 90 each 5  LINZESS 145 MCG CAPS capsule TAKE ONE CAPSULE BY MOUTH DAILY BEFORE BREAKFAST **PLEASE CALL OFFICE TO SCHEDULE FOLLOW UP (ORIG) 90 capsule 11   methotrexate 2.5 MG tablet Take 10 mg by mouth once a week. Mondays     OLANZapine (ZYPREXA) 5 MG tablet Take 1 tablet (5 mg total) by mouth at bedtime. 30 tablet 0   Olopatadine HCl 0.2 % SOLN INSTILL 1 DROP INTO EACH EYE ONCE DAILY     polyethylene glycol powder (GLYCOLAX/MIRALAX) 17 GM/SCOOP powder Take 17 g by mouth daily as needed for moderate constipation. 255 g 11   propranolol (INDERAL) 80 MG tablet TAKE ONE TABLET BY MOUTH TWICE DAILY @9AM -5PM 120 tablet 11   ramelteon (ROZEREM) 8 MG tablet Take 1 tablet (8 mg total) by mouth at bedtime. 30 tablet 3   rivastigmine (EXELON) 6 MG capsule Take 1 capsule (6 mg total) by mouth 2 (two) times daily. 60 capsule 5   Semaglutide,0.25 or  0.5MG /DOS, (OZEMPIC, 0.25 OR 0.5 MG/DOSE,) 2 MG/3ML SOPN INJECT 0.5 MG SUBCUTANEOUSLY ONCE A WEEK. Return in about 6 months (around 04/10/2023) for Physica 3 mL 3   telmisartan (MICARDIS) 80 MG tablet TAKE ONE TABLET BY MOUTH DAILY AT 9AM (ORIG) 60 tablet 11   No current facility-administered medications on file prior to visit.     Review of Systems  Constitutional:  Negative for fever.  Respiratory:  Negative for cough, shortness of breath and wheezing.   Cardiovascular:  Positive for leg swelling. Negative for chest pain and palpitations.  Gastrointestinal:  Positive for abdominal pain.       Gerd  Genitourinary:  Positive for frequency. Negative for dysuria.  Neurological:  Positive for light-headedness (occ). Negative for headaches.       Objective:   Vitals:   12/21/22 1347  BP: 112/64  Pulse: 79  Temp: 97.9 F (36.6 C)  SpO2: 98%   BP Readings from Last 3 Encounters:  12/21/22 112/64  07/18/22 106/72  05/25/22 122/70   Wt Readings from Last 3 Encounters:  12/21/22 226 lb (102.5 kg)  07/18/22 222 lb (100.7 kg)  05/25/22 226 lb (102.5 kg)   Body mass index is 42.7 kg/m.    Physical Exam Constitutional:      General: She is not in acute distress.    Appearance: Normal appearance.  HENT:     Head: Normocephalic and atraumatic.  Eyes:     Conjunctiva/sclera: Conjunctivae normal.  Cardiovascular:     Rate and Rhythm: Normal rate and regular rhythm.     Heart sounds: Normal heart sounds.  Pulmonary:     Effort: Pulmonary effort is normal. No respiratory distress.     Breath sounds: Normal breath sounds. No wheezing.  Musculoskeletal:     Cervical back: Neck supple.     Right lower leg: No edema.     Left lower leg: No edema.  Lymphadenopathy:     Cervical: No cervical adenopathy.  Skin:    General: Skin is warm and dry.     Findings: No rash.  Neurological:     Mental Status: She is alert. Mental status is at baseline.  Psychiatric:        Mood and  Affect: Mood normal.        Behavior: Behavior normal.        Lab Results  Component Value Date   WBC 7.2 04/09/2022   HGB 13.5 04/09/2022   HCT 41.4 04/09/2022   PLT 136.0 (L) 04/09/2022   GLUCOSE  93 04/09/2022   CHOL 249 (H) 04/09/2022   TRIG 95.0 04/09/2022   HDL 65.30 04/09/2022   LDLDIRECT 143.8 07/01/2012   LDLCALC 165 (H) 04/09/2022   ALT 9 04/09/2022   AST 16 04/09/2022   NA 140 04/09/2022   K 3.8 04/09/2022   CL 104 04/09/2022   CREATININE 0.84 04/09/2022   BUN 7 04/09/2022   CO2 29 04/09/2022   TSH 2.46 04/21/2021   INR 0.91 02/08/2011   HGBA1C 5.7 04/09/2022   MICROALBUR 0.8 04/09/2022     Assessment & Plan:    See Problem List for Assessment and Plan of chronic medical problems.

## 2022-12-21 ENCOUNTER — Ambulatory Visit (INDEPENDENT_AMBULATORY_CARE_PROVIDER_SITE_OTHER): Payer: Medicare HMO | Admitting: Internal Medicine

## 2022-12-21 ENCOUNTER — Encounter: Payer: Self-pay | Admitting: Internal Medicine

## 2022-12-21 VITALS — BP 112/64 | HR 79 | Temp 97.9°F | Ht 61.0 in | Wt 226.0 lb

## 2022-12-21 DIAGNOSIS — Z7985 Long-term (current) use of injectable non-insulin antidiabetic drugs: Secondary | ICD-10-CM

## 2022-12-21 DIAGNOSIS — G4700 Insomnia, unspecified: Secondary | ICD-10-CM | POA: Insufficient documentation

## 2022-12-21 DIAGNOSIS — F3289 Other specified depressive episodes: Secondary | ICD-10-CM

## 2022-12-21 DIAGNOSIS — F02B2 Dementia in other diseases classified elsewhere, moderate, with psychotic disturbance: Secondary | ICD-10-CM

## 2022-12-21 DIAGNOSIS — K219 Gastro-esophageal reflux disease without esophagitis: Secondary | ICD-10-CM

## 2022-12-21 DIAGNOSIS — E119 Type 2 diabetes mellitus without complications: Secondary | ICD-10-CM

## 2022-12-21 DIAGNOSIS — I10 Essential (primary) hypertension: Secondary | ICD-10-CM

## 2022-12-21 DIAGNOSIS — G3183 Dementia with Lewy bodies: Secondary | ICD-10-CM

## 2022-12-21 DIAGNOSIS — E782 Mixed hyperlipidemia: Secondary | ICD-10-CM | POA: Diagnosis not present

## 2022-12-21 DIAGNOSIS — G479 Sleep disorder, unspecified: Secondary | ICD-10-CM

## 2022-12-21 DIAGNOSIS — M159 Polyosteoarthritis, unspecified: Secondary | ICD-10-CM

## 2022-12-21 HISTORY — DX: Insomnia, unspecified: G47.00

## 2022-12-21 LAB — CBC WITH DIFFERENTIAL/PLATELET
Basophils Absolute: 0 10*3/uL (ref 0.0–0.1)
Basophils Relative: 0.3 % (ref 0.0–3.0)
Eosinophils Absolute: 0.1 10*3/uL (ref 0.0–0.7)
Eosinophils Relative: 1.6 % (ref 0.0–5.0)
HCT: 40.5 % (ref 36.0–46.0)
Hemoglobin: 13.1 g/dL (ref 12.0–15.0)
Lymphocytes Relative: 26 % (ref 12.0–46.0)
Lymphs Abs: 1.9 10*3/uL (ref 0.7–4.0)
MCHC: 32.2 g/dL (ref 30.0–36.0)
MCV: 97.6 fl (ref 78.0–100.0)
Monocytes Absolute: 0.5 10*3/uL (ref 0.1–1.0)
Monocytes Relative: 7.2 % (ref 3.0–12.0)
Neutro Abs: 4.8 10*3/uL (ref 1.4–7.7)
Neutrophils Relative %: 64.9 % (ref 43.0–77.0)
Platelets: 138 10*3/uL — ABNORMAL LOW (ref 150.0–400.0)
RBC: 4.15 Mil/uL (ref 3.87–5.11)
RDW: 14.6 % (ref 11.5–15.5)
WBC: 7.5 10*3/uL (ref 4.0–10.5)

## 2022-12-21 LAB — COMPREHENSIVE METABOLIC PANEL
ALT: 17 U/L (ref 0–35)
AST: 19 U/L (ref 0–37)
Albumin: 3.6 g/dL (ref 3.5–5.2)
Alkaline Phosphatase: 105 U/L (ref 39–117)
BUN: 14 mg/dL (ref 6–23)
CO2: 32 mEq/L (ref 19–32)
Calcium: 9.9 mg/dL (ref 8.4–10.5)
Chloride: 106 mEq/L (ref 96–112)
Creatinine, Ser: 0.92 mg/dL (ref 0.40–1.20)
GFR: 64.47 mL/min (ref >60.00)
Glucose, Bld: 91 mg/dL (ref 70–99)
Potassium: 3.5 mEq/L (ref 3.5–5.1)
Sodium: 142 mEq/L (ref 135–145)
Total Bilirubin: 0.4 mg/dL (ref 0.2–1.2)
Total Protein: 6.7 g/dL (ref 6.0–8.3)

## 2022-12-21 LAB — LIPID PANEL
Cholesterol: 144 mg/dL (ref 0–200)
HDL: 51.9 mg/dL (ref >39.00)
LDL Cholesterol: 71 mg/dL (ref 0–99)
NonHDL: 92.25
Total CHOL/HDL Ratio: 3
Triglycerides: 104 mg/dL (ref 0.0–149.0)
VLDL: 20.8 mg/dL (ref 0.0–40.0)

## 2022-12-21 LAB — HEMOGLOBIN A1C: Hgb A1c MFr Bld: 5.5 % (ref 4.6–6.5)

## 2022-12-21 MED ORDER — FREESTYLE LIBRE 14 DAY SENSOR MISC: 5 refills | Status: DC

## 2022-12-21 MED ORDER — RAMELTEON 8 MG PO TABS: 8.0000 mg | ORAL_TABLET | Freq: Every day | ORAL | 5 refills | Status: DC

## 2022-12-21 MED ORDER — OMEPRAZOLE 20 MG PO CPDR: 20.0000 mg | DELAYED_RELEASE_CAPSULE | Freq: Every day | ORAL | 5 refills | Status: DC

## 2022-12-21 MED ORDER — FREESTYLE LIBRE 14 DAY READER DEVI
0 refills | Status: DC
Start: 2022-12-21 — End: 2023-07-29

## 2022-12-21 MED ORDER — OZEMPIC (0.25 OR 0.5 MG/DOSE) 2 MG/3ML ~~LOC~~ SOPN: PEN_INJECTOR | SUBCUTANEOUS | 5 refills | Status: DC

## 2022-12-21 NOTE — Assessment & Plan Note (Signed)
Chronic Following with Dr. Everlena Cooper Currently on Cymbalta 60 mg daily-continue Currently taking exelon 6 mg bid

## 2022-12-21 NOTE — Assessment & Plan Note (Signed)
Chronic Difficult to get an accurate history about her sleep habits, but they seem to vary and sometimes sleeping 20 hours, sometimes having difficulty to get back to sleep, sometimes napping during the day, sometimes waking frequently Encouraged him to keep a sleep log to better understand what her habits are Advised no napping during the day Advised setting a sleep time and wake time Discussed that she should not be sleeping more than 12 hours Will retry rozerem to see if that helps with sleep

## 2022-12-21 NOTE — Assessment & Plan Note (Signed)
Acute Having GERD symptoms and right upper abdominal pain Start omeprazole 20 mg daily

## 2022-12-21 NOTE — Assessment & Plan Note (Signed)
Chronic Blood pressure well controlled CMP, cbc Continue propranolol 80 mg twice daily, telmisartan 80 mg nightly? -She states she is taking this, but it is not in the pill pack-she and her grandson can check when they get home

## 2022-12-21 NOTE — Assessment & Plan Note (Signed)
Chronic Lab Results  Component Value Date   HGBA1C 5.7 04/09/2022   Sugars have been controlled  Continue Ozempic 0.5 mg weekly Encouraged regular exercise

## 2022-12-21 NOTE — Assessment & Plan Note (Signed)
Chronic Controlled Continue Cymbalta 60 mg daily

## 2022-12-21 NOTE — Assessment & Plan Note (Signed)
Chronic Regular exercise and healthy diet encouraged Check lipid panel  Continue atorvastatin 10 mg daily 

## 2022-12-25 ENCOUNTER — Telehealth: Payer: Self-pay

## 2022-12-25 ENCOUNTER — Other Ambulatory Visit (HOSPITAL_COMMUNITY): Payer: Self-pay

## 2022-12-25 NOTE — Telephone Encounter (Signed)
Pharmacy Patient Advocate Encounter   Received notification that prior authorization for Freestyle Libre 14 day reader device is required/requested.   PA submitted to [facility] via CoverMyMeds Key  # [id number] Status is pending

## 2022-12-27 ENCOUNTER — Other Ambulatory Visit (HOSPITAL_COMMUNITY): Payer: Self-pay

## 2022-12-31 ENCOUNTER — Other Ambulatory Visit (HOSPITAL_COMMUNITY): Payer: Self-pay

## 2022-12-31 ENCOUNTER — Telehealth: Payer: Self-pay | Admitting: Internal Medicine

## 2022-12-31 ENCOUNTER — Other Ambulatory Visit: Payer: Self-pay

## 2022-12-31 DIAGNOSIS — E119 Type 2 diabetes mellitus without complications: Secondary | ICD-10-CM

## 2022-12-31 MED ORDER — FREESTYLE LIBRE 3 SENSOR MISC
1.0000 [IU] | 4 refills | Status: DC
Start: 2022-12-31 — End: 2023-07-29

## 2022-12-31 MED ORDER — FREESTYLE LIBRE 3 READER DEVI
1.0000 [IU] | 0 refills | Status: DC | PRN
Start: 2022-12-31 — End: 2023-07-29

## 2022-12-31 NOTE — Telephone Encounter (Signed)
Pharmacy Patient Advocate Encounter  Received notification from [facility] that the request for prior authorization for Freestyle Libre 14 day reader device has been denied due to the Medicare rule in the Medicare Benefit Policy Manual says a medical device is covered under the Part B (medical) benefit when the use is reasonable and necessary for treating the individual patient.     Placed a call to [facility] to see if they could bill to Medicare Part B and the pharmacist stated the 14 day reader has been discontinued and if the provider could send in new orders for [facility] 3 reader and sensors.

## 2022-12-31 NOTE — Telephone Encounter (Signed)
Freestyle Libre 3 sent in with sensors

## 2022-12-31 NOTE — Telephone Encounter (Signed)
Patient's daughter called and said ramelteon (ROZEREM) 8 MG tablet is not covered by patient's insurance. They spoke with insurance and got a list of alternatives: Trazadone, Belsomra, and Zolpidem. Best callback is [phone].

## 2023-01-01 MED ORDER — TRAZODONE HCL 50 MG PO TABS: 50.0000 mg | ORAL_TABLET | Freq: Every day | ORAL | 5 refills | Status: DC

## 2023-01-01 NOTE — Telephone Encounter (Signed)
Spoke with patient today and script faxed in.  Info given for her with Dr. Lawerance Bach recommendations as well.

## 2023-01-01 NOTE — Telephone Encounter (Signed)
We can try trazodone 50 mg nightly.  I did advise her to keep a sleep journal since she is not a good historian did not have a good sense of her sleep habits.  I did advise her not to be sleeping during the day and she should not be sleeping more than 12 hours continuously.  Present on pending-please send to preferred pharmacy.

## 2023-01-08 ENCOUNTER — Other Ambulatory Visit (HOSPITAL_COMMUNITY): Payer: Self-pay

## 2023-01-08 ENCOUNTER — Telehealth: Payer: Self-pay | Admitting: Internal Medicine

## 2023-01-08 MED ORDER — INSULIN PEN NEEDLE 32G X 4 MM MISC: 0 refills | Status: DC

## 2023-01-08 NOTE — Telephone Encounter (Signed)
Prescription Request  01/08/2023  LOV: 12/21/2022  What is the name of the medication or equipment? Ozempic and needles  Have you contacted your pharmacy to request a refill? Yes   Which pharmacy would you like this sent to?  [id number] - [place] ([place]), [place] - [address] [place] [address] Phone: [phone] Fax: [phone]    Patient notified that their request is being sent to the clinical staff for review and that they should receive a response within 2 business days.   Please advise at Mobile [phone] (mobile)

## 2023-01-08 NOTE — Telephone Encounter (Signed)
Called pt inform MD sent the Ozempic to [facility] on 12/21/22. Pt states she also need the pen needles. Sent rx for BD pen needles

## 2023-01-14 ENCOUNTER — Ambulatory Visit: Payer: Medicare HMO

## 2023-01-14 ENCOUNTER — Ambulatory Visit (INDEPENDENT_AMBULATORY_CARE_PROVIDER_SITE_OTHER): Payer: Medicare HMO | Admitting: Psychology

## 2023-01-14 ENCOUNTER — Encounter: Payer: Self-pay | Admitting: Psychology

## 2023-01-14 DIAGNOSIS — R4189 Other symptoms and signs involving cognitive functions and awareness: Secondary | ICD-10-CM

## 2023-01-14 DIAGNOSIS — G3183 Dementia with Lewy bodies: Secondary | ICD-10-CM | POA: Diagnosis not present

## 2023-01-14 DIAGNOSIS — F0280 Dementia in other diseases classified elsewhere without behavioral disturbance: Secondary | ICD-10-CM | POA: Insufficient documentation

## 2023-01-14 DIAGNOSIS — R441 Visual hallucinations: Secondary | ICD-10-CM

## 2023-01-14 DIAGNOSIS — G4752 REM sleep behavior disorder: Secondary | ICD-10-CM | POA: Diagnosis not present

## 2023-01-14 DIAGNOSIS — F02A3 Dementia in other diseases classified elsewhere, mild, with mood disturbance: Secondary | ICD-10-CM

## 2023-01-14 HISTORY — DX: Dementia in other diseases classified elsewhere, unspecified severity, without behavioral disturbance, psychotic disturbance, mood disturbance, and anxiety: F02.80

## 2023-01-14 NOTE — Progress Notes (Addendum)
NEUROPSYCHOLOGICAL EVALUATION [place] Department of Neurology  Date of Evaluation: January 14, 2023  Reason for Referral:   GENIFER LAZENBY is a 67 y.o. 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   Assessment and Plan:   Clinical Impression(s): Ms. Winning's pattern of performance is suggestive of primary and severe cognitive impairment surrounding processing speed and executive functioning. Additional impairments were exhibited across complex attention, visuospatial abilities, and all aspects of learning and memory. While suggestive of a normative impairment, receptive language is believed to be adequate and impacted by memory dysfunction rather than frank comprehensive deficits. Performances were appropriate relative to age-matched peers across basic attention, verbal fluency, and confrontation naming. Functionally, Ms. Stoneking and her daughter noted that family provides fairly significant assistance with medication reminders and financial management/bill paying reminders. She also no longer drives due to cognitive concern/decline. Given evidence for both cognitive and functional impairment which have both progressively worsened over time, I do believe that Ms. Hodgens has transitioned to a Major Neurocognitive Disorder ("dementia") diagnostic classification at the present time.  In the time between her 2020 and 2022 evaluations, Ms. Salameh exhibited quite significant cognitive decline across essentially all assessed cognitive domains. Declines were most evident across executive functioning and aspects of learning and memory. However, outside of basic attention and semantic fluency, declines of mild  to moderate severity were evident across all utilized tasks. Fortunately, relative to her 2022 evaluation, Ms. Satterly's current evaluation suggests very subtle decline but relative stability across all assessed domains. Her current evaluation does suggest quite significant decline across all cognitive domains when compared directly to her initial 2020 evaluation.   Regarding the cause of her dementia presentation, primary concerns continue to surround underlying Lewy body disease. Historically, Ms. Avallone has demonstrated greatest and earliest impairments surrounding frontal subcortical functions and visuospatial abilities, which is consistent with this illness. She also continues to exhibit balance instability, REM sleep behaviors, and fully-formed visual hallucinations, all of which can be hallmark characteristics of this illness. Progressive cognitive and functional decline over time is further suggestive of a neurodegenerative illness. An additional atypical parkinsonian presentation such as corticobasal degeneration cannot be ruled out given testing patterns and her prior report of involuntary jerking behaviors. However, this is a quite rare presentation and a Lewy body dementia presentation remains most likely.   Recommendations: Ms. Zoeller has already been prescribed a medication aimed to address memory loss and concerns surrounding progressive cognitive decline (i.e., rivastigmine/Exelon). She is encouraged to continue taking this medication as prescribed. It is important to highlight that this medication has been shown to slow functional decline in some individuals. There is no current treatment which can stop or reverse cognitive decline when caused by a neurodegenerative illness.   Ms. Zurita is encouraged to speak with her prescribing physician regarding medication adjustments to optimally manage fairly significant symptoms of both anxiety and depression. Given the extent of cognitive  impairment, I do not feel that she would be a good candidate to fully engage in individual psychotherapy.  I agree with prior recommendations made by members of her medical team and also recommend that Ms. Eckstrom fully abstain from all driving pursuits.   It will be important for Ms. Aaron to have another person with her when in  situations where she may need to process information, weigh the pros and cons of different options, and make decisions, in order to ensure that she fully understands and recalls all information to be considered.  If not already done, Ms. Shore and her family may want to discuss her wishes regarding durable power of attorney and medical decision making, so that she can have input into these choices. If they require legal assistance with this, long-term care resource access, or other aspects of estate planning, they could reach out to [employer] at [phone] for a free consultation. Additionally, they may wish to discuss future plans for caretaking and seek out community options for in home/residential care should they become necessary.  Ms. Kosloski is encouraged to attend to lifestyle factors for brain health (e.g., regular physical exercise, good nutrition habits and consideration of the MIND-DASH diet, regular participation in cognitively-stimulating activities, and general stress management techniques), which are likely to have benefits for both emotional adjustment and cognition. In fact, in addition to promoting good general health, regular exercise incorporating aerobic activities (e.g., brisk walking, jogging, cycling, etc.) has been demonstrated to be a very effective treatment for depression and stress, with similar efficacy rates to both antidepressant medication and psychotherapy. Optimal control of vascular risk factors (including safe cardiovascular exercise and adherence to dietary recommendations) is encouraged. Continued participation in activities which  provide mental stimulation and social interaction is also recommended.   Important information should be provided to Ms. Cieslik in written format in all instances. This information should be placed in a highly frequented and easily visible location within her home to promote recall. External strategies such as written notes in a consistently used memory journal, visual and nonverbal auditory cues such as a calendar on the refrigerator or appointments with alarm, such as on a cell phone, can also help maximize recall.  To address problems with processing speed, she may wish to consider:   -Ensuring that she is alerted when essential material or instructions are being presented   -Adjusting the speed at which new information is presented   -Allowing for more time in comprehending, processing, and responding in conversation   -Repeating and paraphrasing instructions or conversations aloud  To address problems with fluctuating attention and/or executive dysfunction, she may wish to consider:   -Avoiding external distractions when needing to concentrate   -Limiting exposure to fast paced environments with multiple sensory demands   -Writing down complicated information and using checklists   -Attempting and completing one task at a time (i.e., no multi-tasking)   -Verbalizing aloud each step of a task to maintain focus   -Taking frequent breaks during the completion of steps/tasks to avoid fatigue   -Reducing the amount of information considered at one time   -Scheduling more difficult activities for a time of day where she is usually most alert  Review of Records:   Ms. Logie underwent a comprehensive neuropsychological evaluation Crist Fat Ward, Ph.D.) on 07/09/2018. Results suggested probable deficits of processing speed, executive function, visual memory significantly more than verbal memory, and visual construction. It was noted that such a profile may be seen in early Parkinson's disease or a  Parkinson's plus syndrome such as Lewy body dementia. However, her performance may have been skewed due to observed anxiety and mildly suboptimal task persistence. It was also noted that her symptoms may conceivably be somatoform and may be due to premorbid ADHD, emotional stress, and physical disability. Repeat testing was recommended.    Ms. Torain  was seen by [facility] Shon Millet, D.O.) on 11/02/2020 for follow-up of neuropathy and confusion. In March 2022, she was reportedly confused and hallucinating. She was looking out of her home at her car and it appeared there was somebody in her car. When she went out to her car, nobody was there. She also started seeing people, often strangers, who were not truly there in her home. She stated they were coming in through the walls. Since changing from Effexor to Cymbalta, she reported feeling less anxious and did not report any recurrent hallucinations from the end of March up till her appointment with Dr. Everlena Cooper. She reported ongoing numbness, tingling, and burning in her hands and legs. She did exhibit a slight bilateral upper extremity tremor and reported having had eight falls since her last visit (December 2021).    More specific to memory, she started reporting concerns in December 2018. Examples included her misplacing objects or forgetting things she said. Sometimes when she is cooking, she may do something that wasn't required by the recipe. She reported anxiety while driving but no disorientation on familiar routes. She has forgotten to pay bills but then remembers and is able to pay within the grace period. She sometimes has trouble recalling her grandchildren's names for a moment. Labs from May 2019 included normal B12 528 and TSH 1.37. She has been treated for latent TB. Ultimately, Ms. Hoadley was referred for a comprehensive neuropsychological evaluation to characterize her cognitive abilities and to assist with diagnostic clarity and treatment  planning.   She completed a second neuropsychological evaluation with myself on 01/25/2021. Results suggested profound impairment surrounding executive functioning and visuospatial abilities. Additional impairments were exhibited across processing speed and both encoding (i.e., learning) and retrieval aspects of memory, while some variability was exhibited across more complex attention. Relative to her previous evaluation in 2020, Ms. Skoda unfortunately exhibited significant cognitive decline across essentially all assessed cognitive domains. Declines were most evident across executive functioning and aspects of learning and memory. However, outside of basic attention and semantic fluency, declines of mild to moderate severity were evident across all utilized tasks. She was ultimately diagnosed with a mild neurocognitive disorder likely due to Lewy body disease.  She was most recently seen by Dr. Everlena Cooper on 07/18/2022 for follow-up. Rivastigmine had been increased and she was tolerating this medication well. Her family expressed ongoing concerns surrounding increases hallucinations, paranoia, and agitation. Performance on a brief cognitive screening instrument (SLUMS) was 9/30. Ultimately, Ms. Empie was referred for a repeat neuropsychological evaluation to characterize her cognitive abilities and to assist with diagnostic clarity and treatment planning.    Brain MRI on 12/19/2013 revealed a small 11 mm dural calcification with no associated mass effect. No other pertinent findings were reported. Brain MRI on 10/05/2020 revealed minimal small vessel ischemic changes but no other notable abnormalities. No more recent neuroimaging was available for review.   Past Medical History:  Diagnosis Date   Allergic rhinitis 08/31/2009   Arthralgia of hip 11/14/2017   Attention deficit hyperactivity disorder 06/06/2020   diagnosed as adult w/o formal testing   Bilateral leg edema 06/12/2018   Blepharitis 11/07/2019    Blood dyscrasia    platelets low in past   Breast asymmetry 04/08/2021   Cataract    Cervical radiculopathy 09/07/2020   Cervicalgia 05/30/2016   Chronic low back pain 07/18/2016   Degeneration of lumbar intervertebral disc 01/09/2018   Dyspnea on exertion 05/11/2019   Endometriosis    Esophageal stricture  Essential hypertension 05/06/2009   Excessive daytime sleepiness 05/11/2019   External hemorrhoids 08/28/2010   Fatigue 11/14/2017   Generalized anxiety disorder 10/08/2017   GERD (gastroesophageal reflux disease)    Glaucoma 06/06/2020   Gout 12/11/2013   Hiatal hernia    History of positive PPD 04/09/2022   History of latent TB-completed treatment     Hyperlipidemia 03/16/2010   Hypertensive retinopathy of both eyes 10/06/2019   IBS (irritable bowel syndrome) with chronic constipation 11/14/2010   Insomnia 12/21/2022   Iridocyclitis 10/06/2019   Laryngopharyngeal reflux (LPR) 07/03/2018   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  No significant change from 2017  dexa 10/2015: t score  Spine -1.3, dual femur -0.9  Took alendronate 2014-2019   Pain in left knee 09/08/2014   Post traumatic stress disorder (PTSD)    Primary osteoarthritis involving multiple joints 03/10/2019   Bilateral hips, knees   Pseudophakia of both eyes 10/06/2019   Pure hypercholesterolemia 03/16/2010   REM sleep behaviors    Renal cell carcinoma    R cryoablation by IR 02/16/11, Dr. Fredia Sorrow  Followed by Dr. Claire Shown  No evidence of residual disease on  CT 12/13 and 12/14   Rib pain on left side 01/03/2021   S/P knee surgery 10/30/2016   Snoring 04/09/2022   Spondylolisthesis of lumbar region 01/09/2018   Syncope and collapse 05/16/2009   since childhood; associated with extreme heat   TB lung, latent 11/25/2019   Thrombocytopenia 05/30/2015   Saw hem 02/2017 - mild, chronic - advised to stop protonix, no concerning cause, plan for pcp to monitor   Tinnitus of right ear 07/03/2018   Type 2 diabetes mellitus without complication, without long-term current use of insulin 10/06/2019   Urinary incontinence 07/03/2021   Venous insufficiency of both lower extremities 04/06/2021   Visual hallucinations    Vulvar itching 01/03/2021    Past Surgical History:  Procedure Laterality Date   BREAST CYST EXCISION Bilateral over 10 years ago   No visable scar    CATARACT EXTRACTION, BILATERAL Bilateral 2020   COLONOSCOPY     fallopian tube removed     FUNCTIONAL ENDOSCOPIC SINUS SURGERY     HEMORRHOID BANDING     LASER ABLATION OF THE CERVIX     NASAL SINUS SURGERY     RENAL CRYOABLATION  02/16/11   R kidney due to RCC (IR procedure)   TOTAL KNEE ARTHROPLASTY Right 10/30/2016   Procedure: RIGHT TOTAL KNEE ARTHROPLASTY;  Surgeon: Cammy Copa, MD;  Location: [facility] OR;  Service: Orthopedics;  Laterality: Right;   TUBAL LIGATION     UMBILICAL HERNIA REPAIR      Current Outpatient Medications:    Accu-Chek FastClix Lancets MISC, USE AS DIRECTED UP  TO  1 TIME  DAILY- DX CODE R73.03, Disp: 100 each, Rfl: 1   acetaminophen (TYLENOL) 325 MG tablet, Take 1,300 mg by mouth at bedtime., Disp: , Rfl:    atorvastatin (LIPITOR) 10 MG tablet, Take 1 tablet (10 mg total) by mouth daily., Disp: 90 tablet, Rfl: 3  Blood Glucose Monitoring Suppl (ACCU-CHEK GUIDE ME) w/Device KIT, Use to check blood sugar daily, Disp: 1 kit, Rfl: 0   calcium carbonate (OS-CAL) 600 MG TABS tablet, Take 600 mg by mouth 2 (two) times daily with a meal., Disp: , Rfl:     clotrimazole (LOTRIMIN) 1 % cream, Apply topically under breasts daily PRN, Disp: 30 g, Rfl: 1   Continuous Glucose Receiver (FREESTYLE LIBRE 14 DAY READER) DEVI, UAD to check sugars.  E11.9, Disp: 1 each, Rfl: 0   Continuous Glucose Receiver (FREESTYLE LIBRE 3 READER) DEVI, 1 Units by Does not apply route as needed., Disp: 1 each, Rfl: 0   Continuous Glucose Sensor (FREESTYLE LIBRE 14 DAY SENSOR) MISC, UAD to check sugars.  E11.9, Disp: 2 each, Rfl: 5   Continuous Glucose Sensor (FREESTYLE LIBRE 3 SENSOR) MISC, Inject 1 Units into the skin as directed. Place 1 sensor on the skin every 14 days. Use to check glucose continuously, Disp: 2 each, Rfl: 4   dicyclomine (BENTYL) 10 MG capsule, TAKE 1 TO 2 CAPSULES BY MOUTH TWICE DAILY (VIAL), Disp: 360 capsule, Rfl: 11   DULoxetine (CYMBALTA) 60 MG capsule, TAKE ONE CAPSULE BY MOUTH DAILY AT 9AM, Disp: 90 capsule, Rfl: 1   Elastic Bandages & Supports (MEDICAL COMPRESSION SOCKS) MISC, Compression sock, medium compression 20-30 mmHg. Use daily for venous insufficiency I87.2, Disp: 3 each, Rfl: 0   fluticasone (FLONASE) 50 MCG/ACT nasal spray, INSTILL 2 SPRAYS IN EACH NOSTRIL DAILY (BULK), Disp: 16 g, Rfl: 11   furosemide (LASIX) 20 MG tablet, TAKE ONE TABLET BY MOUTH DAILY AT 9AM, Disp: 60 tablet, Rfl: 11   glucose blood (ACCU-CHEK GUIDE) test strip, Use to check sugars once daily. Dx Code-R73.03, Disp: 100 each, Rfl: 1   hydrocortisone-pramoxine (ANALPRAM HC SINGLES) 2.5-1 % rectal cream, Place 1 Application rectally 3 (three) times daily., Disp: 30 g, Rfl: 1   Incontinence Supply Disposable (COMFORT PROTECT ADULT DIAPER/L) MISC, Use as directed for urinary incontinence, Disp: 90 each, Rfl: 5   Insulin Pen Needle 32G X 4 MM MISC, Use to administer Ozempic once a week, Disp: 30 each, Rfl: 0   LINZESS 145 MCG CAPS capsule, TAKE ONE CAPSULE BY MOUTH DAILY BEFORE BREAKFAST **PLEASE CALL OFFICE TO SCHEDULE FOLLOW UP (ORIG), Disp: 90 capsule, Rfl: 11   Olopatadine  HCl 0.2 % SOLN, INSTILL 1 DROP INTO EACH EYE ONCE DAILY, Disp: , Rfl:    omeprazole (PRILOSEC) 20 MG capsule, Take 1 capsule (20 mg total) by mouth daily., Disp: 30 capsule, Rfl: 5   polyethylene glycol powder (GLYCOLAX/MIRALAX) 17 GM/SCOOP powder, Take 17 g by mouth daily as needed for moderate constipation., Disp: 255 g, Rfl: 11   propranolol (INDERAL) 80 MG tablet, TAKE ONE TABLET BY MOUTH TWICE DAILY @9AM -5PM, Disp: 120 tablet, Rfl: 11   rivastigmine (EXELON) 6 MG capsule, Take 1 capsule (6 mg total) by mouth 2 (two) times daily., Disp: 60 capsule, Rfl: 5   Semaglutide,0.25 or 0.5MG /DOS, (OZEMPIC, 0.25 OR 0.5 MG/DOSE,) 2 MG/3ML SOPN, INJECT 0.5 MG SUBCUTANEOUSLY ONCE A WEEK. E11.9, Disp: 3 mL, Rfl: 5   telmisartan (MICARDIS) 80 MG tablet, TAKE ONE TABLET BY MOUTH DAILY AT 9AM (ORIG), Disp: 60 tablet, Rfl: 11   traZODone (DESYREL) 50 MG tablet, Take 1 tablet (50 mg total) by mouth at bedtime., Disp: 30 tablet, Rfl: 5  Clinical Interview:   The following information was obtained during a clinical interview with Ms. Suriano and her daughter [name] prior to cognitive testing.  Cognitive  Symptoms: Decreased short-term memory: Endorsed. She previously reported generalized short-term memory concerns such as forgetting her intention after entering a room, trouble recalling directions or details of conversations, trouble recalling names of familiar individuals, and misplacing/losing things frequently. Currently, she reported similar difficulties, expressing her perception of general stability over time. Her daughter felt that memory difficulties have mildly worsened relative to her previous August 2022 evaluation. Overall, memory dysfunction has been present for the past several years and has progressively worsened over time.  Decreased long-term memory: Denied. Decreased attention/concentration: Endorsed. She previously reported longstanding deficits with sustained attention, trouble focusing, and  increased distractibility. She was reportedly diagnosed with ADHD as an adult several years prior without formal testing. She does not appear to currently be taking any stimulant medication. Difficulties were said to be stable. Reduced processing speed: Endorsed. She described it often "taking a minute" before she is able to process certain things. This was a newly reported symptom relative to her previous evaluation.   Difficulties with executive functions: Endorsed. She previously reported longstanding deficits with complex planning and organization. She generally denied trouble with indecision and reported "sometimes" having trouble with impulsivity. However, no examples for the latter were provided. Overt personality changes were denied. Difficulties were said to be stable.  Difficulties with emotion regulation: Denied. Difficulties with receptive language: Denied. Difficulties with word finding: Endorsed. Decreased visuoperceptual ability: Endorsed.    Difficulties completing ADLs: Endorsed. Medications now arrive in pre-packaged packs. She requires reminders from her family to ensure proper medication adherence. While she remains somewhat involved, she also benefits from reminders and ongoing supervision surrounding financial management and bill paying. She no longer drives at the advice of members of her medical team due to ongoing cognitive impairment.   Additional Medical History: History of traumatic brain injury/concussion: Denied. History of stroke: Denied. History of seizure activity: Denied. History of known exposure to toxins: Denied. Symptoms of chronic pain: Endorsed. She previously reported arthritis and chronic pain, particularly localized to her back, shoulders, and neck. Medical records also suggest a history of hip and knee pain. This was stable. Experience of frequent headaches/migraines: Denied. Frequent instances of dizziness/vertigo: Endorsed. Per medical records, dizziness  started in 2014. She described a feeling of lightheadedness as if she is going to pass out. There is no spinning sensation but she will sometimes hear "static" during these spells. During childhood, she reported instances of syncope, generally associated with extreme heat. She has been found to have low blood pressure and is orthostatic. This was stable.   Sensory changes: Per medical records, visual acuity dysfunction started in 2014 where she described seeing a line of light along the bottom of her visual field in the left eye. She then developed what looked like bubbles floating up in the temporal aspect of the visual fields of in both eyes. This was said to last only a few seconds and occur several times a day. There is no associated vision loss or headache. There was a concern for diabetic retinopathy. Medical records also suggest concerns surrounding glaucoma and cataracts. Currently, Ms. Degrace noted that she can see well enough to read with the help of her glasses. She did not report ongoing hearing loss. She also reported a diminished sense of taste in that everything tastes more bland than normal.  Balance/coordination difficulties: Endorsed. As stated above, Ms. Loth estimated eight falls between December 2021 and March 2022. During her August 2022 evaluation, she estimated another five falls since that time, with her most recent  being about two weeks prior to that evaluation. She reported commonly tripping over things in her environment. She did not report having a propensity to fall in any particular direction. She did report some weakness in her legs but was otherwise unsure of what could be contributing to frequent falling behaviors. Fortunately, she denied any recent falls around the time of the current evaluation.  Other motor difficulties: Endorsed. Per medical records, she reported spontaneous hand movements since early 2019. This has been described as both a tremor and a jerking movement.  For example, if she is holding her cell phone with her index finger on the screen, her index finger will start to tremor or even start tapping. She notices that her hand will shake a bit if she is holding a utensil. During the previous interview, she reported some tremors in her hands even while she is not using them. She also reported occasional lip jerking which can impact her speech. Symptoms were said to have mildly worsened over time.   Sleep History: Estimated hours obtained each night: She was unable to estimate how many hours she sleeps. Difficulties falling asleep: Endorsed. She previously described trouble with insomnia. Difficulties appeared more prominent She noted that her PCP had recently prescribed her trazodone but that this had thus far been ineffective.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

## 2023-01-14 NOTE — Progress Notes (Addendum)
   Psychometrician Note   Cognitive testing was administered to Janet Le by Shan Levans, B.S. (psychometrist) under the supervision of Dr. Newman Nickels, Ph.D., licensed psychologist on 01/14/2023. Janet Le did not appear overtly distressed by the testing session per behavioral observation or responses across self-report questionnaires. Rest breaks were offered.    The battery of tests administered was selected by Dr. Newman Nickels, Ph.D. with consideration to Janet Le's current level of functioning, the nature of her symptoms, emotional and behavioral responses during interview, level of literacy, observed level of motivation/effort, and the nature of the referral question. This battery was communicated to the psychometrist. Communication between Dr. Newman Nickels, Ph.D. and the psychometrist was ongoing throughout the evaluation and Dr. Newman Nickels, Ph.D. was immediately accessible at all times. Dr. Newman Nickels, Ph.D. provided supervision to the psychometrist on the date of this service to the extent necessary to assure the quality of all services provided.    Janet Le will return within approximately 1-2 weeks for an interactive feedback session with Dr. Milbert Coulter at which time her test performances, clinical impressions, and treatment recommendations will be reviewed in detail. Janet Le understands she can contact our office should she require our assistance before this time.  A total of 145 minutes of billable time were spent face-to-face with Janet Le by the psychometrist. This includes both test administration and scoring time. Billing for these services is reflected in the clinical report generated by Dr. Newman Nickels, Ph.D.  This note reflects time spent with the psychometrician and does not include test scores or any clinical interpretations made by Dr. Milbert Coulter. The full report will follow in a separate note.

## 2023-01-17 ENCOUNTER — Ambulatory Visit: Payer: 59 | Admitting: Neurology

## 2023-01-21 ENCOUNTER — Ambulatory Visit (INDEPENDENT_AMBULATORY_CARE_PROVIDER_SITE_OTHER): Payer: 59 | Admitting: Psychology

## 2023-01-21 DIAGNOSIS — F02A3 Dementia in other diseases classified elsewhere, mild, with mood disturbance: Secondary | ICD-10-CM | POA: Diagnosis not present

## 2023-01-21 DIAGNOSIS — G3183 Dementia with Lewy bodies: Secondary | ICD-10-CM | POA: Diagnosis not present

## 2023-01-21 NOTE — Progress Notes (Signed)
   Neuropsychology Feedback Session Janet Le. [facility] [place] Department of Neurology  Reason for Referral:   Janet Le is a 68 y.o. 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   Feedback:   [name] completed a comprehensive neuropsychological evaluation on 01/14/2023. Please refer to that encounter for the full report and recommendations. Briefly, results suggested primary and severe cognitive impairment surrounding processing speed and executive functioning. Additional impairments were exhibited across complex attention, visuospatial abilities, and all aspects of learning and memory. While suggestive of a normative impairment, receptive language is believed to be adequate and impacted by memory dysfunction rather than frank comprehensive deficits. In the time between her 2020 and 2022 evaluations, [name] exhibited quite significant cognitive decline across essentially all assessed cognitive domains. Declines were most evident across executive functioning and aspects of learning and memory. However, outside of basic attention and semantic fluency, declines of mild to moderate severity were evident across all utilized tasks. Fortunately, relative to her 2022 evaluation, [name]'s current evaluation suggests very subtle decline but relative stability across all assessed domains. Her current evaluation does suggest quite significant decline across all cognitive domains when compared directly to her initial 2020 evaluation. Regarding the cause of her dementia presentation, primary concerns continue to surround underlying Lewy body disease.   [name] was accompanied by her daughter during the current  feedback session. Content of the current session focused on the results of her neuropsychological evaluation. [name] was given the opportunity to ask questions and her questions were answered. She was encouraged to reach out should additional questions arise. A copy of her report was provided at the conclusion of the visit.      One unit [phone] was billed for Dr. Tammy Sours time spent preparing for, conducting, and documenting the current feedback session with [name].

## 2023-01-30 ENCOUNTER — Other Ambulatory Visit: Payer: Self-pay | Admitting: Neurology

## 2023-01-30 ENCOUNTER — Other Ambulatory Visit: Payer: Self-pay | Admitting: Internal Medicine

## 2023-01-30 DIAGNOSIS — M542 Cervicalgia: Secondary | ICD-10-CM

## 2023-02-03 NOTE — Progress Notes (Unsigned)
NEUROLOGY FOLLOW UP OFFICE NOTE  Janet Le [id number]  Assessment/Plan:   Lewy body dementia    Increase rivastigmine to 6mg  twice daily She and her family will try non-pharmacologic modalities to address hallucinations, paranoia and agitation.  She does better around family and they will try to be around her more often and are discussing that she moves in with one of her daughters.  If absolutely necessary, we can restart olanzapine.  We discussed the black box warning and while antipsychotic medications are not recommended for psychosis in setting of dementia, we do use them in clinical practice as medication options are limited.   Advised to continue daily activities but should have somebody monitor. Continue to refrain from driving Will try to get skin biopsy to evaluate presence of P-SYN to aid in diagnosis.   Follow up with me in 6 months     Total time spent face to face with patient and daughter discussing diagnosis and management and reviewing chart:  60 minutes.   Subjective:  Janet Le is a 67 year old right-handed woman with hypertension, pre-diabetes, TB, IBS, lymphedema, osteoarthritis and osteoporosis and history of renal cell carcinoma who follows up for Lewy body dementia.  She is accompanied by her daughter (and another daughter via phone) who supplement histor.    UPDATE: Current medications:  rivastigmine 6mg  BID, duloxetine 60mg , ramelteon 8mg  QHS, propranolol 80mg  BID, amlodipine, Lasix   Repeat neuropsychological evaluation on 01/14/2023 demonstrated cogntive decline across all domains, especially executive functioning and aspects of learning and memory, still concerning for Lewy body disease.      HISTORY: I.  COGNITIVE DEFICITS/VISUAL DISTURBANCE/TREMORS: In 2014, she started experiencing visual disturbance.  She describes initially seeing a line of light along the bottom of her visual field in the left eye.  She then developed what looks like  bubbles floating up in the temporal aspect of the visual fields of in both eyes.  It lasts only a few seconds and occurs several times a day.  She also began experiencing dizziness and lightheadedness and was found to have orthostatic hypotension.  In 2018, she began noticing memory problems.  Labs from May 2019 included normal B12 528 and TSH 1.37.  She has been treated for latent TB.   Around this time, she began experiencing tremors in the hands and occasional jerks of her extremities.   She underwent neuropsychological testing 07/09/18.  She demonstrated probable deficits of processing speed, executive function, visual memory significantly more than verbal memory, and Theatre stage manager.  It was noted that such a profile may be seen in early Parkinson's disease or a Parkinson's plus syndrome.  However, her performance may have been skewed due to observed anxiety and mildly suboptimal task persistence.  In March 2022, she was confused and hallucinating.  She was looking out of the house at her car and looked like there was somebody in her car.  When she went out to her car, nobody was there.  She started seeing people, strangers, not there in her house. She said that they were coming in through the walls.  She may quickly think she sees something (like a figure) and when she turns her head to that direction, it is gone.    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  She underwent repeat neuropsychological evaluation on 01/25/2021.  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  Given her history of  gait instability, hallucinations, and tremor, Lewy body dementia suspected. MRI of brain on 12/19/2013 showed small 11 mm left frontal dural calcification vs meningioma at the left vertex.  MRI of brain on 10/05/2020 which showed stable 11 mm left frontal convexity lesion (probable meningioma), minimal  chronic small vessel disease.  Insurance wouldn't approve DaT scan.    Past medications:  quetiapine (increased hallucinations and agitation)   II.  NEUROPATHY:  In 2016, she started experiencing burning pain from the bottom of her feet up the back of her legs to the knees.  It is intermittent and lasts for various and unknown period of time, but not aware of any triggers such as due to position.  In addition, she is reporting increased back pain and spasms, worse when sitting and improved when laying supine.  She has a history of chronic low back pain with left lumbar radiculopathy, as well as right knee pain.  She has been seen and evaluated by orthopedics and pain management.  MRI of lumbar spine from 07/27/16 revealed advanced L4-5 and L5-S1 facet arthrosis with some bilateral neural foraminal stenosis.  Dr. August Saucer of orthopedics did not think surgery was an option at that time.  TSH from 10/15/16 was 1.67.  B12 from 10/18/16 was 537.  Hgb A1c from 04/05/17 was 5.6.  NCV-EMG from 10/25/16 demonstrated no evidence of large fiber sensorimotor polyneuropathy or lumbosacral radiculopathy.  MRI of brain and cervical spine on 10/05/2020 which showed stable 11 mm left frontal convexity meningioma, minimal chronic small vessel disease, moderate cervical spondylosis from C4 through C7 and small central disc protrusion at C7-T1 but no acute intracranial abnormality or spinal stenosis or cord abnormality.   Labs from 11/02/2020 include negative ANA, sed rate 16, ACE 23, SPEP/IFE negative for M-spike.  NCV-EMG on 11/22/2020 now demonstrated chronic sensorimotor axonal polyneuropathy.  PAST MEDICAL HISTORY: Past Medical History:  Diagnosis Date   Allergic rhinitis 08/31/2009   Arthralgia of hip 11/14/2017   Attention deficit hyperactivity disorder 06/06/2020   diagnosed as adult w/o formal testing   Bilateral leg edema 06/12/2018   Blepharitis 11/07/2019   Blood dyscrasia    platelets low in past   Breast asymmetry  04/08/2021   Cataract    Cervical radiculopathy 09/07/2020   Cervicalgia 05/30/2016   Chronic low back pain 07/18/2016   Degeneration of lumbar intervertebral disc 01/09/2018   Dyspnea on exertion 05/11/2019   Endometriosis    Esophageal stricture    Essential hypertension 05/06/2009   Excessive daytime sleepiness 05/11/2019   External hemorrhoids 08/28/2010   Fatigue 11/14/2017   Generalized anxiety disorder 10/08/2017   GERD (gastroesophageal reflux disease)    Glaucoma 06/06/2020   Gout 12/11/2013   Hiatal hernia    History of positive PPD 04/09/2022   History of latent TB-completed treatment     Hyperlipidemia 03/16/2010   Hypertensive retinopathy of both eyes 10/06/2019   IBS (irritable bowel syndrome) with chronic constipation 11/14/2010   Insomnia 12/21/2022   Iridocyclitis 10/06/2019   Laryngopharyngeal reflux (LPR) 07/03/2018   Lattice degeneration of both retinas 10/06/2019   Left lumbar radiculopathy 04/13/2012   Lewy body dementia 01/14/2023   Dr Everlena Cooper     LVH (left ventricular hypertrophy), mild 05/18/2017   Echo 04/2017 - mild LVH, normal EF, Grade 1 DD   Lymphedema    Major depressive disorder 10/08/2017   Migraine 06/14/2009   Morbid obesity 11/06/2013   Neuropathy 10/07/2017   Nocturnal hypoxemia 05/11/2019   Osteopenia 11/18/2015   06/10/2018:  LFN -1.1, left radius 0.7, low FRAX.  No significant change from 2017  dexa 10/2015: t score  Spine -1.3, dual femur -0.9  Took alendronate 2014-2019   Pain in left knee 09/08/2014   Post traumatic stress disorder (PTSD)    Primary osteoarthritis involving multiple joints 03/10/2019   Bilateral hips, knees   Pseudophakia of both eyes 10/06/2019   Pure hypercholesterolemia 03/16/2010   REM sleep behaviors    Renal cell carcinoma    R cryoablation by IR 02/16/11, Dr. Fredia Sorrow  Followed by Dr. Claire Shown  No evidence of residual disease on CT 12/13 and 12/14   Rib pain on left side 01/03/2021   S/P knee surgery  10/30/2016   Snoring 04/09/2022   Spondylolisthesis of lumbar region 01/09/2018   Syncope and collapse 05/16/2009   since childhood; associated with extreme heat   TB lung, latent 11/25/2019   Thrombocytopenia 05/30/2015   Saw hem 02/2017 - mild, chronic - advised to stop protonix, no concerning cause, plan for pcp to monitor   Tinnitus of right ear 07/03/2018   Type 2 diabetes mellitus without complication, without long-term current use of insulin 10/06/2019   Urinary incontinence 07/03/2021   Venous insufficiency of both lower extremities 04/06/2021   Visual hallucinations    Vulvar itching 01/03/2021    MEDICATIONS: Current Outpatient Medications on File Prior to Visit  Medication Sig Dispense Refill   Accu-Chek FastClix Lancets MISC USE AS DIRECTED UP  TO  1 TIME  DAILY- DX CODE R73.03 100 each 1   acetaminophen (TYLENOL) 325 MG tablet Take 1,300 mg by mouth at bedtime.     atorvastatin (LIPITOR) 10 MG tablet Take 1 tablet (10 mg total) by mouth daily. 90 tablet 3   Blood Glucose Monitoring Suppl (ACCU-CHEK GUIDE ME) w/Device KIT Use to check blood sugar daily 1 kit 0   calcium carbonate (OS-CAL) 600 MG TABS tablet Take 600 mg by mouth 2 (two) times daily with a meal.     clotrimazole (LOTRIMIN) 1 % cream Apply topically under breasts daily PRN 30 g 1   Continuous Glucose Receiver (FREESTYLE LIBRE 14 DAY READER) DEVI UAD to check sugars.  E11.9 1 each 0   Continuous Glucose Receiver (FREESTYLE LIBRE 3 READER) DEVI 1 Units by Does not apply route as needed. 1 each 0   Continuous Glucose Sensor (FREESTYLE LIBRE 14 DAY SENSOR) MISC UAD to check sugars.  E11.9 2 each 5   Continuous Glucose Sensor (FREESTYLE LIBRE 3 SENSOR) MISC Inject 1 Units into the skin as directed. Place 1 sensor on the skin every 14 days. Use to check glucose continuously 2 each 4   dicyclomine (BENTYL) 10 MG capsule TAKE 1 TO 2 CAPSULES BY MOUTH TWICE DAILY (VIAL) 360 capsule 11   DULoxetine (CYMBALTA) 60 MG  capsule TAKE ONE CAPSULE BY MOUTH DAILY AT 9AM 90 capsule 11   Elastic Bandages & Supports (MEDICAL COMPRESSION SOCKS) MISC Compression sock, medium compression 20-30 mmHg. Use daily for venous insufficiency I87.2 3 each 0   fluticasone (FLONASE) 50 MCG/ACT nasal spray INSTILL 2 SPRAYS IN EACH NOSTRIL DAILY (BULK) 16 g 11   furosemide (LASIX) 20 MG tablet TAKE ONE TABLET BY MOUTH DAILY AT 9AM 60 tablet 11   glucose blood (ACCU-CHEK GUIDE) test strip Use to check sugars once daily. Dx Code-R73.03 100 each 1   hydrocortisone-pramoxine (ANALPRAM HC SINGLES) 2.5-1 % rectal cream Place 1 Application rectally 3 (three) times daily. 30 g 1   Incontinence Supply Disposable (COMFORT  PROTECT ADULT DIAPER/L) MISC Use as directed for urinary incontinence 90 each 5   Insulin Pen Needle 32G X 4 MM MISC Use to administer Ozempic once a week 30 each 0   LINZESS 145 MCG CAPS capsule TAKE ONE CAPSULE BY MOUTH DAILY BEFORE BREAKFAST **PLEASE CALL OFFICE TO SCHEDULE FOLLOW UP (ORIG) 90 capsule 11   Olopatadine HCl 0.2 % SOLN INSTILL 1 DROP INTO EACH EYE ONCE DAILY     omeprazole (PRILOSEC) 20 MG capsule Take 1 capsule (20 mg total) by mouth daily. 30 capsule 5   polyethylene glycol powder (GLYCOLAX/MIRALAX) 17 GM/SCOOP powder Take 17 g by mouth daily as needed for moderate constipation. 255 g 11   propranolol (INDERAL) 80 MG tablet TAKE ONE TABLET BY MOUTH TWICE DAILY @9AM -5PM 120 tablet 11   rivastigmine (EXELON) 6 MG capsule TAKE ONE CAPSULE (6MG  TOTAL) BY MOUTH TWICE DAILY @9AM -5PM 60 capsule 0   Semaglutide,0.25 or 0.5MG /DOS, (OZEMPIC, 0.25 OR 0.5 MG/DOSE,) 2 MG/3ML SOPN INJECT 0.5 MG SUBCUTANEOUSLY ONCE A WEEK. E11.9 3 mL 5   telmisartan (MICARDIS) 80 MG tablet TAKE ONE TABLET BY MOUTH DAILY AT 9AM (ORIG) 60 tablet 11   traZODone (DESYREL) 50 MG tablet Take 1 tablet (50 mg total) by mouth at bedtime. 30 tablet 5   No current facility-administered medications on file prior to visit.    ALLERGIES: Allergies   Allergen Reactions   Sertraline Hcl Other (See Comments)    Spasms; numbness   Ace Inhibitors Cough   Codeine Palpitations   Darifenacin Hydrobromide Er Other (See Comments)    Pt states made her feel queezy & drunk   Gabapentin Other (See Comments)    Hallucinations   Pravastatin Other (See Comments)    myalgias   Propoxyphene Hcl Palpitations   Seroquel [Quetiapine] Other (See Comments)    Woke up screaming and yelling after taking it   Wellbutrin [Bupropion Hcl] Other (See Comments)    Numbness of mouth/lips    FAMILY HISTORY: Family History  Problem Relation Age of Onset   Diabetes Mother    Hypertension Mother    Hyperlipidemia Mother    Heart disease Mother    Stroke Mother    Kidney disease Mother    Thyroid disease Mother    Sleep apnea Mother    Obesity Mother    Memory loss Mother    Kidney disease Father    Prostate cancer Father    Alcoholism Father    Alcohol abuse Father    Multiple sclerosis Sister    Colitis Brother    Alcohol abuse Brother    Leukemia Brother    Alcohol abuse Daughter    Breast cancer Maternal Aunt    Breast cancer Cousin    Cancer Other    Asthma Other    Colon polyps Other    Colon cancer Neg Hx    Esophageal cancer Neg Hx    Pancreatic cancer Neg Hx    Rectal cancer Neg Hx    Stomach cancer Neg Hx       Objective:  *** General: No acute distress.  Patient appears well-groomed.   Head:  Normocephalic/atraumatic Eyes:  Fundi examined but not visualized Neck: supple, no paraspinal tenderness, full range of motion Heart:  Regular rate and rhythm Neurological Exam: alert and oriented to person, place, and time.  Speech fluent and not dysarthric, language intact.  CN II-XII intact. Bulk and tone normal, no rigidity, no bradykinesia, no tremor, muscle strength 5/5 throughout.  Sensation  to light touch intact.  Deep tendon reflexes 1+ throughout.  Finger to nose testing intact.  Mildly broad-based gait.  Romberg  negative.   Shon Millet, DO  CC: Cheryll Cockayne, MD

## 2023-02-04 ENCOUNTER — Encounter: Payer: Self-pay | Admitting: Neurology

## 2023-02-04 ENCOUNTER — Ambulatory Visit: Payer: Medicare HMO | Admitting: Neurology

## 2023-02-04 VITALS — BP 112/76 | HR 80 | Ht 62.0 in | Wt 220.0 lb

## 2023-02-04 DIAGNOSIS — F02A3 Dementia in other diseases classified elsewhere, mild, with mood disturbance: Secondary | ICD-10-CM

## 2023-02-04 DIAGNOSIS — G3183 Dementia with Lewy bodies: Secondary | ICD-10-CM

## 2023-02-04 DIAGNOSIS — G609 Hereditary and idiopathic neuropathy, unspecified: Secondary | ICD-10-CM

## 2023-02-04 DIAGNOSIS — G4701 Insomnia due to medical condition: Secondary | ICD-10-CM | POA: Diagnosis not present

## 2023-02-04 MED ORDER — TRAZODONE HCL 50 MG PO TABS: 100.0000 mg | ORAL_TABLET | Freq: Every day | ORAL | 1 refills | Status: DC

## 2023-02-04 NOTE — Patient Instructions (Addendum)
Take rivastigmine 6mg  twice daily.  Not 3mg  or 4.5mg  Increase trazodone to 100mg  at bedtime Home health assessment Follow up 6 months.    Lewy Body Dementia Lewy body dementia, or LBD, is a condition that affects the way your brain works. This type of dementia happens when proteins called Lewy bodies build up in the brain. It causes problems with memory, thinking, movement, and behavior. This condition gets worse over time. There's no cure, but some treatments can help with the symptoms. What are the causes? LBD happens when Lewy bodies build up in parts of the brain that control memory, thinking, and movement. What causes these proteins to build up isn't known. What increases the risk? Having a family history of LBD or Parkinson's disease. Being 50 years old or older. Being female. What are the signs or symptoms? Symptoms may include: Hallucinating. This means you see, hear, taste, smell, or feel things that are not real. Sleep problems, such as acting out dreams while you're asleep. Changes in memory, attention, and ability to focus that come and go. Symptoms of dementia, such as having trouble with: Memory. Paying attention. Planning and organizing. Making good choices. Speaking. Behavior. People with LBD may also have symptoms of Parkinson's disease. These may include: Shaking movements or tremors that you can't control. This usually starts in a hand or foot when you're resting. Stooped posture. Slow movement. Stiff muscles. Loss of balance when standing. How is this diagnosed? LBD is diagnosed by an expert called a neurologist. It may be diagnosed based on: Your symptoms and medical history. Your health care provider will talk with you and your family, friends, or caregivers about your history and symptoms. A physical exam. Tests. These may include: Lab tests, such as blood or pee (urine) tests. Imaging tests. You may have a CT scan, a PET scan, or an MRI. A lumbar  puncture. For this test, a sample of the fluid around the brain and spinal cord is taken and tested. A skin biopsy. This is when a skin sample is taken. The sample is looked at under a microscope to check for the protein. Tests to check your thinking and memory. How is this treated? There's no cure for LBD. Treatment helps manage your symptoms and might include: Medicines to help with hallucinations, sleep, and behavior problems. Therapy to help with talking or movement. This might be speech, occupational, and physical therapy. Your provider can help you find support groups and other people who can help with your care. Follow these instructions at home: Medicines Take over-the-counter and prescription medicines only as told by your provider. Use a pill organizer or pill reminder to help keep track of your medicines. Do not take medicines that can affect your thinking. This includes pain medicines and some medicines to help with sleep. Always check with your provider before taking any new medicines. Lifestyle Make healthy choices: Exercise and be active as told by your provider. Do not use any products that contain nicotine or tobacco. These products include cigarettes, chewing tobacco, and vaping devices, such as e-cigarettes. If you need help quitting, ask your provider. When you feel a lot of stress, do something that helps you relax. Try things like mindfulness, yoga, or deep breathing. Spend time with other people. Talk with family, friends, and neighbors often. Make sure you sleep well. These tips can help: Try not to take naps during the day. Keep your bedroom dark and cool. Do not exercise in the few hours before you go to  bed. Do not have foods or drinks with caffeine in them at night. Eating and drinking Do not drink alcohol. Drink enough fluid to keep your pee (urine) pale yellow. Eat a healthy diet. Safety  Talk with your provider to decide: What things you need help  with. How to stay safe. If you have trouble walking, use a cane or walker as told by your provider. Make sure your home is safe. Get rid of things that you could trip over, such as throw rugs or clutter. Put grab bars and railings in your home to keep you from falling. Talk with your provider about whether it's safe for you to drive. If told, wear an alert bracelet that tracks where you are and shows that you're a person with memory loss. Make sure you wear it at all times. General instructions  Work with your family to make big legal or health decisions. This may include things like advance directives, medical power of attorney, or a living will. Join a support group for people with LBD. Where to find more information [employer] Stroke: Basic[employer]no Lewy Body Dementia Association: lbda.org Contact a health care provider if: You have confusion that's new or getting worse. You have trouble sleeping that's new or getting worse. You get sleepy during the day more often. You have problems with choking or swallowing. Get help right away if: You feel depressed or very sad or feel like you may hurt yourself. Your family members are worried about your safety. Get help right away if you feel like you may hurt yourself or others, or have thoughts about taking your own life. Go to your nearest emergency room or: Call 911. Call the National Suicide Prevention Lifeline at (404) 034-6668 or 988. This is open 24 hours a day. Text the Crisis Text Line at 574-102-2158. This information is not intended to replace advice given to you by your health care provider. Make sure you discuss any questions you have with your health care provider. Document Revised: 08/27/2022 Document Reviewed: 08/27/2022 Elsevier Patient Education  2024 [employer] Inc.     Serotonin Syndrome Serotonin is a chemical that helps to control several functions in the body. This chemical is also  called a neurotransmitter. It controls: Brain and nerve cell function. Mood and emotions. Memory. Eating. Sleeping. Sexual activity. Stress response. Having too much serotonin in your body can cause serotonin syndrome. This condition can be harmful to your brain and nerve cells. This can be a life-threatening condition. What are the causes? This condition may be caused by taking medicines or drugs that increase the level of serotonin in your body, such as: Antidepressant medicines. Migraine medicines. Certain pain medicines. Certain drugs, including ecstasy, LSD, cocaine, and amphetamines. Over-the-counter cough or cold medicines that contain dextromethorphan. Certain herbal supplements, including St. John's wort, ginseng, and nutmeg. This condition usually occurs when you take these medicines or drugs together, but it can also happen with a high dose of a single medicine or drug. What increases the risk? You are more likely to develop this condition if: You just started taking a medicine or drug that increases the level of serotonin in the body. You recently increased the dose of a medicine or drug that increases the level of serotonin in the body. You take more than one medicine or drug that increases the level of serotonin in the body. What are the signs or symptoms? Symptoms of this condition usually start within several hours of taking a medicine  or drug. Symptoms may be mild or severe. Mild symptoms include: Sweating. Restlessness or agitation. Muscle twitching or stiffness. Rapid heart rate. Nausea, vomiting, or diarrhea. Shivering or goose bumps. Confusion. Severe symptoms include: Irregular heartbeat. Seizures. Loss of consciousness. High fever. How is this diagnosed? This condition may be diagnosed based on: Your medical history. A physical exam. Your prior use of drugs and medicines. Blood or urine tests. These may be used to rule out other causes of your  symptoms. How is this treated? The treatment for this condition depends on the severity of your symptoms. For mild cases, stopping the medicine or drug that caused your condition is usually all that is needed. For moderate to severe cases, treatment in a hospital may be needed to prevent or treat life-threatening symptoms. Treatment may include: Medicines to control your symptoms. IV fluids. Actions to support your breathing. Treatments to control your body temperature. Follow these instructions at home: Medicines  Take over-the-counter and prescription medicines only as told by your health care provider. Check with your health care provider before you start taking any new prescriptions, over-the-counter medicines, herbs, or supplements. Do not combine any medicines that can cause this condition. Lifestyle  Maintain a healthy lifestyle. Eat a healthy diet that includes plenty of vegetables, fruits, whole grains, low-fat dairy products, and lean protein. Do not eat a lot of foods that are high in fat, added sugars, or salt. Get the right amount and quality of sleep. Most adults need 7-9 hours of sleep each night. Make time to exercise, even if it is only for short periods of time. Most adults should exercise for at least 150 minutes each week. Do not drink alcohol. Do not use illegal drugs. Do not take medicines for reasons other than they are prescribed. General instructions Do not use any products that contain nicotine or tobacco. These products include cigarettes, chewing tobacco, and vaping devices, such as e-cigarettes. If you need help quitting, ask your health care provider. Contact a health care provider if: Your symptoms do not improve or they get worse. Get help right away if: You have worsening confusion, severe headache, chest pain, high fever, seizures, or loss of consciousness. You experience serious side effects of medicine, such as swelling of your face, lips, tongue, or  throat. These symptoms may be an emergency. Get help right away. Call 911. Do not wait to see if the symptoms will go away. Do not drive yourself to the hospital. Also, get help right away if: You have serious thoughts about hurting yourself or others. Take one of these steps if you feel like you may hurt yourself or others, or have thoughts about taking your own life: Go to your nearest emergency room. Call 911. Call the National Suicide Prevention Lifeline at 580-645-0595 or 988. This is open 24 hours a day. Text the Crisis Text Line at (989)032-0600. Summary Serotonin is a chemical that helps to control several functions in the body. High levels of serotonin in the body can cause serotonin syndrome, which can be life-threatening. This condition may be caused by taking medicines or drugs that increase the level of serotonin in your body. Treatment depends on the severity of your symptoms. For mild cases, stopping the medicine or drug that caused your condition is usually all that is needed. Check with your health care provider before you start taking any new prescriptions, over-the-counter medicines, herbs, or supplements. This information is not intended to replace advice given to you by your health care  provider. Make sure you discuss any questions you have with your health care provider. Document Revised: 08/31/2021 Document Reviewed: 08/31/2021 Elsevier Patient Education  2024 [employer].

## 2023-02-08 ENCOUNTER — Telehealth: Payer: Self-pay

## 2023-02-08 MED ORDER — RIVASTIGMINE TARTRATE 6 MG PO CAPS: 6.0000 mg | ORAL_CAPSULE | Freq: Two times a day (BID) | ORAL | 6 refills | Status: DC

## 2023-02-08 NOTE — Telephone Encounter (Signed)
Message left by [facility], Refill needed for Rivastigmine 6 mg.    Per ov note 8/12, Instructed to take rivastigmine to 6mg  twice daily.  Refills sent.

## 2023-03-07 ENCOUNTER — Other Ambulatory Visit: Payer: Self-pay | Admitting: Internal Medicine

## 2023-03-21 ENCOUNTER — Other Ambulatory Visit: Payer: Self-pay | Admitting: Internal Medicine

## 2023-03-25 ENCOUNTER — Other Ambulatory Visit: Payer: Self-pay | Admitting: Internal Medicine

## 2023-03-26 ENCOUNTER — Encounter: Payer: Self-pay | Admitting: Internal Medicine

## 2023-03-26 ENCOUNTER — Telehealth: Payer: Self-pay | Admitting: Internal Medicine

## 2023-03-26 NOTE — Progress Notes (Unsigned)
Subjective:    Patient ID: Janet Le, female    DOB: 1955/08/28, 67 y.o.   MRN: [id number]      HPI Odessie is here for No chief complaint on file.   Scabs on top of head and back of neck -      Medications and allergies reviewed with patient and updated if appropriate.  Current Outpatient Medications on File Prior to Visit  Medication Sig Dispense Refill   Accu-Chek FastClix Lancets MISC USE AS DIRECTED UP  TO  1 TIME  DAILY- DX CODE R73.03 100 each 1   acetaminophen (TYLENOL) 325 MG tablet Take 1,300 mg by mouth at bedtime.     atorvastatin (LIPITOR) 10 MG tablet TAKE ONE TABLET BY MOUTH DAILY AT 5PM 90 tablet 11   Blood Glucose Monitoring Suppl (ACCU-CHEK GUIDE ME) w/Device KIT Use to check blood sugar daily 1 kit 0   calcium carbonate (OS-CAL) 600 MG TABS tablet Take 600 mg by mouth 2 (two) times daily with a meal.     clotrimazole (LOTRIMIN) 1 % cream Apply topically under breasts daily PRN 30 g 1   Continuous Glucose Receiver (FREESTYLE LIBRE 14 DAY READER) DEVI UAD to check sugars.  E11.9 1 each 0   Continuous Glucose Receiver (FREESTYLE LIBRE 3 READER) DEVI 1 Units by Does not apply route as needed. 1 each 0   Continuous Glucose Sensor (FREESTYLE LIBRE 14 DAY SENSOR) MISC UAD to check sugars.  E11.9 2 each 5   Continuous Glucose Sensor (FREESTYLE LIBRE 3 SENSOR) MISC Inject 1 Units into the skin as directed. Place 1 sensor on the skin every 14 days. Use to check glucose continuously 2 each 4   dicyclomine (BENTYL) 10 MG capsule Take 1-2 capsules (10-20 mg total) by mouth 2 (two) times daily as needed for spasms. 180 capsule 0   DULoxetine (CYMBALTA) 60 MG capsule TAKE ONE CAPSULE BY MOUTH DAILY AT 9AM 90 capsule 11   Elastic Bandages & Supports (MEDICAL COMPRESSION SOCKS) MISC Compression sock, medium compression 20-30 mmHg. Use daily for venous insufficiency I87.2 3 each 0   fluticasone (FLONASE) 50 MCG/ACT nasal spray INSTILL 2 SPRAYS IN EACH NOSTRIL DAILY (BULK)  16 g 11   furosemide (LASIX) 20 MG tablet TAKE ONE TABLET BY MOUTH DAILY AT 9AM 60 tablet 11   glucose blood (ACCU-CHEK GUIDE) test strip Use to check sugars once daily. Dx Code-R73.03 100 each 1   hydrocortisone-pramoxine (ANALPRAM HC SINGLES) 2.5-1 % rectal cream Place 1 Application rectally 3 (three) times daily. 30 g 1   Incontinence Supply Disposable (COMFORT PROTECT ADULT DIAPER/L) MISC Use as directed for urinary incontinence 90 each 5   Insulin Pen Needle 32G X 4 MM MISC Use to administer Ozempic once a week 30 each 0   LINZESS 145 MCG CAPS capsule TAKE ONE CAPSULE BY MOUTH DAILY BEFORE BREAKFAST **PLEASE CALL OFFICE TO SCHEDULE FOLLOW UP (ORIG) 90 capsule 11   Olopatadine HCl 0.2 % SOLN INSTILL 1 DROP INTO EACH EYE ONCE DAILY     omeprazole (PRILOSEC) 20 MG capsule Take 1 capsule (20 mg total) by mouth daily. (Patient not taking: Reported on 02/04/2023) 30 capsule 5   polyethylene glycol powder (GLYCOLAX/MIRALAX) 17 GM/SCOOP powder Take 17 g by mouth daily as needed for moderate constipation. 255 g 11   propranolol (INDERAL) 80 MG tablet TAKE ONE TABLET BY MOUTH TWICE DAILY @9AM -5PM 120 tablet 11   rivastigmine (EXELON) 6 MG capsule Take 1 capsule (6 mg total)  by mouth 2 (two) times daily. 60 capsule 6   Semaglutide,0.25 or 0.5MG /DOS, (OZEMPIC, 0.25 OR 0.5 MG/DOSE,) 2 MG/3ML SOPN INJECT 0.5 MG SUBCUTANEOUSLY ONCE A WEEK. E11.9 3 mL 5   telmisartan (MICARDIS) 80 MG tablet TAKE ONE TABLET BY MOUTH DAILY AT 9AM (ORIG) 60 tablet 11   traZODone (DESYREL) 50 MG tablet Take 2 tablets (100 mg total) by mouth at bedtime. 180 tablet 1   No current facility-administered medications on file prior to visit.    Review of Systems     Objective:  There were no vitals filed for this visit. BP Readings from Last 3 Encounters:  02/04/23 112/76  12/21/22 112/64  07/18/22 106/72   Wt Readings from Last 3 Encounters:  02/04/23 220 lb (99.8 kg)  12/21/22 226 lb (102.5 kg)  07/18/22 222 lb (100.7 kg)    There is no height or weight on file to calculate BMI.    Physical Exam         Assessment & Plan:    See Problem List for Assessment and Plan of chronic medical problems.

## 2023-03-26 NOTE — Telephone Encounter (Signed)
Refills have already been sent in

## 2023-03-26 NOTE — Telephone Encounter (Signed)
Prescription Request  03/26/2023  LOV: 12/21/2022  What is the name of the medication or equipment? Atorvastatin, dicyclomine, furosemide  Have you contacted your pharmacy to request a refill? Yes   Which pharmacy would you like this sent to?  [facility] - [place], [place] - [address] [place] [address] Phone: [phone] Fax: [phone]     Patient notified that their request is being sent to the clinical staff for review and that they should receive a response within 2 business days.   Please advise at [facility] [phone]

## 2023-03-27 ENCOUNTER — Telehealth: Payer: Self-pay

## 2023-03-27 ENCOUNTER — Ambulatory Visit: Payer: Medicare HMO | Admitting: Internal Medicine

## 2023-03-27 VITALS — BP 110/74 | HR 68 | Temp 98.7°F | Ht 62.0 in | Wt 222.0 lb

## 2023-03-27 DIAGNOSIS — R21 Rash and other nonspecific skin eruption: Secondary | ICD-10-CM | POA: Diagnosis not present

## 2023-03-27 DIAGNOSIS — G4700 Insomnia, unspecified: Secondary | ICD-10-CM | POA: Diagnosis not present

## 2023-03-27 MED ORDER — FLUTICASONE PROPIONATE 50 MCG/ACT NA SUSP: 2.0000 | Freq: Every day | NASAL | 11 refills | Status: DC

## 2023-03-27 MED ORDER — ZALEPLON 5 MG PO CAPS: 5.0000 mg | ORAL_CAPSULE | Freq: Every evening | ORAL | 0 refills | Status: DC | PRN

## 2023-03-27 MED ORDER — OMEPRAZOLE 20 MG PO CPDR: 20.0000 mg | DELAYED_RELEASE_CAPSULE | Freq: Every day | ORAL | 5 refills | Status: DC

## 2023-03-27 MED ORDER — TRIAMCINOLONE ACETONIDE 0.1 % EX CREA: 1.0000 | TOPICAL_CREAM | Freq: Two times a day (BID) | CUTANEOUS | 0 refills | Status: DC

## 2023-03-27 NOTE — Assessment & Plan Note (Addendum)
Chronic Difficulty falling asleep and staying asleep Trazodone 100 mg not effective-I doubt increasing that will help Discussed concerns with sleep medications and we will feel the benefits of a medication will outweigh the risks Trial of Sonata 5 mg nightly-hopefully if she is able to get to sleep that would help her maintain sleep Continue to avoid caffeine and go to bed at the same time Avoid naps during the day unless they are very short Discussed we may need to do a trial and error to try to find a medication that is effective for her Can consider Seroquel or risperidone if Kathaleen Bury is not effective

## 2023-03-27 NOTE — Assessment & Plan Note (Signed)
Note She has a few lesions on the top of her head and a few lesions in the central upper back ?  Rash versus other They are itchy, but no pain, numbness or tingling She has been itching in the scabs are excoriated Not spreading, no new lesions Unlikely shingles At this point just need to get her to stop itching so that they will heal Trial of triamcinolone cream to the areas-if this is not effective can always apply Vaseline Stressed to her not to itch

## 2023-03-27 NOTE — Patient Instructions (Addendum)
       Medications changes include :   triamcinolone cream for your itchy spots.  Sonata 5 mg nightly for sleep.  Stop trazodone.        Return if symptoms worsen or fail to improve.

## 2023-03-27 NOTE — Telephone Encounter (Signed)
Patient states Home health never called.   Resent home health referral to [facility].

## 2023-04-05 ENCOUNTER — Other Ambulatory Visit: Payer: Self-pay | Admitting: Internal Medicine

## 2023-04-19 ENCOUNTER — Telehealth: Payer: Self-pay | Admitting: Internal Medicine

## 2023-04-19 MED ORDER — ZALEPLON 10 MG PO CAPS: 10.0000 mg | ORAL_CAPSULE | Freq: Every evening | ORAL | 0 refills | Status: DC | PRN

## 2023-04-19 MED ORDER — PROPRANOLOL HCL 80 MG PO TABS: ORAL_TABLET | ORAL | 1 refills | Status: DC

## 2023-04-19 NOTE — Telephone Encounter (Signed)
Prescription Request  04/19/2023  LOV: 03/27/2023  What is the name of the medication or equipment? zaleplon (SONATA) 5 MG capsule  Pt would like her to increase that medication pt stating it do not work and she been up since yesterday.  propranolol (INDERAL) 80 MG tablet   Have you contacted your pharmacy to request a refill? No   Which pharmacy would you like this sent to?    [id number] - [place] ([place]), [place] - [address] [place] [address] Phone: [phone] Fax: [phone]  Patient notified that their request is being sent to the clinical staff for review and that they should receive a response within 2 business days.   Please advise at Mobile [phone] (mobile)   Pt also stated she would like her Linzess decrease due to over working.

## 2023-04-19 NOTE — Telephone Encounter (Signed)
Rx's sent.  Will try higher dose of sonata - not sure if it will work.

## 2023-04-22 ENCOUNTER — Telehealth: Payer: Self-pay | Admitting: Internal Medicine

## 2023-04-22 NOTE — Telephone Encounter (Signed)
Pt called wanting to speak with Janet Le about what dosage she should be taking for ozempic. Please advise.

## 2023-04-23 ENCOUNTER — Other Ambulatory Visit: Payer: Self-pay

## 2023-04-23 DIAGNOSIS — E119 Type 2 diabetes mellitus without complications: Secondary | ICD-10-CM

## 2023-04-23 MED ORDER — SEMAGLUTIDE (1 MG/DOSE) 4 MG/3ML ~~LOC~~ SOPN: 1.0000 mg | PEN_INJECTOR | SUBCUTANEOUS | 0 refills | Status: DC

## 2023-04-23 NOTE — Telephone Encounter (Signed)
Please call today - patient is due for medication

## 2023-04-23 NOTE — Telephone Encounter (Signed)
Spoke with patient today. 

## 2023-04-24 ENCOUNTER — Encounter: Payer: Self-pay | Admitting: Internal Medicine

## 2023-04-24 ENCOUNTER — Other Ambulatory Visit: Payer: Self-pay

## 2023-04-24 NOTE — Telephone Encounter (Signed)
error 

## 2023-04-24 NOTE — Telephone Encounter (Signed)
Spoke with patient today and she has been informed to do the 1 mg.

## 2023-04-24 NOTE — Telephone Encounter (Signed)
Patient called and said she is unsure about what dose she is supposed to be taking. There are two different prescriptions for Ozempic in her chart. One is 0.5 mg with refills and the other is 1 mg. She said she was supposed to get a call back. Patient would like a call back at [phone].

## 2023-04-25 MED ORDER — BELSOMRA 10 MG PO TABS: 10.0000 mg | ORAL_TABLET | Freq: Every evening | ORAL | 0 refills | Status: DC | PRN

## 2023-04-25 NOTE — Telephone Encounter (Signed)
Stop sonata   We can try belsomra - would start at 10 mg nightly  - she needs to take this for a few weeks to see if it works.  We may need to increase it.   Rx pending - where does she want it sent?   She was seeing a therapist at behavioral health - ideally she should go back there

## 2023-04-29 ENCOUNTER — Other Ambulatory Visit: Payer: Self-pay | Admitting: Internal Medicine

## 2023-05-08 ENCOUNTER — Other Ambulatory Visit: Payer: Self-pay | Admitting: Physician Assistant

## 2023-05-21 ENCOUNTER — Telehealth: Payer: Self-pay | Admitting: Internal Medicine

## 2023-05-21 NOTE — Telephone Encounter (Signed)
FYI Pt called trying to get medication refilled someone in the background was helping her out and got upset because I ask the the pt I need the name of the medication because she has a lot of medication on her list. They hung up and said "she don't like my attitude".

## 2023-05-21 NOTE — Telephone Encounter (Signed)
Pt daughter called back wanting ask the nurse about a medication that she do not know the name of please advise

## 2023-05-22 NOTE — Telephone Encounter (Signed)
Message left for patient to return call to clinic.

## 2023-05-27 ENCOUNTER — Other Ambulatory Visit: Payer: Self-pay | Admitting: Internal Medicine

## 2023-05-27 DIAGNOSIS — E119 Type 2 diabetes mellitus without complications: Secondary | ICD-10-CM

## 2023-06-11 ENCOUNTER — Telehealth: Payer: Self-pay | Admitting: Physician Assistant

## 2023-06-11 NOTE — Telephone Encounter (Signed)
[facility] called to get a PA for Linzess. I advised that she needs to schedule OV to get refill. Left a VM for her to schedule.

## 2023-06-18 ENCOUNTER — Other Ambulatory Visit: Payer: Self-pay | Admitting: Physician Assistant

## 2023-06-27 ENCOUNTER — Other Ambulatory Visit: Payer: Self-pay | Admitting: Internal Medicine

## 2023-07-01 ENCOUNTER — Other Ambulatory Visit: Payer: Self-pay | Admitting: Internal Medicine

## 2023-07-01 DIAGNOSIS — E119 Type 2 diabetes mellitus without complications: Secondary | ICD-10-CM

## 2023-07-18 ENCOUNTER — Other Ambulatory Visit: Payer: Self-pay | Admitting: Physician Assistant

## 2023-07-25 ENCOUNTER — Other Ambulatory Visit: Payer: Self-pay | Admitting: Neurology

## 2023-07-28 ENCOUNTER — Encounter: Payer: Self-pay | Admitting: Internal Medicine

## 2023-07-28 NOTE — Patient Instructions (Addendum)
      Blood work was ordered.       Medications changes include :   decrease propranolol to 40 mg twice a day.  Decrease linzess to 72 mcg daily for constipation.  Belsomra for sleep.      Return in about 6 months (around 01/26/2024) for follow up.

## 2023-07-28 NOTE — Progress Notes (Unsigned)
Subjective:    Patient ID: Janet Le, female    DOB: 03/07/1956, 68 y.o.   MRN: [id number]     HPI Janet Le is here for follow up of her chronic medical problems.  She is eating pretty well.  She eats a fair amount of protein.  She is minimally active.    Uses cane in house.  Uses walker outside of house.   Medications and allergies reviewed with patient and updated if appropriate.  Current Outpatient Medications on File Prior to Visit  Medication Sig Dispense Refill   Accu-Chek FastClix Lancets MISC USE AS DIRECTED UP  TO  1 TIME  DAILY- DX CODE R73.03 100 each 1   acetaminophen (TYLENOL) 325 MG tablet Take 1,300 mg by mouth at bedtime.     atorvastatin (LIPITOR) 10 MG tablet TAKE ONE TABLET BY MOUTH DAILY AT 5PM 90 tablet 11   Blood Glucose Monitoring Suppl (ACCU-CHEK GUIDE ME) w/Device KIT Use to check blood sugar daily 1 kit 0   calcium carbonate (OS-CAL) 600 MG TABS tablet Take 600 mg by mouth 2 (two) times daily with a meal.     clotrimazole (LOTRIMIN) 1 % cream Apply topically under breasts daily PRN 30 g 1   Continuous Glucose Receiver (FREESTYLE LIBRE 14 DAY READER) DEVI UAD to check sugars.  E11.9 1 each 0   Continuous Glucose Receiver (FREESTYLE LIBRE 3 READER) DEVI 1 Units by Does not apply route as needed. 1 each 0   Continuous Glucose Sensor (FREESTYLE LIBRE 14 DAY SENSOR) MISC UAD to check sugars.  E11.9 2 each 5   Continuous Glucose Sensor (FREESTYLE LIBRE 3 SENSOR) MISC Inject 1 Units into the skin as directed. Place 1 sensor on the skin every 14 days. Use to check glucose continuously 2 each 4   dicyclomine (BENTYL) 10 MG capsule TAKE 1 TO 2 CAPSULES BY MOUTH TWICE DAILY AS NEEDED FOR SPASMS. 360 capsule 1   DULoxetine (CYMBALTA) 60 MG capsule TAKE ONE CAPSULE BY MOUTH DAILY AT 9AM 90 capsule 11   Elastic Bandages & Supports (MEDICAL COMPRESSION SOCKS) MISC Compression sock, medium compression 20-30 mmHg. Use daily for venous insufficiency I87.2 3 each 0    fluticasone (FLONASE) 50 MCG/ACT nasal spray Place 2 sprays into both nostrils daily. 16 g 11   folic acid (FOLVITE) 1 MG tablet TAKE ONE TABLET BY MOUTH DAILY AT 9AM 240 tablet 11   furosemide (LASIX) 20 MG tablet TAKE ONE TABLET BY MOUTH DAILY AT 9AM 60 tablet 11   glucose blood (ACCU-CHEK GUIDE) test strip Use to check sugars once daily. Dx Code-R73.03 100 each 1   hydrocortisone-pramoxine (ANALPRAM HC SINGLES) 2.5-1 % rectal cream Place 1 Application rectally 3 (three) times daily. 30 g 1   Incontinence Supply Disposable (COMFORT PROTECT ADULT DIAPER/L) MISC Use as directed for urinary incontinence 90 each 5   Insulin Pen Needle 32G X 4 MM MISC Use to administer Ozempic once a week 30 each 0   LINZESS 145 MCG CAPS capsule TAKE ONE CAPSULE BY MOUTH DAILY BEFORE BREAKFAST **PLEASE CALL OFFICE TO SCHEDULE FOLLOW UP (ORIG) 90 capsule 11   Olopatadine HCl 0.2 % SOLN INSTILL 1 DROP INTO EACH EYE ONCE DAILY     omeprazole (PRILOSEC) 20 MG capsule Take 1 capsule (20 mg total) by mouth daily. 30 capsule 5   OZEMPIC, 1 MG/DOSE, 4 MG/3ML SOPN INJECT 1 MG  SUBCUTANEOUSLY ONCE A WEEK 3 mL 0   polyethylene glycol powder (GLYCOLAX/MIRALAX)  17 GM/SCOOP powder Take 17 g by mouth daily as needed for moderate constipation. 255 g 11   propranolol (INDERAL) 80 MG tablet TAKE ONE TABLET BY MOUTH TWICE DAILY @9AM -5PM 180 tablet 1   rivastigmine (EXELON) 6 MG capsule Take 1 capsule (6 mg total) by mouth 2 (two) times daily. 60 capsule 6   Suvorexant (BELSOMRA) 10 MG TABS TAKE ONE TABLET (10 MG TOTAL) BY MOUTH AT BEDTIME AS NEEDED 30 tablet 0   telmisartan (MICARDIS) 80 MG tablet TAKE ONE TABLET BY MOUTH DAILY AT 9AM (ORIG) 60 tablet 11   traZODone (DESYREL) 50 MG tablet Take 100 mg by mouth at bedtime.     triamcinolone cream (KENALOG) 0.1 % Apply 1 Application topically 2 (two) times daily. Use for up to 14 days at a time on rash 30 g 0   No current facility-administered medications on file prior to visit.      Review of Systems  Constitutional:  Negative for fever.  Respiratory:  Positive for cough (mucus in throat). Negative for shortness of breath and wheezing.   Cardiovascular:  Negative for chest pain, palpitations and leg swelling.  Gastrointestinal:  Positive for abdominal pain (epigastric pressure at times- when she drinks) and constipation.       Occ gerd with certain foods  Neurological:  Positive for dizziness. Negative for headaches.  Psychiatric/Behavioral:  Positive for dysphoric mood and sleep disturbance (can not get to sleep). The patient is nervous/anxious.        Objective:   Vitals:   07/29/23 1353  BP: 112/74  Pulse: 67  Temp: 98 F (36.7 C)  SpO2: 98%   BP Readings from Last 3 Encounters:  07/29/23 112/74  03/27/23 110/74  02/04/23 112/76   Wt Readings from Last 3 Encounters:  07/29/23 208 lb (94.3 kg)  03/27/23 222 lb (100.7 kg)  02/04/23 220 lb (99.8 kg)   Body mass index is 38.04 kg/m.    Physical Exam Constitutional:      General: She is not in acute distress.    Appearance: Normal appearance.  HENT:     Head: Normocephalic and atraumatic.  Eyes:     Conjunctiva/sclera: Conjunctivae normal.  Cardiovascular:     Rate and Rhythm: Normal rate and regular rhythm.     Heart sounds: Normal heart sounds.  Pulmonary:     Effort: Pulmonary effort is normal. No respiratory distress.     Breath sounds: Normal breath sounds. No wheezing.  Musculoskeletal:     Cervical back: Neck supple.     Right lower leg: No edema.     Left lower leg: No edema.  Lymphadenopathy:     Cervical: No cervical adenopathy.  Skin:    General: Skin is warm and dry.     Findings: No rash.  Neurological:     Mental Status: She is alert. Mental status is at baseline.  Psychiatric:        Mood and Affect: Mood normal.        Behavior: Behavior normal.        Lab Results  Component Value Date   WBC 7.5 12/21/2022   HGB 13.1 12/21/2022   HCT 40.5 12/21/2022    PLT 138.0 (L) 12/21/2022   GLUCOSE 91 12/21/2022   CHOL 144 12/21/2022   TRIG 104.0 12/21/2022   HDL 51.90 12/21/2022   LDLDIRECT 143.8 07/01/2012   LDLCALC 71 12/21/2022   ALT 17 12/21/2022   AST 19 12/21/2022   NA 142 12/21/2022  K 3.5 12/21/2022   CL 106 12/21/2022   CREATININE 0.92 12/21/2022   BUN 14 12/21/2022   CO2 32 12/21/2022   TSH 2.46 04/21/2021   INR 0.91 02/08/2011   HGBA1C 5.5 12/21/2022   MICROALBUR 0.8 04/09/2022     Assessment & Plan:    See Problem List for Assessment and Plan of chronic medical problems.

## 2023-07-29 ENCOUNTER — Telehealth: Payer: Self-pay | Admitting: Neurology

## 2023-07-29 ENCOUNTER — Telehealth: Payer: Self-pay

## 2023-07-29 ENCOUNTER — Ambulatory Visit (INDEPENDENT_AMBULATORY_CARE_PROVIDER_SITE_OTHER): Payer: Medicare PPO | Admitting: Internal Medicine

## 2023-07-29 VITALS — BP 112/74 | HR 67 | Temp 98.0°F | Ht 62.0 in | Wt 208.0 lb

## 2023-07-29 DIAGNOSIS — G3183 Dementia with Lewy bodies: Secondary | ICD-10-CM

## 2023-07-29 DIAGNOSIS — E782 Mixed hyperlipidemia: Secondary | ICD-10-CM | POA: Diagnosis not present

## 2023-07-29 DIAGNOSIS — K5909 Other constipation: Secondary | ICD-10-CM

## 2023-07-29 DIAGNOSIS — R600 Localized edema: Secondary | ICD-10-CM | POA: Diagnosis not present

## 2023-07-29 DIAGNOSIS — R32 Unspecified urinary incontinence: Secondary | ICD-10-CM | POA: Diagnosis not present

## 2023-07-29 DIAGNOSIS — F411 Generalized anxiety disorder: Secondary | ICD-10-CM

## 2023-07-29 DIAGNOSIS — E119 Type 2 diabetes mellitus without complications: Secondary | ICD-10-CM | POA: Diagnosis not present

## 2023-07-29 DIAGNOSIS — G4700 Insomnia, unspecified: Secondary | ICD-10-CM

## 2023-07-29 DIAGNOSIS — I10 Essential (primary) hypertension: Secondary | ICD-10-CM | POA: Diagnosis not present

## 2023-07-29 DIAGNOSIS — K219 Gastro-esophageal reflux disease without esophagitis: Secondary | ICD-10-CM | POA: Diagnosis not present

## 2023-07-29 DIAGNOSIS — Z7985 Long-term (current) use of injectable non-insulin antidiabetic drugs: Secondary | ICD-10-CM

## 2023-07-29 DIAGNOSIS — F02A3 Dementia in other diseases classified elsewhere, mild, with mood disturbance: Secondary | ICD-10-CM

## 2023-07-29 LAB — CBC WITH DIFFERENTIAL/PLATELET
Basophils Absolute: 0 10*3/uL (ref 0.0–0.1)
Basophils Relative: 0.4 % (ref 0.0–3.0)
Eosinophils Absolute: 0.1 10*3/uL (ref 0.0–0.7)
Eosinophils Relative: 1.2 % (ref 0.0–5.0)
HCT: 41.1 % (ref 36.0–46.0)
Hemoglobin: 13.4 g/dL (ref 12.0–15.0)
Lymphocytes Relative: 17.7 % (ref 12.0–46.0)
Lymphs Abs: 1.6 10*3/uL (ref 0.7–4.0)
MCHC: 32.6 g/dL (ref 30.0–36.0)
MCV: 98.4 fL (ref 78.0–100.0)
Monocytes Absolute: 0.7 10*3/uL (ref 0.1–1.0)
Monocytes Relative: 7.1 % (ref 3.0–12.0)
Neutro Abs: 6.7 10*3/uL (ref 1.4–7.7)
Neutrophils Relative %: 73.6 % (ref 43.0–77.0)
Platelets: 135 10*3/uL — ABNORMAL LOW (ref 150.0–400.0)
RBC: 4.17 Mil/uL (ref 3.87–5.11)
RDW: 14.4 % (ref 11.5–15.5)
WBC: 9.2 10*3/uL (ref 4.0–10.5)

## 2023-07-29 LAB — COMPREHENSIVE METABOLIC PANEL
ALT: 12 U/L (ref 0–35)
AST: 16 U/L (ref 0–37)
Albumin: 3.8 g/dL (ref 3.5–5.2)
Alkaline Phosphatase: 91 U/L (ref 39–117)
BUN: 9 mg/dL (ref 6–23)
CO2: 29 meq/L (ref 19–32)
Calcium: 10 mg/dL (ref 8.4–10.5)
Chloride: 107 meq/L (ref 96–112)
Creatinine, Ser: 0.96 mg/dL (ref 0.40–1.20)
GFR: 61 mL/min (ref >60.00)
Glucose, Bld: 92 mg/dL (ref 70–99)
Potassium: 3.9 meq/L (ref 3.5–5.1)
Sodium: 141 meq/L (ref 135–145)
Total Bilirubin: 0.4 mg/dL (ref 0.2–1.2)
Total Protein: 6.9 g/dL (ref 6.0–8.3)

## 2023-07-29 LAB — LIPID PANEL
Cholesterol: 150 mg/dL (ref 0–200)
HDL: 49.6 mg/dL (ref >39.00)
LDL Cholesterol: 79 mg/dL (ref 0–99)
NonHDL: 100.56
Total CHOL/HDL Ratio: 3
Triglycerides: 110 mg/dL (ref 0.0–149.0)
VLDL: 22 mg/dL (ref 0.0–40.0)

## 2023-07-29 LAB — HEMOGLOBIN A1C: Hgb A1c MFr Bld: 5.4 % (ref 4.6–6.5)

## 2023-07-29 MED ORDER — LINACLOTIDE 72 MCG PO CAPS: 72.0000 ug | ORAL_CAPSULE | Freq: Every day | ORAL | 1 refills | Status: DC

## 2023-07-29 MED ORDER — PROPRANOLOL HCL 40 MG PO TABS: 40.0000 mg | ORAL_TABLET | Freq: Two times a day (BID) | ORAL | 5 refills | Status: DC

## 2023-07-29 MED ORDER — BELSOMRA 10 MG PO TABS: ORAL_TABLET | ORAL | 0 refills | Status: DC

## 2023-07-29 MED ORDER — FLUTICASONE PROPIONATE 50 MCG/ACT NA SUSP: 2.0000 | Freq: Every day | NASAL | 11 refills | Status: DC

## 2023-07-29 NOTE — Assessment & Plan Note (Addendum)
Chronic Difficulty falling asleep and staying asleep Trazodone, Sonata not effective Continue to avoid caffeine and go to bed at the same time Avoid naps during the day unless they are very short Currently taking Belsomra 10 mg nightly-advised to continue this-can increase if needed, but discussed potential side effects of being on too much medication Stressed better sleep hygiene

## 2023-07-29 NOTE — Assessment & Plan Note (Signed)
Chronic Following with Dr. Everlena Cooper Currently on Cymbalta 60 mg daily-continue Currently taking exelon 6 mg bid

## 2023-07-29 NOTE — Assessment & Plan Note (Signed)
Chronic improved Continue omeprazole 20 mg daily

## 2023-07-29 NOTE — Assessment & Plan Note (Signed)
Chronic controlled Continue Cymbalta 60 mg daily

## 2023-07-29 NOTE — Assessment & Plan Note (Signed)
Chronic Lab Results  Component Value Date   HGBA1C 5.5 12/21/2022   Check A1c, urine microalbumin Sugars have been controlled  Continue Ozempic 1 mg weekly-discussed with her and her family that I do not think we should increase this dose because of increased risk of side effects especially constipation which she already has Encouraged regular exercise

## 2023-07-29 NOTE — Assessment & Plan Note (Addendum)
Chronic States increased urinary incontinence-she is not making it to the bathroom in time Using adult diapers-approximately 4/day Will do prescription through insurance

## 2023-07-29 NOTE — Telephone Encounter (Signed)
[facility] pharmacy called and LM with AN. Pt needs a refill on a medication but did not state which medication.   [facility] [phone]

## 2023-07-29 NOTE — Assessment & Plan Note (Signed)
Chronic Blood pressure well controlled Having some lightheadedness when she stands up-possibly orthostatic hypotension CMP, cbc Decrease propranolol to 40 mg twice daily Continue telmisartan 80 mg daily

## 2023-07-29 NOTE — Assessment & Plan Note (Signed)
Chronic Has had constipation her whole life Currently on Linzess-145 mcg daily which she states is too strong decrease Linzess to 72 mcg daily Encouraged moving around more, drinking a good amount of water Discussed that Ozempic can make this worse and that is another reason I do not think we should increase the dose

## 2023-07-29 NOTE — Assessment & Plan Note (Signed)
Chronic Stable-no edema on exam Continue Lasix 20 mg daily

## 2023-07-29 NOTE — Assessment & Plan Note (Signed)
 Chronic Regular exercise and healthy diet encouraged Check lipid panel, cmp Continue atorvastatin 10 mg daily

## 2023-07-30 ENCOUNTER — Other Ambulatory Visit (HOSPITAL_COMMUNITY): Payer: Self-pay

## 2023-07-30 ENCOUNTER — Telehealth: Payer: Self-pay

## 2023-07-30 ENCOUNTER — Encounter: Payer: Self-pay | Admitting: Internal Medicine

## 2023-07-30 LAB — MICROALBUMIN / CREATININE URINE RATIO
Creatinine,U: 363.6 mg/dL
Microalb Creat Ratio: 1 mg/g (ref 0.0–30.0)
Microalb, Ur: 3.6 mg/dL — ABNORMAL HIGH (ref 0.0–1.9)

## 2023-07-30 NOTE — Telephone Encounter (Signed)
 Tried calling-no answer.

## 2023-07-30 NOTE — Telephone Encounter (Signed)
Pharmacy Patient Advocate Encounter  Received notification from [facility] that Prior Authorization for Ozempic 4mg /18ml has been CANCELLED due to an authorization already on file for the request. Authorization starting on 06/26/2023 and ending 06/24/2024.

## 2023-07-30 NOTE — Telephone Encounter (Signed)
Pharmacy Patient Advocate Encounter   Received notification from Pt Calls Messages that prior authorization for Ozempic 4mg /61ml is required/requested.   Insurance verification completed.   The patient is insured through [employer] .   Per test claim: PA required; PA submitted to above mentioned insurance via CoverMyMeds Key/confirmation #/EOC [id number] Status is pending

## 2023-07-31 ENCOUNTER — Telehealth: Payer: Self-pay | Admitting: Internal Medicine

## 2023-07-31 NOTE — Telephone Encounter (Signed)
 Copied from CRM [id number]. Topic: Clinical - Medication Question >> Jul 31, 2023  3:53 PM Suzette B wrote: Reason for CRM: Patient daughter Eleanor stated an increase of the sleep medication is needed; however, when it was called in to the pharmacy it was only shown at the 10mg  dosage, pharmacy explained that it cannot be increased until the 25th due to this being a new refill on yesterday.  Patien'ts daughter stated that she needs to speak to someone in the office read her what the provider not said about too much sleep medication but she is still wanting to speak with someone about the increase

## 2023-08-01 ENCOUNTER — Other Ambulatory Visit: Payer: Self-pay

## 2023-08-01 MED ORDER — BELSOMRA 15 MG PO TABS: 15.0000 mg | ORAL_TABLET | Freq: Every evening | ORAL | 0 refills | Status: DC | PRN

## 2023-08-01 MED ORDER — FLUTICASONE PROPIONATE 50 MCG/ACT NA SUSP: 2.0000 | Freq: Every day | NASAL | 11 refills | Status: AC

## 2023-08-01 NOTE — Telephone Encounter (Signed)
 Spoke with patient today.

## 2023-08-01 NOTE — Telephone Encounter (Signed)
 Belsomra  15 mg sent to [facility]

## 2023-08-06 NOTE — Progress Notes (Deleted)
 NEUROLOGY FOLLOW UP OFFICE NOTE  ELIZEBETH KLUESNER [id number]  Assessment/Plan:   Lewy body dementia - as this remains most likely explanation based on repeat neuropsych testing, will not pursue skin biopsy Insomnia    Rivastigmine *** *** Advised to continue daily activities but should have somebody monitor. Continue to refrain from driving Follow up with me in 6 months ***    Total time spent in chart and face to face with patient and daughter *** discussing diagnosis and management and reviewing chart:  ***   Subjective:  Garnette Dethlefs is a 68 year old right-handed woman with hypertension, pre-diabetes, TB, IBS, lymphedema, osteoarthritis and osteoporosis and history of renal cell carcinoma who follows up for Lewy body dementia.  She is accompanied by her daughter (and another daughter via phone) who supplement histor.    UPDATE: Current medications:  rivastigmine 7.5mg  (3mg  and 4.5mg )  twice daily, duloxetine 60mg , trazodone 50mg  QHS, propranolol 80mg  BID, telmisartan,  Lasix  Overall stable.  Still with some hallucinations but table overall  She had one severe episode 3 months ago in which she was agitated and was up all night.  She thought she was talking to her sister and her daughter woke up and found her standing at the front door.  She is repeating herself frequently.  She has had some near falls but no actual falls.  She uses her cane outside but doesn't always use it in the house.  May also use her walker outside as well.      Started trazodone 50mg  at bedtime by her PCP to help with sleep.  Ineffective.  She tried 100mg  which was more effective.    She has been prescribed rivastigmine 6mg  twice daily.  Her pharmacy has been giving her 3mg  twice daily and 4.5mg  twice daily.  Unsure why.    HISTORY: I.  LEWY BODY DEMENTIA: In 2014, she started experiencing visual disturbance.  She describes initially seeing a line of light along the bottom of her visual field in the  left eye.  She then developed what looks like bubbles floating up in the temporal aspect of the visual fields of in both eyes.  It lasts only a few seconds and occurs several times a day.  She also began experiencing dizziness and lightheadedness and was found to have orthostatic hypotension.  In 2018, she began noticing memory problems.  Labs from May 2019 included normal B12 528 and TSH 1.37.  She has been treated for latent TB.   Around this time, she began experiencing tremors in the hands and occasional jerks of her extremities.   She underwent neuropsychological testing 07/09/18.  She demonstrated probable deficits of processing speed, executive function, visual memory significantly more than verbal memory, and Theatre stage manager.  It was noted that such a profile may be seen in early Parkinson's disease or a Parkinson's plus syndrome.  However, her performance may have been skewed due to observed anxiety and mildly suboptimal task persistence.  In March 2022, she was confused and hallucinating.  She was looking out of the house at her car and looked like there was somebody in her car.  When she went out to her car, nobody was there.  She started seeing people, strangers, not there in her house. She said that they were coming in through the walls.  She may quickly think she sees something (like a figure) and when she turns her head to that direction, it is gone.    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  She underwent repeat neuropsychological evaluation on 01/25/2021.  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  Given her history of gait instability, hallucinations, and tremor, Lewy body dementia suspected. MRI of brain on 68/27/2015 showed small 11 mm left frontal dural calcification vs meningioma at the left vertex.  MRI of brain on 10/05/2020 which showed stable 11 mm left frontal  convexity lesion (probable meningioma), minimal chronic small vessel disease.  Insurance wouldn't approve DaT scan.  Repeat neuropsychological evaluation on 01/14/2023 demonstrated cogntive decline across all domains, especially executive functioning and aspects of learning and memory, still concerning for Lewy body disease.    Past medications:  quetiapine (increased hallucinations and agitation), trazodone   II.  NEUROPATHY:  In 2016, she started experiencing burning pain from the bottom of her feet up the back of her legs to the knees.  It is intermittent and lasts for various and unknown period of time, but not aware of any triggers such as due to position.  In addition, she is reporting increased back pain and spasms, worse when sitting and improved when laying supine.  She has a history of chronic low back pain with left lumbar radiculopathy, as well as right knee pain.  She has been seen and evaluated by orthopedics and pain management.  MRI of lumbar spine from 07/27/16 revealed advanced L4-5 and L5-S1 facet arthrosis with some bilateral neural foraminal stenosis.  Dr. August Saucer of orthopedics did not think surgery was an option at that time.  TSH from 10/15/16 was 1.67.  B12 from 10/18/16 was 537.  Hgb A1c from 04/05/17 was 5.6.  NCV-EMG from 10/25/16 demonstrated no evidence of large fiber sensorimotor polyneuropathy or lumbosacral radiculopathy.  MRI of brain and cervical spine on 10/05/2020 which showed stable 11 mm left frontal convexity meningioma, minimal chronic small vessel disease, moderate cervical spondylosis from C4 through C7 and small central disc protrusion at C7-T1 but no acute intracranial abnormality or spinal stenosis or cord abnormality.   Labs from 11/02/2020 include negative ANA, sed rate 16, ACE 23, SPEP/IFE negative for M-spike.  NCV-EMG on 11/22/2020 now demonstrated chronic sensorimotor axonal polyneuropathy.    PAST MEDICAL HISTORY: Past Medical History:  Diagnosis Date   Allergic  rhinitis 08/31/2009   Arthralgia of hip 11/14/2017   Attention deficit hyperactivity disorder 06/06/2020   diagnosed as adult w/o formal testing   Bilateral leg edema 06/12/2018   Blepharitis 11/07/2019   Blood dyscrasia    platelets low in past   Breast asymmetry 04/08/2021   Cataract    Cervical radiculopathy 09/07/2020   Cervicalgia 05/30/2016   Chronic low back pain 07/18/2016   Degeneration of lumbar intervertebral disc 01/09/2018   Dyspnea on exertion 05/11/2019   Endometriosis    Esophageal stricture    Essential hypertension 05/06/2009   Excessive daytime sleepiness 05/11/2019   External hemorrhoids 08/28/2010   Fatigue 11/14/2017   Generalized anxiety disorder 10/08/2017   GERD (gastroesophageal reflux disease)    Glaucoma 06/06/2020   Gout 12/11/2013   Hiatal hernia    History of positive PPD 04/09/2022   History of latent TB-completed treatment     Hyperlipidemia 03/16/2010   Hypertensive retinopathy of both eyes 10/06/2019   IBS (irritable bowel syndrome) with chronic constipation 11/14/2010   Insomnia 12/21/2022   Iridocyclitis 10/06/2019   Laryngopharyngeal reflux (LPR) 07/03/2018   Lattice degeneration of both retinas 10/06/2019   Left  lumbar radiculopathy 04/13/2012   Lewy body dementia 01/14/2023   Dr Everlena Cooper     LVH (left ventricular hypertrophy), mild 05/18/2017   Echo 04/2017 - mild LVH, normal EF, Grade 1 DD   Lymphedema    Major depressive disorder 10/08/2017   Migraine 06/14/2009   Morbid obesity 11/06/2013   Neuropathy 10/07/2017   Nocturnal hypoxemia 05/11/2019   Osteopenia 11/18/2015   06/10/2018: LFN -1.1, left radius 0.7, low FRAX.  No significant change from 2017  dexa 10/2015: t score  Spine -1.3, dual femur -0.9  Took alendronate 2014-2019   Pain in left knee 09/08/2014   Post traumatic stress disorder (PTSD)    Primary osteoarthritis involving multiple joints 03/10/2019   Bilateral hips, knees   Pseudophakia of both eyes 10/06/2019    Pure hypercholesterolemia 03/16/2010   REM sleep behaviors    Renal cell carcinoma    R cryoablation by IR 02/16/11, Dr. Fredia Sorrow  Followed by Dr. Claire Shown  No evidence of residual disease on CT 12/13 and 12/14   Rib pain on left side 01/03/2021   S/P knee surgery 10/30/2016   Snoring 04/09/2022   Spondylolisthesis of lumbar region 01/09/2018   Syncope and collapse 05/16/2009   since childhood; associated with extreme heat   TB lung, latent 11/25/2019   Thrombocytopenia 05/30/2015   Saw hem 02/2017 - mild, chronic - advised to stop protonix, no concerning cause, plan for pcp to monitor   Tinnitus of right ear 07/03/2018   Type 2 diabetes mellitus without complication, without long-term current use of insulin 10/06/2019   Urinary incontinence 07/03/2021   Venous insufficiency of both lower extremities 04/06/2021   Visual hallucinations    Vulvar itching 01/03/2021    MEDICATIONS: Current Outpatient Medications on File Prior to Visit  Medication Sig Dispense Refill   Accu-Chek FastClix Lancets MISC USE AS DIRECTED UP  TO  1 TIME  DAILY- DX CODE R73.03 100 each 1   acetaminophen (TYLENOL) 325 MG tablet Take 1,300 mg by mouth at bedtime.     atorvastatin (LIPITOR) 10 MG tablet TAKE ONE TABLET BY MOUTH DAILY AT 5PM 90 tablet 11   Blood Glucose Monitoring Suppl (ACCU-CHEK GUIDE ME) w/Device KIT Use to check blood sugar daily 1 kit 0   calcium carbonate (OS-CAL) 600 MG TABS tablet Take 600 mg by mouth 2 (two) times daily with a meal.     clotrimazole (LOTRIMIN) 1 % cream Apply topically under breasts daily PRN 30 g 1   DULoxetine (CYMBALTA) 60 MG capsule TAKE ONE CAPSULE BY MOUTH DAILY AT 9AM 90 capsule 11   Elastic Bandages & Supports (MEDICAL COMPRESSION SOCKS) MISC Compression sock, medium compression 20-30 mmHg. Use daily for venous insufficiency I87.2 3 each 0   fluticasone (FLONASE) 50 MCG/ACT nasal spray Place 2 sprays into both nostrils daily. 16 g 11   folic acid (FOLVITE) 1 MG  tablet TAKE ONE TABLET BY MOUTH DAILY AT 9AM 240 tablet 11   furosemide (LASIX) 20 MG tablet TAKE ONE TABLET BY MOUTH DAILY AT 9AM 60 tablet 11   glucose blood (ACCU-CHEK GUIDE) test strip Use to check sugars once daily. Dx Code-R73.03 100 each 1   hydrocortisone-pramoxine (ANALPRAM HC SINGLES) 2.5-1 % rectal cream Place 1 Application rectally 3 (three) times daily. 30 g 1   Incontinence Supply Disposable (COMFORT PROTECT ADULT DIAPER/L) MISC Use as directed for urinary incontinence 90 each 5   linaclotide (LINZESS) 72 MCG capsule Take 1 capsule (72 mcg total) by mouth daily before  breakfast. For constipation 90 capsule 1   Olopatadine HCl 0.2 % SOLN INSTILL 1 DROP INTO EACH EYE ONCE DAILY     omeprazole (PRILOSEC) 20 MG capsule Take 1 capsule (20 mg total) by mouth daily. 30 capsule 5   OZEMPIC, 1 MG/DOSE, 4 MG/3ML SOPN INJECT 1 MG  SUBCUTANEOUSLY ONCE A WEEK 3 mL 0   polyethylene glycol powder (GLYCOLAX/MIRALAX) 17 GM/SCOOP powder Take 17 g by mouth daily as needed for moderate constipation. 255 g 11   propranolol (INDERAL) 40 MG tablet Take 1 tablet (40 mg total) by mouth 2 (two) times daily. 60 tablet 5   rivastigmine (EXELON) 6 MG capsule Take 1 capsule (6 mg total) by mouth 2 (two) times daily. 60 capsule 6   Suvorexant (BELSOMRA) 15 MG TABS Take 1 tablet (15 mg total) by mouth at bedtime as needed. 30 tablet 0   telmisartan (MICARDIS) 80 MG tablet TAKE ONE TABLET BY MOUTH DAILY AT 9AM (ORIG) 60 tablet 11   triamcinolone cream (KENALOG) 0.1 % Apply 1 Application topically 2 (two) times daily. Use for up to 14 days at a time on rash 30 g 0   No current facility-administered medications on file prior to visit.    ALLERGIES: Allergies  Allergen Reactions   Sertraline Hcl Other (See Comments)    Spasms; numbness   Ace Inhibitors Cough   Codeine Palpitations   Darifenacin Hydrobromide Er Other (See Comments)    Pt states made her feel queezy & drunk   Gabapentin Other (See Comments)     Hallucinations   Pravastatin Other (See Comments)    myalgias   Propoxyphene Hcl Palpitations   Seroquel [Quetiapine] Other (See Comments)    Woke up screaming and yelling after taking it   Wellbutrin [Bupropion Hcl] Other (See Comments)    Numbness of mouth/lips    FAMILY HISTORY: Family History  Problem Relation Age of Onset   Diabetes Mother    Hypertension Mother    Hyperlipidemia Mother    Heart disease Mother    Stroke Mother    Kidney disease Mother    Thyroid disease Mother    Sleep apnea Mother    Obesity Mother    Memory loss Mother    Kidney disease Father    Prostate cancer Father    Alcoholism Father    Alcohol abuse Father    Multiple sclerosis Sister    Colitis Brother    Alcohol abuse Brother    Leukemia Brother    Alcohol abuse Daughter    Breast cancer Maternal Aunt    Breast cancer Cousin    Cancer Other    Asthma Other    Colon polyps Other    Colon cancer Neg Hx    Esophageal cancer Neg Hx    Pancreatic cancer Neg Hx    Rectal cancer Neg Hx    Stomach cancer Neg Hx       Objective:  *** General: No acute distress.  Patient appears well-groomed.   Head:  Normocephalic/atraumatic Eyes:  Fundi examined but not visualized Neck: supple, no paraspinal tenderness, full range of motion Heart:  Regular rate and rhythm Neurological Exam: alert and oriented to person, place, and time.  Speech fluent and not dysarthric, language intact.  CN II-XII intact. Bulk and tone normal, no rigidity, no bradykinesia, no tremor, muscle strength 5/5 throughout.  Sensation to light touch intact.  Deep tendon reflexes 1+ throughout.  Finger to nose testing intact.  Mildly broad-based gait.  Uses cane.  ***   Shon Millet, DO  CC: Cheryll Cockayne, MD

## 2023-08-07 ENCOUNTER — Ambulatory Visit: Payer: Medicare HMO | Admitting: Neurology

## 2023-08-08 ENCOUNTER — Ambulatory Visit (INDEPENDENT_AMBULATORY_CARE_PROVIDER_SITE_OTHER): Payer: Medicare HMO | Admitting: Neurology

## 2023-08-08 ENCOUNTER — Encounter: Payer: Self-pay | Admitting: Neurology

## 2023-08-08 VITALS — BP 103/76 | HR 96 | Ht 62.0 in | Wt 204.0 lb

## 2023-08-08 DIAGNOSIS — G3183 Dementia with Lewy bodies: Secondary | ICD-10-CM | POA: Diagnosis not present

## 2023-08-08 DIAGNOSIS — F02A3 Dementia in other diseases classified elsewhere, mild, with mood disturbance: Secondary | ICD-10-CM | POA: Diagnosis not present

## 2023-08-08 DIAGNOSIS — F411 Generalized anxiety disorder: Secondary | ICD-10-CM

## 2023-08-08 MED ORDER — RIVASTIGMINE TARTRATE 6 MG PO CAPS: 6.0000 mg | ORAL_CAPSULE | Freq: Two times a day (BID) | ORAL | 5 refills | Status: DC

## 2023-08-08 MED ORDER — CITALOPRAM HYDROBROMIDE 10 MG PO TABS
10.0000 mg | ORAL_TABLET | Freq: Every day | ORAL | 5 refills | Status: DC
Start: 2023-08-08 — End: 2023-09-16

## 2023-08-08 NOTE — Patient Instructions (Signed)
For depression/anxiety, start citalopram 10mg  daily Continue rivastigmine 6mg  twice daily Home Health referral Follow up 6 months.

## 2023-08-08 NOTE — Progress Notes (Unsigned)
NEUROLOGY FOLLOW UP OFFICE NOTE  Janet Le [id number]  Assessment/Plan:   Lewy body dementia, mild - as this remains most likely explanation based on repeat neuropsych testing, will not pursue skin biopsy Insomnia    Rivastigmine 6mg  twice daily To address anxiety, start citalopram 10mg  daily.  Patient also requests referral for therapist Home health service Refrain from driving Follow up with me in 6 months     Total time spent in chart and face to face with patient and daughter  discussing diagnosis and management and reviewing chart:  40 minutes   Subjective:  Janet Le is a 68 year old right-handed woman with hypertension, pre-diabetes, TB, IBS, lymphedema, osteoarthritis and osteoporosis and history of renal cell carcinoma who follows up for Lewy body dementia.  She is accompanied by her daughter (and another daughter via phone) who supplement histor.    UPDATE: Current medications:  rivastigmine 6mg  twice daily, Belsomra 15mg  at bedtime, propranolol 80mg  BID, telmisartan,  Lasix  Continues to live in her own home with her grandson who manages her medications.  Continues to have insomnia.  Currently on Belsomra by her PCP.  Has had some improvement.  If she has poor sleep, she may be more irritable or exhibit more hallucinations the next day.  Hallucinations are infrequent.  Usually sees a shadow in the corner of her eye for a few moments.  Anxiety is Le issue.  She has had more falls at home.  Not using the cane when she falls.    Lives with grandson in her home - he manages her meds -   insomnia - did not trazodone ineffective now on Belsomra - increase to 15mg  - takes 2 hours to fall asleep but able to stay asleep.  Not enough sleep - more irritable and hallucinating  - usually gets 10 hours  -  Hallucinations once in awhile shadows,    HISTORY: I.  LEWY BODY DEMENTIA: In 2014, she started experiencing visual disturbance.  She describes initially seeing a line  of light along the bottom of her visual field in the left eye.  She then developed what looks like bubbles floating up in the temporal aspect of the visual fields of in both eyes.  It lasts only a few seconds and occurs several times a day.  She also began experiencing dizziness and lightheadedness and was found to have orthostatic hypotension.  In 2018, she began noticing memory problems.  Labs from May 2019 included normal B12 528 and TSH 1.37.  She has been treated for latent TB.   Around this time, she began experiencing tremors in the hands and occasional jerks of her extremities.   She underwent neuropsychological testing 07/09/18.  She demonstrated probable deficits of processing speed, executive function, visual memory significantly more than verbal memory, and Theatre stage manager.  It was noted that such a profile may be seen in early Parkinson's disease or a Parkinson's plus syndrome.  However, her performance may have been skewed due to observed anxiety and mildly suboptimal task persistence.  In March 2022, she was confused and hallucinating.  She was looking out of the house at her car and looked like there was somebody in her car.  When she went out to her car, nobody was there.  She started seeing people, strangers, not there in her house. She said that they were coming in through the walls.  She may quickly think she sees something (like a figure) and when she turns her head to that  direction, it is gone.    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  She underwent repeat neuropsychological evaluation on 01/25/2021.  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  Given her history of gait instability, hallucinations, and tremor, Lewy body dementia suspected. MRI of brain on 12/19/2013 showed small 11 mm left frontal dural calcification vs meningioma at the left vertex.  MRI of brain on  10/05/2020 which showed stable 11 mm left frontal convexity lesion (probable meningioma), minimal chronic small vessel disease.  Insurance wouldn't approve DaT scan.  Repeat neuropsychological evaluation on 01/14/2023 demonstrated cogntive decline across all domains, especially executive functioning and aspects of learning and memory, still concerning for Lewy body disease.    Past medications:  quetiapine (increased hallucinations and agitation), trazodone (ineffective, excessive daytime drowsiness), duloxetine   II.  NEUROPATHY:  In 2016, she started experiencing burning pain from the bottom of her feet up the back of her legs to the knees.  It is intermittent and lasts for various and unknown period of time, but not aware of any triggers such as due to position.  In addition, she is reporting increased back pain and spasms, worse when sitting and improved when laying supine.  She has a history of chronic low back pain with left lumbar radiculopathy, as well as right knee pain.  She has been seen and evaluated by orthopedics and pain management.  MRI of lumbar spine from 07/27/16 revealed advanced L4-5 and L5-S1 facet arthrosis with some bilateral neural foraminal stenosis.  Dr. August Saucer of orthopedics did not think surgery was Le option at that time.  TSH from 10/15/16 was 1.67.  B12 from 10/18/16 was 537.  Hgb A1c from 04/05/17 was 5.6.  NCV-EMG from 10/25/16 demonstrated no evidence of large fiber sensorimotor polyneuropathy or lumbosacral radiculopathy.  MRI of brain and cervical spine on 10/05/2020 which showed stable 11 mm left frontal convexity meningioma, minimal chronic small vessel disease, moderate cervical spondylosis from C4 through C7 and small central disc protrusion at C7-T1 but no acute intracranial abnormality or spinal stenosis or cord abnormality.   Labs from 11/02/2020 include negative ANA, sed rate 16, ACE 23, SPEP/IFE negative for M-spike.  NCV-EMG on 11/22/2020 now demonstrated chronic sensorimotor  axonal polyneuropathy.    PAST MEDICAL HISTORY: Past Medical History:  Diagnosis Date   Allergic rhinitis 08/31/2009   Arthralgia of hip 11/14/2017   Attention deficit hyperactivity disorder 06/06/2020   diagnosed as adult w/o formal testing   Bilateral leg edema 06/12/2018   Blepharitis 11/07/2019   Blood dyscrasia    platelets low in past   Breast asymmetry 04/08/2021   Cataract    Cervical radiculopathy 09/07/2020   Cervicalgia 05/30/2016   Chronic low back pain 07/18/2016   Degeneration of lumbar intervertebral disc 01/09/2018   Dyspnea on exertion 05/11/2019   Endometriosis    Esophageal stricture    Essential hypertension 05/06/2009   Excessive daytime sleepiness 05/11/2019   External hemorrhoids 08/28/2010   Fatigue 11/14/2017   Generalized anxiety disorder 10/08/2017   GERD (gastroesophageal reflux disease)    Glaucoma 06/06/2020   Gout 12/11/2013   Hiatal hernia    History of positive PPD 04/09/2022   History of latent TB-completed treatment     Hyperlipidemia 03/16/2010   Hypertensive retinopathy of both eyes 10/06/2019   IBS (irritable bowel syndrome) with chronic constipation 11/14/2010   Insomnia 12/21/2022   Iridocyclitis 10/06/2019  Laryngopharyngeal reflux (LPR) 07/03/2018   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  No significant change from 2017  dexa 10/2015: t score  Spine -1.3, dual femur -0.9  Took alendronate 2014-2019   Pain in left knee 09/08/2014   Post traumatic stress disorder (PTSD)    Primary osteoarthritis  involving multiple joints 03/10/2019   Bilateral hips, knees   Pseudophakia of both eyes 10/06/2019   Pure hypercholesterolemia 03/16/2010   REM sleep behaviors    Renal cell carcinoma    R cryoablation by IR 02/16/11, Dr. Fredia Sorrow  Followed by Dr. Claire Shown  No evidence of residual disease on CT 12/13 and 12/14   Rib pain on left side 01/03/2021   S/P knee surgery 10/30/2016   Snoring 04/09/2022   Spondylolisthesis of lumbar region 01/09/2018   Syncope and collapse 05/16/2009   since childhood; associated with extreme heat   TB lung, latent 11/25/2019   Thrombocytopenia 05/30/2015   Saw hem 02/2017 - mild, chronic - advised to stop protonix, no concerning cause, plan for pcp to monitor   Tinnitus of right ear 07/03/2018   Type 2 diabetes mellitus without complication, without long-term current use of insulin 10/06/2019   Urinary incontinence 07/03/2021   Venous insufficiency of both lower extremities 04/06/2021   Visual hallucinations    Vulvar itching 01/03/2021    MEDICATIONS: Current Outpatient Medications on File Prior to Visit  Medication Sig Dispense Refill   Accu-Chek FastClix Lancets MISC USE AS DIRECTED UP  TO  1 TIME  DAILY- DX CODE R73.03 100 each 1   acetaminophen (TYLENOL) 325 MG tablet Take 1,300 mg by mouth at bedtime.     atorvastatin (LIPITOR) 10 MG tablet TAKE ONE TABLET BY MOUTH DAILY AT 5PM 90 tablet 11   Blood Glucose Monitoring Suppl (ACCU-CHEK GUIDE ME) w/Device KIT Use to check blood sugar daily 1 kit 0   calcium carbonate (OS-CAL) 600 MG TABS tablet Take 600 mg by mouth 2 (two) times daily with a meal.     clotrimazole (LOTRIMIN) 1 % cream Apply topically under breasts daily PRN 30 g 1   DULoxetine (CYMBALTA) 60 MG capsule TAKE ONE CAPSULE BY MOUTH DAILY AT 9AM 90 capsule 11   Elastic Bandages & Supports (MEDICAL COMPRESSION SOCKS) MISC Compression sock, medium compression 20-30 mmHg. Use daily for venous insufficiency I87.2 3 each 0   fluticasone (FLONASE)  50 MCG/ACT nasal spray Place 2 sprays into both nostrils daily. 16 g 11   folic acid (FOLVITE) 1 MG tablet TAKE ONE TABLET BY MOUTH DAILY AT 9AM 240 tablet 11   furosemide (LASIX) 20 MG tablet TAKE ONE TABLET BY MOUTH DAILY AT 9AM 60 tablet 11   glucose blood (ACCU-CHEK GUIDE) test strip Use to check sugars once daily. Dx Code-R73.03 100 each 1   hydrocortisone-pramoxine (ANALPRAM HC SINGLES) 2.5-1 % rectal cream Place 1 Application rectally 3 (three) times daily. 30 g 1   Incontinence Supply Disposable (COMFORT PROTECT ADULT DIAPER/L) MISC Use as directed for urinary incontinence 90 each 5  linaclotide (LINZESS) 72 MCG capsule Take 1 capsule (72 mcg total) by mouth daily before breakfast. For constipation 90 capsule 1   Olopatadine HCl 0.2 % SOLN INSTILL 1 DROP INTO EACH EYE ONCE DAILY     omeprazole (PRILOSEC) 20 MG capsule Take 1 capsule (20 mg total) by mouth daily. 30 capsule 5   OZEMPIC, 1 MG/DOSE, 4 MG/3ML SOPN INJECT 1 MG  SUBCUTANEOUSLY ONCE A WEEK 3 mL 0   polyethylene glycol powder (GLYCOLAX/MIRALAX) 17 GM/SCOOP powder Take 17 g by mouth daily as needed for moderate constipation. 255 g 11   propranolol (INDERAL) 40 MG tablet Take 1 tablet (40 mg total) by mouth 2 (two) times daily. 60 tablet 5   rivastigmine (EXELON) 6 MG capsule Take 1 capsule (6 mg total) by mouth 2 (two) times daily. 60 capsule 6   Suvorexant (BELSOMRA) 15 MG TABS Take 1 tablet (15 mg total) by mouth at bedtime as needed. 30 tablet 0   telmisartan (MICARDIS) 80 MG tablet TAKE ONE TABLET BY MOUTH DAILY AT 9AM (ORIG) 60 tablet 11   triamcinolone cream (KENALOG) 0.1 % Apply 1 Application topically 2 (two) times daily. Use for up to 14 days at a time on rash 30 g 0   No current facility-administered medications on file prior to visit.    ALLERGIES: Allergies  Allergen Reactions   Sertraline Hcl Other (See Comments)    Spasms; numbness   Ace Inhibitors Cough   Codeine Palpitations   Darifenacin Hydrobromide Er  Other (See Comments)    Pt states made her feel queezy & drunk   Gabapentin Other (See Comments)    Hallucinations   Pravastatin Other (See Comments)    myalgias   Propoxyphene Hcl Palpitations   Seroquel [Quetiapine] Other (See Comments)    Woke up screaming and yelling after taking it   Wellbutrin [Bupropion Hcl] Other (See Comments)    Numbness of mouth/lips    FAMILY HISTORY: Family History  Problem Relation Age of Onset   Diabetes Mother    Hypertension Mother    Hyperlipidemia Mother    Heart disease Mother    Stroke Mother    Kidney disease Mother    Thyroid disease Mother    Sleep apnea Mother    Obesity Mother    Memory loss Mother    Kidney disease Father    Prostate cancer Father    Alcoholism Father    Alcohol abuse Father    Multiple sclerosis Sister    Colitis Brother    Alcohol abuse Brother    Leukemia Brother    Alcohol abuse Daughter    Breast cancer Maternal Aunt    Breast cancer Cousin    Cancer Other    Asthma Other    Colon polyps Other    Colon cancer Neg Hx    Esophageal cancer Neg Hx    Pancreatic cancer Neg Hx    Rectal cancer Neg Hx    Stomach cancer Neg Hx       Objective:  Blood pressure 103/76, pulse 96, height 5\' 2"  (1.575 m), weight 204 lb (92.5 kg), SpO2 99%. General: No acute distress.  Patient appears well-groomed.   Head:  Normocephalic/atraumatic Eyes:  Fundi examined but not visualized Neck: supple, no paraspinal tenderness, full range of motion Heart:  Regular rate and rhythm Neurological Exam: alert and oriented to person, place, and time.  Speech fluent and not dysarthric, language intact.      09/07/2021   12:00 PM  07/18/2022    2:00 PM 08/08/2023   10:00 AM  [employer] Mental Exam  Weekday Correct 0 0 1  Current year 1 1 1   What state are we in? 1 1 1   Amount spent 1 0 0  Amount left 0 0 0  # of Animals 3 1 2  5  objects recall 1 1 2   Number series 1 1 1   Hour markers 0 0 0  Time correct 0 0 0   Placed X in triangle correctly 1 1 1   Largest Figure 1 1 1   Name of female 2 0 0  Date back to work 0 0 0  Type of work 0 2 2  State she lived in 0 0 0  Total score 12 9 12    CN II-XII intact. Bulk and tone normal, no rigidity, no bradykinesia, no tremor, muscle strength 5/5 throughout.  Sensation to light touch intact.  Deep tendon reflexes 1+ throughout.  Finger to nose testing intact.  Mildly broad-based gait.  Uses cane.  Romberg negative.   Shon Millet, DO  CC: Cheryll Cockayne, MD

## 2023-08-09 ENCOUNTER — Telehealth: Payer: Self-pay | Admitting: Neurology

## 2023-08-09 NOTE — Telephone Encounter (Signed)
Left message with the after hour service on 08-09-23   Needs to speak with someone about medication  trazadone   15 mg

## 2023-08-12 NOTE — Telephone Encounter (Signed)
She is going to call [facility] and look into his and if needed she will call office back, thanked me for calling

## 2023-08-12 NOTE — Telephone Encounter (Signed)
This is a fax number unable to talk with [facility], will try and reach patient.

## 2023-08-16 ENCOUNTER — Other Ambulatory Visit: Payer: Self-pay | Admitting: Neurology

## 2023-08-22 ENCOUNTER — Telehealth: Payer: Self-pay

## 2023-08-22 ENCOUNTER — Other Ambulatory Visit: Payer: Self-pay | Admitting: Internal Medicine

## 2023-08-22 DIAGNOSIS — F02A3 Dementia in other diseases classified elsewhere, mild, with mood disturbance: Secondary | ICD-10-CM | POA: Diagnosis not present

## 2023-08-22 DIAGNOSIS — G4700 Insomnia, unspecified: Secondary | ICD-10-CM | POA: Diagnosis not present

## 2023-08-22 DIAGNOSIS — G3183 Dementia with Lewy bodies: Secondary | ICD-10-CM | POA: Diagnosis not present

## 2023-08-22 DIAGNOSIS — E1140 Type 2 diabetes mellitus with diabetic neuropathy, unspecified: Secondary | ICD-10-CM | POA: Diagnosis not present

## 2023-08-22 DIAGNOSIS — F02A18 Dementia in other diseases classified elsewhere, mild, with other behavioral disturbance: Secondary | ICD-10-CM | POA: Diagnosis not present

## 2023-08-22 DIAGNOSIS — E119 Type 2 diabetes mellitus without complications: Secondary | ICD-10-CM

## 2023-08-22 DIAGNOSIS — F329 Major depressive disorder, single episode, unspecified: Secondary | ICD-10-CM | POA: Diagnosis not present

## 2023-08-22 DIAGNOSIS — F411 Generalized anxiety disorder: Secondary | ICD-10-CM | POA: Diagnosis not present

## 2023-08-22 DIAGNOSIS — F02A4 Dementia in other diseases classified elsewhere, mild, with anxiety: Secondary | ICD-10-CM | POA: Diagnosis not present

## 2023-08-22 NOTE — Telephone Encounter (Signed)
 Copied from CRM [id number]. Topic: Clinical - Medication Question >> Aug 22, 2023 10:25 AM Irine Seal wrote: Reason for CRM: Darel Hong, the pharmacist  with [facility] calling to confirm changes to the following medications:  Propranolol 80 mg has been decreased to 40 mg 2x daily ( confirmed)  Linaclotide (LINZESS) 72 mcg. (Confirmed)   Duloxetine (CYMBALTA) 60 mg capsule was inquired about for discontinuation due to the Citalopram (CELEXA) 10 mg tablet that is being prescribed by the neurologist.  Inocencio Homes there was no official discontinuation of the Cymbalta, only patient reported not taking on 08/08/23 she stated she would reach out the neurologist Shon Millet

## 2023-08-23 ENCOUNTER — Other Ambulatory Visit: Payer: Self-pay | Admitting: Internal Medicine

## 2023-08-23 DIAGNOSIS — E119 Type 2 diabetes mellitus without complications: Secondary | ICD-10-CM

## 2023-09-03 ENCOUNTER — Telehealth: Payer: Self-pay

## 2023-09-03 NOTE — Telephone Encounter (Signed)
 Copied from CRM [id number]. Topic: Clinical - Home Health Verbal Orders >> Sep 03, 2023  3:44 PM Deaijah H wrote: Charlynne Pander RN [facility] called in stating patient needs an evaluation for Speech therapy and physical therapy & social work. Secure number for voicemail [phone]

## 2023-09-03 NOTE — Telephone Encounter (Signed)
 Copied from CRM [id number]. Topic: Clinical - Home Health Verbal Orders >> Sep 03, 2023  3:42 PM Deaijah H wrote: Caller/Agency: Cara RN [facility] Callback Number: [phone] Service Requested: Skilled Nursing Frequency: 1w for 3w Any new concerns about the patient? No

## 2023-09-03 NOTE — Telephone Encounter (Signed)
 Okay for orders?

## 2023-09-04 NOTE — Telephone Encounter (Signed)
 Message left today with verbals.

## 2023-09-06 ENCOUNTER — Telehealth: Payer: Self-pay

## 2023-09-06 DIAGNOSIS — F02A3 Dementia in other diseases classified elsewhere, mild, with mood disturbance: Secondary | ICD-10-CM | POA: Diagnosis not present

## 2023-09-06 DIAGNOSIS — E1140 Type 2 diabetes mellitus with diabetic neuropathy, unspecified: Secondary | ICD-10-CM | POA: Diagnosis not present

## 2023-09-06 DIAGNOSIS — G3183 Dementia with Lewy bodies: Secondary | ICD-10-CM | POA: Diagnosis not present

## 2023-09-06 DIAGNOSIS — G4700 Insomnia, unspecified: Secondary | ICD-10-CM | POA: Diagnosis not present

## 2023-09-06 DIAGNOSIS — F411 Generalized anxiety disorder: Secondary | ICD-10-CM | POA: Diagnosis not present

## 2023-09-06 DIAGNOSIS — F02A18 Dementia in other diseases classified elsewhere, mild, with other behavioral disturbance: Secondary | ICD-10-CM | POA: Diagnosis not present

## 2023-09-06 DIAGNOSIS — F02A4 Dementia in other diseases classified elsewhere, mild, with anxiety: Secondary | ICD-10-CM | POA: Diagnosis not present

## 2023-09-06 DIAGNOSIS — F329 Major depressive disorder, single episode, unspecified: Secondary | ICD-10-CM | POA: Diagnosis not present

## 2023-09-06 NOTE — Telephone Encounter (Signed)
 Okay for orders?

## 2023-09-06 NOTE — Telephone Encounter (Signed)
 Never concerned about 1 blood pressure reading being elevated-would need more readings to get a sense of what her blood pressure is doing.  Continue to monitor.  No changes.

## 2023-09-06 NOTE — Telephone Encounter (Signed)
 VM left with updated info.

## 2023-09-06 NOTE — Telephone Encounter (Signed)
 Copied from CRM [id number]. Topic: Clinical - Home Health Verbal Orders >> Sep 06, 2023 10:54 AM Theodis Sato wrote: Caller/Agency: Harl Bowie  Callback Number: [phone] Service Requested: Physical therapy  Frequency: 1 week 7  Any new concerns about the patient? Blood pressure at 152/100

## 2023-09-08 ENCOUNTER — Other Ambulatory Visit: Payer: Self-pay | Admitting: Internal Medicine

## 2023-09-08 DIAGNOSIS — E119 Type 2 diabetes mellitus without complications: Secondary | ICD-10-CM

## 2023-09-10 ENCOUNTER — Telehealth: Payer: Self-pay | Admitting: Neurology

## 2023-09-10 NOTE — Telephone Encounter (Signed)
 Admitted Pt. Need Verbal approval for nurses and PT services

## 2023-09-11 DIAGNOSIS — F329 Major depressive disorder, single episode, unspecified: Secondary | ICD-10-CM | POA: Diagnosis not present

## 2023-09-11 DIAGNOSIS — F02A4 Dementia in other diseases classified elsewhere, mild, with anxiety: Secondary | ICD-10-CM | POA: Diagnosis not present

## 2023-09-11 DIAGNOSIS — F411 Generalized anxiety disorder: Secondary | ICD-10-CM | POA: Diagnosis not present

## 2023-09-11 DIAGNOSIS — G4700 Insomnia, unspecified: Secondary | ICD-10-CM | POA: Diagnosis not present

## 2023-09-11 DIAGNOSIS — F02A3 Dementia in other diseases classified elsewhere, mild, with mood disturbance: Secondary | ICD-10-CM | POA: Diagnosis not present

## 2023-09-11 DIAGNOSIS — F02A18 Dementia in other diseases classified elsewhere, mild, with other behavioral disturbance: Secondary | ICD-10-CM | POA: Diagnosis not present

## 2023-09-11 DIAGNOSIS — E1140 Type 2 diabetes mellitus with diabetic neuropathy, unspecified: Secondary | ICD-10-CM | POA: Diagnosis not present

## 2023-09-11 DIAGNOSIS — G3183 Dementia with Lewy bodies: Secondary | ICD-10-CM | POA: Diagnosis not present

## 2023-09-13 ENCOUNTER — Inpatient Hospital Stay (HOSPITAL_COMMUNITY)
Admission: RE | Admit: 2023-09-13 | Discharge: 2023-09-16 | DRG: 689 | Disposition: A | Discharge status: Home Health | Attending: Family Medicine | Admitting: Family Medicine

## 2023-09-13 ENCOUNTER — Telehealth: Payer: Self-pay | Admitting: Neurology

## 2023-09-13 ENCOUNTER — Emergency Department (HOSPITAL_COMMUNITY)

## 2023-09-13 ENCOUNTER — Encounter (HOSPITAL_COMMUNITY): Payer: Self-pay

## 2023-09-13 ENCOUNTER — Other Ambulatory Visit: Payer: Self-pay

## 2023-09-13 DIAGNOSIS — Z23 Encounter for immunization: Secondary | ICD-10-CM | POA: Diagnosis not present

## 2023-09-13 DIAGNOSIS — E66811 Obesity, class 1: Secondary | ICD-10-CM | POA: Diagnosis present

## 2023-09-13 DIAGNOSIS — Z9841 Cataract extraction status, right eye: Secondary | ICD-10-CM

## 2023-09-13 DIAGNOSIS — Z83438 Family history of other disorder of lipoprotein metabolism and other lipidemia: Secondary | ICD-10-CM

## 2023-09-13 DIAGNOSIS — Z885 Allergy status to narcotic agent status: Secondary | ICD-10-CM

## 2023-09-13 DIAGNOSIS — K5900 Constipation, unspecified: Secondary | ICD-10-CM | POA: Diagnosis present

## 2023-09-13 DIAGNOSIS — F411 Generalized anxiety disorder: Secondary | ICD-10-CM | POA: Diagnosis present

## 2023-09-13 DIAGNOSIS — E1140 Type 2 diabetes mellitus with diabetic neuropathy, unspecified: Secondary | ICD-10-CM | POA: Diagnosis not present

## 2023-09-13 DIAGNOSIS — S199XXA Unspecified injury of neck, initial encounter: Secondary | ICD-10-CM | POA: Diagnosis not present

## 2023-09-13 DIAGNOSIS — G4700 Insomnia, unspecified: Secondary | ICD-10-CM | POA: Diagnosis not present

## 2023-09-13 DIAGNOSIS — M542 Cervicalgia: Secondary | ICD-10-CM | POA: Diagnosis not present

## 2023-09-13 DIAGNOSIS — I10 Essential (primary) hypertension: Secondary | ICD-10-CM | POA: Diagnosis not present

## 2023-09-13 DIAGNOSIS — Z1152 Encounter for screening for COVID-19: Secondary | ICD-10-CM | POA: Diagnosis not present

## 2023-09-13 DIAGNOSIS — R4182 Altered mental status, unspecified: Secondary | ICD-10-CM | POA: Diagnosis present

## 2023-09-13 DIAGNOSIS — Z8249 Family history of ischemic heart disease and other diseases of the circulatory system: Secondary | ICD-10-CM | POA: Diagnosis not present

## 2023-09-13 DIAGNOSIS — Z9842 Cataract extraction status, left eye: Secondary | ICD-10-CM

## 2023-09-13 DIAGNOSIS — Z6837 Body mass index (BMI) 37.0-37.9, adult: Secondary | ICD-10-CM | POA: Diagnosis not present

## 2023-09-13 DIAGNOSIS — G9341 Metabolic encephalopathy: Secondary | ICD-10-CM | POA: Diagnosis not present

## 2023-09-13 DIAGNOSIS — Z8615 Personal history of latent tuberculosis infection: Secondary | ICD-10-CM

## 2023-09-13 DIAGNOSIS — E7800 Pure hypercholesterolemia, unspecified: Secondary | ICD-10-CM | POA: Diagnosis present

## 2023-09-13 DIAGNOSIS — Z8349 Family history of other endocrine, nutritional and metabolic diseases: Secondary | ICD-10-CM

## 2023-09-13 DIAGNOSIS — G3183 Dementia with Lewy bodies: Secondary | ICD-10-CM | POA: Diagnosis not present

## 2023-09-13 DIAGNOSIS — F02818 Dementia in other diseases classified elsewhere, unspecified severity, with other behavioral disturbance: Secondary | ICD-10-CM | POA: Diagnosis not present

## 2023-09-13 DIAGNOSIS — F02A3 Dementia in other diseases classified elsewhere, mild, with mood disturbance: Secondary | ICD-10-CM | POA: Diagnosis not present

## 2023-09-13 DIAGNOSIS — Z841 Family history of disorders of kidney and ureter: Secondary | ICD-10-CM

## 2023-09-13 DIAGNOSIS — Z85528 Personal history of other malignant neoplasm of kidney: Secondary | ICD-10-CM

## 2023-09-13 DIAGNOSIS — F0282 Dementia in other diseases classified elsewhere, unspecified severity, with psychotic disturbance: Secondary | ICD-10-CM | POA: Diagnosis present

## 2023-09-13 DIAGNOSIS — E876 Hypokalemia: Secondary | ICD-10-CM | POA: Diagnosis present

## 2023-09-13 DIAGNOSIS — F02A4 Dementia in other diseases classified elsewhere, mild, with anxiety: Secondary | ICD-10-CM | POA: Diagnosis not present

## 2023-09-13 DIAGNOSIS — F329 Major depressive disorder, single episode, unspecified: Secondary | ICD-10-CM | POA: Diagnosis not present

## 2023-09-13 DIAGNOSIS — Z7985 Long-term (current) use of injectable non-insulin antidiabetic drugs: Secondary | ICD-10-CM | POA: Diagnosis not present

## 2023-09-13 DIAGNOSIS — N179 Acute kidney failure, unspecified: Secondary | ICD-10-CM | POA: Diagnosis present

## 2023-09-13 DIAGNOSIS — F0284 Dementia in other diseases classified elsewhere, unspecified severity, with anxiety: Secondary | ICD-10-CM | POA: Diagnosis present

## 2023-09-13 DIAGNOSIS — F02B18 Dementia in other diseases classified elsewhere, moderate, with other behavioral disturbance: Secondary | ICD-10-CM | POA: Diagnosis not present

## 2023-09-13 DIAGNOSIS — F32A Depression, unspecified: Secondary | ICD-10-CM | POA: Diagnosis present

## 2023-09-13 DIAGNOSIS — R7401 Elevation of levels of liver transaminase levels: Secondary | ICD-10-CM | POA: Diagnosis present

## 2023-09-13 DIAGNOSIS — N390 Urinary tract infection, site not specified: Principal | ICD-10-CM | POA: Diagnosis present

## 2023-09-13 DIAGNOSIS — S0990XA Unspecified injury of head, initial encounter: Secondary | ICD-10-CM | POA: Diagnosis not present

## 2023-09-13 DIAGNOSIS — Z803 Family history of malignant neoplasm of breast: Secondary | ICD-10-CM

## 2023-09-13 DIAGNOSIS — N3000 Acute cystitis without hematuria: Secondary | ICD-10-CM | POA: Diagnosis not present

## 2023-09-13 DIAGNOSIS — Z825 Family history of asthma and other chronic lower respiratory diseases: Secondary | ICD-10-CM

## 2023-09-13 DIAGNOSIS — Z888 Allergy status to other drugs, medicaments and biological substances status: Secondary | ICD-10-CM

## 2023-09-13 DIAGNOSIS — F4310 Post-traumatic stress disorder, unspecified: Secondary | ICD-10-CM | POA: Diagnosis present

## 2023-09-13 DIAGNOSIS — F0283 Dementia in other diseases classified elsewhere, unspecified severity, with mood disturbance: Secondary | ICD-10-CM | POA: Diagnosis present

## 2023-09-13 DIAGNOSIS — Z820 Family history of epilepsy and other diseases of the nervous system: Secondary | ICD-10-CM

## 2023-09-13 DIAGNOSIS — R531 Weakness: Secondary | ICD-10-CM | POA: Diagnosis not present

## 2023-09-13 DIAGNOSIS — Z823 Family history of stroke: Secondary | ICD-10-CM

## 2023-09-13 DIAGNOSIS — F02A18 Dementia in other diseases classified elsewhere, mild, with other behavioral disturbance: Secondary | ICD-10-CM | POA: Diagnosis not present

## 2023-09-13 DIAGNOSIS — Z806 Family history of leukemia: Secondary | ICD-10-CM

## 2023-09-13 DIAGNOSIS — Z79899 Other long term (current) drug therapy: Secondary | ICD-10-CM

## 2023-09-13 DIAGNOSIS — Z833 Family history of diabetes mellitus: Secondary | ICD-10-CM

## 2023-09-13 DIAGNOSIS — E119 Type 2 diabetes mellitus without complications: Secondary | ICD-10-CM | POA: Diagnosis present

## 2023-09-13 DIAGNOSIS — Z96651 Presence of right artificial knee joint: Secondary | ICD-10-CM | POA: Diagnosis present

## 2023-09-13 DIAGNOSIS — F0280 Dementia in other diseases classified elsewhere without behavioral disturbance: Secondary | ICD-10-CM | POA: Diagnosis not present

## 2023-09-13 DIAGNOSIS — R410 Disorientation, unspecified: Secondary | ICD-10-CM | POA: Diagnosis not present

## 2023-09-13 DIAGNOSIS — R059 Cough, unspecified: Secondary | ICD-10-CM | POA: Diagnosis not present

## 2023-09-13 DIAGNOSIS — Z961 Presence of intraocular lens: Secondary | ICD-10-CM | POA: Diagnosis present

## 2023-09-13 DIAGNOSIS — Z83719 Family history of colon polyps, unspecified: Secondary | ICD-10-CM

## 2023-09-13 DIAGNOSIS — K219 Gastro-esophageal reflux disease without esophagitis: Secondary | ICD-10-CM | POA: Diagnosis present

## 2023-09-13 LAB — URINALYSIS, ROUTINE W REFLEX MICROSCOPIC
Glucose, UA: 100 mg/dL — AB
Ketones, ur: NEGATIVE mg/dL
Nitrite: POSITIVE — AB
Protein, ur: 100 mg/dL — AB
Specific Gravity, Urine: >1.03 — ABNORMAL HIGH (ref 1.005–1.030)
pH: 6 (ref 5.0–8.0)

## 2023-09-13 LAB — COMPREHENSIVE METABOLIC PANEL
ALT: 18 U/L (ref 0–44)
AST: 42 U/L — ABNORMAL HIGH (ref 15–41)
Albumin: 3.7 g/dL (ref 3.5–5.0)
Alkaline Phosphatase: 82 U/L (ref 38–126)
Anion gap: 6 (ref 5–15)
BUN: 13 mg/dL (ref 8–23)
CO2: 30 mmol/L (ref 22–32)
Calcium: 10 mg/dL (ref 8.9–10.3)
Chloride: 104 mmol/L (ref 98–111)
Creatinine, Ser: 1.44 mg/dL — ABNORMAL HIGH (ref 0.44–1.00)
GFR, Estimated: 40 mL/min — ABNORMAL LOW (ref >60)
Glucose, Bld: 87 mg/dL (ref 70–99)
Potassium: 2.9 mmol/L — ABNORMAL LOW (ref 3.5–5.1)
Sodium: 140 mmol/L (ref 135–145)
Total Bilirubin: 0.7 mg/dL (ref 0.0–1.2)
Total Protein: 6.9 g/dL (ref 6.5–8.1)

## 2023-09-13 LAB — CBC
HCT: 42.8 % (ref 36.0–46.0)
Hemoglobin: 14.1 g/dL (ref 12.0–15.0)
MCH: 32 pg (ref 26.0–34.0)
MCHC: 32.9 g/dL (ref 30.0–36.0)
MCV: 97.1 fL (ref 80.0–100.0)
Platelets: 131 10*3/uL — ABNORMAL LOW (ref 150–400)
RBC: 4.41 MIL/uL (ref 3.87–5.11)
RDW: 13.9 % (ref 11.5–15.5)
WBC: 11.9 10*3/uL — ABNORMAL HIGH (ref 4.0–10.5)
nRBC: 0 % (ref 0.0–0.2)

## 2023-09-13 LAB — URINALYSIS, MICROSCOPIC (REFLEX)

## 2023-09-13 LAB — RESP PANEL BY RT-PCR (RSV, FLU A&B, COVID)  RVPGX2
Influenza A by PCR: NEGATIVE
Influenza B by PCR: NEGATIVE
Resp Syncytial Virus by PCR: NEGATIVE
SARS Coronavirus 2 by RT PCR: NEGATIVE

## 2023-09-13 LAB — TSH: TSH: 1.819 u[IU]/mL (ref 0.350–4.500)

## 2023-09-13 MED ORDER — MELATONIN 5 MG PO TABS
5.0000 mg | ORAL_TABLET | Freq: Every evening | ORAL | Status: DC | PRN
  Administered 2023-09-15: 5 mg via ORAL
  Filled 2023-09-13: qty 1

## 2023-09-13 MED ORDER — PROCHLORPERAZINE EDISYLATE 10 MG/2ML IJ SOLN: 5.0000 mg | Freq: Four times a day (QID) | INTRAMUSCULAR | Status: DC | PRN

## 2023-09-13 MED ORDER — SODIUM CHLORIDE 0.9 % IV SOLN
1.0000 g | Freq: Once | INTRAVENOUS | Status: AC
  Administered 2023-09-13: 1 g via INTRAVENOUS
  Filled 2023-09-13: qty 10

## 2023-09-13 MED ORDER — POTASSIUM CHLORIDE 20 MEQ PO PACK
40.0000 meq | PACK | Freq: Once | ORAL | Status: AC
  Administered 2023-09-13: 40 meq via ORAL
  Filled 2023-09-13: qty 2

## 2023-09-13 MED ORDER — POTASSIUM CHLORIDE 2 MEQ/ML IV SOLN
INTRAVENOUS | Status: AC
  Filled 2023-09-13 (×2): qty 1000

## 2023-09-13 MED ORDER — ACETAMINOPHEN 325 MG PO TABS: 650.0000 mg | ORAL_TABLET | Freq: Four times a day (QID) | ORAL | Status: DC | PRN

## 2023-09-13 MED ORDER — POLYETHYLENE GLYCOL 3350 17 G PO PACK
17.0000 g | PACK | Freq: Every day | ORAL | Status: DC | PRN
  Administered 2023-09-14 – 2023-09-15 (×2): 17 g via ORAL
  Filled 2023-09-13 (×2): qty 1

## 2023-09-13 MED ORDER — POTASSIUM CHLORIDE 10 MEQ/100ML IV SOLN
10.0000 meq | INTRAVENOUS | Status: AC
  Administered 2023-09-13 – 2023-09-14 (×2): 10 meq via INTRAVENOUS
  Filled 2023-09-13 (×2): qty 100

## 2023-09-13 MED ORDER — ENOXAPARIN SODIUM 40 MG/0.4ML IJ SOSY
40.0000 mg | PREFILLED_SYRINGE | INTRAMUSCULAR | Status: DC
  Administered 2023-09-14 – 2023-09-16 (×3): 40 mg via SUBCUTANEOUS
  Filled 2023-09-13 (×3): qty 0.4

## 2023-09-13 MED ORDER — LACTATED RINGERS IV BOLUS
1000.0000 mL | Freq: Once | INTRAVENOUS | Status: AC
  Administered 2023-09-13: 1000 mL via INTRAVENOUS

## 2023-09-13 NOTE — Telephone Encounter (Signed)
 Pt. Daughter states mother dementia has worsen and she is having hallucinations and insomnia including shaking of the hands, please advise if she can be seen earlier and what to do in the between time

## 2023-09-13 NOTE — ED Notes (Signed)
 To ct

## 2023-09-13 NOTE — ED Notes (Signed)
C-collar was applied in triage 

## 2023-09-13 NOTE — Telephone Encounter (Signed)
Patient is scheduled in clinic.

## 2023-09-13 NOTE — Telephone Encounter (Signed)
 Home health nursing and physical therapy ar ein the home, please advise. Next appt is 01/26/2024 please advise.

## 2023-09-13 NOTE — ED Provider Triage Note (Signed)
 Emergency Medicine Provider Triage Evaluation Note  Janet Le , a 68 y.o. female  was evaluated in triage.  Pt complains of general weakness, non prod cough, altered ms/confusion in past few days. Had a fall last night, tripped, hit head. No loc, c/o neck pain since fall. No radicular pain. No numbness/weakness. No change in speech or vision. No other pain or injury.   Review of Systems  Positive: Fall, neck pain, gen weakness.  Negative: Focal or unilateral weakness, cp, abd pain,   Physical Exam  Ht 1.575 m (5\' 2" )   Wt 92.5 kg   BMI 37.30 kg/m  Gen:   Awake, no distress   Head: no gross signs of trauma. No sinus or temporal tenderness. Pharynx normal.  Resp:  Normal effort rrr. Cta bil.  MSK:   Moves extremities without difficulty no focal pain or bony tenderness. Mid cervical tenderness, otherwise, CTLS spine, non tender, aligned, no step off. No neck stiffness/rigidity.  Abd soft nt.   Medical Decision Making  Medically screening exam initiated at 6:18 PM.  Appropriate orders placed.  Janet Le was informed that the remainder of the evaluation will be completed by another provider, this initial triage assessment does not replace that evaluation, and the importance of remaining in the ED until their evaluation is complete.  Apply ccollar until imaging done. Labs and imaging.      Janet Laine, MD 09/13/23 Janet Le

## 2023-09-13 NOTE — Progress Notes (Signed)
 Orthopedic Tech Progress Note Patient Details:  [name] 05-01-1956 [id number]  Patient ID: Janet Le, female   DOB: [birth year], 68 y.o.   MRN: [id number] C-Collar applied by nurse on floor. [name] 09/13/2023, 8:27 PM

## 2023-09-13 NOTE — ED Triage Notes (Addendum)
 Pt's daughter states pt has had generalized weakness started last night. Pt was having hallucinations thinking her nieces were over her house last night and they wasn't. Pt's daughter states the speech has been low and hard to understand started around 1000 today. Pt has GCS of 14. Pt stated she fell last night when she was trying to move. Pt denies blood thinners.

## 2023-09-13 NOTE — Telephone Encounter (Signed)
 Janet Le with home health called to get verbal orders for speech. She would like a call back [phone]

## 2023-09-13 NOTE — Telephone Encounter (Signed)
 Needs verbal orders for [facility], physical therapy ordered an nursing.

## 2023-09-13 NOTE — Telephone Encounter (Signed)
 Gave verbal already

## 2023-09-13 NOTE — ED Provider Notes (Signed)
 [facility] DEPT [facility] Emergency Department Provider Note MRN:  [id number]  Arrival date & time: 09/13/23     Chief Complaint   Weakness   History of Present Illness   Janet Le is a 68 y.o. year-old female presents to the ED with chief complaint of generalized weakness.  Hx of lewy body dementia, but patient BIB daughter for worsening confusion, hallucinations, and generalized weakness.  Patient reports having had a fall earlier.  She had CT of head and neck and CXR in triage, which are reassuring.  She complains of some left sided neck pain, but attributes it to sore muscles.  She states that she had some dark urine.[name]  History provided by patient.   Review of Systems  Pertinent positive and negative review of systems noted in HPI.    Physical Exam   Vitals:   09/13/23 2209 09/13/23 2222  BP: 114/67 102/65  Pulse: 67 61  Resp: 15 16  Temp:  97.8 F (36.6 C)  SpO2: 99% 99%    CONSTITUTIONAL:  fatigued-appearing, NAD NEURO:  Alert and oriented x 3, CN 3-12 grossly intact EYES:  eyes equal and reactive ENT/NECK:  Supple, no stridor  CARDIO:  normal rate, regular rhythm, appears well-perfused  PULM:  No respiratory distress, CTAB GI/GU:  non-distended, no focal abdominal discomfort MSK/SPINE:  No gross deformities, no edema, moves all extremities  SKIN:  no rash, atraumatic   *Additional and/or pertinent findings included in MDM below  Diagnostic and Interventional Summary    EKG Interpretation Date/Time:    Ventricular Rate:    PR Interval:    QRS Duration:    QT Interval:    QTC Calculation:   R Axis:      Text Interpretation:         Labs Reviewed  CBC - Abnormal; Notable for the following components:      Result Value   WBC 11.9 (*)    Platelets 131 (*)    All other components within normal limits  COMPREHENSIVE METABOLIC PANEL - Abnormal; Notable for the following components:   Potassium 2.9 (*)    Creatinine, Ser 1.44 (*)     AST 42 (*)    GFR, Estimated 40 (*)    All other components within normal limits  URINALYSIS, ROUTINE W REFLEX MICROSCOPIC - Abnormal; Notable for the following components:   Color, Urine ORANGE (*)    APPearance CLOUDY (*)    Specific Gravity, Urine >1.030 (*)    Glucose, UA 100 (*)    Hgb urine dipstick TRACE (*)    Bilirubin Urine MODERATE (*)    Protein, ur 100 (*)    Nitrite POSITIVE (*)    Leukocytes,Ua TRACE (*)    All other components within normal limits  URINALYSIS, MICROSCOPIC (REFLEX) - Abnormal; Notable for the following components:   Bacteria, UA MANY (*)    All other components within normal limits  RESP PANEL BY RT-PCR (RSV, FLU A&B, COVID)  [id number]  URINE CULTURE  TSH  CK  HIV ANTIBODY (ROUTINE TESTING W REFLEX)  CBC  CREATININE, SERUM  CBC  COMPREHENSIVE METABOLIC PANEL  MAGNESIUM  PHOSPHORUS    CT Cervical Spine Wo Contrast  Final Result    CT Head Wo Contrast  Final Result    DG Chest 2 View  Final Result      Medications  potassium chloride 10 mEq in 100 mL IVPB (10 mEq Intravenous New Bag/Given 09/13/23 2334)  enoxaparin (LOVENOX) injection 40 mg (  has no administration in time range)  lactated ringers 1,000 mL with potassium chloride 20 mEq infusion (has no administration in time range)  acetaminophen (TYLENOL) tablet 650 mg (has no administration in time range)  prochlorperazine (COMPAZINE) injection 5 mg (has no administration in time range)  polyethylene glycol (MIRALAX / GLYCOLAX) packet 17 g (has no administration in time range)  melatonin tablet 5 mg (has no administration in time range)  cefTRIAXone (ROCEPHIN) 1 g in sodium chloride 0.9 % 100 mL IVPB (0 g Intravenous Stopped 09/13/23 2313)  potassium chloride (KLOR-CON) packet 40 mEq (40 mEq Oral Given 09/13/23 2235)  lactated ringers bolus 1,000 mL (0 mLs Intravenous Stopped 09/13/23 2329)     Procedures  /  Critical Care Procedures  ED Course and Medical Decision Making  I have  reviewed the triage vital signs, the nursing notes, and pertinent available records from the EMR.  Social Determinants Affecting Complexity of Care: Patient has no clinically significant social determinants affecting this chief complaint..   ED Course: Clinical Course as of 09/13/23 2336  Fri Sep 13, 2023  2310 CBC(!) Mild leukocytosis, receiving abx for UTI. [RB]  2310 Comprehensive metabolic panel(!) Potassium is low at 2.9, will give supplemental K. [RB]  2310 Urinalysis, Routine w reflex microscopic -Urine, Clean Catch(!) [RB]  2310 Urinalysis, Microscopic (reflex)(!) UA worrisome for infection.  I think this could be the cause of her worsening weakness and hallucinations.  No documented fever. [RB]    Clinical Course User Index [RB] Roxy Horseman, PA-C    Medical Decision Making Patient here with generalized weakness, worsening mental status, including hallucinations, sleeping more.  Different from baseline that worsened yesterday and today.    Workup somewhat concerning for UTI.  Will treat with rocephin.  Will supplement K.  Will need admission for IV antibiotics and observations of mental status.  Amount and/or Complexity of Data Reviewed Labs: ordered. Decision-making details documented in ED Course. Radiology: independent interpretation performed. Decision-making details documented in ED Course.    Details: No obvious opacity or effusion seen on cxr  Risk Prescription drug management. Decision regarding hospitalization.         Consultants: I consulted with Hospitalist, Dr. Margo Aye, who is appreciated for admitting.   Treatment and Plan: Patient's exam and diagnostic results are concerning for UTI and worsening mental status.  Feel that patient will need admission to the hospital for further treatment and evaluation.    Final Clinical Impressions(s) / ED Diagnoses     ICD-10-CM   1. Confusion  R41.0     2. Acute cystitis without hematuria  N30.00        ED Discharge Orders     None         Discharge Instructions Discussed with and Provided to Patient:   Discharge Instructions   None      Roxy Horseman, PA-C 09/13/23 2337    Glynn Octave, MD 09/14/23 [phone]

## 2023-09-13 NOTE — H&P (Addendum)
 History and Physical  Janet Le XBM:[id number] DOB: 1956-03-18 DOA: 09/13/2023  Referring physician: Vertis Kelch  PCP: Pincus Sanes, MD  Outpatient Specialists: Neurology. Patient coming from: Home, lives with her daughter.  Chief Complaint: Altered mental status.  HPI: Janet Le is a 68 y.o. female with medical history significant for Lewy body dementia with mood disturbance, essential hypertension, type 2 diabetes, hyperlipidemia, generalized anxiety disorder, GERD, insomnia, who presents to the ER from home due to altered mental status, confusion, visual hallucinations, worse than her baseline.  Associated with generalized weakness that started last night.  In the ER, afebrile, leukocytosis 11.9 K.  UA positive for pyuria.  Chest x-ray with no acute findings.  Lab work notable for elevated creatinine above baseline.  The patient received Rocephin 2, 1 L LR bolus x 1, and potassium replacement orally and intravenously.  TRH, hospitalist service, was asked to admit.  ED Course: Temperature 97.8.  BP 102/65, pulse 61, respiratory 16, O2 saturation 11% on room air.  Review of Systems: Review of systems as noted in the HPI. All other systems reviewed and are negative.   Past Medical History:  Diagnosis Date   Allergic rhinitis 08/31/2009   Arthralgia of hip 11/14/2017   Attention deficit hyperactivity disorder 06/06/2020   diagnosed as adult w/o formal testing   Bilateral leg edema 06/12/2018   Blepharitis 11/07/2019   Blood dyscrasia    platelets low in past   Breast asymmetry 04/08/2021   Cataract    Cervical radiculopathy 09/07/2020   Cervicalgia 05/30/2016   Chronic low back pain 07/18/2016   Degeneration of lumbar intervertebral disc 01/09/2018   Dyspnea on exertion 05/11/2019   Endometriosis    Esophageal stricture    Essential hypertension 05/06/2009   Excessive daytime sleepiness 05/11/2019   External hemorrhoids 08/28/2010   Fatigue 11/14/2017    Generalized anxiety disorder 10/08/2017   GERD (gastroesophageal reflux disease)    Glaucoma 06/06/2020   Gout 12/11/2013   Hiatal hernia    History of positive PPD 04/09/2022   History of latent TB-completed treatment     Hyperlipidemia 03/16/2010   Hypertensive retinopathy of both eyes 10/06/2019   IBS (irritable bowel syndrome) with chronic constipation 11/14/2010   Insomnia 12/21/2022   Iridocyclitis 10/06/2019   Laryngopharyngeal reflux (LPR) 07/03/2018   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  No significant change from 2017  dexa 10/2015: t score  Spine -1.3, dual femur -0.9  Took alendronate 2014-2019   Pain in left knee 09/08/2014   Post traumatic stress disorder (PTSD)    Primary osteoarthritis involving multiple joints 03/10/2019   Bilateral hips, knees   Pseudophakia of both eyes 10/06/2019   Pure hypercholesterolemia 03/16/2010   REM sleep behaviors    Renal cell carcinoma    R cryoablation by IR 02/16/11, Dr. Fredia Sorrow  Followed by Dr. Claire Shown  No evidence of residual disease on CT 12/13 and 12/14   Rib pain on left side 01/03/2021   S/P knee surgery 10/30/2016   Snoring 04/09/2022  Spondylolisthesis of lumbar region 01/09/2018   Syncope and collapse 05/16/2009   since childhood; associated with extreme heat   TB lung, latent 11/25/2019   Thrombocytopenia 05/30/2015   Saw hem 02/2017 - mild, chronic - advised to stop protonix, no concerning cause, plan for pcp to monitor   Tinnitus of right ear 07/03/2018   Type 2 diabetes mellitus  without complication, without long-term current use of insulin 10/06/2019   Urinary incontinence 07/03/2021   Venous insufficiency of both lower extremities 04/06/2021   Visual hallucinations    Vulvar itching 01/03/2021   Past Surgical History:  Procedure Laterality Date   BREAST CYST EXCISION Bilateral over 10 years ago   No visable scar    CATARACT EXTRACTION, BILATERAL Bilateral 2020   COLONOSCOPY     fallopian tube removed     FUNCTIONAL ENDOSCOPIC SINUS SURGERY     HEMORRHOID BANDING     LASER ABLATION OF THE CERVIX     NASAL SINUS SURGERY     RENAL CRYOABLATION  02/16/11   R kidney due to RCC (IR procedure)   TOTAL KNEE ARTHROPLASTY Right 10/30/2016   Procedure: RIGHT TOTAL KNEE ARTHROPLASTY;  Surgeon: Cammy Copa, MD;  Location: [facility] OR;  Service: Orthopedics;  Laterality: Right;   TUBAL LIGATION     UMBILICAL HERNIA REPAIR      Social History:  reports that she has never smoked. She has been exposed to tobacco smoke. She has never used smokeless tobacco. She reports that she does not drink alcohol and does not use drugs.   Allergies  Allergen Reactions   Sertraline Hcl Other (See Comments)    Spasms; numbness   Ace Inhibitors Cough   Codeine Palpitations   Darifenacin Hydrobromide Er Other (See Comments)    Pt states made her feel queezy & drunk   Gabapentin Other (See Comments)    Hallucinations   Pravastatin Other (See Comments)    myalgias   Propoxyphene Hcl Palpitations   Seroquel [Quetiapine] Other (See Comments)    Woke up screaming and yelling after taking it   Wellbutrin [Bupropion Hcl] Other (See Comments)    Numbness of mouth/lips    Family History  Problem Relation Age of Onset   Diabetes Mother    Hypertension Mother    Hyperlipidemia Mother    Heart disease Mother    Stroke Mother    Kidney disease Mother    Thyroid disease Mother    Sleep apnea Mother    Obesity Mother    Memory loss Mother    Kidney disease Father    Prostate  cancer Father    Alcoholism Father    Alcohol abuse Father    Multiple sclerosis Sister    Colitis Brother    Alcohol abuse Brother    Leukemia Brother    Alcohol abuse Daughter    Breast cancer Maternal Aunt    Breast cancer Cousin    Cancer Other    Asthma Other    Colon polyps Other    Colon cancer Neg Hx    Esophageal cancer Neg Hx    Pancreatic cancer Neg Hx    Rectal cancer Neg Hx    Stomach cancer Neg Hx       Prior to Admission medications   Medication Sig Start Date End Date Taking? Authorizing Provider  Accu-Chek FastClix Lancets MISC USE AS DIRECTED UP  TO  1 TIME  DAILY- DX CODE R73.03 04/07/20   Pincus Sanes, MD  acetaminophen (  TYLENOL) 325 MG tablet Take 1,300 mg by mouth at bedtime.    [provider]  atorvastatin (LIPITOR) 10 MG tablet TAKE ONE TABLET BY MOUTH DAILY AT 5PM 03/25/23   Pincus Sanes, MD  Blood Glucose Monitoring Suppl (ACCU-CHEK GUIDE ME) w/Device KIT Use to check blood sugar daily 02/06/21   Pincus Sanes, MD  calcium carbonate (OS-CAL) 600 MG TABS tablet Take 600 mg by mouth 2 (two) times daily with a meal. Patient not taking: Reported on 08/08/2023    [provider]  citalopram (CELEXA) 10 MG tablet Take 1 tablet (10 mg total) by mouth daily. 08/08/23   Drema Dallas, DO  clotrimazole (LOTRIMIN) 1 % cream Apply topically under breasts daily PRN 04/09/22   Pincus Sanes, MD  DULoxetine (CYMBALTA) 60 MG capsule TAKE ONE CAPSULE BY MOUTH DAILY AT 9AM Patient not taking: Reported on 08/08/2023 01/30/23   Pincus Sanes, MD  Elastic Bandages & Supports (MEDICAL COMPRESSION SOCKS) MISC Compression sock, medium compression 20-30 mmHg. Use daily for venous insufficiency I87.2 03/08/22   Pincus Sanes, MD  fluticasone (FLONASE) 50 MCG/ACT nasal spray Place 2 sprays into both nostrils daily. 08/01/23   Pincus Sanes, MD  folic acid (FOLVITE) 1 MG tablet TAKE ONE TABLET BY MOUTH DAILY AT 9AM 04/05/23   Pincus Sanes, MD  furosemide (LASIX)  20 MG tablet TAKE ONE TABLET BY MOUTH DAILY AT 9AM 10/30/22   Pincus Sanes, MD  glucose blood (ACCU-CHEK GUIDE) test strip Use to check sugars once daily. Dx Code-R73.03 11/11/19   Pincus Sanes, MD  hydrocortisone-pramoxine [facility] SINGLES) 2.5-1 % rectal cream Place 1 Application rectally 3 (three) times daily. 04/09/22   Pincus Sanes, MD  Incontinence Supply Disposable (COMFORT PROTECT ADULT DIAPER/L) MISC Use as directed for urinary incontinence 07/03/21   Pincus Sanes, MD  linaclotide [facility]) 72 MCG capsule Take 1 capsule (72 mcg total) by mouth daily before breakfast. For constipation 07/29/23   Pincus Sanes, MD  Olopatadine HCl 0.2 % SOLN INSTILL 1 DROP INTO EACH EYE ONCE DAILY Patient not taking: Reported on 08/08/2023    [provider]  omeprazole (PRILOSEC) 20 MG capsule Take 1 capsule (20 mg total) by mouth daily. 03/27/23   Burns, Bobette Mo, MD  OZEMPIC, 1 MG/DOSE, 4 MG/3ML SOPN INJECT 1 MG INTO THE SKIN ONCE A WEEK 09/09/23   Burns, Bobette Mo, MD  polyethylene glycol powder (GLYCOLAX/MIRALAX) 17 GM/SCOOP powder Take 17 g by mouth daily as needed for moderate constipation. 11/23/21   Pincus Sanes, MD  propranolol (INDERAL) 40 MG tablet Take 1 tablet (40 mg total) by mouth 2 (two) times daily. 07/29/23   Pincus Sanes, MD  rivastigmine (EXELON) 6 MG capsule Take 1 capsule (6 mg total) by mouth 2 (two) times daily. 08/08/23   Everlena Cooper, Adam R, DO  Suvorexant (BELSOMRA) 15 MG TABS Take 1 tablet (15 mg total) by mouth at bedtime as needed. 08/01/23   Pincus Sanes, MD  telmisartan (MICARDIS) 80 MG tablet TAKE ONE TABLET BY MOUTH DAILY AT 9AM (ORIG) 10/01/22   Burns, Bobette Mo, MD  triamcinolone cream (KENALOG) 0.1 % Apply 1 Application topically 2 (two) times daily. Use for up to 14 days at a time on rash 03/27/23   Pincus Sanes, MD    Physical Exam: BP 102/65 (BP Location: Right Arm)   Pulse 61   Temp 97.8 F (36.6 C) (Oral)   Resp  16   Ht 5\' 2"  (1.575 m)   Wt 92.5 kg   SpO2 99%    BMI 37.30 kg/m   General: 68 y.o. year-old female well developed well nourished in no acute distress.  Alert and confused in the setting of Lewy body dementia. Cardiovascular: Regular rate and rhythm with no rubs or gallops.  No thyromegaly or JVD noted.  No lower extremity edema. 2/4 pulses in all 4 extremities. Respiratory: Clear to auscultation with no wheezes or rales.  Poor inspiratory effort. Abdomen: Soft nontender nondistended with normal bowel sounds x4 quadrants. Muskuloskeletal: No cyanosis, clubbing or edema noted bilaterally Neuro: CN II-XII intact, strength, sensation, reflexes Skin: No ulcerative lesions noted or rashes Psychiatry: Judgement and insight appear altered. Mood is appropriate for condition and setting          Labs on Admission:  Basic Metabolic Panel: Recent Labs  Lab 09/13/23 1818  NA 140  K 2.9*  CL 104  CO2 30  GLUCOSE 87  BUN 13  CREATININE 1.44*  CALCIUM 10.0   Liver Function Tests: Recent Labs  Lab 09/13/23 1818  AST 42*  ALT 18  ALKPHOS 82  BILITOT 0.7  PROT 6.9  ALBUMIN 3.7   No results for input(s): "LIPASE", "AMYLASE" in the last 168 hours. No results for input(s): "AMMONIA" in the last 168 hours. CBC: Recent Labs  Lab 09/13/23 1818  WBC 11.9*  HGB 14.1  HCT 42.8  MCV 97.1  PLT 131*   Cardiac Enzymes: No results for input(s): "CKTOTAL", "CKMB", "CKMBINDEX", "TROPONINI" in the last 168 hours.  BNP (last 3 results) No results for input(s): "BNP" in the last 8760 hours.  ProBNP (last 3 results) No results for input(s): "PROBNP" in the last 8760 hours.  CBG: No results for input(s): "GLUCAP" in the last 168 hours.  Radiological Exams on Admission: CT Cervical Spine Wo Contrast Result Date: 09/13/2023 CLINICAL DATA:  Head and neck trauma. EXAM: CT HEAD WITHOUT CONTRAST CT CERVICAL SPINE WITHOUT CONTRAST TECHNIQUE: Multidetector CT imaging of the head and cervical spine was performed following the standard protocol  without intravenous contrast. Multiplanar CT image reconstructions of the cervical spine were also generated. RADIATION DOSE REDUCTION: This exam was performed according to the departmental dose-optimization program which includes automated exposure control, adjustment of the mA and/or kV according to patient size and/or use of iterative reconstruction technique. COMPARISON:  None Available. FINDINGS: CT HEAD FINDINGS Brain: No acute intracranial hemorrhage, midline shift or mass effect is seen. No extra-axial fluid collection. Mild periventricular white matter hypodensities are present bilaterally. No hydrocephalus. Vascular: No hyperdense vessel or unexpected calcification. Skull: Normal. Negative for fracture or focal lesion. Sinuses/Orbits: Mucosal thickening is noted in the right maxillary sinus. No acute orbital abnormality. Other: None. CT CERVICAL SPINE FINDINGS Alignment: Normal. Skull base and vertebrae: No acute fracture. No primary bone lesion or focal pathologic process. Soft tissues and spinal canal: No prevertebral fluid or swelling. No visible canal hematoma. Disc levels: Multilevel intervertebral disc space narrowing, degenerative endplate changes, and facet arthropathy, most pronounced from C4-C6. Upper chest: No acute abnormality. Other: None. IMPRESSION: 1. No acute intracranial process. 2. Multilevel degenerative changes in the cervical spine without evidence of acute fracture. Electronically Signed   By: Thornell Sartorius M.D.   On: 09/13/2023 20:20   CT Head Wo Contrast Result Date: 09/13/2023 CLINICAL DATA:  Head and neck trauma. EXAM: CT HEAD WITHOUT CONTRAST CT CERVICAL SPINE WITHOUT CONTRAST TECHNIQUE: Multidetector CT imaging of the head and cervical  spine was performed following the standard protocol without intravenous contrast. Multiplanar CT image reconstructions of the cervical spine were also generated. RADIATION DOSE REDUCTION: This exam was performed according to the departmental  dose-optimization program which includes automated exposure control, adjustment of the mA and/or kV according to patient size and/or use of iterative reconstruction technique. COMPARISON:  None Available. FINDINGS: CT HEAD FINDINGS Brain: No acute intracranial hemorrhage, midline shift or mass effect is seen. No extra-axial fluid collection. Mild periventricular white matter hypodensities are present bilaterally. No hydrocephalus. Vascular: No hyperdense vessel or unexpected calcification. Skull: Normal. Negative for fracture or focal lesion. Sinuses/Orbits: Mucosal thickening is noted in the right maxillary sinus. No acute orbital abnormality. Other: None. CT CERVICAL SPINE FINDINGS Alignment: Normal. Skull base and vertebrae: No acute fracture. No primary bone lesion or focal pathologic process. Soft tissues and spinal canal: No prevertebral fluid or swelling. No visible canal hematoma. Disc levels: Multilevel intervertebral disc space narrowing, degenerative endplate changes, and facet arthropathy, most pronounced from C4-C6. Upper chest: No acute abnormality. Other: None. IMPRESSION: 1. No acute intracranial process. 2. Multilevel degenerative changes in the cervical spine without evidence of acute fracture. Electronically Signed   By: Thornell Sartorius M.D.   On: 09/13/2023 20:20   DG Chest 2 View Result Date: 09/13/2023 CLINICAL DATA:  Fall, weakness, cough. EXAM: CHEST - 2 VIEW COMPARISON:  01/03/2021 FINDINGS: The cardiomediastinal contours are normal. Upper normal heart size. The lungs are clear. Pulmonary vasculature is normal. No consolidation, pleural effusion, or pneumothorax. No acute osseous abnormalities are seen. IMPRESSION: No acute findings. Electronically Signed   By: Narda Rutherford M.D.   On: 09/13/2023 19:42    EKG: I independently viewed the EKG done and my findings are as followed: None available at the time of this visit.  Assessment/Plan Present on Admission:  AMS (altered mental  status)  Principal Problem:   AMS (altered mental status)  Acute metabolic encephalopathy suspect secondary to presumed UTI, POA In the setting of Lewy body dementia with behavioral disturbance Continue to treat underlying conditions Reorient as needed Fall and delirium precautions. High flight risk, one-to-one sitter for patient's own safety.  Presumptive UTI, POA Continue Rocephin initiated in the ER Follow urine culture for ID and sensitivities. Gentle IV fluid hydration  AKI, suspect multifactorial At baseline creatinine 0.9 with GFR grain 60 Presented with creatinine of 1.44 with GFR 40 Hold off home ARB and home diuretic for now Avoid nephrotoxic agents, dehydration, and hypotension Gentle IV fluid hydration Monitor urine output Repeat CMP in the morning.  Hypokalemia Presented with serum potassium of 2.9 Repleted intravenously and orally. Repeat BMP in the morning Obtain magnesium level  Generalized weakness Early mobilization PT OT assessment Fall precautions  Isolated elevated AST, unclear etiology AST 42, ALT 18 Monitor for now  Hyperlipidemia Resume home Lipitor  Chronic anxiety Resume home Celexa    Critical care time: 65 minutes.    DVT prophylaxis: Subcu Lovenox daily.  Code Status: Full code  Family Communication: Updated the patient's daughter at bedside  Disposition Plan: Admitted to telemetry medical unit  Consults called: None.  Admission status: Inpatient status.   Status is: Inpatient The patient requires at least 2 midnights for further evaluation and treatment of present condition.   Darlin Drop MD [facility] Pager [phone]  If 7PM-7AM, please contact night-coverage [email or web address] Password [facility]  09/13/2023, 11:30 PM

## 2023-09-14 DIAGNOSIS — G3183 Dementia with Lewy bodies: Secondary | ICD-10-CM | POA: Diagnosis not present

## 2023-09-14 DIAGNOSIS — F02B18 Dementia in other diseases classified elsewhere, moderate, with other behavioral disturbance: Secondary | ICD-10-CM | POA: Diagnosis not present

## 2023-09-14 LAB — HIV ANTIBODY (ROUTINE TESTING W REFLEX): HIV Screen 4th Generation wRfx: NONREACTIVE

## 2023-09-14 LAB — COMPREHENSIVE METABOLIC PANEL
ALT: 19 U/L (ref 0–44)
AST: 44 U/L — ABNORMAL HIGH (ref 15–41)
Albumin: 3.2 g/dL — ABNORMAL LOW (ref 3.5–5.0)
Alkaline Phosphatase: 73 U/L (ref 38–126)
Anion gap: 9 (ref 5–15)
BUN: 12 mg/dL (ref 8–23)
CO2: 23 mmol/L (ref 22–32)
Calcium: 9.5 mg/dL (ref 8.9–10.3)
Chloride: 107 mmol/L (ref 98–111)
Creatinine, Ser: 1.15 mg/dL — ABNORMAL HIGH (ref 0.44–1.00)
GFR, Estimated: 52 mL/min — ABNORMAL LOW (ref >60)
Glucose, Bld: 78 mg/dL (ref 70–99)
Potassium: 3.5 mmol/L (ref 3.5–5.1)
Sodium: 139 mmol/L (ref 135–145)
Total Bilirubin: 0.8 mg/dL (ref 0.0–1.2)
Total Protein: 6.2 g/dL — ABNORMAL LOW (ref 6.5–8.1)

## 2023-09-14 LAB — CBC
HCT: 40.5 % (ref 36.0–46.0)
Hemoglobin: 12.9 g/dL (ref 12.0–15.0)
MCH: 32.1 pg (ref 26.0–34.0)
MCHC: 31.9 g/dL (ref 30.0–36.0)
MCV: 100.7 fL — ABNORMAL HIGH (ref 80.0–100.0)
Platelets: 111 10*3/uL — ABNORMAL LOW (ref 150–400)
RBC: 4.02 MIL/uL (ref 3.87–5.11)
RDW: 14.2 % (ref 11.5–15.5)
WBC: 10.2 10*3/uL (ref 4.0–10.5)
nRBC: 0 % (ref 0.0–0.2)

## 2023-09-14 LAB — PHOSPHORUS: Phosphorus: 3.3 mg/dL (ref 2.5–4.6)

## 2023-09-14 LAB — MAGNESIUM: Magnesium: 1.8 mg/dL (ref 1.7–2.4)

## 2023-09-14 LAB — CK: Total CK: 1076 U/L — ABNORMAL HIGH (ref 38–234)

## 2023-09-14 MED ORDER — FOLIC ACID 1 MG PO TABS
1.0000 mg | ORAL_TABLET | Freq: Every day | ORAL | Status: DC
  Administered 2023-09-14 – 2023-09-16 (×3): 1 mg via ORAL
  Filled 2023-09-14 (×3): qty 1

## 2023-09-14 MED ORDER — FLUTICASONE PROPIONATE 50 MCG/ACT NA SUSP
2.0000 | Freq: Every day | NASAL | Status: DC
  Administered 2023-09-15 – 2023-09-16 (×2): 2 via NASAL
  Filled 2023-09-14 (×2): qty 16

## 2023-09-14 MED ORDER — RIVASTIGMINE TARTRATE 1.5 MG PO CAPS
6.0000 mg | ORAL_CAPSULE | Freq: Two times a day (BID) | ORAL | Status: DC
  Administered 2023-09-14 – 2023-09-16 (×5): 6 mg via ORAL
  Filled 2023-09-14 (×7): qty 4

## 2023-09-14 MED ORDER — SODIUM CHLORIDE 0.9 % IV SOLN
1.0000 g | INTRAVENOUS | Status: DC
  Administered 2023-09-14 – 2023-09-15 (×2): 1 g via INTRAVENOUS
  Filled 2023-09-14 (×2): qty 10

## 2023-09-14 MED ORDER — CITALOPRAM HYDROBROMIDE 20 MG PO TABS
10.0000 mg | ORAL_TABLET | Freq: Every day | ORAL | Status: DC
  Administered 2023-09-14 – 2023-09-15 (×2): 10 mg via ORAL
  Filled 2023-09-14 (×2): qty 1

## 2023-09-14 MED ORDER — ATORVASTATIN CALCIUM 10 MG PO TABS
10.0000 mg | ORAL_TABLET | Freq: Every day | ORAL | Status: DC
  Administered 2023-09-14 – 2023-09-16 (×3): 10 mg via ORAL
  Filled 2023-09-14 (×3): qty 1

## 2023-09-14 NOTE — Progress Notes (Signed)
 PROGRESS NOTE  Janet Le  HQI:[id number] DOB: [birth year] DOA: 09/13/2023 PCP: Pincus Sanes, MD  Consultants  Brief Narrative: 68 y.o. female with medical history significant for Lewy body dementia with mood disturbance, essential hypertension, type 2 diabetes, hyperlipidemia, generalized anxiety disorder, GERD, insomnia, who presents to the ER from home due to altered mental status, confusion, visual hallucinations, worse than her baseline.  Associated with generalized weakness that started night prior to admission.  Still with some weakness but this is better.  However still has ongoing paranoia.  Hallucinations have stopped.   Assessment & Plan: Acute metabolic encephalopathy suspect secondary to presumed UTI in the setting of Lewy body dementia with behavioral disturbance Continue to treat underlying conditions Reorient as needed Fall and delirium precautions. High flight risk, one-to-one sitter for patient's own safety. Need to discuss with family her mental baseline   Presumptive UTI Continue Rocephin initiated in the ER Follow urine culture for ID and sensitivities. Gentle IV fluid hydration   AKI, suspect multifactorial At baseline creatinine 0.9 with GFR grain 60 Presented with creatinine of 1.44 with GFR 40 Downtrending   Hypokalemia Resolved, will follow   Generalized weakness Early mobilization PT OT assessment Fall precautions   Isolated elevated AST, unclear etiology AST 42, ALT 18 Monitor for now   Hyperlipidemia Resume home Lipitor   Chronic anxiety Resume home Celexa.    DVT prophylaxis:  enoxaparin (LOVENOX) injection 40 mg Start: 09/14/23 1000  Code Status:   Code Status: Full Code Level of care: Telemetry Medical Status is: Inpatient  Subjective: Patient continues to feel unsure about her surroundings.  Endorses being frightened and confused.  Does know that she is in the hospital and that she is here because of her confusion but does  not know why she is confused.  Denies any pain.  No dysuria today.  No other concerns or complaints.  Objective: Vitals:   09/14/23 1300 09/14/23 1314 09/14/23 1404 09/14/23 1651  BP:  (!) 130/55 (!) 148/63 (!) 94/52  Pulse:   (!) 57 82  Resp:  14 17 17   Temp: 97.8 F (36.6 C)  97.6 F (36.4 C) 97.8 F (36.6 C)  TempSrc: Oral  Oral   SpO2:   99% 100%  Weight:      Height:        Intake/Output Summary (Last 24 hours) at 09/14/2023 1753 Last data filed at 09/14/2023 1644 Gross per 24 hour  Intake 434.02 ml  Output --  Net 434.02 ml   Filed Weights   09/13/23 1814  Weight: 92.5 kg   Body mass index is 37.3 kg/m.  Gen: 68 y.o. female in no apparent distress.  Nontoxic Pulm: Non-labored breathing.  Clear to auscultation bilaterally.  CV: Regular rate and rhythm. No murmur, rub, or gallop. No JVD GI: Abdomen soft, non-tender, non-distended, with normoactive bowel sounds. No organomegaly or masses felt. Ext: Warm, no deformities, no pedal edema Skin: No rashes, lesions no ulcers Neuro: Alert and oriented. No focal neurological deficits. Psych: Calm but endorses being frightened.  Denies any current auditory or visual hallucinations.  No SI/   I have personally reviewed the following labs and images: CBC: Recent Labs  Lab 09/13/23 1818 09/14/23 0526  WBC 11.9* 10.2  HGB 14.1 12.9  HCT 42.8 40.5  MCV 97.1 100.7*  PLT 131* 111*   BMP &GFR Recent Labs  Lab 09/13/23 1818 09/14/23 0526  NA 140 139  K 2.9* 3.5  CL 104 107  CO2 30  23  GLUCOSE 87 78  BUN 13 12  CREATININE 1.44* 1.15*  CALCIUM 10.0 9.5  MG  --  1.8  PHOS  --  3.3   Estimated Creatinine Clearance: 49.6 mL/min (A) (by C-G formula based on SCr of 1.15 mg/dL (H)). Liver & Pancreas: Recent Labs  Lab 09/13/23 1818 09/14/23 0526  AST 42* 44*  ALT 18 19  ALKPHOS 82 73  BILITOT 0.7 0.8  PROT 6.9 6.2*  ALBUMIN 3.7 3.2*   No results for input(s): "LIPASE", "AMYLASE" in the last 168 hours. No  results for input(s): "AMMONIA" in the last 168 hours. Diabetic: No results for input(s): "HGBA1C" in the last 72 hours. No results for input(s): "GLUCAP" in the last 168 hours. Cardiac Enzymes: Recent Labs  Lab 09/13/23 1818  CKTOTAL 1,076*   No results for input(s): "PROBNP" in the last 8760 hours. Coagulation Profile: No results for input(s): "INR", "PROTIME" in the last 168 hours. Thyroid Function Tests: Recent Labs    09/13/23 1818  TSH 1.819   Lipid Profile: No results for input(s): "CHOL", "HDL", "LDLCALC", "TRIG", "CHOLHDL", "LDLDIRECT" in the last 72 hours. Anemia Panel: No results for input(s): "VITAMINB12", "FOLATE", "FERRITIN", "TIBC", "IRON", "RETICCTPCT" in the last 72 hours. Urine analysis:    Component Value Date/Time   COLORURINE ORANGE (A) 09/13/2023 1818   APPEARANCEUR CLOUDY (A) 09/13/2023 1818   LABSPEC >1.030 (H) 09/13/2023 1818   PHURINE 6.0 09/13/2023 1818   GLUCOSEU 100 (A) 09/13/2023 1818   GLUCOSEU NEGATIVE 04/21/2021 1540   HGBUR TRACE (A) 09/13/2023 1818   BILIRUBINUR MODERATE (A) 09/13/2023 1818   BILIRUBINUR neg 11/05/2013 0929   KETONESUR NEGATIVE 09/13/2023 1818   PROTEINUR 100 (A) 09/13/2023 1818   UROBILINOGEN 1.0 04/21/2021 1540   NITRITE POSITIVE (A) 09/13/2023 1818   LEUKOCYTESUR TRACE (A) 09/13/2023 1818   Sepsis Labs: Invalid input(s): "PROCALCITONIN", "LACTICIDVEN"  Microbiology: Recent Results (from the past 240 hours)  Resp panel by RT-PCR (RSV, Flu A&B, Covid) Anterior Nasal Swab     Status: None   Collection Time: 09/13/23  6:18 PM   Specimen: Anterior Nasal Swab  Result Value Ref Range Status   SARS Coronavirus 2 by RT PCR NEGATIVE NEGATIVE Final   Influenza A by PCR NEGATIVE NEGATIVE Final   Influenza B by PCR NEGATIVE NEGATIVE Final    Comment: (NOTE) The Xpert Xpress SARS-CoV-2/FLU/RSV plus assay is intended as an aid in the diagnosis of influenza from Nasopharyngeal swab specimens and should not be used as a  sole basis for treatment. Nasal washings and aspirates are unacceptable for Xpert Xpress SARS-CoV-2/FLU/RSV testing.  Fact Sheet for Patients: BloggerCourse.com  Fact Sheet for Healthcare Providers: SeriousBroker.it  This test is not yet approved or cleared by the [place] FDA and has been authorized for detection and/or diagnosis of SARS-CoV-2 by FDA under an Emergency Use Authorization (EUA). This EUA will remain in effect (meaning this test can be used) for the duration of the COVID-19 declaration under Section 564(b)(1) of the Act, 21 U.S.C. section 360bbb-3(b)(1), unless the authorization is terminated or revoked.     Resp Syncytial Virus by PCR NEGATIVE NEGATIVE Final    Comment: (NOTE) Fact Sheet for Patients: BloggerCourse.com  Fact Sheet for Healthcare Providers: SeriousBroker.it  This test is not yet approved or cleared by the [place] FDA and has been authorized for detection and/or diagnosis of SARS-CoV-2 by FDA under an Emergency Use Authorization (EUA). This EUA will remain in effect (meaning this test can be used) for  the duration of the COVID-19 declaration under Section 564(b)(1) of the Act, 21 U.S.C. section 360bbb-3(b)(1), unless the authorization is terminated or revoked.  Performed at [facility], [address] [place], [place] [address]     Radiology Studies: CT Cervical Spine Wo Contrast Result Date: 09/13/2023 CLINICAL DATA:  Head and neck trauma. EXAM: CT HEAD WITHOUT CONTRAST CT CERVICAL SPINE WITHOUT CONTRAST TECHNIQUE: Multidetector CT imaging of the head and cervical spine was performed following the standard protocol without intravenous contrast. Multiplanar CT image reconstructions of the cervical spine were also generated. RADIATION DOSE REDUCTION: This exam was performed according to the departmental dose-optimization program  which includes automated exposure control, adjustment of the mA and/or kV according to patient size and/or use of iterative reconstruction technique. COMPARISON:  None Available. FINDINGS: CT HEAD FINDINGS Brain: No acute intracranial hemorrhage, midline shift or mass effect is seen. No extra-axial fluid collection. Mild periventricular white matter hypodensities are present bilaterally. No hydrocephalus. Vascular: No hyperdense vessel or unexpected calcification. Skull: Normal. Negative for fracture or focal lesion. Sinuses/Orbits: Mucosal thickening is noted in the right maxillary sinus. No acute orbital abnormality. Other: None. CT CERVICAL SPINE FINDINGS Alignment: Normal. Skull base and vertebrae: No acute fracture. No primary bone lesion or focal pathologic process. Soft tissues and spinal canal: No prevertebral fluid or swelling. No visible canal hematoma. Disc levels: Multilevel intervertebral disc space narrowing, degenerative endplate changes, and facet arthropathy, most pronounced from C4-C6. Upper chest: No acute abnormality. Other: None. IMPRESSION: 1. No acute intracranial process. 2. Multilevel degenerative changes in the cervical spine without evidence of acute fracture. Electronically Signed   By: Thornell Sartorius M.D.   On: 09/13/2023 20:20   CT Head Wo Contrast Result Date: 09/13/2023 CLINICAL DATA:  Head and neck trauma. EXAM: CT HEAD WITHOUT CONTRAST CT CERVICAL SPINE WITHOUT CONTRAST TECHNIQUE: Multidetector CT imaging of the head and cervical spine was performed following the standard protocol without intravenous contrast. Multiplanar CT image reconstructions of the cervical spine were also generated. RADIATION DOSE REDUCTION: This exam was performed according to the departmental dose-optimization program which includes automated exposure control, adjustment of the mA and/or kV according to patient size and/or use of iterative reconstruction technique. COMPARISON:  None Available. FINDINGS: CT  HEAD FINDINGS Brain: No acute intracranial hemorrhage, midline shift or mass effect is seen. No extra-axial fluid collection. Mild periventricular white matter hypodensities are present bilaterally. No hydrocephalus. Vascular: No hyperdense vessel or unexpected calcification. Skull: Normal. Negative for fracture or focal lesion. Sinuses/Orbits: Mucosal thickening is noted in the right maxillary sinus. No acute orbital abnormality. Other: None. CT CERVICAL SPINE FINDINGS Alignment: Normal. Skull base and vertebrae: No acute fracture. No primary bone lesion or focal pathologic process. Soft tissues and spinal canal: No prevertebral fluid or swelling. No visible canal hematoma. Disc levels: Multilevel intervertebral disc space narrowing, degenerative endplate changes, and facet arthropathy, most pronounced from C4-C6. Upper chest: No acute abnormality. Other: None. IMPRESSION: 1. No acute intracranial process. 2. Multilevel degenerative changes in the cervical spine without evidence of acute fracture. Electronically Signed   By: Thornell Sartorius M.D.   On: 09/13/2023 20:20   DG Chest 2 View Result Date: 09/13/2023 CLINICAL DATA:  Fall, weakness, cough. EXAM: CHEST - 2 VIEW COMPARISON:  01/03/2021 FINDINGS: The cardiomediastinal contours are normal. Upper normal heart size. The lungs are clear. Pulmonary vasculature is normal. No consolidation, pleural effusion, or pneumothorax. No acute osseous abnormalities are seen. IMPRESSION: No acute findings. Electronically Signed   By:  Narda Rutherford M.D.   On: 09/13/2023 19:42    Scheduled Meds:  atorvastatin  10 mg Oral Daily   citalopram  10 mg Oral Daily   enoxaparin (LOVENOX) injection  40 mg Subcutaneous Q24H   fluticasone  2 spray Each Nare Daily   folic acid  1 mg Oral Daily   rivastigmine  6 mg Oral BID WC   Continuous Infusions:  cefTRIAXone (ROCEPHIN)  IV     lactated ringers 1,000 mL with potassium chloride 20 mEq infusion 50 mL/hr at 09/14/23 1401      LOS: 1 day   35 minutes with more than 50% spent in reviewing records, counseling patient/family and coordinating care.  Tobey Grim, MD [facility] [email or web address] 09/14/2023, 5:53 PM

## 2023-09-14 NOTE — Progress Notes (Signed)
 Patient arrived in the room from ED at 1350 PM. Alert and oriented. On RA. LR is being infused. Will continue to monitor

## 2023-09-15 DIAGNOSIS — F02B18 Dementia in other diseases classified elsewhere, moderate, with other behavioral disturbance: Secondary | ICD-10-CM | POA: Diagnosis not present

## 2023-09-15 DIAGNOSIS — G3183 Dementia with Lewy bodies: Secondary | ICD-10-CM | POA: Diagnosis not present

## 2023-09-15 LAB — BASIC METABOLIC PANEL
Anion gap: 5 (ref 5–15)
BUN: 11 mg/dL (ref 8–23)
CO2: 26 mmol/L (ref 22–32)
Calcium: 9.2 mg/dL (ref 8.9–10.3)
Chloride: 109 mmol/L (ref 98–111)
Creatinine, Ser: 0.91 mg/dL (ref 0.44–1.00)
GFR, Estimated: >60 mL/min (ref >60)
Glucose, Bld: 88 mg/dL (ref 70–99)
Potassium: 3.6 mmol/L (ref 3.5–5.1)
Sodium: 140 mmol/L (ref 135–145)

## 2023-09-15 LAB — CBC
HCT: 35.4 % — ABNORMAL LOW (ref 36.0–46.0)
Hemoglobin: 11.8 g/dL — ABNORMAL LOW (ref 12.0–15.0)
MCH: 32.2 pg (ref 26.0–34.0)
MCHC: 33.3 g/dL (ref 30.0–36.0)
MCV: 96.5 fL (ref 80.0–100.0)
Platelets: 112 10*3/uL — ABNORMAL LOW (ref 150–400)
RBC: 3.67 MIL/uL — ABNORMAL LOW (ref 3.87–5.11)
RDW: 14.3 % (ref 11.5–15.5)
WBC: 7.4 10*3/uL (ref 4.0–10.5)
nRBC: 0 % (ref 0.0–0.2)

## 2023-09-15 LAB — URINE CULTURE

## 2023-09-15 MED ORDER — POLYETHYLENE GLYCOL 3350 17 G PO PACK
17.0000 g | PACK | Freq: Every day | ORAL | Status: DC
  Administered 2023-09-16: 17 g via ORAL
  Filled 2023-09-15: qty 1

## 2023-09-15 MED ORDER — INFLUENZA VAC A&B SURF ANT ADJ 0.5 ML IM SUSY
0.5000 mL | PREFILLED_SYRINGE | INTRAMUSCULAR | Status: AC
  Administered 2023-09-16: 0.5 mL via INTRAMUSCULAR
  Filled 2023-09-15: qty 0.5

## 2023-09-15 MED ORDER — MELATONIN 5 MG PO TABS
5.0000 mg | ORAL_TABLET | Freq: Every day | ORAL | Status: DC
  Administered 2023-09-15: 5 mg via ORAL
  Filled 2023-09-15: qty 1

## 2023-09-15 MED ORDER — SIMETHICONE 80 MG PO CHEW
80.0000 mg | CHEWABLE_TABLET | Freq: Four times a day (QID) | ORAL | Status: DC | PRN
  Administered 2023-09-15: 80 mg via ORAL
  Filled 2023-09-15: qty 1

## 2023-09-15 NOTE — Progress Notes (Signed)
 PROGRESS NOTE  Janet Le  UJW:[id number] DOB: February 12, 1956 DOA: 09/13/2023 PCP: Pincus Sanes, MD  Consultants  Brief Narrative: 68 y.o. female with medical history significant for Lewy body dementia with mood disturbance, essential hypertension, type 2 diabetes, hyperlipidemia, generalized anxiety disorder, GERD, insomnia, who presents to the ER from home due to altered mental status, confusion, visual hallucinations, worse than her baseline.  Associated with generalized weakness that started night prior to admission.  Still with some weakness but this is better.  However still has ongoing paranoia.  Hallucinations have stopped.   Assessment & Plan: Acute metabolic encephalopathy suspect secondary to presumed UTI in the setting of Lewy body dementia with behavioral disturbance -Daughter present today and was able to provide better history.  Reports that she has had hallucinations since about a week after seeing her neurologist in mid February. -These are mostly daily.  However for the past week prior to admission they became acutely worse. -Patient was also very sleepy the day of admission.  It was her excessive sleepiness on top of the increasing hallucinations that led daughter to bring her in to be seen. -Daughter reports that she is close to her baseline now. -I am concerned that her Celexa has started some worsening of the symptoms as her hallucination onset seems to have worsened after starting the Celexa. -Rivastigmine could also be culprit although she has been on this much longer than the citalopram. -Psychiatry consult placed today for further insight regarding hallucination control in this patient with Lewy body dementia.  Presumptive UTI Continue Rocephin initiated in the ER -Could be contributing to problem #1 above.  Urine culture grew no specific organism.   AKI, suspect multifactorial At baseline creatinine 0.9 with GFR grain 60 Presented with creatinine of 1.44 with  GFR 40 Downtrending   Hypokalemia Resolved, will follow   Generalized weakness Early mobilization PT OT assessment Fall precautions -Patient is moving back towards baseline for her daughter.   Isolated elevated AST, unclear etiology AST 42, ALT 18 Monitor for now   Hyperlipidemia Resume home Lipitor   Chronic anxiety Resume home Celexa.  Constipation: -She is currently on MiraLAX.  Also complaining of excessive gas. -Starting simethicone as well.    DVT prophylaxis:  enoxaparin (LOVENOX) injection 40 mg Start: 09/14/23 1000  Code Status:   Code Status: Full Code Level of care: Telemetry Medical Status is: Inpatient Dispo: Likely able to DC home tomorrow. pending psychiatric recommendations.  Daughter would like to speak to social work regarding home health aides.  Subjective: Patient feels better today.  Still with some confusion.  She is unsure why the nurses keep changing and she has a different nurse coming into her room.  Paranoia is somewhat abating.  Still with a few visual hallucinations.  Objective: Vitals:   09/15/23 0438 09/15/23 0858 09/15/23 1208 09/15/23 1610  BP: 124/87 110/60 (!) 116/51 117/60  Pulse: 95 85 79 79  Resp: 18 20 17 18   Temp: 98 F (36.7 C) 98.5 F (36.9 C) 97.8 F (36.6 C) 97.9 F (36.6 C)  TempSrc: Oral  Oral Oral  SpO2: 98% 99% 97% 98%  Weight:      Height:        Intake/Output Summary (Last 24 hours) at 09/15/2023 1624 Last data filed at 09/15/2023 1209 Gross per 24 hour  Intake 1225 ml  Output --  Net 1225 ml   Filed Weights   09/13/23 1814  Weight: 92.5 kg   Body mass index is 37.3  kg/m.  Gen: 68 y.o. female in no apparent distress.  Nontoxic Pulm: Non-labored breathing.  Clear to auscultation bilaterally.  CV: Regular rate and rhythm. No murmur, rub, or gallop. No JVD GI: Abdomen soft, non-tender, non-distended, with normoactive bowel sounds. No organomegaly or masses felt. Ext: Warm, no deformities, no pedal  edema Skin: No rashes, lesions no ulcers Neuro: Alert and oriented. No focal neurological deficits. Psych: Calm but endorses being frightened.  Some mild visual hallucinations.  No SI/HI   I have personally reviewed the following labs and images: CBC: Recent Labs  Lab 09/13/23 1818 09/14/23 0526 09/15/23 0727  WBC 11.9* 10.2 7.4  HGB 14.1 12.9 11.8*  HCT 42.8 40.5 35.4*  MCV 97.1 100.7* 96.5  PLT 131* 111* 112*   BMP &GFR Recent Labs  Lab 09/13/23 1818 09/14/23 0526 09/15/23 0727  NA 140 139 140  K 2.9* 3.5 3.6  CL 104 107 109  CO2 30 23 26   GLUCOSE 87 78 88  BUN 13 12 11   CREATININE 1.44* 1.15* 0.91  CALCIUM 10.0 9.5 9.2  MG  --  1.8  --   PHOS  --  3.3  --    Estimated Creatinine Clearance: 62.7 mL/min (by C-G formula based on SCr of 0.91 mg/dL). Liver & Pancreas: Recent Labs  Lab 09/13/23 1818 09/14/23 0526  AST 42* 44*  ALT 18 19  ALKPHOS 82 73  BILITOT 0.7 0.8  PROT 6.9 6.2*  ALBUMIN 3.7 3.2*   No results for input(s): "LIPASE", "AMYLASE" in the last 168 hours. No results for input(s): "AMMONIA" in the last 168 hours. Diabetic: No results for input(s): "HGBA1C" in the last 72 hours. No results for input(s): "GLUCAP" in the last 168 hours. Cardiac Enzymes: Recent Labs  Lab 09/13/23 1818  CKTOTAL 1,076*   No results for input(s): "PROBNP" in the last 8760 hours. Coagulation Profile: No results for input(s): "INR", "PROTIME" in the last 168 hours. Thyroid Function Tests: Recent Labs    09/13/23 1818  TSH 1.819   Lipid Profile: No results for input(s): "CHOL", "HDL", "LDLCALC", "TRIG", "CHOLHDL", "LDLDIRECT" in the last 72 hours. Anemia Panel: No results for input(s): "VITAMINB12", "FOLATE", "FERRITIN", "TIBC", "IRON", "RETICCTPCT" in the last 72 hours. Urine analysis:    Component Value Date/Time   COLORURINE ORANGE (A) 09/13/2023 1818   APPEARANCEUR CLOUDY (A) 09/13/2023 1818   LABSPEC >1.030 (H) 09/13/2023 1818   PHURINE 6.0  09/13/2023 1818   GLUCOSEU 100 (A) 09/13/2023 1818   GLUCOSEU NEGATIVE 04/21/2021 1540   HGBUR TRACE (A) 09/13/2023 1818   BILIRUBINUR MODERATE (A) 09/13/2023 1818   BILIRUBINUR neg 11/05/2013 0929   KETONESUR NEGATIVE 09/13/2023 1818   PROTEINUR 100 (A) 09/13/2023 1818   UROBILINOGEN 1.0 04/21/2021 1540   NITRITE POSITIVE (A) 09/13/2023 1818   LEUKOCYTESUR TRACE (A) 09/13/2023 1818   Sepsis Labs: Invalid input(s): "PROCALCITONIN", "LACTICIDVEN"  Microbiology: Recent Results (from the past 240 hours)  Resp panel by RT-PCR (RSV, Flu A&B, Covid) Anterior Nasal Swab     Status: None   Collection Time: 09/13/23  6:18 PM   Specimen: Anterior Nasal Swab  Result Value Ref Range Status   SARS Coronavirus 2 by RT PCR NEGATIVE NEGATIVE Final   Influenza A by PCR NEGATIVE NEGATIVE Final   Influenza B by PCR NEGATIVE NEGATIVE Final    Comment: (NOTE) The Xpert Xpress SARS-CoV-2/FLU/RSV plus assay is intended as an aid in the diagnosis of influenza from Nasopharyngeal swab specimens and should not be used as a  sole basis for treatment. Nasal washings and aspirates are unacceptable for Xpert Xpress SARS-CoV-2/FLU/RSV testing.  Fact Sheet for Patients: BloggerCourse.com  Fact Sheet for Healthcare Providers: SeriousBroker.it  This test is not yet approved or cleared by the [place] FDA and has been authorized for detection and/or diagnosis of SARS-CoV-2 by FDA under an Emergency Use Authorization (EUA). This EUA will remain in effect (meaning this test can be used) for the duration of the COVID-19 declaration under Section 564(b)(1) of the Act, 21 U.S.C. section 360bbb-3(b)(1), unless the authorization is terminated or revoked.     Resp Syncytial Virus by PCR NEGATIVE NEGATIVE Final    Comment: (NOTE) Fact Sheet for Patients: BloggerCourse.com  Fact Sheet for Healthcare  Providers: SeriousBroker.it  This test is not yet approved or cleared by the [place] FDA and has been authorized for detection and/or diagnosis of SARS-CoV-2 by FDA under an Emergency Use Authorization (EUA). This EUA will remain in effect (meaning this test can be used) for the duration of the COVID-19 declaration under Section 564(b)(1) of the Act, 21 U.S.C. section 360bbb-3(b)(1), unless the authorization is terminated or revoked.  Performed at [facility], [address], [place], [place] [address]   Urine Culture     Status: Abnormal   Collection Time: 09/13/23  8:33 PM   Specimen: Urine, Clean Catch  Result Value Ref Range Status   Specimen Description URINE, CLEAN CATCH  Final   Special Requests   Final    NONE Performed at [facility], [address], [place], [place] [address]    Culture MULTIPLE SPECIES PRESENT, SUGGEST RECOLLECTION (A)  Final   Report Status 09/15/2023 FINAL  Final    Radiology Studies: No results found.   Scheduled Meds:  atorvastatin  10 mg Oral Daily   enoxaparin (LOVENOX) injection  40 mg Subcutaneous Q24H   fluticasone  2 spray Each Nare Daily   folic acid  1 mg Oral Daily   [START ON 09/16/2023] influenza vaccine adjuvanted  0.5 mL Intramuscular Tomorrow-1000   melatonin  5 mg Oral QHS   polyethylene glycol  17 g Oral Daily   rivastigmine  6 mg Oral BID WC   Continuous Infusions:  cefTRIAXone (ROCEPHIN)  IV 1 g (09/14/23 2139)     LOS: 2 days   35 minutes with more than 50% spent in reviewing records, counseling patient/family and coordinating care.  Tobey Grim, MD [facility] [email or web address] 09/15/2023, 4:24 PM

## 2023-09-15 NOTE — Evaluation (Signed)
 Physical Therapy Evaluation Patient Details Name: Janet Le MRN: [id number] DOB: [birth year] Today's Date: 09/15/2023  History of Present Illness  Pt is 68 yo presenting to [facility] on 3/21 Due to altered mental status, confusion and visual hallucinations that are worse than her baseline with generalized weakness. PMH: Lewy body dementia with mood disturbance, HTN, DM II, hyperlipidemia, generalized anxiety disorder, GERD, insomnia.  Clinical Impression  Pt is presenting at Mod I for bed mobility, sit to stand, gait and CGA for stairs per daughters home setup. Pt uses a rollator at home. Pt has family that assists with transportation and intermittently at home. Currently pt is presenting at baseline level of functioning and no skilled physical therapy services recommended. Pt will be discharged from skilled physical therapy services at this time; please re-consult if further needs arise. Pt would benefit from skilled physical therapy services 3x/weekly on discharge from acute care hospital setting         If plan is discharge home, recommend the following: Help with stairs or ramp for entrance;Assist for transportation;Other (comment) (as needed)     Equipment Recommendations None recommended by PT     Functional Status Assessment Patient has not had a recent decline in their functional status     Precautions / Restrictions Precautions Precautions: Fall Recall of Precautions/Restrictions: Intact Restrictions Weight Bearing Restrictions Per Provider Order: No      Mobility  Bed Mobility Overal bed mobility: Independent      Transfers Overall transfer level: Modified independent Equipment used: Rolling walker (2 wheels)     Ambulation/Gait Ambulation/Gait assistance: Modified independent (Device/Increase time) Gait Distance (Feet): 150 Feet Assistive device: Rolling walker (2 wheels) Gait Pattern/deviations: Step-through pattern Gait velocity: decreased Gait velocity  interpretation: 1.31 - 2.62 ft/sec, indicative of limited community ambulator   General Gait Details: guarded gait, slightly decreased stride length  Stairs Stairs: Yes Stairs assistance: Contact guard assist Stair Management: One rail Right, Step to pattern, Sideways, Forwards Number of Stairs: 4 General stair comments: per daughters home set up forwards ascending, sideways descending.     Balance Overall balance assessment: Mild deficits observed, not formally tested, Modified Independent         Pertinent Vitals/Pain Pain Assessment Pain Assessment: No/denies pain    Home Living Family/patient expects to be discharged to:: Private residence Living Arrangements: Other relatives (grandson comes to assist and goes to daughters house.) Available Help at Discharge: Available 24 hours/day;Family Type of Home: Apartment Home Access: Level entry       Home Layout: One level Home Equipment: Educational psychologist (4 wheels);Cane - single point Additional Comments: 4 steps to get into daughters house with railing on the R going up.    Prior Function Prior Level of Function : Needs assist  Cognitive Assist : ADLs (cognitive)           Mobility Comments: Pt ambulates with rollator. Daughter reports 3 falls in the past 6 months. ADLs Comments: Supervision for bathroom and showers due to fear of falling.     Extremity/Trunk Assessment   Upper Extremity Assessment Upper Extremity Assessment: Generalized weakness    Lower Extremity Assessment Lower Extremity Assessment: Generalized weakness    Cervical / Trunk Assessment Cervical / Trunk Assessment: Normal  Communication   Communication Communication: No apparent difficulties    Cognition Arousal: Alert Behavior During Therapy: WFL for tasks assessed/performed   PT - Cognitive impairments: History of cognitive impairments       PT - Cognition Comments: understands self,  situation able to answer home questions  well Following commands: Intact       Cueing Cueing Techniques: Verbal cues     General Comments General comments (skin integrity, edema, etc.): no noted skin issues outside of gown.        Assessment/Plan    PT Assessment Patient does not need any further PT services         PT Goals (Current goals can be found in the Care Plan section)  Acute Rehab PT Goals PT Goal Formulation: All assessment and education complete, DC therapy     AM-PAC PT "6 Clicks" Mobility  Outcome Measure Help needed turning from your back to your side while in a flat bed without using bedrails?: None Help needed moving from lying on your back to sitting on the side of a flat bed without using bedrails?: None Help needed moving to and from a bed to a chair (including a wheelchair)?: None Help needed standing up from a chair using your arms (e.g., wheelchair or bedside chair)?: None Help needed to walk in hospital room?: None Help needed climbing 3-5 steps with a railing? : A Little 6 Click Score: 23    End of Session Equipment Utilized During Treatment: Gait belt Activity Tolerance: Patient tolerated treatment well Patient left: in bed;with call bell/phone within reach;with family/visitor present Nurse Communication: Mobility status      Time: 1610-9604 PT Time Calculation (min) (ACUTE ONLY): 27 min   Charges:   PT Evaluation $PT Eval Low Complexity: 1 Low PT Treatments $Therapeutic Activity: 8-22 mins PT General Charges $$ ACUTE PT VISIT: 1 Visit       Harrel Carina, DPT, CLT  Acute Rehabilitation Services Office: [phone] (Secure chat preferred)   Claudia Desanctis 09/15/2023, 1:40 PM

## 2023-09-15 NOTE — Plan of Care (Signed)

## 2023-09-15 NOTE — Evaluation (Signed)
 Occupational Therapy Evaluation Patient Details Name: Janet Le MRN: [id number] DOB: [birth year] Today's Date: 09/15/2023   History of Present Illness   Pt is 68 yo presenting to [facility] on 3/21 Due to altered mental status, confusion and visual hallucinations that are worse than her baseline with generalized weakness. PMH: Lewy body dementia with mood disturbance, HTN, DM II, hyperlipidemia, generalized anxiety disorder, GERD, insomnia.     Clinical Impressions Pt reports ind at baseline with ADLs/functional mobility, lives alone but grandson comes over to assist frequently and she also goes between her home and her daughter's home. Pt currently needing up to min A for ADLs, ind with bed mobility and min A for transfers with RW. Pt fatigues quickly. Pt presenting with impairments listed below, will follow acutely. Recommend HHOT at d/c.      If plan is discharge home, recommend the following:   A little help with walking and/or transfers;A little help with bathing/dressing/bathroom;Assistance with cooking/housework;Direct supervision/assist for financial management;Direct supervision/assist for medications management;Assist for transportation;Help with stairs or ramp for entrance     Functional Status Assessment   Patient has had a recent decline in their functional status and demonstrates the ability to make significant improvements in function in a reasonable and predictable amount of time.     Equipment Recommendations   None recommended by OT     Recommendations for Other Services         Precautions/Restrictions   Precautions Precautions: Fall Recall of Precautions/Restrictions: Intact Restrictions Weight Bearing Restrictions Per Provider Order: No     Mobility Bed Mobility Overal bed mobility: Independent                  Transfers Overall transfer level: Needs assistance Equipment used: Rolling walker (2 wheels) Transfers: Sit to/from  Stand Sit to Stand: Min assist                  Balance                                           ADL either performed or assessed with clinical judgement   ADL Overall ADL's : Needs assistance/impaired Eating/Feeding: Supervision/ safety   Grooming: Supervision/safety   Upper Body Bathing: Minimal assistance   Lower Body Bathing: Minimal assistance   Upper Body Dressing : Contact guard assist   Lower Body Dressing: Contact guard assist   Toilet Transfer: Contact guard assist;Ambulation;Regular Toilet;Rolling walker (2 wheels)   Toileting- Clothing Manipulation and Hygiene: Contact guard assist       Functional mobility during ADLs: Contact guard assist;Rolling walker (2 wheels)       Vision   Vision Assessment?: No apparent visual deficits     Perception Perception: Not tested       Praxis Praxis: Not tested       Pertinent Vitals/Pain Pain Assessment Pain Assessment: Faces Pain Score: 2  Faces Pain Scale: Hurts a little bit Pain Location: stomach Pain Descriptors / Indicators: Discomfort Pain Intervention(s): Limited activity within patient's tolerance, Monitored during session, Repositioned     Extremity/Trunk Assessment Upper Extremity Assessment Upper Extremity Assessment: Generalized weakness   Lower Extremity Assessment Lower Extremity Assessment: Defer to PT evaluation   Cervical / Trunk Assessment Cervical / Trunk Assessment: Normal   Communication Communication Communication: No apparent difficulties   Cognition Arousal: Alert Behavior During Therapy: WFL for tasks assessed/performed Cognition:  Cognition impaired             OT - Cognition Comments: slowed processing noted                 Following commands: Intact       Cueing  General Comments   Cueing Techniques: Verbal cues  no noted skin issues outside of gown.   Exercises     Shoulder Instructions      Home Living  Family/patient expects to be discharged to:: Private residence Living Arrangements: Other relatives (sometimes grandson stays and helps, has been staying with her for the most part) Available Help at Discharge: Available 24 hours/day;Family Type of Home: Apartment Home Access: Level entry     Home Layout: One level     Bathroom Shower/Tub: Chief Strategy Officer: Standard Bathroom Accessibility: No   Home Equipment: Educational psychologist (4 wheels);Cane - single point   Additional Comments: 4 steps to get into daughters house with railing on the R going up.      Prior Functioning/Environment Prior Level of Function : Needs assist  Cognitive Assist : ADLs (cognitive)           Mobility Comments: Pt ambulates with rollator. Daughter reports 3 falls in the past 6 months. ADLs Comments: Supervision for bathroom and showers due to fear of falling, manages own meds, reports has some difficulty with IADLs, family provides transportation    OT Problem List: Decreased strength;Decreased range of motion;Decreased activity tolerance;Impaired balance (sitting and/or standing);Decreased cognition;Decreased safety awareness   OT Treatment/Interventions: Self-care/ADL training;Therapeutic exercise;Energy conservation;DME and/or AE instruction;Therapeutic activities;Balance training;Patient/family education      OT Goals(Current goals can be found in the care plan section)   Acute Rehab OT Goals Patient Stated Goal: none stated OT Goal Formulation: With patient Time For Goal Achievement: 09/29/23 Potential to Achieve Goals: Good ADL Goals Pt Will Perform Upper Body Dressing: Independently;sitting Pt Will Perform Lower Body Dressing: Independently;sitting/lateral leans;sit to/from stand Pt Will Transfer to Toilet: Independently;ambulating;regular height toilet Pt Will Perform Tub/Shower Transfer: Tub transfer;Shower transfer;Independently;ambulating;shower seat   OT  Frequency:  Min 2X/week    Co-evaluation              AM-PAC OT "6 Clicks" Daily Activity     Outcome Measure Help from another person eating meals?: None Help from another person taking care of personal grooming?: A Little Help from another person toileting, which includes using toliet, bedpan, or urinal?: A Little Help from another person bathing (including washing, rinsing, drying)?: A Little Help from another person to put on and taking off regular upper body clothing?: A Little Help from another person to put on and taking off regular lower body clothing?: A Little 6 Click Score: 19   End of Session Equipment Utilized During Treatment: Rolling walker (2 wheels) Nurse Communication: Mobility status  Activity Tolerance: Patient tolerated treatment well Patient left: in bed;with call bell/phone within reach;with bed alarm set;with family/visitor present  OT Visit Diagnosis: Unsteadiness on feet (R26.81);Other abnormalities of gait and mobility (R26.89);Muscle weakness (generalized) (M62.81)                Time: 2440-1027 OT Time Calculation (min): 15 min Charges:  OT General Charges $OT Visit: 1 Visit OT Evaluation $OT Eval Low Complexity: 1 Low  Kaisy Severino K, OTD, OTR/L SecureChat Preferred Acute Rehab [phone]   Carver Fila Koonce 09/15/2023, 2:24 PM

## 2023-09-16 ENCOUNTER — Other Ambulatory Visit (HOSPITAL_COMMUNITY): Payer: Self-pay

## 2023-09-16 DIAGNOSIS — G3183 Dementia with Lewy bodies: Secondary | ICD-10-CM | POA: Diagnosis not present

## 2023-09-16 DIAGNOSIS — F0280 Dementia in other diseases classified elsewhere without behavioral disturbance: Secondary | ICD-10-CM

## 2023-09-16 DIAGNOSIS — R4182 Altered mental status, unspecified: Secondary | ICD-10-CM | POA: Diagnosis not present

## 2023-09-16 DIAGNOSIS — F02B18 Dementia in other diseases classified elsewhere, moderate, with other behavioral disturbance: Secondary | ICD-10-CM | POA: Diagnosis not present

## 2023-09-16 LAB — CBC
HCT: 33.7 % — ABNORMAL LOW (ref 36.0–46.0)
Hemoglobin: 11 g/dL — ABNORMAL LOW (ref 12.0–15.0)
MCH: 31.7 pg (ref 26.0–34.0)
MCHC: 32.6 g/dL (ref 30.0–36.0)
MCV: 97.1 fL (ref 80.0–100.0)
Platelets: 107 10*3/uL — ABNORMAL LOW (ref 150–400)
RBC: 3.47 MIL/uL — ABNORMAL LOW (ref 3.87–5.11)
RDW: 14.2 % (ref 11.5–15.5)
WBC: 5.3 10*3/uL (ref 4.0–10.5)
nRBC: 0 % (ref 0.0–0.2)

## 2023-09-16 MED ORDER — TRAZODONE HCL 50 MG PO TABS
100.0000 mg | ORAL_TABLET | Freq: Once | ORAL | Status: AC
  Administered 2023-09-16: 100 mg via ORAL
  Filled 2023-09-16: qty 2

## 2023-09-16 MED ORDER — CEFADROXIL 500 MG PO CAPS
500.0000 mg | ORAL_CAPSULE | Freq: Two times a day (BID) | ORAL | 0 refills | Status: DC
Start: 2023-09-16 — End: 2023-09-20

## 2023-09-16 NOTE — TOC Transition Note (Signed)
 Transition of Care [facility]) - Discharge Note   Patient Details  Name: Janet Le MRN: [id number] Date of Birth: 27-Oct-1955  [facility] CM/SW Contact:  Janae Bridgeman, RN Phone Number: 09/16/2023, 3:03 PM   Clinical Narrative:    CM met with the patient and daughter at the bedside to discuss TOC needs.  The patient plans to discharge home when stable for discharge.  The patient is currently active with [facility].  HH aide was added.  HH orders placed and MD asked to co-sign.  Patient needs 3:1.  [employer] was called to deliver 3:1 to bedside.  Patient's daughter plans to provide transportation to home via car.   Final next level of care: Home w Home Health Services Barriers to Discharge: No Barriers Identified   Patient Goals and CMS Choice Patient states their goals for this hospitalization and ongoing recovery are:: To return home CMS Medicare.gov Compare Post Acute Care list provided to:: Patient Represenative (must comment) (daughter at bedside) Choice offered to / list presented to : Adult Children [place] ownership interest in [facility].provided to:: Adult Children    Discharge Placement                       Discharge Plan and Services Additional resources added to the After Visit Summary for     Discharge Planning Services: CM Consult Post Acute Care Choice: Resumption of Svcs/PTA Provider, Home Health, Durable Medical Equipment          DME Arranged: 3-N-1 DME Agency: [employer] Date DME Agency Contacted: 09/16/23 Time DME Agency Contacted: 1500 Representative spoke with at DME Agency: Mittie Bodo HH Arranged: PT, Speech Therapy, Social Work, Nurse's Aide HH Agency:  ([facility]) Date HH Agency Contacted: 09/16/23 Time HH Agency Contacted: 1501 Representative spoke with at [facility] Agency: active with [facility] - aide  added  Social Drivers of Health (SDOH) Interventions SDOH Screenings   Food  Insecurity: No Food Insecurity (09/15/2023)  Housing: Low Risk  (09/15/2023)  Transportation Needs: No Transportation Needs (09/15/2023)  Utilities: Not At Risk (09/15/2023)  Alcohol Screen: Low Risk  (03/30/2022)  Depression (PHQ2-9): Low Risk  (07/29/2023)  Financial Resource Strain: Low Risk  (03/30/2022)  Physical Activity: Inactive (03/30/2022)  Social Connections: Moderately Integrated (09/15/2023)  Stress: No Stress Concern Present (03/30/2022)  Tobacco Use: Low Risk  (09/13/2023)     Readmission Risk Interventions     No data to display

## 2023-09-16 NOTE — Progress Notes (Signed)
 TRH night cross cover note:   I was notified by RN that the patient has complaining of insomnia refractory to existing order for prn melatonin, with the patient requesting an additional sleep aid at this time.  I subsequently ordered a one-time dose of trazodone to try to further address her insomnia.     Newton Pigg, DO Hospitalist

## 2023-09-16 NOTE — Consult Note (Signed)
 [facility] Psychiatric Consult Initial  Patient Name: .Janet Le  MRN: [id number]  DOB: 09-06-1955  Consult Order details:  Orders (From admission, onward)     Start     Ordered   09/15/23 1540  IP CONSULT TO PSYCHIATRY       Ordering Provider: Tobey Grim, MD  Provider:  (Not yet assigned)  Question Answer Comment  Location [facility]   Reason for Consult? 68 yo F with known Lewy Body dementia, admitted for worsening confusion/hallucinations.   Hopeful for recommendations to help with hallucinations      09/15/23 1540             Mode of Visit: In person    Psychiatry Consult Evaluation  Service Date: September 16, 2023 LOS:  LOS: 3 days  Chief Complaint Visual Hallucinations secondary two Lewy Body Dementia  Primary Psychiatric Diagnoses  Lewy Body Dementia   Assessment  Janet Le is a 68 y.o. female admitted: Medicallyfor 09/13/2023  6:09 PM for Altered Mental Status. She carries the psychiatric diagnoses of Major Neurocognitive Disorder and has a past medical history of  essential hypertension, type 2 diabetes, hyperlipidemia, generalized anxiety disorder, GERD, insomnia .   Her current presentation of Hallucinations is most consistent with Lewy Body Dementia. Current outpatient psychotropic medications include Rivastigmine 6 mg PO BID and historically she has had an unknown response to these medications. On initial examination, patient is calm, cooperative and pleasant. She is oriented to person, place, time, and situation. She has some issues recalling her visual hallucinations but reports that she has had these issues for years with the last experience being four days ago. Please see plan below for detailed recommendations.   Diagnoses:  Active Hospital problems: Principal Problem:   AMS (altered mental status)    Plan   ## Psychiatric Medication Recommendations:  -Agree with Rivastigmine 6 mg PO BID with meals -Consider  starting Seroquel 50 mg PO BID if the patient reports issues with visual hallucinations in the hospital. -Follow up outpatient appointment scheduled by patient's daughter 09/18/2023  ## Medical Decision Making Capacity: Not specifically addressed in this encounter  ## Further Work-up:  -- Per Primary Team -- Pertinent labwork reviewed earlier this admission includes:  Recent Results (from the past 2160 hours)  Microalbumin / creatinine urine ratio     Status: Abnormal   Collection Time: 07/29/23  3:03 PM  Result Value Ref Range   Microalb, Ur 3.6 (H) 0.0 - 1.9 mg/dL   Creatinine,U 295.2 mg/dL   Microalb Creat Ratio 1.0 0.0 - 30.0 mg/g  Lipid panel     Status: None   Collection Time: 07/29/23  3:03 PM  Result Value Ref Range   Cholesterol 150 0 - 200 mg/dL    Comment: ATP III Classification       Desirable:  < 200 mg/dL               Borderline High:  200 - 239 mg/dL          High:  > = 841 mg/dL   Triglycerides 324.4 0.0 - 149.0 mg/dL    Comment: Normal:  <010 mg/dLBorderline High:  150 - 199 mg/dL   HDL 27.25 >36.64 mg/dL   VLDL 40.3 0.0 - 47.4 mg/dL   LDL Cholesterol 79 0 - 99 mg/dL   Total CHOL/HDL Ratio 3     Comment:  Men          Women1/2 Average Risk     3.4          3.3Average Risk          5.0          4.42X Average Risk          9.6          7.13X Average Risk          15.0          11.0                       NonHDL 100.56     Comment: NOTE:  Non-HDL goal should be 30 mg/dL higher than patient's LDL goal (i.e. LDL goal of < 70 mg/dL, would have non-HDL goal of < 100 mg/dL)  Hemoglobin W0J     Status: None   Collection Time: 07/29/23  3:03 PM  Result Value Ref Range   Hgb A1c MFr Bld 5.4 4.6 - 6.5 %    Comment: Glycemic Control Guidelines for People with Diabetes:Non Diabetic:  <6%Goal of Therapy: <7%Additional Action Suggested:  >8%   Comprehensive metabolic panel     Status: None   Collection Time: 07/29/23  3:03 PM  Result Value Ref Range   Sodium 141 135  - 145 mEq/L   Potassium 3.9 3.5 - 5.1 mEq/L   Chloride 107 96 - 112 mEq/L   CO2 29 19 - 32 mEq/L   Glucose, Bld 92 70 - 99 mg/dL   BUN 9 6 - 23 mg/dL   Creatinine, Ser 8.11 0.40 - 1.20 mg/dL   Total Bilirubin 0.4 0.2 - 1.2 mg/dL   Alkaline Phosphatase 91 39 - 117 U/L   AST 16 0 - 37 U/L   ALT 12 0 - 35 U/L   Total Protein 6.9 6.0 - 8.3 g/dL   Albumin 3.8 3.5 - 5.2 g/dL   GFR 91.47 >82.95 mL/min    Comment: Calculated using the CKD-EPI Creatinine Equation (2021)   Calcium 10.0 8.4 - 10.5 mg/dL  CBC with Differential/Platelet     Status: Abnormal   Collection Time: 07/29/23  3:03 PM  Result Value Ref Range   WBC 9.2 4.0 - 10.5 K/uL   RBC 4.17 3.87 - 5.11 Mil/uL   Hemoglobin 13.4 12.0 - 15.0 g/dL   HCT 62.1 30.8 - 65.7 %   MCV 98.4 78.0 - 100.0 fl   MCHC 32.6 30.0 - 36.0 g/dL   RDW 84.6 96.2 - 95.2 %   Platelets 135.0 (L) 150.0 - 400.0 K/uL   Neutrophils Relative % 73.6 43.0 - 77.0 %   Lymphocytes Relative 17.7 12.0 - 46.0 %   Monocytes Relative 7.1 3.0 - 12.0 %   Eosinophils Relative 1.2 0.0 - 5.0 %   Basophils Relative 0.4 0.0 - 3.0 %   Neutro Abs 6.7 1.4 - 7.7 K/uL   Lymphs Abs 1.6 0.7 - 4.0 K/uL   Monocytes Absolute 0.7 0.1 - 1.0 K/uL   Eosinophils Absolute 0.1 0.0 - 0.7 K/uL   Basophils Absolute 0.0 0.0 - 0.1 K/uL  CBC     Status: Abnormal   Collection Time: 09/13/23  6:18 PM  Result Value Ref Range   WBC 11.9 (H) 4.0 - 10.5 K/uL   RBC 4.41 3.87 - 5.11 MIL/uL   Hemoglobin 14.1 12.0 - 15.0 g/dL   HCT 84.1 32.4 - 40.1 %   MCV 97.1  80.0 - 100.0 fL   MCH 32.0 26.0 - 34.0 pg   MCHC 32.9 30.0 - 36.0 g/dL   RDW 16.1 09.6 - 04.5 %   Platelets 131 (L) 150 - 400 K/uL    Comment: REPEATED TO VERIFY   nRBC 0.0 0.0 - 0.2 %    Comment: Performed at [facility], [address], [place], [place] [address]  Comprehensive metabolic panel     Status: Abnormal   Collection Time: 09/13/23  6:18 PM  Result Value Ref Range   Sodium 140 135 - 145 mmol/L   Potassium 2.9 (L)  3.5 - 5.1 mmol/L   Chloride 104 98 - 111 mmol/L   CO2 30 22 - 32 mmol/L   Glucose, Bld 87 70 - 99 mg/dL    Comment: Glucose reference range applies only to samples taken after fasting for at least 8 hours.   BUN 13 8 - 23 mg/dL   Creatinine, Ser 1.91 (H) 0.44 - 1.00 mg/dL   Calcium 47.8 8.9 - 29.5 mg/dL   Total Protein 6.9 6.5 - 8.1 g/dL   Albumin 3.7 3.5 - 5.0 g/dL   AST 42 (H) 15 - 41 U/L   ALT 18 0 - 44 U/L   Alkaline Phosphatase 82 38 - 126 U/L   Total Bilirubin 0.7 0.0 - 1.2 mg/dL   GFR, Estimated 40 (L) >60 mL/min    Comment: (NOTE) Calculated using the CKD-EPI Creatinine Equation (2021)    Anion gap 6 5 - 15    Comment: Performed at [facility], [address] [place], [place] [address]  Urinalysis, Routine w reflex microscopic -Urine, Clean Catch     Status: Abnormal   Collection Time: 09/13/23  6:18 PM  Result Value Ref Range   Color, Urine ORANGE (A) YELLOW    Comment: BIOCHEMICALS MAY BE AFFECTED BY COLOR MICROSCOPIC EXAM PERFORMED ON UNCONCENTRATED URINE LESS THAN 10 mL OF URINE SUBMITTED    APPearance CLOUDY (A) CLEAR   Specific Gravity, Urine >1.030 (H) 1.005 - 1.030   pH 6.0 5.0 - 8.0   Glucose, UA 100 (A) NEGATIVE mg/dL   Hgb urine dipstick TRACE (A) NEGATIVE   Bilirubin Urine MODERATE (A) NEGATIVE   Ketones, ur NEGATIVE NEGATIVE mg/dL   Protein, ur 865 (A) NEGATIVE mg/dL   Nitrite POSITIVE (A) NEGATIVE   Leukocytes,Ua TRACE (A) NEGATIVE    Comment: Performed at [facility], [address], [place], [place] [address]  Resp panel by RT-PCR (RSV, Flu A&B, Covid) Anterior Nasal Swab     Status: None   Collection Time: 09/13/23  6:18 PM   Specimen: Anterior Nasal Swab  Result Value Ref Range   SARS Coronavirus 2 by RT PCR NEGATIVE NEGATIVE   Influenza A by PCR NEGATIVE NEGATIVE   Influenza B by PCR NEGATIVE NEGATIVE    Comment: (NOTE) The Xpert Xpress SARS-CoV-2/FLU/RSV plus assay is intended as an aid in the diagnosis of influenza from  Nasopharyngeal swab specimens and should not be used as a sole basis for treatment. Nasal washings and aspirates are unacceptable for Xpert Xpress SARS-CoV-2/FLU/RSV testing.  Fact Sheet for Patients: BloggerCourse.com  Fact Sheet for Healthcare Providers: SeriousBroker.it  This test is not yet approved or cleared by the [place] FDA and has been authorized for detection and/or diagnosis of SARS-CoV-2 by FDA under an Emergency Use Authorization (EUA). This EUA will remain in effect (meaning this test can be used) for the duration of the  COVID-19 declaration under Section 564(b)(1) of the Act, 21 U.S.C. section 360bbb-3(b)(1), unless the authorization is terminated or revoked.     Resp Syncytial Virus by PCR NEGATIVE NEGATIVE    Comment: (NOTE) Fact Sheet for Patients: BloggerCourse.com  Fact Sheet for Healthcare Providers: SeriousBroker.it  This test is not yet approved or cleared by the [place] FDA and has been authorized for detection and/or diagnosis of SARS-CoV-2 by FDA under an Emergency Use Authorization (EUA). This EUA will remain in effect (meaning this test can be used) for the duration of the COVID-19 declaration under Section 564(b)(1) of the Act, 21 U.S.C. section 360bbb-3(b)(1), unless the authorization is terminated or revoked.  Performed at [facility], [address], [place], [place] [address]   TSH     Status: None   Collection Time: 09/13/23  6:18 PM  Result Value Ref Range   TSH 1.819 0.350 - 4.500 uIU/mL    Comment: Performed by a 3rd Generation assay with a functional sensitivity of <=0.01 uIU/mL. Performed at [facility], [address] [place], [place] [address]   Urinalysis, Microscopic (reflex)     Status: Abnormal   Collection Time: 09/13/23  6:18 PM  Result Value Ref Range   RBC / HPF 0-5 0 - 5 RBC/hpf   WBC, UA 6-10  0 - 5 WBC/hpf   Bacteria, UA MANY (A) NONE SEEN   Squamous Epithelial / HPF 11-20 0 - 5 /HPF   Mucus PRESENT    Amorphous Crystal PRESENT    Ca Oxalate Crys, UA PRESENT     Comment: Performed at [facility], [address] [place], [place] [address]  CK     Status: Abnormal   Collection Time: 09/13/23  6:18 PM  Result Value Ref Range   Total CK 1,076 (H) 38 - 234 U/L    Comment: Performed at [facility], [address], [place], [place] [address]  Urine Culture     Status: Abnormal   Collection Time: 09/13/23  8:33 PM   Specimen: Urine, Clean Catch  Result Value Ref Range   Specimen Description URINE, CLEAN CATCH    Special Requests      NONE Performed at [facility], [address] [place], [place] [address]    Culture MULTIPLE SPECIES PRESENT, SUGGEST RECOLLECTION (A)    Report Status 09/15/2023 FINAL   CBC     Status: Abnormal   Collection Time: 09/14/23  5:26 AM  Result Value Ref Range   WBC 10.2 4.0 - 10.5 K/uL   RBC 4.02 3.87 - 5.11 MIL/uL   Hemoglobin 12.9 12.0 - 15.0 g/dL   HCT 65.7 84.6 - 96.2 %   MCV 100.7 (H) 80.0 - 100.0 fL   MCH 32.1 26.0 - 34.0 pg   MCHC 31.9 30.0 - 36.0 g/dL   RDW 95.2 84.1 - 32.4 %   Platelets 111 (L) 150 - 400 K/uL    Comment: REPEATED TO VERIFY   nRBC 0.0 0.0 - 0.2 %    Comment: Performed at [facility], [address] [place], [place] [address]  Comprehensive metabolic panel     Status: Abnormal   Collection Time: 09/14/23  5:26 AM  Result Value Ref Range   Sodium 139 135 - 145 mmol/L   Potassium 3.5 3.5 - 5.1 mmol/L   Chloride 107 98 - 111 mmol/L   CO2 23 22 - 32 mmol/L   Glucose, Bld 78  70 - 99 mg/dL    Comment: Glucose reference range applies only to samples taken after fasting for at least 8 hours.   BUN 12 8 - 23 mg/dL   Creatinine, Ser 8.65 (H) 0.44 - 1.00 mg/dL   Calcium 9.5 8.9 - 78.4 mg/dL   Total Protein 6.2 (L) 6.5 - 8.1 g/dL   Albumin 3.2 (L) 3.5 - 5.0 g/dL   AST 44 (H) 15 - 41 U/L    ALT 19 0 - 44 U/L   Alkaline Phosphatase 73 38 - 126 U/L   Total Bilirubin 0.8 0.0 - 1.2 mg/dL   GFR, Estimated 52 (L) >60 mL/min    Comment: (NOTE) Calculated using the CKD-EPI Creatinine Equation (2021)    Anion gap 9 5 - 15    Comment: Performed at [facility], [address], [place], [place] [address]  Magnesium     Status: None   Collection Time: 09/14/23  5:26 AM  Result Value Ref Range   Magnesium 1.8 1.7 - 2.4 mg/dL    Comment: Performed at [facility], [address] [place], [place] [address]  Phosphorus     Status: None   Collection Time: 09/14/23  5:26 AM  Result Value Ref Range   Phosphorus 3.3 2.5 - 4.6 mg/dL    Comment: Performed at [facility], [address], [place], [place] [address]  HIV Antibody (routine testing w rflx)     Status: None   Collection Time: 09/14/23  5:26 AM  Result Value Ref Range   HIV Screen 4th Generation wRfx Non Reactive Non Reactive    Comment: Performed at [facility], [address] [place], [place] [address]  CBC     Status: Abnormal   Collection Time: 09/15/23  7:27 AM  Result Value Ref Range   WBC 7.4 4.0 - 10.5 K/uL   RBC 3.67 (L) 3.87 - 5.11 MIL/uL   Hemoglobin 11.8 (L) 12.0 - 15.0 g/dL   HCT 53.6 (L) 64.4 - 03.4 %   MCV 96.5 80.0 - 100.0 fL   MCH 32.2 26.0 - 34.0 pg   MCHC 33.3 30.0 - 36.0 g/dL   RDW 74.2 59.5 - 63.8 %   Platelets 112 (L) 150 - 400 K/uL    Comment: REPEATED TO VERIFY   nRBC 0.0 0.0 - 0.2 %    Comment: Performed at [facility], [address], [place], [place] [address]  Basic metabolic panel     Status: None   Collection Time: 09/15/23  7:27 AM  Result Value Ref Range   Sodium 140 135 - 145 mmol/L   Potassium 3.6 3.5 - 5.1 mmol/L   Chloride 109 98 - 111 mmol/L   CO2 26 22 - 32 mmol/L   Glucose, Bld 88 70 - 99 mg/dL    Comment: Glucose reference range applies only to samples taken after fasting for at least 8 hours.   BUN 11 8 - 23 mg/dL   Creatinine, Ser 3.29 0.44  - 1.00 mg/dL   Calcium 9.2 8.9 - 51.8 mg/dL   GFR, Estimated >84 >16 mL/min    Comment: (NOTE) Calculated using the CKD-EPI Creatinine Equation (2021)    Anion gap 5 5 - 15    Comment: Performed at [facility], [address], [place], [place] [address]      ## Disposition:-- There are no psychiatric contraindications to discharge at this time  ## Behavioral /  Environmental: - No specific recommendations at this time.     ## Safety and Observation Level:  - Based on my clinical evaluation, I estimate the patient to be at low risk of self harm in the current setting. - At this time, we recommend  routine. This decision is based on my review of the chart including patient's history and current presentation, interview of the patient, mental status examination, and consideration of suicide risk including evaluating suicidal ideation, plan, intent, suicidal or self-harm behaviors, risk factors, and protective factors. This judgment is based on our ability to directly address suicide risk, implement suicide prevention strategies, and develop a safety plan while the patient is in the clinical setting. Please contact our team if there is a concern that risk level has changed.  CSSR Risk Category:C-SSRS RISK CATEGORY: No Risk  Suicide Risk Assessment: Patient has following modifiable risk factors for suicide: social isolation, which we are addressing by recommending outpatient behavioral health treatment. Patient has following non-modifiable or demographic risk factors for suicide: separation or divorce Patient has the following protective factors against suicide: Access to outpatient mental health care, Cultural, spiritual, or religious beliefs that discourage suicide, and Frustration tolerance  Thank you for this consult request. Recommendations have been communicated to the primary team.  We will continue to follow at this time.   Harlin Heys, DO       History of Present  Illness   Patient Report:  Patient seen laying in bed this morning on my approach. She reports that she is currently in the hospital due to a fall she sustained at home. She reports that after the fall she experienced some neck pain. She is aware that psychiatry was consulted due to concerns for paranoia and visual hallucinations. She admits that she has been experiencing visual hallucinations for years but her last time having an episode like this was a few days ago.   Psych ROS:  Depression: Yes Anxiety:  No Mania (lifetime and current): No Psychosis: (lifetime and current): Visual Hallucinations  Collateral information:  Contacted Yaneli Keithley at [phone] on 09/16/2023 She reports that the patient has been experiencing visual hallucinations periodically over the past week. She reports that the patient has an outpatient appointment 09/18/2023. She reports that the patient has been doing much better since being hospitalized.  ROS   Psychiatric and Social History  Psychiatric History:  Information collected from Patient  Prev Dx/Sx: Depression and Lewy Body Dementia Current Psych Provider: None reported Home Meds (current): Rivastigmine 6 mg PO BID Previous Med Trials: Rivatigmine and Celexa Therapy: None reported  Prior Psych Hospitalization: Denies  Prior Self Harm: Denies Prior Violence: Denies  Family Psych History: Unknown Family Hx suicide: Unknown  Social History:  Developmental Hx: Denies Educational Hx: HS Graduate Occupational Hx: Retired Armed forces operational officer Hx: Denies Living Situation: Home alone Spiritual Hx: Christian Access to weapons/lethal means: Denies   Substance History Alcohol: Denies Type of alcohol NA Last Drink NA Number of drinks per day NA History of alcohol withdrawal seizures NA History of DT's NA Tobacco: Denies Illicit drugs: Denies Prescription drug abuse: Denies Rehab hx: NA  Exam Findings  Physical Exam:  Vital Signs:  Temp:  [97.8 F  (36.6 C)-99.2 F (37.3 C)] 98.9 F (37.2 C) (03/24 0813) Pulse Rate:  [74-83] 83 (03/24 0813) Resp:  [17-18] 18 (03/23 1610) BP: (116-143)/(51-72) 143/65 (03/24 0813) SpO2:  [97 %-99 %] 98 % (03/24 0813) Blood pressure (!) 143/65, pulse 83, temperature 98.9 F (37.2 C),  resp. rate 18, height 5\' 2"  (1.575 m), weight 92.5 kg, SpO2 98%. Body mass index is 37.3 kg/m.  Physical Exam  Mental Status Exam: General Appearance: Disheveled  Orientation:  Full (Time, Place, and Person)  Memory:  Fair  Concentration:  Fair  Recall:  Fair  Attention  Fair  Eye Contact:  Good  Speech:  Clear and Coherent  Language:  Good  Volume:  Normal  Mood: Fine  Affect:  Constricted  Thought Process:  Goal Directed  Thought Content:  Logical  Suicidal Thoughts:  No  Homicidal Thoughts:  No  Judgement:  Fair  Insight:  Poor  Psychomotor Activity:  Normal  Akathisia:  NA  Fund of Knowledge:  Fair      Assets:  Manufacturing systems engineer Housing  Cognition:  WNL  ADL's:  Intact  AIMS (if indicated):        Other History   These have been pulled in through the EMR, reviewed, and updated if appropriate.  Family History:  The patient's family history includes Alcohol abuse in her brother, daughter, and father; Alcoholism in her father; Asthma in an other family member; Breast cancer in her cousin and maternal aunt; Cancer in an other family member; Colitis in her brother; Colon polyps in an other family member; Diabetes in her mother; Heart disease in her mother; Hyperlipidemia in her mother; Hypertension in her mother; Kidney disease in her father and mother; Leukemia in her brother; Memory loss in her mother; Multiple sclerosis in her sister; Obesity in her mother; Prostate cancer in her father; Sleep apnea in her mother; Stroke in her mother; Thyroid disease in her mother.  Medical History: Past Medical History:  Diagnosis Date   Allergic rhinitis 08/31/2009   Arthralgia of hip 11/14/2017    Attention deficit hyperactivity disorder 06/06/2020   diagnosed as adult w/o formal testing   Bilateral leg edema 06/12/2018   Blepharitis 11/07/2019   Blood dyscrasia    platelets low in past   Breast asymmetry 04/08/2021   Cataract    Cervical radiculopathy 09/07/2020   Cervicalgia 05/30/2016   Chronic low back pain 07/18/2016   Degeneration of lumbar intervertebral disc 01/09/2018   Dyspnea on exertion 05/11/2019   Endometriosis    Esophageal stricture    Essential hypertension 05/06/2009   Excessive daytime sleepiness 05/11/2019   External hemorrhoids 08/28/2010   Fatigue 11/14/2017   Generalized anxiety disorder 10/08/2017   GERD (gastroesophageal reflux disease)    Glaucoma 06/06/2020   Gout 12/11/2013   Hiatal hernia    History of positive PPD 04/09/2022   History of latent TB-completed treatment     Hyperlipidemia 03/16/2010   Hypertensive retinopathy of both eyes 10/06/2019   IBS (irritable bowel syndrome) with chronic constipation 11/14/2010   Insomnia 12/21/2022   Iridocyclitis 10/06/2019   Laryngopharyngeal reflux (LPR) 07/03/2018   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  No significant change from 2017  dexa 10/2015: t score  Spine -1.3, dual femur -0.9  Took alendronate 2014-2019   Pain in left knee  09/08/2014   Post traumatic stress disorder (PTSD)    Primary osteoarthritis involving multiple joints 03/10/2019   Bilateral hips, knees   Pseudophakia of both eyes 10/06/2019   Pure hypercholesterolemia 03/16/2010   REM sleep  behaviors    Renal cell carcinoma    R cryoablation by IR 02/16/11, Dr. Fredia Sorrow  Followed by Dr. Claire Shown  No evidence of residual disease on CT 12/13 and 12/14   Rib pain on left side 01/03/2021   S/P knee surgery 10/30/2016   Snoring 04/09/2022   Spondylolisthesis of lumbar region 01/09/2018   Syncope and collapse 05/16/2009   since childhood; associated with extreme heat   TB lung, latent 11/25/2019   Thrombocytopenia 05/30/2015   Saw hem 02/2017 - mild, chronic - advised to stop protonix, no concerning cause, plan for pcp to monitor   Tinnitus of right ear 07/03/2018   Type 2 diabetes mellitus without complication, without long-term current use of insulin 10/06/2019   Urinary incontinence 07/03/2021   Venous insufficiency of both lower extremities 04/06/2021   Visual hallucinations    Vulvar itching 01/03/2021    Surgical History: Past Surgical History:  Procedure Laterality Date   BREAST CYST EXCISION Bilateral over 10 years ago   No visable scar    CATARACT EXTRACTION, BILATERAL Bilateral 2020   COLONOSCOPY     fallopian tube removed     FUNCTIONAL ENDOSCOPIC SINUS SURGERY     HEMORRHOID BANDING     LASER ABLATION OF THE CERVIX     NASAL SINUS SURGERY     RENAL CRYOABLATION  02/16/11   R kidney due to RCC (IR procedure)   TOTAL KNEE ARTHROPLASTY Right 10/30/2016   Procedure: RIGHT TOTAL KNEE ARTHROPLASTY;  Surgeon: Cammy Copa, MD;  Location: [facility] OR;  Service: Orthopedics;  Laterality: Right;   TUBAL LIGATION     UMBILICAL HERNIA REPAIR       Medications:   Current Facility-Administered Medications:    acetaminophen (TYLENOL) tablet 650 mg, 650 mg, Oral, Q6H PRN, Hall, Carole N, DO   atorvastatin (LIPITOR) tablet 10 mg, 10 mg, Oral, Daily, Hall, Carole N, DO, 10 mg at 09/15/23 0956   cefTRIAXone (ROCEPHIN) 1 g in sodium chloride 0.9 % 100 mL IVPB, 1 g, Intravenous, Q24H, Hall, Carole N, DO, Last Rate: 200 mL/hr at 09/15/23 2131, 1 g at 09/15/23 2131    enoxaparin (LOVENOX) injection 40 mg, 40 mg, Subcutaneous, Q24H, Hall, Carole N, DO, 40 mg at 09/15/23 0956   fluticasone (FLONASE) 50 MCG/ACT nasal spray 2 spray, 2 spray, Each Nare, Daily, Dow Adolph N, DO, 2 spray at 09/15/23 1417   folic acid (FOLVITE) tablet 1 mg, 1 mg, Oral, Daily, Hall, Carole N, DO, 1 mg at 09/15/23 6578   influenza vaccine adjuvanted (FLUAD) injection 0.5 mL, 0.5 mL, Intramuscular, Tomorrow-1000, Tobey Grim, MD   melatonin tablet 5 mg, 5 mg, Oral, QHS, Tobey Grim, MD, 5 mg at 09/15/23 2130   polyethylene glycol (MIRALAX / GLYCOLAX) packet 17 g, 17 g, Oral, Daily, Tobey Grim, MD   prochlorperazine (COMPAZINE) injection 5 mg, 5 mg, Intravenous, Q6H PRN, Margo Aye, Carole N, DO   rivastigmine (EXELON) capsule 6 mg, 6 mg, Oral, BID WC, Hall, Carole N, DO, 6 mg at 09/15/23 1616   simethicone (MYLICON) chewable tablet 80 mg, 80 mg, Oral, Q6H PRN, Tobey Grim, MD, 80 mg at 09/15/23 1616  Allergies: Allergies  Allergen Reactions   Sertraline Hcl Other (See Comments)    Spasms;  numbness   Ace Inhibitors Cough   Codeine Palpitations   Darifenacin Hydrobromide Er Other (See Comments)    Pt states made her feel queezy & drunk   Gabapentin Other (See Comments)    Hallucinations   Pravastatin Other (See Comments)    myalgias   Propoxyphene Hcl Palpitations   Seroquel [Quetiapine] Other (See Comments)    Woke up screaming and yelling after taking it   Wellbutrin [Bupropion Hcl] Other (See Comments)    Numbness of mouth/lips    Harlin Heys, DO

## 2023-09-16 NOTE — Telephone Encounter (Signed)
 PA approved.

## 2023-09-16 NOTE — Discharge Summary (Addendum)
 Physician Discharge Summary   Patient: Janet Le MRN: [id number] DOB: May 23, 1956  Admit date:     09/13/2023  Discharge date: 09/16/23  Discharge Physician: Tobey Grim   PCP: Pincus Sanes, MD   Recommendations at discharge:    Patient instructed to keep appt with psychiatrist, scheduled for this coming Wednesday.  Discharged home with 4 more days of Duricef as antibiotic for UTI. Issues to discuss with outpt psychiatry: Need to continue Citalopram?  Sounds like patient's hallucinations started around the time that she was put on this by her neurologist.  Stop this admission. Patient is homeless and therefore diabetes.  Although, there are some reports of this causing suicidal ideation or other cognitive issues.  Question whether she should continue this.   Patient received 1 dose of trazodone evening on 3/23 which help with sleep.  Inpatient psychiatry recommended Seroquel to possibly help with sleep and hallucinations with Lewy body dementia.   Discharge Diagnoses: Principal Problem:   AMS (altered mental status)  Resolved Problems:   * No resolved hospital problems. *  Brief Hospital Course: 68 y.o. female with medical history significant for Lewy body dementia with mood disturbance, essential hypertension, type 2 diabetes, hyperlipidemia, generalized anxiety disorder, GERD, insomnia, who presents to the ER from home due to altered mental status, confusion, visual hallucinations, worse than her baseline.  Associated with generalized weakness that started night prior to admission.  Brought to ED, concern for UTI causing symptoms. Also with some somnolence day prior to admission that resolved HD#1.   Daughter able to provide better history after admission.  Hallucinations started roughly 1 week after patient was started on citalopram.  Persisted for about a month but then acutely worsened this past week.  Day prior to admission with increasing sleepiness.  Foul-smelling  urine.  In ER there was concern for urinary tract infection and she was started on Rocephin.  This was continued while in house.  Cultures grew multiple species was not predominant but because she had cleared so well and so quickly with no other presumptive treatment we opted to continue to antibiotics on discharge.  Also stop citalopram in house but she has started to clear before stopping this medicine.    No further hallucinations after admission.  Did have ongoing paranoia and some confusion.     Assessment & Plan: Acute metabolic encephalopathy suspect secondary to presumed UTI in the setting of Lewy body dementia with behavioral disturbance -Appreciate psychiatry input.  She has psychiatry outpatient scheduled on Wednesday of this week. -We did stop her Celexa and plan to hold this outpatient.  She can pending psychiatric input this coming Wednesday. -Also note potential concerns with Ozempic as above.  Presumptive UTI -Could be contributing to problem #1 above.  Urine culture grew no specific organism. -No other specific cause for acute worsening of hallucinations. -Discharged home with Duricef twice daily for the next 4 days.   AKI, suspect multifactorial Resolved at discharge.   Hypokalemia Resolved, will follow   Generalized weakness Early mobilization PT OT assessment Fall precautions -Patient is moving back towards baseline for her daughter. -Social work consulted and 3: 1 order to discharge as well as home health and possible day classes with her current adult care services.   Isolated elevated AST, unclear etiology AST 42, ALT 18 Ongoing  Recommend outpatient follow-up for resolution.  Remained stable while in house.   Hyperlipidemia Resume home Lipitor   Chronic anxiety Resume home Celexa.   Constipation: -Resolved  prior to discharge.  CDI Query response: - Class 1 Obesity as per CDI query note      Consultants: Psychiatry isposition: Home Diet  recommendation:  Discharge Diet Orders (From admission, onward)     Start     Ordered   09/16/23 0000  Diet - low sodium heart healthy        09/16/23 1539           Regular diet DISCHARGE MEDICATION: Allergies as of 09/16/2023       Reactions   Sertraline Hcl Other (See Comments)   Spasms; numbness   Ace Inhibitors Cough   Codeine Palpitations   Darifenacin Hydrobromide Er Other (See Comments)   Pt states made her feel queezy & drunk   Gabapentin Other (See Comments)   Hallucinations   Pravastatin Other (See Comments)   myalgias   Propoxyphene Hcl Palpitations   Seroquel [quetiapine] Other (See Comments)   Woke up screaming and yelling after taking it   Wellbutrin [bupropion Hcl] Other (See Comments)   Numbness of mouth/lips        Medication List     STOP taking these medications    citalopram 10 MG tablet Commonly known as: CeleXA       TAKE these medications    acetaminophen 500 MG tablet Commonly known as: TYLENOL Take 1,000 mg by mouth every 6 (six) hours as needed for mild pain (pain score 1-3) or headache.   atorvastatin 10 MG tablet Commonly known as: LIPITOR TAKE ONE TABLET BY MOUTH DAILY AT 5PM   Belsomra 15 MG Tabs Generic drug: Suvorexant Take 1 tablet (15 mg total) by mouth at bedtime as needed.   cefadroxil 500 MG capsule Commonly known as: DURICEF Take 1 capsule (500 mg total) by mouth 2 (two) times daily for 4 days.   dicyclomine 10 MG capsule Commonly known as: BENTYL Take 10-20 mg by mouth 2 (two) times daily as needed.   DULoxetine 60 MG capsule Commonly known as: CYMBALTA Take 60 mg by mouth daily.   fluticasone 50 MCG/ACT nasal spray Commonly known as: FLONASE Place 2 sprays into both nostrils daily.   folic acid 1 MG tablet Commonly known as: FOLVITE TAKE ONE TABLET BY MOUTH DAILY AT 9AM   furosemide 20 MG tablet Commonly known as: LASIX TAKE ONE TABLET BY MOUTH DAILY AT 9AM   linaclotide 72 MCG  capsule Commonly known as: Linzess Take 1 capsule (72 mcg total) by mouth daily before breakfast. For constipation What changed:  when to take this reasons to take this   Ozempic (1 MG/DOSE) 4 MG/3ML Sopn Generic drug: Semaglutide (1 MG/DOSE) INJECT 1 MG INTO THE SKIN ONCE A WEEK   propranolol 80 MG tablet Commonly known as: INDERAL Take 80 mg by mouth 2 (two) times daily.   rivastigmine 6 MG capsule Commonly known as: EXELON Take 1 capsule (6 mg total) by mouth 2 (two) times daily.   telmisartan 80 MG tablet Commonly known as: MICARDIS TAKE ONE TABLET BY MOUTH DAILY AT 9AM (ORIG)               Durable Medical Equipment  (From admission, onward)           Start     Ordered   09/16/23 1437  For home use only DME Bedside commode  Once       Comments: Patient needs 3:1.  Question:  Patient needs a bedside commode to treat with the following condition  Answer:  Generalized  weakness   09/16/23 1437            Follow-up Information     [facility] Follow up.   Specialty: Home Health Services Why: [facility] will continue to provide home health services. Contact information: [address] [place] [address] [phone]         [facility] Follow up.   Why: [employer] will deliver a 3:1 to the bedside before you are discharged home today. Contact information: [address] [place] [address] [phone]                Discharge Exam: Filed Weights   09/13/23 1814  Weight: 92.5 kg   Gen: 68 y.o. female in no apparent distress.  Nontoxic Pulm: Non-labored breathing.  Clear to auscultation bilaterally.  CV: Regular rate and rhythm. No murmur, rub, or gallop. No JVD GI: Abdomen soft, non-tender, non-distended, with normoactive bowel sounds. No organomegaly or masses felt. Ext: Warm, no deformities, no pedal edema Skin: No rashes, lesions no ulcers Neuro: Alert and oriented. No focal neurological  deficits. Psych: Calm but endorses being frightened.  No further visual hallucinations.  No SI/HI  Condition at discharge: good  The results of significant diagnostics from this hospitalization (including imaging, microbiology, ancillary and laboratory) are listed below for reference.   Imaging Studies: CT Cervical Spine Wo Contrast Result Date: 09/13/2023 CLINICAL DATA:  Head and neck trauma. EXAM: CT HEAD WITHOUT CONTRAST CT CERVICAL SPINE WITHOUT CONTRAST TECHNIQUE: Multidetector CT imaging of the head and cervical spine was performed following the standard protocol without intravenous contrast. Multiplanar CT image reconstructions of the cervical spine were also generated. RADIATION DOSE REDUCTION: This exam was performed according to the departmental dose-optimization program which includes automated exposure control, adjustment of the mA and/or kV according to patient size and/or use of iterative reconstruction technique. COMPARISON:  None Available. FINDINGS: CT HEAD FINDINGS Brain: No acute intracranial hemorrhage, midline shift or mass effect is seen. No extra-axial fluid collection. Mild periventricular white matter hypodensities are present bilaterally. No hydrocephalus. Vascular: No hyperdense vessel or unexpected calcification. Skull: Normal. Negative for fracture or focal lesion. Sinuses/Orbits: Mucosal thickening is noted in the right maxillary sinus. No acute orbital abnormality. Other: None. CT CERVICAL SPINE FINDINGS Alignment: Normal. Skull base and vertebrae: No acute fracture. No primary bone lesion or focal pathologic process. Soft tissues and spinal canal: No prevertebral fluid or swelling. No visible canal hematoma. Disc levels: Multilevel intervertebral disc space narrowing, degenerative endplate changes, and facet arthropathy, most pronounced from C4-C6. Upper chest: No acute abnormality. Other: None. IMPRESSION: 1. No acute intracranial process. 2. Multilevel degenerative changes  in the cervical spine without evidence of acute fracture. Electronically Signed   By: Thornell Sartorius M.D.   On: 09/13/2023 20:20   CT Head Wo Contrast Result Date: 09/13/2023 CLINICAL DATA:  Head and neck trauma. EXAM: CT HEAD WITHOUT CONTRAST CT CERVICAL SPINE WITHOUT CONTRAST TECHNIQUE: Multidetector CT imaging of the head and cervical spine was performed following the standard protocol without intravenous contrast. Multiplanar CT image reconstructions of the cervical spine were also generated. RADIATION DOSE REDUCTION: This exam was performed according to the departmental dose-optimization program which includes automated exposure control, adjustment of the mA and/or kV according to patient size and/or use of iterative reconstruction technique. COMPARISON:  None Available. FINDINGS: CT HEAD FINDINGS Brain: No acute intracranial hemorrhage, midline shift or mass effect is seen. No extra-axial fluid collection. Mild periventricular white matter hypodensities  are present bilaterally. No hydrocephalus. Vascular: No hyperdense vessel or unexpected calcification. Skull: Normal. Negative for fracture or focal lesion. Sinuses/Orbits: Mucosal thickening is noted in the right maxillary sinus. No acute orbital abnormality. Other: None. CT CERVICAL SPINE FINDINGS Alignment: Normal. Skull base and vertebrae: No acute fracture. No primary bone lesion or focal pathologic process. Soft tissues and spinal canal: No prevertebral fluid or swelling. No visible canal hematoma. Disc levels: Multilevel intervertebral disc space narrowing, degenerative endplate changes, and facet arthropathy, most pronounced from C4-C6. Upper chest: No acute abnormality. Other: None. IMPRESSION: 1. No acute intracranial process. 2. Multilevel degenerative changes in the cervical spine without evidence of acute fracture. Electronically Signed   By: Thornell Sartorius M.D.   On: 09/13/2023 20:20   DG Chest 2 View Result Date: 09/13/2023 CLINICAL DATA:   Fall, weakness, cough. EXAM: CHEST - 2 VIEW COMPARISON:  01/03/2021 FINDINGS: The cardiomediastinal contours are normal. Upper normal heart size. The lungs are clear. Pulmonary vasculature is normal. No consolidation, pleural effusion, or pneumothorax. No acute osseous abnormalities are seen. IMPRESSION: No acute findings. Electronically Signed   By: Narda Rutherford M.D.   On: 09/13/2023 19:42    Microbiology: Results for orders placed or performed during the hospital encounter of 09/13/23  Resp panel by RT-PCR (RSV, Flu A&B, Covid) Anterior Nasal Swab     Status: None   Collection Time: 09/13/23  6:18 PM   Specimen: Anterior Nasal Swab  Result Value Ref Range Status   SARS Coronavirus 2 by RT PCR NEGATIVE NEGATIVE Final   Influenza A by PCR NEGATIVE NEGATIVE Final   Influenza B by PCR NEGATIVE NEGATIVE Final    Comment: (NOTE) The Xpert Xpress SARS-CoV-2/FLU/RSV plus assay is intended as an aid in the diagnosis of influenza from Nasopharyngeal swab specimens and should not be used as a sole basis for treatment. Nasal washings and aspirates are unacceptable for Xpert Xpress SARS-CoV-2/FLU/RSV testing.  Fact Sheet for Patients: BloggerCourse.com  Fact Sheet for Healthcare Providers: SeriousBroker.it  This test is not yet approved or cleared by the [place] FDA and has been authorized for detection and/or diagnosis of SARS-CoV-2 by FDA under an Emergency Use Authorization (EUA). This EUA will remain in effect (meaning this test can be used) for the duration of the COVID-19 declaration under Section 564(b)(1) of the Act, 21 U.S.C. section 360bbb-3(b)(1), unless the authorization is terminated or revoked.     Resp Syncytial Virus by PCR NEGATIVE NEGATIVE Final    Comment: (NOTE) Fact Sheet for Patients: BloggerCourse.com  Fact Sheet for Healthcare  Providers: SeriousBroker.it  This test is not yet approved or cleared by the [place] FDA and has been authorized for detection and/or diagnosis of SARS-CoV-2 by FDA under an Emergency Use Authorization (EUA). This EUA will remain in effect (meaning this test can be used) for the duration of the COVID-19 declaration under Section 564(b)(1) of the Act, 21 U.S.C. section 360bbb-3(b)(1), unless the authorization is terminated or revoked.  Performed at [facility], [address], [place], [place] [address]   Urine Culture     Status: Abnormal   Collection Time: 09/13/23  8:33 PM   Specimen: Urine, Clean Catch  Result Value Ref Range Status   Specimen Description URINE, CLEAN CATCH  Final   Special Requests   Final    NONE Performed at [facility] [place] [facility], [address], [place], [place] [address]    Culture MULTIPLE SPECIES PRESENT, SUGGEST RECOLLECTION (A)  Final  Report Status 09/15/2023 FINAL  Final   *Note: Due to a large number of results and/or encounters for the requested time period, some results have not been displayed. A complete set of results can be found in Results Review.    Labs: CBC: Recent Labs  Lab 09/13/23 1818 09/14/23 0526 09/15/23 0727 09/16/23 0904  WBC 11.9* 10.2 7.4 5.3  HGB 14.1 12.9 11.8* 11.0*  HCT 42.8 40.5 35.4* 33.7*  MCV 97.1 100.7* 96.5 97.1  PLT 131* 111* 112* 107*   Basic Metabolic Panel: Recent Labs  Lab 09/13/23 1818 09/14/23 0526 09/15/23 0727  NA 140 139 140  K 2.9* 3.5 3.6  CL 104 107 109  CO2 30 23 26   GLUCOSE 87 78 88  BUN 13 12 11   CREATININE 1.44* 1.15* 0.91  CALCIUM 10.0 9.5 9.2  MG  --  1.8  --   PHOS  --  3.3  --    Liver Function Tests: Recent Labs  Lab 09/13/23 1818 09/14/23 0526  AST 42* 44*  ALT 18 19  ALKPHOS 82 73  BILITOT 0.7 0.8  PROT 6.9 6.2*  ALBUMIN 3.7 3.2*   CBG: No results for input(s): "GLUCAP" in the last 168 hours.  Discharge time spent: less  than 30 minutes.  Signed: Tobey Grim, MD [facility] 09/16/2023

## 2023-09-16 NOTE — Plan of Care (Signed)

## 2023-09-17 NOTE — Care Management Important Message (Signed)
 Important Message  Patient Details  Name: Janet Le MRN: [id number] Date of Birth: [birth year]   Important Message Given:  Yes - Medicare IM   Patient left Prior to IM delivery will mail a copy to the patient home address  Dorena Bodo 09/17/2023, 3:34 PM

## 2023-09-18 ENCOUNTER — Encounter (HOSPITAL_COMMUNITY): Payer: Self-pay | Admitting: Family

## 2023-09-18 ENCOUNTER — Other Ambulatory Visit: Payer: Self-pay

## 2023-09-18 ENCOUNTER — Ambulatory Visit (HOSPITAL_BASED_OUTPATIENT_CLINIC_OR_DEPARTMENT_OTHER): Admitting: Family

## 2023-09-18 VITALS — BP 119/83 | Temp 77.0°F | Ht 62.0 in | Wt 201.0 lb

## 2023-09-18 DIAGNOSIS — F4323 Adjustment disorder with mixed anxiety and depressed mood: Secondary | ICD-10-CM

## 2023-09-18 NOTE — Addendum Note (Signed)
 Addended by: [name] on: 09/18/2023 11:17 AM   Modules accepted: Level of Service
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
## 2023-09-19 ENCOUNTER — Encounter: Payer: Self-pay | Admitting: Internal Medicine

## 2023-09-19 DIAGNOSIS — F411 Generalized anxiety disorder: Secondary | ICD-10-CM | POA: Diagnosis not present

## 2023-09-19 DIAGNOSIS — G3183 Dementia with Lewy bodies: Secondary | ICD-10-CM | POA: Diagnosis not present

## 2023-09-19 DIAGNOSIS — F02A4 Dementia in other diseases classified elsewhere, mild, with anxiety: Secondary | ICD-10-CM | POA: Diagnosis not present

## 2023-09-19 DIAGNOSIS — F02A18 Dementia in other diseases classified elsewhere, mild, with other behavioral disturbance: Secondary | ICD-10-CM | POA: Diagnosis not present

## 2023-09-19 DIAGNOSIS — E1140 Type 2 diabetes mellitus with diabetic neuropathy, unspecified: Secondary | ICD-10-CM | POA: Diagnosis not present

## 2023-09-19 DIAGNOSIS — F329 Major depressive disorder, single episode, unspecified: Secondary | ICD-10-CM | POA: Diagnosis not present

## 2023-09-19 DIAGNOSIS — F02A3 Dementia in other diseases classified elsewhere, mild, with mood disturbance: Secondary | ICD-10-CM | POA: Diagnosis not present

## 2023-09-19 DIAGNOSIS — N390 Urinary tract infection, site not specified: Secondary | ICD-10-CM | POA: Insufficient documentation

## 2023-09-19 DIAGNOSIS — G4700 Insomnia, unspecified: Secondary | ICD-10-CM | POA: Diagnosis not present

## 2023-09-19 NOTE — Progress Notes (Unsigned)
 Subjective:    Patient ID: Janet Le, female    DOB: [birth year], 68 y.o.   MRN: [id number]     HPI Janet Le is here for follow up from the hospital.   Admitted 3/22 - 3/24 for AMS  Brought by family for AMS, confusion, visual hallucinations - worse than baseline.  She also had generalized weakness and somnolence x 1 day.  ? UTI as cause  Hallucinations started about 1 week after started on citalopram.  They lasted one month but acutely worsened the week prior to admission. Day prior to admission she was sleepy.  Urine foul smelling.  Started on rocephin in ED.  She was continued on rochephin.   Urine culture with multiple species.  Symptoms improved and was continued on abx.    No further hallucinations after admission.  Did have ongoing paranoia and confusion.     Acute metabolic encephalopathy suspect secondary to presumed UTI in setting of lewy body dementia w behavioral disturbance: Psychiatry consulted - saw psych outpatient already Celexa stopped ? Concerns with ozempic  Presumptive UTI: Urine culture with multiple species ? Cause of AMS Treated with rocephin in hospital - discharged on duricef x 4 days  AKI:  Multifactorial Resolved  Hypokalemia Resolved  Generalized weakness Early mobilization PT, OT Fall precautions Improved Home health, consider day classes w current adult care services  Isolated elevated AST ? Cause Ongoing F/u as outpatient  Hyperlipidemia Resume lipitor  Chronic Anxiety Resumed home celexa  Constipation Resolved prior to d/c     Medications and allergies reviewed with patient and updated if appropriate.  Current Outpatient Medications on File Prior to Visit  Medication Sig Dispense Refill   acetaminophen (TYLENOL) 500 MG tablet Take 1,000 mg by mouth every 6 (six) hours as needed for mild pain (pain score 1-3) or headache.     atorvastatin (LIPITOR) 10 MG tablet TAKE ONE TABLET BY MOUTH DAILY AT 5PM 90 tablet  11   cefadroxil (DURICEF) 500 MG capsule Take 1 capsule (500 mg total) by mouth 2 (two) times daily for 4 days. 8 capsule 0   dicyclomine (BENTYL) 10 MG capsule Take 10-20 mg by mouth 2 (two) times daily as needed.     DULoxetine (CYMBALTA) 60 MG capsule Take 60 mg by mouth daily.     fluticasone (FLONASE) 50 MCG/ACT nasal spray Place 2 sprays into both nostrils daily. 16 g 11   folic acid (FOLVITE) 1 MG tablet TAKE ONE TABLET BY MOUTH DAILY AT 9AM 240 tablet 11   furosemide (LASIX) 20 MG tablet TAKE ONE TABLET BY MOUTH DAILY AT 9AM 60 tablet 11   linaclotide (LINZESS) 72 MCG capsule Take 1 capsule (72 mcg total) by mouth daily before breakfast. For constipation (Patient taking differently: Take 72 mcg by mouth daily as needed. For constipation) 90 capsule 1   OZEMPIC, 1 MG/DOSE, 4 MG/3ML SOPN INJECT 1 MG INTO THE SKIN ONCE A WEEK 3 mL 0   propranolol (INDERAL) 80 MG tablet Take 80 mg by mouth 2 (two) times daily.     rivastigmine (EXELON) 6 MG capsule Take 1 capsule (6 mg total) by mouth 2 (two) times daily. 60 capsule 5   Suvorexant (BELSOMRA) 15 MG TABS Take 1 tablet (15 mg total) by mouth at bedtime as needed. 30 tablet 0   telmisartan (MICARDIS) 80 MG tablet TAKE ONE TABLET BY MOUTH DAILY AT 9AM (ORIG) 60 tablet 11   No current facility-administered medications on file prior  to visit.     Review of Systems     Objective:  There were no vitals filed for this visit. BP Readings from Last 3 Encounters:  09/16/23 (!) 143/65  08/08/23 103/76  07/29/23 112/74   Wt Readings from Last 3 Encounters:  09/13/23 203 lb 14.8 oz (92.5 kg)  08/08/23 204 lb (92.5 kg)  07/29/23 208 lb (94.3 kg)   There is no height or weight on file to calculate BMI.    Physical Exam     Lab Results  Component Value Date   WBC 5.3 09/16/2023   HGB 11.0 (L) 09/16/2023   HCT 33.7 (L) 09/16/2023   PLT 107 (L) 09/16/2023   GLUCOSE 88 09/15/2023   CHOL 150 07/29/2023   TRIG 110.0 07/29/2023   HDL 49.60  07/29/2023   LDLDIRECT 143.8 07/01/2012   LDLCALC 79 07/29/2023   ALT 19 09/14/2023   AST 44 (H) 09/14/2023   NA 140 09/15/2023   K 3.6 09/15/2023   CL 109 09/15/2023   CREATININE 0.91 09/15/2023   BUN 11 09/15/2023   CO2 26 09/15/2023   TSH 1.819 09/13/2023   INR 0.91 02/08/2011   HGBA1C 5.4 07/29/2023   MICROALBUR 3.6 (H) 07/29/2023     Assessment & Plan:    See Problem List for Assessment and Plan of chronic medical problems.

## 2023-09-20 ENCOUNTER — Ambulatory Visit (INDEPENDENT_AMBULATORY_CARE_PROVIDER_SITE_OTHER): Admitting: Internal Medicine

## 2023-09-20 ENCOUNTER — Encounter: Payer: Self-pay | Admitting: Internal Medicine

## 2023-09-20 VITALS — BP 96/64 | HR 73 | Temp 97.6°F | Ht 62.0 in | Wt 198.4 lb

## 2023-09-20 DIAGNOSIS — E119 Type 2 diabetes mellitus without complications: Secondary | ICD-10-CM

## 2023-09-20 DIAGNOSIS — I10 Essential (primary) hypertension: Secondary | ICD-10-CM | POA: Diagnosis not present

## 2023-09-20 DIAGNOSIS — G4700 Insomnia, unspecified: Secondary | ICD-10-CM

## 2023-09-20 DIAGNOSIS — R4182 Altered mental status, unspecified: Secondary | ICD-10-CM | POA: Diagnosis not present

## 2023-09-20 DIAGNOSIS — R600 Localized edema: Secondary | ICD-10-CM | POA: Diagnosis not present

## 2023-09-20 DIAGNOSIS — N3000 Acute cystitis without hematuria: Secondary | ICD-10-CM | POA: Diagnosis not present

## 2023-09-20 LAB — URINALYSIS, ROUTINE W REFLEX MICROSCOPIC
Bilirubin Urine: NEGATIVE
Ketones, ur: NEGATIVE
Nitrite: NEGATIVE
Specific Gravity, Urine: 1.015 (ref 1.000–1.030)
Total Protein, Urine: NEGATIVE
Urine Glucose: NEGATIVE
Urobilinogen, UA: 0.2 (ref 0.0–1.0)
pH: 6.5 (ref 5.0–8.0)

## 2023-09-20 MED ORDER — PROPRANOLOL HCL 40 MG PO TABS
40.0000 mg | ORAL_TABLET | Freq: Two times a day (BID) | ORAL | 1 refills | Status: DC
Start: 2023-09-20 — End: 2023-12-24

## 2023-09-20 NOTE — Assessment & Plan Note (Addendum)
 Chronic Lab Results  Component Value Date   HGBA1C 5.4 07/29/2023   Sugars have been controlled  Continue Ozempic 1 mg weekly-advised to discuss with Dr. Donell Beers if this is a concern and if it is okay for her to continue Encouraged regular exercise

## 2023-09-20 NOTE — Assessment & Plan Note (Addendum)
 Chronic Blood pressure on low side over the past week Monitor at home Decrease propranolol to 40 mg twice daily Continue telmisartan 80 mg daily

## 2023-09-20 NOTE — Assessment & Plan Note (Signed)
 Chronic Stable-no edema on exam Continue Lasix 20 mg daily

## 2023-09-20 NOTE — Patient Instructions (Addendum)
      Urine tests were ordered.       Medications changes include :   decrease  propanolol to 40 mg twice daily.    Drink prune juice and take a stool softener daily    Return in about 6 months (around 03/22/2024) for follow up.

## 2023-09-20 NOTE — Assessment & Plan Note (Signed)
 Chronic Has difficulty falling asleep and staying asleep Does sleep sometimes during the day We body dementia is complicating this issue Trazodone, Sonata have not been effective Was taking Belsomra-not sure if this was helping or not Has not taken anything since she left the hospital Still having hallucinations and yelling and screaming out at night Encouraged good sleep hygiene Daughter will discuss with Dr. Donell Beers for his recommendations

## 2023-09-20 NOTE — Assessment & Plan Note (Addendum)
 Resolved Likely related to UTI in the setting of Lewy body dementia Symptoms improved during hospitalization with treatment of UTI and supportive care Discussed with daughter that she may not be able to tell if changes in mental status are related to an acute infection versus variation in her body dementia and should Gherghe.  To be evaluated if there is any signs of confusion or change in mental status

## 2023-09-20 NOTE — Assessment & Plan Note (Addendum)
 Acute Given abx in the hospital  Her daughter was not advised upon discharge that she was supposed to take duricef for an additional 4 days so she did not take this Denies any current symptoms, but typically does not have symptoms with an infection Discussed options of taking the antibiotic versus rechecking urine Will check UA, urine culture to see if she has an infection and if she does we will treat, but hold off on antibiotics at this time

## 2023-09-21 ENCOUNTER — Encounter: Payer: Self-pay | Admitting: Internal Medicine

## 2023-09-21 DIAGNOSIS — F411 Generalized anxiety disorder: Secondary | ICD-10-CM | POA: Diagnosis not present

## 2023-09-21 DIAGNOSIS — F02A3 Dementia in other diseases classified elsewhere, mild, with mood disturbance: Secondary | ICD-10-CM | POA: Diagnosis not present

## 2023-09-21 DIAGNOSIS — F02A18 Dementia in other diseases classified elsewhere, mild, with other behavioral disturbance: Secondary | ICD-10-CM | POA: Diagnosis not present

## 2023-09-21 DIAGNOSIS — F02A4 Dementia in other diseases classified elsewhere, mild, with anxiety: Secondary | ICD-10-CM | POA: Diagnosis not present

## 2023-09-21 DIAGNOSIS — G3183 Dementia with Lewy bodies: Secondary | ICD-10-CM | POA: Diagnosis not present

## 2023-09-21 DIAGNOSIS — G4700 Insomnia, unspecified: Secondary | ICD-10-CM | POA: Diagnosis not present

## 2023-09-21 DIAGNOSIS — F329 Major depressive disorder, single episode, unspecified: Secondary | ICD-10-CM | POA: Diagnosis not present

## 2023-09-21 DIAGNOSIS — E1140 Type 2 diabetes mellitus with diabetic neuropathy, unspecified: Secondary | ICD-10-CM | POA: Diagnosis not present

## 2023-09-21 LAB — URINE CULTURE

## 2023-09-24 ENCOUNTER — Ambulatory Visit (HOSPITAL_COMMUNITY): Admitting: Psychiatry

## 2023-09-24 DIAGNOSIS — E1140 Type 2 diabetes mellitus with diabetic neuropathy, unspecified: Secondary | ICD-10-CM | POA: Diagnosis not present

## 2023-09-24 DIAGNOSIS — F02A18 Dementia in other diseases classified elsewhere, mild, with other behavioral disturbance: Secondary | ICD-10-CM | POA: Diagnosis not present

## 2023-09-24 DIAGNOSIS — F02A3 Dementia in other diseases classified elsewhere, mild, with mood disturbance: Secondary | ICD-10-CM | POA: Diagnosis not present

## 2023-09-24 DIAGNOSIS — F411 Generalized anxiety disorder: Secondary | ICD-10-CM | POA: Diagnosis not present

## 2023-09-24 DIAGNOSIS — G3183 Dementia with Lewy bodies: Secondary | ICD-10-CM | POA: Diagnosis not present

## 2023-09-24 DIAGNOSIS — F329 Major depressive disorder, single episode, unspecified: Secondary | ICD-10-CM | POA: Diagnosis not present

## 2023-09-24 DIAGNOSIS — F02A4 Dementia in other diseases classified elsewhere, mild, with anxiety: Secondary | ICD-10-CM | POA: Diagnosis not present

## 2023-09-24 DIAGNOSIS — G4700 Insomnia, unspecified: Secondary | ICD-10-CM | POA: Diagnosis not present

## 2023-09-25 DIAGNOSIS — F329 Major depressive disorder, single episode, unspecified: Secondary | ICD-10-CM | POA: Diagnosis not present

## 2023-09-25 DIAGNOSIS — F02A18 Dementia in other diseases classified elsewhere, mild, with other behavioral disturbance: Secondary | ICD-10-CM | POA: Diagnosis not present

## 2023-09-25 DIAGNOSIS — F02A4 Dementia in other diseases classified elsewhere, mild, with anxiety: Secondary | ICD-10-CM | POA: Diagnosis not present

## 2023-09-25 DIAGNOSIS — F411 Generalized anxiety disorder: Secondary | ICD-10-CM | POA: Diagnosis not present

## 2023-09-25 DIAGNOSIS — F02A3 Dementia in other diseases classified elsewhere, mild, with mood disturbance: Secondary | ICD-10-CM | POA: Diagnosis not present

## 2023-09-25 DIAGNOSIS — G4700 Insomnia, unspecified: Secondary | ICD-10-CM | POA: Diagnosis not present

## 2023-09-25 DIAGNOSIS — G3183 Dementia with Lewy bodies: Secondary | ICD-10-CM | POA: Diagnosis not present

## 2023-09-25 DIAGNOSIS — E1140 Type 2 diabetes mellitus with diabetic neuropathy, unspecified: Secondary | ICD-10-CM | POA: Diagnosis not present

## 2023-10-01 ENCOUNTER — Encounter

## 2023-10-01 NOTE — Progress Notes (Signed)
 This encounter was created in error - please disregard.  I sent the link to pt on email and via text, with no respond.  I called pt x3 with no answer/ I, LDM for pt to call office back to reschedule.

## 2023-10-02 ENCOUNTER — Other Ambulatory Visit: Payer: Self-pay | Admitting: Internal Medicine

## 2023-10-03 DIAGNOSIS — F02A18 Dementia in other diseases classified elsewhere, mild, with other behavioral disturbance: Secondary | ICD-10-CM | POA: Diagnosis not present

## 2023-10-03 DIAGNOSIS — F02A3 Dementia in other diseases classified elsewhere, mild, with mood disturbance: Secondary | ICD-10-CM | POA: Diagnosis not present

## 2023-10-03 DIAGNOSIS — G4700 Insomnia, unspecified: Secondary | ICD-10-CM | POA: Diagnosis not present

## 2023-10-03 DIAGNOSIS — F02A4 Dementia in other diseases classified elsewhere, mild, with anxiety: Secondary | ICD-10-CM | POA: Diagnosis not present

## 2023-10-03 DIAGNOSIS — G3183 Dementia with Lewy bodies: Secondary | ICD-10-CM | POA: Diagnosis not present

## 2023-10-03 DIAGNOSIS — F411 Generalized anxiety disorder: Secondary | ICD-10-CM | POA: Diagnosis not present

## 2023-10-03 DIAGNOSIS — E1140 Type 2 diabetes mellitus with diabetic neuropathy, unspecified: Secondary | ICD-10-CM | POA: Diagnosis not present

## 2023-10-03 DIAGNOSIS — F329 Major depressive disorder, single episode, unspecified: Secondary | ICD-10-CM | POA: Diagnosis not present

## 2023-10-04 ENCOUNTER — Telehealth: Payer: Self-pay | Admitting: Internal Medicine

## 2023-10-04 DIAGNOSIS — F02A4 Dementia in other diseases classified elsewhere, mild, with anxiety: Secondary | ICD-10-CM | POA: Diagnosis not present

## 2023-10-04 DIAGNOSIS — E1140 Type 2 diabetes mellitus with diabetic neuropathy, unspecified: Secondary | ICD-10-CM | POA: Diagnosis not present

## 2023-10-04 DIAGNOSIS — G4700 Insomnia, unspecified: Secondary | ICD-10-CM | POA: Diagnosis not present

## 2023-10-04 DIAGNOSIS — G3183 Dementia with Lewy bodies: Secondary | ICD-10-CM | POA: Diagnosis not present

## 2023-10-04 DIAGNOSIS — F329 Major depressive disorder, single episode, unspecified: Secondary | ICD-10-CM | POA: Diagnosis not present

## 2023-10-04 DIAGNOSIS — F411 Generalized anxiety disorder: Secondary | ICD-10-CM | POA: Diagnosis not present

## 2023-10-04 DIAGNOSIS — F02A3 Dementia in other diseases classified elsewhere, mild, with mood disturbance: Secondary | ICD-10-CM | POA: Diagnosis not present

## 2023-10-04 DIAGNOSIS — F02A18 Dementia in other diseases classified elsewhere, mild, with other behavioral disturbance: Secondary | ICD-10-CM | POA: Diagnosis not present

## 2023-10-04 NOTE — Telephone Encounter (Signed)
 Copied from CRM [id number]. Topic: General - Other >> Oct 04, 2023 11:24 AM Alcus Dad wrote: Reason for CRM: Daughter Janet Le stated that patient needs letter stating she needs care taker or daughter to be the care taker. Patient has dementia Please call Melissa @ [phone]

## 2023-10-07 ENCOUNTER — Telehealth: Payer: Self-pay | Admitting: Internal Medicine

## 2023-10-07 DIAGNOSIS — F329 Major depressive disorder, single episode, unspecified: Secondary | ICD-10-CM | POA: Diagnosis not present

## 2023-10-07 DIAGNOSIS — E1140 Type 2 diabetes mellitus with diabetic neuropathy, unspecified: Secondary | ICD-10-CM | POA: Diagnosis not present

## 2023-10-07 DIAGNOSIS — F02A4 Dementia in other diseases classified elsewhere, mild, with anxiety: Secondary | ICD-10-CM | POA: Diagnosis not present

## 2023-10-07 DIAGNOSIS — G3183 Dementia with Lewy bodies: Secondary | ICD-10-CM | POA: Diagnosis not present

## 2023-10-07 DIAGNOSIS — F02A3 Dementia in other diseases classified elsewhere, mild, with mood disturbance: Secondary | ICD-10-CM | POA: Diagnosis not present

## 2023-10-07 DIAGNOSIS — F02A18 Dementia in other diseases classified elsewhere, mild, with other behavioral disturbance: Secondary | ICD-10-CM | POA: Diagnosis not present

## 2023-10-07 DIAGNOSIS — G4700 Insomnia, unspecified: Secondary | ICD-10-CM | POA: Diagnosis not present

## 2023-10-07 DIAGNOSIS — F411 Generalized anxiety disorder: Secondary | ICD-10-CM | POA: Diagnosis not present

## 2023-10-07 NOTE — Telephone Encounter (Unsigned)
 Copied from CRM [id number]. Topic: Clinical - Home Health Verbal Orders >> Oct 07, 2023  2:39 PM Carlatta H wrote: Caller/Agency: Marcos Sevin Number: [phone] Service Requested: Skilled Nursing Frequency: 1 week 3 and 2 prns with re certification Any new concerns about the patient? Yes Medical Child psychotherapist

## 2023-10-07 NOTE — Telephone Encounter (Signed)
 Message left for patient's daughter today.  If Melissa calls back please find out exactly what she is needing letter to say so we can see if this is something we are able to accommodate or not.

## 2023-10-08 DIAGNOSIS — G4700 Insomnia, unspecified: Secondary | ICD-10-CM | POA: Diagnosis not present

## 2023-10-08 DIAGNOSIS — F329 Major depressive disorder, single episode, unspecified: Secondary | ICD-10-CM | POA: Diagnosis not present

## 2023-10-08 DIAGNOSIS — E1140 Type 2 diabetes mellitus with diabetic neuropathy, unspecified: Secondary | ICD-10-CM | POA: Diagnosis not present

## 2023-10-08 DIAGNOSIS — F411 Generalized anxiety disorder: Secondary | ICD-10-CM | POA: Diagnosis not present

## 2023-10-08 DIAGNOSIS — F02A4 Dementia in other diseases classified elsewhere, mild, with anxiety: Secondary | ICD-10-CM | POA: Diagnosis not present

## 2023-10-08 DIAGNOSIS — G3183 Dementia with Lewy bodies: Secondary | ICD-10-CM | POA: Diagnosis not present

## 2023-10-08 DIAGNOSIS — F02A3 Dementia in other diseases classified elsewhere, mild, with mood disturbance: Secondary | ICD-10-CM | POA: Diagnosis not present

## 2023-10-08 DIAGNOSIS — F02A18 Dementia in other diseases classified elsewhere, mild, with other behavioral disturbance: Secondary | ICD-10-CM | POA: Diagnosis not present

## 2023-10-08 NOTE — Telephone Encounter (Signed)
 Okay for orders.  They requesting order for medical social worker as well?  Okay if they are-not sure if they need an official order or verbal okay

## 2023-10-08 NOTE — Telephone Encounter (Signed)
 Copied from CRM [id number]. Topic: General - Other >> Oct 07, 2023  4:00 PM Annelle Kiel wrote: Reason for CRM: patient daughter is needing a call back regarding a call from carla she missed

## 2023-10-08 NOTE — Telephone Encounter (Signed)
 Spoke with Pam and verbals given.

## 2023-10-09 ENCOUNTER — Encounter (HOSPITAL_COMMUNITY): Payer: Self-pay | Admitting: Licensed Clinical Social Worker

## 2023-10-09 ENCOUNTER — Telehealth: Payer: Self-pay | Admitting: Internal Medicine

## 2023-10-09 ENCOUNTER — Encounter (HOSPITAL_COMMUNITY): Payer: Self-pay

## 2023-10-09 ENCOUNTER — Ambulatory Visit (INDEPENDENT_AMBULATORY_CARE_PROVIDER_SITE_OTHER): Admitting: Licensed Clinical Social Worker

## 2023-10-09 DIAGNOSIS — F4323 Adjustment disorder with mixed anxiety and depressed mood: Secondary | ICD-10-CM

## 2023-10-09 DIAGNOSIS — F02A18 Dementia in other diseases classified elsewhere, mild, with other behavioral disturbance: Secondary | ICD-10-CM | POA: Diagnosis not present

## 2023-10-09 DIAGNOSIS — G4700 Insomnia, unspecified: Secondary | ICD-10-CM | POA: Diagnosis not present

## 2023-10-09 DIAGNOSIS — F02A4 Dementia in other diseases classified elsewhere, mild, with anxiety: Secondary | ICD-10-CM | POA: Diagnosis not present

## 2023-10-09 DIAGNOSIS — F411 Generalized anxiety disorder: Secondary | ICD-10-CM | POA: Diagnosis not present

## 2023-10-09 DIAGNOSIS — F02A3 Dementia in other diseases classified elsewhere, mild, with mood disturbance: Secondary | ICD-10-CM | POA: Diagnosis not present

## 2023-10-09 DIAGNOSIS — G3183 Dementia with Lewy bodies: Secondary | ICD-10-CM | POA: Diagnosis not present

## 2023-10-09 DIAGNOSIS — F329 Major depressive disorder, single episode, unspecified: Secondary | ICD-10-CM | POA: Diagnosis not present

## 2023-10-09 DIAGNOSIS — E1140 Type 2 diabetes mellitus with diabetic neuropathy, unspecified: Secondary | ICD-10-CM | POA: Diagnosis not present

## 2023-10-09 NOTE — Telephone Encounter (Signed)
Verbals left on voicemail today. 

## 2023-10-09 NOTE — Progress Notes (Signed)
 No show

## 2023-10-09 NOTE — Telephone Encounter (Signed)
 Okay for orders?

## 2023-10-09 NOTE — Telephone Encounter (Signed)
 Copied from CRM [id number]. Topic: Clinical - Home Health Verbal Orders >> Oct 08, 2023  4:23 PM Artemio Larry wrote: Caller/Agency: Ericka Hauser from [facility]  Callback Number: [phone] Service Requested: Speech Therapy Frequency: 1 WEEK 2 Any new concerns about the patient? No

## 2023-10-10 NOTE — Telephone Encounter (Signed)
 Pt's daughter has called in again, but would not give details, states she wants to speak with [facility] specifically

## 2023-10-10 NOTE — Telephone Encounter (Signed)
 Letter printed.

## 2023-10-10 NOTE — Telephone Encounter (Signed)
 Email: [email or web address]

## 2023-10-10 NOTE — Telephone Encounter (Signed)
 Spoke with daughter and email received.

## 2023-10-11 DIAGNOSIS — F02A3 Dementia in other diseases classified elsewhere, mild, with mood disturbance: Secondary | ICD-10-CM | POA: Diagnosis not present

## 2023-10-11 DIAGNOSIS — E1140 Type 2 diabetes mellitus with diabetic neuropathy, unspecified: Secondary | ICD-10-CM | POA: Diagnosis not present

## 2023-10-11 DIAGNOSIS — G3183 Dementia with Lewy bodies: Secondary | ICD-10-CM | POA: Diagnosis not present

## 2023-10-11 DIAGNOSIS — F329 Major depressive disorder, single episode, unspecified: Secondary | ICD-10-CM | POA: Diagnosis not present

## 2023-10-11 DIAGNOSIS — G4700 Insomnia, unspecified: Secondary | ICD-10-CM | POA: Diagnosis not present

## 2023-10-11 DIAGNOSIS — F02A18 Dementia in other diseases classified elsewhere, mild, with other behavioral disturbance: Secondary | ICD-10-CM | POA: Diagnosis not present

## 2023-10-11 DIAGNOSIS — F411 Generalized anxiety disorder: Secondary | ICD-10-CM | POA: Diagnosis not present

## 2023-10-11 DIAGNOSIS — F02A4 Dementia in other diseases classified elsewhere, mild, with anxiety: Secondary | ICD-10-CM | POA: Diagnosis not present

## 2023-10-15 DIAGNOSIS — F411 Generalized anxiety disorder: Secondary | ICD-10-CM | POA: Diagnosis not present

## 2023-10-15 DIAGNOSIS — F329 Major depressive disorder, single episode, unspecified: Secondary | ICD-10-CM | POA: Diagnosis not present

## 2023-10-15 DIAGNOSIS — F02A18 Dementia in other diseases classified elsewhere, mild, with other behavioral disturbance: Secondary | ICD-10-CM | POA: Diagnosis not present

## 2023-10-15 DIAGNOSIS — G4700 Insomnia, unspecified: Secondary | ICD-10-CM | POA: Diagnosis not present

## 2023-10-15 DIAGNOSIS — E1140 Type 2 diabetes mellitus with diabetic neuropathy, unspecified: Secondary | ICD-10-CM | POA: Diagnosis not present

## 2023-10-15 DIAGNOSIS — F02A3 Dementia in other diseases classified elsewhere, mild, with mood disturbance: Secondary | ICD-10-CM | POA: Diagnosis not present

## 2023-10-15 DIAGNOSIS — F02A4 Dementia in other diseases classified elsewhere, mild, with anxiety: Secondary | ICD-10-CM | POA: Diagnosis not present

## 2023-10-15 DIAGNOSIS — G3183 Dementia with Lewy bodies: Secondary | ICD-10-CM | POA: Diagnosis not present

## 2023-10-16 ENCOUNTER — Telehealth: Payer: Self-pay | Admitting: Internal Medicine

## 2023-10-16 DIAGNOSIS — F02A18 Dementia in other diseases classified elsewhere, mild, with other behavioral disturbance: Secondary | ICD-10-CM | POA: Diagnosis not present

## 2023-10-16 DIAGNOSIS — E1140 Type 2 diabetes mellitus with diabetic neuropathy, unspecified: Secondary | ICD-10-CM | POA: Diagnosis not present

## 2023-10-16 DIAGNOSIS — F02A4 Dementia in other diseases classified elsewhere, mild, with anxiety: Secondary | ICD-10-CM | POA: Diagnosis not present

## 2023-10-16 DIAGNOSIS — F411 Generalized anxiety disorder: Secondary | ICD-10-CM | POA: Diagnosis not present

## 2023-10-16 DIAGNOSIS — F02A3 Dementia in other diseases classified elsewhere, mild, with mood disturbance: Secondary | ICD-10-CM | POA: Diagnosis not present

## 2023-10-16 DIAGNOSIS — F329 Major depressive disorder, single episode, unspecified: Secondary | ICD-10-CM | POA: Diagnosis not present

## 2023-10-16 DIAGNOSIS — G4700 Insomnia, unspecified: Secondary | ICD-10-CM | POA: Diagnosis not present

## 2023-10-16 DIAGNOSIS — G3183 Dementia with Lewy bodies: Secondary | ICD-10-CM | POA: Diagnosis not present

## 2023-10-16 NOTE — Telephone Encounter (Signed)
 Copied from CRM [id number]. Topic: Clinical - Request for Lab/Test Order >> Oct 16, 2023 12:15 PM Taleah C wrote: Reason for CRM: patient called and requested to speak with Dr. Renato Carolus nurse to discuss the possibility of her receiving a new device to check her blood sugar that you're supposed to wear, as she explained. Please call back and advise.

## 2023-10-17 ENCOUNTER — Other Ambulatory Visit: Payer: Self-pay | Admitting: Internal Medicine

## 2023-10-17 DIAGNOSIS — F411 Generalized anxiety disorder: Secondary | ICD-10-CM | POA: Diagnosis not present

## 2023-10-17 DIAGNOSIS — G4700 Insomnia, unspecified: Secondary | ICD-10-CM | POA: Diagnosis not present

## 2023-10-17 DIAGNOSIS — F02A3 Dementia in other diseases classified elsewhere, mild, with mood disturbance: Secondary | ICD-10-CM | POA: Diagnosis not present

## 2023-10-17 DIAGNOSIS — E1140 Type 2 diabetes mellitus with diabetic neuropathy, unspecified: Secondary | ICD-10-CM | POA: Diagnosis not present

## 2023-10-17 DIAGNOSIS — E119 Type 2 diabetes mellitus without complications: Secondary | ICD-10-CM

## 2023-10-17 DIAGNOSIS — F02A18 Dementia in other diseases classified elsewhere, mild, with other behavioral disturbance: Secondary | ICD-10-CM | POA: Diagnosis not present

## 2023-10-17 DIAGNOSIS — F329 Major depressive disorder, single episode, unspecified: Secondary | ICD-10-CM | POA: Diagnosis not present

## 2023-10-17 DIAGNOSIS — G3183 Dementia with Lewy bodies: Secondary | ICD-10-CM | POA: Diagnosis not present

## 2023-10-17 DIAGNOSIS — F02A4 Dementia in other diseases classified elsewhere, mild, with anxiety: Secondary | ICD-10-CM | POA: Diagnosis not present

## 2023-10-18 ENCOUNTER — Emergency Department (HOSPITAL_COMMUNITY)

## 2023-10-18 ENCOUNTER — Emergency Department (HOSPITAL_COMMUNITY)
Admission: EM | Admit: 2023-10-18 | Discharge: 2023-10-18 | Disposition: A | Discharge status: Home / Self Care | Attending: Emergency Medicine | Admitting: Emergency Medicine

## 2023-10-18 ENCOUNTER — Ambulatory Visit: Payer: Self-pay

## 2023-10-18 ENCOUNTER — Other Ambulatory Visit: Payer: Self-pay

## 2023-10-18 ENCOUNTER — Encounter (HOSPITAL_COMMUNITY): Payer: Self-pay

## 2023-10-18 DIAGNOSIS — F0390 Unspecified dementia without behavioral disturbance: Secondary | ICD-10-CM | POA: Insufficient documentation

## 2023-10-18 DIAGNOSIS — R443 Hallucinations, unspecified: Secondary | ICD-10-CM | POA: Diagnosis not present

## 2023-10-18 DIAGNOSIS — R519 Headache, unspecified: Secondary | ICD-10-CM | POA: Diagnosis not present

## 2023-10-18 DIAGNOSIS — N390 Urinary tract infection, site not specified: Secondary | ICD-10-CM | POA: Diagnosis not present

## 2023-10-18 LAB — CBC WITH DIFFERENTIAL/PLATELET
Abs Immature Granulocytes: 0.07 10*3/uL (ref 0.00–0.07)
Basophils Absolute: 0 10*3/uL (ref 0.0–0.1)
Basophils Relative: 0 %
Eosinophils Absolute: 0 10*3/uL (ref 0.0–0.5)
Eosinophils Relative: 0 %
HCT: 43.2 % (ref 36.0–46.0)
Hemoglobin: 13.7 g/dL (ref 12.0–15.0)
Immature Granulocytes: 1 %
Lymphocytes Relative: 17 %
Lymphs Abs: 1.7 10*3/uL (ref 0.7–4.0)
MCH: 31.4 pg (ref 26.0–34.0)
MCHC: 31.7 g/dL (ref 30.0–36.0)
MCV: 99.1 fL (ref 80.0–100.0)
Monocytes Absolute: 0.6 10*3/uL (ref 0.1–1.0)
Monocytes Relative: 6 %
Neutro Abs: 7.9 10*3/uL — ABNORMAL HIGH (ref 1.7–7.7)
Neutrophils Relative %: 76 %
Platelets: 169 10*3/uL (ref 150–400)
RBC: 4.36 MIL/uL (ref 3.87–5.11)
RDW: 14.6 % (ref 11.5–15.5)
WBC: 10.4 10*3/uL (ref 4.0–10.5)
nRBC: 0 % (ref 0.0–0.2)

## 2023-10-18 LAB — COMPREHENSIVE METABOLIC PANEL WITH GFR
ALT: 13 U/L (ref 0–44)
AST: 18 U/L (ref 15–41)
Albumin: 3.7 g/dL (ref 3.5–5.0)
Alkaline Phosphatase: 88 U/L (ref 38–126)
Anion gap: 11 (ref 5–15)
BUN: 5 mg/dL — ABNORMAL LOW (ref 8–23)
CO2: 25 mmol/L (ref 22–32)
Calcium: 10.3 mg/dL (ref 8.9–10.3)
Chloride: 102 mmol/L (ref 98–111)
Creatinine, Ser: 1.09 mg/dL — ABNORMAL HIGH (ref 0.44–1.00)
GFR, Estimated: 55 mL/min — ABNORMAL LOW (ref >60)
Glucose, Bld: 101 mg/dL — ABNORMAL HIGH (ref 70–99)
Potassium: 3.3 mmol/L — ABNORMAL LOW (ref 3.5–5.1)
Sodium: 138 mmol/L (ref 135–145)
Total Bilirubin: 0.9 mg/dL (ref 0.0–1.2)
Total Protein: 7.1 g/dL (ref 6.5–8.1)

## 2023-10-18 LAB — URINALYSIS, W/ REFLEX TO CULTURE (INFECTION SUSPECTED)
Bilirubin Urine: NEGATIVE
Glucose, UA: NEGATIVE mg/dL
Hgb urine dipstick: NEGATIVE
Ketones, ur: 5 mg/dL — AB
Nitrite: NEGATIVE
Protein, ur: NEGATIVE mg/dL
Specific Gravity, Urine: 1.014 (ref 1.005–1.030)
pH: 5 (ref 5.0–8.0)

## 2023-10-18 LAB — CBG MONITORING, ED
Glucose-Capillary: 99 mg/dL (ref 70–99)
Glucose-Capillary: 70 mg/dL (ref 70–99)

## 2023-10-18 LAB — LIPASE, BLOOD: Lipase: 58 U/L — ABNORMAL HIGH (ref 11–51)

## 2023-10-18 MED ORDER — STERILE WATER FOR INJECTION IJ SOLN
INTRAMUSCULAR | Status: AC
  Filled 2023-10-18: qty 10

## 2023-10-18 MED ORDER — CEFTRIAXONE SODIUM 1 G IJ SOLR
1.0000 g | Freq: Once | INTRAMUSCULAR | Status: AC
  Administered 2023-10-18: 1 g via INTRAMUSCULAR
  Filled 2023-10-18: qty 10

## 2023-10-18 MED ORDER — CEFADROXIL 500 MG PO CAPS: 500.0000 mg | ORAL_CAPSULE | Freq: Two times a day (BID) | ORAL | 0 refills | Status: DC

## 2023-10-18 NOTE — ED Provider Notes (Signed)
 [place] EMERGENCY DEPARTMENT AT [facility] Provider Note   CSN: [id number] Arrival date & time: 10/18/23  1158     History  Chief Complaint  Patient presents with   Headache    Janet Le is a 68 y.o. female history of dementia, UTI, here presenting with headache and hallucinations.  Patient states that she has been having headaches since yesterday.  She also states that she has been hearing voices.  She states that sometimes it is birds chirping and other times it is people talking but she could not make out the sound.  She adamantly denies the voices talking to her or suicidal homicidal ideations.  Patient also was recently admitted for dementia and hallucinations and was found to have a UTI.  She was given Rocephin  in the hospital and eventually discharged with Duricef but did not take them.  Patient has no fevers.  She did not complain of any abdominal pain initially but told me that she has some suprapubic pain.  The history is provided by the patient.       Home Medications Prior to Admission medications   Medication Sig Start Date End Date Taking? Authorizing Provider  cefadroxil  (DURICEF) 500 MG capsule Take 1 capsule (500 mg total) by mouth 2 (two) times daily for 7 days. 10/18/23 10/25/23 Yes Dalene Duck, MD  acetaminophen  (TYLENOL ) 500 MG tablet Take 1,000 mg by mouth every 6 (six) hours as needed for mild pain (pain score 1-3) or headache.    [provider]  atorvastatin  (LIPITOR) 10 MG tablet TAKE ONE TABLET BY MOUTH DAILY AT 5PM 03/25/23   Colene Dauphin, MD  dicyclomine  (BENTYL ) 10 MG capsule Take 10-20 mg by mouth 2 (two) times daily as needed. 08/16/23   [provider]  DULoxetine  (CYMBALTA ) 60 MG capsule Take 60 mg by mouth daily.    [provider]  fluticasone  (FLONASE ) 50 MCG/ACT nasal spray Place 2 sprays into both nostrils daily. 08/01/23   Colene Dauphin, MD  folic acid  (FOLVITE ) 1 MG tablet TAKE ONE TABLET BY  MOUTH DAILY AT 9AM 04/05/23   Colene Dauphin, MD  furosemide  (LASIX ) 20 MG tablet TAKE ONE TABLET BY MOUTH DAILY AT 9AM 10/30/22   Colene Dauphin, MD  linaclotide  (LINZESS ) 72 MCG capsule Take 1 capsule (72 mcg total) by mouth daily before breakfast. For constipation Patient taking differently: Take 72 mcg by mouth daily as needed. For constipation 07/29/23   Colene Dauphin, MD  OZEMPIC , 1 MG/DOSE, 4 MG/3ML SOPN INJECT 1 MG  SUBCUTANEOUSLY ONCE A WEEK 10/17/23   Colene Dauphin, MD  propranolol  (INDERAL ) 40 MG tablet Take 1 tablet (40 mg total) by mouth 2 (two) times daily. 09/20/23   Colene Dauphin, MD  rivastigmine  (EXELON ) 6 MG capsule Take 1 capsule (6 mg total) by mouth 2 (two) times daily. 08/08/23   Merriam Abbey, DO  Suvorexant  (BELSOMRA ) 15 MG TABS Take 1 tablet (15 mg total) by mouth at bedtime as needed. 08/01/23   Colene Dauphin, MD  telmisartan  (MICARDIS ) 80 MG tablet TAKE ONE TABLET BY MOUTH DAILY AT 9AM 10/02/23   Colene Dauphin, MD      Allergies    Sertraline hcl, Ace inhibitors, Codeine, Darifenacin hydrobromide er, Gabapentin , Pravastatin , Propoxyphene hcl, Seroquel  [quetiapine ], and Wellbutrin [bupropion hcl]    Review of Systems   Review of Systems  Genitourinary:  Positive for difficulty urinating.  Neurological:  Positive for headaches.  All other systems reviewed and are negative.   Physical Exam Updated Vital Signs BP 115/70 (BP Location: Left Arm)   Pulse 84   Temp 97.9 F (36.6 C) (Oral)   Resp 17   Ht 5\' 2"  (1.575 m)   Wt 88.5 kg   SpO2 97%   BMI 35.67 kg/m  Physical Exam Vitals and nursing note reviewed.  Constitutional:      Comments: Chronically ill-appearing but not acutely ill  HENT:     Head: Normocephalic.     Mouth/Throat:     Mouth: Mucous membranes are moist.  Eyes:     Extraocular Movements: Extraocular movements intact.     Pupils: Pupils are equal, round, and reactive to light.  Cardiovascular:     Rate and Rhythm: Normal rate and regular  rhythm.  Pulmonary:     Effort: Pulmonary effort is normal.     Breath sounds: Normal breath sounds.  Abdominal:     General: Bowel sounds are normal.     Palpations: Abdomen is soft.     Comments: Mild suprapubic tenderness  Musculoskeletal:        General: Normal range of motion.  Skin:    General: Skin is warm.  Neurological:     Mental Status: She is alert.  Psychiatric:        Mood and Affect: Mood normal.     ED Results / Procedures / Treatments   Labs (all labs ordered are listed, but only abnormal results are displayed) Labs Reviewed  CBC WITH DIFFERENTIAL/PLATELET - Abnormal; Notable for the following components:      Result Value   Neutro Abs 7.9 (*)    All other components within normal limits  COMPREHENSIVE METABOLIC PANEL WITH GFR - Abnormal; Notable for the following components:   Potassium 3.3 (*)    Glucose, Bld 101 (*)    BUN 5 (*)    Creatinine, Ser 1.09 (*)    GFR, Estimated 55 (*)    All other components within normal limits  LIPASE, BLOOD - Abnormal; Notable for the following components:   Lipase 58 (*)    All other components within normal limits  URINALYSIS, W/ REFLEX TO CULTURE (INFECTION SUSPECTED) - Abnormal; Notable for the following components:   APPearance HAZY (*)    Ketones, ur 5 (*)    Leukocytes,Ua TRACE (*)    Bacteria, UA FEW (*)    All other components within normal limits  URINE CULTURE  CBG MONITORING, ED  CBG MONITORING, ED    EKG None  Radiology CT Head Wo Contrast Result Date: 10/18/2023 CLINICAL DATA:  68 year old female with headache, auditory hallucination. EXAM: CT HEAD WITHOUT CONTRAST TECHNIQUE: Contiguous axial images were obtained from the base of the skull through the vertex without intravenous contrast. RADIATION DOSE REDUCTION: This exam was performed according to the departmental dose-optimization program which includes automated exposure control, adjustment of the mA and/or kV according to patient size and/or  use of iterative reconstruction technique. COMPARISON:  Head CT 09/13/2023.  Brain MRI 12/19/2013. FINDINGS: Brain: Stable brain volume, within normal limits for age. No midline shift, ventriculomegaly, mass effect, evidence of mass lesion, intracranial hemorrhage or evidence of cortically based acute infarction. Gray-white differentiation stable and within normal limits for age. Vascular: No suspicious intracranial vascular hyperdensity. Skull: Stable. No acute osseous abnormality identified. Calvarium intact. Sinuses/Orbits: Visualized paranasal sinuses and mastoids are stable and well aerated. Tympanic cavities appear clear. Other: No acute orbit or scalp soft tissue finding identified.  IMPRESSION: Stable and normal for age noncontrast CT appearance of the Brain. Electronically Signed   By: Marlise Simpers M.D.   On: 10/18/2023 15:17    Procedures Procedures    Medications Ordered in ED Medications  cefTRIAXone  (ROCEPHIN ) injection 1 g (has no administration in time range)  sterile water  (preservative free) injection (has no administration in time range)    ED Course/ Medical Decision Making/ A&P                                 Medical Decision Making SHAANA ACOCELLA is a 68 y.o. female here presenting with urinary frequency and hallucinations.  Patient has chronic hallucinations and has been evaluated previously.  Patient is not suicidal or homicidal.  CT head was done in triage and was unremarkable.  CBC and CMP unremarkable.  UA showed UTI.  Urine culture sent.  Patient was given Rocephin  and will be discharged home with a course of Duricef.  Daughter to pick patient up.   Problems Addressed: Hallucinations: chronic illness or injury Nonintractable headache, unspecified chronicity pattern, unspecified headache type: acute illness or injury Urinary tract infection without hematuria, site unspecified: acute illness or injury  Amount and/or Complexity of Data Reviewed Labs: ordered.  Decision-making details documented in ED Course. Radiology: ordered and independent interpretation performed. Decision-making details documented in ED Course.  Risk Prescription drug management.    Final Clinical Impression(s) / ED Diagnoses Final diagnoses:  Nonintractable headache, unspecified chronicity pattern, unspecified headache type  Urinary tract infection without hematuria, site unspecified  Hallucinations    Rx / DC Orders ED Discharge Orders          Ordered    cefadroxil  (DURICEF) 500 MG capsule  2 times daily        10/18/23 2112              Dalene Duck, MD 10/18/23 2120

## 2023-10-18 NOTE — Discharge Instructions (Addendum)
 Please take Duricef twice daily for a week for urinary tract infection  You need to follow-up with your doctor regarding your hallucinations  As we discussed your kidney function is normal and your CT head is normal today  Return to ER if you have worse hallucinations or headache or vomiting or abdominal pain or fever

## 2023-10-18 NOTE — Telephone Encounter (Signed)
  Chief Complaint: urinary symptoms  Symptoms: frequent urination, headache, confusion, hearing and seeing things numbness in feet  Frequency: yesterday  Pertinent Negatives: Patient denies current hallucinations, fever, flank pain Disposition: [x] ED /[] Urgent Care (no appt availability in office) / [] Appointment(In office/virtual)/ []  [place] [facility]/ [] Home Care/ [] Refused Recommended Disposition /[] [place] [facility]/ []  Follow-up with PCP Additional Notes: Patient reports she has been experiencing urinary frequency and hallucinations since yesterday. Patient reports yesterday she was hearing and seeing things that weren't there. Patient reports she has not had any hallucinations so far today but that she did wake up with a headache. Patient reports she believes her headache was due to not eating and did subside once she ate. Patient reports she is still experiencing urinary frequency today and numbness in her feet. Patient reports the numbness in her feet is baseline for her as she is diabetic. Due to patients symptoms and hallucinations, this RN advised ED at this time for further eval. Patient reports she will have a family member bring her. Advised patient to call back with worsening symptoms. Patient verbalized understanding.      Copied from CRM [id number]. Topic: Clinical - Red Word Triage >> Oct 18, 2023  9:49 AM Luane Rumps D wrote: Red Word that prompted transfer to Nurse Triage: UTI symptoms, frequent urination, hears whispering and headache. Reason for Disposition  Seeing, hearing, or feeling things that are not there (i.e., visual, auditory, or tactile hallucinations)  Answer Assessment - Initial Assessment Questions 1. LEVEL OF CONSCIOUSNESS: "How is he (she, the patient) acting right now?" (e.g., alert-oriented, confused, lethargic, stuporous, comatose)     Confusion, hearing whispering & music 2. ONSET: "When did the confusion start?"  (minutes, hours, days)      yesterday 3. PATTERN "Does this come and go, or has it been constant since it started?"  "Is it present now?"     constant 4. ALCOHOL or DRUGS: "Has he been drinking alcohol or taking any drugs?"      denies 5. NARCOTIC MEDICINES: "Has he been receiving any narcotic medications?" (e.g., morphine , Vicodin)     denies 6. CAUSE: "What do you think is causing the confusion?"      uti 7. OTHER SYMPTOMS: "Are there any other symptoms?" (e.g., difficulty breathing, headache, fever, weakness)     Urinary frequency, headache  Protocols used: Confusion - Delirium-A-AH

## 2023-10-18 NOTE — ED Triage Notes (Signed)
 Pt states she had a headache last night and started hearing voices that were not there and seeing stuff that wasn't there. Pt states she also woke up talking this am. Pt called her MD and MD told her to come here. Pt had a UTI last month. Pt states she has had urinary frequency and some pain in vaginal area.

## 2023-10-18 NOTE — ED Provider Triage Note (Signed)
 Emergency Medicine Provider Triage Evaluation Note  Janet Le , a 68 y.o. female  was evaluated in triage.  Pt complains of hearing voices along with a headache last night and dysuria.  Patient was recently admitted for UTI causing encephalopathy and states she did not finish the antibiotics prescribed to her.  Patient did not endorse headache with me but did with triage.  Patient dates at last night she was hearing voices in which they are whispering but cannot make out what the voices were saying.  Patient is on escitalopram .  Patient denies SI or HI.  Patient believes she still has UTI as she still has dysuria.  Review of Systems  Positive:  Negative:   Physical Exam  BP 132/81 (BP Location: Right Arm)   Pulse 85   Temp 98 F (36.7 C)   Resp 18   Ht 5\' 2"  (1.575 m)   Wt 88.5 kg   SpO2 100%   BMI 35.67 kg/m  Gen:   Awake, no distress   Resp:  Normal effort  MSK:   Moves extremities without difficulty  Other:  Some right upper quadrant tenderness however no peritoneal signs, no overlying skin color changes  Medical Decision Making  Medically screening exam initiated at 2:05 PM.  Appropriate orders placed.  DARLETTE DUBOW was informed that the remainder of the evaluation will be completed by another provider, this initial triage assessment does not replace that evaluation, and the importance of remaining in the ED until their evaluation is complete.  Workup initiated, patient stable.   Denese Finn, PA-C 10/18/23 1406

## 2023-10-20 LAB — URINE CULTURE

## 2023-10-22 ENCOUNTER — Other Ambulatory Visit: Payer: Self-pay | Admitting: Internal Medicine

## 2023-10-23 NOTE — Telephone Encounter (Signed)
 Message left for patient today to return call to clinic to let me know what she needs exactly.  We previously sent in the Freestyle 3 with sensors.

## 2023-10-24 NOTE — Progress Notes (Signed)
 Virtual Visit via Video Note  I connected with Janet Le on 10/24/23 at  9:30 AM EDT by a video enabled telemedicine application and verified that I am speaking with the correct person using two identifiers.   I discussed the limitations of evaluation and management by telemedicine and the availability of in person appointments. The patient expressed understanding and agreed to proceed.  Present for the visit:  Myself, Dr Oma Bias, [facility].  The patient is currently at home and I am in the office.    No referring provider.    History of Present Illness: She went to the ED 4/25 for headache and hallucinations.  Her headache stared the day prior.  Stated hallucination which are not new.  UA with ? Infection, urine culture with multiple species.  Cbc normal.  Cmp with slightly low K, mild dec GFR.  Lipase was elevated.  CT head stable.  Received ceftriaxone  1 g.  Discharged home with a course of Duricef   No headaches since then.  Reviewed CT scan  UTI ? --- cx with multiple species.  She is not sure if she took the abx that she was discharged home with.  She is not sure if she started anything new.  She did ask her daughter when she was not aware of any medication she was sent home with.  She is staying well hydrated - drinks water  and juice.  She denies any urine symptoms.   Review of Systems  Constitutional:  Negative for fever.  Respiratory:  Negative for cough and shortness of breath.   Cardiovascular:  Positive for chest pain (right sided - going on for a while) and leg swelling (chronic). Negative for palpitations.  Gastrointestinal:  Negative for abdominal pain.  Genitourinary:  Positive for frequency. Negative for dysuria and hematuria.       No cloudiness in urine      Social History   Socioeconomic History   Marital status: Legally Separated    Spouse name: Not on file   Number of children: 4   Years of education: 12   Highest education level: High school  graduate  Occupational History   Occupation: Disabled  Tobacco Use   Smoking status: Never    Passive exposure: Past   Smokeless tobacco: Never  Vaping Use   Vaping status: Never Used  Substance and Sexual Activity   Alcohol use: No   Drug use: No   Sexual activity: Never  Other Topics Concern   Not on file  Social History Narrative   Right handed   Lives in a two story apartment building    Social Drivers of Health   Financial Resource Strain: Low Risk  (03/30/2022)   Overall Financial Resource Strain (CARDIA)    Difficulty of Paying Living Expenses: Not hard at all  Food Insecurity: No Food Insecurity (09/15/2023)   Hunger Vital Sign    Worried About Running Out of Food in the Last Year: Never true    Ran Out of Food in the Last Year: Never true  Transportation Needs: No Transportation Needs (09/15/2023)   PRAPARE - Administrator, Civil Service (Medical): No    Lack of Transportation (Non-Medical): No  Physical Activity: Inactive (03/30/2022)   Exercise Vital Sign    Days of Exercise per Week: 0 days    Minutes of Exercise per Session: 0 min  Stress: No Stress Concern Present (03/30/2022)   [employer] of Occupational Health - Occupational Stress Questionnaire  Feeling of Stress : Not at all  Social Connections: Moderately Integrated (09/15/2023)   Social Connection and Isolation Panel [NHANES]    Frequency of Communication with Friends and Family: Twice a week    Frequency of Social Gatherings with Friends and Family: More than three times a week    Attends Religious Services: More than 4 times per year    Active Member of [employer] or Organizations: Yes    Attends Engineer, structural: More than 4 times per year    Marital Status: Separated     Observations/Objective: Appears well in NAD Breathing normally Skin appears warm dry  Assessment and Plan:  See Problem List for Assessment and Plan of chronic medical problems.   Follow Up  Instructions:    I discussed the assessment and treatment plan with the patient. The patient was provided an opportunity to ask questions and all were answered. The patient agreed with the plan and demonstrated an understanding of the instructions.   The patient was advised to call back or seek an in-person evaluation if the symptoms worsen or if the condition fails to improve as anticipated.    Colene Dauphin, MD

## 2023-10-25 ENCOUNTER — Encounter: Payer: Self-pay | Admitting: Internal Medicine

## 2023-10-25 ENCOUNTER — Telehealth (INDEPENDENT_AMBULATORY_CARE_PROVIDER_SITE_OTHER): Admitting: Internal Medicine

## 2023-10-25 ENCOUNTER — Telehealth: Payer: Self-pay | Admitting: Neurology

## 2023-10-25 DIAGNOSIS — R0781 Pleurodynia: Secondary | ICD-10-CM | POA: Diagnosis not present

## 2023-10-25 DIAGNOSIS — R600 Localized edema: Secondary | ICD-10-CM | POA: Diagnosis not present

## 2023-10-25 DIAGNOSIS — N3000 Acute cystitis without hematuria: Secondary | ICD-10-CM | POA: Diagnosis not present

## 2023-10-25 DIAGNOSIS — F02A3 Dementia in other diseases classified elsewhere, mild, with mood disturbance: Secondary | ICD-10-CM | POA: Diagnosis not present

## 2023-10-25 DIAGNOSIS — G3183 Dementia with Lewy bodies: Secondary | ICD-10-CM

## 2023-10-25 NOTE — Assessment & Plan Note (Signed)
?    Recent UTI UA had small amount of leuk esterase, urine culture showed multiple species She did receive ceftriaxone  in ED She was prescribed Duricef, but I do not think they ever got this prescription or she took it Denies any urinary symptoms now and is mentally at baseline I am not convinced she had a UTI-I think it was a bad specimen Advised that if she has any symptoms of possible infection she should call us  so that we can check her urine

## 2023-10-25 NOTE — Telephone Encounter (Signed)
 Missing orders that need to be signed were sent over and ready to be signed.

## 2023-10-25 NOTE — Telephone Encounter (Signed)
 Left message with the after hour service on 10-25-23   Caller states that she needs to speak with someone about  outstanding orders that were sent to us  and they have not gotten them back

## 2023-10-25 NOTE — Assessment & Plan Note (Signed)
 Chronic Following with Dr. Festus Hubert Does have hallucinations Currently on Cymbalta  60 mg daily Currently taking exelon  6 mg bid

## 2023-10-25 NOTE — Assessment & Plan Note (Signed)
 Chronic States pain is under right breast and chest Has had this pain for a while X-ray in the past has been negative Unsure what is causing the change Will refer to sports medicine for further evaluation

## 2023-10-25 NOTE — Assessment & Plan Note (Signed)
 Chronic She does have leg swelling and states that it is stable She asked about being referred to the lymphedema clinic, but she has never been diagnosed with lymphedema which I explained to her Continue furosemide  20 mg daily

## 2023-10-26 ENCOUNTER — Other Ambulatory Visit: Payer: Self-pay | Admitting: Internal Medicine

## 2023-10-26 DIAGNOSIS — E119 Type 2 diabetes mellitus without complications: Secondary | ICD-10-CM

## 2023-10-28 ENCOUNTER — Ambulatory Visit (INDEPENDENT_AMBULATORY_CARE_PROVIDER_SITE_OTHER): Admitting: Physician Assistant

## 2023-10-28 ENCOUNTER — Encounter: Payer: Self-pay | Admitting: Physician Assistant

## 2023-10-28 VITALS — BP 97/68 | HR 75 | Resp 20 | Ht 62.0 in | Wt 196.0 lb

## 2023-10-28 DIAGNOSIS — G3183 Dementia with Lewy bodies: Secondary | ICD-10-CM

## 2023-10-28 DIAGNOSIS — F02A3 Dementia in other diseases classified elsewhere, mild, with mood disturbance: Secondary | ICD-10-CM | POA: Diagnosis not present

## 2023-10-28 MED ORDER — DIVALPROEX SODIUM 125 MG PO DR TAB: DELAYED_RELEASE_TABLET | ORAL | 3 refills | Status: DC

## 2023-10-28 NOTE — Progress Notes (Signed)
 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 RH female with a history of HTN, DM2, TB, IBS, lymphedema, OA, osteoporosis, RCC,  mild LBD with some behavioral disturbance seen today in follow up for memory loss. Patient is currently on rivastigmine  6 mg bid, tolerating well. Memory remains stable. Mood is controlled. Hallucinations are still present, discussed starting Depakote 125 mg qhs, patient and daughter are interested.     Follow up in  6 months. Continue  rivastigmine  6 mg bid.  Side effects discussed  Continue citalopram  10 mg daily for anxiety, side effects discussed Continue trazodone  100 mg nightly for sleep  Recommend good control of her cardiovascular risk factors Continue to control mood as per PCP No further driving Agree with HHN       Subjective:    This patient is accompanied in the office by her daughter who supplements the history.  Previous records as well as any outside records available were reviewed prior to todays visit. Patient was last seen on 02/04/23    Any changes in memory since last visit? "Memory is a tiny bit worse, but hallucinations are worse".-daughter says.  repeats oneself?  Endorsed, frequently Disoriented when walking into a room?  Patient denies    Leaving objects?  May misplace things, such as the remote but not in unusual places-except one episode when she left the purse on the bottom drawer and could not find it  Wandering behavior?  denies   Any personality changes since last visit?  Continues to have some anxiety and MDD controlled with meds. She becomes tearful at times during the visit. Hallucinations or paranoia?  Endorsed, she sees " chicken is served to the other chickens, hearing music, people talking and Grand-kids cooking popcorn "  Seizures? denies    Any sleep changes? " Still bothering me. If she does not sleep well her behavior is worse, more hallucinations would  be present, memory may be affected too"-daughter says   Reports  vivid dreams, she acts out by having a regular conversation without other REM behavior or sleepwalking. She spends a lot of time on the bed because she does not socialize as much      Sleep apnea? She does not know.   Any hygiene concerns? "Recently for the last month, she has a hard time reaching to certain places and then she gets a UTI "-daughter says Independent of bathing and dressing?  Endorsed  Does the patient needs help with medications?  Daughter is in charge   Who is in charge of the finances?  daughter is in charge     Any changes in appetite?  denies     Patient have trouble swallowing? Denies.   Does the patient cook? No Any headaches? Yes BP related, most recently on 10/18/23 presenting to the ED, negative acute workup. She has multilevel DJD of the C spine  more pronounced at C4-6  without acute fracture. Does not drink plenty water   Vision changes? She sees some lights sometimes.  Chronic back pain  denies   Ambulates with difficulty? She uses a cane for stability when she goes out, not at home. Sometimes she uses a walker.   Recent falls or head injuries? denies     Unilateral weakness, numbness or tingling? She has a history of peripheral neuropathy with extensive workup in the past , chronic pain, DJD, and spasms, knee pain. Limiting her mobility.  Any  tremors? She has a history of B hand tremors controlled with propanolol.  Any anosmia?  Denies   Any incontinence of urine?  Endorsed  On 09/13/23 she had UTI.  Any bowel dysfunction?She has a history of IBS,     Patient lives with her daughter    Does the patient drive? No longer drives    HISTORY: I.  LEWY BODY DEMENTIA: In 2014, she started experiencing visual disturbance.  She describes initially seeing a line of light along the bottom of her visual field in the left eye.  She then developed what looks like bubbles floating up in the temporal aspect of the  visual fields of in both eyes.  It lasts only a few seconds and occurs several times a day.  She also began experiencing dizziness and lightheadedness and was found to have orthostatic hypotension.  In 2018, she began noticing memory problems.  Labs from May 2019 included normal B12 528 and TSH 1.37.  She has been treated for latent TB.   Around this time, she began experiencing tremors in the hands and occasional jerks of her extremities.   She underwent neuropsychological testing 07/09/18.  She demonstrated probable deficits of processing speed, executive function, visual memory significantly more than verbal memory, and Theatre stage manager.  It was noted that such a profile may be seen in early Parkinson's disease or a Parkinson's plus syndrome.  However, her performance may have been skewed due to observed anxiety and mildly suboptimal task persistence.  In March 2022, she was confused and hallucinating.  She was looking out of the house at her car and looked like there was somebody in her car.  When she went out to her car, nobody was there.  She started seeing people, strangers, not there in her house. She said that they were coming in through the walls.  She may quickly think she sees something (like a figure) and when she turns her head to that direction, it is gone.    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  She underwent repeat neuropsychological evaluation on 01/25/2021.  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  Given her history of gait instability, hallucinations, and tremor, Lewy body dementia suspected. MRI of brain on 12/19/2013 showed small 11 mm left frontal dural calcification vs meningioma at the left vertex.  MRI of brain on 10/05/2020 which showed stable 11 mm left frontal convexity lesion (probable meningioma), minimal chronic small vessel disease.  Insurance wouldn't  approve DaT scan.  Repeat neuropsychological evaluation on 01/14/2023 demonstrated cognitive decline across all domains, especially executive functioning and aspects of learning and memory, still concerning for Lewy body disease.     Past medications:  quetiapine  (increased hallucinations and agitation), trazodone  (ineffective, excessive daytime drowsiness), duloxetine    II.  NEUROPATHY:  In 2016, she started experiencing burning pain from the bottom of her feet up the back of her legs to the knees.  It is intermittent and lasts for various and unknown period of time, but not aware of any triggers such as due to position.  In addition, she is reporting increased back pain and spasms, worse when sitting and improved when laying supine.  She has a history of chronic low back pain with left lumbar radiculopathy, as well as right knee pain.  She has been seen and evaluated by orthopedics and pain management.  MRI of lumbar spine from 07/27/16 revealed  advanced L4-5 and L5-S1 facet arthrosis with some bilateral neural foraminal stenosis.  Dr. Rozelle Corning of orthopedics did not think surgery was an option at that time.  TSH from 10/15/16 was 1.67.  B12 from 10/18/16 was 537.  Hgb A1c from 04/05/17 was 5.6.  NCV-EMG from 10/25/16 demonstrated no evidence of large fiber sensorimotor polyneuropathy or lumbosacral radiculopathy.  MRI of brain and cervical spine on 10/05/2020 which showed stable 11 mm left frontal convexity meningioma, minimal chronic small vessel disease, moderate cervical spondylosis from C4 through C7 and small central disc protrusion at C7-T1 but no acute intracranial abnormality or spinal stenosis or cord abnormality.   Labs from 11/02/2020 include negative ANA, sed rate 16, ACE 23, SPEP/IFE negative for M-spike.  NCV-EMG on 11/22/2020 now demonstrated chronic sensorimotor axonal polyneuropathy.  PREVIOUS MEDICATIONS:   CURRENT MEDICATIONS:  Outpatient Encounter Medications as of 10/28/2023  Medication Sig    acetaminophen  (TYLENOL ) 500 MG tablet Take 1,000 mg by mouth every 6 (six) hours as needed for mild pain (pain score 1-3) or headache.   atorvastatin  (LIPITOR) 10 MG tablet TAKE ONE TABLET BY MOUTH DAILY AT 5PM   citalopram  (CELEXA ) 10 MG tablet Take 10 mg by mouth every morning.   dicyclomine  (BENTYL ) 10 MG capsule Take 1 capsule (10 mg total) by mouth 2 (two) times daily as needed (intestinal spasms).   divalproex (DEPAKOTE) 125 MG DR tablet Take oe tab at night   DULoxetine  (CYMBALTA ) 60 MG capsule Take 60 mg by mouth daily.   fluticasone  (FLONASE ) 50 MCG/ACT nasal spray Place 2 sprays into both nostrils daily.   folic acid  (FOLVITE ) 1 MG tablet TAKE ONE TABLET BY MOUTH DAILY AT 9AM   furosemide  (LASIX ) 20 MG tablet TAKE ONE TABLET BY MOUTH DAILY AT 9AM   linaclotide  (LINZESS ) 72 MCG capsule Take 1 capsule (72 mcg total) by mouth daily before breakfast. For constipation (Patient taking differently: Take 72 mcg by mouth daily as needed. For constipation)   propranolol  (INDERAL ) 40 MG tablet Take 1 tablet (40 mg total) by mouth 2 (two) times daily.   rivastigmine  (EXELON ) 6 MG capsule Take 1 capsule (6 mg total) by mouth 2 (two) times daily.   Suvorexant  (BELSOMRA ) 15 MG TABS Take 1 tablet (15 mg total) by mouth at bedtime as needed.   telmisartan  (MICARDIS ) 80 MG tablet TAKE ONE TABLET BY MOUTH DAILY AT 9AM   OZEMPIC , 1 MG/DOSE, 4 MG/3ML SOPN INJECT 1 MG SUBCUTANEOUSLY ONCE A WEEK (Patient not taking: Reported on 10/28/2023)   No facility-administered encounter medications on file as of 10/28/2023.       10/28/2023    6:00 PM 06/16/2018   10:57 AM  MMSE - Mini Mental State Exam  Orientation to time 3 5  Orientation to Place 4 5  Registration 3 3  Attention/ Calculation 5 3  Recall 2 2  Language- name 2 objects 2 2  Language- repeat 1 1  Language- follow 3 step command 3 3  Language- read & follow direction 1 1  Write a sentence 1 1  Copy design 0 1  Total score 25 27       No data  to display          Objective:     PHYSICAL EXAMINATION:    VITALS:   Vitals:   10/28/23 1336  BP: 97/68  Pulse: 75  Resp: 20  SpO2: 98%  Weight: 196 lb (88.9 kg)  Height: 5\' 2"  (1.575 m)    GEN:  The patient appears stated age and is in NAD. HEENT:  Normocephalic, atraumatic.   Neurological examination:  General: NAD, well-groomed, appears stated age. Orientation: The patient is alert. Oriented to person, place and not to date Cranial nerves: There is good facial symmetry.The speech is fluent and clear, tangential at times, soft spoken. No aphasia or dysarthria. Fund of knowledge is appropriate. Recent and remote memory are impaired. Attention and concentration are reduced.  Able to name objects and repeat phrases.  Hearing is intact to conversational tone.  Sensation: Sensation is intact to light touch throughout Motor: Strength is at least antigravity x4. DTR's 1/4 in UE/LE     Movement examination: Tone: There is normal tone in the UE/LE Abnormal movements:  no tremor was noted today.  No myoclonus.  No asterixis.   Coordination:  There is no decremation with RAM's. Normal finger to nose  Gait and Station: The patient has some difficulty arising out of a deep-seated chair without the use of the hands. Uses a cane for stability. The patient's stride length is short  Gait is cautious and mildly broad based   Thank you for allowing us  the opportunity to participate in the care of this nice patient. Please do not hesitate to contact us  for any questions or concerns.   Total time spent on today's visit was 30 minutes dedicated to this patient today, preparing to see patient, examining the patient, ordering tests and/or medications and counseling the patient, documenting clinical information in the EHR or other health record, independently interpreting results and communicating results to the patient/family, discussing treatment and goals, answering patient's questions and  coordinating care.  Cc:  Colene Dauphin, MD  Tex Filbert 10/28/2023 6:45 PM

## 2023-10-31 ENCOUNTER — Other Ambulatory Visit: Payer: Self-pay | Admitting: Internal Medicine

## 2023-11-06 ENCOUNTER — Telehealth: Payer: Self-pay

## 2023-11-06 NOTE — Telephone Encounter (Signed)
 This patient is appearing on a report for being at risk of failing the adherence measure for diabetes medications this calendar year.   Medication: Semaglutide  1mg  weekly Last fill date: 09/20/23 for 28 day supply  Patient had run out of Ozempic  and was waiting on refills to be sent. Refills sent to [facility] pharmacy on 10/28/23 but unsure why it wasn't filled. Called pharmacy who is now processing the script with no issues. Relayed information to patient.   Abelina Abide, PharmD PGY1 Pharmacy Resident 11/06/2023 2:30 PM

## 2023-11-14 ENCOUNTER — Emergency Department (HOSPITAL_COMMUNITY)

## 2023-11-14 ENCOUNTER — Emergency Department (HOSPITAL_COMMUNITY)
Admission: EM | Admit: 2023-11-14 | Discharge: 2023-11-15 | Disposition: A | Discharge status: Home / Self Care | Attending: Emergency Medicine | Admitting: Emergency Medicine

## 2023-11-14 ENCOUNTER — Encounter (HOSPITAL_COMMUNITY): Payer: Self-pay | Admitting: Emergency Medicine

## 2023-11-14 ENCOUNTER — Other Ambulatory Visit: Payer: Self-pay

## 2023-11-14 DIAGNOSIS — W19XXXA Unspecified fall, initial encounter: Secondary | ICD-10-CM

## 2023-11-14 DIAGNOSIS — Z043 Encounter for examination and observation following other accident: Secondary | ICD-10-CM | POA: Insufficient documentation

## 2023-11-14 DIAGNOSIS — G3183 Dementia with Lewy bodies: Secondary | ICD-10-CM | POA: Diagnosis not present

## 2023-11-14 DIAGNOSIS — R231 Pallor: Secondary | ICD-10-CM | POA: Diagnosis not present

## 2023-11-14 DIAGNOSIS — R410 Disorientation, unspecified: Secondary | ICD-10-CM | POA: Diagnosis not present

## 2023-11-14 DIAGNOSIS — I959 Hypotension, unspecified: Secondary | ICD-10-CM | POA: Diagnosis not present

## 2023-11-14 DIAGNOSIS — F0390 Unspecified dementia without behavioral disturbance: Secondary | ICD-10-CM | POA: Diagnosis not present

## 2023-11-14 DIAGNOSIS — I517 Cardiomegaly: Secondary | ICD-10-CM | POA: Diagnosis not present

## 2023-11-14 DIAGNOSIS — F02B0 Dementia in other diseases classified elsewhere, moderate, without behavioral disturbance, psychotic disturbance, mood disturbance, and anxiety: Secondary | ICD-10-CM | POA: Insufficient documentation

## 2023-11-14 DIAGNOSIS — R4182 Altered mental status, unspecified: Secondary | ICD-10-CM | POA: Insufficient documentation

## 2023-11-14 DIAGNOSIS — R0989 Other specified symptoms and signs involving the circulatory and respiratory systems: Secondary | ICD-10-CM | POA: Diagnosis not present

## 2023-11-14 DIAGNOSIS — I6782 Cerebral ischemia: Secondary | ICD-10-CM | POA: Diagnosis not present

## 2023-11-14 LAB — URINALYSIS, ROUTINE W REFLEX MICROSCOPIC
Bilirubin Urine: NEGATIVE
Glucose, UA: NEGATIVE mg/dL
Hgb urine dipstick: NEGATIVE
Ketones, ur: 80 mg/dL — AB
Nitrite: NEGATIVE
Protein, ur: 30 mg/dL — AB
Specific Gravity, Urine: 1.019 (ref 1.005–1.030)
pH: 5 (ref 5.0–8.0)

## 2023-11-14 LAB — COMPREHENSIVE METABOLIC PANEL WITH GFR
ALT: 10 U/L (ref 0–44)
AST: 21 U/L (ref 15–41)
Albumin: 3.4 g/dL — ABNORMAL LOW (ref 3.5–5.0)
Alkaline Phosphatase: 72 U/L (ref 38–126)
Anion gap: 9 (ref 5–15)
BUN: 10 mg/dL (ref 8–23)
CO2: 24 mmol/L (ref 22–32)
Calcium: 10.4 mg/dL — ABNORMAL HIGH (ref 8.9–10.3)
Chloride: 105 mmol/L (ref 98–111)
Creatinine, Ser: 1.32 mg/dL — ABNORMAL HIGH (ref 0.44–1.00)
GFR, Estimated: 44 mL/min — ABNORMAL LOW (ref >60)
Glucose, Bld: 79 mg/dL (ref 70–99)
Potassium: 4.2 mmol/L (ref 3.5–5.1)
Sodium: 138 mmol/L (ref 135–145)
Total Bilirubin: 1 mg/dL (ref 0.0–1.2)
Total Protein: 6.4 g/dL — ABNORMAL LOW (ref 6.5–8.1)

## 2023-11-14 LAB — I-STAT VENOUS BLOOD GAS, ED
Acid-Base Excess: 1 mmol/L (ref 0.0–2.0)
Bicarbonate: 27.4 mmol/L (ref 20.0–28.0)
Calcium, Ion: 1.33 mmol/L (ref 1.15–1.40)
HCT: 34 % — ABNORMAL LOW (ref 36.0–46.0)
Hemoglobin: 11.6 g/dL — ABNORMAL LOW (ref 12.0–15.0)
O2 Saturation: 61 %
Potassium: 3.7 mmol/L (ref 3.5–5.1)
Sodium: 141 mmol/L (ref 135–145)
TCO2: 29 mmol/L (ref 22–32)
pCO2, Ven: 49.4 mmHg (ref 44–60)
pH, Ven: 7.352 (ref 7.25–7.43)
pO2, Ven: 33 mmHg (ref 32–45)

## 2023-11-14 LAB — CBC WITH DIFFERENTIAL/PLATELET
Abs Immature Granulocytes: 0.04 10*3/uL (ref 0.00–0.07)
Basophils Absolute: 0 10*3/uL (ref 0.0–0.1)
Basophils Relative: 0 %
Eosinophils Absolute: 0 10*3/uL (ref 0.0–0.5)
Eosinophils Relative: 0 %
HCT: 39.7 % (ref 36.0–46.0)
Hemoglobin: 12.8 g/dL (ref 12.0–15.0)
Immature Granulocytes: 0 %
Lymphocytes Relative: 20 %
Lymphs Abs: 2 10*3/uL (ref 0.7–4.0)
MCH: 32.2 pg (ref 26.0–34.0)
MCHC: 32.2 g/dL (ref 30.0–36.0)
MCV: 100 fL (ref 80.0–100.0)
Monocytes Absolute: 0.8 10*3/uL (ref 0.1–1.0)
Monocytes Relative: 8 %
Neutro Abs: 7.1 10*3/uL (ref 1.7–7.7)
Neutrophils Relative %: 72 %
Platelets: 132 10*3/uL — ABNORMAL LOW (ref 150–400)
RBC: 3.97 MIL/uL (ref 3.87–5.11)
RDW: 14.8 % (ref 11.5–15.5)
WBC: 10 10*3/uL (ref 4.0–10.5)
nRBC: 0 % (ref 0.0–0.2)

## 2023-11-14 LAB — RAPID URINE DRUG SCREEN, HOSP PERFORMED
Amphetamines: NOT DETECTED
Barbiturates: NOT DETECTED
Benzodiazepines: NOT DETECTED
Cocaine: NOT DETECTED
Opiates: NOT DETECTED
Tetrahydrocannabinol: NOT DETECTED

## 2023-11-14 LAB — TROPONIN I (HIGH SENSITIVITY): Troponin I (High Sensitivity): 5 ng/L (ref <18)

## 2023-11-14 LAB — ETHANOL: Alcohol, Ethyl (B): <15 mg/dL (ref <15)

## 2023-11-14 LAB — VALPROIC ACID LEVEL: Valproic Acid Lvl: <10 ug/mL — ABNORMAL LOW (ref 50–100)

## 2023-11-14 LAB — MAGNESIUM: Magnesium: 2.2 mg/dL (ref 1.7–2.4)

## 2023-11-14 MED ORDER — LACTATED RINGERS IV BOLUS
500.0000 mL | Freq: Once | INTRAVENOUS | Status: AC
  Administered 2023-11-14: 500 mL via INTRAVENOUS

## 2023-11-14 NOTE — ED Notes (Signed)
 Sleepy, arousable to voice, interactive, able to hold conversation, mentions "feel like vertigo, denies pain, HA, nausea or sob. MAEx4.

## 2023-11-14 NOTE — ED Triage Notes (Signed)
 Per [facility] pt was found in someone's yard laying out front acting confused. Initially hypotensive 90/50 and given 1L of LR. Patient lives at home with grandson and was last seen by family around 8pm. Family states this has never happened however recently spoke with neurology about dementia.

## 2023-11-14 NOTE — ED Provider Notes (Signed)
 [place] EMERGENCY DEPARTMENT AT [facility] Provider Note   CSN: [id number] Arrival date & time: 11/14/23  1753     History  Chief Complaint  Patient presents with   Altered Mental Status    Janet Le is a 68 y.o. female.  HPI The patient's daughter is historian at this time.  She reports that patient does have Lewy body dementia but is normally interactive and has not been wandering away from home.  She reports normally she is fairly alert and right now she is extremely drowsy and lethargic.  Patient's daughter reports that she got a call from EMS that the patient had wandered about 1/2 mile from her home.  Apparently she was trying to go to the bus stop.  Her daughter reports however she left her purse at home and did not take any belongings with her.  The patient had fallen and a bystander observed her and called EMS.  Patient's daughter reports that she seemed to be at her baseline last night when she saw her.  She reports the last time she was drowsy like this and was not like herself, the patient had a UTI which was about a month ago.  However, no recent fevers that she knows of.  The patient is denying any focal pain but she does seem drowsy and mildly confused.  Patient's daughter reports that the patient takes Zoloft but does not take any sedating medications.  Does not taking pain medications.    Home Medications Prior to Admission medications   Medication Sig Start Date End Date Taking? Authorizing Provider  acetaminophen  (TYLENOL ) 500 MG tablet Take 1,000 mg by mouth every 6 (six) hours as needed for mild pain (pain score 1-3) or headache.    [provider]  atorvastatin  (LIPITOR) 10 MG tablet TAKE ONE TABLET BY MOUTH DAILY AT 5PM 03/25/23   Colene Dauphin, MD  citalopram  (CELEXA ) 10 MG tablet Take 10 mg by mouth every morning. 10/16/23   [provider]  dicyclomine  (BENTYL ) 10 MG capsule Take 1 capsule (10 mg total) by mouth 2 (two) times  daily as needed (intestinal spasms). 10/23/23   Colene Dauphin, MD  divalproex  (DEPAKOTE ) 125 MG DR tablet Take oe tab at night 10/28/23   Wertman, Sara E, PA-C  DULoxetine  (CYMBALTA ) 60 MG capsule Take 60 mg by mouth daily.    [provider]  fluticasone  (FLONASE ) 50 MCG/ACT nasal spray Place 2 sprays into both nostrils daily. 08/01/23   Colene Dauphin, MD  folic acid  (FOLVITE ) 1 MG tablet TAKE ONE TABLET BY MOUTH DAILY AT 9AM 04/05/23   Colene Dauphin, MD  furosemide  (LASIX ) 20 MG tablet TAKE ONE TABLET BY MOUTH DAILY AT 9AM 10/31/23   Colene Dauphin, MD  linaclotide  (LINZESS ) 72 MCG capsule Take 1 capsule (72 mcg total) by mouth daily before breakfast. For constipation Patient taking differently: Take 72 mcg by mouth daily as needed. For constipation 07/29/23   Colene Dauphin, MD  OZEMPIC , 1 MG/DOSE, 4 MG/3ML SOPN INJECT 1 MG SUBCUTANEOUSLY ONCE A WEEK Patient not taking: Reported on 10/28/2023 10/28/23   Colene Dauphin, MD  propranolol  (INDERAL ) 40 MG tablet Take 1 tablet (40 mg total) by mouth 2 (two) times daily. 09/20/23   Colene Dauphin, MD  rivastigmine  (EXELON ) 6 MG capsule Take 1 capsule (6 mg total) by mouth 2 (two) times daily. 08/08/23   Festus Hubert, Adam R, DO  Suvorexant  (BELSOMRA ) 15 MG TABS Take  1 tablet (15 mg total) by mouth at bedtime as needed. 08/01/23   Colene Dauphin, MD  telmisartan  (MICARDIS ) 80 MG tablet TAKE ONE TABLET BY MOUTH DAILY AT 9AM 10/02/23   Colene Dauphin, MD      Allergies    Sertraline hcl, Ace inhibitors, Codeine, Darifenacin hydrobromide er, Gabapentin , Pravastatin , Propoxyphene hcl, Seroquel  [quetiapine ], and Wellbutrin [bupropion hcl]    Review of Systems   Review of Systems  Physical Exam Updated Vital Signs BP (!) 152/78   Pulse 65   Temp 98 F (36.7 C) (Oral)   Resp 19   Ht 5\' 2"  (1.575 m)   Wt 88.9 kg   SpO2 100%   BMI 35.85 kg/m  Physical Exam Constitutional:      Comments: Patient resting quietly.  She is drowsy but awakens to light stimulus.   No respiratory distress.  Well-nourished well-developed.  Well-groomed and maintained.  HENT:     Head: Normocephalic and atraumatic.     Mouth/Throat:     Mouth: Mucous membranes are moist.     Pharynx: Oropharynx is clear.  Eyes:     Extraocular Movements: Extraocular movements intact.     Conjunctiva/sclera: Conjunctivae normal.     Pupils: Pupils are equal, round, and reactive to light.  Cardiovascular:     Rate and Rhythm: Normal rate and regular rhythm.  Pulmonary:     Effort: Pulmonary effort is normal.     Breath sounds: Normal breath sounds.  Abdominal:     General: There is no distension.     Palpations: Abdomen is soft.     Tenderness: There is no abdominal tenderness. There is no guarding.  Musculoskeletal:        General: No deformity. Normal range of motion.     Cervical back: Neck supple.     Right lower leg: No edema.     Left lower leg: No edema.  Skin:    General: Skin is warm and dry.  Neurological:     Comments: Patient is mildly drowsy.  She awakens and will answer a few questions but also dressed back to sleep.  Poor historian.  She does not recall what happened earlier today.  She is aware that she is at the hospital.  No focal neurologic deficit.  She will follow commands to do grip strength bilaterally.  She will elevate each lower extremity off of the bed.  However she does fall back asleep relatively quickly and executes commands with distraction  Psychiatric:     Comments: Calm.  No agitation.     ED Results / Procedures / Treatments   Labs (all labs ordered are listed, but only abnormal results are displayed) Labs Reviewed  COMPREHENSIVE METABOLIC PANEL WITH GFR - Abnormal; Notable for the following components:      Result Value   Creatinine, Ser 1.32 (*)    Calcium  10.4 (*)    Total Protein 6.4 (*)    Albumin 3.4 (*)    GFR, Estimated 44 (*)    All other components within normal limits  CBC WITH DIFFERENTIAL/PLATELET - Abnormal; Notable for  the following components:   Platelets 132 (*)    All other components within normal limits  URINALYSIS, ROUTINE W REFLEX MICROSCOPIC - Abnormal; Notable for the following components:   APPearance CLOUDY (*)    Ketones, ur 80 (*)    Protein, ur 30 (*)    Leukocytes,Ua SMALL (*)    Bacteria, UA MANY (*)  All other components within normal limits  VALPROIC ACID LEVEL - Abnormal; Notable for the following components:   Valproic Acid Lvl <10 (*)    All other components within normal limits  I-STAT VENOUS BLOOD GAS, ED - Abnormal; Notable for the following components:   HCT 34.0 (*)    Hemoglobin 11.6 (*)    All other components within normal limits  URINE CULTURE  ETHANOL  MAGNESIUM   AMMONIA  RAPID URINE DRUG SCREEN, HOSP PERFORMED  TROPONIN I (HIGH SENSITIVITY)  TROPONIN I (HIGH SENSITIVITY)    EKG EKG Interpretation Date/Time:  Thursday Nov 14 2023 18:08:34 EDT Ventricular Rate:  67 PR Interval:  181 QRS Duration:  92 QT Interval:  388 QTC Calculation: 410 R Axis:   41  Text Interpretation: Sinus rhythm Consider right atrial enlargement Consider left ventricular hypertrophy Borderline T abnormalities, inferior leads no sig change from previous Confirmed by Wynetta Heckle [phone]) on 11/14/2023 9:45:02 PM  Radiology CT Head Wo Contrast Result Date: 11/14/2023 CLINICAL DATA:  Confusion EXAM: CT HEAD WITHOUT CONTRAST TECHNIQUE: Contiguous axial images were obtained from the base of the skull through the vertex without intravenous contrast. RADIATION DOSE REDUCTION: This exam was performed according to the departmental dose-optimization program which includes automated exposure control, adjustment of the mA and/or kV according to patient size and/or use of iterative reconstruction technique. COMPARISON:  CT 10/18/2023 FINDINGS: Brain: No acute territorial infarction, hemorrhage or intracranial mass. Patchy white matter hypodensity likely mild chronic small vessel ischemic change.  Stable ventricle size Vascular: No hyperdense vessels.  No unexpected calcification Skull: Normal. Negative for fracture or focal lesion. Sinuses/Orbits: Mucosal thickening in the sinuses Other: None IMPRESSION: 1. No CT evidence for acute intracranial abnormality. 2. Mild chronic small vessel ischemic changes of the white matter. Electronically Signed   By: Esmeralda Hedge M.D.   On: 11/14/2023 21:08   DG Chest Port 1 View Result Date: 11/14/2023 CLINICAL DATA:  Altered mental status EXAM: PORTABLE CHEST 1 VIEW COMPARISON:  Chest x-ray 09/13/2023 FINDINGS: The heart is enlarged. Lung volumes are low likely accentuating pulmonary vascularity. No focal lung infiltrate, pleural effusion or pneumothorax. No acute fractures are seen. IMPRESSION: 1. Cardiomegaly. 2. Low lung volumes. Electronically Signed   By: Tyron Gallon M.D.   On: 11/14/2023 20:36    Procedures Procedures    Medications Ordered in ED Medications  lactated ringers  bolus 500 mL (500 mLs Intravenous New Bag/Given 11/14/23 2049)    ED Course/ Medical Decision Making/ A&P                                 Medical Decision Making Amount and/or Complexity of Data Reviewed Labs: ordered. Radiology: ordered.   Patient presents as outlined.  She had a change in baseline behavior leaving her home and wandering.  Patient does have a diagnosis of Lewy body dementia but her daughter reports that this is not typical for her and that the patient orients fairly well and is maintaining at home with family assistance.  No medication changes.  Patient's daughter denies that she has seemed to be ill recently.  She reports yesterday evening she seemed well and at baseline.  At this time we will proceed with broad diagnostic evaluation.  Reportedly patient's blood pressure by EMS was 90/50.  She was given 1 L of LR with improvement.  Patient now is normotensive with pressure 143/77.  Will proceed with CT head and lab  work.  No respiratory distress.   Oxygen  saturation 100%.  Heart is regular.   Urinalysis 6-10 WBCs 11-20 squamous epithelial cells.  Valproic acid less than 10.  Ethanol less than 15.  GFR 44.  White count normal.  At this time, diagnostic workup is unremarkable.  Patient has now been alert and interactive at baseline.  Patient's daughter had not noted any problems leading up to this.  At this time I suspect this is manifestation of the patient's dementia with wandering and then a fall.  Currently stable for discharge.  I have encouraged close follow-up and careful return precautions reviewed.        Final Clinical Impression(s) / ED Diagnoses Final diagnoses:  Altered mental status, unspecified altered mental status type  Fall, initial encounter  Moderate Lewy body dementia without behavioral disturbance, psychotic disturbance, mood disturbance, or anxiety (HCC)    Rx / DC Orders ED Discharge Orders     None         Wynetta Heckle, MD 11/14/23 2344

## 2023-11-14 NOTE — ED Notes (Signed)
   Pt transported to ct

## 2023-11-14 NOTE — Discharge Instructions (Addendum)
 1.  Continue observe for any signs of infection or other behavioral change not typical.  Watch for fever, cough or vomiting. 2.  Schedule a follow-up appointment with your doctor for recheck within the next 3 to 5 days. 3.  Return to the emergency department for recheck if any concerning changes. 4.  Urine culture was done.  At this time the urine suggests contamination and not infection.  You will be called if antibiotics are needed.

## 2023-11-15 ENCOUNTER — Telehealth: Payer: Self-pay

## 2023-11-15 LAB — TROPONIN I (HIGH SENSITIVITY): Troponin I (High Sensitivity): 3 ng/L (ref <18)

## 2023-11-15 NOTE — Telephone Encounter (Signed)
 Patient declined Nurse visit today. Patient seen in ED 11/14/23 for a fall.   Per Nurse , Patient daughter reported Oakley Bellman is doing okay today

## 2023-11-16 LAB — URINE CULTURE

## 2023-11-20 ENCOUNTER — Telehealth: Payer: Self-pay

## 2023-11-20 NOTE — Telephone Encounter (Signed)
 This patient is appearing on a report for being at risk of failing the adherence measure for diabetes medications this calendar year.   Medication: Ozempic  1 mg Last fill date: 11/06/23 for 28 day supply  Insurance report was not up to date. No action needed at this time.   Abelina Abide, PharmD PGY1 Pharmacy Resident 11/20/2023 4:02 PM

## 2023-12-04 ENCOUNTER — Other Ambulatory Visit: Payer: Self-pay | Admitting: Internal Medicine

## 2023-12-04 DIAGNOSIS — E119 Type 2 diabetes mellitus without complications: Secondary | ICD-10-CM

## 2023-12-05 ENCOUNTER — Ambulatory Visit: Payer: Self-pay

## 2023-12-05 DIAGNOSIS — J300 Vasomotor rhinitis: Secondary | ICD-10-CM | POA: Diagnosis not present

## 2023-12-05 DIAGNOSIS — R1310 Dysphagia, unspecified: Secondary | ICD-10-CM | POA: Diagnosis not present

## 2023-12-05 DIAGNOSIS — R0982 Postnasal drip: Secondary | ICD-10-CM | POA: Diagnosis not present

## 2023-12-05 NOTE — Telephone Encounter (Signed)
 FYI Only or Action Required?: FYI only for provider  Patient was last seen in primary care on 10/25/2023 by Colene Dauphin, MD. Called Nurse Triage reporting Urinary Tract Infection. Symptoms began several days ago. Interventions attempted: OTC medications: Azo. Symptoms are: gradually worsening.  Triage Disposition: See Physician Within 24 Hours  Patient/caregiver understands and will follow disposition?: Yes            Copied from CRM [id number]. Topic: Clinical - Medical Advice >> Dec 05, 2023 11:13 AM Danae Duncans wrote: Reason for CRM: pt daughter advise pt has UTI , pt want to know if pt could be seen or medications can be sent to pharmacy - [phone] [facility] Reason for Disposition  Urinating more frequently than usual (i.e., frequency)  Answer Assessment - Initial Assessment Questions 1. SYMPTOM: What's the main symptom you're concerned about? (e.g., frequency, incontinence)     Frequency  2. ONSET: When did the frequency start?     Monday  3. PAIN: Is there any pain? If Yes, ask: How bad is it? (Scale: 1-10; mild, moderate, severe)     It's not pain but it's irritation 4. CAUSE: What do you think is causing the symptoms?     UTI 5. OTHER SYMPTOMS: Do you have any other symptoms? (e.g., blood in urine, fever, flank pain, pain with urination)  Daughter Lovett Ruck states pt tested positive for UTI on a home test  Protocols used: Urinary Symptoms-A-AH

## 2023-12-06 ENCOUNTER — Ambulatory Visit (INDEPENDENT_AMBULATORY_CARE_PROVIDER_SITE_OTHER): Admitting: Family Medicine

## 2023-12-06 ENCOUNTER — Encounter: Payer: Self-pay | Admitting: Family Medicine

## 2023-12-06 VITALS — BP 110/72 | HR 69 | Temp 98.0°F | Ht 62.0 in | Wt 190.8 lb

## 2023-12-06 DIAGNOSIS — I890 Lymphedema, not elsewhere classified: Secondary | ICD-10-CM

## 2023-12-06 DIAGNOSIS — R300 Dysuria: Secondary | ICD-10-CM | POA: Diagnosis not present

## 2023-12-06 LAB — POCT URINALYSIS DIP (CLINITEK)
Bilirubin, UA: NEGATIVE
Blood, UA: NEGATIVE
Glucose, UA: NEGATIVE mg/dL
Ketones, POC UA: NEGATIVE mg/dL
Nitrite, UA: NEGATIVE
Spec Grav, UA: >=1.03 — AB (ref 1.010–1.025)
Urobilinogen, UA: 0.2 U/dL
pH, UA: 6 (ref 5.0–8.0)

## 2023-12-06 MED ORDER — CEPHALEXIN 500 MG PO CAPS: 500.0000 mg | ORAL_CAPSULE | Freq: Two times a day (BID) | ORAL | 0 refills | Status: AC

## 2023-12-06 NOTE — Patient Instructions (Addendum)
 Your urine was concerning for infection in the office today. We have sent it to the lab for culture and confirmation.  I have sent in keflex for you to take twice a day for 5 days. Please eat when you take this medication, it can upset your stomach if you do not. Please be sure to complete the course of antibiotics even if you are feeling better.   Follow-up with me for new or worsening symptoms.  May use compression socks (8-74mmHg), plus size, large calf   Referral to lymphedema clinic

## 2023-12-06 NOTE — Progress Notes (Signed)
 Acute Office Visit  Subjective:     Patient ID: Janet Le, female    DOB: [birth year], 68 y.o.   MRN: [id number]  Chief Complaint  Patient presents with   Acute Visit    Ongoing since Monday, urinary frequency, little burning with urination.    HPI: Patient is in today for evaluation of possible UTI.  She is accompanied by her granddaughter who is acting as historian. Has been having dysuria, urinary urgency and frequency for the last couple of days.  Has tried [place] UTI treatment with d-mannose, potassium and vitamin C.  Has increased fluids.   Denies gross hematuria, flank pain, nausea, vomiting, diarrhea, rash, fever, chills.  Requesting referral to lymphedema clinic today. Inquiring about compression socks and how to go about getting them for the patient as well.  ROS Per HPI      Objective:    BP 110/72 (BP Location: Left Arm, Patient Position: Sitting)   Pulse 69   Temp 98 F (36.7 C) (Temporal)   Ht 5' 2 (1.575 m)   Wt 190 lb 12.8 oz (86.5 kg)   SpO2 97%   BMI 34.90 kg/m    Physical Exam Vitals and nursing note reviewed.  Constitutional:      General: She is not in acute distress.    Comments: Appears fatigued  HENT:     Head: Normocephalic and atraumatic.   Eyes:     Extraocular Movements: Extraocular movements intact.   Pulmonary:     Effort: Pulmonary effort is normal.  Abdominal:     General: There is no distension.     Palpations: Abdomen is soft. There is no mass.     Tenderness: There is no abdominal tenderness. There is no right CVA tenderness or left CVA tenderness.   Musculoskeletal:     Cervical back: Normal range of motion.     Right lower leg: Edema (2+ non pitting) present.     Left lower leg: Edema (3+ non pitting) present.     Comments: Using single point cane   Skin:    General: Skin is warm and dry.   Neurological:     General: No focal deficit present.     Mental Status: She is alert. Mental status is at baseline.    Psychiatric:        Mood and Affect: Mood normal.        Behavior: Behavior normal.    Results for orders placed or performed in visit on 12/06/23  POCT URINALYSIS DIP (CLINITEK)  Result Value Ref Range   Color, UA yellow yellow   Clarity, UA cloudy (A) clear   Glucose, UA negative negative mg/dL   Bilirubin, UA negative negative   Ketones, POC UA negative negative mg/dL   Spec Grav, UA >=7.829 (A) 1.010 - 1.025   Blood, UA negative negative   pH, UA 6.0 5.0 - 8.0   POC PROTEIN,UA trace negative, trace   Urobilinogen, UA 0.2 0.2 or 1.0 E.U./dL   Nitrite, UA Negative Negative   Leukocytes, UA Small (1+) (A) Negative        Assessment & Plan:   Dysuria -     POCT URINALYSIS DIP (CLINITEK) -     Urine Culture -     Cephalexin; Take 1 capsule (500 mg total) by mouth 2 (two) times daily for 5 days.  Dispense: 10 capsule; Refill: 0  Lymphedema -     Ambulatory referral to Physical Therapy  Meds ordered this encounter  Medications   cephALEXin (KEFLEX) 500 MG capsule    Sig: Take 1 capsule (500 mg total) by mouth 2 (two) times daily for 5 days.    Dispense:  10 capsule    Refill:  0    Return if symptoms worsen or fail to improve, for as scheduled with PCP.  Wellington Half, FNP

## 2023-12-07 LAB — URINE CULTURE

## 2023-12-13 ENCOUNTER — Observation Stay (HOSPITAL_COMMUNITY)
Admission: EM | Admit: 2023-12-13 | Discharge: 2023-12-14 | Disposition: A | Discharge status: Home / Self Care | Attending: Internal Medicine | Admitting: Internal Medicine

## 2023-12-13 ENCOUNTER — Emergency Department (HOSPITAL_COMMUNITY)

## 2023-12-13 ENCOUNTER — Encounter (HOSPITAL_COMMUNITY): Payer: Self-pay

## 2023-12-13 ENCOUNTER — Other Ambulatory Visit: Payer: Self-pay

## 2023-12-13 DIAGNOSIS — R000 Tachycardia, unspecified: Secondary | ICD-10-CM | POA: Diagnosis not present

## 2023-12-13 DIAGNOSIS — F320 Major depressive disorder, single episode, mild: Secondary | ICD-10-CM

## 2023-12-13 DIAGNOSIS — W19XXXA Unspecified fall, initial encounter: Secondary | ICD-10-CM | POA: Diagnosis not present

## 2023-12-13 DIAGNOSIS — E119 Type 2 diabetes mellitus without complications: Secondary | ICD-10-CM | POA: Diagnosis not present

## 2023-12-13 DIAGNOSIS — F411 Generalized anxiety disorder: Secondary | ICD-10-CM | POA: Diagnosis present

## 2023-12-13 DIAGNOSIS — R42 Dizziness and giddiness: Secondary | ICD-10-CM | POA: Diagnosis not present

## 2023-12-13 DIAGNOSIS — F0280 Dementia in other diseases classified elsewhere without behavioral disturbance: Secondary | ICD-10-CM | POA: Diagnosis present

## 2023-12-13 DIAGNOSIS — I959 Hypotension, unspecified: Secondary | ICD-10-CM | POA: Diagnosis not present

## 2023-12-13 DIAGNOSIS — M5412 Radiculopathy, cervical region: Secondary | ICD-10-CM | POA: Diagnosis not present

## 2023-12-13 DIAGNOSIS — R55 Syncope and collapse: Principal | ICD-10-CM | POA: Diagnosis present

## 2023-12-13 DIAGNOSIS — M5416 Radiculopathy, lumbar region: Secondary | ICD-10-CM | POA: Diagnosis not present

## 2023-12-13 DIAGNOSIS — I10 Essential (primary) hypertension: Secondary | ICD-10-CM | POA: Diagnosis present

## 2023-12-13 DIAGNOSIS — E785 Hyperlipidemia, unspecified: Secondary | ICD-10-CM | POA: Diagnosis present

## 2023-12-13 DIAGNOSIS — R001 Bradycardia, unspecified: Secondary | ICD-10-CM

## 2023-12-13 DIAGNOSIS — Y92009 Unspecified place in unspecified non-institutional (private) residence as the place of occurrence of the external cause: Secondary | ICD-10-CM

## 2023-12-13 DIAGNOSIS — Z79899 Other long term (current) drug therapy: Secondary | ICD-10-CM | POA: Insufficient documentation

## 2023-12-13 DIAGNOSIS — G4700 Insomnia, unspecified: Secondary | ICD-10-CM | POA: Diagnosis not present

## 2023-12-13 DIAGNOSIS — F329 Major depressive disorder, single episode, unspecified: Secondary | ICD-10-CM | POA: Diagnosis present

## 2023-12-13 DIAGNOSIS — E861 Hypovolemia: Secondary | ICD-10-CM

## 2023-12-13 DIAGNOSIS — I517 Cardiomegaly: Secondary | ICD-10-CM | POA: Diagnosis not present

## 2023-12-13 DIAGNOSIS — G3183 Dementia with Lewy bodies: Secondary | ICD-10-CM | POA: Insufficient documentation

## 2023-12-13 DIAGNOSIS — S0990XA Unspecified injury of head, initial encounter: Secondary | ICD-10-CM | POA: Diagnosis not present

## 2023-12-13 LAB — URINALYSIS, ROUTINE W REFLEX MICROSCOPIC
Bilirubin Urine: NEGATIVE
Glucose, UA: NEGATIVE mg/dL
Hgb urine dipstick: NEGATIVE
Ketones, ur: NEGATIVE mg/dL
Nitrite: NEGATIVE
Protein, ur: NEGATIVE mg/dL
Specific Gravity, Urine: 1.016 (ref 1.005–1.030)
pH: 5 (ref 5.0–8.0)

## 2023-12-13 LAB — CBG MONITORING, ED: Glucose-Capillary: 105 mg/dL — ABNORMAL HIGH (ref 70–99)

## 2023-12-13 LAB — CBC
HCT: 37.7 % (ref 36.0–46.0)
Hemoglobin: 12.3 g/dL (ref 12.0–15.0)
MCH: 32.7 pg (ref 26.0–34.0)
MCHC: 32.6 g/dL (ref 30.0–36.0)
MCV: 100.3 fL — ABNORMAL HIGH (ref 80.0–100.0)
Platelets: 152 10*3/uL (ref 150–400)
RBC: 3.76 MIL/uL — ABNORMAL LOW (ref 3.87–5.11)
RDW: 14.5 % (ref 11.5–15.5)
WBC: 10.9 10*3/uL — ABNORMAL HIGH (ref 4.0–10.5)
nRBC: 0 % (ref 0.0–0.2)

## 2023-12-13 LAB — COMPREHENSIVE METABOLIC PANEL WITH GFR
ALT: 11 U/L (ref 0–44)
AST: 28 U/L (ref 15–41)
Albumin: 2.9 g/dL — ABNORMAL LOW (ref 3.5–5.0)
Alkaline Phosphatase: 77 U/L (ref 38–126)
Anion gap: 5 (ref 5–15)
BUN: 8 mg/dL (ref 8–23)
CO2: 24 mmol/L (ref 22–32)
Calcium: 9.3 mg/dL (ref 8.9–10.3)
Chloride: 109 mmol/L (ref 98–111)
Creatinine, Ser: 0.83 mg/dL (ref 0.44–1.00)
GFR, Estimated: >60 mL/min (ref >60)
Glucose, Bld: 89 mg/dL (ref 70–99)
Potassium: 4.6 mmol/L (ref 3.5–5.1)
Sodium: 138 mmol/L (ref 135–145)
Total Bilirubin: 1.2 mg/dL (ref 0.0–1.2)
Total Protein: 5.6 g/dL — ABNORMAL LOW (ref 6.5–8.1)

## 2023-12-13 LAB — TROPONIN I (HIGH SENSITIVITY): Troponin I (High Sensitivity): <2 ng/L (ref <18)

## 2023-12-13 MED ORDER — ENOXAPARIN SODIUM 40 MG/0.4ML IJ SOSY
40.0000 mg | PREFILLED_SYRINGE | INTRAMUSCULAR | Status: DC
  Administered 2023-12-14: 40 mg via SUBCUTANEOUS
  Filled 2023-12-13: qty 0.4

## 2023-12-13 MED ORDER — CITALOPRAM HYDROBROMIDE 10 MG PO TABS: 10.0000 mg | ORAL_TABLET | Freq: Every morning | ORAL | Status: DC

## 2023-12-13 MED ORDER — ONDANSETRON HCL 4 MG PO TABS: 4.0000 mg | ORAL_TABLET | Freq: Four times a day (QID) | ORAL | Status: DC | PRN

## 2023-12-13 MED ORDER — LACTATED RINGERS IV SOLN: INTRAVENOUS | Status: DC

## 2023-12-13 MED ORDER — SODIUM CHLORIDE 0.9% FLUSH
3.0000 mL | Freq: Two times a day (BID) | INTRAVENOUS | Status: DC
  Administered 2023-12-13: 3 mL via INTRAVENOUS

## 2023-12-13 MED ORDER — LACTATED RINGERS IV BOLUS
1000.0000 mL | Freq: Once | INTRAVENOUS | Status: AC
  Administered 2023-12-13: 1000 mL via INTRAVENOUS

## 2023-12-13 MED ORDER — SODIUM CHLORIDE 0.9% FLUSH
3.0000 mL | Freq: Two times a day (BID) | INTRAVENOUS | Status: DC
  Administered 2023-12-13 – 2023-12-14 (×2): 3 mL via INTRAVENOUS

## 2023-12-13 MED ORDER — ONDANSETRON HCL 4 MG/2ML IJ SOLN: 4.0000 mg | Freq: Four times a day (QID) | INTRAMUSCULAR | Status: DC | PRN

## 2023-12-13 MED ORDER — DULOXETINE HCL 30 MG PO CPEP
60.0000 mg | ORAL_CAPSULE | Freq: Every day | ORAL | Status: DC
  Administered 2023-12-14: 60 mg via ORAL
  Filled 2023-12-13: qty 2

## 2023-12-13 MED ORDER — ACETAMINOPHEN 325 MG PO TABS: 650.0000 mg | ORAL_TABLET | Freq: Four times a day (QID) | ORAL | Status: DC | PRN

## 2023-12-13 MED ORDER — SODIUM CHLORIDE 0.9% FLUSH: 3.0000 mL | INTRAVENOUS | Status: DC | PRN

## 2023-12-13 MED ORDER — RIVASTIGMINE TARTRATE 1.5 MG PO CAPS
6.0000 mg | ORAL_CAPSULE | Freq: Two times a day (BID) | ORAL | Status: DC
  Administered 2023-12-13 – 2023-12-14 (×2): 6 mg via ORAL
  Filled 2023-12-13 (×3): qty 4

## 2023-12-13 MED ORDER — SODIUM CHLORIDE 0.9 % IV SOLN: 250.0000 mL | INTRAVENOUS | Status: DC | PRN

## 2023-12-13 MED ORDER — ACETAMINOPHEN 650 MG RE SUPP
650.0000 mg | Freq: Four times a day (QID) | RECTAL | Status: DC | PRN
Start: 2023-12-13 — End: 2023-12-14

## 2023-12-13 NOTE — ED Provider Notes (Signed)
 [place] EMERGENCY DEPARTMENT AT [facility] Provider Note   CSN: [id number] Arrival date & time: 12/13/23  1191     Patient presents with: Near Syncope   Janet Le is a 68 y.o. female.   HPI 68 year old female presents with a fall from standing.  Patient reports that her legs buckled and she fell to the ground.  Her daughter helped her up.  No injuries including no head injury, back injury or extremity injury.  Denies headache, chest pain, shortness of breath, syncope or dizziness.  She states this leg buckling is a recurrent issue for her.  Right now she feels fine.  She is currently finishing up antibiotics for a UTI. No unilateral weakness.  Daughter arrived after the initial workup and noted that the patient did fall and while the daughter did not witness this, the granddaughter did.  Seems like she probably syncopized while standing.  She did hit her head.  There was no obvious seizure-like activity.  Prior to Admission medications   Medication Sig Start Date End Date Taking? Authorizing Provider  acetaminophen  (TYLENOL ) 500 MG tablet Take 1,000 mg by mouth every 6 (six) hours as needed for mild pain (pain score 1-3) or headache.    [provider]  atorvastatin  (LIPITOR) 10 MG tablet TAKE ONE TABLET BY MOUTH DAILY AT 5PM 03/25/23   Colene Dauphin, MD  citalopram  (CELEXA ) 10 MG tablet Take 10 mg by mouth every morning. 10/16/23   [provider]  dicyclomine  (BENTYL ) 10 MG capsule Take 1 capsule (10 mg total) by mouth 2 (two) times daily as needed (intestinal spasms). 10/23/23   Colene Dauphin, MD  divalproex  (DEPAKOTE ) 125 MG DR tablet Take oe tab at night 10/28/23   Wertman, Sara E, PA-C  DULoxetine  (CYMBALTA ) 60 MG capsule Take 60 mg by mouth daily.    [provider]  fluticasone  (FLONASE ) 50 MCG/ACT nasal spray Place 2 sprays into both nostrils daily. 08/01/23   Colene Dauphin, MD  folic acid  (FOLVITE ) 1 MG tablet TAKE ONE TABLET BY MOUTH  DAILY AT 9AM 04/05/23   Colene Dauphin, MD  furosemide  (LASIX ) 20 MG tablet TAKE ONE TABLET BY MOUTH DAILY AT 9AM 10/31/23   Colene Dauphin, MD  linaclotide  (LINZESS ) 72 MCG capsule Take 1 capsule (72 mcg total) by mouth daily before breakfast. For constipation Patient taking differently: Take 72 mcg by mouth daily as needed. For constipation 07/29/23   Colene Dauphin, MD  OZEMPIC , 1 MG/DOSE, 4 MG/3ML SOPN INJECT 1MG   SUBCUTANEOUSLY ONCE A WEEK 12/04/23   Colene Dauphin, MD  propranolol  (INDERAL ) 40 MG tablet Take 1 tablet (40 mg total) by mouth 2 (two) times daily. 09/20/23   Colene Dauphin, MD  rivastigmine  (EXELON ) 6 MG capsule Take 1 capsule (6 mg total) by mouth 2 (two) times daily. 08/08/23   Merriam Abbey, DO  Suvorexant  (BELSOMRA ) 15 MG TABS Take 1 tablet (15 mg total) by mouth at bedtime as needed. 08/01/23   Colene Dauphin, MD  telmisartan  (MICARDIS ) 80 MG tablet TAKE ONE TABLET BY MOUTH DAILY AT 9AM 10/02/23   Colene Dauphin, MD    Allergies: Sertraline hcl, Ace inhibitors, Codeine, Darifenacin hydrobromide er, Gabapentin , Pravastatin , Propoxyphene hcl, Seroquel  [quetiapine ], and Wellbutrin [bupropion hcl]    Review of Systems  Constitutional:  Negative for fever.  Respiratory:  Negative for shortness of breath.   Cardiovascular:  Negative for chest pain.  Gastrointestinal:  Negative for abdominal  pain.  Neurological:  Positive for weakness. Negative for numbness (chronic neuropathy, no new numbness) and headaches.    Updated Vital Signs BP (!) 146/75   Pulse (!) 50   Temp 98.4 F (36.9 C) (Oral)   Resp 17   Ht 5' 2 (1.575 m)   Wt 86 kg   SpO2 100%   BMI 34.68 kg/m   Physical Exam Vitals and nursing note reviewed.  Constitutional:      Appearance: She is well-developed.  HENT:     Head: Normocephalic and atraumatic.   Eyes:     Extraocular Movements: Extraocular movements intact.     Pupils: Pupils are equal, round, and reactive to light.    Cardiovascular:     Rate and  Rhythm: Regular rhythm. Bradycardia present.     Heart sounds: Normal heart sounds.  Pulmonary:     Effort: Pulmonary effort is normal.     Breath sounds: Normal breath sounds.  Abdominal:     Palpations: Abdomen is soft.     Tenderness: There is no abdominal tenderness.   Skin:    General: Skin is warm and dry.   Neurological:     Mental Status: She is alert.     Comments: CN 3-12 grossly intact. 5/5 strength in all 4 extremities. Grossly normal sensation. Normal finger to nose.     (all labs ordered are listed, but only abnormal results are displayed) Labs Reviewed  COMPREHENSIVE METABOLIC PANEL WITH GFR - Abnormal; Notable for the following components:      Result Value   Total Protein 5.6 (*)    Albumin 2.9 (*)    All other components within normal limits  CBC - Abnormal; Notable for the following components:   WBC 10.9 (*)    RBC 3.76 (*)    MCV 100.3 (*)    All other components within normal limits  URINALYSIS, ROUTINE W REFLEX MICROSCOPIC - Abnormal; Notable for the following components:   APPearance CLOUDY (*)    Leukocytes,Ua SMALL (*)    Bacteria, UA FEW (*)    All other components within normal limits  CBG MONITORING, ED - Abnormal; Notable for the following components:   Glucose-Capillary 105 (*)    All other components within normal limits  TROPONIN I (HIGH SENSITIVITY)  TROPONIN I (HIGH SENSITIVITY)    EKG: EKG Interpretation Date/Time:  Friday December 13 2023 20:03:50 EDT Ventricular Rate:  49 PR Interval:  210 QRS Duration:  93 QT Interval:  413 QTC Calculation: 373 R Axis:   18  Text Interpretation: Sinus bradycardia Probable left atrial enlargement Confirmed by Jerilynn Montenegro [phone]) on 12/13/2023 8:10:57 PM  Radiology: CT Head Wo Contrast Result Date: 12/13/2023 CLINICAL DATA:  Head trauma, minor (Age >= 65y) EXAM: CT HEAD WITHOUT CONTRAST TECHNIQUE: Contiguous axial images were obtained from the base of the skull through the vertex without  intravenous contrast. RADIATION DOSE REDUCTION: This exam was performed according to the departmental dose-optimization program which includes automated exposure control, adjustment of the mA and/or kV according to patient size and/or use of iterative reconstruction technique. COMPARISON:  11/14/2023 FINDINGS: Brain: No evidence of acute infarction, hemorrhage, hydrocephalus, extra-axial collection or mass lesion/mass effect. Vascular: No hyperdense vessel or unexpected calcification. Skull: Normal. Negative for fracture or focal lesion. Sinuses/Orbits: No acute finding. Postsurgical changes noted within the visualized left paranasal sinuses Other: None. IMPRESSION: 1. No acute intracranial abnormality is identified. Electronically Signed   By: Worthy Heads M.D.   On: 12/13/2023 21:44  DG Chest Portable 1 View Result Date: 12/13/2023 CLINICAL DATA:  Syncope EXAM: PORTABLE CHEST 1 VIEW COMPARISON:  11/14/2023 FINDINGS: Mild cardiomegaly. No acute airspace disease, pleural effusion, or pneumothorax IMPRESSION: No active disease. Mild cardiomegaly. Electronically Signed   By: Esmeralda Hedge M.D.   On: 12/13/2023 20:57     Procedures   Medications Ordered in the ED  lactated ringers  bolus 1,000 mL (1,000 mLs Intravenous New Bag/Given 12/13/23 2023)                                    Medical Decision Making Amount and/or Complexity of Data Reviewed Labs: ordered.    Details: Normal troponin Radiology: ordered and independent interpretation performed.    Details: No head bleed ECG/medicine tests: ordered and independent interpretation performed.    Details: Sinus bradycardia  Risk Decision regarding hospitalization.   Patient presents with what sounds like syncope.  Could have been an orthostatic issue, however she has also been bradycardic, often into the 40s.  Does not seem to be symptomatic now but is unclear if she had a bradycardic event causing her syncope.  Does not sound like a  seizure.  However she did hit her head so a head CT was obtained.  This is negative. I discussed with Dr. Sundil, who will admit.     Final diagnoses:  Syncope and collapse    ED Discharge Orders     None          Jerilynn Montenegro, MD 12/13/23 2254

## 2023-12-13 NOTE — H&P (Addendum)
 History and Physical    Janet Le:[id number] DOB: September 26, 1955 DOA: 12/13/2023  PCP: Colene Dauphin, MD   Patient coming from: Home   Chief Complaint:  Chief Complaint  Patient presents with   Near Syncope   ED TRIAGE note:  BIB EMS from home. Dizziness upon standing had near syncopal episode and helped herself to ground. Denies LOC no injury.    EMS VS 500cc normal saline in 20 RAC   122/70 51 pulse 99% RA CBG 150      HPI:  Janet Le is a 68 y.o. female with medical history significant of Lewy body dementia with mood disturbance, essential hypertension, DM type II, hyperlipidemia, generalized anxiety disorder, insomnia, GERD, hyperlipidemia, chronic constipation, and morbid obesity presented to emergency department complaining of fall from a standing position.  Patient reported that her knee has been buckled and she fell on the ground.  Patient's daughter helped to get up.  Denies any head injury, back injury and extremity pain.Denies headache, chest pain, shortness of breath, syncope or dizziness.  She states leg buckling is a recurrent issue for her.  Right now she feels fine.  She is currently finishing up antibiotics for a UTI. No unilateral weakness.   Daughter at the bedside reported that patient has 2 episodes of fall yesterday.  Patient uses walker at baseline.  Patient denies any dizziness, headache, tingling, tremor, sensory change, speech change, focal weakness, generalized weakness, seizure and loss of consciousness.  Denies any chest pain, palpitation and lower extremities swelling.  ED Course:  At presentation to ED patient found bradycardic heart rate dropped to 50, hypotensive blood pressure 81/65 which has been improved now.  Otherwise hemodynamically stable.  CBC showed leukocytosis 10.9, elevated MCV and normal platelet and H&H.  CMP unremarkable.   blood glucose within normal range. Troponin T normal range. EKG showed sinus bradycardia  heart rate 49.  CT head no acute endocrine abnormality. X-ray no active disease process.  It showed cardiomegaly.   In the ED patient has been given 1 L of LR bolus.  Hospitalist has been consulted for further evaluation management of near syncope and fall in the setting of hypotension and   Significant labs in the ED: Lab Orders         Comprehensive metabolic panel         CBC         Urinalysis, Routine w reflex microscopic -Urine, Clean Catch         CBC         Comprehensive metabolic panel         CBG monitoring, ED       Review of Systems:  Review of Systems  Constitutional:  Negative for chills, fever, malaise/fatigue and weight loss.  Eyes:  Negative for blurred vision.  Respiratory:  Negative for cough, sputum production and shortness of breath.   Cardiovascular:  Negative for chest pain and palpitations.  Gastrointestinal:  Negative for abdominal pain, diarrhea, heartburn, nausea and vomiting.  Genitourinary:  Negative for dysuria, frequency, hematuria and urgency.  Musculoskeletal:  Positive for falls. Negative for back pain, joint pain, myalgias and neck pain.  Neurological:  Negative for dizziness, tingling, tremors, sensory change, speech change, focal weakness, seizures, loss of consciousness, weakness and headaches.  Psychiatric/Behavioral:  The patient does not have insomnia.     Past Medical History:  Diagnosis Date   Allergic rhinitis 08/31/2009   Arthralgia of hip 11/14/2017   Attention deficit hyperactivity  disorder 06/06/2020   diagnosed as adult w/o formal testing   Bilateral leg edema 06/12/2018   Blepharitis 11/07/2019   Blood dyscrasia    platelets low in past   Breast asymmetry 04/08/2021   Cataract    Cervical radiculopathy 09/07/2020   Cervicalgia 05/30/2016   Chronic low back pain 07/18/2016   Degeneration of lumbar intervertebral disc 01/09/2018   Dyspnea on exertion 05/11/2019   Endometriosis    Esophageal stricture    Essential  hypertension 05/06/2009   Excessive daytime sleepiness 05/11/2019   External hemorrhoids 08/28/2010   Fatigue 11/14/2017   Generalized anxiety disorder 10/08/2017   GERD (gastroesophageal reflux disease)    Glaucoma 06/06/2020   Gout 12/11/2013   Hiatal hernia    History of positive PPD 04/09/2022   History of latent TB-completed treatment     Hyperlipidemia 03/16/2010   Hypertensive retinopathy of both eyes 10/06/2019   IBS (irritable bowel syndrome) with chronic constipation 11/14/2010   Insomnia 12/21/2022   Iridocyclitis 10/06/2019   Laryngopharyngeal reflux (LPR) 07/03/2018   Lattice degeneration of both retinas 10/06/2019   Left lumbar radiculopathy 04/13/2012   Lewy body dementia 01/14/2023   Dr Festus Hubert     LVH (left ventricular hypertrophy), mild 05/18/2017   Echo 04/2017 - mild LVH, normal EF, Grade 1 DD   Lymphedema    Major depressive disorder 10/08/2017   Migraine 06/14/2009   Morbid obesity 11/06/2013   Neuropathy 10/07/2017   Nocturnal hypoxemia 05/11/2019   Osteopenia 11/18/2015   06/10/2018: LFN -1.1, left radius 0.7, low FRAX.  No significant change from 2017  dexa 10/2015: t score  Spine -1.3, dual femur -0.9  Took alendronate  2014-2019   Pain in left knee 09/08/2014   Post traumatic stress disorder (PTSD)    Primary osteoarthritis involving multiple joints 03/10/2019   Bilateral hips, knees   Pseudophakia of both eyes 10/06/2019   Pure hypercholesterolemia 03/16/2010   REM sleep behaviors    Renal cell carcinoma    R cryoablation by IR 02/16/11, Dr. Nereida Banning  Followed by Dr. Alita Apt  No evidence of residual disease on CT 12/13 and 12/14   Rib pain on left side 01/03/2021   S/P knee surgery 10/30/2016   Snoring 04/09/2022   Spondylolisthesis of lumbar region 01/09/2018   Syncope and collapse 05/16/2009   since childhood; associated with extreme heat   TB lung, latent 11/25/2019   Thrombocytopenia 05/30/2015   Saw hem 02/2017 - mild, chronic - advised to  stop protonix , no concerning cause, plan for pcp to monitor   Tinnitus of right ear 07/03/2018   Type 2 diabetes mellitus without complication, without long-term current use of insulin  10/06/2019   Urinary incontinence 07/03/2021   Venous insufficiency of both lower extremities 04/06/2021   Visual hallucinations    Vulvar itching 01/03/2021    Past Surgical History:  Procedure Laterality Date   BREAST CYST EXCISION Bilateral over 10 years ago   No visable scar    CATARACT EXTRACTION, BILATERAL Bilateral 2020   COLONOSCOPY     fallopian tube removed     FUNCTIONAL ENDOSCOPIC SINUS SURGERY     HEMORRHOID BANDING     LASER ABLATION OF THE CERVIX     NASAL SINUS SURGERY     RENAL CRYOABLATION  02/16/11   R kidney due to RCC (IR procedure)   TOTAL KNEE ARTHROPLASTY Right 10/30/2016   Procedure: RIGHT TOTAL KNEE ARTHROPLASTY;  Surgeon: Jasmine Mesi, MD;  Location: [facility] OR;  Service: Orthopedics;  Laterality:  Right;   TUBAL LIGATION     UMBILICAL HERNIA REPAIR       reports that she has never smoked. She has been exposed to tobacco smoke. She has never used smokeless tobacco. She reports that she does not drink alcohol and does not use drugs.  Allergies  Allergen Reactions   Sertraline Hcl Other (See Comments)    Spasms; numbness   Ace Inhibitors Cough   Codeine Palpitations   Darifenacin Hydrobromide Er Other (See Comments)    Pt states made her feel queezy & drunk   Gabapentin  Other (See Comments)    Hallucinations   Pravastatin  Other (See Comments)    myalgias   Propoxyphene Hcl Palpitations   Seroquel  [Quetiapine ] Other (See Comments)    Woke up screaming and yelling after taking it   Wellbutrin [Bupropion Hcl] Other (See Comments)    Numbness of mouth/lips    Family History  Problem Relation Age of Onset   Diabetes Mother    Hypertension Mother    Hyperlipidemia Mother    Heart disease Mother    Stroke Mother    Kidney disease Mother    Thyroid  disease Mother     Sleep apnea Mother    Obesity Mother    Memory loss Mother    Kidney disease Father    Prostate cancer Father    Alcoholism Father    Alcohol abuse Father    Multiple sclerosis Sister    Colitis Brother    Alcohol abuse Brother    Leukemia Brother    Alcohol abuse Daughter    Breast cancer Maternal Aunt    Breast cancer Cousin    Cancer Other    Asthma Other    Colon polyps Other    Colon cancer Neg Hx    Esophageal cancer Neg Hx    Pancreatic cancer Neg Hx    Rectal cancer Neg Hx    Stomach cancer Neg Hx     Prior to Admission medications   Medication Sig Start Date End Date Taking? Authorizing Provider  acetaminophen  (TYLENOL ) 500 MG tablet Take 1,000 mg by mouth every 6 (six) hours as needed for mild pain (pain score 1-3) or headache.    [provider]  atorvastatin  (LIPITOR) 10 MG tablet TAKE ONE TABLET BY MOUTH DAILY AT 5PM 03/25/23   Colene Dauphin, MD  citalopram  (CELEXA ) 10 MG tablet Take 10 mg by mouth every morning. 10/16/23   [provider]  dicyclomine  (BENTYL ) 10 MG capsule Take 1 capsule (10 mg total) by mouth 2 (two) times daily as needed (intestinal spasms). 10/23/23   Colene Dauphin, MD  divalproex  (DEPAKOTE ) 125 MG DR tablet Take oe tab at night 10/28/23   Wertman, Sara E, PA-C  DULoxetine  (CYMBALTA ) 60 MG capsule Take 60 mg by mouth daily.    [provider]  fluticasone  (FLONASE ) 50 MCG/ACT nasal spray Place 2 sprays into both nostrils daily. 08/01/23   Colene Dauphin, MD  folic acid  (FOLVITE ) 1 MG tablet TAKE ONE TABLET BY MOUTH DAILY AT 9AM 04/05/23   Colene Dauphin, MD  furosemide  (LASIX ) 20 MG tablet TAKE ONE TABLET BY MOUTH DAILY AT 9AM 10/31/23   Colene Dauphin, MD  linaclotide  (LINZESS ) 72 MCG capsule Take 1 capsule (72 mcg total) by mouth daily before breakfast. For constipation Patient taking differently: Take 72 mcg by mouth daily as needed. For constipation 07/29/23   Colene Dauphin, MD  OZEMPIC , 1 MG/DOSE, 4 MG/3ML [facility] INJECT  1MG   SUBCUTANEOUSLY ONCE A WEEK 12/04/23   Colene Dauphin, MD  propranolol  (INDERAL ) 40 MG tablet Take 1 tablet (40 mg total) by mouth 2 (two) times daily. 09/20/23   Colene Dauphin, MD  rivastigmine  (EXELON ) 6 MG capsule Take 1 capsule (6 mg total) by mouth 2 (two) times daily. 08/08/23   Merriam Abbey, DO  Suvorexant  (BELSOMRA ) 15 MG TABS Take 1 tablet (15 mg total) by mouth at bedtime as needed. 08/01/23   Colene Dauphin, MD  telmisartan  (MICARDIS ) 80 MG tablet TAKE ONE TABLET BY MOUTH DAILY AT 9AM 10/02/23   Colene Dauphin, MD     Physical Exam: Vitals:   12/13/23 2029 12/13/23 2032 12/13/23 2200 12/13/23 2300  BP: (!) 81/65 (!) 146/75 139/81   Pulse: (!) 59 (!) 50 66   Resp: 18 17 16    Temp:    98.1 F (36.7 C)  TempSrc:    Oral  SpO2: 100% 100% 100%   Weight:      Height:        Physical Exam Vitals and nursing note reviewed.  Constitutional:      Appearance: She is obese.  HENT:     Mouth/Throat:     Mouth: Mucous membranes are moist.   Cardiovascular:     Rate and Rhythm: Regular rhythm. Bradycardia present.     Pulses: Normal pulses.     Heart sounds: Normal heart sounds.  Pulmonary:     Effort: Pulmonary effort is normal.  Abdominal:     Palpations: Abdomen is soft.   Musculoskeletal:     Cervical back: Neck supple.     Right lower leg: No edema.     Left lower leg: No edema.   Skin:    Capillary Refill: Capillary refill takes less than 2 seconds.   Neurological:     Mental Status: She is alert and oriented to person, place, and time.   Psychiatric:        Mood and Affect: Mood normal.      Labs on Admission: I have personally reviewed following labs and imaging studies  CBC: Recent Labs  Lab 12/13/23 1913  WBC 10.9*  HGB 12.3  HCT 37.7  MCV 100.3*  PLT 152   Basic Metabolic Panel: Recent Labs  Lab 12/13/23 1913  NA 138  K 4.6  CL 109  CO2 24  GLUCOSE 89  BUN 8  CREATININE 0.83  CALCIUM  9.3   GFR: Estimated Creatinine Clearance: 66.1  mL/min (by C-G formula based on SCr of 0.83 mg/dL). Liver Function Tests: Recent Labs  Lab 12/13/23 1913  AST 28  ALT 11  ALKPHOS 77  BILITOT 1.2  PROT 5.6*  ALBUMIN 2.9*   No results for input(s): LIPASE, AMYLASE in the last 168 hours. No results for input(s): AMMONIA in the last 168 hours. Coagulation Profile: No results for input(s): INR, PROTIME in the last 168 hours. Cardiac Enzymes: Recent Labs  Lab 12/13/23 1913  TROPONINIHS <2   BNP (last 3 results) No results for input(s): BNP in the last 8760 hours. HbA1C: No results for input(s): HGBA1C in the last 72 hours. CBG: Recent Labs  Lab 12/13/23 1952  GLUCAP 105*   Lipid Profile: No results for input(s): CHOL, HDL, LDLCALC, TRIG, CHOLHDL, LDLDIRECT in the last 72 hours. Thyroid  Function Tests: No results for input(s): TSH, T4TOTAL, FREET4, T3FREE, THYROIDAB in the last 72 hours. Anemia Panel: No results for input(s): VITAMINB12, FOLATE, FERRITIN, TIBC, IRON, RETICCTPCT in  the last 72 hours. Urine analysis:    Component Value Date/Time   COLORURINE YELLOW 12/13/2023 2030   APPEARANCEUR CLOUDY (A) 12/13/2023 2030   LABSPEC 1.016 12/13/2023 2030   PHURINE 5.0 12/13/2023 2030   GLUCOSEU NEGATIVE 12/13/2023 2030   GLUCOSEU NEGATIVE 09/20/2023 1135   HGBUR NEGATIVE 12/13/2023 2030   BILIRUBINUR NEGATIVE 12/13/2023 2030   BILIRUBINUR negative 12/06/2023 1041   BILIRUBINUR neg 11/05/2013 0929   KETONESUR NEGATIVE 12/13/2023 2030   PROTEINUR NEGATIVE 12/13/2023 2030   UROBILINOGEN 0.2 12/06/2023 1041   UROBILINOGEN 0.2 09/20/2023 1135   NITRITE NEGATIVE 12/13/2023 2030   LEUKOCYTESUR SMALL (A) 12/13/2023 2030    Radiological Exams on Admission: I have personally reviewed images CT Head Wo Contrast Result Date: 12/13/2023 CLINICAL DATA:  Head trauma, minor (Age >= 65y) EXAM: CT HEAD WITHOUT CONTRAST TECHNIQUE: Contiguous axial images were obtained from the base of  the skull through the vertex without intravenous contrast. RADIATION DOSE REDUCTION: This exam was performed according to the departmental dose-optimization program which includes automated exposure control, adjustment of the mA and/or kV according to patient size and/or use of iterative reconstruction technique. COMPARISON:  11/14/2023 FINDINGS: Brain: No evidence of acute infarction, hemorrhage, hydrocephalus, extra-axial collection or mass lesion/mass effect. Vascular: No hyperdense vessel or unexpected calcification. Skull: Normal. Negative for fracture or focal lesion. Sinuses/Orbits: No acute finding. Postsurgical changes noted within the visualized left paranasal sinuses Other: None. IMPRESSION: 1. No acute intracranial abnormality is identified. Electronically Signed   By: Worthy Heads M.D.   On: 12/13/2023 21:44   DG Chest Portable 1 View Result Date: 12/13/2023 CLINICAL DATA:  Syncope EXAM: PORTABLE CHEST 1 VIEW COMPARISON:  11/14/2023 FINDINGS: Mild cardiomegaly. No acute airspace disease, pleural effusion, or pneumothorax IMPRESSION: No active disease. Mild cardiomegaly. Electronically Signed   By: Esmeralda Hedge M.D.   On: 12/13/2023 20:57     EKG: My personal interpretation of EKG shows: Sinus bradycardia heart rate 49.  Normal QTc interval and there is no ST and T of abnormality.    Assessment/Plan: Principal Problem:   Fall at home, initial encounter Active Problems:   Near syncope   Sinus bradycardia   Hypotension   HLD (hyperlipidemia)   Essential hypertension   Left lumbar radiculopathy   Major depressive disorder   Cervical radiculopathy   GAD (generalized anxiety disorder)   Non-insulin  dependent type 2 diabetes mellitus (HCC)   Lewy body dementia   Insomnia    Assessment and Plan: Near syncope -Presented emergency department complaining of near syncopal episode due to bilateral knees has been giving away.  Patient did not lost consciousness however she fell on  the ground.  Denies any hitting of the head.  Denies any injury. -Presentation to ED patient found hypotensive and bradycardic.  Blood pressure was 81/65 and heart rate was 49 on EKG. -CBC and CMP unremarkable.  Troponin within normal range.  CT head no acute endocrine abnormality.  Chest x-ray showed cardiomegaly no acute disease process. - Concern for near syncope in the setting of  hypotension and bradycardia. -In the setting of hypotension holding blood pressure regimen.  In the ED patient has been given 1 L of LR bolus.  Blood pressure has been improved.  Continue maintenance fluid LR 125 cc/h. -Bradycardia likely secondary from in the setting of propranolol  use.  Hold propranolol  to allow for washout and monitor improvement of heart rate.  Continue cardiac monitoring. -Obtaining echocardiogram. - During discharge patient will need an Holter monitor for 2  weeks to rule out any underlying arrhythmia.   Mechanical fall at home Mechanical fall at home due to near syncope. - Consulting inpatient PT and OT for evaluation of balance. -Continue fall precaution and ambulate with assistance.  Sinus bradycardia -Sinus bradycardia in the setting of propranolol  use.  Normal electrolytes.  Holding propranolol .  Continue cardiac monitoring. -Obtaining echocardiogram.   Hypotension Essential hypertension -In the setting of hypotension holding Lasix , Micardis  and propranolol . Resuscitating with IV fluid.  Lewy body dementia with mood disturbance Major depressive disorder Generalized anxiety disorder -Continue Cymbalta  and rivastigmine .  Hyperlipidemia -Waiting for pharmacy verification the patient takes Lipitor or not.   Non-insulin -dependent DM type II - Diet controlled.  DVT prophylaxis:  Lovenox  SCD and TED hose Code Status:  Full Code Diet: Heart healthy diet Family Communication:   Family was present at bedside, at the time of interview. Opportunity was given to ask question and all  questions were answered satisfactorily.  Disposition Plan: Pending echocardiogram.  Continue to monitor development of bradycardia.  Pending PT and OT evaluation of balance. Consults: Physical therapy and Occupational Therapy Admission status:   Observation, Telemetry bed  Severity of Illness: The appropriate patient status for this patient is OBSERVATION. Observation status is judged to be reasonable and necessary in order to provide the required intensity of service to ensure the patient's safety. The patient's presenting symptoms, physical exam findings, and initial radiographic and laboratory data in the context of their medical condition is felt to place them at decreased risk for further clinical deterioration. Furthermore, it is anticipated that the patient will be medically stable for discharge from the hospital within 2 midnights of admission.     Ciarrah Rae, MD [facility]  How to contact the [facility] Attending or Consulting provider 7A - 7P or covering provider during after hours 7P -7A, for this patient.  Check the care team in [facility] and look for a) attending/consulting [facility] provider listed and b) the TR[facility] team listed Log into [email or web address] and use [facility] universal password to access. If you do not have the password, please contact the hospital operator. Locate the [facility] provider you are looking for under [facility] and page to a number that you can be directly reached. If you still have difficulty reaching the provider, please page the [facility] (Director on Call) for the Hospitalists listed on amion for assistance.  12/13/2023, 11:28 PM

## 2023-12-13 NOTE — ED Triage Notes (Signed)
 Pt recently treated for UTI and only complaint today is weakness.

## 2023-12-13 NOTE — ED Triage Notes (Signed)
 BIB EMS from home. Dizziness upon standing had near syncopal episode and helped herself to ground. Denies LOC no injury.   EMS VS 500cc normal saline in 20 RAC  122/70 51 pulse 99% RA CBG 150

## 2023-12-14 ENCOUNTER — Observation Stay (HOSPITAL_BASED_OUTPATIENT_CLINIC_OR_DEPARTMENT_OTHER)

## 2023-12-14 DIAGNOSIS — Y92009 Unspecified place in unspecified non-institutional (private) residence as the place of occurrence of the external cause: Secondary | ICD-10-CM | POA: Diagnosis not present

## 2023-12-14 DIAGNOSIS — R001 Bradycardia, unspecified: Secondary | ICD-10-CM | POA: Diagnosis not present

## 2023-12-14 DIAGNOSIS — W19XXXA Unspecified fall, initial encounter: Secondary | ICD-10-CM | POA: Diagnosis not present

## 2023-12-14 DIAGNOSIS — R55 Syncope and collapse: Secondary | ICD-10-CM

## 2023-12-14 DIAGNOSIS — E861 Hypovolemia: Secondary | ICD-10-CM | POA: Diagnosis not present

## 2023-12-14 DIAGNOSIS — I10 Essential (primary) hypertension: Secondary | ICD-10-CM | POA: Diagnosis not present

## 2023-12-14 LAB — ECHOCARDIOGRAM COMPLETE
AR max vel: 2.21 cm2
AV Area VTI: 2.55 cm2
AV Area mean vel: 2.42 cm2
AV Mean grad: 4 mmHg
AV Peak grad: 7.5 mmHg
Ao pk vel: 1.37 m/s
Area-P 1/2: 2.11 cm2
Height: 62 in
S' Lateral: 2.8 cm
Weight: 3033.53 [oz_av]

## 2023-12-14 LAB — COMPREHENSIVE METABOLIC PANEL WITH GFR
ALT: 19 U/L (ref 0–44)
AST: 20 U/L (ref 15–41)
Albumin: 3 g/dL — ABNORMAL LOW (ref 3.5–5.0)
Alkaline Phosphatase: 79 U/L (ref 38–126)
Anion gap: 8 (ref 5–15)
BUN: 6 mg/dL — ABNORMAL LOW (ref 8–23)
CO2: 21 mmol/L — ABNORMAL LOW (ref 22–32)
Calcium: 9.7 mg/dL (ref 8.9–10.3)
Chloride: 109 mmol/L (ref 98–111)
Creatinine, Ser: 0.7 mg/dL (ref 0.44–1.00)
GFR, Estimated: >60 mL/min (ref >60)
Glucose, Bld: 78 mg/dL (ref 70–99)
Potassium: 4.2 mmol/L (ref 3.5–5.1)
Sodium: 138 mmol/L (ref 135–145)
Total Bilirubin: 0.5 mg/dL (ref 0.0–1.2)
Total Protein: 5.9 g/dL — ABNORMAL LOW (ref 6.5–8.1)

## 2023-12-14 LAB — CBC
HCT: 41.2 % (ref 36.0–46.0)
Hemoglobin: 12.7 g/dL (ref 12.0–15.0)
MCH: 32.3 pg (ref 26.0–34.0)
MCHC: 30.8 g/dL (ref 30.0–36.0)
MCV: 104.8 fL — ABNORMAL HIGH (ref 80.0–100.0)
Platelets: 120 10*3/uL — ABNORMAL LOW (ref 150–400)
RBC: 3.93 MIL/uL (ref 3.87–5.11)
RDW: 14.4 % (ref 11.5–15.5)
WBC: 10 10*3/uL (ref 4.0–10.5)
nRBC: 0 % (ref 0.0–0.2)

## 2023-12-14 MED ORDER — FOSFOMYCIN TROMETHAMINE 3 G PO PACK
3.0000 g | PACK | Freq: Once | ORAL | Status: AC
  Administered 2023-12-14: 3 g via ORAL
  Filled 2023-12-14: qty 3

## 2023-12-14 MED ORDER — FUROSEMIDE 20 MG PO TABS: 20.0000 mg | ORAL_TABLET | Freq: Every day | ORAL | Status: AC | PRN

## 2023-12-14 NOTE — Assessment & Plan Note (Signed)
 12-14-2023 due to dehydration and BP meds. Resolved after IVF and holding HTN meds.

## 2023-12-14 NOTE — ED Notes (Signed)
 Patient transported to vascular.

## 2023-12-14 NOTE — Assessment & Plan Note (Signed)
 12-14-2023 on cymbalta . Stable.

## 2023-12-14 NOTE — Assessment & Plan Note (Signed)
 12-14-2023 stable. On lipitor

## 2023-12-14 NOTE — ED Notes (Signed)
 Report received from roderick, Charity fundraiser.  Pt to be transported to room 7.

## 2023-12-14 NOTE — Assessment & Plan Note (Signed)
 12-14-2023 stable. Continue Exelon .

## 2023-12-14 NOTE — ED Notes (Signed)
 Pt requested to call daughter & daughter eleanor was called

## 2023-12-14 NOTE — Progress Notes (Signed)
 Physical Therapy One-Time Evaluation Patient Details Name: Janet Le MRN: [id number] DOB: 11/27/1955 Today's Date: 12/14/2023  History of Present Illness  Janet Le is a 68 y.o. female who presented to the ED 12/13/23 after fall at home.  PMHx: Lewy body dementia with mood disturbance, essential hypertension, DM type II, hyperlipidemia, generalized anxiety disorder, insomnia, GERD, hyperlipidemia, chronic constipation, and morbid obesity.   Clinical Impression  Pt admitted with above diagnosis. Examination limited by pt being a poor historian and unable to reach her daughter over the phone during session. PTA, pt was modI for functional mobility using a rollator, required cognitive assist for ADLs, and assistance with IADLs. She lives in a ground level apartment with a level entry. Per chart review from March 2025, pt has 24/7 supervision and assist available from family. Pt reports multiple falls in the last 6 months, but is unsure of the exact number. Pt currently with functional limitations due to the deficits listed below (see PT Problem List). She performed supine>sit and stand>sit with modI. Pt ambulated ~163ft using RW with supervision. She becomes easily distracted and will develop increase postural sway. Recommend HHPT to improve balance, decrease fall risk, and optimize safety within the home environment.        If plan is discharge home, recommend the following: Supervision due to cognitive status;Help with stairs or ramp for entrance;A little help with bathing/dressing/bathroom;Assistance with cooking/housework;Direct supervision/assist for medications management;Direct supervision/assist for financial management;Assist for transportation   Can travel by private vehicle        Equipment Recommendations None recommended by PT (Pt already has DME)  Recommendations for Other Services       Functional Status Assessment Patient has had a recent decline in their functional  status and demonstrates the ability to make significant improvements in function in a reasonable and predictable amount of time.     Precautions / Restrictions Precautions Precautions: Fall Recall of Precautions/Restrictions: Intact Restrictions Weight Bearing Restrictions Per Provider Order: No      Mobility  Bed Mobility Overal bed mobility: Needs Assistance Bed Mobility: Sit to Supine       Sit to supine: Modified independent (Device/Increase time)   General bed mobility comments: Pt greeted standing in room. She returned to bed without physical assist or cues for sequencing.    Transfers Overall transfer level: Modified independent Equipment used: Rolling walker (2 wheels)               General transfer comment: Pt greeted standing in room. Introduced RW and cued pt on hand placement and sequencing as she is used to a rollator. Good eccentric control with sitting.    Ambulation/Gait Ambulation/Gait assistance: Supervision Gait Distance (Feet): 100 Feet Assistive device: Rolling walker (2 wheels) Gait Pattern/deviations: Step-through pattern, Decreased stride length, Drifts right/left Gait velocity: baseline for pt Gait velocity interpretation: <1.8 ft/sec, indicate of risk for recurrent falls   General Gait Details: Pt ambulated with a reciprocal gait pattern, even weight shift, and good foot clearence. Cues for proximity to RW. Pt became distracted easily and would have increased postural sway. No LOB.  Stairs            Wheelchair Mobility     Tilt Bed    Modified Rankin (Stroke Patients Only)       Balance Overall balance assessment: History of Falls, Needs assistance Sitting-balance support: Bilateral upper extremity supported, Feet unsupported Sitting balance-Leahy Scale: Fair Sitting balance - Comments: Pt sat EOB and was able to  scoot with UE support.   Standing balance support: No upper extremity supported, During functional activity,  Bilateral upper extremity supported Standing balance-Leahy Scale: Fair Standing balance comment: Pt greeted standing moving blankets within her room without AD. She benefitted from RW for increased support during gait. Pt becomes distracted and demonstrated increased postural sway, no LOB.                             Pertinent Vitals/Pain Pain Assessment Pain Assessment: No/denies pain    Home Living Family/patient expects to be discharged to:: Private residence Living Arrangements: Other relatives (Grandson lives with her on/off) Available Help at Discharge: Family;Available 24 hours/day (Pt reports daughter checks in fairly consistently. Per chart review back in March 2025 pt has 24/7 supervision and assist.) Type of Home: Apartment Home Access: Level entry       Home Layout: One level Home Equipment: Educational psychologist (4 wheels);Cane - single point Additional Comments: Pt reports alternating which place her family gets together at. Her daughter's home has 4 STE with R handrail.    Prior Function Prior Level of Function : Patient poor historian/Family not available;Needs assist (Pt attempted to call daughter on phone during PT session, but was unable to reach her despite 3 attempts.)  Cognitive Assist : ADLs (cognitive)           Mobility Comments: Ambulates using rollator. Pt reports multiple falls in the last 70mo but is unsure of the number. ADLs Comments: She states she is modI with the majority of ADLs. Pt reports her daughter/grandson may help her with a sponge bath.  Relies on family for transporation as her MD took her license away. Per chart review back in March 2025 pt has supervision while in the bathroom d/t fear of falling and manages her own medications.     Extremity/Trunk Assessment   Upper Extremity Assessment Upper Extremity Assessment: Defer to OT evaluation    Lower Extremity Assessment Lower Extremity Assessment: Overall WFL for tasks  assessed    Cervical / Trunk Assessment Cervical / Trunk Assessment: Normal  Communication   Communication Communication: No apparent difficulties    Cognition Arousal: Alert Behavior During Therapy: WFL for tasks assessed/performed   PT - Cognitive impairments: History of cognitive impairments (Lewy body dementia)                         Following commands: Intact       Cueing Cueing Techniques: Verbal cues     General Comments General comments (skin integrity, edema, etc.): VSS on RA    Exercises     Assessment/Plan    PT Assessment All further PT needs can be met in the next venue of care  PT Problem List Decreased balance;Decreased mobility;Decreased safety awareness;Decreased activity tolerance       PT Treatment Interventions      PT Goals (Current goals can be found in the Care Plan section)  Acute Rehab PT Goals Patient Stated Goal: Return Home PT Goal Formulation: With patient    Frequency       Co-evaluation               AM-PAC PT 6 Clicks Mobility  Outcome Measure Help needed turning from your back to your side while in a flat bed without using bedrails?: None Help needed moving from lying on your back to sitting on the side of a flat bed without  using bedrails?: None Help needed moving to and from a bed to a chair (including a wheelchair)?: A Little Help needed standing up from a chair using your arms (e.g., wheelchair or bedside chair)?: None Help needed to walk in hospital room?: A Little Help needed climbing 3-5 steps with a railing? : A Little 6 Click Score: 21    End of Session Equipment Utilized During Treatment: Gait belt Activity Tolerance: Patient tolerated treatment well Patient left: in bed;with call bell/phone within reach Nurse Communication: Mobility status PT Visit Diagnosis: History of falling (Z91.81);Unsteadiness on feet (R26.81)    Time: 8949-8887 PT Time Calculation (min) (ACUTE ONLY): 22  min   Charges:   PT Evaluation $PT Eval Low Complexity: 1 Low   PT General Charges $$ ACUTE PT VISIT: 1 Visit         Randall SAUNDERS, PT, DPT Acute Rehabilitation Services Office: [phone] Secure Chat Preferred  Delon CHRISTELLA Callander 12/14/2023, 12:39 PM

## 2023-12-14 NOTE — Assessment & Plan Note (Signed)
 12-14-2023 stable. On cymbalta .

## 2023-12-14 NOTE — Assessment & Plan Note (Signed)
 12-14-2023 holding inderal  and lasix  due to hypotenion and bradycardia. She can continue micardis  at home. Will need HTN management early next week by PCP. Will change lasix  to prn. Holding inderal  due to bradycardia.

## 2023-12-14 NOTE — Progress Notes (Signed)
 PROGRESS NOTE    Janet Le  FMW:[id number] DOB: [birth year] DOA: 12/13/2023 PCP: Geofm Glade PARAS, MD  Subjective: Pt seen and examined. Pt lives with dtr. Grandson stays with her as well. Pt got up from kitchen table yesterday. After she stood up, she felt weak in the legs.  Pt takes lasix  in the AM. She noticed dark urine in the evening 2 days ago. No burning with urination.  Also taking inderal  for BP.   Hospital Course: HPI: Janet Le is a 68 y.o. female with medical history significant of Lewy body dementia with mood disturbance, essential hypertension, DM type II, hyperlipidemia, generalized anxiety disorder, insomnia, GERD, hyperlipidemia, chronic constipation, and morbid obesity presented to emergency department complaining of fall from a standing position.  Patient reported that her knee has been buckled and she fell on the ground.  Patient's daughter helped to get up.  Denies any head injury, back injury and extremity pain.Denies headache, chest pain, shortness of breath, syncope or dizziness.  She states leg buckling is a recurrent issue for her.  Right now she feels fine.  She is currently finishing up antibiotics for a UTI. No unilateral weakness.    Daughter at the bedside reported that patient has 2 episodes of fall yesterday.  Patient uses walker at baseline.   Patient denies any dizziness, headache, tingling, tremor, sensory change, speech change, focal weakness, generalized weakness, seizure and loss of consciousness.  Denies any chest pain, palpitation and lower extremities swelling.   ED Course:  At presentation to ED patient found bradycardic heart rate dropped to 50, hypotensive blood pressure 81/65 which has been improved now.  Otherwise hemodynamically stable.   CBC showed leukocytosis 10.9, elevated MCV and normal platelet and H&H.  CMP unremarkable.   blood glucose within normal range. Troponin T normal range. EKG showed sinus bradycardia heart rate 49.    CT head no acute endocrine abnormality. X-ray no active disease process.  It showed cardiomegaly.     In the ED patient has been given 1 L of LR bolus.   Hospitalist has been consulted for further evaluation management of near syncope and fall in the setting of hypotension and  Significant Events: Admitted 12/13/2023 for near syncope   Admission Labs: WBC 10.9, HgB 12.3, plt 152 Na 138, K 4.6, CO2 of 24, BUN 8, Scr 0.83, glu 89 UA cloudy, small LE, neg nitrite, WBC 21-50, few bacteria  Admission Imaging Studies: CXR No active disease. Mild cardiomegaly  CT head No acute intracranial abnormality is identified.   Significant Labs:   Significant Imaging Studies:   Antibiotic Therapy: Anti-infectives (From admission, onward)    None       Procedures:   Consultants:     Assessment and Plan: * Fall at home, initial encounter 12-14-2023 likely due to dehydration and bradycardia. Echo pending. Have asked for orthostatic VS. Pt is not orthostatic after IVF.  Discussed that she needs to stay hydrated. Lasix  was Rx for LE edema. Pt may need to wear ACE bandages for compression due to her hx of lymphedema. I think her legs are too big for TED hose. Will need to change lasix  to prn instead of daily.  She can go home later today if cleared by PT/OT.    Hypotension 12-14-2023 due to dehydration and BP meds. Resolved after IVF and holding HTN meds.  Sinus bradycardia 12-14-2023 stop inderal . May need different HTN management meds. Will need f/u with PCP early next week for BP management.  Pt sees Dr. Geofm.  Near syncope 12-14-2023 likely due to dehydration and bradycardia. Echo pending. Have asked for orthostatic VS. Pt is not orthostatic after IVF.  Discussed that she needs to stay hydrated. Lasix  was Rx for LE edema. Pt may need to wear ACE bandages for compression due to her hx of lymphedema. I think her legs are too big for TED hose. Will need to change lasix  to prn  instead of daily.    Insomnia 12-14-2023  stable.  Lewy body dementia 12-14-2023 stable. Continue Exelon .  Non-insulin  dependent type 2 diabetes mellitus (HCC) 12-14-2023 stable. Continue ozempic  after discharge. CBG stable.  GAD (generalized anxiety disorder) 12-14-2023 stable. On cymbalta .  Cervical radiculopathy 12-14-2023 stable. On cymbalta    Major depressive disorder 12-14-2023 on cymbalta . Stable.  Left lumbar radiculopathy 12-14-2023 stable. On cymbalta .  Essential hypertension 12-14-2023 holding inderal  and lasix  due to hypotenion and bradycardia. She can continue micardis  at home. Will need HTN management early next week by PCP. Will change lasix  to prn. Holding inderal  due to bradycardia.  HLD (hyperlipidemia) 12-14-2023 stable. On lipitor      DVT prophylaxis: enoxaparin  (LOVENOX ) injection 40 mg Start: 12/14/23 0800 SCDs Start: 12/13/23 2254 Place TED hose Start: 12/13/23 2254     Code Status: Full Code Family Communication: no family at bedside Disposition Plan: return home Reason for continuing need for hospitalization: medically stable. Awaiting PT/OT evaluation.  Objective: Vitals:   12/13/23 2300 12/14/23 0215 12/14/23 0829 12/14/23 0830  BP:  (!) 153/70 120/70 128/72  Pulse:  (!) 51 62 (!) 58  Resp:  16 14 13   Temp: 98.1 F (36.7 C)  97.6 F (36.4 C)   TempSrc: Oral  Oral   SpO2:  100% 99% 100%  Weight:      Height:        Intake/Output Summary (Last 24 hours) at 12/14/2023 1212 Last data filed at 12/14/2023 0806 Gross per 24 hour  Intake 2054 ml  Output --  Net 2054 ml   Filed Weights   12/13/23 1911 12/13/23 1936  Weight: 86 kg 86 kg    Examination:  Physical Exam Vitals and nursing note reviewed.  Constitutional:      General: She is not in acute distress.    Appearance: She is obese. She is not toxic-appearing.  HENT:     Head: Normocephalic and atraumatic.     Nose: Nose normal.   Eyes:     General: No scleral  icterus.   Cardiovascular:     Rate and Rhythm: Regular rhythm. Bradycardia present.  Pulmonary:     Effort: Pulmonary effort is normal.     Breath sounds: Normal breath sounds.  Abdominal:     General: Abdomen is flat and protuberant.     Palpations: Abdomen is soft.     Tenderness: There is no abdominal tenderness. There is no guarding.   Musculoskeletal:     Comments: Non-pitting edema of both lower legs/ankles/feet  Posterior calf compartments are soft and pliable.   Skin:    General: Skin is warm and dry.     Capillary Refill: Capillary refill takes less than 2 seconds.   Neurological:     General: No focal deficit present.     Mental Status: She is alert and oriented to person, place, and time.    Data Reviewed: I have personally reviewed following labs and imaging studies  CBC: Recent Labs  Lab 12/13/23 1913 12/14/23 0516  WBC 10.9* 10.0  HGB 12.3 12.7  HCT 37.7 41.2  MCV 100.3* 104.8*  PLT 152 120*   Basic Metabolic Panel: Recent Labs  Lab 12/13/23 1913 12/14/23 0516  NA 138 138  K 4.6 4.2  CL 109 109  CO2 24 21*  GLUCOSE 89 78  BUN 8 6*  CREATININE 0.83 0.70  CALCIUM  9.3 9.7   GFR: Estimated Creatinine Clearance: 68.5 mL/min (by C-G formula based on SCr of 0.7 mg/dL). Liver Function Tests: Recent Labs  Lab 12/13/23 1913 12/14/23 0516  AST 28 20  ALT 11 19  ALKPHOS 77 79  BILITOT 1.2 0.5  PROT 5.6* 5.9*  ALBUMIN 2.9* 3.0*   CBG: Recent Labs  Lab 12/13/23 1952  GLUCAP 105*    Recent Results (from the past 240 hours)  Urine Culture     Status: None   Collection Time: 12/06/23  4:23 PM   Specimen: Urine  Result Value Ref Range Status   Source: URINE  Final   Status: FINAL  Final   Result:   Final    Mixed genital flora isolated. These superficial bacteria are not indicative of a urinary tract infection. No further organism identification is warranted on this specimen. If clinically indicated, recollect clean-catch, mid-stream  urine and transfer  immediately to Urine Culture Transport Tube.      Radiology Studies: ECHOCARDIOGRAM COMPLETE Result Date: 12/14/2023    ECHOCARDIOGRAM REPORT   Patient Name:   DARCEL ZICK [facility] Date of Exam: 12/14/2023 Medical Rec #:  [id number]        Height:       62.0 in Accession #:    [id number]       Weight:       189.6 lb Date of Birth:  1956/01/25        BSA:          1.869 m Patient Age:    68 years         BP:           120/70 mmHg Patient Gender: F                HR:           60 bpm. Exam Location:  Inpatient Procedure: 2D Echo, Cardiac Doppler and Color Doppler (Both Spectral and Color            Flow Doppler were utilized during procedure). Indications:    Syncope R55  History:        Patient has no prior history of Echocardiogram examinations.                 Signs/Symptoms:Syncope; Risk Factors:Diabetes and Hypertension.  Sonographer:    Jayson Gaskins Referring Phys: [id number] SUBRINA SUNDIL IMPRESSIONS  1. Left ventricular ejection fraction, by estimation, is 55 to 60%. Left ventricular ejection fraction by PLAX is 58 %. The left ventricle has normal function. The left ventricle has no regional wall motion abnormalities. Left ventricular diastolic parameters are consistent with Grade I diastolic dysfunction (impaired relaxation).  2. Right ventricular systolic function is normal. The right ventricular size is normal.  3. The mitral valve is grossly normal. No evidence of mitral valve regurgitation.  4. The aortic valve is tricuspid. Aortic valve regurgitation is not visualized. Comparison(s): No prior Echocardiogram. FINDINGS  Left Ventricle: Left ventricular ejection fraction, by estimation, is 55 to 60%. Left ventricular ejection fraction by PLAX is 58 %. The left ventricle has normal function. The left ventricle has no regional wall motion abnormalities. The left ventricular internal  cavity size was normal in size. There is no left ventricular hypertrophy. Left ventricular diastolic  parameters are consistent with Grade I diastolic dysfunction (impaired relaxation). Indeterminate filling pressures. Right Ventricle: The right ventricular size is normal. No increase in right ventricular wall thickness. Right ventricular systolic function is normal. Left Atrium: Left atrial size was normal in size. Right Atrium: Right atrial size was normal in size. Pericardium: There is no evidence of pericardial effusion. Mitral Valve: The mitral valve is grossly normal. No evidence of mitral valve regurgitation. Tricuspid Valve: The tricuspid valve is grossly normal. Tricuspid valve regurgitation is trivial. Aortic Valve: The aortic valve is tricuspid. Aortic valve regurgitation is not visualized. Aortic valve mean gradient measures 4.0 mmHg. Aortic valve peak gradient measures 7.5 mmHg. Aortic valve area, by VTI measures 2.55 cm. Pulmonic Valve: The pulmonic valve was grossly normal. Pulmonic valve regurgitation is trivial. Aorta: The aortic root and ascending aorta are structurally normal, with no evidence of dilitation. Venous: The inferior vena cava was not well visualized. IAS/Shunts: No atrial level shunt detected by color flow Doppler.  LEFT VENTRICLE PLAX 2D LV EF:         Left            Diastology                ventricular     LV e' medial:    7.72 cm/s                ejection        LV E/e' medial:  7.7                fraction by     LV e' lateral:   7.62 cm/s                PLAX is 58      LV E/e' lateral: 7.8                %. LVIDd:         4.00 cm LVIDs:         2.80 cm LV PW:         1.00 cm LV IVS:        0.70 cm LVOT diam:     1.90 cm LV SV:         70 LV SV Index:   37 LVOT Area:     2.84 cm  RIGHT VENTRICLE RV S prime:     16.10 cm/s TAPSE (M-mode): 3.0 cm LEFT ATRIUM           Index        RIGHT ATRIUM           Index LA Vol (A2C): 24.6 ml 13.16 ml/m  RA Area:     15.00 cm LA Vol (A4C): 24.6 ml 13.16 ml/m  RA Volume:   33.40 ml  17.87 ml/m  AORTIC VALVE AV Area (Vmax):    2.21 cm AV  Area (Vmean):   2.42 cm AV Area (VTI):     2.55 cm AV Vmax:           137.00 cm/s AV Vmean:          94.200 cm/s AV VTI:            0.274 m AV Peak Grad:      7.5 mmHg AV Mean Grad:      4.0 mmHg LVOT Vmax:  107.00 cm/s LVOT Vmean:        80.500 cm/s LVOT VTI:          0.246 m LVOT/AV VTI ratio: 0.90  AORTA Ao Root diam: 2.70 cm MITRAL VALVE               TRICUSPID VALVE MV Area (PHT): 2.11 cm    TR Peak grad:   24.4 mmHg MV Decel Time: 359 msec    TR Vmax:        247.00 cm/s MV E velocity: 59.70 cm/s MV A velocity: 92.20 cm/s  SHUNTS MV E/A ratio:  0.65        Systemic VTI:  0.25 m                            Systemic Diam: 1.90 cm Vinie Maxcy MD Electronically signed by Vinie Maxcy MD Signature Date/Time: 12/14/2023/11:53:37 AM    Final    CT Head Wo Contrast Result Date: 12/13/2023 CLINICAL DATA:  Head trauma, minor (Age >= 65y) EXAM: CT HEAD WITHOUT CONTRAST TECHNIQUE: Contiguous axial images were obtained from the base of the skull through the vertex without intravenous contrast. RADIATION DOSE REDUCTION: This exam was performed according to the departmental dose-optimization program which includes automated exposure control, adjustment of the mA and/or kV according to patient size and/or use of iterative reconstruction technique. COMPARISON:  11/14/2023 FINDINGS: Brain: No evidence of acute infarction, hemorrhage, hydrocephalus, extra-axial collection or mass lesion/mass effect. Vascular: No hyperdense vessel or unexpected calcification. Skull: Normal. Negative for fracture or focal lesion. Sinuses/Orbits: No acute finding. Postsurgical changes noted within the visualized left paranasal sinuses Other: None. IMPRESSION: 1. No acute intracranial abnormality is identified. Electronically Signed   By: Dorethia Molt M.D.   On: 12/13/2023 21:44   DG Chest Portable 1 View Result Date: 12/13/2023 CLINICAL DATA:  Syncope EXAM: PORTABLE CHEST 1 VIEW COMPARISON:  11/14/2023 FINDINGS: Mild cardiomegaly.  No acute airspace disease, pleural effusion, or pneumothorax IMPRESSION: No active disease. Mild cardiomegaly. Electronically Signed   By: Luke Bun M.D.   On: 12/13/2023 20:57    Scheduled Meds:  DULoxetine   60 mg Oral Daily   enoxaparin  (LOVENOX ) injection  40 mg Subcutaneous Q24H   rivastigmine   6 mg Oral BID   sodium chloride  flush  3 mL Intravenous Q12H   sodium chloride  flush  3 mL Intravenous Q12H   Continuous Infusions:  sodium chloride        LOS: 0 days   Time spent: 55 minutes  Camellia Door, DO  [facility]  12/14/2023, 12:12 PM

## 2023-12-14 NOTE — Assessment & Plan Note (Addendum)
 12-14-2023 likely due to dehydration and bradycardia. Echo shows LVEF 55 to 60%.. Have asked for orthostatic VS. Pt is not orthostatic after IVF.  Discussed that she needs to stay hydrated. Lasix  was Rx for LE edema. Pt may need to wear ACE bandages for compression due to her hx of lymphedema. I think her legs are too big for TED hose. Will need to change lasix  to prn instead of daily.  She can go home later today if cleared by PT/OT.

## 2023-12-14 NOTE — Care Management Obs Status (Signed)
 MEDICARE OBSERVATION STATUS NOTIFICATION   Patient Details  Name: Janet Le MRN: [id number] Date of Birth: 04-02-1956   Medicare Observation Status Notification Given:  Yes    [name] JAYSON Canary, RN 12/14/2023, 10:06 AM

## 2023-12-14 NOTE — Assessment & Plan Note (Addendum)
 12-14-2023 stop inderal . May need different HTN management meds. Will need f/u with PCP early next week for BP management. Pt sees Dr. Geofm.

## 2023-12-14 NOTE — Care Management (Addendum)
 Transition of Care [facility]) - Inpatient Brief Assessment   Patient Details  Name: Janet Le MRN: [id number] Date of Birth: 07-Sep-1955  Transition of [facility]) CM/SW Contact:    [name] JAYSON Canary, RN Phone Number: 12/14/2023, 10:11 AM   Clinical Narrative:  Spoke to patient at bedside regarding discharge planning. Patient presented with syncope.  She lives alone and has a cane and walker, she frequently goes back and forth between daughters house and her house. She has a grandchild that stays with her at times as well. She is agreeable to home health services She states she had home health but did not get a long with the  person for PT. PING states [facility] is current provider. Messaged Katina with [facility] for discipline and verification.  1030 Verified that she is active with [facility] for RN and aide. 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

## 2023-12-14 NOTE — Assessment & Plan Note (Signed)
 12-14-2023  stable.

## 2023-12-14 NOTE — Hospital Course (Signed)
 HPI: Janet Le is a 68 y.o. female with medical history significant of Lewy body dementia with mood disturbance, essential hypertension, DM type II, hyperlipidemia, generalized anxiety disorder, insomnia, GERD, hyperlipidemia, chronic constipation, and morbid obesity presented to emergency department complaining of fall from a standing position.  Patient reported that her knee has been buckled and she fell on the ground.  Patient's daughter helped to get up.  Denies any head injury, back injury and extremity pain.Denies headache, chest pain, shortness of breath, syncope or dizziness.  She states leg buckling is a recurrent issue for her.  Right now she feels fine.  She is currently finishing up antibiotics for a UTI. No unilateral weakness.    Daughter at the bedside reported that patient has 2 episodes of fall yesterday.  Patient uses walker at baseline.   Patient denies any dizziness, headache, tingling, tremor, sensory change, speech change, focal weakness, generalized weakness, seizure and loss of consciousness.  Denies any chest pain, palpitation and lower extremities swelling.   ED Course:  At presentation to ED patient found bradycardic heart rate dropped to 50, hypotensive blood pressure 81/65 which has been improved now.  Otherwise hemodynamically stable.   CBC showed leukocytosis 10.9, elevated MCV and normal platelet and H&H.  CMP unremarkable.   blood glucose within normal range. Troponin T normal range. EKG showed sinus bradycardia heart rate 49.   CT head no acute endocrine abnormality. X-ray no active disease process.  It showed cardiomegaly.     In the ED patient has been given 1 L of LR bolus.   Hospitalist has been consulted for further evaluation management of near syncope and fall in the setting of hypotension and  Significant Events: Admitted 12/13/2023 for near syncope   Admission Labs: WBC 10.9, HgB 12.3, plt 152 Na 138, K 4.6, CO2 of 24, BUN 8, Scr 0.83, glu  89 UA cloudy, small LE, neg nitrite, WBC 21-50, few bacteria  Admission Imaging Studies: CXR No active disease. Mild cardiomegaly  CT head No acute intracranial abnormality is identified.   Significant Labs:   Significant Imaging Studies:   Antibiotic Therapy: Anti-infectives (From admission, onward)    None       Procedures:   Consultants:

## 2023-12-14 NOTE — Assessment & Plan Note (Signed)
 12-14-2023 stable. Continue ozempic  after discharge. CBG stable.

## 2023-12-14 NOTE — Discharge Summary (Signed)
 [facility] Physician Discharge Summary   Patient name: Janet Le  Admit date:     12/13/2023  Discharge date: 12/14/2023  Attending Physician: SUNDIL, SUBRINA [[id number]]  Discharge Physician: Camellia Door   PCP: Geofm Glade PARAS, MD  Admitted From: Home  Disposition:  Home  Recommendations for Outpatient Follow-up:  Follow up with PCP in 1-2 weeks  Home Health:Yes. Home PT/OT Equipment/Devices: None    Discharge Condition:Stable CODE STATUS:FULL Diet recommendation: Heart Healthy/Diabetic Fluid Restriction: None  Hospital Summary: HPI: Janet Le is a 68 y.o. female with medical history significant of Lewy body dementia with mood disturbance, essential hypertension, DM type II, hyperlipidemia, generalized anxiety disorder, insomnia, GERD, hyperlipidemia, chronic constipation, and morbid obesity presented to emergency department complaining of fall from a standing position.  Patient reported that her knee has been buckled and she fell on the ground.  Patient's daughter helped to get up.  Denies any head injury, back injury and extremity pain.Denies headache, chest pain, shortness of breath, syncope or dizziness.  She states leg buckling is a recurrent issue for her.  Right now she feels fine.  She is currently finishing up antibiotics for a UTI. No unilateral weakness.    Daughter at the bedside reported that patient has 2 episodes of fall yesterday.  Patient uses walker at baseline.   Patient denies any dizziness, headache, tingling, tremor, sensory change, speech change, focal weakness, generalized weakness, seizure and loss of consciousness.  Denies any chest pain, palpitation and lower extremities swelling.   ED Course:  At presentation to ED patient found bradycardic heart rate dropped to 50, hypotensive blood pressure 81/65 which has been improved now.  Otherwise hemodynamically stable.   CBC showed leukocytosis 10.9, elevated MCV and normal platelet and H&H.   CMP unremarkable.   blood glucose within normal range. Troponin T normal range. EKG showed sinus bradycardia heart rate 49.   CT head no acute endocrine abnormality. X-ray no active disease process.  It showed cardiomegaly.     In the ED patient has been given 1 L of LR bolus.   Hospitalist has been consulted for further evaluation management of near syncope and fall in the setting of hypotension and  Significant Events: Admitted 12/13/2023 for near syncope   Admission Labs: WBC 10.9, HgB 12.3, plt 152 Na 138, K 4.6, CO2 of 24, BUN 8, Scr 0.83, glu 89 UA cloudy, small LE, neg nitrite, WBC 21-50, few bacteria  Admission Imaging Studies: CXR No active disease. Mild cardiomegaly  CT head No acute intracranial abnormality is identified.   Significant Labs:   Significant Imaging Studies:   Antibiotic Therapy: Anti-infectives (From admission, onward)    None       Procedures:   Consultants:    Hospital Course by Problem: * Fall at home, initial encounter 12-14-2023 likely due to dehydration and bradycardia. Echo shows LVEF 55 to 60%.. Have asked for orthostatic VS. Pt is not orthostatic after IVF.  Discussed that she needs to stay hydrated. Lasix  was Rx for LE edema. Pt may need to wear ACE bandages for compression due to her hx of lymphedema. I think her legs are too big for TED hose. Will need to change lasix  to prn instead of daily.  She can go home later today if cleared by PT/OT.    Hypotension 12-14-2023 due to dehydration and BP meds. Resolved after IVF and holding HTN meds.  Sinus bradycardia 12-14-2023 stop inderal . May need different HTN management meds. Will need f/u  with PCP early next week for BP management. Pt sees Dr. Geofm.  Near syncope 12-14-2023 likely due to dehydration and bradycardia. Echo shows LVEF 55 to 60%.. Have asked for orthostatic VS. Pt is not orthostatic after IVF.  Discussed that she needs to stay hydrated. Lasix  was Rx for LE  edema. Pt may need to wear ACE bandages for compression due to her hx of lymphedema. I think her legs are too big for TED hose. Will need to change lasix  to prn instead of daily.  She can go home later today if cleared by PT/OT.    Insomnia 12-14-2023  stable.  Lewy body dementia 12-14-2023 stable. Continue Exelon .  Non-insulin  dependent type 2 diabetes mellitus (HCC) 12-14-2023 stable. Continue ozempic  after discharge. CBG stable.  GAD (generalized anxiety disorder) 12-14-2023 stable. On cymbalta .  Cervical radiculopathy 12-14-2023 stable. On cymbalta    Major depressive disorder 12-14-2023 on cymbalta . Stable.  Left lumbar radiculopathy 12-14-2023 stable. On cymbalta .  Essential hypertension 12-14-2023 holding inderal  and lasix  due to hypotenion and bradycardia. She can continue micardis  at home. Will need HTN management early next week by PCP. Will change lasix  to prn. Holding inderal  due to bradycardia.  HLD (hyperlipidemia) 12-14-2023 stable. On lipitor    Discharge Diagnoses:  Principal Problem:   Fall at home, initial encounter Active Problems:   Near syncope   Sinus bradycardia   Hypotension   HLD (hyperlipidemia)   Essential hypertension   Left lumbar radiculopathy   Major depressive disorder   Cervical radiculopathy   GAD (generalized anxiety disorder)   Non-insulin  dependent type 2 diabetes mellitus (HCC)   Lewy body dementia   Insomnia   Discharge Instructions  Discharge Instructions     Call MD for:  difficulty breathing, headache or visual disturbances   Complete by: As directed    Call MD for:  extreme fatigue   Complete by: As directed    Call MD for:  hives   Complete by: As directed    Call MD for:  persistant dizziness or light-headedness   Complete by: As directed    Call MD for:  persistant nausea and vomiting   Complete by: As directed    Call MD for:  redness, tenderness, or signs of infection (pain, swelling, redness, odor or  green/yellow discharge around incision site)   Complete by: As directed    Call MD for:  severe uncontrolled pain   Complete by: As directed    Call MD for:  temperature >100.4   Complete by: As directed    Diet - low sodium heart healthy   Complete by: As directed    Diet Carb Modified   Complete by: As directed    Discharge instructions   Complete by: As directed    1. Follow up with your primary care provider in 1-2 weeks following discharge from hospital.   Increase activity slowly   Complete by: As directed       Allergies as of 12/14/2023       Reactions   Sertraline Hcl Other (See Comments)   Spasms; numbness   Ace Inhibitors Cough   Codeine Palpitations   Darifenacin Hydrobromide Er Other (See Comments)   Pt states made her feel queezy & drunk   Gabapentin  Other (See Comments)   Hallucinations   Pravastatin  Other (See Comments)   myalgias   Propoxyphene Hcl Palpitations   Seroquel  [quetiapine ] Other (See Comments)   Woke up screaming and yelling after taking it   Wellbutrin [bupropion Hcl]  Other (See Comments)   Numbness of mouth/lips        Medication List     PAUSE taking these medications    propranolol  40 MG tablet Wait to take this until your doctor or other care provider tells you to start again. Do not restart until told to do so by PCP in follow up appointment. Commonly known as: INDERAL  Take 1 tablet (40 mg total) by mouth 2 (two) times daily.       TAKE these medications    acetaminophen  500 MG tablet Commonly known as: TYLENOL  Take 1,000 mg by mouth every 6 (six) hours as needed for mild pain (pain score 1-3) or headache.   atorvastatin  10 MG tablet Commonly known as: LIPITOR TAKE ONE TABLET BY MOUTH DAILY AT 5PM   Belsomra  15 MG Tabs Generic drug: Suvorexant  Take 1 tablet (15 mg total) by mouth at bedtime as needed.   citalopram  10 MG tablet Commonly known as: CELEXA  Take 10 mg by mouth every morning.   dicyclomine  10 MG  capsule Commonly known as: BENTYL  Take 1 capsule (10 mg total) by mouth 2 (two) times daily as needed (intestinal spasms).   divalproex  125 MG DR tablet Commonly known as: Depakote  Take oe tab at night   DULoxetine  60 MG capsule Commonly known as: CYMBALTA  Take 60 mg by mouth daily.   fluticasone  50 MCG/ACT nasal spray Commonly known as: FLONASE  Place 2 sprays into both nostrils daily.   folic acid  1 MG tablet Commonly known as: FOLVITE  TAKE ONE TABLET BY MOUTH DAILY AT 9AM   furosemide  20 MG tablet Commonly known as: LASIX  Take 1 tablet (20 mg total) by mouth daily as needed for fluid or edema. What changed: See the new instructions.   ipratropium 0.06 % nasal spray Commonly known as: ATROVENT Place 2 sprays into the nose 3 (three) times daily. As needed for clear watery nasal drip   linaclotide  72 MCG capsule Commonly known as: Linzess  Take 1 capsule (72 mcg total) by mouth daily before breakfast. For constipation What changed:  when to take this reasons to take this   Ozempic  (1 MG/DOSE) 4 MG/3ML Sopn Generic drug: Semaglutide  (1 MG/DOSE) INJECT 1MG   SUBCUTANEOUSLY ONCE A WEEK   rivastigmine  6 MG capsule Commonly known as: EXELON  Take 1 capsule (6 mg total) by mouth 2 (two) times daily.   telmisartan  80 MG tablet Commonly known as: MICARDIS  TAKE ONE TABLET BY MOUTH DAILY AT 9AM        Follow-up Information     Geofm Glade PARAS, MD. Schedule an appointment as soon as possible for a visit in 1 week(s).   Specialty: Internal Medicine Contact information: [address] [place] [address] [phone]                Allergies  Allergen Reactions   Sertraline Hcl Other (See Comments)    Spasms; numbness   Ace Inhibitors Cough   Codeine Palpitations   Darifenacin Hydrobromide Er Other (See Comments)    Pt states made her feel queezy & drunk   Gabapentin  Other (See Comments)    Hallucinations   Pravastatin  Other (See Comments)     myalgias   Propoxyphene Hcl Palpitations   Seroquel  [Quetiapine ] Other (See Comments)    Woke up screaming and yelling after taking it   Wellbutrin [Bupropion Hcl] Other (See Comments)    Numbness of mouth/lips    Discharge Exam: Vitals:   12/14/23 0829 12/14/23 0830  BP: 120/70 128/72  Pulse: 62 (!) 58  Resp: 14 13  Temp: 97.6 F (36.4 C)   SpO2: 99% 100%    Physical Exam Vitals and nursing note reviewed.  Constitutional:      General: She is not in acute distress.    Appearance: She is obese. She is not toxic-appearing.  HENT:     Head: Normocephalic and atraumatic.     Nose: Nose normal.   Eyes:     General: No scleral icterus.   Cardiovascular:     Rate and Rhythm: Regular rhythm. Bradycardia present.  Pulmonary:     Effort: Pulmonary effort is normal.     Breath sounds: Normal breath sounds.  Abdominal:     General: Abdomen is flat and protuberant.     Palpations: Abdomen is soft.     Tenderness: There is no abdominal tenderness. There is no guarding.   Musculoskeletal:     Comments: Non-pitting edema of both lower legs/ankles/feet  Posterior calf compartments are soft and pliable.   Skin:    General: Skin is warm and dry.     Capillary Refill: Capillary refill takes less than 2 seconds.   Neurological:     General: No focal deficit present.     Mental Status: She is alert and oriented to person, place, and time.     The results of significant diagnostics from this hospitalization (including imaging, microbiology, ancillary and laboratory) are listed below for reference.    Microbiology: Recent Results (from the past 240 hours)  Urine Culture     Status: None   Collection Time: 12/06/23  4:23 PM   Specimen: Urine  Result Value Ref Range Status   Source: URINE  Final   Status: FINAL  Final   Result:   Final    Mixed genital flora isolated. These superficial bacteria are not indicative of a urinary tract infection. No further organism  identification is warranted on this specimen. If clinically indicated, recollect clean-catch, mid-stream urine and transfer  immediately to Urine Culture Transport Tube.      Labs: Basic Metabolic Panel: Recent Labs  Lab 12/13/23 1913 12/14/23 0516  NA 138 138  K 4.6 4.2  CL 109 109  CO2 24 21*  GLUCOSE 89 78  BUN 8 6*  CREATININE 0.83 0.70  CALCIUM  9.3 9.7   Liver Function Tests: Recent Labs  Lab 12/13/23 1913 12/14/23 0516  AST 28 20  ALT 11 19  ALKPHOS 77 79  BILITOT 1.2 0.5  PROT 5.6* 5.9*  ALBUMIN 2.9* 3.0*   CBC: Recent Labs  Lab 12/13/23 1913 12/14/23 0516  WBC 10.9* 10.0  HGB 12.3 12.7  HCT 37.7 41.2  MCV 100.3* 104.8*  PLT 152 120*   CBG: Recent Labs  Lab 12/13/23 1952  GLUCAP 105*   Urinalysis    Component Value Date/Time   COLORURINE YELLOW 12/13/2023 2030   APPEARANCEUR CLOUDY (A) 12/13/2023 2030   LABSPEC 1.016 12/13/2023 2030   PHURINE 5.0 12/13/2023 2030   GLUCOSEU NEGATIVE 12/13/2023 2030   HGBUR NEGATIVE 12/13/2023 2030   BILIRUBINUR NEGATIVE 12/13/2023 2030   KETONESUR NEGATIVE 12/13/2023 2030   PROTEINUR NEGATIVE 12/13/2023 2030   NITRITE NEGATIVE 12/13/2023 2030   LEUKOCYTESUR SMALL (A) 12/13/2023 2030   Sepsis Labs Recent Labs  Lab 12/13/23 1913 12/14/23 0516  WBC 10.9* 10.0    Procedures/Studies: ECHOCARDIOGRAM COMPLETE Result Date: 12/14/2023    ECHOCARDIOGRAM REPORT   Patient Name:   SIMRIT GOHLKE [facility] Date of Exam: 12/14/2023 Medical Rec #:  [id number]        Height:       62.0 in Accession #:    [id number]       Weight:       189.6 lb Date of Birth:  06-21-1956        BSA:          1.869 m Patient Age:    68 years         BP:           120/70 mmHg Patient Gender: F                HR:           60 bpm. Exam Location:  Inpatient Procedure: 2D Echo, Cardiac Doppler and Color Doppler (Both Spectral and Color            Flow Doppler were utilized during procedure). Indications:    Syncope R55  History:        Patient has no  prior history of Echocardiogram examinations.                 Signs/Symptoms:Syncope; Risk Factors:Diabetes and Hypertension.  Sonographer:    Jayson Gaskins Referring Phys: [id number] SUBRINA SUNDIL IMPRESSIONS  1. Left ventricular ejection fraction, by estimation, is 55 to 60%. Left ventricular ejection fraction by PLAX is 58 %. The left ventricle has normal function. The left ventricle has no regional wall motion abnormalities. Left ventricular diastolic parameters are consistent with Grade I diastolic dysfunction (impaired relaxation).  2. Right ventricular systolic function is normal. The right ventricular size is normal.  3. The mitral valve is grossly normal. No evidence of mitral valve regurgitation.  4. The aortic valve is tricuspid. Aortic valve regurgitation is not visualized. Comparison(s): No prior Echocardiogram. FINDINGS  Left Ventricle: Left ventricular ejection fraction, by estimation, is 55 to 60%. Left ventricular ejection fraction by PLAX is 58 %. The left ventricle has normal function. The left ventricle has no regional wall motion abnormalities. The left ventricular internal cavity size was normal in size. There is no left ventricular hypertrophy. Left ventricular diastolic parameters are consistent with Grade I diastolic dysfunction (impaired relaxation). Indeterminate filling pressures. Right Ventricle: The right ventricular size is normal. No increase in right ventricular wall thickness. Right ventricular systolic function is normal. Left Atrium: Left atrial size was normal in size. Right Atrium: Right atrial size was normal in size. Pericardium: There is no evidence of pericardial effusion. Mitral Valve: The mitral valve is grossly normal. No evidence of mitral valve regurgitation. Tricuspid Valve: The tricuspid valve is grossly normal. Tricuspid valve regurgitation is trivial. Aortic Valve: The aortic valve is tricuspid. Aortic valve regurgitation is not visualized. Aortic valve mean gradient  measures 4.0 mmHg. Aortic valve peak gradient measures 7.5 mmHg. Aortic valve area, by VTI measures 2.55 cm. Pulmonic Valve: The pulmonic valve was grossly normal. Pulmonic valve regurgitation is trivial. Aorta: The aortic root and ascending aorta are structurally normal, with no evidence of dilitation. Venous: The inferior vena cava was not well visualized. IAS/Shunts: No atrial level shunt detected by color flow Doppler.  LEFT VENTRICLE PLAX 2D LV EF:         Left            Diastology                ventricular     LV e' medial:    7.72 cm/s  ejection        LV E/e' medial:  7.7                fraction by     LV e' lateral:   7.62 cm/s                PLAX is 58      LV E/e' lateral: 7.8                %. LVIDd:         4.00 cm LVIDs:         2.80 cm LV PW:         1.00 cm LV IVS:        0.70 cm LVOT diam:     1.90 cm LV SV:         70 LV SV Index:   37 LVOT Area:     2.84 cm  RIGHT VENTRICLE RV S prime:     16.10 cm/s TAPSE (M-mode): 3.0 cm LEFT ATRIUM           Index        RIGHT ATRIUM           Index LA Vol (A2C): 24.6 ml 13.16 ml/m  RA Area:     15.00 cm LA Vol (A4C): 24.6 ml 13.16 ml/m  RA Volume:   33.40 ml  17.87 ml/m  AORTIC VALVE AV Area (Vmax):    2.21 cm AV Area (Vmean):   2.42 cm AV Area (VTI):     2.55 cm AV Vmax:           137.00 cm/s AV Vmean:          94.200 cm/s AV VTI:            0.274 m AV Peak Grad:      7.5 mmHg AV Mean Grad:      4.0 mmHg LVOT Vmax:         107.00 cm/s LVOT Vmean:        80.500 cm/s LVOT VTI:          0.246 m LVOT/AV VTI ratio: 0.90  AORTA Ao Root diam: 2.70 cm MITRAL VALVE               TRICUSPID VALVE MV Area (PHT): 2.11 cm    TR Peak grad:   24.4 mmHg MV Decel Time: 359 msec    TR Vmax:        247.00 cm/s MV E velocity: 59.70 cm/s MV A velocity: 92.20 cm/s  SHUNTS MV E/A ratio:  0.65        Systemic VTI:  0.25 m                            Systemic Diam: 1.90 cm Vinie Maxcy MD Electronically signed by Vinie Maxcy MD Signature Date/Time:  12/14/2023/11:53:37 AM    Final    CT Head Wo Contrast Result Date: 12/13/2023 CLINICAL DATA:  Head trauma, minor (Age >= 65y) EXAM: CT HEAD WITHOUT CONTRAST TECHNIQUE: Contiguous axial images were obtained from the base of the skull through the vertex without intravenous contrast. RADIATION DOSE REDUCTION: This exam was performed according to the departmental dose-optimization program which includes automated exposure control, adjustment of the mA and/or kV according to patient size and/or use of iterative reconstruction technique. COMPARISON:  11/14/2023 FINDINGS: Brain: No evidence of acute infarction, hemorrhage, hydrocephalus, extra-axial collection  or mass lesion/mass effect. Vascular: No hyperdense vessel or unexpected calcification. Skull: Normal. Negative for fracture or focal lesion. Sinuses/Orbits: No acute finding. Postsurgical changes noted within the visualized left paranasal sinuses Other: None. IMPRESSION: 1. No acute intracranial abnormality is identified. Electronically Signed   By: Dorethia Molt M.D.   On: 12/13/2023 21:44   DG Chest Portable 1 View Result Date: 12/13/2023 CLINICAL DATA:  Syncope EXAM: PORTABLE CHEST 1 VIEW COMPARISON:  11/14/2023 FINDINGS: Mild cardiomegaly. No acute airspace disease, pleural effusion, or pneumothorax IMPRESSION: No active disease. Mild cardiomegaly. Electronically Signed   By: Luke Bun M.D.   On: 12/13/2023 20:57   CT Head Wo Contrast Result Date: 11/14/2023 CLINICAL DATA:  Confusion EXAM: CT HEAD WITHOUT CONTRAST TECHNIQUE: Contiguous axial images were obtained from the base of the skull through the vertex without intravenous contrast. RADIATION DOSE REDUCTION: This exam was performed according to the departmental dose-optimization program which includes automated exposure control, adjustment of the mA and/or kV according to patient size and/or use of iterative reconstruction technique. COMPARISON:  CT 10/18/2023 FINDINGS: Brain: No acute  territorial infarction, hemorrhage or intracranial mass. Patchy white matter hypodensity likely mild chronic small vessel ischemic change. Stable ventricle size Vascular: No hyperdense vessels.  No unexpected calcification Skull: Normal. Negative for fracture or focal lesion. Sinuses/Orbits: Mucosal thickening in the sinuses Other: None IMPRESSION: 1. No CT evidence for acute intracranial abnormality. 2. Mild chronic small vessel ischemic changes of the white matter. Electronically Signed   By: Luke Bun M.D.   On: 11/14/2023 21:08   DG Chest Port 1 View Result Date: 11/14/2023 CLINICAL DATA:  Altered mental status EXAM: PORTABLE CHEST 1 VIEW COMPARISON:  Chest x-ray 09/13/2023 FINDINGS: The heart is enlarged. Lung volumes are low likely accentuating pulmonary vascularity. No focal lung infiltrate, pleural effusion or pneumothorax. No acute fractures are seen. IMPRESSION: 1. Cardiomegaly. 2. Low lung volumes. Electronically Signed   By: Greig Pique M.D.   On: 11/14/2023 20:36    Time coordinating discharge: 55 mins  SIGNED:  Camellia Door, DO [facility] 12/14/23, 12:29 PM

## 2023-12-14 NOTE — Subjective & Objective (Signed)
 Pt seen and examined. Pt lives with dtr. Grandson stays with her as well. Pt got up from kitchen table yesterday. After she stood up, she felt weak in the legs.  Pt takes lasix  in the AM. She noticed dark urine in the evening 2 days ago. No burning with urination.  Also taking inderal  for BP.

## 2023-12-16 ENCOUNTER — Telehealth: Payer: Self-pay

## 2023-12-16 NOTE — Transitions of Care (Post Inpatient/ED Visit) (Unsigned)
   12/16/2023  Name: Janet Le MRN: [id number] DOB: 1955-09-30  Today's TOC FU Call Status: Today's TOC FU Call Status:: Unsuccessful Call (1st Attempt) Unsuccessful Call (1st Attempt) Date: 12/16/23  Attempted to reach the patient regarding the most recent Inpatient/ED visit.  Follow Up Plan: Additional outreach attempts will be made to reach the patient to complete the Transitions of Care (Post Inpatient/ED visit) call.   Signature  Avelina Essex, CMA (AAMA)  [facility] [phone]

## 2023-12-18 ENCOUNTER — Telehealth: Payer: Self-pay | Admitting: Internal Medicine

## 2023-12-18 NOTE — Telephone Encounter (Signed)
 Copied from CRM [id number]. Topic: Clinical - Medical Advice >> Dec 18, 2023  2:17 PM Shereese L wrote: Reason for CRM: shantia called from [facility] to advise that they will be doing an assessment on 06/30

## 2023-12-22 DIAGNOSIS — F411 Generalized anxiety disorder: Secondary | ICD-10-CM | POA: Diagnosis not present

## 2023-12-22 DIAGNOSIS — F0284 Dementia in other diseases classified elsewhere, unspecified severity, with anxiety: Secondary | ICD-10-CM | POA: Diagnosis not present

## 2023-12-22 DIAGNOSIS — I10 Essential (primary) hypertension: Secondary | ICD-10-CM | POA: Diagnosis not present

## 2023-12-22 DIAGNOSIS — G4700 Insomnia, unspecified: Secondary | ICD-10-CM | POA: Diagnosis not present

## 2023-12-22 DIAGNOSIS — F0283 Dementia in other diseases classified elsewhere, unspecified severity, with mood disturbance: Secondary | ICD-10-CM | POA: Diagnosis not present

## 2023-12-22 DIAGNOSIS — G3183 Dementia with Lewy bodies: Secondary | ICD-10-CM | POA: Diagnosis not present

## 2023-12-22 DIAGNOSIS — F02818 Dementia in other diseases classified elsewhere, unspecified severity, with other behavioral disturbance: Secondary | ICD-10-CM | POA: Diagnosis not present

## 2023-12-22 DIAGNOSIS — F329 Major depressive disorder, single episode, unspecified: Secondary | ICD-10-CM | POA: Diagnosis not present

## 2023-12-22 DIAGNOSIS — R001 Bradycardia, unspecified: Secondary | ICD-10-CM | POA: Diagnosis not present

## 2023-12-23 NOTE — Progress Notes (Unsigned)
 Subjective:    Patient ID: Janet Le, female    DOB: February 13, 1956, 68 y.o.   MRN: [id number]     HPI [name] is here for follow up from the hospital  Arrived to ED 6/20 with CC: Near syncope  She had a fall from standing-she states that her legs buckled and she fell to the ground.  Her daughter helped her up.  There were no injuries including head injury, back injury or extremity injury.  She denied any headache, chest pain, shortness of breath, syncope, new focal weakness or dizziness.  She states the leg buckling is a recurrent issue.  Her granddaughter did witness  -there was no seizure-like activity.  She felt fine in the ED.  CT of the head was negative.  EKG with sinus bradycardia at 29.  Propranolol  held.  Medications and allergies reviewed with patient and updated if appropriate.  Current Outpatient Medications on File Prior to Visit  Medication Sig Dispense Refill   acetaminophen  (TYLENOL ) 500 MG tablet Take 1,000 mg by mouth every 6 (six) hours as needed for mild pain (pain score 1-3) or headache.     atorvastatin  (LIPITOR) 10 MG tablet TAKE ONE TABLET BY MOUTH DAILY AT 5PM 90 tablet 11   citalopram  (CELEXA ) 10 MG tablet Take 10 mg by mouth every morning.     dicyclomine  (BENTYL ) 10 MG capsule Take 1 capsule (10 mg total) by mouth 2 (two) times daily as needed (intestinal spasms). 90 capsule 0   divalproex  (DEPAKOTE ) 125 MG DR tablet Take oe tab at night (Patient not taking: Reported on 12/13/2023) 30 tablet 3   DULoxetine  (CYMBALTA ) 60 MG capsule Take 60 mg by mouth daily. (Patient not taking: Reported on 12/13/2023)     fluticasone  (FLONASE ) 50 MCG/ACT nasal spray Place 2 sprays into both nostrils daily. 16 g 11   folic acid  (FOLVITE ) 1 MG tablet TAKE ONE TABLET BY MOUTH DAILY AT 9AM 240 tablet 11   furosemide  (LASIX ) 20 MG tablet Take 1 tablet (20 mg total) by mouth daily as needed for fluid or edema.     ipratropium (ATROVENT) 0.06 % nasal spray Place 2 sprays  into the nose 3 (three) times daily. As needed for clear watery nasal drip     linaclotide  (LINZESS ) 72 MCG capsule Take 1 capsule (72 mcg total) by mouth daily before breakfast. For constipation (Patient taking differently: Take 72 mcg by mouth daily as needed. For constipation) 90 capsule 1   OZEMPIC , 1 MG/DOSE, 4 MG/3ML SOPN INJECT 1MG   SUBCUTANEOUSLY ONCE A WEEK 3 mL 0   [Paused] propranolol  (INDERAL ) 40 MG tablet Take 1 tablet (40 mg total) by mouth 2 (two) times daily. 180 tablet 1   rivastigmine  (EXELON ) 6 MG capsule Take 1 capsule (6 mg total) by mouth 2 (two) times daily. 60 capsule 5   Suvorexant  (BELSOMRA ) 15 MG TABS Take 1 tablet (15 mg total) by mouth at bedtime as needed. (Patient not taking: Reported on 12/13/2023) 30 tablet 0   telmisartan  (MICARDIS ) 80 MG tablet TAKE ONE TABLET BY MOUTH DAILY AT 9AM 60 tablet 11   No current facility-administered medications on file prior to visit.     Review of Systems     Objective:  There were no vitals filed for this visit. BP Readings from Last 3 Encounters:  12/14/23 128/72  12/06/23 110/72  11/15/23 (!) 108/52   Wt Readings from Last 3 Encounters:  12/13/23 189 lb 9.5 oz (86 kg)  12/06/23 190 lb 12.8 oz (86.5 kg)  11/14/23 196 lb (88.9 kg)   There is no height or weight on file to calculate BMI.    Physical Exam     Lab Results  Component Value Date   WBC 10.0 12/14/2023   HGB 12.7 12/14/2023   HCT 41.2 12/14/2023   PLT 120 (L) 12/14/2023   GLUCOSE 78 12/14/2023   CHOL 150 07/29/2023   TRIG 110.0 07/29/2023   HDL 49.60 07/29/2023   LDLDIRECT 143.8 07/01/2012   LDLCALC 79 07/29/2023   ALT 19 12/14/2023   AST 20 12/14/2023   NA 138 12/14/2023   K 4.2 12/14/2023   CL 109 12/14/2023   CREATININE 0.70 12/14/2023   BUN 6 (L) 12/14/2023   CO2 21 (L) 12/14/2023   TSH 1.819 09/13/2023   INR 0.91 02/08/2011   HGBA1C 5.4 07/29/2023     Assessment & Plan:    See Problem List for Assessment and Plan of chronic  medical problems.

## 2023-12-24 ENCOUNTER — Ambulatory Visit (INDEPENDENT_AMBULATORY_CARE_PROVIDER_SITE_OTHER): Admitting: Internal Medicine

## 2023-12-24 ENCOUNTER — Encounter: Payer: Self-pay | Admitting: Internal Medicine

## 2023-12-24 VITALS — BP 128/60 | HR 79 | Temp 98.0°F | Ht 62.0 in | Wt 190.0 lb

## 2023-12-24 DIAGNOSIS — I10 Essential (primary) hypertension: Secondary | ICD-10-CM | POA: Diagnosis not present

## 2023-12-24 DIAGNOSIS — R55 Syncope and collapse: Secondary | ICD-10-CM | POA: Diagnosis not present

## 2023-12-24 DIAGNOSIS — Z7985 Long-term (current) use of injectable non-insulin antidiabetic drugs: Secondary | ICD-10-CM

## 2023-12-24 DIAGNOSIS — K5909 Other constipation: Secondary | ICD-10-CM

## 2023-12-24 DIAGNOSIS — R001 Bradycardia, unspecified: Secondary | ICD-10-CM | POA: Diagnosis not present

## 2023-12-24 DIAGNOSIS — R21 Rash and other nonspecific skin eruption: Secondary | ICD-10-CM | POA: Diagnosis not present

## 2023-12-24 DIAGNOSIS — E119 Type 2 diabetes mellitus without complications: Secondary | ICD-10-CM | POA: Diagnosis not present

## 2023-12-24 MED ORDER — LUBIPROSTONE 24 MCG PO CAPS: 24.0000 ug | ORAL_CAPSULE | Freq: Two times a day (BID) | ORAL | 5 refills | Status: AC

## 2023-12-24 MED ORDER — TELMISARTAN 40 MG PO TABS: 40.0000 mg | ORAL_TABLET | Freq: Every day | ORAL | 1 refills | Status: DC

## 2023-12-24 MED ORDER — TRIAMCINOLONE ACETONIDE 0.1 % EX CREA: 1.0000 | TOPICAL_CREAM | Freq: Two times a day (BID) | CUTANEOUS | 0 refills | Status: DC

## 2023-12-24 MED ORDER — BLOOD GLUCOSE MONITORING SUPPL DEVI: 0 refills | Status: DC

## 2023-12-24 MED ORDER — BLOOD GLUCOSE TEST VI STRP: ORAL_STRIP | 3 refills | Status: DC

## 2023-12-24 MED ORDER — LANCET DEVICE MISC: 0 refills | Status: DC

## 2023-12-24 MED ORDER — LANCETS MISC. MISC: 3 refills | Status: DC

## 2023-12-24 NOTE — Assessment & Plan Note (Signed)
 Had an episode of sinus bradycardia while in the hospital Propranolol  was held-heart rate is good here today Will discontinue propranolol  because of low heart rate in the hospital and because I think she is having hypotensive episodes Advised to monitor BP and heart rate

## 2023-12-24 NOTE — Assessment & Plan Note (Signed)
 Subacute She has a few lesions on the top of her head and a few lesions in the central upper back They are itchy and she does scratch them, which prevents healing-stressed not to scratch this Not spreading Triamcinolone  cream has helped-will renew If not resolving can refer to dermatology

## 2023-12-24 NOTE — Assessment & Plan Note (Signed)
 No loss of consciousness-so near syncope, which is likely related to hypotension She does have some shaking movements and her granddaughter thought her eyes rolled back in her head No obvious confusion and I do think this was all related to hypotension Medications adjusted for her blood pressure Monitor BP Discussed episodes with neurology Follow-up if she continues to have episodes

## 2023-12-24 NOTE — Assessment & Plan Note (Signed)
 Chronic Constipation not currently controlled with Linzess -that seems to be too effective Trial of Amitiza 24 mcg twice daily, which has been effective for family members and may be effective for her

## 2023-12-24 NOTE — Assessment & Plan Note (Signed)
 Chronic Lab Results  Component Value Date   HGBA1C 5.4 07/29/2023   Sugars have been controlled  Continue Ozempic  1 mg weekly-would not advise increasing the medication Encouraged regular exercise

## 2023-12-24 NOTE — Patient Instructions (Addendum)
       Medications changes include :   stop propranolol .  Decreased telmisartan  to 40 mg daily, amitiza 24 mcg twice daily. Triamcinolone  cream for rash.   Glucometer sent to [facility].      Return in about 4 months (around 04/25/2024) for follow up.

## 2023-12-24 NOTE — Assessment & Plan Note (Signed)
 Chronic Blood pressure on low side and is having episodes of orthostatic hypotension which is causing her episodes of shaking-I do not think that is a seizure Stop propranolol -currently being held Decrease telmisartan  to 40 mg daily Monitor BP

## 2023-12-26 DIAGNOSIS — F0283 Dementia in other diseases classified elsewhere, unspecified severity, with mood disturbance: Secondary | ICD-10-CM | POA: Diagnosis not present

## 2023-12-26 DIAGNOSIS — F329 Major depressive disorder, single episode, unspecified: Secondary | ICD-10-CM | POA: Diagnosis not present

## 2023-12-26 DIAGNOSIS — I10 Essential (primary) hypertension: Secondary | ICD-10-CM | POA: Diagnosis not present

## 2023-12-26 DIAGNOSIS — F0284 Dementia in other diseases classified elsewhere, unspecified severity, with anxiety: Secondary | ICD-10-CM | POA: Diagnosis not present

## 2023-12-26 DIAGNOSIS — G4700 Insomnia, unspecified: Secondary | ICD-10-CM | POA: Diagnosis not present

## 2023-12-26 DIAGNOSIS — F02818 Dementia in other diseases classified elsewhere, unspecified severity, with other behavioral disturbance: Secondary | ICD-10-CM | POA: Diagnosis not present

## 2023-12-26 DIAGNOSIS — F411 Generalized anxiety disorder: Secondary | ICD-10-CM | POA: Diagnosis not present

## 2023-12-26 DIAGNOSIS — G3183 Dementia with Lewy bodies: Secondary | ICD-10-CM | POA: Diagnosis not present

## 2023-12-26 DIAGNOSIS — R001 Bradycardia, unspecified: Secondary | ICD-10-CM | POA: Diagnosis not present

## 2023-12-31 ENCOUNTER — Other Ambulatory Visit: Payer: Self-pay | Admitting: Internal Medicine

## 2023-12-31 DIAGNOSIS — E119 Type 2 diabetes mellitus without complications: Secondary | ICD-10-CM

## 2024-01-02 DIAGNOSIS — F0283 Dementia in other diseases classified elsewhere, unspecified severity, with mood disturbance: Secondary | ICD-10-CM | POA: Diagnosis not present

## 2024-01-02 DIAGNOSIS — G3183 Dementia with Lewy bodies: Secondary | ICD-10-CM | POA: Diagnosis not present

## 2024-01-02 DIAGNOSIS — R001 Bradycardia, unspecified: Secondary | ICD-10-CM | POA: Diagnosis not present

## 2024-01-02 DIAGNOSIS — F329 Major depressive disorder, single episode, unspecified: Secondary | ICD-10-CM | POA: Diagnosis not present

## 2024-01-02 DIAGNOSIS — F411 Generalized anxiety disorder: Secondary | ICD-10-CM | POA: Diagnosis not present

## 2024-01-02 DIAGNOSIS — F0284 Dementia in other diseases classified elsewhere, unspecified severity, with anxiety: Secondary | ICD-10-CM | POA: Diagnosis not present

## 2024-01-02 DIAGNOSIS — F02818 Dementia in other diseases classified elsewhere, unspecified severity, with other behavioral disturbance: Secondary | ICD-10-CM | POA: Diagnosis not present

## 2024-01-02 DIAGNOSIS — G4700 Insomnia, unspecified: Secondary | ICD-10-CM | POA: Diagnosis not present

## 2024-01-02 DIAGNOSIS — I10 Essential (primary) hypertension: Secondary | ICD-10-CM | POA: Diagnosis not present

## 2024-01-06 DIAGNOSIS — F0282 Dementia in other diseases classified elsewhere, unspecified severity, with psychotic disturbance: Secondary | ICD-10-CM

## 2024-01-06 DIAGNOSIS — F02818 Dementia in other diseases classified elsewhere, unspecified severity, with other behavioral disturbance: Secondary | ICD-10-CM | POA: Diagnosis not present

## 2024-01-06 DIAGNOSIS — G4700 Insomnia, unspecified: Secondary | ICD-10-CM | POA: Diagnosis not present

## 2024-01-06 DIAGNOSIS — I10 Essential (primary) hypertension: Secondary | ICD-10-CM | POA: Diagnosis not present

## 2024-01-06 DIAGNOSIS — F411 Generalized anxiety disorder: Secondary | ICD-10-CM | POA: Diagnosis not present

## 2024-01-06 DIAGNOSIS — F329 Major depressive disorder, single episode, unspecified: Secondary | ICD-10-CM | POA: Diagnosis not present

## 2024-01-06 DIAGNOSIS — F0284 Dementia in other diseases classified elsewhere, unspecified severity, with anxiety: Secondary | ICD-10-CM | POA: Diagnosis not present

## 2024-01-06 DIAGNOSIS — G4752 REM sleep behavior disorder: Secondary | ICD-10-CM

## 2024-01-06 DIAGNOSIS — R001 Bradycardia, unspecified: Secondary | ICD-10-CM | POA: Diagnosis not present

## 2024-01-06 DIAGNOSIS — G3183 Dementia with Lewy bodies: Secondary | ICD-10-CM | POA: Diagnosis not present

## 2024-01-06 DIAGNOSIS — F0283 Dementia in other diseases classified elsewhere, unspecified severity, with mood disturbance: Secondary | ICD-10-CM | POA: Diagnosis not present

## 2024-01-06 DIAGNOSIS — E1140 Type 2 diabetes mellitus with diabetic neuropathy, unspecified: Secondary | ICD-10-CM

## 2024-01-07 DIAGNOSIS — F329 Major depressive disorder, single episode, unspecified: Secondary | ICD-10-CM | POA: Diagnosis not present

## 2024-01-07 DIAGNOSIS — I10 Essential (primary) hypertension: Secondary | ICD-10-CM | POA: Diagnosis not present

## 2024-01-07 DIAGNOSIS — F02818 Dementia in other diseases classified elsewhere, unspecified severity, with other behavioral disturbance: Secondary | ICD-10-CM | POA: Diagnosis not present

## 2024-01-07 DIAGNOSIS — F411 Generalized anxiety disorder: Secondary | ICD-10-CM | POA: Diagnosis not present

## 2024-01-07 DIAGNOSIS — F0284 Dementia in other diseases classified elsewhere, unspecified severity, with anxiety: Secondary | ICD-10-CM | POA: Diagnosis not present

## 2024-01-07 DIAGNOSIS — F0283 Dementia in other diseases classified elsewhere, unspecified severity, with mood disturbance: Secondary | ICD-10-CM | POA: Diagnosis not present

## 2024-01-07 DIAGNOSIS — G3183 Dementia with Lewy bodies: Secondary | ICD-10-CM | POA: Diagnosis not present

## 2024-01-07 DIAGNOSIS — R001 Bradycardia, unspecified: Secondary | ICD-10-CM | POA: Diagnosis not present

## 2024-01-07 DIAGNOSIS — G4700 Insomnia, unspecified: Secondary | ICD-10-CM | POA: Diagnosis not present

## 2024-01-10 ENCOUNTER — Encounter: Payer: Self-pay | Admitting: Advanced Practice Midwife

## 2024-01-10 DIAGNOSIS — R001 Bradycardia, unspecified: Secondary | ICD-10-CM | POA: Diagnosis not present

## 2024-01-10 DIAGNOSIS — F02818 Dementia in other diseases classified elsewhere, unspecified severity, with other behavioral disturbance: Secondary | ICD-10-CM | POA: Diagnosis not present

## 2024-01-10 DIAGNOSIS — G3183 Dementia with Lewy bodies: Secondary | ICD-10-CM | POA: Diagnosis not present

## 2024-01-10 DIAGNOSIS — F0283 Dementia in other diseases classified elsewhere, unspecified severity, with mood disturbance: Secondary | ICD-10-CM | POA: Diagnosis not present

## 2024-01-10 DIAGNOSIS — F329 Major depressive disorder, single episode, unspecified: Secondary | ICD-10-CM | POA: Diagnosis not present

## 2024-01-10 DIAGNOSIS — I10 Essential (primary) hypertension: Secondary | ICD-10-CM | POA: Diagnosis not present

## 2024-01-10 DIAGNOSIS — F0284 Dementia in other diseases classified elsewhere, unspecified severity, with anxiety: Secondary | ICD-10-CM | POA: Diagnosis not present

## 2024-01-10 DIAGNOSIS — G4700 Insomnia, unspecified: Secondary | ICD-10-CM | POA: Diagnosis not present

## 2024-01-10 DIAGNOSIS — F411 Generalized anxiety disorder: Secondary | ICD-10-CM | POA: Diagnosis not present

## 2024-01-13 DIAGNOSIS — F0284 Dementia in other diseases classified elsewhere, unspecified severity, with anxiety: Secondary | ICD-10-CM | POA: Diagnosis not present

## 2024-01-13 DIAGNOSIS — I10 Essential (primary) hypertension: Secondary | ICD-10-CM | POA: Diagnosis not present

## 2024-01-13 DIAGNOSIS — F411 Generalized anxiety disorder: Secondary | ICD-10-CM | POA: Diagnosis not present

## 2024-01-13 DIAGNOSIS — G4700 Insomnia, unspecified: Secondary | ICD-10-CM | POA: Diagnosis not present

## 2024-01-13 DIAGNOSIS — F0283 Dementia in other diseases classified elsewhere, unspecified severity, with mood disturbance: Secondary | ICD-10-CM | POA: Diagnosis not present

## 2024-01-13 DIAGNOSIS — F02818 Dementia in other diseases classified elsewhere, unspecified severity, with other behavioral disturbance: Secondary | ICD-10-CM | POA: Diagnosis not present

## 2024-01-13 DIAGNOSIS — R001 Bradycardia, unspecified: Secondary | ICD-10-CM | POA: Diagnosis not present

## 2024-01-13 DIAGNOSIS — G3183 Dementia with Lewy bodies: Secondary | ICD-10-CM | POA: Diagnosis not present

## 2024-01-13 DIAGNOSIS — F329 Major depressive disorder, single episode, unspecified: Secondary | ICD-10-CM | POA: Diagnosis not present

## 2024-01-14 ENCOUNTER — Telehealth: Payer: Self-pay

## 2024-01-14 NOTE — Telephone Encounter (Unsigned)
 Copied from CRM [id number]. Topic: Clinical - Home Health Verbal Orders >> Jan 14, 2024  4:33 PM Lavanda D wrote: Caller/Agency: Lorrene with [facility] Callback Number: [phone] Service Requested: Physical Therapy Frequency: 2x a week for 1 week, 1x a week for 2 weeks. Any new concerns about the patient? No

## 2024-01-16 ENCOUNTER — Other Ambulatory Visit: Payer: Self-pay

## 2024-01-16 ENCOUNTER — Telehealth: Payer: Self-pay | Admitting: Internal Medicine

## 2024-01-16 NOTE — Telephone Encounter (Signed)
 Copied from CRM [id number]. Topic: Clinical - Order For Equipment >> Jan 16, 2024  8:18 AM Antwanette L wrote: Reason for CRM: Patient daughter(Melissa) is calling to see if Dr. Geofm can order her another glucose monitor. The patient misplaced her. [facility] said they just need another from Dr. Geofm. Melissa can be contacted at [phone].  Pharmacy Details [facility] - [place] ([place]), [place] - [address] [place] ([place]) [place] [address] Phone: [phone] Fax: [phone]

## 2024-01-16 NOTE — Telephone Encounter (Signed)
Verbals left today. 

## 2024-01-17 DIAGNOSIS — G3183 Dementia with Lewy bodies: Secondary | ICD-10-CM | POA: Diagnosis not present

## 2024-01-17 DIAGNOSIS — F0284 Dementia in other diseases classified elsewhere, unspecified severity, with anxiety: Secondary | ICD-10-CM | POA: Diagnosis not present

## 2024-01-17 DIAGNOSIS — R001 Bradycardia, unspecified: Secondary | ICD-10-CM | POA: Diagnosis not present

## 2024-01-17 DIAGNOSIS — G4700 Insomnia, unspecified: Secondary | ICD-10-CM | POA: Diagnosis not present

## 2024-01-17 DIAGNOSIS — F411 Generalized anxiety disorder: Secondary | ICD-10-CM | POA: Diagnosis not present

## 2024-01-17 DIAGNOSIS — I10 Essential (primary) hypertension: Secondary | ICD-10-CM | POA: Diagnosis not present

## 2024-01-17 DIAGNOSIS — F0283 Dementia in other diseases classified elsewhere, unspecified severity, with mood disturbance: Secondary | ICD-10-CM | POA: Diagnosis not present

## 2024-01-17 DIAGNOSIS — F329 Major depressive disorder, single episode, unspecified: Secondary | ICD-10-CM | POA: Diagnosis not present

## 2024-01-17 DIAGNOSIS — F02818 Dementia in other diseases classified elsewhere, unspecified severity, with other behavioral disturbance: Secondary | ICD-10-CM | POA: Diagnosis not present

## 2024-01-20 ENCOUNTER — Other Ambulatory Visit: Payer: Self-pay

## 2024-01-20 ENCOUNTER — Other Ambulatory Visit: Payer: Self-pay | Admitting: Internal Medicine

## 2024-01-20 ENCOUNTER — Telehealth: Payer: Self-pay | Admitting: Pharmacist

## 2024-01-20 DIAGNOSIS — E119 Type 2 diabetes mellitus without complications: Secondary | ICD-10-CM

## 2024-01-20 MED ORDER — BLOOD GLUCOSE MONITOR KIT: PACK | 0 refills | Status: AC

## 2024-01-20 NOTE — Telephone Encounter (Signed)
Script faxed today 

## 2024-01-20 NOTE — Progress Notes (Signed)
 Pharmacy Quality Measure Review  This patient is appearing on a report for being at risk of failing the adherence measure for diabetes medications this calendar year.   Medication: Ozempic  Last fill date: 12/31/23 for 28 day supply  Insurance report was not up to date. No action needed at this time.   Darrelyn Drum, PharmD, BCPS, CPP Clinical Pharmacist Practitioner [facility] [phone]

## 2024-01-21 ENCOUNTER — Other Ambulatory Visit: Payer: Self-pay | Admitting: Neurology

## 2024-01-22 DIAGNOSIS — F0283 Dementia in other diseases classified elsewhere, unspecified severity, with mood disturbance: Secondary | ICD-10-CM | POA: Diagnosis not present

## 2024-01-22 DIAGNOSIS — F02818 Dementia in other diseases classified elsewhere, unspecified severity, with other behavioral disturbance: Secondary | ICD-10-CM | POA: Diagnosis not present

## 2024-01-22 DIAGNOSIS — F329 Major depressive disorder, single episode, unspecified: Secondary | ICD-10-CM | POA: Diagnosis not present

## 2024-01-22 DIAGNOSIS — G3183 Dementia with Lewy bodies: Secondary | ICD-10-CM | POA: Diagnosis not present

## 2024-01-22 DIAGNOSIS — G4700 Insomnia, unspecified: Secondary | ICD-10-CM | POA: Diagnosis not present

## 2024-01-22 DIAGNOSIS — F411 Generalized anxiety disorder: Secondary | ICD-10-CM | POA: Diagnosis not present

## 2024-01-22 DIAGNOSIS — R001 Bradycardia, unspecified: Secondary | ICD-10-CM | POA: Diagnosis not present

## 2024-01-22 DIAGNOSIS — F0284 Dementia in other diseases classified elsewhere, unspecified severity, with anxiety: Secondary | ICD-10-CM | POA: Diagnosis not present

## 2024-01-22 DIAGNOSIS — I10 Essential (primary) hypertension: Secondary | ICD-10-CM | POA: Diagnosis not present

## 2024-01-24 ENCOUNTER — Ambulatory Visit: Admitting: Internal Medicine

## 2024-01-27 ENCOUNTER — Encounter: Payer: Self-pay | Admitting: Family Medicine

## 2024-01-27 ENCOUNTER — Ambulatory Visit (INDEPENDENT_AMBULATORY_CARE_PROVIDER_SITE_OTHER): Admitting: Family Medicine

## 2024-01-27 VITALS — BP 100/60 | HR 88 | Temp 97.6°F | Resp 18 | Ht 62.0 in | Wt 182.0 lb

## 2024-01-27 DIAGNOSIS — N390 Urinary tract infection, site not specified: Secondary | ICD-10-CM

## 2024-01-27 DIAGNOSIS — I10 Essential (primary) hypertension: Secondary | ICD-10-CM | POA: Diagnosis not present

## 2024-01-27 DIAGNOSIS — N3001 Acute cystitis with hematuria: Secondary | ICD-10-CM

## 2024-01-27 DIAGNOSIS — E119 Type 2 diabetes mellitus without complications: Secondary | ICD-10-CM | POA: Diagnosis not present

## 2024-01-27 LAB — POCT URINALYSIS DIPSTICK
Bilirubin, UA: POSITIVE
Blood, UA: POSITIVE
Glucose, UA: NEGATIVE
Ketones, UA: NEGATIVE
Nitrite, UA: NEGATIVE
Protein, UA: NEGATIVE
Spec Grav, UA: 1.015 (ref 1.010–1.025)
Urobilinogen, UA: NEGATIVE U/dL — AB
pH, UA: 5 (ref 5.0–8.0)

## 2024-01-27 MED ORDER — BLOOD GLUCOSE MONITORING SUPPL DEVI: 1.0000 | Freq: Three times a day (TID) | 0 refills | Status: AC

## 2024-01-27 MED ORDER — LANCET DEVICE MISC: 1.0000 | Freq: Three times a day (TID) | 0 refills | Status: AC

## 2024-01-27 MED ORDER — CEFDINIR 300 MG PO CAPS: 300.0000 mg | ORAL_CAPSULE | Freq: Two times a day (BID) | ORAL | 0 refills | Status: AC

## 2024-01-27 MED ORDER — BLOOD GLUCOSE TEST VI STRP: 1.0000 | ORAL_STRIP | Freq: Three times a day (TID) | 0 refills | Status: AC

## 2024-01-27 MED ORDER — LANCETS MISC. MISC: 1.0000 | Freq: Three times a day (TID) | 0 refills | Status: AC

## 2024-01-27 NOTE — Assessment & Plan Note (Signed)
 Education provided on UTIs. Encouraged adequate hydration.  - POCT urinalysis dipstick - Urine Culture; Future - cefdinir  (OMNICEF ) 300 MG capsule; Take 1 capsule (300 mg total) by mouth 2 (two) times daily for 7 days.  Dispense: 14 capsule; Refill: 0

## 2024-01-27 NOTE — Assessment & Plan Note (Signed)
 Order for glucometer and supplies prescribed.

## 2024-01-27 NOTE — Assessment & Plan Note (Signed)
 Orthostatic hypotension with significant blood pressure drop from lying to sitting, contributing to dizziness and fall risk. Weight loss may reduce need for antihypertensives. - Discontinue telmisartan  due to low blood pressure and orthostatic hypotension. - Advise slow positional changes to prevent dizziness and falls. - Encourage home blood pressure monitoring daily or every other day.

## 2024-01-27 NOTE — Patient Instructions (Signed)
 Discontinue Telmisartan  due to low blood pressures.

## 2024-01-27 NOTE — Progress Notes (Signed)
 Assessment & Plan:   Assessment & Plan Essential hypertension Orthostatic hypotension with significant blood pressure drop from lying to sitting, contributing to dizziness and fall risk. Weight loss may reduce need for antihypertensives. - Discontinue telmisartan  due to low blood pressure and orthostatic hypotension. - Advise slow positional changes to prevent dizziness and falls. - Encourage home blood pressure monitoring daily or every other day. Acute cystitis with hematuria Education provided on UTIs. Encouraged adequate hydration.  - POCT urinalysis dipstick - Urine Culture; Future - cefdinir  (OMNICEF ) 300 MG capsule; Take 1 capsule (300 mg total) by mouth 2 (two) times daily for 7 days.  Dispense: 14 capsule; Refill: 0 Recurrent UTI - Ambulatory referral to Urology Non-insulin  dependent type 2 diabetes mellitus (HCC) Order for glucometer and supplies prescribed.    Follow up plan: Return in about 2 weeks (around 02/10/2024) for BP w. PCP.  Niki Rung, MSN, APRN, FNP-C  Subjective:  HPI: Janet Le is a 68 y.o. female presenting on 01/27/2024 for Insomnia (On going problem - about the same , has not had meds in awhile) and Loss of Consciousness (vagal response when on toilet - feels light headed and weak /This happens also other times - est falls with this sensation is 5-6 times (no injury, only soreness) )  Discussed the use of AI scribe software for clinical note transcription with the patient, who gave verbal consent to proceed.  History of Present Illness   Janet Le is a 68 year old female with a history of falls and low blood pressure who presents with dizziness and confusion. She is accompanied by her daughter, Janet Le.  She experiences dizziness and episodes of fainting, which have been ongoing for some time. Her blood pressure has been low, and she has had falls in the past, including one incident where she slid off the toilet. During a  previous hospital visit, her blood pressure was low, leading to adjustments in her medication regimen. Her medication regimen was adjusted by discontinuing propranolol  and reducing telmisartan  from 80 mg to 40 mg. Despite these changes, she continues to experience dizziness, although she has not had any recent falls. She takes furosemide  as needed for swelling, particularly when her ankles swell, and has been taking it daily for the past two weeks.  She has been experiencing confusion, which her daughter has noticed, including incidents like misplacing items and putting trash in the dog's food. Her daughter also notes that she has been more confused since staying at her house. She has a history of recurrent urinary tract infections.       ROS: Negative unless specifically indicated above in HPI.   Relevant past medical history reviewed and updated as indicated.   Allergies and medications reviewed and updated.   Current Outpatient Medications:    acetaminophen  (TYLENOL ) 500 MG tablet, Take 1,000 mg by mouth every 6 (six) hours as needed for mild pain (pain score 1-3) or headache., Disp: , Rfl:    atorvastatin  (LIPITOR) 10 MG tablet, TAKE ONE TABLET BY MOUTH DAILY AT 5PM, Disp: 90 tablet, Rfl: 11   citalopram  (CELEXA ) 10 MG tablet, TAKE ONE TABLET (10MG  TOTAL) BY MOUTH DAILY AT 9AM, Disp: 30 tablet, Rfl: 5   dicyclomine  (BENTYL ) 10 MG capsule, Take 1 capsule (10 mg total) by mouth 2 (two) times daily as needed (intestinal spasms)., Disp: 90 capsule, Rfl: 0   fluticasone  (FLONASE ) 50 MCG/ACT nasal spray, Place 2 sprays into both nostrils daily., Disp: 16 g, Rfl: 11  folic acid  (FOLVITE ) 1 MG tablet, TAKE ONE TABLET BY MOUTH DAILY AT 9AM, Disp: 240 tablet, Rfl: 11   furosemide  (LASIX ) 20 MG tablet, Take 1 tablet (20 mg total) by mouth daily as needed for fluid or edema., Disp: , Rfl:    ipratropium (ATROVENT) 0.06 % nasal spray, Place 2 sprays into the nose 3 (three) times daily. As needed for  clear watery nasal drip, Disp: , Rfl:    lubiprostone  (AMITIZA ) 24 MCG capsule, Take 1 capsule (24 mcg total) by mouth 2 (two) times daily with a meal., Disp: 60 capsule, Rfl: 5   OZEMPIC , 1 MG/DOSE, 4 MG/3ML SOPN, INJECT 1MG   SUBCUTANEOUSLY ONCE A WEEK, Disp: 3 mL, Rfl: 0   rivastigmine  (EXELON ) 6 MG capsule, TAKE ONE CAPSULE (6 MG TOTAL) BY MOUTH TWICE DAILY @ 9AM-5PM, Disp: 60 capsule, Rfl: 11   telmisartan  (MICARDIS ) 40 MG tablet, Take 1 tablet (40 mg total) by mouth daily., Disp: 90 tablet, Rfl: 1   blood glucose meter kit and supplies KIT, Dispense based on patient and insurance preference. Use up to four times daily as directed. (FOR R73.03)., Disp: 1 each, Rfl: 0   Blood Glucose Monitoring Suppl DEVI, UAD to check sugars daily and prn.  E11.9 May substitute to any manufacturer covered by patient's insurance. (Patient not taking: Reported on 01/27/2024), Disp: 1 each, Rfl: 0   Glucose Blood (BLOOD GLUCOSE TEST STRIPS) STRP, UAD to check sugars daily and prn.  E11.9 May substitute to any manufacturer covered by patient's insurance. (Patient not taking: Reported on 01/27/2024), Disp: 100 strip, Rfl: 3   Lancet Device MISC, UAD to check sugars daily and prn.  E11.9 May substitute to any manufacturer covered by patient's insurance. (Patient not taking: Reported on 01/27/2024), Disp: 1 each, Rfl: 0   Lancets Misc. MISC, UAD to check sugars daily and prn.  E11.9 May substitute to any manufacturer covered by patient's insurance. (Patient not taking: Reported on 01/27/2024), Disp: 100 each, Rfl: 3   Suvorexant  (BELSOMRA ) 15 MG TABS, Take 1 tablet (15 mg total) by mouth at bedtime as needed. (Patient not taking: Reported on 01/27/2024), Disp: 30 tablet, Rfl: 0   triamcinolone  cream (KENALOG ) 0.1 %, Apply 1 Application topically 2 (two) times daily., Disp: 30 g, Rfl: 0  Allergies  Allergen Reactions   Sertraline Hcl Other (See Comments)    Spasms; numbness   Ace Inhibitors Cough   Codeine Palpitations    Darifenacin Hydrobromide Er Other (See Comments)    Pt states made her feel queezy & drunk   Gabapentin  Other (See Comments)    Hallucinations   Pravastatin  Other (See Comments)    myalgias   Propoxyphene Hcl Palpitations   Seroquel  [Quetiapine ] Other (See Comments)    Woke up screaming and yelling after taking it   Wellbutrin [Bupropion Hcl] Other (See Comments)    Numbness of mouth/lips    Objective:   BP 100/60   Pulse 88   Temp 97.6 F (36.4 C)   Resp 18   Ht 5' 2 (1.575 m)   Wt 182 lb (82.6 kg)   SpO2 99%   BMI 33.29 kg/m    Orthostatic Vitals for the past 48 hrs (Last 6 readings):  Patient Position Orthostatic BP Orthostatic Pulse BP Pulse  01/27/24 1100 -- -- -- 100/60 88  01/27/24 1150 Supine 110/72 72 -- --  01/27/24 1151 Sitting (!) 80/54 86 -- --  01/27/24 1152 Standing (!) 78/48 88 -- --   Physical Exam Vitals  reviewed.  Constitutional:      General: She is not in acute distress.    Appearance: Normal appearance. She is not ill-appearing, toxic-appearing or diaphoretic.  HENT:     Head: Normocephalic and atraumatic.  Eyes:     General: No scleral icterus.       Right eye: No discharge.        Left eye: No discharge.     Conjunctiva/sclera: Conjunctivae normal.  Cardiovascular:     Rate and Rhythm: Normal rate and regular rhythm.     Heart sounds: Normal heart sounds. No murmur heard.    No friction rub. No gallop.  Pulmonary:     Effort: Pulmonary effort is normal. No respiratory distress.     Breath sounds: Normal breath sounds. No stridor. No wheezing, rhonchi or rales.  Musculoskeletal:        General: Normal range of motion.     Cervical back: Normal range of motion.  Skin:    General: Skin is warm and dry.     Capillary Refill: Capillary refill takes less than 2 seconds.  Neurological:     General: No focal deficit present.     Mental Status: She is alert and oriented to person, place, and time. Mental status is at baseline.     Gait:  Gait abnormal (riding in wheelchair).  Psychiatric:        Mood and Affect: Mood normal.        Behavior: Behavior normal.        Thought Content: Thought content normal.        Cognition and Memory: She exhibits impaired recent memory.        Judgment: Judgment normal.

## 2024-01-28 ENCOUNTER — Other Ambulatory Visit: Payer: Self-pay

## 2024-01-28 ENCOUNTER — Telehealth: Payer: Self-pay | Admitting: Physician Assistant

## 2024-01-28 ENCOUNTER — Telehealth: Payer: Self-pay

## 2024-01-28 ENCOUNTER — Telehealth: Payer: Self-pay | Admitting: Neurology

## 2024-01-28 DIAGNOSIS — G3183 Dementia with Lewy bodies: Secondary | ICD-10-CM

## 2024-01-28 DIAGNOSIS — F411 Generalized anxiety disorder: Secondary | ICD-10-CM

## 2024-01-28 DIAGNOSIS — F02A3 Dementia in other diseases classified elsewhere, mild, with mood disturbance: Secondary | ICD-10-CM

## 2024-01-28 DIAGNOSIS — R4189 Other symptoms and signs involving cognitive functions and awareness: Secondary | ICD-10-CM

## 2024-01-28 LAB — URINE CULTURE

## 2024-01-28 NOTE — Telephone Encounter (Signed)
 Pt's daughter called in and left a message. She is wanting to see if Dr. Skeet could write a referral for Palliative care for her to get respite and relief for the pt's dementia.

## 2024-01-28 NOTE — Telephone Encounter (Signed)
 Copied from CRM #[id number]. Topic: Referral - Request for Referral >> Jan 28, 2024  1:53 PM Suzen RAMAN wrote: Did the patient discuss referral with their provider in the last year? No-Upcoming appt 02/11/24   (If No - schedule appointment) (If Yes - send message)  Appointment offered? No  Type of order/referral and detailed reason for visit: Pallative Care-Hospice   Preference of office, provider, location: [facility] [address] [place], [place], [place] [address]   If referral order, have you been seen by this specialty before? No (If Yes, this issue or another issue? When? Where?  Can we respond through MyChart? Yes

## 2024-01-28 NOTE — Telephone Encounter (Signed)
 Order placed for Pallative care

## 2024-01-28 NOTE — Telephone Encounter (Signed)
 Referral ordered

## 2024-01-28 NOTE — Telephone Encounter (Signed)
 Ordered placed to [facility]

## 2024-01-29 ENCOUNTER — Telehealth: Payer: Self-pay | Admitting: Internal Medicine

## 2024-01-29 DIAGNOSIS — F0284 Dementia in other diseases classified elsewhere, unspecified severity, with anxiety: Secondary | ICD-10-CM | POA: Diagnosis not present

## 2024-01-29 DIAGNOSIS — F411 Generalized anxiety disorder: Secondary | ICD-10-CM | POA: Diagnosis not present

## 2024-01-29 DIAGNOSIS — R001 Bradycardia, unspecified: Secondary | ICD-10-CM | POA: Diagnosis not present

## 2024-01-29 DIAGNOSIS — F329 Major depressive disorder, single episode, unspecified: Secondary | ICD-10-CM | POA: Diagnosis not present

## 2024-01-29 DIAGNOSIS — G4700 Insomnia, unspecified: Secondary | ICD-10-CM | POA: Diagnosis not present

## 2024-01-29 DIAGNOSIS — F0283 Dementia in other diseases classified elsewhere, unspecified severity, with mood disturbance: Secondary | ICD-10-CM | POA: Diagnosis not present

## 2024-01-29 DIAGNOSIS — G3183 Dementia with Lewy bodies: Secondary | ICD-10-CM | POA: Diagnosis not present

## 2024-01-29 DIAGNOSIS — I10 Essential (primary) hypertension: Secondary | ICD-10-CM | POA: Diagnosis not present

## 2024-01-29 DIAGNOSIS — F02818 Dementia in other diseases classified elsewhere, unspecified severity, with other behavioral disturbance: Secondary | ICD-10-CM | POA: Diagnosis not present

## 2024-01-29 NOTE — Telephone Encounter (Unsigned)
 Copied from CRM #[id number]. Topic: Referral - Status >> Jan 29, 2024  9:29 AM Gennette ORN wrote: Reason for CRM: Janet Le called from [facility], she called in due to the referral and patient got accepted. Her call back number [phone]

## 2024-01-30 NOTE — Telephone Encounter (Signed)
 Noted

## 2024-02-03 ENCOUNTER — Other Ambulatory Visit: Payer: Self-pay | Admitting: Internal Medicine

## 2024-02-04 DIAGNOSIS — G4700 Insomnia, unspecified: Secondary | ICD-10-CM | POA: Diagnosis not present

## 2024-02-04 DIAGNOSIS — F02818 Dementia in other diseases classified elsewhere, unspecified severity, with other behavioral disturbance: Secondary | ICD-10-CM | POA: Diagnosis not present

## 2024-02-04 DIAGNOSIS — R001 Bradycardia, unspecified: Secondary | ICD-10-CM | POA: Diagnosis not present

## 2024-02-04 DIAGNOSIS — F0283 Dementia in other diseases classified elsewhere, unspecified severity, with mood disturbance: Secondary | ICD-10-CM | POA: Diagnosis not present

## 2024-02-04 DIAGNOSIS — F329 Major depressive disorder, single episode, unspecified: Secondary | ICD-10-CM | POA: Diagnosis not present

## 2024-02-04 DIAGNOSIS — F411 Generalized anxiety disorder: Secondary | ICD-10-CM | POA: Diagnosis not present

## 2024-02-04 DIAGNOSIS — F0284 Dementia in other diseases classified elsewhere, unspecified severity, with anxiety: Secondary | ICD-10-CM | POA: Diagnosis not present

## 2024-02-04 DIAGNOSIS — G3183 Dementia with Lewy bodies: Secondary | ICD-10-CM | POA: Diagnosis not present

## 2024-02-04 DIAGNOSIS — I10 Essential (primary) hypertension: Secondary | ICD-10-CM | POA: Diagnosis not present

## 2024-02-04 NOTE — Telephone Encounter (Unsigned)
 Copied from CRM #[id number]. Topic: Clinical - Medication Refill >> Feb 03, 2024  4:56 PM Lavanda D wrote: Medication: Suvorexant  (BELSOMRA ) 15 MG TABS  Has the patient contacted their pharmacy? No - 0 Refills (Agent: If no, request that the patient contact the pharmacy for the refill. If patient does not wish to contact the pharmacy document the reason why and proceed with request.) (Agent: If yes, when and what did the pharmacy advise?)  This is the patient's preferred pharmacy:  [facility] - [place] ([place]), [place] - [address] [place] ([place]) [place] [address] Phone: [phone] Fax: [phone]  Is this the correct pharmacy for this prescription? Yes If no, delete pharmacy and type the correct one.   Has the prescription been filled recently? Yes  Is the patient out of the medication? Yes  Has the patient been seen for an appointment in the last year OR does the patient have an upcoming appointment? Yes  Can we respond through MyChart? Yes  Agent: Please be advised that Rx refills may take up to 3 business days. We ask that you follow-up with your pharmacy.

## 2024-02-05 ENCOUNTER — Telehealth: Payer: Self-pay | Admitting: Internal Medicine

## 2024-02-05 ENCOUNTER — Ambulatory Visit: Payer: Medicare PPO | Admitting: Neurology

## 2024-02-05 MED ORDER — BELSOMRA 15 MG PO TABS: 15.0000 mg | ORAL_TABLET | Freq: Every evening | ORAL | 0 refills | Status: DC | PRN

## 2024-02-05 NOTE — Telephone Encounter (Signed)
 Copied from CRM [id number]. Topic: Referral - Status >> Feb 05, 2024  2:25 PM Gennette ORN wrote: Reason for CRM: Patient's daughter Eleanor [phone] is calling to get an update on referrals.

## 2024-02-06 ENCOUNTER — Telehealth: Payer: Self-pay | Admitting: Internal Medicine

## 2024-02-06 NOTE — Telephone Encounter (Signed)
Form received today.

## 2024-02-06 NOTE — Telephone Encounter (Signed)
 Called Melissa and unable to leave message because her VM has not been setup.  Left message on patient's phone today to return call to clinic.  Called Palliative care and they have already gotten her set up so not sure what referrals Eleanor is inquiring about.  If she calls back please ask what referrals she is inquiring about so we know how to best help her.

## 2024-02-06 NOTE — Telephone Encounter (Signed)
 Pt daughter just dropped of a FL2 form and would like for her doctor to fill it out and would like for clinical to call her once this is completed or have any questions regarding the request. Please advise, Thanks

## 2024-02-08 ENCOUNTER — Other Ambulatory Visit: Payer: Self-pay | Admitting: Internal Medicine

## 2024-02-10 ENCOUNTER — Encounter: Payer: Self-pay | Admitting: Internal Medicine

## 2024-02-10 NOTE — Patient Instructions (Incomplete)
      Blood work was ordered.       Medications changes include :   None    A referral was ordered and someone will call you to schedule an appointment.     No follow-ups on file.

## 2024-02-10 NOTE — Progress Notes (Deleted)
 Subjective:    Patient ID: Janet Le, female    DOB: [birth year], 68 y.o.   MRN: [id number]     HPI Janet Le is here for follow up of her chronic medical problems.  Was seen 8/4 for insomnia, vasovagal syncope  Hypertension, orthostatic hypotension Significant drop in BP from the laying to sitting contributing to dizziness.  Telmisartan  discontinued.  Advised precautions for changing positions.  Advised to monitor BP  Acute cystitis with hematuria-started on cefdinir  twice daily x 7 days.  Urine culture was negative.  Referral was ordered for urology.    Medications and allergies reviewed with patient and updated if appropriate.  Current Outpatient Medications on File Prior to Visit  Medication Sig Dispense Refill   acetaminophen  (TYLENOL ) 500 MG tablet Take 1,000 mg by mouth every 6 (six) hours as needed for mild pain (pain score 1-3) or headache.     atorvastatin  (LIPITOR) 10 MG tablet TAKE ONE TABLET BY MOUTH DAILY AT 5PM 90 tablet 11   blood glucose meter kit and supplies KIT Dispense based on patient and insurance preference. Use up to four times daily as directed. (FOR R73.03). 1 each 0   Blood Glucose Monitoring Suppl DEVI 1 each by Does not apply route in the morning, at noon, and at bedtime. May substitute to any manufacturer covered by patient's insurance. 1 each 0   citalopram  (CELEXA ) 10 MG tablet TAKE ONE TABLET (10MG  TOTAL) BY MOUTH DAILY AT 9AM 30 tablet 5   dicyclomine  (BENTYL ) 10 MG capsule Take 1 capsule (10 mg total) by mouth 2 (two) times daily as needed (intestinal spasms). 90 capsule 0   fluticasone  (FLONASE ) 50 MCG/ACT nasal spray Place 2 sprays into both nostrils daily. 16 g 11   folic acid  (FOLVITE ) 1 MG tablet TAKE ONE TABLET BY MOUTH DAILY AT 9AM 240 tablet 11   furosemide  (LASIX ) 20 MG tablet Take 1 tablet (20 mg total) by mouth daily as needed for fluid or edema.     Glucose Blood (BLOOD GLUCOSE TEST STRIPS) STRP 1 each by In Vitro route in the  morning, at noon, and at bedtime. May substitute to any manufacturer covered by patient's insurance. 100 strip 0   ipratropium (ATROVENT) 0.06 % nasal spray Place 2 sprays into the nose 3 (three) times daily. As needed for clear watery nasal drip     Lancet Device MISC 1 each by Does not apply route in the morning, at noon, and at bedtime. May substitute to any manufacturer covered by patient's insurance. 1 each 0   Lancets Misc. MISC 1 each by Does not apply route in the morning, at noon, and at bedtime. May substitute to any manufacturer covered by patient's insurance. 100 each 0   lubiprostone  (AMITIZA ) 24 MCG capsule Take 1 capsule (24 mcg total) by mouth 2 (two) times daily with a meal. 60 capsule 5   OZEMPIC , 1 MG/DOSE, 4 MG/3ML SOPN INJECT 1MG   SUBCUTANEOUSLY ONCE A WEEK 3 mL 0   rivastigmine  (EXELON ) 6 MG capsule TAKE ONE CAPSULE (6 MG TOTAL) BY MOUTH TWICE DAILY @ 9AM-5PM 60 capsule 11   Suvorexant  (BELSOMRA ) 15 MG TABS Take 1 tablet (15 mg total) by mouth at bedtime as needed. 30 tablet 0   triamcinolone  cream (KENALOG ) 0.1 % Apply 1 Application topically 2 (two) times daily. 30 g 0   No current facility-administered medications on file prior to visit.     Review of Systems     Objective:  There were no vitals filed for this visit. BP Readings from Last 3 Encounters:  01/27/24 100/60  12/24/23 128/60  12/14/23 128/72   Wt Readings from Last 3 Encounters:  01/27/24 182 lb (82.6 kg)  12/24/23 190 lb (86.2 kg)  12/13/23 189 lb 9.5 oz (86 kg)   There is no height or weight on file to calculate BMI.    Physical Exam     Lab Results  Component Value Date   WBC 10.0 12/14/2023   HGB 12.7 12/14/2023   HCT 41.2 12/14/2023   PLT 120 (L) 12/14/2023   GLUCOSE 78 12/14/2023   CHOL 150 07/29/2023   TRIG 110.0 07/29/2023   HDL 49.60 07/29/2023   LDLDIRECT 143.8 07/01/2012   LDLCALC 79 07/29/2023   ALT 19 12/14/2023   AST 20 12/14/2023   NA 138 12/14/2023   K 4.2  12/14/2023   CL 109 12/14/2023   CREATININE 0.70 12/14/2023   BUN 6 (L) 12/14/2023   CO2 21 (L) 12/14/2023   TSH 1.819 09/13/2023   INR 0.91 02/08/2011   HGBA1C 5.4 07/29/2023     Assessment & Plan:    See Problem List for Assessment and Plan of chronic medical problems.

## 2024-02-11 ENCOUNTER — Telehealth: Payer: Self-pay

## 2024-02-11 ENCOUNTER — Ambulatory Visit: Admitting: Internal Medicine

## 2024-02-11 DIAGNOSIS — I10 Essential (primary) hypertension: Secondary | ICD-10-CM

## 2024-02-11 DIAGNOSIS — G4700 Insomnia, unspecified: Secondary | ICD-10-CM

## 2024-02-11 NOTE — Telephone Encounter (Signed)
 Copied from CRM [id number]. Topic: Clinical - Medication Question >> Feb 11, 2024 12:03 PM Shereese L wrote: Reason for CRM: Patient daughter will be submitting the [facility] form via my chart or bringing by the office

## 2024-02-11 NOTE — Telephone Encounter (Signed)
Form recieved

## 2024-02-12 ENCOUNTER — Telehealth: Payer: Self-pay | Admitting: Internal Medicine

## 2024-02-12 NOTE — Telephone Encounter (Signed)
 Patient's daughter left Fl73form to be filled out.  This must mention lewey body dementia and needs the amount of care that she would receive in a nursing home.  Please call daughter at: [phone] - Janet Le when this is completed.   Form has been placed in Dr. Jinny box up front.

## 2024-02-12 NOTE — Telephone Encounter (Signed)
Form received and placed in Dr.Burns folder 

## 2024-02-12 NOTE — Telephone Encounter (Signed)
 Daughter had already previously dropped off form before.  Previous form waiting for signature from Dr. Geofm

## 2024-02-14 DIAGNOSIS — G4700 Insomnia, unspecified: Secondary | ICD-10-CM | POA: Diagnosis not present

## 2024-02-14 DIAGNOSIS — F411 Generalized anxiety disorder: Secondary | ICD-10-CM | POA: Diagnosis not present

## 2024-02-14 DIAGNOSIS — G3183 Dementia with Lewy bodies: Secondary | ICD-10-CM | POA: Diagnosis not present

## 2024-02-14 DIAGNOSIS — I10 Essential (primary) hypertension: Secondary | ICD-10-CM | POA: Diagnosis not present

## 2024-02-14 DIAGNOSIS — F0284 Dementia in other diseases classified elsewhere, unspecified severity, with anxiety: Secondary | ICD-10-CM | POA: Diagnosis not present

## 2024-02-14 DIAGNOSIS — F329 Major depressive disorder, single episode, unspecified: Secondary | ICD-10-CM | POA: Diagnosis not present

## 2024-02-14 DIAGNOSIS — F0283 Dementia in other diseases classified elsewhere, unspecified severity, with mood disturbance: Secondary | ICD-10-CM | POA: Diagnosis not present

## 2024-02-14 DIAGNOSIS — F02818 Dementia in other diseases classified elsewhere, unspecified severity, with other behavioral disturbance: Secondary | ICD-10-CM | POA: Diagnosis not present

## 2024-02-14 DIAGNOSIS — R001 Bradycardia, unspecified: Secondary | ICD-10-CM | POA: Diagnosis not present

## 2024-02-18 DIAGNOSIS — F02818 Dementia in other diseases classified elsewhere, unspecified severity, with other behavioral disturbance: Secondary | ICD-10-CM | POA: Diagnosis not present

## 2024-02-18 DIAGNOSIS — I10 Essential (primary) hypertension: Secondary | ICD-10-CM | POA: Diagnosis not present

## 2024-02-18 DIAGNOSIS — G4700 Insomnia, unspecified: Secondary | ICD-10-CM | POA: Diagnosis not present

## 2024-02-18 DIAGNOSIS — G3183 Dementia with Lewy bodies: Secondary | ICD-10-CM | POA: Diagnosis not present

## 2024-02-18 DIAGNOSIS — R001 Bradycardia, unspecified: Secondary | ICD-10-CM | POA: Diagnosis not present

## 2024-02-18 DIAGNOSIS — F411 Generalized anxiety disorder: Secondary | ICD-10-CM | POA: Diagnosis not present

## 2024-02-18 DIAGNOSIS — F329 Major depressive disorder, single episode, unspecified: Secondary | ICD-10-CM | POA: Diagnosis not present

## 2024-02-18 DIAGNOSIS — F0284 Dementia in other diseases classified elsewhere, unspecified severity, with anxiety: Secondary | ICD-10-CM | POA: Diagnosis not present

## 2024-02-18 DIAGNOSIS — F0283 Dementia in other diseases classified elsewhere, unspecified severity, with mood disturbance: Secondary | ICD-10-CM | POA: Diagnosis not present

## 2024-02-19 DIAGNOSIS — R296 Repeated falls: Secondary | ICD-10-CM | POA: Diagnosis not present

## 2024-02-19 DIAGNOSIS — Z87440 Personal history of urinary (tract) infections: Secondary | ICD-10-CM | POA: Diagnosis not present

## 2024-02-19 DIAGNOSIS — I119 Hypertensive heart disease without heart failure: Secondary | ICD-10-CM | POA: Diagnosis not present

## 2024-02-19 DIAGNOSIS — E785 Hyperlipidemia, unspecified: Secondary | ICD-10-CM | POA: Diagnosis not present

## 2024-02-19 DIAGNOSIS — D696 Thrombocytopenia, unspecified: Secondary | ICD-10-CM | POA: Diagnosis not present

## 2024-02-19 DIAGNOSIS — Z79899 Other long term (current) drug therapy: Secondary | ICD-10-CM | POA: Diagnosis not present

## 2024-02-19 DIAGNOSIS — I951 Orthostatic hypotension: Secondary | ICD-10-CM | POA: Diagnosis not present

## 2024-02-19 DIAGNOSIS — R55 Syncope and collapse: Secondary | ICD-10-CM | POA: Diagnosis not present

## 2024-02-19 DIAGNOSIS — M79602 Pain in left arm: Secondary | ICD-10-CM | POA: Diagnosis not present

## 2024-02-19 DIAGNOSIS — K5900 Constipation, unspecified: Secondary | ICD-10-CM | POA: Diagnosis not present

## 2024-02-19 DIAGNOSIS — M25552 Pain in left hip: Secondary | ICD-10-CM | POA: Diagnosis not present

## 2024-02-19 DIAGNOSIS — R531 Weakness: Secondary | ICD-10-CM | POA: Diagnosis not present

## 2024-02-19 DIAGNOSIS — R8271 Bacteriuria: Secondary | ICD-10-CM | POA: Diagnosis not present

## 2024-02-20 DIAGNOSIS — R531 Weakness: Secondary | ICD-10-CM | POA: Diagnosis not present

## 2024-02-20 DIAGNOSIS — I951 Orthostatic hypotension: Secondary | ICD-10-CM | POA: Diagnosis not present

## 2024-02-20 NOTE — Telephone Encounter (Signed)
 FL2 was completed and faxed on 02/17/24 to fax number [phone].  Conformation received.

## 2024-02-21 DIAGNOSIS — R55 Syncope and collapse: Secondary | ICD-10-CM | POA: Diagnosis not present

## 2024-02-21 DIAGNOSIS — I951 Orthostatic hypotension: Secondary | ICD-10-CM | POA: Diagnosis not present

## 2024-02-25 ENCOUNTER — Telehealth: Payer: Self-pay

## 2024-02-25 NOTE — Telephone Encounter (Signed)
 Copied from CRM #[id number]. Topic: General - Other >> Feb 25, 2024  1:49 PM Hamdi H wrote: Reason for CRM: Olam from [place] called to let provider know there will be a delay to the start, they will start seeing this patient on 9/4.   [phone] best call back number

## 2024-02-26 NOTE — Telephone Encounter (Signed)
 Noted

## 2024-02-27 DIAGNOSIS — F0283 Dementia in other diseases classified elsewhere, unspecified severity, with mood disturbance: Secondary | ICD-10-CM | POA: Diagnosis not present

## 2024-02-27 DIAGNOSIS — N760 Acute vaginitis: Secondary | ICD-10-CM | POA: Diagnosis not present

## 2024-02-27 DIAGNOSIS — G3183 Dementia with Lewy bodies: Secondary | ICD-10-CM | POA: Diagnosis not present

## 2024-02-27 DIAGNOSIS — F0284 Dementia in other diseases classified elsewhere, unspecified severity, with anxiety: Secondary | ICD-10-CM | POA: Diagnosis not present

## 2024-02-27 DIAGNOSIS — N390 Urinary tract infection, site not specified: Secondary | ICD-10-CM | POA: Diagnosis not present

## 2024-02-27 DIAGNOSIS — F411 Generalized anxiety disorder: Secondary | ICD-10-CM | POA: Diagnosis not present

## 2024-02-27 DIAGNOSIS — I951 Orthostatic hypotension: Secondary | ICD-10-CM | POA: Diagnosis not present

## 2024-02-27 DIAGNOSIS — E1140 Type 2 diabetes mellitus with diabetic neuropathy, unspecified: Secondary | ICD-10-CM | POA: Diagnosis not present

## 2024-02-27 DIAGNOSIS — F329 Major depressive disorder, single episode, unspecified: Secondary | ICD-10-CM | POA: Diagnosis not present

## 2024-02-28 ENCOUNTER — Telehealth: Payer: Self-pay | Admitting: Radiology

## 2024-02-28 ENCOUNTER — Other Ambulatory Visit: Payer: Self-pay | Admitting: Internal Medicine

## 2024-02-28 NOTE — Telephone Encounter (Signed)
 Copied from CRM [id number]. Topic: General - Other >> Feb 27, 2024  4:27 PM Burnard DEL wrote: Reason for CRM: Patients daughter Eleanor would like to know if she can come by the office and pick up copy of FL2 form that was suppose to be faxed over to Social services.She stated that social services is saying that it has not been received. >> Feb 28, 2024  8:53 AM Chasity T wrote: Eleanor daughter of patient is calling regarding forms. Says that they have been received but is missing information regarding her loibody dementia condition on there and needs to be faxed to social service with attn to sandy long or katia white. Can still be faxed to the same number

## 2024-02-28 NOTE — Telephone Encounter (Signed)
 Spoke with daughter today.  Form left up front for pick up.

## 2024-02-28 NOTE — Telephone Encounter (Signed)
 Spoke with Melissa today and form left up front for pick up.

## 2024-02-28 NOTE — Telephone Encounter (Unsigned)
 Copied from CRM [id number]. Topic: General - Other >> Feb 27, 2024  4:27 PM Burnard DEL wrote: Reason for CRM: Patients daughter Eleanor would like to know if she can come by the office and pick up copy of FL2 form that was suppose to be faxed over to Social services.She stated that social services is saying that it has not been received.

## 2024-03-01 ENCOUNTER — Encounter: Payer: Self-pay | Admitting: Internal Medicine

## 2024-03-01 DIAGNOSIS — B9689 Other specified bacterial agents as the cause of diseases classified elsewhere: Secondary | ICD-10-CM | POA: Insufficient documentation

## 2024-03-01 NOTE — Patient Instructions (Incomplete)
    Want BP < 140/90.     Medications changes include :   None     Return for follow up as scheduled.

## 2024-03-01 NOTE — Progress Notes (Unsigned)
 Subjective:    Patient ID: RUT BETTERTON, female    DOB: September 12, 1955, 68 y.o.   MRN: [id number]     HPI Janet Le is here for follow up from the hospital.  Admitted 8/27-8/29 to [facility] for syncope   Vasovagal syncope, orthostatic hypotension, frequent falls: Has a history of frequent falls.  Positive orthostatic vitals, no neurological deficits Etiology likely vasovagal he mediated EKG-NSR, telemetry without arrhythmias TTE normal EF, no valvular abnormalities IVF received, TED hose and abdominal binder Maintained good p.o. intake  Constipation: Presented with syncopal episode while straining to have a bowel movement On intensive bowel regimen  Bacterial vaginosis, asymptomatic bacteriuria: Urine culture negative Wet prep positive for bacterial vaginosis Started on vaginal estrogen, Flagyl 500 mg twice daily-7-day course  Thrombocytopenia: Platelets 110, previous 126-146  Medication changes: Vaginal estrogen started daily for 2 weeks-advised to discontinue if no improvement in symptoms Flagyl 500 mg twice daily x 7 days Advised to discontinue Ozempic  Discontinued blood pressure medications-amlodipine , telmisartan , propranolol      Medications and allergies reviewed with patient and updated if appropriate.  Current Outpatient Medications on File Prior to Visit  Medication Sig Dispense Refill   acetaminophen  (TYLENOL ) 500 MG tablet Take 1,000 mg by mouth every 6 (six) hours as needed for mild pain (pain score 1-3) or headache.     atorvastatin  (LIPITOR) 10 MG tablet TAKE ONE TABLET BY MOUTH DAILY AT 5PM 90 tablet 11   blood glucose meter kit and supplies KIT Dispense based on patient and insurance preference. Use up to four times daily as directed. (FOR R73.03). 1 each 0   Blood Glucose Monitoring Suppl DEVI 1 each by Does not apply route in the morning, at noon, and at bedtime. May substitute to any manufacturer covered by patient's  insurance. 1 each 0   citalopram  (CELEXA ) 10 MG tablet TAKE ONE TABLET (10MG  TOTAL) BY MOUTH DAILY AT 9AM 30 tablet 5   dicyclomine  (BENTYL ) 10 MG capsule Take 1 capsule (10 mg total) by mouth 2 (two) times daily as needed (intestinal spasms). 90 capsule 0   fluticasone  (FLONASE ) 50 MCG/ACT nasal spray Place 2 sprays into both nostrils daily. 16 g 11   folic acid  (FOLVITE ) 1 MG tablet TAKE ONE TABLET BY MOUTH DAILY AT 9AM 240 tablet 11   furosemide  (LASIX ) 20 MG tablet Take 1 tablet (20 mg total) by mouth daily as needed for fluid or edema.     ipratropium (ATROVENT) 0.06 % nasal spray Place 2 sprays into the nose 3 (three) times daily. As needed for clear watery nasal drip     lubiprostone  (AMITIZA ) 24 MCG capsule Take 1 capsule (24 mcg total) by mouth 2 (two) times daily with a meal. 60 capsule 5   OZEMPIC , 1 MG/DOSE, 4 MG/3ML SOPN INJECT 1MG   SUBCUTANEOUSLY ONCE A WEEK 3 mL 0   rivastigmine  (EXELON ) 6 MG capsule TAKE ONE CAPSULE (6 MG TOTAL) BY MOUTH TWICE DAILY @ 9AM-5PM 60 capsule 11   Suvorexant  (BELSOMRA ) 15 MG TABS Take 1 tablet (15 mg total) by mouth at bedtime as needed. 30 tablet 0   triamcinolone  cream (KENALOG ) 0.1 % Apply 1 Application topically 2 (two) times daily. 30 g 0   No current facility-administered medications on file prior to visit.     Review of Systems     Objective:  There were no vitals filed for this visit. BP Readings from Last 3 Encounters:  01/27/24 100/60  12/24/23 128/60  12/14/23 128/72   Wt Readings from Last 3 Encounters:  01/27/24 182 lb (82.6 kg)  12/24/23 190 lb (86.2 kg)  12/13/23 189 lb 9.5 oz (86 kg)   There is no height or weight on file to calculate BMI.    Physical Exam     Lab Results  Component Value Date   WBC 10.0 12/14/2023   HGB 12.7 12/14/2023   HCT 41.2 12/14/2023   PLT 120 (L) 12/14/2023   GLUCOSE 78 12/14/2023   CHOL 150 07/29/2023   TRIG 110.0 07/29/2023   HDL 49.60 07/29/2023   LDLDIRECT 143.8 07/01/2012    LDLCALC 79 07/29/2023   ALT 19 12/14/2023   AST 20 12/14/2023   NA 138 12/14/2023   K 4.2 12/14/2023   CL 109 12/14/2023   CREATININE 0.70 12/14/2023   BUN 6 (L) 12/14/2023   CO2 21 (L) 12/14/2023   TSH 1.819 09/13/2023   INR 0.91 02/08/2011   HGBA1C 5.4 07/29/2023     Assessment & Plan:    See Problem List for Assessment and Plan of chronic medical problems.

## 2024-03-02 ENCOUNTER — Ambulatory Visit (INDEPENDENT_AMBULATORY_CARE_PROVIDER_SITE_OTHER): Admitting: Internal Medicine

## 2024-03-02 ENCOUNTER — Telehealth: Payer: Self-pay

## 2024-03-02 VITALS — BP 132/70 | Temp 98.2°F | Ht 62.0 in | Wt 186.0 lb

## 2024-03-02 DIAGNOSIS — B9689 Other specified bacterial agents as the cause of diseases classified elsewhere: Secondary | ICD-10-CM | POA: Diagnosis not present

## 2024-03-02 DIAGNOSIS — E861 Hypovolemia: Secondary | ICD-10-CM

## 2024-03-02 DIAGNOSIS — D696 Thrombocytopenia, unspecified: Secondary | ICD-10-CM

## 2024-03-02 DIAGNOSIS — E119 Type 2 diabetes mellitus without complications: Secondary | ICD-10-CM | POA: Diagnosis not present

## 2024-03-02 DIAGNOSIS — I10 Essential (primary) hypertension: Secondary | ICD-10-CM | POA: Diagnosis not present

## 2024-03-02 DIAGNOSIS — N760 Acute vaginitis: Secondary | ICD-10-CM

## 2024-03-02 DIAGNOSIS — R55 Syncope and collapse: Secondary | ICD-10-CM | POA: Diagnosis not present

## 2024-03-02 DIAGNOSIS — E11319 Type 2 diabetes mellitus with unspecified diabetic retinopathy without macular edema: Secondary | ICD-10-CM

## 2024-03-02 DIAGNOSIS — K5909 Other constipation: Secondary | ICD-10-CM | POA: Diagnosis not present

## 2024-03-02 DIAGNOSIS — Z7985 Long-term (current) use of injectable non-insulin antidiabetic drugs: Secondary | ICD-10-CM | POA: Diagnosis not present

## 2024-03-02 MED ORDER — ESTRADIOL 0.1 MG/GM VA CREA: TOPICAL_CREAM | VAGINAL | 5 refills | Status: AC

## 2024-03-02 NOTE — Assessment & Plan Note (Addendum)
 Recent episode of vasovagal syncope-orthostatic hypotension Blood pressure medications including telmisartan , propranolol  and amlodipine  were discontinued Still has some lightheadedness-hopefully this will improve with continued increased hydration, compression socks and abdominal binder

## 2024-03-02 NOTE — Assessment & Plan Note (Addendum)
 Chronic Overall stable-level varies Will recheck at her next routine visit

## 2024-03-02 NOTE — Assessment & Plan Note (Addendum)
 Treated with Flagyl 500 mg twice daily x 7 days Resolved Denies any vaginal discharge or odor

## 2024-03-02 NOTE — Assessment & Plan Note (Addendum)
 Chronic Blood pressure controlled here today off medication Was having episodes of orthostasis and did have a vasovagal episode while straining to have a bowel movement Amlodipine , telmisartan  and propranolol  were held although propranolol  had been discontinued previously Currently not taking any medication for blood pressure Monitor BP at home

## 2024-03-02 NOTE — Assessment & Plan Note (Addendum)
 Chronic With diabetic retinopathy Was on Ozempic -recently discontinued in the hospital due to constipation Sugars well-controlled we will not restart any medication at this time Diabetic diet Follow-up in 2 months recheck A1c at that time

## 2024-03-02 NOTE — Assessment & Plan Note (Addendum)
 Chronic Trying to increase water  intake Continue Amitiza  24 mcg twice daily Ozempic  discontinued so hopefully that will improve constipation

## 2024-03-02 NOTE — Telephone Encounter (Signed)
Verbals left today. 

## 2024-03-02 NOTE — Telephone Encounter (Signed)
 Copied from CRM #[id number]. Topic: Clinical - Home Health Verbal Orders >> Mar 02, 2024 10:23 AM Miller H wrote: Caller/Agency: [facility] Number: [phone] Service Requested: Physical Therapy Frequency: 1 time a week for 9 weeks Any new concerns about the patient? No

## 2024-03-02 NOTE — Telephone Encounter (Signed)
 Okay for orders?

## 2024-03-05 DIAGNOSIS — N390 Urinary tract infection, site not specified: Secondary | ICD-10-CM | POA: Diagnosis not present

## 2024-03-05 DIAGNOSIS — N760 Acute vaginitis: Secondary | ICD-10-CM | POA: Diagnosis not present

## 2024-03-05 DIAGNOSIS — F329 Major depressive disorder, single episode, unspecified: Secondary | ICD-10-CM | POA: Diagnosis not present

## 2024-03-05 DIAGNOSIS — F0284 Dementia in other diseases classified elsewhere, unspecified severity, with anxiety: Secondary | ICD-10-CM | POA: Diagnosis not present

## 2024-03-05 DIAGNOSIS — E1140 Type 2 diabetes mellitus with diabetic neuropathy, unspecified: Secondary | ICD-10-CM | POA: Diagnosis not present

## 2024-03-05 DIAGNOSIS — F0283 Dementia in other diseases classified elsewhere, unspecified severity, with mood disturbance: Secondary | ICD-10-CM | POA: Diagnosis not present

## 2024-03-05 DIAGNOSIS — I951 Orthostatic hypotension: Secondary | ICD-10-CM | POA: Diagnosis not present

## 2024-03-05 DIAGNOSIS — G3183 Dementia with Lewy bodies: Secondary | ICD-10-CM | POA: Diagnosis not present

## 2024-03-09 ENCOUNTER — Telehealth: Payer: Self-pay | Admitting: Radiology

## 2024-03-09 DIAGNOSIS — F0284 Dementia in other diseases classified elsewhere, unspecified severity, with anxiety: Secondary | ICD-10-CM | POA: Diagnosis not present

## 2024-03-09 NOTE — Telephone Encounter (Signed)
 Copied from CRM [id number]. Topic: Clinical - Home Health Verbal Orders >> Mar 09, 2024 12:45 PM Harlene ORN wrote: Caller/Agency: Occupational Therapist with [facility] Callback Number: [phone] Service Requested: Occupational Therapy Frequency: once a week for four weeks Any new concerns about the patient? No

## 2024-03-09 NOTE — Telephone Encounter (Signed)
 Okay for orders?

## 2024-03-10 ENCOUNTER — Ambulatory Visit: Admitting: Nurse Practitioner

## 2024-03-10 DIAGNOSIS — E1140 Type 2 diabetes mellitus with diabetic neuropathy, unspecified: Secondary | ICD-10-CM | POA: Diagnosis not present

## 2024-03-10 NOTE — Telephone Encounter (Signed)
 Verbals given today.

## 2024-03-12 DIAGNOSIS — F0283 Dementia in other diseases classified elsewhere, unspecified severity, with mood disturbance: Secondary | ICD-10-CM | POA: Diagnosis not present

## 2024-03-13 DIAGNOSIS — G3183 Dementia with Lewy bodies: Secondary | ICD-10-CM | POA: Diagnosis not present

## 2024-03-13 DIAGNOSIS — N760 Acute vaginitis: Secondary | ICD-10-CM | POA: Diagnosis not present

## 2024-03-13 DIAGNOSIS — F0284 Dementia in other diseases classified elsewhere, unspecified severity, with anxiety: Secondary | ICD-10-CM | POA: Diagnosis not present

## 2024-03-13 DIAGNOSIS — I951 Orthostatic hypotension: Secondary | ICD-10-CM | POA: Diagnosis not present

## 2024-03-13 DIAGNOSIS — F329 Major depressive disorder, single episode, unspecified: Secondary | ICD-10-CM | POA: Diagnosis not present

## 2024-03-13 DIAGNOSIS — E1140 Type 2 diabetes mellitus with diabetic neuropathy, unspecified: Secondary | ICD-10-CM | POA: Diagnosis not present

## 2024-03-13 DIAGNOSIS — F411 Generalized anxiety disorder: Secondary | ICD-10-CM | POA: Diagnosis not present

## 2024-03-13 DIAGNOSIS — N390 Urinary tract infection, site not specified: Secondary | ICD-10-CM | POA: Diagnosis not present

## 2024-03-13 DIAGNOSIS — F0283 Dementia in other diseases classified elsewhere, unspecified severity, with mood disturbance: Secondary | ICD-10-CM | POA: Diagnosis not present

## 2024-03-16 DIAGNOSIS — G3183 Dementia with Lewy bodies: Secondary | ICD-10-CM | POA: Diagnosis not present

## 2024-03-18 DIAGNOSIS — G3183 Dementia with Lewy bodies: Secondary | ICD-10-CM | POA: Diagnosis not present

## 2024-03-18 DIAGNOSIS — N760 Acute vaginitis: Secondary | ICD-10-CM | POA: Diagnosis not present

## 2024-03-18 DIAGNOSIS — I951 Orthostatic hypotension: Secondary | ICD-10-CM | POA: Diagnosis not present

## 2024-03-18 DIAGNOSIS — E1140 Type 2 diabetes mellitus with diabetic neuropathy, unspecified: Secondary | ICD-10-CM | POA: Diagnosis not present

## 2024-03-18 DIAGNOSIS — F411 Generalized anxiety disorder: Secondary | ICD-10-CM | POA: Diagnosis not present

## 2024-03-18 DIAGNOSIS — F0283 Dementia in other diseases classified elsewhere, unspecified severity, with mood disturbance: Secondary | ICD-10-CM | POA: Diagnosis not present

## 2024-03-18 DIAGNOSIS — N390 Urinary tract infection, site not specified: Secondary | ICD-10-CM | POA: Diagnosis not present

## 2024-03-18 DIAGNOSIS — F329 Major depressive disorder, single episode, unspecified: Secondary | ICD-10-CM | POA: Diagnosis not present

## 2024-03-18 DIAGNOSIS — F0284 Dementia in other diseases classified elsewhere, unspecified severity, with anxiety: Secondary | ICD-10-CM | POA: Diagnosis not present

## 2024-03-25 DIAGNOSIS — N390 Urinary tract infection, site not specified: Secondary | ICD-10-CM | POA: Diagnosis not present

## 2024-03-25 DIAGNOSIS — G3183 Dementia with Lewy bodies: Secondary | ICD-10-CM | POA: Diagnosis not present

## 2024-03-25 DIAGNOSIS — I951 Orthostatic hypotension: Secondary | ICD-10-CM | POA: Diagnosis not present

## 2024-03-25 DIAGNOSIS — E1140 Type 2 diabetes mellitus with diabetic neuropathy, unspecified: Secondary | ICD-10-CM | POA: Diagnosis not present

## 2024-03-25 DIAGNOSIS — F0284 Dementia in other diseases classified elsewhere, unspecified severity, with anxiety: Secondary | ICD-10-CM | POA: Diagnosis not present

## 2024-03-25 DIAGNOSIS — F411 Generalized anxiety disorder: Secondary | ICD-10-CM | POA: Diagnosis not present

## 2024-03-25 DIAGNOSIS — F0283 Dementia in other diseases classified elsewhere, unspecified severity, with mood disturbance: Secondary | ICD-10-CM | POA: Diagnosis not present

## 2024-03-25 DIAGNOSIS — F329 Major depressive disorder, single episode, unspecified: Secondary | ICD-10-CM | POA: Diagnosis not present

## 2024-03-25 DIAGNOSIS — N760 Acute vaginitis: Secondary | ICD-10-CM | POA: Diagnosis not present

## 2024-03-26 ENCOUNTER — Other Ambulatory Visit: Payer: Self-pay | Admitting: Internal Medicine

## 2024-03-28 DIAGNOSIS — N760 Acute vaginitis: Secondary | ICD-10-CM | POA: Diagnosis not present

## 2024-03-28 DIAGNOSIS — F0283 Dementia in other diseases classified elsewhere, unspecified severity, with mood disturbance: Secondary | ICD-10-CM | POA: Diagnosis not present

## 2024-03-28 DIAGNOSIS — E1140 Type 2 diabetes mellitus with diabetic neuropathy, unspecified: Secondary | ICD-10-CM | POA: Diagnosis not present

## 2024-03-28 DIAGNOSIS — G3183 Dementia with Lewy bodies: Secondary | ICD-10-CM | POA: Diagnosis not present

## 2024-03-28 DIAGNOSIS — I951 Orthostatic hypotension: Secondary | ICD-10-CM | POA: Diagnosis not present

## 2024-03-28 DIAGNOSIS — N390 Urinary tract infection, site not specified: Secondary | ICD-10-CM | POA: Diagnosis not present

## 2024-03-28 DIAGNOSIS — F411 Generalized anxiety disorder: Secondary | ICD-10-CM | POA: Diagnosis not present

## 2024-03-28 DIAGNOSIS — F0284 Dementia in other diseases classified elsewhere, unspecified severity, with anxiety: Secondary | ICD-10-CM | POA: Diagnosis not present

## 2024-03-28 DIAGNOSIS — F329 Major depressive disorder, single episode, unspecified: Secondary | ICD-10-CM | POA: Diagnosis not present

## 2024-03-30 ENCOUNTER — Other Ambulatory Visit: Payer: Self-pay | Admitting: Internal Medicine

## 2024-03-30 DIAGNOSIS — D696 Thrombocytopenia, unspecified: Secondary | ICD-10-CM

## 2024-03-30 DIAGNOSIS — I951 Orthostatic hypotension: Secondary | ICD-10-CM | POA: Diagnosis not present

## 2024-03-30 DIAGNOSIS — I10 Essential (primary) hypertension: Secondary | ICD-10-CM

## 2024-03-30 DIAGNOSIS — E1140 Type 2 diabetes mellitus with diabetic neuropathy, unspecified: Secondary | ICD-10-CM | POA: Diagnosis not present

## 2024-03-30 DIAGNOSIS — F0283 Dementia in other diseases classified elsewhere, unspecified severity, with mood disturbance: Secondary | ICD-10-CM | POA: Diagnosis not present

## 2024-03-30 DIAGNOSIS — F0284 Dementia in other diseases classified elsewhere, unspecified severity, with anxiety: Secondary | ICD-10-CM | POA: Diagnosis not present

## 2024-03-30 DIAGNOSIS — N390 Urinary tract infection, site not specified: Secondary | ICD-10-CM | POA: Diagnosis not present

## 2024-03-30 DIAGNOSIS — N760 Acute vaginitis: Secondary | ICD-10-CM | POA: Diagnosis not present

## 2024-03-30 DIAGNOSIS — G3183 Dementia with Lewy bodies: Secondary | ICD-10-CM | POA: Diagnosis not present

## 2024-03-30 DIAGNOSIS — K581 Irritable bowel syndrome with constipation: Secondary | ICD-10-CM

## 2024-03-30 DIAGNOSIS — F329 Major depressive disorder, single episode, unspecified: Secondary | ICD-10-CM | POA: Diagnosis not present

## 2024-03-30 DIAGNOSIS — F411 Generalized anxiety disorder: Secondary | ICD-10-CM | POA: Diagnosis not present

## 2024-04-01 ENCOUNTER — Telehealth: Payer: Self-pay | Admitting: Internal Medicine

## 2024-04-01 DIAGNOSIS — I951 Orthostatic hypotension: Secondary | ICD-10-CM | POA: Diagnosis not present

## 2024-04-01 DIAGNOSIS — F0284 Dementia in other diseases classified elsewhere, unspecified severity, with anxiety: Secondary | ICD-10-CM | POA: Diagnosis not present

## 2024-04-01 DIAGNOSIS — N760 Acute vaginitis: Secondary | ICD-10-CM | POA: Diagnosis not present

## 2024-04-01 DIAGNOSIS — F411 Generalized anxiety disorder: Secondary | ICD-10-CM | POA: Diagnosis not present

## 2024-04-01 DIAGNOSIS — G3183 Dementia with Lewy bodies: Secondary | ICD-10-CM | POA: Diagnosis not present

## 2024-04-01 DIAGNOSIS — N390 Urinary tract infection, site not specified: Secondary | ICD-10-CM | POA: Diagnosis not present

## 2024-04-01 DIAGNOSIS — F0283 Dementia in other diseases classified elsewhere, unspecified severity, with mood disturbance: Secondary | ICD-10-CM | POA: Diagnosis not present

## 2024-04-01 DIAGNOSIS — F329 Major depressive disorder, single episode, unspecified: Secondary | ICD-10-CM | POA: Diagnosis not present

## 2024-04-01 DIAGNOSIS — E1140 Type 2 diabetes mellitus with diabetic neuropathy, unspecified: Secondary | ICD-10-CM | POA: Diagnosis not present

## 2024-04-01 NOTE — Telephone Encounter (Unsigned)
 Copied from CRM #[id number]. Topic: Clinical - Medication Refill >> Apr 01, 2024  9:34 AM Thersia C wrote: Medication:  linaclotide  (LINZESS ) 72 MCG capsule  propranolol  (INDERAL ) 40 MG tablet  Neither medication are in her current med list, put patient wants them refilled   Has the patient contacted their pharmacy? Yes (Agent: If no, request that the patient contact the pharmacy for the refill. If patient does not wish to contact the pharmacy document the reason why and proceed with request.) (Agent: If yes, when and what did the pharmacy advise?)  This is the patient's preferred pharmacy:   [facility] [place] [address] [place] [address] Phone: [phone] Fax: [phone]  Is this the correct pharmacy for this prescription? Yes If no, delete pharmacy and type the correct one.   Has the prescription been filled recently? No  Is the patient out of the medication? Yes  Has the patient been seen for an appointment in the last year OR does the patient have an upcoming appointment? Yes  Can we respond through MyChart? Yes  Agent: Please be advised that Rx refills may take up to 3 business days. We ask that you follow-up with your pharmacy.

## 2024-04-02 NOTE — Telephone Encounter (Signed)
 Spoke with daughter and confirmed mother is not taking these meds.

## 2024-04-03 ENCOUNTER — Telehealth: Payer: Self-pay

## 2024-04-03 NOTE — Telephone Encounter (Signed)
 Copied from CRM [id number]. Topic: Clinical - Medication Question >> Apr 03, 2024  3:09 PM Janet Le wrote: Reason for CRM: [facility] from [facility] is calling to requesting medication refill for medications that's no longer on the list of meds...  linaclotide  (LINZESS ) 72 MCG capsule  propranolol  (INDERAL ) 40 MG tablet

## 2024-04-03 NOTE — Telephone Encounter (Signed)
 Spoke with Jonette, pharmacist today.  Meds d/c

## 2024-04-03 NOTE — Telephone Encounter (Signed)
 Medications will not be refilled.  She is not to take these

## 2024-04-03 NOTE — Telephone Encounter (Signed)
 Okay for orders?

## 2024-04-03 NOTE — Telephone Encounter (Signed)
 Copied from CRM [id number]. Topic: Clinical - Home Health Verbal Orders >> Apr 03, 2024  2:18 PM Berneda FALCON wrote: Caller/Agency: Signe Edison with [facility] Callback Number: [phone] Service Requested: Occupational Therapy Frequency: 1X1 for OT and Home Health 1W3 (both beginning next week) Any new concerns about the patient? No

## 2024-04-06 ENCOUNTER — Telehealth: Payer: Self-pay

## 2024-04-06 NOTE — Telephone Encounter (Signed)
 Copied from CRM [id number]. Topic: Referral - Question >> Apr 03, 2024  3:13 PM Janet Le ORN wrote: Reason for CRM: pt daughter calling on behalf of pt to follow up on order request for patch that was sent around the time of her urology referral. Please contact for follow up

## 2024-04-07 ENCOUNTER — Other Ambulatory Visit: Payer: Self-pay | Admitting: Internal Medicine

## 2024-04-07 NOTE — Telephone Encounter (Signed)
 Attempted to leave verbals but VM is full.  If he calls back okay to provide verbal ok for orders.

## 2024-04-08 DIAGNOSIS — F0283 Dementia in other diseases classified elsewhere, unspecified severity, with mood disturbance: Secondary | ICD-10-CM | POA: Diagnosis not present

## 2024-04-08 DIAGNOSIS — F0284 Dementia in other diseases classified elsewhere, unspecified severity, with anxiety: Secondary | ICD-10-CM | POA: Diagnosis not present

## 2024-04-08 DIAGNOSIS — N390 Urinary tract infection, site not specified: Secondary | ICD-10-CM | POA: Diagnosis not present

## 2024-04-08 DIAGNOSIS — F411 Generalized anxiety disorder: Secondary | ICD-10-CM | POA: Diagnosis not present

## 2024-04-08 DIAGNOSIS — N760 Acute vaginitis: Secondary | ICD-10-CM | POA: Diagnosis not present

## 2024-04-08 DIAGNOSIS — E1140 Type 2 diabetes mellitus with diabetic neuropathy, unspecified: Secondary | ICD-10-CM | POA: Diagnosis not present

## 2024-04-08 DIAGNOSIS — F329 Major depressive disorder, single episode, unspecified: Secondary | ICD-10-CM | POA: Diagnosis not present

## 2024-04-08 DIAGNOSIS — I951 Orthostatic hypotension: Secondary | ICD-10-CM | POA: Diagnosis not present

## 2024-04-08 DIAGNOSIS — G3183 Dementia with Lewy bodies: Secondary | ICD-10-CM | POA: Diagnosis not present

## 2024-04-10 ENCOUNTER — Encounter: Payer: Self-pay | Admitting: Pharmacist

## 2024-04-10 NOTE — Telephone Encounter (Signed)
 Message left for Melissa this morning

## 2024-04-10 NOTE — Progress Notes (Signed)
 Pharmacy Quality Measure Review  This patient is appearing on a report for being at risk of failing the adherence measure for diabetes medications this calendar year.   Medication: Ozempic  1 mg Last fill date: 12/2023 for 28 day supply  Upon chart review, noted that per PCP 03/02/24 note, Ozempic  was discontinued during hospitalization due to constipation. Will fail metric. No action needed.  Darrelyn Drum, PharmD, BCPS, CPP Clinical Pharmacist Practitioner [facility] [phone]

## 2024-04-13 ENCOUNTER — Ambulatory Visit: Payer: Self-pay

## 2024-04-13 NOTE — Telephone Encounter (Signed)
 Appointment made for 04/14/2024 at 9:40am with [name] Ku NP at PCP office   FYI Only or Action Required?: FYI only for provider.  Patient was last seen in primary care on 03/02/2024 by Geofm Glade PARAS, MD.  Called Nurse Triage reporting Urinary Frequency.  Symptoms began a week ago but worse in the past two days.  Interventions attempted: OTC medications: urinary supplements, Rest, hydration, or home remedies, and Other: cranberry juice.  Symptoms are: gradually worsening.  Triage Disposition: See Physician Within 24 Hours  Patient/caregiver understands and will follow disposition?: Yes            Message from Lauren C sent at 04/13/2024  2:19 PM EDT  Reason for Triage: Thinks she has a UTI. Says that it doesn't hurt down there, but it does itch. Established with [facility] with Dr. Geofm. Please return call at [phone].   Reason for Disposition  Urinating more frequently than usual (i.e., frequency) OR new-onset of the feeling of an urgent need to urinate (i.e., urgency)  Answer Assessment - Initial Assessment Questions Daughter gave patient  cranberry juice and over the counter urinary supplements Worse in the past two days Daughter states sense of urgency and frequency----she also states that the patient has dementia and her confusion is getting a little worse Provider in [place] had given patient a vaginal cream to help prevent UTIs---supposed to use it every week or so---daughter isnt sure exactly what cream this is Patient's daughter is advised to call us  back if anything changes or with any further questions/concerns. Patient's daughter is advised that if anything worsens to go to the Emergency Room. Patient's daughter verbalized understanding.    1. SYMPTOM: What's the main symptom you're concerned about? (e.g., frequency, incontinence)     Frequency, sense of urgency, some itching 2. ONSET: When did the  symptoms  start?     Last  week 3. PAIN: Is there any pain? If Yes, ask: How bad is it? (Scale: 1-10; mild, moderate, severe)     Sense of urgency 4. CAUSE: What do you think is causing the symptoms?     ---- 5. OTHER SYMPTOMS: Do you have any other symptoms? (e.g., blood in urine, fever, flank pain, pain with urination)     Some increased confusion with dementia  Protocols used: Urinary Symptoms-A-AH

## 2024-04-14 ENCOUNTER — Other Ambulatory Visit (HOSPITAL_COMMUNITY)
Admission: RE | Admit: 2024-04-14 | Discharge: 2024-04-14 | Disposition: A | Discharge status: Home / Self Care | Source: Ambulatory Visit | Attending: Family Medicine | Admitting: Family Medicine

## 2024-04-14 ENCOUNTER — Ambulatory Visit: Admitting: Nurse Practitioner

## 2024-04-14 ENCOUNTER — Ambulatory Visit (INDEPENDENT_AMBULATORY_CARE_PROVIDER_SITE_OTHER): Admitting: Family Medicine

## 2024-04-14 ENCOUNTER — Encounter: Payer: Self-pay | Admitting: Family Medicine

## 2024-04-14 VITALS — BP 138/90 | HR 76 | Temp 98.6°F | Ht 62.0 in | Wt 183.6 lb

## 2024-04-14 DIAGNOSIS — E1165 Type 2 diabetes mellitus with hyperglycemia: Secondary | ICD-10-CM

## 2024-04-14 DIAGNOSIS — R300 Dysuria: Secondary | ICD-10-CM | POA: Diagnosis not present

## 2024-04-14 DIAGNOSIS — K5904 Chronic idiopathic constipation: Secondary | ICD-10-CM

## 2024-04-14 DIAGNOSIS — F02A3 Dementia in other diseases classified elsewhere, mild, with mood disturbance: Secondary | ICD-10-CM

## 2024-04-14 DIAGNOSIS — R32 Unspecified urinary incontinence: Secondary | ICD-10-CM | POA: Diagnosis not present

## 2024-04-14 DIAGNOSIS — R21 Rash and other nonspecific skin eruption: Secondary | ICD-10-CM

## 2024-04-14 DIAGNOSIS — N761 Subacute and chronic vaginitis: Secondary | ICD-10-CM | POA: Diagnosis present

## 2024-04-14 DIAGNOSIS — G479 Sleep disorder, unspecified: Secondary | ICD-10-CM | POA: Diagnosis not present

## 2024-04-14 DIAGNOSIS — K5901 Slow transit constipation: Secondary | ICD-10-CM | POA: Insufficient documentation

## 2024-04-14 DIAGNOSIS — N390 Urinary tract infection, site not specified: Secondary | ICD-10-CM

## 2024-04-14 DIAGNOSIS — G3183 Dementia with Lewy bodies: Secondary | ICD-10-CM | POA: Diagnosis not present

## 2024-04-14 DIAGNOSIS — I10 Essential (primary) hypertension: Secondary | ICD-10-CM | POA: Diagnosis not present

## 2024-04-14 LAB — POCT URINALYSIS DIP (CLINITEK)
Bilirubin, UA: NEGATIVE
Blood, UA: NEGATIVE
Glucose, UA: NEGATIVE mg/dL
Ketones, POC UA: NEGATIVE mg/dL
Nitrite, UA: NEGATIVE
Spec Grav, UA: 1.015 (ref 1.010–1.025)
Urobilinogen, UA: 1 U/dL
pH, UA: 6 (ref 5.0–8.0)

## 2024-04-14 MED ORDER — DEXCOM G7 15 DAY SENSOR MISC: 1.0000 | 3 refills | Status: AC

## 2024-04-14 MED ORDER — A & D ZINC OXIDE EX CREA: 1.0000 | TOPICAL_CREAM | Freq: Two times a day (BID) | CUTANEOUS | 0 refills | Status: AC

## 2024-04-14 MED ORDER — POLYETHYLENE GLYCOL 3350 17 GM/SCOOP PO POWD: 17.0000 g | Freq: Every day | ORAL | 1 refills | Status: AC | PRN

## 2024-04-14 MED ORDER — CEPHALEXIN 500 MG PO CAPS: 500.0000 mg | ORAL_CAPSULE | Freq: Two times a day (BID) | ORAL | 0 refills | Status: AC

## 2024-04-14 MED ORDER — DEXCOM G7 RECEIVER DEVI
1.0000 | 0 refills | Status: AC
Start: 2024-04-14 — End: ?

## 2024-04-14 NOTE — Progress Notes (Signed)
 Acute Office Visit  Subjective:     Patient ID: Janet Le, female    DOB: 04/18/1956, 68 y.o.   MRN: [id number]  Chief Complaint  Patient presents with   Acute Visit    Urinary frequency ongoing for 1 month. Is having external itching. Trying OTC UTI defense, and cranberry juice.    HPI  Discussed the use of AI scribe software for clinical note transcription with the patient, who gave verbal consent to proceed.  History of Present Illness Janet Le is a 68 year old female who presents with recurrent urinary tract infections and vaginal itching. She is accompanied by her daughter today who is acting as historian.  Recurrent urinary tract infections - Recurrent urinary tract infections occurring approximately every other month since the beginning of the year - Significant impact on sleep due to nocturia and frequent urination - Difficulty maintaining adequate hydration despite increased water  intake - Previous episode required treatment in [place] around Labor Day  Vulvovaginal pruritus and irritation - Persistent vaginal itching without associated discharge - Irritation present in the vulvovaginal area - Changes briefs approximately every other time she uses the bathroom     ROS Per HPI      Objective:    BP (!) 138/90 (BP Location: Left Arm, Patient Position: Sitting)   Pulse 76   Temp 98.6 F (37 C) (Temporal)   Ht 5' 2 (1.575 m)   Wt 183 lb 9.6 oz (83.3 kg)   SpO2 97%   BMI 33.58 kg/m    Physical Exam Vitals and nursing note reviewed. Chaperone present: Wilford Olden, CMA.  Constitutional:      General: She is not in acute distress.    Appearance: Normal appearance.  HENT:     Head: Normocephalic and atraumatic.     Right Ear: External ear normal.     Left Ear: External ear normal.     Nose: Nose normal.     Mouth/Throat:     Mouth: Mucous membranes are moist.     Pharynx: Oropharynx is clear.  Eyes:     Extraocular Movements:  Extraocular movements intact.     Pupils: Pupils are equal, round, and reactive to light.  Cardiovascular:     Rate and Rhythm: Normal rate and regular rhythm.     Pulses: Normal pulses.     Heart sounds: Normal heart sounds.  Pulmonary:     Effort: Pulmonary effort is normal. No respiratory distress.     Breath sounds: Normal breath sounds. No wheezing, rhonchi or rales.  Abdominal:     General: There is no distension.     Palpations: Abdomen is soft. There is no mass.     Tenderness: There is no abdominal tenderness. There is no right CVA tenderness or left CVA tenderness.  Genitourinary:    Comments: Inner labial skin mildly erythematous, mildly tender. No lesions, no discharge Musculoskeletal:     Right lower leg: No edema.     Left lower leg: No edema.     Comments: Using single point cane   Lymphadenopathy:     Cervical: No cervical adenopathy.  Skin:    General: Skin is warm and dry.     Findings: Rash (posterior neck, upper back, see photo) present.  Neurological:     General: No focal deficit present.     Mental Status: She is alert and oriented to person, place, and time.  Psychiatric:        Mood and Affect:  Mood normal.        Speech: Speech normal.        Behavior: Behavior is cooperative.        Thought Content: Thought content normal.        Cognition and Memory: Memory is impaired.     Results for orders placed or performed in visit on 04/14/24  POCT URINALYSIS DIP (CLINITEK)  Result Value Ref Range   Color, UA yellow yellow   Clarity, UA cloudy (A) clear   Glucose, UA negative negative mg/dL   Bilirubin, UA negative negative   Ketones, POC UA negative negative mg/dL   Spec Grav, UA 8.984 8.989 - 1.025   Blood, UA negative negative   pH, UA 6.0 5.0 - 8.0   POC PROTEIN,UA trace negative, trace   Urobilinogen, UA 1.0 0.2 or 1.0 E.U./dL   Nitrite, UA Negative Negative   Leukocytes, UA Large (3+) (A) Negative        Assessment & Plan:   Assessment  and Plan Assessment & Plan Recurrent UTI, urinary incontinence, dysuria Recurrent UTIs with symptoms of frequent urination and dehydration. Discussed age-related anatomical changes affecting urethral elasticity. - Prescribed Keflex  500 mg PO BID for 5 days. - Sent urine for culture. - Referred to urology for further evaluation.  Chronic Vaginitis Persistent itching without discharge. - Performed vaginal swab to rule out yeast infection. - Advised use of A&D or Desitin for skin protection.  Chronic Idiopathic Constipation Request for Miralax  indicates ongoing management. - Sent prescription for Miralax .  Rash - has been using Kenalog  cream with no relief - Referral to dermatology  Type 2 diabetes with hyperglycemia -Requesting continuous glucose monitor, discussed that it may not be covered - Dexcom 7 to pharmacy  Mildly body dementia, sleep disturbance - Referral for sleep study today     Orders Placed This Encounter  Procedures   Urine Culture    Standing Status:   Future    Expected Date:   04/14/2024    Expiration Date:   04/14/2025   Ambulatory referral to Neurology    Referral Priority:   Routine    Referral Type:   Consultation    Referral Reason:   Specialty Services Required    Requested Specialty:   Neurology    Number of Visits Requested:   1   Ambulatory referral to Urology    Referral Priority:   Routine    Referral Type:   Consultation    Referral Reason:   Specialty Services Required    Requested Specialty:   Urology    Number of Visits Requested:   1   Ambulatory referral to Dermatology    Referral Priority:   Routine    Referral Type:   Consultation    Referral Reason:   Specialty Services Required    Requested Specialty:   Dermatology    Number of Visits Requested:   1   POCT URINALYSIS DIP (CLINITEK)     Meds ordered this encounter  Medications   polyethylene glycol powder (GLYCOLAX /MIRALAX ) 17 GM/SCOOP powder    Sig: Take 17 g by mouth  daily as needed. Dissolve 1 capful (17g) in 4-8 ounces of liquid and take by mouth daily.    Dispense:  3350 g    Refill:  1   Dimethicone-Zinc Oxide-Vit A-D (A & D ZINC OXIDE) CREA    Sig: Apply 1 Application topically 2 (two) times daily.    Dispense:  113 g    Refill:  0  cephALEXin  (KEFLEX ) 500 MG capsule    Sig: Take 1 capsule (500 mg total) by mouth 2 (two) times daily for 5 days.    Dispense:  10 capsule    Refill:  0    No follow-ups on file.  [name] LITTIE Ku, FNP

## 2024-04-14 NOTE — Patient Instructions (Addendum)
 Your urine looks concerning for UTI in the office today.  I will go ahead and send in Keflex  for you to take 1 capsule by mouth twice a day for 5 days.  We are also sending your urine to the lab for culture.  Will be in contact with any abnormal results that require further attention.  We sent a referral over for sleep medicine.  Will send in a referral for dermatology and [facility] urology.  Follow-up with Dr. Geofm as scheduled, sooner if needed

## 2024-04-15 DIAGNOSIS — G3183 Dementia with Lewy bodies: Secondary | ICD-10-CM | POA: Diagnosis not present

## 2024-04-15 DIAGNOSIS — E1140 Type 2 diabetes mellitus with diabetic neuropathy, unspecified: Secondary | ICD-10-CM | POA: Diagnosis not present

## 2024-04-15 DIAGNOSIS — N760 Acute vaginitis: Secondary | ICD-10-CM | POA: Diagnosis not present

## 2024-04-15 DIAGNOSIS — I951 Orthostatic hypotension: Secondary | ICD-10-CM | POA: Diagnosis not present

## 2024-04-15 DIAGNOSIS — F329 Major depressive disorder, single episode, unspecified: Secondary | ICD-10-CM | POA: Diagnosis not present

## 2024-04-15 DIAGNOSIS — N390 Urinary tract infection, site not specified: Secondary | ICD-10-CM | POA: Diagnosis not present

## 2024-04-15 DIAGNOSIS — F0283 Dementia in other diseases classified elsewhere, unspecified severity, with mood disturbance: Secondary | ICD-10-CM | POA: Diagnosis not present

## 2024-04-15 DIAGNOSIS — F0284 Dementia in other diseases classified elsewhere, unspecified severity, with anxiety: Secondary | ICD-10-CM | POA: Diagnosis not present

## 2024-04-15 LAB — CERVICOVAGINAL ANCILLARY ONLY
Bacterial Vaginitis (gardnerella): NEGATIVE
Candida Glabrata: NEGATIVE
Candida Vaginitis: NEGATIVE
Chlamydia: NEGATIVE
Comment: NEGATIVE
Comment: NEGATIVE
Comment: NEGATIVE
Comment: NEGATIVE
Comment: NEGATIVE
Comment: NORMAL
Neisseria Gonorrhea: NEGATIVE
Trichomonas: NEGATIVE

## 2024-04-15 LAB — URINE CULTURE

## 2024-04-16 ENCOUNTER — Ambulatory Visit: Payer: Self-pay | Admitting: Family Medicine

## 2024-04-17 ENCOUNTER — Telehealth: Payer: Self-pay | Admitting: Pharmacy Technician

## 2024-04-17 ENCOUNTER — Other Ambulatory Visit (HOSPITAL_COMMUNITY): Payer: Self-pay

## 2024-04-17 NOTE — Telephone Encounter (Signed)
 Pharmacy Patient Advocate Encounter   Received notification from CoverMyMeds that prior authorization for Dexcom G7 is required/requested.   Insurance verification completed.   The patient is insured through [employer].   Per test claim: PA required; PA submitted to above mentioned insurance via Latent Key/confirmation #/EOC [id number] Status is pending

## 2024-04-20 NOTE — Telephone Encounter (Signed)
 Pharmacy Patient Advocate Encounter  Received notification from [employer] that Prior Authorization for Dexcom G7 has been DENIED.  (Covered under Part B if:)The accepted use of this medical device for your condition requires that you meet one of the following: requires treatment with insulin ; OR has a documented history of problematic hypoglycemia with documentation of one of the following: More than one level 2 hypoglycemic event (defined as glucose less than 54mg /dL (3.0mmol/L)) that persists despite more than one attempt to adjust medication therapy or modification of diabetes treatment plan OR History of one level 3 hypoglycemic event (glucose less than 54mg /dL (6.9ffno/O)) characterized by altered mental or physical state requiring hypoglycemia treatment.  She isn't using insulin    PA #/Case ID/Reference #: [id number]

## 2024-04-22 DIAGNOSIS — F0284 Dementia in other diseases classified elsewhere, unspecified severity, with anxiety: Secondary | ICD-10-CM | POA: Diagnosis not present

## 2024-04-22 DIAGNOSIS — E1140 Type 2 diabetes mellitus with diabetic neuropathy, unspecified: Secondary | ICD-10-CM | POA: Diagnosis not present

## 2024-04-22 DIAGNOSIS — N760 Acute vaginitis: Secondary | ICD-10-CM | POA: Diagnosis not present

## 2024-04-22 DIAGNOSIS — F329 Major depressive disorder, single episode, unspecified: Secondary | ICD-10-CM | POA: Diagnosis not present

## 2024-04-22 DIAGNOSIS — N390 Urinary tract infection, site not specified: Secondary | ICD-10-CM | POA: Diagnosis not present

## 2024-04-22 DIAGNOSIS — I951 Orthostatic hypotension: Secondary | ICD-10-CM | POA: Diagnosis not present

## 2024-04-22 DIAGNOSIS — G3183 Dementia with Lewy bodies: Secondary | ICD-10-CM | POA: Diagnosis not present

## 2024-04-22 DIAGNOSIS — F411 Generalized anxiety disorder: Secondary | ICD-10-CM | POA: Diagnosis not present

## 2024-04-22 DIAGNOSIS — F0283 Dementia in other diseases classified elsewhere, unspecified severity, with mood disturbance: Secondary | ICD-10-CM | POA: Diagnosis not present

## 2024-04-23 DIAGNOSIS — E1140 Type 2 diabetes mellitus with diabetic neuropathy, unspecified: Secondary | ICD-10-CM | POA: Diagnosis not present

## 2024-04-23 DIAGNOSIS — F0284 Dementia in other diseases classified elsewhere, unspecified severity, with anxiety: Secondary | ICD-10-CM | POA: Diagnosis not present

## 2024-04-23 DIAGNOSIS — I951 Orthostatic hypotension: Secondary | ICD-10-CM | POA: Diagnosis not present

## 2024-04-23 DIAGNOSIS — G3183 Dementia with Lewy bodies: Secondary | ICD-10-CM | POA: Diagnosis not present

## 2024-04-23 DIAGNOSIS — F329 Major depressive disorder, single episode, unspecified: Secondary | ICD-10-CM | POA: Diagnosis not present

## 2024-04-23 DIAGNOSIS — N760 Acute vaginitis: Secondary | ICD-10-CM | POA: Diagnosis not present

## 2024-04-23 DIAGNOSIS — N390 Urinary tract infection, site not specified: Secondary | ICD-10-CM | POA: Diagnosis not present

## 2024-04-23 DIAGNOSIS — F411 Generalized anxiety disorder: Secondary | ICD-10-CM | POA: Diagnosis not present

## 2024-04-27 ENCOUNTER — Ambulatory Visit: Admitting: Internal Medicine

## 2024-04-27 ENCOUNTER — Encounter: Payer: Self-pay | Admitting: Gastroenterology

## 2024-04-27 ENCOUNTER — Encounter: Payer: Self-pay | Admitting: Family Medicine

## 2024-04-27 ENCOUNTER — Ambulatory Visit (INDEPENDENT_AMBULATORY_CARE_PROVIDER_SITE_OTHER): Admitting: Family Medicine

## 2024-04-27 VITALS — BP 122/82 | Ht 62.0 in | Wt 184.0 lb

## 2024-04-27 DIAGNOSIS — R0602 Shortness of breath: Secondary | ICD-10-CM

## 2024-04-27 DIAGNOSIS — Z23 Encounter for immunization: Secondary | ICD-10-CM | POA: Diagnosis not present

## 2024-04-27 DIAGNOSIS — R300 Dysuria: Secondary | ICD-10-CM | POA: Diagnosis not present

## 2024-04-27 DIAGNOSIS — K111 Hypertrophy of salivary gland: Secondary | ICD-10-CM

## 2024-04-27 DIAGNOSIS — M270 Developmental disorders of jaws: Secondary | ICD-10-CM

## 2024-04-27 DIAGNOSIS — G479 Sleep disorder, unspecified: Secondary | ICD-10-CM | POA: Diagnosis not present

## 2024-04-27 DIAGNOSIS — R102 Pelvic and perineal pain unspecified side: Secondary | ICD-10-CM | POA: Diagnosis not present

## 2024-04-27 DIAGNOSIS — I890 Lymphedema, not elsewhere classified: Secondary | ICD-10-CM | POA: Diagnosis not present

## 2024-04-27 DIAGNOSIS — D219 Benign neoplasm of connective and other soft tissue, unspecified: Secondary | ICD-10-CM | POA: Diagnosis not present

## 2024-04-27 DIAGNOSIS — N390 Urinary tract infection, site not specified: Secondary | ICD-10-CM | POA: Diagnosis not present

## 2024-04-27 DIAGNOSIS — F02A3 Dementia in other diseases classified elsewhere, mild, with mood disturbance: Secondary | ICD-10-CM

## 2024-04-27 DIAGNOSIS — G3183 Dementia with Lewy bodies: Secondary | ICD-10-CM | POA: Diagnosis not present

## 2024-04-27 DIAGNOSIS — R32 Unspecified urinary incontinence: Secondary | ICD-10-CM

## 2024-04-27 LAB — POCT URINALYSIS DIP (CLINITEK)
Bilirubin, UA: NEGATIVE
Blood, UA: NEGATIVE
Glucose, UA: NEGATIVE mg/dL
Ketones, POC UA: NEGATIVE mg/dL
Nitrite, UA: NEGATIVE
POC PROTEIN,UA: NEGATIVE
Spec Grav, UA: 1.015 (ref 1.010–1.025)
Urobilinogen, UA: 0.2 U/dL
pH, UA: 6 (ref 5.0–8.0)

## 2024-04-27 MED ORDER — ESZOPICLONE 2 MG PO TABS
2.0000 mg | ORAL_TABLET | Freq: Every evening | ORAL | 1 refills | Status: DC | PRN
Start: 2024-04-27 — End: 2024-05-07

## 2024-04-27 MED ORDER — CEFTRIAXONE SODIUM 500 MG IJ SOLR
500.0000 mg | Freq: Once | INTRAMUSCULAR | Status: AC
  Administered 2024-04-27: 500 mg via INTRAMUSCULAR

## 2024-04-27 MED ORDER — TRIAMCINOLONE ACETONIDE 0.1 % EX CREA: 1.0000 | TOPICAL_CREAM | Freq: Two times a day (BID) | CUTANEOUS | 0 refills | Status: AC

## 2024-04-27 NOTE — Progress Notes (Signed)
 "  Acute Office Visit  Subjective:     Patient ID: Janet Le, female    DOB: 1956-03-16, 68 y.o.   MRN: [id number]  No chief complaint on file.   HPI  Discussed the use of AI scribe software for clinical note transcription with the patient, who gave verbal consent to proceed.  History of Present Illness Janet Le is a 68 year old female with recurrent urinary tract infections who presents with concerns about persistent symptoms and management.  She is accompanied by her daughter who is acting as historian.  Recurrent urinary tract infections - Recurrent urinary tract infections with persistent symptoms despite prior treatment. - Previously received an antibiotic injection in the hospital. - Currently considering another antibiotic injection due to ongoing symptoms. - Urology appointment scheduled in one month for further evaluation. - Intermittent use of incontinence pads.  Sleep disturbances - Ongoing sleep disturbances with difficulty achieving restful sleep. - Prior trials of Belsomra , Seroquel , and Ambien  were ineffective or caused adverse effects. - Seeking referral for a sleep study.  Glycemic control and weight management - Diabetes mellitus diagnosed in 2009. - Glycemic control is currently well-managed with normal A1c levels. - Not using Ozempic . - Actively managing weight to support health and mobility.  Gastrointestinal symptoms - Bloating and gas associated with certain foods. - Occasional use of Miralax  for constipation relief.  Abdominal pain and suspected hernia - Abdominal pain with associated difficulty taking deep breaths. - Suspects presence of a hernia. - History of uterine fibroids.  Oral mucosal lesions - Hard bumps in the mouth, possibly related to salivary glands.  Lymphedema and lower extremity edema - Lymphedema with leg swelling. - Uses a fluid pill as needed for symptom management.     ROS Per HPI      Objective:     BP 122/82 (BP Location: Left Arm, Patient Position: Sitting)   Ht 5' 2 (1.575 m)   Wt 184 lb (83.5 kg)   BMI 33.65 kg/m    Physical Exam Vitals and nursing note reviewed. Chaperone present: Wilford Olden, CMA.  Constitutional:      General: She is not in acute distress.    Appearance: Normal appearance. She is normal weight.  HENT:     Head: Normocephalic and atraumatic.     Right Ear: External ear normal.     Left Ear: External ear normal.     Nose: Nose normal.     Mouth/Throat:     Mouth: Mucous membranes are moist.     Pharynx: Oropharynx is clear.  Eyes:     Extraocular Movements: Extraocular movements intact.     Pupils: Pupils are equal, round, and reactive to light.  Neck:     Vascular: No carotid bruit.  Cardiovascular:     Rate and Rhythm: Normal rate and regular rhythm.     Pulses: Normal pulses.     Heart sounds: Normal heart sounds.  Pulmonary:     Effort: Pulmonary effort is normal. No respiratory distress.     Breath sounds: Normal breath sounds. No wheezing, rhonchi or rales.  Abdominal:     General: Bowel sounds are normal. There is no distension.     Palpations: Abdomen is soft. There is no mass.     Tenderness: There is abdominal tenderness (pelvic, umbilical). There is no right CVA tenderness or left CVA tenderness.     Hernia: No hernia is present.  Genitourinary:    Comments: Inner labial skin mildly erythematous, mildly  tender. No lesions, no discharge Musculoskeletal:     Cervical back: Normal range of motion.     Right lower leg: Edema (3+) present.     Left lower leg: Edema (3+) present.     Comments: Using single point cane   Lymphadenopathy:     Cervical: No cervical adenopathy.  Skin:    General: Skin is warm and dry.     Findings: Rash (posterior neck, upper back, see photo) present.  Neurological:     General: No focal deficit present.     Mental Status: She is alert and oriented to person, place, and time.  Psychiatric:         Mood and Affect: Mood normal.        Speech: Speech normal.        Behavior: Behavior is cooperative.        Thought Content: Thought content normal.        Cognition and Memory: Memory is impaired.     Results for orders placed or performed in visit on 04/27/24  Urine Culture   Specimen: Urine  Result Value Ref Range   Source: URINE    Status: FINAL    Result:      Mixed genital flora isolated. These superficial bacteria are not indicative of a urinary tract infection. No further organism identification is warranted on this specimen. If clinically indicated, recollect clean-catch, mid-stream urine and transfer  immediately to Urine Culture Transport Tube.   POCT URINALYSIS DIP (CLINITEK)  Result Value Ref Range   Color, UA yellow yellow   Clarity, UA cloudy (A) clear   Glucose, UA negative negative mg/dL   Bilirubin, UA negative negative   Ketones, POC UA negative negative mg/dL   Spec Grav, UA 8.984 8.989 - 1.025   Blood, UA negative negative   pH, UA 6.0 5.0 - 8.0   POC PROTEIN,UA negative negative, trace   Urobilinogen, UA 0.2 0.2 or 1.0 E.U./dL   Nitrite, UA Negative Negative   Leukocytes, UA Large (3+) (A) Negative        Assessment & Plan:   Assessment and Plan Assessment & Plan Recurrent urinary tract infections, dysuria Incontinence increases UTI risk due to prolonged urine exposure. Cultures needed to identify bacteria and prevent resistance. - Administered Rocephin  injection for current UTI. - Follow up with urology in one month. - Consider preventative antibiotics post-resolution. - Obtain urine culture for targeted treatment.  Sleep disturbance Previous treatments ineffective. Lunesta  considered as alternative. - Prescribed Lunesta  2 mg. - Referred to sleep study.  Mild lewy body dementia with mood disturbance - Cont cymbalta  - Referral to Dr Tasia for further eval and mgt  Abdominal/pelvic pain/fibroids Intermittent Miralax  use and dietary  modifications in place. Constipation may cause discomfort. - Prescribed Miralax . - Encouraged dietary modifications. - hx fibroids - CT abd/pel ordered  Lymphedema Daily swelling managed with PRN fluid pills based on weight changes. - Continue fluid pills PRN. - Monitor weight and adjust fluid pill usage.  SOB - chronic - early satiety - Chest CT ordered, r/o pna, hiatal hernia  Torus mandibularis, enlarged salivary gland Possible salivary stones causing obstruction with excessive saliva production. - Ordered CT scan. - Advised sour foods to stimulate saliva and dislodge stones.  General Health Maintenance Flu vaccination due, pneumonia vaccination planned. - Administered flu vaccine. - Plan pneumonia vaccine at next visit.     Orders Placed This Encounter  Procedures   Urine Culture    Standing Status:  Future    Number of Occurrences:   1    Expected Date:   04/27/2024    Expiration Date:   04/27/2025   CT HEAD W & WO CONTRAST ( )    Standing Status:   Future    Expiration Date:   04/30/2025    If indicated for the ordered procedure, I authorize the administration of contrast media per Radiology protocol:   Yes    Preferred imaging location?:   [facility]   CT CHEST WO CONTRAST    Standing Status:   Future    Expiration Date:   04/30/2025    Preferred imaging location?:   [facility]   CT ABDOMEN PELVIS WO CONTRAST    Standing Status:   Future    Expiration Date:   04/30/2025    Preferred imaging location?:   [facility]    If indicated for the ordered procedure, I authorize the administration of oral contrast media per Radiology protocol:   Yes    Does the patient have a contrast media/X-ray dye allergy?:   No   Flu vaccine HIGH DOSE PF(Fluzone Trivalent)   Ambulatory referral to Neurology    Referral Priority:   Routine    Referral Type:   Consultation    Referral Reason:   Specialty Services Required    Requested Specialty:   Neurology     Number of Visits Requested:   1   Ambulatory referral to Behavioral Health    Referral Priority:   Routine    Referral Type:   Psychiatric    Referral Reason:   Specialty Services Required    Requested Specialty:   Behavioral Health    Number of Visits Requested:   1   Ambulatory referral to Physical Therapy    Referral Priority:   Routine    Referral Type:   Physical Medicine    Referral Reason:   Specialty Services Required    Requested Specialty:   Physical Therapy    Number of Visits Requested:   1   POCT URINALYSIS DIP (CLINITEK)     Meds ordered this encounter  Medications   eszopiclone  (LUNESTA ) 2 MG TABS tablet    Sig: Take 1 tablet (2 mg total) by mouth at bedtime as needed for sleep. Take immediately before bedtime    Dispense:  30 tablet    Refill:  1   triamcinolone  cream (KENALOG ) 0.1 %    Sig: Apply 1 Application topically 2 (two) times daily.    Dispense:  30 g    Refill:  0   cefTRIAXone  (ROCEPHIN ) injection 500 mg    Return in about 3 months (around 07/28/2024) for meds OV.  [name] LITTIE Ku, FNP  "

## 2024-04-27 NOTE — Patient Instructions (Addendum)
 We are checking labs today, will be in contact with any results that require further attention  We are checking your urine today. We will be in contact with any results that require further attention.   Follow up with GI as needed.  Referral to sleep medicine for evaluation.   Referral to Dr Tasia today.  You have received an antibiotic injection in the office today.   We have given your flu vaccine today.   Follow up with me in about 3 months for labs and medication management, sooner if needed.

## 2024-04-29 LAB — URINE CULTURE

## 2024-04-30 ENCOUNTER — Telehealth: Payer: Self-pay

## 2024-04-30 ENCOUNTER — Ambulatory Visit: Payer: Self-pay | Admitting: Family Medicine

## 2024-04-30 NOTE — Telephone Encounter (Signed)
 Copied from CRM #[id number]. Topic: Clinical - Medication Question >> Apr 30, 2024  2:28 PM Fonda T wrote: Reason for CRM: Patient daughter calling, Eleanor, [place] verified, calling back to speak back to nurse previously spoken to.  Patient daughter would like to know if she can either increase dosage of sleep medication, or if sleep medication can be changed to Ambien .  Can be reached at [phone], to discuss further.

## 2024-04-30 NOTE — Telephone Encounter (Signed)
LVM for Melissa

## 2024-05-01 ENCOUNTER — Other Ambulatory Visit: Payer: Self-pay | Admitting: Family Medicine

## 2024-05-01 NOTE — Telephone Encounter (Signed)
 Spoke with patients daughter, states she thought the Lunesta was 1mg , gave patient 1 tablet did not work after about 6mins-1 hour so she gave patient 2 more tablets, patient eventually dosed off after 30 minutes or so. In total, patient had 6mg  of Lunesta, advised her daughter that is way too much her PCP. Melissa gave verbal understanding, will check with patient to see what happened when she was taking Ambien . I also provided her with the phone numbers for all referrals that were recently placed so she can reach out to them as well. Will wait for daughters return phone call regarding Ambien .  [name] LIPS.

## 2024-05-04 NOTE — Telephone Encounter (Unsigned)
 Copied from CRM #[id number]. Topic: Clinical - Medication Question >> Apr 30, 2024  2:28 PM Fonda T wrote: Reason for CRM: Patient daughter calling, Eleanor, [place] verified, calling back to speak back to nurse previously spoken to.  Patient daughter would like to know if she can either increase dosage of sleep medication, or if sleep medication can be changed to Ambien .  Can be reached at [phone], to discuss further. >> May 04, 2024 10:23 AM China J wrote: Patient's daughter is calling back to let the nurse know that the patinet wants to continue taking eszopiclone (LUNESTA) 2 MG TABS tablet and would like to increase the dose. She does not want to change to ambien .  The patinet's daughter wanted to apologize for not getting back to the clinic last week, as there was a passing in the family. Please give her a call for an update as she is also aware of the same day turn around time.

## 2024-05-05 ENCOUNTER — Encounter: Payer: Self-pay | Admitting: Family Medicine

## 2024-05-05 NOTE — Progress Notes (Signed)
 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 RH female with a history of HTN, DM2, TB, IBS, lymphedema, OA, osteoporosis, RCC,  mild LBD with behavioral disturbance seen today in follow up for memory loss. Patient is currently on rivastigmine  6 mg twice daily***.  For mood she is on citalopram  10 mg daily and for sleep 100 mg of trazodone  nightly.  For hallucinations she is on Depakote  125 mg nightly.  Patient is able to participate on ADLs, no longer drives***mood is***    Follow up in   months. Continue rivastigmine  6 mg twice daily Continue citalopram  10 mg daily for anxiety, side effects discussed. Continue Depakote  125 mg nightly for hallucinations, side effects discussed Continue trazodone  100 mg nightly for sleep Recommend good control of her cardiovascular risk factors Continue to control mood as per PCP Referred to palliative care***     Subjective:    This patient is accompanied in the office by her daughter*** who supplements the history.  Previous records as well as any outside records available were reviewed prior to todays visit. Patient was last seen on ***   Any changes in memory since last visit? Janet Le  Both STM and TM are affected repeats oneself?  Endorsed, frequently Disoriented when walking into a room? Denies ***  Leaving objects?  May misplace things such as the remote, but not in unusual places***  Wandering behavior?  denies   Any personality changes since last visit?  Denies.   Any worsening depression?:  She has some anxiety and depression, well-controlled with current medications*** Hallucinations or paranoia?  Endorsed, she sees chickens being served to other chickens, caring music, talking, grandkids in [employer]*** Seizures? denies    Any sleep changes?  Sleeps better with trazodone  and Depakote .  She reports vivid dreams, acting out by having a regular conversation without other REM behavior or  sleepwalking.  She spends a great amount of time in bed because she does not socialize as much. Sleep apnea?   Denies.   Any hygiene concerns?  She has a hard time reaching to certain places and then she has a UTI -daughter says Independent of bathing and dressing?  Endorsed  Does the patient needs help with medications?  Other is in charge *** Who is in charge of the finances?  Daughter is in charge   *** Any changes in appetite?  denies ***   Patient have trouble swallowing? Denies.   Does the patient cook? No Any headaches?  She has a history of blood pressure related headaches and cervicogenic headaches.    Any vision changes?  Sometimes uses some lights with the headaches. Chronic back pain  denies   Ambulates with difficulty? Denies.  She uses a cane for stability when she goes out sometimes a walker*** Recent falls or head injuries? Denies.     Unilateral weakness, numbness or tingling?  she is a history of peripheral neuropathy with prior extensive workup, chronic pain, DDD and spasms, knee pain, all these limiting her mobility Any tremors?  History of bilateral hand tremors,, tolu propranolol *** Any anosmia?  Denies   Any incontinence of urine?  Endorsed, with recurrent UTIs most recently in November***  Any bowel dysfunction?  Endorsed, she has a history of IBS    Patient lives with her daughter*** Does the patient drive? No longer drives ***   HISTORY: I.  LEWY BODY DEMENTIA: In 2014, she started experiencing visual disturbance.  She describes initially seeing a line of light along the bottom of her visual field in the left eye.  She then developed what looks like bubbles floating up in the temporal aspect of the visual fields of in both eyes.  It lasts only a few seconds and occurs several times a day.  She also began experiencing dizziness and lightheadedness and was found to have orthostatic hypotension.  In 2018, she began noticing memory problems.  Labs from May 2019  included normal B12 528 and TSH 1.37.  She has been treated for latent TB.   Around this time, she began experiencing tremors in the hands and occasional jerks of her extremities.   She underwent neuropsychological testing 07/09/18.  She demonstrated probable deficits of processing speed, executive function, visual memory significantly more than verbal memory, and theatre stage manager.  It was noted that such a profile may be seen in early Parkinson's disease or a Parkinson's plus syndrome.  However, her performance may have been skewed due to observed anxiety and mildly suboptimal task persistence.  In March 2022, she was confused and hallucinating.  She was looking out of the house at her car and looked like there was somebody in her car.  When she went out to her car, nobody was there.  She started seeing people, strangers, not there in her house. She said that they were coming in through the walls.  She may quickly think she sees something (like a figure) and when she turns her head to that direction, it is gone.    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  She underwent repeat neuropsychological evaluation on 01/25/2021.  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  Given her history of gait instability, hallucinations, and tremor, Lewy body dementia suspected. MRI of brain on 12/19/2013 showed small 11 mm left frontal dural calcification vs meningioma at the left vertex.  MRI of brain on 10/05/2020 which showed stable 11 mm left frontal convexity lesion (probable meningioma), minimal chronic small vessel disease.  Insurance wouldn't approve DaT scan.  Repeat neuropsychological evaluation on 01/14/2023 demonstrated cognitive decline across all domains, especially executive functioning and aspects of learning and memory, still concerning for Lewy body disease.      Past medications:  quetiapine   (increased hallucinations and agitation), trazodone  (ineffective, excessive daytime drowsiness), duloxetine    II.  NEUROPATHY:  In 2016, she started experiencing burning pain from the bottom of her feet up the back of her legs to the knees.  It is intermittent and lasts for various and unknown period of time, but not aware of any triggers such as due to position.  In addition, she is reporting increased back pain and spasms, worse when sitting and improved when laying supine.  She has a history of chronic low back pain with left lumbar radiculopathy, as well as right knee pain.  She has been seen and evaluated by orthopedics and pain management.  MRI of lumbar spine from 07/27/16 revealed advanced L4-5 and L5-S1 facet arthrosis with some bilateral neural foraminal stenosis.  Dr. Addie of orthopedics did not think surgery was an option at that time.  TSH from 10/15/16 was 1.67.  B12 from 10/18/16 was 537.  Hgb A1c from 04/05/17 was 5.6.  NCV-EMG from 10/25/16 demonstrated no evidence of large fiber sensorimotor polyneuropathy or lumbosacral radiculopathy.  MRI of brain and cervical spine on 10/05/2020 which showed stable 11 mm  left frontal convexity meningioma, minimal chronic small vessel disease, moderate cervical spondylosis from C4 through C7 and small central disc protrusion at C7-T1 but no acute intracranial abnormality or spinal stenosis or cord abnormality.   Labs from 11/02/2020 include negative ANA, sed rate 16, ACE 23, SPEP/IFE negative for M-spike.  NCV-EMG on 11/22/2020 now demonstrated chronic sensorimotor axonal polyneuropathy.   PREVIOUS MEDICATIONS:   CURRENT MEDICATIONS:  Outpatient Encounter Medications as of 05/06/2024  Medication Sig   Accu-Chek Softclix Lancets lancets 1 each 2 (two) times daily.   acetaminophen  (TYLENOL ) 500 MG tablet Take 1,000 mg by mouth every 6 (six) hours as needed for mild pain (pain score 1-3) or headache.   atorvastatin  (LIPITOR) 10 MG tablet TAKE ONE TABLET BY MOUTH  DAILY AT 5PM   blood glucose meter kit and supplies KIT Dispense based on patient and insurance preference. Use up to four times daily as directed. (FOR R73.03).   Blood Glucose Monitoring Suppl DEVI 1 each by Does not apply route in the morning, at noon, and at bedtime. May substitute to any manufacturer covered by patient's insurance.   citalopram  (CELEXA ) 10 MG tablet TAKE ONE TABLET (10MG  TOTAL) BY MOUTH DAILY AT 9AM   Continuous Glucose Receiver (DEXCOM G7 RECEIVER) DEVI 1 each by Does not apply route continuous.   Continuous Glucose Sensor (DEXCOM G7 15 DAY SENSOR) MISC Inject 1 each into the skin continuous. Change sensor every 10 days   dicyclomine  (BENTYL ) 10 MG capsule Take 1 capsule (10 mg total) by mouth 2 (two) times daily as needed (intestinal spasms).   Dimethicone-Zinc Oxide-Vit A-D (A & D ZINC OXIDE) CREA Apply 1 Application topically 2 (two) times daily.   estradiol  (ESTRACE ) 0.1 MG/GM vaginal cream Insert 2 grams vaginally twice weekly   eszopiclone (LUNESTA) 2 MG TABS tablet Take 1 tablet (2 mg total) by mouth at bedtime as needed for sleep. Take immediately before bedtime   fluticasone  (FLONASE ) 50 MCG/ACT nasal spray Place 2 sprays into both nostrils daily.   folic acid  (FOLVITE ) 1 MG tablet TAKE ONE TABLET BY MOUTH DAILY AT 9AM   furosemide  (LASIX ) 20 MG tablet Take 1 tablet (20 mg total) by mouth daily as needed for fluid or edema.   ipratropium (ATROVENT) 0.06 % nasal spray Place 2 sprays into the nose 3 (three) times daily. As needed for clear watery nasal drip   lubiprostone  (AMITIZA ) 24 MCG capsule Take 1 capsule (24 mcg total) by mouth 2 (two) times daily with a meal.   polyethylene glycol powder (GLYCOLAX /MIRALAX ) 17 GM/SCOOP powder Take 17 g by mouth daily as needed. Dissolve 1 capful (17g) in 4-8 ounces of liquid and take by mouth daily.   rivastigmine  (EXELON ) 6 MG capsule TAKE ONE CAPSULE (6 MG TOTAL) BY MOUTH TWICE DAILY @ 9AM-5PM   triamcinolone  cream (KENALOG )  0.1 % Apply 1 Application topically 2 (two) times daily.   No facility-administered encounter medications on file as of 05/06/2024.       10/28/2023    6:00 PM 06/16/2018   10:57 AM  MMSE - Mini Mental State Exam  Orientation to time 3 5  Orientation to Place 4 5  Registration 3 3  Attention/ Calculation 5 3  Recall 2 2  Language- name 2 objects 2 2  Language- repeat 1 1  Language- follow 3 step command 3 3  Language- read & follow direction 1 1  Write a sentence 1 1  Copy design 0 1  Total score 25 27  No data to display          Objective:     PHYSICAL EXAMINATION:    VITALS:  There were no vitals filed for this visit.  GEN:  The patient appears stated age and is in NAD. HEENT:  Normocephalic, atraumatic.   Neurological examination:  General: NAD, well-groomed, appears stated age. Orientation: The patient is alert. Oriented to person, place and date Cranial nerves: There is good facial symmetry.The speech is fluent and clear, tangential at times, soft-spoken. No aphasia or dysarthria. Fund of knowledge is appropriate. Recent and remote memory are impaired. Attention and concentration are reduced. Able to name objects and repeat phrases.  Hearing is intact to conversational tone. *** Sensation: Sensation is intact to light touch throughout Motor: Strength is at least antigravity x4. DTR's 1/4 in UE/LE     Movement examination: Tone: There is normal tone in the UE/LE Abnormal movements:  no tremor noted today.  No myoclonus.  No asterixis.   Coordination:  There is no decremation with RAM's. Normal finger to nose  Gait and Station: The patient has no*** difficulty arising out of a deep-seated chair without the use of the hands. The patient's stride length is short.  Gait is cautious and plan to live road based.    Thank you for allowing us  the opportunity to participate in the care of this nice patient. Please do not hesitate to contact us  for any questions  or concerns.   Total time spent on today's visit was *** minutes dedicated to this patient today, preparing to see patient, examining the patient, ordering tests and/or medications and counseling the patient, documenting clinical information in the EHR or other health record, independently interpreting results and communicating results to the patient/family, discussing treatment and goals, answering patient's questions and coordinating care.  Cc:  Alvia [name] CROME, FNP  Camie Sevin 05/05/2024 6:07 AM

## 2024-05-06 ENCOUNTER — Encounter: Payer: Self-pay | Admitting: Physician Assistant

## 2024-05-06 ENCOUNTER — Encounter: Payer: Self-pay | Admitting: Family Medicine

## 2024-05-06 ENCOUNTER — Ambulatory Visit (INDEPENDENT_AMBULATORY_CARE_PROVIDER_SITE_OTHER): Admitting: Physician Assistant

## 2024-05-06 VITALS — BP 120/80 | HR 89 | Resp 20 | Wt 181.0 lb

## 2024-05-06 DIAGNOSIS — F02B2 Dementia in other diseases classified elsewhere, moderate, with psychotic disturbance: Secondary | ICD-10-CM

## 2024-05-06 DIAGNOSIS — G3183 Dementia with Lewy bodies: Secondary | ICD-10-CM

## 2024-05-06 NOTE — Patient Instructions (Signed)
 Keep all the medicines as prescribed Continue rivastigmine  6 mg twice daily.  Follow up March 27 at 11:30

## 2024-05-07 ENCOUNTER — Other Ambulatory Visit: Payer: Self-pay | Admitting: Family Medicine

## 2024-05-07 DIAGNOSIS — G4700 Insomnia, unspecified: Secondary | ICD-10-CM

## 2024-05-07 MED ORDER — ESZOPICLONE 3 MG PO TABS: 3.0000 mg | ORAL_TABLET | Freq: Every day | ORAL | 0 refills | Status: DC

## 2024-05-07 NOTE — Telephone Encounter (Signed)
 Spoke with patients daughter, patient is agreeable to increasing Lunesta. Patient states she does not like ambien , she did not explain further.

## 2024-05-08 NOTE — Telephone Encounter (Signed)
 Spoke with patients daughter, gave verbal understanding

## 2024-05-12 ENCOUNTER — Inpatient Hospital Stay
Admission: RE | Admit: 2024-05-12 | Discharge: 2024-05-12 | Disposition: A | Source: Ambulatory Visit | Attending: Family Medicine

## 2024-05-12 ENCOUNTER — Ambulatory Visit
Admission: RE | Admit: 2024-05-12 | Discharge: 2024-05-12 | Disposition: A | Source: Ambulatory Visit | Attending: Family Medicine

## 2024-05-12 ENCOUNTER — Telehealth: Payer: Self-pay

## 2024-05-12 ENCOUNTER — Ambulatory Visit
Admission: RE | Admit: 2024-05-12 | Discharge: 2024-05-12 | Disposition: A | Discharge status: Home / Self Care | Source: Ambulatory Visit | Attending: Family Medicine | Admitting: Family Medicine

## 2024-05-12 ENCOUNTER — Other Ambulatory Visit: Payer: Self-pay | Admitting: Family Medicine

## 2024-05-12 DIAGNOSIS — R0602 Shortness of breath: Secondary | ICD-10-CM

## 2024-05-12 DIAGNOSIS — Z23 Encounter for immunization: Secondary | ICD-10-CM

## 2024-05-12 DIAGNOSIS — M270 Developmental disorders of jaws: Secondary | ICD-10-CM

## 2024-05-12 DIAGNOSIS — R300 Dysuria: Secondary | ICD-10-CM

## 2024-05-12 DIAGNOSIS — R102 Pelvic and perineal pain unspecified side: Secondary | ICD-10-CM

## 2024-05-12 DIAGNOSIS — N390 Urinary tract infection, site not specified: Secondary | ICD-10-CM

## 2024-05-12 DIAGNOSIS — F02A3 Dementia in other diseases classified elsewhere, mild, with mood disturbance: Secondary | ICD-10-CM

## 2024-05-12 DIAGNOSIS — G479 Sleep disorder, unspecified: Secondary | ICD-10-CM

## 2024-05-12 DIAGNOSIS — D219 Benign neoplasm of connective and other soft tissue, unspecified: Secondary | ICD-10-CM

## 2024-05-12 DIAGNOSIS — R0600 Dyspnea, unspecified: Secondary | ICD-10-CM | POA: Diagnosis not present

## 2024-05-12 DIAGNOSIS — N200 Calculus of kidney: Secondary | ICD-10-CM | POA: Diagnosis not present

## 2024-05-12 DIAGNOSIS — I890 Lymphedema, not elsewhere classified: Secondary | ICD-10-CM

## 2024-05-12 DIAGNOSIS — K111 Hypertrophy of salivary gland: Secondary | ICD-10-CM

## 2024-05-12 NOTE — Addendum Note (Signed)
 Addended by: LENARD WILFORD RAMAN on: 05/12/2024 04:29 PM   Modules accepted: Orders

## 2024-05-12 NOTE — Telephone Encounter (Signed)
Order has been fixed. 

## 2024-05-12 NOTE — Telephone Encounter (Signed)
 Copied from CRM #[id number]. Topic: Clinical - Request for Lab/Test Order >> May 12, 2024 12:38 PM Viola F wrote: Reason for CRM: Jenella from [facility] needs order for CT in EPIC changed to CT maxill facial with or without (not both) as soon is possible because patient is in the office now.   Phone:[phone]  [phone]  Please update in EPIC

## 2024-05-19 ENCOUNTER — Encounter: Payer: Self-pay | Admitting: Family Medicine

## 2024-05-20 ENCOUNTER — Ambulatory Visit: Payer: Self-pay | Admitting: Family Medicine

## 2024-05-25 ENCOUNTER — Ambulatory Visit
Admission: RE | Admit: 2024-05-25 | Discharge: 2024-05-25 | Disposition: A | Discharge status: Home / Self Care | Source: Ambulatory Visit | Attending: Family Medicine | Admitting: Family Medicine

## 2024-05-25 DIAGNOSIS — K111 Hypertrophy of salivary gland: Secondary | ICD-10-CM | POA: Diagnosis not present

## 2024-05-25 DIAGNOSIS — M270 Developmental disorders of jaws: Secondary | ICD-10-CM

## 2024-05-26 NOTE — Progress Notes (Signed)
 Psychiatric Initial Adult Assessment   Patient Identification: Janet Le MRN:  [id number] Date of Evaluation:  06/08/2024 Referral Source: Primary care provider Chief Complaint:   Chief Complaint  Patient presents with   Establish Care   Anxiety   Visit Diagnosis:    ICD-10-CM   1. GAD (generalized anxiety disorder)  F41.1 citalopram  (CELEXA ) 10 MG tablet    2. REM sleep behaviors  G47.52 melatonin 3 MG TABS tablet    3. 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 Sleep disturbance  G47.9 melatonin 3 MG TABS tablet       Assessment:  Janet Le is a 68 y.o. female with a history of ADHD, dysthymia, generalized anxiety disorder, major neurocognitive disorder, Lewy body dementia, insomnia who presents in person to [facility] for initial evaluation on 06/08/2024.    At initial evaluation patient reports difficulties with sleep, fluctuating throughout the week that could be a component of her dementia with Lewy body diagnosis, REM sleep behavior disorder, could also be likely due to the psychosocial stressors that include her being unable to meet her grandchildren, her recently losing her friends.  She has been restless throughout the night.  The stressors are also impacting her mood at times but she is not feeling depressed.  Her anxiety stems around the above psychosocial stressors and memory that she has been forgetting things, impacting her ADLs.  She is also having auditory and visual hallucinations due to her diagnosis but has not been affecting her.  Recommended her to reach out to her neurologist for medication management and continuing her rivastigmine  for now.  At this time we recommended her continued psychoeducation, therapy that would help her with coping strategies, sleep schedule though would need assistance from her daughter for her appointments and  maintenance of the schedule.  Patient's daughter was receptive to the plan.  Reported that therapy would help her with dysphoric mood at times, anxiety and dealing with the psychosocial stressors.  For her sleep she has a sleep study scheduled, are waiting to hear back.  It would be beneficial for her to start her Celexa  at bedtime to help with sleep in addition to Lunesta  and adding in melatonin as needed for maintenance of sleep at night.  She does have allergic reaction to sertraline associated with spasms and numbness, would not be ideal for her to be on any other SSRI for sleep.  Antipsychotic would worsen her DLB diagnosis.  Patient's daughter was receptive to the above plan.  Will have her back in the clinic in 6 weeks.   Risk Assessment: A suicide and violence risk assessment was performed as part of this evaluation. There patient is deemed to be at chronic elevated risk for self-harm/suicide given the following factors: history of depression and chronic severe medical condition. These risk factors are mitigated by the following factors: lack of active SI/HI, no known access to weapons or firearms, no history of previous suicide attempts, no history of violence, and motivation for treatment. The patient is deemed to be at chronic elevated risk for violence given the following factors: N/A. These risk factors are mitigated by the following factors: no known history of violence towards others, no known violence towards others in the last 6 months, no known history of threats of harm towards others, no known homicidal ideation in the last 6 months, no command hallucinations to  harm others in the last 6 months, no active symptoms of psychosis, and no active symptoms of mania. There is n acute risk for suicide or violence at this time. The patient was educated about relevant modifiable risk factors including following recommendations for treatment of psychiatric illness and abstaining from substance abuse.   While future psychiatric events cannot be accurately predicted, the patient does not currently require  acute inpatient psychiatric care and does not currently meet [place]  involuntary commitment criteria.  Patient was given contact information for crisis resources, behavioral health clinic and was instructed to call 911 for emergencies.    Plan: # Lewy body dementia Past medication trials:  Status of problem: Active Interventions: -- Continue rivastigmine  6 mg daily -- Currently being managed by the neurologist  # REM sleep behavior disorder/insomnia Past medication trials:  Status of problem: Current Interventions: -- Continue Lunesta  3 mg nightly, managed by her primary care provider -- Start melatonin 3 mg as needed nightly  # MDD/GAD Past medication trials:  Status of problem: Current Interventions: -- Continue citalopram  10 mg, switch to nightly dose -- Provided her with psychotherapy resources today   History of Present Illness:   Janet Le is a 68 year old female with a history of dysthymia, ADHD, Lewy body dementia, anxiety, insomnia that presented to the clinic today with her daughter for management of sleep issues.  She has been taking rivastigmine  6 mg daily for management of Lewy body dementia and citalopram  for mood, Lunesta  for sleep. Patient reports that she has been having sleep issues due to thoughts , stated that my daughter is not allowing me to see my grandkids, that makes me feel sad .  Reported that she sleeps from 8 PM till 10:30 AM every night, wakes up in the middle around 1 AM, unable to go back to sleep at times.  She and her daughter also reported that she has been talking in the sleep, restless at times.  Reported that they have been taking Lunesta  but that has not been helpful adequately over the past few days.  When asked about depression I do not think so she has depression and when asked the patient she stated sometimes due to the  psychosocial stressors.  Reported that she has anxiety due to the above psychosocial stressors and when I talk to her, make her get ready quickly , we discussed about the involvement of stress with progression of her disease, the daughter was amenable to minimizing the impact.  She reported her mood as all of that, blah .  She denied any panic attacks.  She reported good appetite, denied any active or passive SI/HI.  When asked about safety concerns her daughter reported that yes she has been placing things in the wrong drawer, she is not allowed to cook, once got lost around the house .  Reported that her dementia has been progressing.  She also reported visual hallucinations seeing people, saw my niece , and auditory hallucinations somebody is in my house, my brother .  Reported that it does not bother her.  Her daughter reported that she has been feeling confused more lately. Reported that they have been trying to get a sleep study, as she has been snoring at night, frequently moving.  She denies any symptoms of mania, any agitated or aggressive episodes. We discussed about switching her Celexa  to nighttime dose to help with sleep in addition to mood, continuing Lunesta  managed by primary care provider and starting melatonin as needed nightly  for sleep.  Discussed risk benefits and side effects.  Encouraged her therapy, provided her with her resources today.  Will have her back in the clinic in 6 weeks.  Associated Signs/Symptoms: Depression Symptoms:  insomnia, (Hypo) Manic Symptoms:  None Anxiety Symptoms:  Excessive Worry, Psychotic Symptoms:  Hallucinations: Auditory Visual PTSD Symptoms: Negative  Past Psychiatric History:  Past psychiatric diagnoses: MDD, PTSD, ADD, GAD Psychiatric hospitalizations: None Past suicide attempts: Denies Hx of self harm: Denies Hx of violence towards others: Denies Prior psychiatric providers: Dr. Lynnetta Prior therapy: None Access to  firearms: Denies  Prior medication trials: Effexor , zolpidem , Ritalin , Adderall, Cymbalta , modafinil , Lunesta , Celexa   Substance use: Denies  Past Medical History:  Past Medical History:  Diagnosis Date   Allergic rhinitis 08/31/2009   Arthralgia of hip 11/14/2017   Attention deficit hyperactivity disorder 06/06/2020   diagnosed as adult w/o formal testing   Bilateral leg edema 06/12/2018   Blepharitis 11/07/2019   Blood dyscrasia    platelets low in past   Breast asymmetry 04/08/2021   Cataract    Cervical radiculopathy 09/07/2020   Cervicalgia 05/30/2016   Chronic low back pain 07/18/2016   Degeneration of lumbar intervertebral disc 01/09/2018   Dyspnea on exertion 05/11/2019   Endometriosis    Esophageal stricture    Essential hypertension 05/06/2009   Excessive daytime sleepiness 05/11/2019   External hemorrhoids 08/28/2010   Fatigue 11/14/2017   Generalized anxiety disorder 10/08/2017   GERD (gastroesophageal reflux disease)    Glaucoma 06/06/2020   Gout 12/11/2013   Hiatal hernia    History of positive PPD 04/09/2022   History of latent TB-completed treatment     Hyperlipidemia 03/16/2010   Hypertensive retinopathy of both eyes 10/06/2019   IBS (irritable bowel syndrome) with chronic constipation 11/14/2010   Insomnia 12/21/2022   Iridocyclitis 10/06/2019   Laryngopharyngeal reflux (LPR) 07/03/2018   Lattice degeneration of both retinas 10/06/2019   Left lumbar radiculopathy 04/13/2012   Lewy body dementia 01/14/2023   Dr Skeet     LVH (left ventricular hypertrophy), mild 05/18/2017   Echo 04/2017 - mild LVH, normal EF, Grade 1 DD   Lymphedema    Major depressive disorder 10/08/2017   Migraine 06/14/2009   Morbid obesity 11/06/2013   Neuropathy 10/07/2017   Nocturnal hypoxemia 05/11/2019   Osteopenia 11/18/2015   06/10/2018: LFN -1.1, left radius 0.7, low FRAX.  No significant change from 2017  dexa 10/2015: t score  Spine -1.3, dual femur -0.9  Took  alendronate  2014-2019   Pain in left knee 09/08/2014   Post traumatic stress disorder (PTSD)    Primary osteoarthritis involving multiple joints 03/10/2019   Bilateral hips, knees   Pseudophakia of both eyes 10/06/2019   Pure hypercholesterolemia 03/16/2010   REM sleep behaviors    Renal cell carcinoma    R cryoablation by IR 02/16/11, Dr. Luverne  Followed by Dr. Laurina  No evidence of residual disease on CT 12/13 and 12/14   Rib pain on left side 01/03/2021   S/P knee surgery 10/30/2016   Snoring 04/09/2022   Spondylolisthesis of lumbar region 01/09/2018   Syncope and collapse 05/16/2009   since childhood; associated with extreme heat   TB lung, latent 11/25/2019   Thrombocytopenia 05/30/2015   Saw hem 02/2017 - mild, chronic - advised to stop protonix , no concerning cause, plan for pcp to monitor   Tinnitus of right ear 07/03/2018   Type 2 diabetes mellitus without complication, without long-term current use of insulin  10/06/2019  Urinary incontinence 07/03/2021   Venous insufficiency of both lower extremities 04/06/2021   Visual hallucinations    Vulvar itching 01/03/2021    Past Surgical History:  Procedure Laterality Date   BREAST CYST EXCISION Bilateral over 10 years ago   No visable scar    CATARACT EXTRACTION, BILATERAL Bilateral 2020   COLONOSCOPY     fallopian tube removed     FUNCTIONAL ENDOSCOPIC SINUS SURGERY     HEMORRHOID BANDING     LASER ABLATION OF THE CERVIX     NASAL SINUS SURGERY     RENAL CRYOABLATION  02/16/11   R kidney due to RCC (IR procedure)   TOTAL KNEE ARTHROPLASTY Right 10/30/2016   Procedure: RIGHT TOTAL KNEE ARTHROPLASTY;  Surgeon: Addie Glendia Hacker, MD;  Location: [facility];  Service: Orthopedics;  Laterality: Right;   TUBAL LIGATION     UMBILICAL HERNIA REPAIR      Family Psychiatric History: Nothing significant  Family History:  Family History  Problem Relation Age of Onset   Diabetes Mother    Hypertension Mother     Hyperlipidemia Mother    Heart disease Mother    Stroke Mother    Kidney disease Mother    Thyroid  disease Mother    Sleep apnea Mother    Obesity Mother    Memory loss Mother    Kidney disease Father    Prostate cancer Father    Alcoholism Father    Alcohol abuse Father    Multiple sclerosis Sister    Sleep apnea Sister    Colitis Brother    Alcohol abuse Brother    Leukemia Brother    Sleep apnea Brother    Breast cancer Maternal Aunt    Alcohol abuse Daughter    Breast cancer Cousin    Cancer Other    Asthma Other    Colon polyps Other    Colon cancer Neg Hx    Esophageal cancer Neg Hx    Pancreatic cancer Neg Hx    Rectal cancer Neg Hx    Stomach cancer Neg Hx     Social History:   Social History   Socioeconomic History   Marital status: Legally Separated    Spouse name: Not on file   Number of children: 4   Years of education: 12   Highest education level: High school graduate  Occupational History   Occupation: Disabled  Tobacco Use   Smoking status: Never    Passive exposure: Past   Smokeless tobacco: Never  Vaping Use   Vaping status: Never Used  Substance and Sexual Activity   Alcohol use: No   Drug use: No   Sexual activity: Never  Other Topics Concern   Not on file  Social History Narrative   Right handed   Lives in a two story apartment building    Retired    Lives with grandson    Social Drivers of Health   Tobacco Use: Low Risk (05/28/2024)   Patient History    Smoking Tobacco Use: Never    Smokeless Tobacco Use: Never    Passive Exposure: Past  Financial Resource Strain: Low Risk (03/30/2022)   Overall Financial Resource Strain (CARDIA)    Difficulty of Paying Living Expenses: Not hard at all  Food Insecurity: Low Risk (02/20/2024)   Received from [facility]   Epic    Within the past 12 months, the food you bought just didn't last and you didn't have money to get more. : Never  true    Within the past 12 months, you worried that  your food would run out before you got money to buy more: Never true  Transportation Needs: No Transportation Needs (02/20/2024)   Received from [facility]   Transportation    In the past 12 months, has lack of reliable transportation kept you from medical appointments, meetings, work or from getting things needed for daily living? : No  Physical Activity: Inactive (03/30/2022)   Exercise Vital Sign    Days of Exercise per Week: 0 days    Minutes of Exercise per Session: 0 min  Stress: No Stress Concern Present (03/30/2022)   [employer] of Occupational Health - Occupational Stress Questionnaire    Feeling of Stress : Not at all  Social Connections: Moderately Integrated (09/15/2023)   Social Connection and Isolation Panel    Frequency of Communication with Friends and Family: Twice a week    Frequency of Social Gatherings with Friends and Family: More than three times a week    Attends Religious Services: More than 4 times per year    Active Member of Clubs or Organizations: Yes    Attends Banker Meetings: More than 4 times per year    Marital Status: Separated  Depression (PHQ2-9): Low Risk (01/27/2024)   Depression (PHQ2-9)    PHQ-2 Score: 1  Recent Concern: Depression (PHQ2-9) - Medium Risk (12/24/2023)   Depression (PHQ2-9)    PHQ-2 Score: 8  Alcohol Screen: Low Risk (03/30/2022)   Alcohol Screen    Last Alcohol Screening Score (AUDIT): 0  Housing: Low Risk (02/20/2024)   Received from [facility]   Epic    Think about the place you live. Do you have problems with any of the following? Choose all that apply:: None/None on this list    What is your living situation today?: Not on file  Utilities: Low Risk (02/20/2024)   Received from [facility]   Utilities    In the past 12 months has the electric, gas, oil, or water  company threatened to shut off services in your home? : No  Health Literacy: Not on file    Additional Social History: Patient lives with  her daughter on the weekdays and at her home apartment on the weekends.  Allergies:   Allergies  Allergen Reactions   Sertraline Hcl Other (See Comments)    Spasms; numbness   Ace Inhibitors Cough   Codeine Palpitations   Darifenacin Hydrobromide Er Other (See Comments)    Pt states made her feel queezy & drunk   Gabapentin  Other (See Comments)    Hallucinations   Pravastatin  Other (See Comments)    myalgias   Propoxyphene Hcl Palpitations   Seroquel  [Quetiapine ] Other (See Comments)    Woke up screaming and yelling after taking it   Wellbutrin [Bupropion Hcl] Other (See Comments)    Numbness of mouth/lips    Metabolic Disorder Labs: Lab Results  Component Value Date   HGBA1C 5.4 07/29/2023   No results found for: PROLACTIN Lab Results  Component Value Date   CHOL 150 07/29/2023   TRIG 110.0 07/29/2023   HDL 49.60 07/29/2023   CHOLHDL 3 07/29/2023   VLDL 22.0 07/29/2023   LDLCALC 79 07/29/2023   LDLCALC 71 12/21/2022   Lab Results  Component Value Date   TSH 1.819 09/13/2023    Therapeutic Level Labs: No results found for: LITHIUM No results found for: CBMZ Lab Results  Component Value Date   VALPROATE <10 (  L) 11/14/2023    Current Medications: Current Outpatient Medications  Medication Sig Dispense Refill   melatonin 3 MG TABS tablet Take 1 tablet (3 mg total) by mouth at bedtime. 90 tablet 0   Accu-Chek Softclix Lancets lancets 1 each 2 (two) times daily.     acetaminophen  (TYLENOL ) 500 MG tablet Take 1,000 mg by mouth every 6 (six) hours as needed for mild pain (pain score 1-3) or headache.     atorvastatin  (LIPITOR) 10 MG tablet TAKE ONE TABLET BY MOUTH DAILY AT 5PM 90 tablet 11   blood glucose meter kit and supplies KIT Dispense based on patient and insurance preference. Use up to four times daily as directed. (FOR R73.03). 1 each 0   Blood Glucose Monitoring Suppl DEVI 1 each by Does not apply route in the morning, at noon, and at bedtime. May  substitute to any manufacturer covered by patient's insurance. 1 each 0   citalopram  (CELEXA ) 10 MG tablet Take 1 tablet (10 mg total) by mouth at bedtime. 30 tablet 3   Continuous Glucose Receiver (DEXCOM G7 RECEIVER) DEVI 1 each by Does not apply route continuous. 1 each 0   dicyclomine  (BENTYL ) 10 MG capsule Take 1 capsule (10 mg total) by mouth 2 (two) times daily as needed (intestinal spasms). 90 capsule 0   Dimethicone-Zinc  Oxide-Vit A-D (A & D ZINC  OXIDE) CREA Apply 1 Application topically 2 (two) times daily. 113 g 0   estradiol  (ESTRACE ) 0.1 MG/GM vaginal cream Insert 2 grams vaginally twice weekly 42.5 g 5   Eszopiclone  3 MG TABS Take 1 tablet (3 mg total) by mouth at bedtime. Take immediately before bedtime 30 tablet 0   fluticasone  (FLONASE ) 50 MCG/ACT nasal spray Place 2 sprays into both nostrils daily. 16 g 11   folic acid  (FOLVITE ) 1 MG tablet TAKE ONE TABLET BY MOUTH DAILY AT 9AM 240 tablet 11   furosemide  (LASIX ) 20 MG tablet Take 1 tablet (20 mg total) by mouth daily as needed for fluid or edema.     ipratropium (ATROVENT) 0.06 % nasal spray Place 2 sprays into the nose 3 (three) times daily. As needed for clear watery nasal drip     lubiprostone  (AMITIZA ) 24 MCG capsule Take 1 capsule (24 mcg total) by mouth 2 (two) times daily with a meal. 60 capsule 5   polyethylene glycol powder (GLYCOLAX /MIRALAX ) 17 GM/SCOOP powder Take 17 g by mouth daily as needed. Dissolve 1 capful (17g) in 4-8 ounces of liquid and take by mouth daily. 3350 g 1   rivastigmine  (EXELON ) 6 MG capsule TAKE ONE CAPSULE (6 MG TOTAL) BY MOUTH TWICE DAILY @ 9AM-5PM 60 capsule 11   triamcinolone  cream (KENALOG ) 0.1 % Apply 1 Application topically 2 (two) times daily. 30 g 0   No current facility-administered medications for this visit.    Musculoskeletal: Strength & Muscle Tone: within normal limits Gait & Station: normal Patient leans: N/A  Psychiatric Specialty Exam:  Psychiatric Specialty Exam: Blood  pressure 125/78, pulse 75, height 5' 2 (1.575 m), weight 178 lb (80.7 kg).Body mass index is 32.56 kg/m. Review of Systems  General Appearance: Casual and Fairly Groomed  Eye Contact:  Good  Speech:  Clear and Coherent  Volume:  Normal  Mood:  Euthymic  Affect:  Congruent  Thought Content: Logical   Suicidal Thoughts:  No  Homicidal Thoughts:  No  Thought Process:  Linear  Orientation:  Full (Time, Place, and Person)    Memory: Immediate;   Good Recent;  Good Remote;   Good  Judgment:  Good  Insight:  Fair  Concentration:  Concentration: Good and Attention Span: Good  Recall:  not formally assessed   Fund of Knowledge: Good  Language: Good  Psychomotor Activity:  Normal  Akathisia:  No  AIMS (if indicated): not done  Assets:  Communication Skills Desire for Improvement Housing Social Support Transportation Vocational/Educational  ADL's:  Intact  Cognition: WNL  Sleep:  Fair    Screenings: GAD-7    Flowsheet Row Office Visit from 03/17/2018 in [facility]  Total GAD-7 Score 15   Mini-Mental    Flowsheet Row Office Visit from 10/28/2023 in [facility] Office Visit from 06/16/2018 in [facility]  Total Score (max 30 points ) 25 27   PHQ2-9    Flowsheet Row Office Visit from 01/27/2024 in [facility] Visit from 12/24/2023 in [facility] [place] Office Visit from 09/18/2023 in [facility] Office Visit from 07/29/2023 in [facility] [place] Office Visit from 12/21/2022 in [facility] [place]  PHQ-2 Total Score 1 2 5  0 1  PHQ-9 Total Score -- 8 12 -- 1   Flowsheet Row ED from 12/13/2023 in [facility] at [facility] ED from 11/14/2023 in [facility] Emergency Department at [facility] ED from 10/18/2023 in [facility] Emergency Department at [facility]  C-SSRS RISK CATEGORY No Risk No Risk No Risk     Collaboration of Care: Medication Management AEB Dr. Carvin, PCP notes  Patient/Guardian was advised Release of Information must be obtained prior to any record release in order to collaborate their care with an outside provider. Patient/Guardian was advised if they have not already done so to contact the registration department to sign all necessary forms in order for us  to release information regarding their care.   Consent: Patient/Guardian gives verbal consent for treatment and assignment of benefits for services provided during this visit. Patient/Guardian expressed understanding and agreed to proceed.   Eliot Bencivenga, MD 12/15/202511:31 AM

## 2024-05-28 ENCOUNTER — Ambulatory Visit: Admitting: Neurology

## 2024-05-28 ENCOUNTER — Encounter: Payer: Self-pay | Admitting: Neurology

## 2024-05-28 VITALS — BP 120/77 | HR 72 | Ht 62.0 in | Wt 178.0 lb

## 2024-05-28 DIAGNOSIS — G4719 Other hypersomnia: Secondary | ICD-10-CM

## 2024-05-28 DIAGNOSIS — G4700 Insomnia, unspecified: Secondary | ICD-10-CM | POA: Diagnosis not present

## 2024-05-28 DIAGNOSIS — F0280 Dementia in other diseases classified elsewhere without behavioral disturbance: Secondary | ICD-10-CM

## 2024-05-28 DIAGNOSIS — Z820 Family history of epilepsy and other diseases of the nervous system: Secondary | ICD-10-CM

## 2024-05-28 DIAGNOSIS — R351 Nocturia: Secondary | ICD-10-CM | POA: Diagnosis not present

## 2024-05-28 DIAGNOSIS — R0683 Snoring: Secondary | ICD-10-CM

## 2024-05-28 DIAGNOSIS — G3183 Dementia with Lewy bodies: Secondary | ICD-10-CM

## 2024-05-28 DIAGNOSIS — Z9189 Other specified personal risk factors, not elsewhere classified: Secondary | ICD-10-CM

## 2024-05-28 NOTE — Progress Notes (Signed)
 Subjective:    Patient ID: Janet Le is a 68 y.o. female.  HPI    True Mar, MD, PhD [facility] [address], [address] [address] [place], [place] [address]   Dear [name],  I saw your patient, Janet Le, upon your kind request in my sleep clinic today for initial consultation of her sleep disorder, in particular, concern for underlying obstructive sleep apnea.  The patient is accompanied by her daughter today.  As you know, [name] is a 68 year old female with an underlying complex medical history of dementia (LBD), followed by [facility] neurology, allergic rhinitis, arthralgia, cataracts, reflux disease, hypertension, low back pain, cervical radiculopathy, irritable bowel syndrome, laryngopharyngeal reflux, tinnitus, syncope, neuropathy, and obesity, who reports chronic difficulty initiating sleep and snoring.  She takes Lunesta  as needed.  She has her own place and her grandson lives with her, half the time she also stays with her daughter who is with her today.  She goes to bed generally around 9 and rise time is around 9 AM.  She takes the Lunesta  about 4 or 5 nights a week.  She has nocturia about 2-3 times per average night, denies recurrent morning headaches or nocturnal headaches.  She has a strong family history of sleep apnea.  Her mother had sleep apnea.  Her brother and 2 sisters have sleep apnea.  She does not drink any caffeine daily.  She is a non-smoker.  She does not drink any alcohol. Her Epworth sleepiness score is 7 out of 24, fatigue severity score is 55 out of 63.  She had sleep testing in 2019.  She had seen pulmonology in 2023 and another sleep study was ordered but not done.  She is currently not on PAP therapy. She had sleep testing on 04/29/2018 and I reviewed the report in her electronic chart.  She did not sleep very well, study was requested by pulmonology at the time her AHI was 0, O2 nadir 88%.    Her Past Medical History Is  Significant For: Past Medical History:  Diagnosis Date   Allergic rhinitis 08/31/2009   Arthralgia of hip 11/14/2017   Attention deficit hyperactivity disorder 06/06/2020   diagnosed as adult w/o formal testing   Bilateral leg edema 06/12/2018   Blepharitis 11/07/2019   Blood dyscrasia    platelets low in past   Breast asymmetry 04/08/2021   Cataract    Cervical radiculopathy 09/07/2020   Cervicalgia 05/30/2016   Chronic low back pain 07/18/2016   Degeneration of lumbar intervertebral disc 01/09/2018   Dyspnea on exertion 05/11/2019   Endometriosis    Esophageal stricture    Essential hypertension 05/06/2009   Excessive daytime sleepiness 05/11/2019   External hemorrhoids 08/28/2010   Fatigue 11/14/2017   Generalized anxiety disorder 10/08/2017   GERD (gastroesophageal reflux disease)    Glaucoma 06/06/2020   Gout 12/11/2013   Hiatal hernia    History of positive PPD 04/09/2022   History of latent TB-completed treatment     Hyperlipidemia 03/16/2010   Hypertensive retinopathy of both eyes 10/06/2019   IBS (irritable bowel syndrome) with chronic constipation 11/14/2010   Insomnia 12/21/2022   Iridocyclitis 10/06/2019   Laryngopharyngeal reflux (LPR) 07/03/2018   Lattice degeneration of both retinas 10/06/2019   Left lumbar radiculopathy 04/13/2012   Lewy body dementia 01/14/2023   Dr Skeet     LVH (left ventricular hypertrophy), mild 05/18/2017   Echo 04/2017 - mild LVH, normal EF, Grade 1 DD   Lymphedema  Major depressive disorder 10/08/2017   Migraine 06/14/2009   Morbid obesity 11/06/2013   Neuropathy 10/07/2017   Nocturnal hypoxemia 05/11/2019   Osteopenia 11/18/2015   06/10/2018: LFN -1.1, left radius 0.7, low FRAX.  No significant change from 2017  dexa 10/2015: t score  Spine -1.3, dual femur -0.9  Took alendronate  2014-2019   Pain in left knee 09/08/2014   Post traumatic stress disorder (PTSD)    Primary osteoarthritis involving multiple joints 03/10/2019    Bilateral hips, knees   Pseudophakia of both eyes 10/06/2019   Pure hypercholesterolemia 03/16/2010   REM sleep behaviors    Renal cell carcinoma    R cryoablation by IR 02/16/11, Dr. Luverne  Followed by Dr. Laurina  No evidence of residual disease on CT 12/13 and 12/14   Rib pain on left side 01/03/2021   S/P knee surgery 10/30/2016   Snoring 04/09/2022   Spondylolisthesis of lumbar region 01/09/2018   Syncope and collapse 05/16/2009   since childhood; associated with extreme heat   TB lung, latent 11/25/2019   Thrombocytopenia 05/30/2015   Saw hem 02/2017 - mild, chronic - advised to stop protonix , no concerning cause, plan for pcp to monitor   Tinnitus of right ear 07/03/2018   Type 2 diabetes mellitus without complication, without long-term current use of insulin  10/06/2019   Urinary incontinence 07/03/2021   Venous insufficiency of both lower extremities 04/06/2021   Visual hallucinations    Vulvar itching 01/03/2021    Her Past Surgical History Is Significant For: Past Surgical History:  Procedure Laterality Date   BREAST CYST EXCISION Bilateral over 10 years ago   No visable scar    CATARACT EXTRACTION, BILATERAL Bilateral 2020   COLONOSCOPY     fallopian tube removed     FUNCTIONAL ENDOSCOPIC SINUS SURGERY     HEMORRHOID BANDING     LASER ABLATION OF THE CERVIX     NASAL SINUS SURGERY     RENAL CRYOABLATION  02/16/11   R kidney due to RCC (IR procedure)   TOTAL KNEE ARTHROPLASTY Right 10/30/2016   Procedure: RIGHT TOTAL KNEE ARTHROPLASTY;  Surgeon: Addie Glendia Hacker, MD;  Location: [facility] OR;  Service: Orthopedics;  Laterality: Right;   TUBAL LIGATION     UMBILICAL HERNIA REPAIR      Her Family History Is Significant For: Family History  Problem Relation Age of Onset   Diabetes Mother    Hypertension Mother    Hyperlipidemia Mother    Heart disease Mother    Stroke Mother    Kidney disease Mother    Thyroid  disease Mother    Sleep apnea Mother    Obesity  Mother    Memory loss Mother    Kidney disease Father    Prostate cancer Father    Alcoholism Father    Alcohol abuse Father    Multiple sclerosis Sister    Sleep apnea Sister    Colitis Brother    Alcohol abuse Brother    Leukemia Brother    Sleep apnea Brother    Breast cancer Maternal Aunt    Alcohol abuse Daughter    Breast cancer Cousin    Cancer Other    Asthma Other    Colon polyps Other    Colon cancer Neg Hx    Esophageal cancer Neg Hx    Pancreatic cancer Neg Hx    Rectal cancer Neg Hx    Stomach cancer Neg Hx     Her Social History Is Significant For:  Social History   Socioeconomic History   Marital status: Legally Separated    Spouse name: Not on file   Number of children: 4   Years of education: 12   Highest education level: High school graduate  Occupational History   Occupation: Disabled  Tobacco Use   Smoking status: Never    Passive exposure: Past   Smokeless tobacco: Never  Vaping Use   Vaping status: Never Used  Substance and Sexual Activity   Alcohol use: No   Drug use: No   Sexual activity: Never  Other Topics Concern   Not on file  Social History Narrative   Right handed   Lives in a two story apartment building    Retired    Lives with grandson    Social Drivers of Corporate Investment Banker Strain: Low Risk  (03/30/2022)   Overall Financial Resource Strain (CARDIA)    Difficulty of Paying Living Expenses: Not hard at all  Food Insecurity: Low Risk  (02/20/2024)   Received from [facility]   Hunger Vital Sign    Within the past 12 months, the food you bought just didn't last and you didn't have money to get more. : Never true    Within the past 12 months, you worried that your food would run out before you got money to buy more: Never true  Transportation Needs: No Transportation Needs (02/20/2024)   Received from [facility]    In the past 12 months, has lack of reliable transportation kept you from medical  appointments, meetings, work or from getting things needed for daily living? : No  Physical Activity: Inactive (03/30/2022)   Exercise Vital Sign    Days of Exercise per Week: 0 days    Minutes of Exercise per Session: 0 min  Stress: No Stress Concern Present (03/30/2022)   [employer] of Occupational Health - Occupational Stress Questionnaire    Feeling of Stress : Not at all  Social Connections: Moderately Integrated (09/15/2023)   Social Connection and Isolation Panel    Frequency of Communication with Friends and Family: Twice a week    Frequency of Social Gatherings with Friends and Family: More than three times a week    Attends Religious Services: More than 4 times per year    Active Member of [employer] or Organizations: Yes    Attends Engineer, Structural: More than 4 times per year    Marital Status: Separated    Her Allergies Are:  Allergies  Allergen Reactions   Sertraline Hcl Other (See Comments)    Spasms; numbness   Ace Inhibitors Cough   Codeine Palpitations   Darifenacin Hydrobromide Er Other (See Comments)    Pt states made her feel queezy & drunk   Gabapentin  Other (See Comments)    Hallucinations   Pravastatin  Other (See Comments)    myalgias   Propoxyphene Hcl Palpitations   Seroquel  [Quetiapine ] Other (See Comments)    Woke up screaming and yelling after taking it   Wellbutrin [Bupropion Hcl] Other (See Comments)    Numbness of mouth/lips  :   Her Current Medications Are:  Outpatient Encounter Medications as of 05/28/2024  Medication Sig   Accu-Chek Softclix Lancets lancets 1 each 2 (two) times daily.   acetaminophen  (TYLENOL ) 500 MG tablet Take 1,000 mg by mouth every 6 (six) hours as needed for mild pain (pain score 1-3) or headache.   atorvastatin  (LIPITOR) 10 MG tablet TAKE ONE TABLET BY  MOUTH DAILY AT 5PM   blood glucose meter kit and supplies KIT Dispense based on patient and insurance preference. Use up to four times daily as directed.  (FOR R73.03).   Blood Glucose Monitoring Suppl DEVI 1 each by Does not apply route in the morning, at noon, and at bedtime. May substitute to any manufacturer covered by patient's insurance.   citalopram  (CELEXA ) 10 MG tablet TAKE ONE TABLET (10MG  TOTAL) BY MOUTH DAILY AT 9AM   Continuous Glucose Receiver (DEXCOM G7 RECEIVER) DEVI 1 each by Does not apply route continuous.   dicyclomine  (BENTYL ) 10 MG capsule Take 1 capsule (10 mg total) by mouth 2 (two) times daily as needed (intestinal spasms).   Dimethicone-Zinc  Oxide-Vit A-D (A & D ZINC  OXIDE) CREA Apply 1 Application topically 2 (two) times daily.   fluticasone  (FLONASE ) 50 MCG/ACT nasal spray Place 2 sprays into both nostrils daily.   folic acid  (FOLVITE ) 1 MG tablet TAKE ONE TABLET BY MOUTH DAILY AT 9AM   furosemide  (LASIX ) 20 MG tablet Take 1 tablet (20 mg total) by mouth daily as needed for fluid or edema.   ipratropium (ATROVENT) 0.06 % nasal spray Place 2 sprays into the nose 3 (three) times daily. As needed for clear watery nasal drip   lubiprostone  (AMITIZA ) 24 MCG capsule Take 1 capsule (24 mcg total) by mouth 2 (two) times daily with a meal.   polyethylene glycol powder (GLYCOLAX /MIRALAX ) 17 GM/SCOOP powder Take 17 g by mouth daily as needed. Dissolve 1 capful (17g) in 4-8 ounces of liquid and take by mouth daily.   rivastigmine  (EXELON ) 6 MG capsule TAKE ONE CAPSULE (6 MG TOTAL) BY MOUTH TWICE DAILY @ 9AM-5PM   triamcinolone  cream (KENALOG ) 0.1 % Apply 1 Application topically 2 (two) times daily.   estradiol  (ESTRACE ) 0.1 MG/GM vaginal cream Insert 2 grams vaginally twice weekly   Eszopiclone  3 MG TABS Take 1 tablet (3 mg total) by mouth at bedtime. Take immediately before bedtime   No facility-administered encounter medications on file as of 05/28/2024.  :   Review of Systems:  Out of a complete 14 point review of systems, all are reviewed and negative with the exception of these symptoms as listed below:   Review of  Systems  Objective:  Neurological Exam  Physical Exam Physical Examination:   Vitals:   05/28/24 1032  BP: 120/77  Pulse: 72    General Examination: The patient is a very pleasant 68 y.o. female in no acute distress. She appears well-developed and well-nourished and well groomed.   HEENT: Normocephalic, atraumatic, pupils are equal, round and reactive to light, extraocular tracking is good without limitation to gaze excursion or nystagmus noted. No photophobia.  Hearing is grossly intact.  Face is symmetric with normal facial animation. Speech is clear without dysarthria. There is no hypophonia. There is no lip, neck/head, jaw or voice tremor. Neck is supple with full range of passive and active motion. There are no carotid bruits on auscultation.  Airway/Oropharynx exam reveals: mild mouth dryness, adequate dental hygiene and moderate airway crowding, due to smaller airway entry, tonsils on the smaller side, Mallampati class III, elongated tongue.  Neck circumference 14-1/4 inches.  Mild overbite noted.  Tongue protrudes centrally and palate elevates symmetrically.  Chest: Clear to auscultation without wheezing, rhonchi or crackles noted.  Heart: S1+S2+0, regular and normal without murmurs, rubs or gallops noted.   Abdomen: Soft, non-tender and non-distended.  Extremities: There is swelling noted in the distal lower extremities bilaterally, particularly in  the ankle areas.   Skin: Warm and dry without trophic changes noted.   Musculoskeletal: exam reveals no obvious joint deformities.   Neurologically:  Mental status: The patient is awake, alert and oriented in all 4 spheres. Her immediate and remote memory, attention, language skills and fund of knowledge are fair.  Daughter supplements her history.   Cranial nerves II - XII are as described above under HEENT exam.  Motor exam: Normal bulk, strength and tone is noted. There is no obvious action or resting tremor.  Fine motor  skills and coordination: Mildly impaired globally.    Cerebellar testing: No dysmetria or intention tremor. There is no truncal or gait ataxia.  Sensory exam: intact to light touch in the upper and lower extremities.  Gait, station and balance: She stands with mild difficulty and walks with a 4 point cane.   Assessment and Plan:  In summary, TRACE CEDERBERG is a very pleasant 68 y.o.-year old female with an underlying complex medical history of dementia (LBD), followed by [facility] neurology, allergic rhinitis, arthralgia, cataracts, reflux disease, hypertension, low back pain, cervical radiculopathy, irritable bowel syndrome, laryngopharyngeal reflux, tinnitus, syncope, neuropathy, and obesity, whose history and physical exam are concerning for sleep disordered breathing, particularly obstructive sleep apnea (OSA). A laboratory attended sleep study is typically considered gold standard for evaluation of sleep disordered breathing.   I had a long chat with the patient and her daughter about my findings and the diagnosis of sleep apnea, particularly OSA, its prognosis and treatment options. We talked about medical/conservative treatments, surgical interventions and non-pharmacological approaches for symptom control. I explained, in particular, the risks and ramifications of untreated moderate to severe OSA, especially with respect to developing cardiovascular disease down the road, including congestive heart failure (CHF), difficult to treat hypertension, cardiac arrhythmias (particularly A-fib), neurovascular complications including TIA, stroke and dementia. Even type 2 diabetes has, in part, been linked to untreated OSA. Symptoms of untreated OSA may include (but may not be limited to) daytime sleepiness, nocturia (i.e. frequent nighttime urination), memory problems, mood irritability and suboptimally controlled or worsening mood disorder such as depression and/or anxiety, lack of energy, lack of  motivation, physical discomfort, as well as recurrent headaches, especially morning or nocturnal headaches. We talked about the importance of maintaining a healthy lifestyle and striving for healthy weight. I recommended a sleep study at this time. I outlined the differences between a laboratory attended sleep study which is considered more comprehensive and accurate over the option of a home sleep test (HST); the latter may lead to underestimation of sleep disordered breathing in some instances and does not help with diagnosing upper airway resistance syndrome and is not accurate enough to diagnose primary central sleep apnea typically. I outlined possible surgical and non-surgical treatment options of OSA, including the use of a positive airway pressure (PAP) device (i.e. CPAP, AutoPAP/APAP or BiPAP in certain circumstances), a custom-made dental device (aka oral appliance, which would require a referral to a specialist dentist or orthodontist typically, and is generally speaking not considered for patients with full dentures or edentulous state), upper airway surgical options, such as traditional UPPP (which is not considered a first-line treatment) or the Inspire device (hypoglossal nerve stimulator, which would involve a referral for consultation with an ENT surgeon, after careful selection, following inclusion criteria - also not first-line treatment). I explained the PAP treatment option to the patient in detail, as this is generally considered first-line treatment.  The patient indicated that she would be  willing to try PAP therapy, if the need arises. I explained the importance of being compliant with PAP treatment, not only for insurance purposes but primarily to improve patient's symptoms symptoms, and for the patient's long term health benefit, including to reduce Her cardiovascular risks longer-term.    We will pick up our discussion about the next steps and treatment options after testing.  We will  keep them posted as to the test results by phone call and/or MyChart messaging where possible.  We will plan to follow-up in sleep clinic accordingly as well.  I answered all their questions today and the patient and her daughter were in agreement.   I encouraged them to call with any interim questions, concerns, problems or updates or email us  through MyChart.  Generally speaking, sleep test authorizations may take up to 2 weeks, sometimes less, sometimes longer, the patient is encouraged to get in touch with us  if they do not hear back from the sleep lab staff directly within the next 2 weeks.  Thank you very much for allowing me to participate in the care of this nice patient. If I can be of any further assistance to you please do not hesitate to call me at [phone].  Sincerely,   True Mar, MD, PhD

## 2024-06-02 ENCOUNTER — Other Ambulatory Visit: Payer: Self-pay

## 2024-06-02 DIAGNOSIS — N390 Urinary tract infection, site not specified: Secondary | ICD-10-CM

## 2024-06-02 DIAGNOSIS — R300 Dysuria: Secondary | ICD-10-CM

## 2024-06-03 ENCOUNTER — Other Ambulatory Visit

## 2024-06-03 DIAGNOSIS — R300 Dysuria: Secondary | ICD-10-CM | POA: Diagnosis not present

## 2024-06-03 DIAGNOSIS — N390 Urinary tract infection, site not specified: Secondary | ICD-10-CM

## 2024-06-04 ENCOUNTER — Telehealth: Payer: Self-pay

## 2024-06-04 ENCOUNTER — Ambulatory Visit: Payer: Self-pay | Admitting: Family Medicine

## 2024-06-04 LAB — URINALYSIS, ROUTINE W REFLEX MICROSCOPIC
Bilirubin Urine: NEGATIVE
Hgb urine dipstick: NEGATIVE
Ketones, ur: NEGATIVE
Nitrite: NEGATIVE
RBC / HPF: NONE SEEN (ref 0–?)
Specific Gravity, Urine: 1.015 (ref 1.000–1.030)
Total Protein, Urine: NEGATIVE
Urine Glucose: NEGATIVE
Urobilinogen, UA: 0.2 (ref 0.0–1.0)
pH: 7.5 (ref 5.0–8.0)

## 2024-06-04 LAB — URINE CULTURE: Result:: NO GROWTH

## 2024-06-04 NOTE — Telephone Encounter (Signed)
 Spoke with patients daughter, waiting for Urine Culture will call her back once it comes back

## 2024-06-04 NOTE — Telephone Encounter (Signed)
 Copied from CRM #[id number]. Topic: Clinical - Lab/Test Results >> Jun 04, 2024  4:09 PM Shereese L wrote: Reason for CRM: patient daughter called ot go over lab results and would like a call back  [employer] (Daughter:[phone]

## 2024-06-05 NOTE — Telephone Encounter (Signed)
 Spoke with patients daughter, gave verbal understanding

## 2024-06-08 ENCOUNTER — Encounter: Payer: Self-pay | Admitting: Family Medicine

## 2024-06-08 ENCOUNTER — Ambulatory Visit (HOSPITAL_COMMUNITY)

## 2024-06-08 ENCOUNTER — Other Ambulatory Visit: Payer: Self-pay | Admitting: Family Medicine

## 2024-06-08 VITALS — BP 125/78 | HR 75 | Ht 62.0 in | Wt 178.0 lb

## 2024-06-08 DIAGNOSIS — G479 Sleep disorder, unspecified: Secondary | ICD-10-CM

## 2024-06-08 DIAGNOSIS — F02B3 Dementia in other diseases classified elsewhere, moderate, with mood disturbance: Secondary | ICD-10-CM

## 2024-06-08 DIAGNOSIS — G4752 REM sleep behavior disorder: Secondary | ICD-10-CM

## 2024-06-08 DIAGNOSIS — G4700 Insomnia, unspecified: Secondary | ICD-10-CM

## 2024-06-08 DIAGNOSIS — F411 Generalized anxiety disorder: Secondary | ICD-10-CM

## 2024-06-08 MED ORDER — MELATONIN 3 MG PO TABS: 3.0000 mg | ORAL_TABLET | Freq: Every day | ORAL | 0 refills | Status: AC

## 2024-06-08 MED ORDER — CITALOPRAM HYDROBROMIDE 10 MG PO TABS: 10.0000 mg | ORAL_TABLET | Freq: Every day | ORAL | 3 refills | Status: AC

## 2024-06-08 NOTE — Addendum Note (Signed)
 Addended by: CARVIN CROCK on: 06/08/2024 03:15 PM   Modules accepted: Level of Service

## 2024-06-09 ENCOUNTER — Ambulatory Visit: Admitting: Family Medicine

## 2024-06-10 ENCOUNTER — Other Ambulatory Visit: Payer: Self-pay | Admitting: Family Medicine

## 2024-06-10 DIAGNOSIS — G4700 Insomnia, unspecified: Secondary | ICD-10-CM

## 2024-06-11 NOTE — Progress Notes (Unsigned)
 Chief Complaint: Primary GI MD: Dr. Albertus  HPI:  Janet Le is a 68 year old African-American female with a past medical history as listed below including chronic constipation, GERD, RCC status post cryoablation, hypertension and diabetes,      02/09/2019 patient seen in clinic for follow-up of upper abdominal pain.  At that time her symptoms had improved with improving psychosocial stressors.  She was given Bentyl  10 mg to use as needed for crampy abdominal pain.  Continued on Linzess  145 mcg daily for constipation.  At that time arrange for hemorrhoid banding.    06/11/2019 EGD and colonoscopy.  EGD with moderate gastritis and otherwise normal.  Colonoscopy with 1 3 mm polyp in the transverse colon, 2 3-4 mm polyps in the descending colon, melanosis in the colon and scars (as expected) in the rectum from previous hemorrhoidal banding. Pathology showed mild chronic gastritis and tubular adenomas.  Repeat colonoscopy recommended in 5 years.    01/2021 seen by Delon Failing, PA for chronic constipation and put on Linzess  145 mcg send alternate with MiraLAX         Discussed the use of AI scribe software for clinical note transcription with the patient, who gave verbal consent to proceed.  History of Present Illness      PREVIOUS GI WORKUP     Past Medical History:  Diagnosis Date   Allergic rhinitis 08/31/2009   Arthralgia of hip 11/14/2017   Attention deficit hyperactivity disorder 06/06/2020   diagnosed as adult w/o formal testing   Bilateral leg edema 06/12/2018   Blepharitis 11/07/2019   Blood dyscrasia    platelets low in past   Breast asymmetry 04/08/2021   Cataract    Cervical radiculopathy 09/07/2020   Cervicalgia 05/30/2016   Chronic low back pain 07/18/2016   Degeneration of lumbar intervertebral disc 01/09/2018   Dyspnea on exertion 05/11/2019   Endometriosis    Esophageal stricture    Essential hypertension 05/06/2009   Excessive daytime sleepiness  05/11/2019   External hemorrhoids 08/28/2010   Fatigue 11/14/2017   Generalized anxiety disorder 10/08/2017   GERD (gastroesophageal reflux disease)    Glaucoma 06/06/2020   Gout 12/11/2013   Hiatal hernia    History of positive PPD 04/09/2022   History of latent TB-completed treatment     Hyperlipidemia 03/16/2010   Hypertensive retinopathy of both eyes 10/06/2019   IBS (irritable bowel syndrome) with chronic constipation 11/14/2010   Insomnia 12/21/2022   Iridocyclitis 10/06/2019   Laryngopharyngeal reflux (LPR) 07/03/2018   Lattice degeneration of both retinas 10/06/2019   Left lumbar radiculopathy 04/13/2012   Lewy body dementia 01/14/2023   Dr Skeet     LVH (left ventricular hypertrophy), mild 05/18/2017   Echo 04/2017 - mild LVH, normal EF, Grade 1 DD   Lymphedema    Major depressive disorder 10/08/2017   Migraine 06/14/2009   Morbid obesity 11/06/2013   Neuropathy 10/07/2017   Nocturnal hypoxemia 05/11/2019   Osteopenia 11/18/2015   06/10/2018: LFN -1.1, left radius 0.7, low FRAX.  No significant change from 2017  dexa 10/2015: t score  Spine -1.3, dual femur -0.9  Took alendronate  2014-2019   Pain in left knee 09/08/2014   Post traumatic stress disorder (PTSD)    Primary osteoarthritis involving multiple joints 03/10/2019   Bilateral hips, knees   Pseudophakia of both eyes 10/06/2019   Pure hypercholesterolemia 03/16/2010   REM sleep behaviors    Renal cell carcinoma    R cryoablation by IR 02/16/11, Dr. Luverne  Followed by  Dr. Laurina  No evidence of residual disease on CT 12/13 and 12/14   Rib pain on left side 01/03/2021   S/P knee surgery 10/30/2016   Snoring 04/09/2022   Spondylolisthesis of lumbar region 01/09/2018   Syncope and collapse 05/16/2009   since childhood; associated with extreme heat   TB lung, latent 11/25/2019   Thrombocytopenia 05/30/2015   Saw hem 02/2017 - mild, chronic - advised to stop protonix , no concerning cause, plan for pcp to  monitor   Tinnitus of right ear 07/03/2018   Type 2 diabetes mellitus without complication, without long-term current use of insulin  10/06/2019   Urinary incontinence 07/03/2021   Venous insufficiency of both lower extremities 04/06/2021   Visual hallucinations    Vulvar itching 01/03/2021    Past Surgical History:  Procedure Laterality Date   BREAST CYST EXCISION Bilateral over 10 years ago   No visable scar    CATARACT EXTRACTION, BILATERAL Bilateral 2020   COLONOSCOPY     fallopian tube removed     FUNCTIONAL ENDOSCOPIC SINUS SURGERY     HEMORRHOID BANDING     LASER ABLATION OF THE CERVIX     NASAL SINUS SURGERY     RENAL CRYOABLATION  02/16/11   R kidney due to RCC (IR procedure)   TOTAL KNEE ARTHROPLASTY Right 10/30/2016   Procedure: RIGHT TOTAL KNEE ARTHROPLASTY;  Surgeon: Addie Glendia Hacker, MD;  Location: [facility] OR;  Service: Orthopedics;  Laterality: Right;   TUBAL LIGATION     UMBILICAL HERNIA REPAIR      Current Outpatient Medications  Medication Sig Dispense Refill   Accu-Chek Softclix Lancets lancets 1 each 2 (two) times daily.     acetaminophen  (TYLENOL ) 500 MG tablet Take 1,000 mg by mouth every 6 (six) hours as needed for mild pain (pain score 1-3) or headache.     atorvastatin  (LIPITOR) 10 MG tablet TAKE ONE TABLET BY MOUTH DAILY AT 5PM 90 tablet 11   blood glucose meter kit and supplies KIT Dispense based on patient and insurance preference. Use up to four times daily as directed. (FOR R73.03). 1 each 0   Blood Glucose Monitoring Suppl DEVI 1 each by Does not apply route in the morning, at noon, and at bedtime. May substitute to any manufacturer covered by patient's insurance. 1 each 0   citalopram  (CELEXA ) 10 MG tablet Take 1 tablet (10 mg total) by mouth at bedtime. 30 tablet 3   Continuous Glucose Receiver (DEXCOM G7 RECEIVER) DEVI 1 each by Does not apply route continuous. 1 each 0   dicyclomine  (BENTYL ) 10 MG capsule Take 1 capsule (10 mg total) by mouth 2 (two)  times daily as needed (intestinal spasms). 90 capsule 0   Dimethicone-Zinc  Oxide-Vit A-D (A & D ZINC  OXIDE) CREA Apply 1 Application topically 2 (two) times daily. 113 g 0   estradiol  (ESTRACE ) 0.1 MG/GM vaginal cream Insert 2 grams vaginally twice weekly 42.5 g 5   Eszopiclone  3 MG TABS TAKE 1 TABLET BY MOUTH AT BEDTIME *IMMEDIATELY BEFORE BEDTIME* 30 tablet 0   fluticasone  (FLONASE ) 50 MCG/ACT nasal spray Place 2 sprays into both nostrils daily. 16 g 11   folic acid  (FOLVITE ) 1 MG tablet TAKE ONE TABLET BY MOUTH DAILY AT 9AM 240 tablet 11   furosemide  (LASIX ) 20 MG tablet Take 1 tablet (20 mg total) by mouth daily as needed for fluid or edema.     ipratropium (ATROVENT) 0.06 % nasal spray Place 2 sprays into the nose 3 (three) times  daily. As needed for clear watery nasal drip     lubiprostone  (AMITIZA ) 24 MCG capsule Take 1 capsule (24 mcg total) by mouth 2 (two) times daily with a meal. 60 capsule 5   melatonin 3 MG TABS tablet Take 1 tablet (3 mg total) by mouth at bedtime. 90 tablet 0   polyethylene glycol powder (GLYCOLAX /MIRALAX ) 17 GM/SCOOP powder Take 17 g by mouth daily as needed. Dissolve 1 capful (17g) in 4-8 ounces of liquid and take by mouth daily. 3350 g 1   rivastigmine  (EXELON ) 6 MG capsule TAKE ONE CAPSULE (6 MG TOTAL) BY MOUTH TWICE DAILY @ 9AM-5PM 60 capsule 11   triamcinolone  cream (KENALOG ) 0.1 % Apply 1 Application topically 2 (two) times daily. 30 g 0   No current facility-administered medications for this visit.    Allergies as of 06/12/2024 - Review Complete 05/28/2024  Allergen Reaction Noted   Sertraline hcl Other (See Comments) 04/11/2011   Ace inhibitors Cough 07/30/2012   Codeine Palpitations    Darifenacin hydrobromide er Other (See Comments) 05/14/2011   Gabapentin  Other (See Comments) 10/23/2011   Pravastatin  Other (See Comments) 01/06/2014   Propoxyphene hcl Palpitations    Seroquel  [quetiapine ] Other (See Comments) 10/24/2021   Wellbutrin [bupropion  hcl] Other (See Comments) 01/10/2011    Family History  Problem Relation Age of Onset   Diabetes Mother    Hypertension Mother    Hyperlipidemia Mother    Heart disease Mother    Stroke Mother    Kidney disease Mother    Thyroid  disease Mother    Sleep apnea Mother    Obesity Mother    Memory loss Mother    Kidney disease Father    Prostate cancer Father    Alcoholism Father    Alcohol abuse Father    Multiple sclerosis Sister    Sleep apnea Sister    Colitis Brother    Alcohol abuse Brother    Leukemia Brother    Sleep apnea Brother    Breast cancer Maternal Aunt    Alcohol abuse Daughter    Breast cancer Cousin    Cancer Other    Asthma Other    Colon polyps Other    Colon cancer Neg Hx    Esophageal cancer Neg Hx    Pancreatic cancer Neg Hx    Rectal cancer Neg Hx    Stomach cancer Neg Hx     Social History   Socioeconomic History   Marital status: Legally Separated    Spouse name: Not on file   Number of children: 4   Years of education: 12   Highest education level: High school graduate  Occupational History   Occupation: Disabled  Tobacco Use   Smoking status: Never    Passive exposure: Past   Smokeless tobacco: Never  Vaping Use   Vaping status: Never Used  Substance and Sexual Activity   Alcohol use: No   Drug use: No   Sexual activity: Never  Other Topics Concern   Not on file  Social History Narrative   Right handed   Lives in a two story apartment building    Retired    Lives with grandson    Social Drivers of Health   Tobacco Use: Low Risk (05/28/2024)   Patient History    Smoking Tobacco Use: Never    Smokeless Tobacco Use: Never    Passive Exposure: Past  Financial Resource Strain: Low Risk (03/30/2022)   Overall Financial Resource Strain (CARDIA)  Difficulty of Paying Living Expenses: Not hard at all  Food Insecurity: Low Risk (02/20/2024)   Received from [facility]   Epic    Within the past 12 months, the food you bought  just didn't last and you didn't have money to get more. : Never true    Within the past 12 months, you worried that your food would run out before you got money to buy more: Never true  Transportation Needs: No Transportation Needs (02/20/2024)   Received from [facility]   Transportation    In the past 12 months, has lack of reliable transportation kept you from medical appointments, meetings, work or from getting things needed for daily living? : No  Physical Activity: Inactive (03/30/2022)   Exercise Vital Sign    Days of Exercise per Week: 0 days    Minutes of Exercise per Session: 0 min  Stress: No Stress Concern Present (03/30/2022)   [employer] of Occupational Health - Occupational Stress Questionnaire    Feeling of Stress : Not at all  Social Connections: Moderately Integrated (09/15/2023)   Social Connection and Isolation Panel    Frequency of Communication with Friends and Family: Twice a week    Frequency of Social Gatherings with Friends and Family: More than three times a week    Attends Religious Services: More than 4 times per year    Active Member of Clubs or Organizations: Yes    Attends Banker Meetings: More than 4 times per year    Marital Status: Separated  Intimate Partner Violence: Not At Risk (09/15/2023)   Humiliation, Afraid, Rape, and Kick questionnaire    Fear of Current or Ex-Partner: No    Emotionally Abused: No    Physically Abused: No    Sexually Abused: No  Depression (PHQ2-9): Low Risk (01/27/2024)   Depression (PHQ2-9)    PHQ-2 Score: 1  Recent Concern: Depression (PHQ2-9) - Medium Risk (12/24/2023)   Depression (PHQ2-9)    PHQ-2 Score: 8  Alcohol Screen: Low Risk (03/30/2022)   Alcohol Screen    Last Alcohol Screening Score (AUDIT): 0  Housing: Low Risk (02/20/2024)   [facility]   Epic    Think about the place you live. Do you have problems with any of the following? Choose all that apply:: None/None on this list     What is your living situation today?: Not on file  Utilities: Low Risk (02/20/2024)   Received from [facility]   Utilities    In the past 12 months has the electric, gas, oil, or water  company threatened to shut off services in your home? : No  Health Literacy: Not on file    Review of Systems:    Constitutional: No weight loss, fever, chills, weakness or fatigue HEENT: Eyes: No change in vision               Ears, Nose, Throat:  No change in hearing or congestion Skin: No rash or itching Cardiovascular: No chest pain, chest pressure or palpitations   Respiratory: No SOB or cough Gastrointestinal: See HPI and otherwise negative Genitourinary: No dysuria or change in urinary frequency Neurological: No headache, dizziness or syncope Musculoskeletal: No new muscle or joint pain Hematologic: No bleeding or bruising Psychiatric: No history of depression or anxiety    Physical Exam:  Vital signs: There were no vitals taken for this visit.  Constitutional: NAD, alert and cooperative Head:  Normocephalic and atraumatic. Eyes:   PEERL, EOMI.  No icterus. Conjunctiva pink. Respiratory: Respirations even and unlabored. Lungs clear to auscultation bilaterally.   No wheezes, crackles, or rhonchi.  Cardiovascular:  Regular rate and rhythm. No peripheral edema, cyanosis or pallor.  Gastrointestinal:  Soft, nondistended, nontender. No rebound or guarding. Normal bowel sounds. No appreciable masses or hepatomegaly. Rectal:  Declines Msk:  Symmetrical without gross deformities. Without edema, no deformity or joint abnormality.  Neurologic:  Alert and  oriented x4;  grossly normal neurologically.  Skin:   Dry and intact without significant lesions or rashes. Psychiatric: Oriented to person, place and time. Demonstrates good judgement and reason without abnormal affect or behaviors.  Physical Exam    RELEVANT LABS AND IMAGING: CBC    Component Value Date/Time   WBC 10.0 12/14/2023  0516   RBC 3.93 12/14/2023 0516   HGB 12.7 12/14/2023 0516   HGB 11.9 02/28/2017 1035   HCT 41.2 12/14/2023 0516   HCT 37.0 02/28/2017 1035   PLT 120 (L) 12/14/2023 0516   PLT 114 (L) 02/28/2017 1035   MCV 104.8 (H) 12/14/2023 0516   MCV 94.9 02/28/2017 1035   MCH 32.3 12/14/2023 0516   MCHC 30.8 12/14/2023 0516   RDW 14.4 12/14/2023 0516   RDW 15.5 (H) 02/28/2017 1035   LYMPHSABS 2.0 11/14/2023 1951   LYMPHSABS 1.8 02/28/2017 1035   MONOABS 0.8 11/14/2023 1951   MONOABS 0.5 02/28/2017 1035   EOSABS 0.0 11/14/2023 1951   EOSABS 0.1 02/28/2017 1035   BASOSABS 0.0 11/14/2023 1951   BASOSABS 0.0 02/28/2017 1035    CMP     Component Value Date/Time   NA 138 12/14/2023 0516   NA 142 03/05/2018 1040   NA 141 02/28/2017 1035   K 4.2 12/14/2023 0516   K 4.0 02/28/2017 1035   CL 109 12/14/2023 0516   CO2 21 (L) 12/14/2023 0516   CO2 28 02/28/2017 1035   GLUCOSE 78 12/14/2023 0516   GLUCOSE 80 02/28/2017 1035   BUN 6 (L) 12/14/2023 0516   BUN 11 03/05/2018 1040   BUN 9.0 02/28/2017 1035   CREATININE 0.70 12/14/2023 0516   CREATININE 0.9 02/28/2017 1035   CALCIUM  9.7 12/14/2023 0516   CALCIUM  10.1 02/28/2017 1035   PROT 5.9 (L) 12/14/2023 0516   PROT 6.8 03/05/2018 1040   PROT 7.3 02/28/2017 1035   ALBUMIN 3.0 (L) 12/14/2023 0516   ALBUMIN 4.1 03/05/2018 1040   ALBUMIN 3.4 (L) 02/28/2017 1035   AST 20 12/14/2023 0516   AST 18 02/28/2017 1035   ALT 19 12/14/2023 0516   ALT 17 02/28/2017 1035   ALKPHOS 79 12/14/2023 0516   ALKPHOS 92 02/28/2017 1035   BILITOT 0.5 12/14/2023 0516   BILITOT 0.3 03/05/2018 1040   BILITOT 0.27 02/28/2017 1035   GFRNONAA >60 12/14/2023 0516   GFRNONAA 71 05/25/2015 1508   GFRAA 90 03/05/2018 1040   GFRAA 82 05/25/2015 1508     Assessment/Plan:   History of colon polyps Colonoscopy 05/2019 with 3 tubular adenomas ranging 3 to 4 mm and a recall of 5 years.  Chronic constipation On Linzess  145 mcg and MiraLAX   Lewy body  dementia Diagnosed 2022   Janet Blower, [facility] 06/11/2024, 12:36 PM  Cc: Alvia [name] CROME, FNP

## 2024-06-12 ENCOUNTER — Ambulatory Visit: Admitting: Gastroenterology

## 2024-06-12 ENCOUNTER — Encounter: Payer: Self-pay | Admitting: Gastroenterology

## 2024-06-12 VITALS — BP 90/60 | HR 92 | Ht 62.0 in

## 2024-06-12 DIAGNOSIS — F02B3 Dementia in other diseases classified elsewhere, moderate, with mood disturbance: Secondary | ICD-10-CM | POA: Diagnosis not present

## 2024-06-12 DIAGNOSIS — G3183 Dementia with Lewy bodies: Secondary | ICD-10-CM | POA: Diagnosis not present

## 2024-06-12 DIAGNOSIS — K649 Unspecified hemorrhoids: Secondary | ICD-10-CM

## 2024-06-12 DIAGNOSIS — K5909 Other constipation: Secondary | ICD-10-CM | POA: Diagnosis not present

## 2024-06-12 DIAGNOSIS — Z860101 Personal history of adenomatous and serrated colon polyps: Secondary | ICD-10-CM

## 2024-06-12 DIAGNOSIS — I959 Hypotension, unspecified: Secondary | ICD-10-CM

## 2024-06-12 DIAGNOSIS — K581 Irritable bowel syndrome with constipation: Secondary | ICD-10-CM

## 2024-06-12 NOTE — Patient Instructions (Addendum)
 Your provider has requested that you go to the basement level for lab work before leaving today. Press B on the elevator. The lab is located at the first door on the left as you exit the elevator.  Due to recent changes in healthcare laws, you may see the results of your imaging and laboratory studies on MyChart before your provider has had a chance to review them.  We understand that in some cases there may be results that are confusing or concerning to you. Not all laboratory results come back in the same time frame and the provider may be waiting for multiple results in order to interpret others.  Please give us  48 hours in order for your provider to thoroughly review all the results before contacting the office for clarification of your results.    Start taking Miralax  1 capful (17 grams) 1x / day for 1 week.   If this is not effective, increase to 1 dose 2x / day for 1 week.   If this is still not effective, increase to two capfuls (34 grams) 2x / day.   Can adjust dose as needed based on response. Can take 1/2 cap daily, skip days, or increase per day.    If this is not strong enough please start Amitiza  8 mcg twice daily, if this is too strong can go down to once daily  Please get a squatty potty on [employer] to help with straining with bowel movements  Please go to emergency department if you have continued lightheadedness, dizziness, falls, syncope (fainting).  We are referring you to pelvic floor physical therapy.  They will contact you directly to schedule an appointment.  It may take a week or more before you hear from them.  Please feel free to contact us  if you have not heard from them within 2 weeks and we will follow up on the referral.   Thank you for trusting me with your gastrointestinal care!   Nestor Blower, PA  _______________________________________________________  If your blood pressure at your visit was 140/90 or greater, please contact your primary care physician  to follow up on this.  _______________________________________________________  If you are age 61 or older, your body mass index should be between 23-30. Your Body mass index is 32.56 kg/m. If this is out of the aforementioned range listed, please consider follow up with your Primary Care Provider.  If you are age 29 or younger, your body mass index should be between 19-25. Your Body mass index is 32.56 kg/m. If this is out of the aformentioned range listed, please consider follow up with your Primary Care Provider.   ________________________________________________________  The [facility] providers would like to encourage you to use MYCHART to communicate with providers for non-urgent requests or questions.  Due to long hold times on the telephone, sending your provider a message by [facility] may be a faster and more efficient way to get a response.  Please allow 48 business hours for a response.  Please remember that this is for non-urgent requests.  _______________________________________________________  [facility] is using a team-based approach to care.  Your team is made up of your doctor and two to three APPS. Our APPS (Nurse Practitioners and Physician Assistants) work with your physician to ensure care continuity for you. They are fully qualified to address your health concerns and develop a treatment plan. They communicate directly with your gastroenterologist to care for you. Seeing the Advanced Practice Practitioners on your physician's team can help you  by facilitating care more promptly, often allowing for earlier appointments, access to diagnostic testing, procedures, and other specialty referrals.

## 2024-06-15 ENCOUNTER — Telehealth: Payer: Self-pay | Admitting: Family Medicine

## 2024-06-15 NOTE — Telephone Encounter (Signed)
 LVM for patients daughter. Need clarification as to why forms are needed, new PCP is not aware of the need for the FL2

## 2024-06-15 NOTE — Telephone Encounter (Signed)
 Patient's daughter dropped off document FL2, to be filled out by provider. Patient requested to send it back via Call Patient to pick up within 7-days. Document is located in providers tray at front office.Please advise at Mobile [phone] (mobile)

## 2024-06-16 ENCOUNTER — Other Ambulatory Visit: Payer: Self-pay

## 2024-06-16 DIAGNOSIS — Z1211 Encounter for screening for malignant neoplasm of colon: Secondary | ICD-10-CM

## 2024-06-16 DIAGNOSIS — Z1231 Encounter for screening mammogram for malignant neoplasm of breast: Secondary | ICD-10-CM

## 2024-06-16 NOTE — Telephone Encounter (Signed)
 Spoke with patients daughter, orders placed. FL2 will be completed

## 2024-06-16 NOTE — Telephone Encounter (Signed)
 Spoke with patients daughter, form is needed for specialty services through patients insurance. Will get forms completed

## 2024-06-17 NOTE — Telephone Encounter (Signed)
 Form completed, daughter notified

## 2024-06-29 ENCOUNTER — Encounter: Payer: Self-pay | Admitting: Urology

## 2024-06-29 ENCOUNTER — Ambulatory Visit: Admitting: Urology

## 2024-07-02 NOTE — Telephone Encounter (Signed)
 Pt daughter called to verify this was complete and will be by to pick up tomorrow. She also would like to know if [name] can write a script for a scooter due to dizzy spells and other mobility issues she is concerned about. She is not sure is pt needs to be seen for another appt but she was just seen in 04/2024. Please call daughter back at [phone] to advise

## 2024-07-08 ENCOUNTER — Ambulatory Visit: Admitting: Neurology

## 2024-07-08 DIAGNOSIS — R351 Nocturia: Secondary | ICD-10-CM

## 2024-07-08 DIAGNOSIS — Z9189 Other specified personal risk factors, not elsewhere classified: Secondary | ICD-10-CM

## 2024-07-08 DIAGNOSIS — G4700 Insomnia, unspecified: Secondary | ICD-10-CM

## 2024-07-08 DIAGNOSIS — Z820 Family history of epilepsy and other diseases of the nervous system: Secondary | ICD-10-CM

## 2024-07-08 DIAGNOSIS — F0280 Dementia in other diseases classified elsewhere without behavioral disturbance: Secondary | ICD-10-CM

## 2024-07-08 DIAGNOSIS — G4719 Other hypersomnia: Secondary | ICD-10-CM

## 2024-07-08 DIAGNOSIS — R0683 Snoring: Secondary | ICD-10-CM

## 2024-07-08 NOTE — Progress Notes (Signed)
 Patient had an appointment today at 11 AM.  Sent patient link twice to join.  Waited for the patient until 11:15 AM, no one joined the call.  Her current appointment was marked as a no-show.

## 2024-07-10 ENCOUNTER — Ambulatory Visit: Admitting: Family Medicine

## 2024-07-10 ENCOUNTER — Ambulatory Visit
Admission: RE | Admit: 2024-07-10 | Discharge: 2024-07-10 | Disposition: A | Discharge status: Home / Self Care | Source: Ambulatory Visit | Attending: Family Medicine | Admitting: Family Medicine

## 2024-07-10 ENCOUNTER — Encounter: Payer: Self-pay | Admitting: Family Medicine

## 2024-07-10 VITALS — BP 120/74 | HR 72 | Temp 97.7°F | Ht 62.0 in | Wt 183.6 lb

## 2024-07-10 DIAGNOSIS — R55 Syncope and collapse: Secondary | ICD-10-CM | POA: Diagnosis not present

## 2024-07-10 DIAGNOSIS — F02B3 Dementia in other diseases classified elsewhere, moderate, with mood disturbance: Secondary | ICD-10-CM

## 2024-07-10 DIAGNOSIS — Z789 Other specified health status: Secondary | ICD-10-CM

## 2024-07-10 DIAGNOSIS — I890 Lymphedema, not elsewhere classified: Secondary | ICD-10-CM

## 2024-07-10 DIAGNOSIS — Z7409 Other reduced mobility: Secondary | ICD-10-CM

## 2024-07-10 DIAGNOSIS — G4700 Insomnia, unspecified: Secondary | ICD-10-CM

## 2024-07-10 DIAGNOSIS — R300 Dysuria: Secondary | ICD-10-CM

## 2024-07-10 DIAGNOSIS — Z23 Encounter for immunization: Secondary | ICD-10-CM

## 2024-07-10 DIAGNOSIS — G3183 Dementia with Lewy bodies: Secondary | ICD-10-CM | POA: Diagnosis not present

## 2024-07-10 DIAGNOSIS — R42 Dizziness and giddiness: Secondary | ICD-10-CM

## 2024-07-10 DIAGNOSIS — R2681 Unsteadiness on feet: Secondary | ICD-10-CM

## 2024-07-10 DIAGNOSIS — N390 Urinary tract infection, site not specified: Secondary | ICD-10-CM | POA: Diagnosis not present

## 2024-07-10 DIAGNOSIS — Z1231 Encounter for screening mammogram for malignant neoplasm of breast: Secondary | ICD-10-CM

## 2024-07-10 MED ORDER — ZOLPIDEM TARTRATE ER 12.5 MG PO TBCR: 12.5000 mg | EXTENDED_RELEASE_TABLET | Freq: Every evening | ORAL | 0 refills | Status: AC | PRN

## 2024-07-10 NOTE — Patient Instructions (Addendum)
 Urine culture sent to the lab. We will be in contact with results once they are received.   START Ambien  CR 12.5mg  once daily at bedtime   STOP Lunesta   We will send the order for a motorized chair in for you.   Follow-up with me for new or worsening symptoms.

## 2024-07-10 NOTE — Progress Notes (Signed)
 "  Acute Office Visit  Subjective:     Patient ID: Janet Le, female    DOB: 11-03-1955, 69 y.o.   MRN: [id number]  No chief complaint on file.   HPI  Discussed the use of AI scribe software for clinical note transcription with the patient, who gave verbal consent to proceed.  History of Present Illness Janet Le is a 69 year old female who presents for a follow-up visit regarding mobility issues and medication management.  Mobility impairment and fall risk - Frequent dizziness and lightheadedness, especially when walking through stores, resulting in fatigue and limiting ability to go out - Near fall in the bathroom on Monday due to dizziness, prevented by caregiver - Concern about falling due to caregiver's physical limitations and inability to assist during a fall event - Request for assistance obtaining a scooter for community mobility; open to wheelchair if scooter is not feasible  Sleep disturbance and medication management - Currently takes Lunesta  for sleep, but finds it insufficiently effective and requests dose adjustment - Previously tried Ambien , but experienced nightmares, possibly during urinary tract infections - Considering retrying Ambien  to improve sleep  Urinary tract symptoms and kidney stone history - Recurrent urinary tract infections; currently being evaluated for another infection with urine sent for culture - Stomach discomfort believed to be related to kidney stones  Cutaneous lesion - Blocked gland on the stomach, non-tender - History of hidradenitis suppurativa, but does not believe this lesion is related  Sinus drainage and rhinorrhea - New nasal spray prescribed for sinus drainage, not yet picked up - Persistent runny nose attributed to sinus issues     ROS Per HPI      Objective:    BP 120/74 (BP Location: Left Arm, Patient Position: Sitting)   Pulse 72   Temp 97.7 F (36.5 C) (Temporal)   Ht 5' 2 (1.575 m)   Wt 183 lb  9.6 oz (83.3 kg)   SpO2 95%   BMI 33.58 kg/m    Physical Exam Vitals and nursing note reviewed.  Constitutional:      General: She is not in acute distress.    Appearance: Normal appearance.  HENT:     Head: Normocephalic and atraumatic.     Right Ear: External ear normal.     Left Ear: External ear normal.     Nose: Nose normal.     Mouth/Throat:     Mouth: Mucous membranes are moist.     Pharynx: Oropharynx is clear.  Eyes:     Extraocular Movements: Extraocular movements intact.     Pupils: Pupils are equal, round, and reactive to light.  Cardiovascular:     Rate and Rhythm: Normal rate and regular rhythm.     Pulses: Normal pulses.     Heart sounds: Normal heart sounds.  Pulmonary:     Effort: Pulmonary effort is normal. No respiratory distress.     Breath sounds: Normal breath sounds. No wheezing, rhonchi or rales.  Musculoskeletal:     Cervical back: Normal range of motion.     Right lower leg: Edema (2+ nonpitting) present.     Left lower leg: Edema (2+ nonpitting) present.     Comments: In wheelchair for visit  Lymphadenopathy:     Cervical: No cervical adenopathy.  Neurological:     General: No focal deficit present.     Mental Status: She is alert and oriented to person, place, and time. Mental status is at baseline.  Psychiatric:  Mood and Affect: Mood normal.        Thought Content: Thought content normal.     No results found for any visits on 07/10/24.      Assessment & Plan:   Assessment and Plan Assessment & Plan Impaired mobility and ADLs, gait instability requiring assistive device Mobility impairment affects daily activities and increases fall risk. Recent fall due to dizziness. Concerns about independence and safety at home. -Currently uses a cane but cane, crutches, walker are insufficient if patient is overcome by dizziness or presyncope and will not prevent a fall like being seated in the chair will -Verify that there is enough  room in the home from the daughter for a motorized scooter -Patient has been mental and motor capacity to operate the scooter -Will be for primarily home use and will improve ability to safely move about the house encouraging independence in bathing and toileting, especially if she can get there on her own -Patient has expressed desire to use a motorized chair -Demonstrated safe wheelchair/assistive device transfer in the office today - Submitted documentation for scooter or wheelchair. - Consider wheelchair if scooter is not approved.  Recurrent urinary tract infections Recurrent UTIs with recent symptoms. Previous urine tests showed discrepancies between in-office and lab results. - Sent urine for culture. - Consider treatment based on culture results.  Insomnia, unspecified Chronic insomnia with Lunesta  at maximum dose. Previous Ambien  trial effective but caused nightmares. Discussed switching to Ambien  extended release. - Discontinued Lunesta . - Initiated Ambien  extended release. - Monitor response and adjust as needed.  Dizziness and syncope Recent dizziness episode led to fall. No clear etiology identified. Concerns about safety and independence. - Ensure safety measures to prevent falls.  Lymphedema Chronic lymphedema with ongoing management needs. Previous issues with equipment delivery and fitting. - Coordinated with home health care for equipment delivery and fitting.  Immunization due Discussion of pneumonia vaccination schedule. Recent vaccination received, next due in 2028. - Administered pneumonia vaccine.     Orders Placed This Encounter  Procedures   For home use only DME Other see comment    Motorized scooter    Length of Need:   Lifetime   Urine Culture    Standing Status:   Future    Number of Occurrences:   1    Expected Date:   07/10/2024    Expiration Date:   07/10/2025   Pneumococcal polysaccharide vaccine 23-valent greater than or equal to 2yo  subcutaneous/IM   Urinalysis, Routine w reflex microscopic    Standing Status:   Future    Number of Occurrences:   1    Expected Date:   07/10/2024    Expiration Date:   07/10/2025     Meds ordered this encounter  Medications   zolpidem  (AMBIEN  CR) 12.5 MG CR tablet    Sig: Take 1 tablet (12.5 mg total) by mouth at bedtime as needed for sleep.    Dispense:  30 tablet    Refill:  0    Return in about 6 months (around 01/07/2025) for meds eval.  [name] LITTIE Ku, FNP  "

## 2024-07-11 LAB — URINALYSIS, ROUTINE W REFLEX MICROSCOPIC
Bilirubin Urine: NEGATIVE
Glucose, UA: NEGATIVE
Hgb urine dipstick: NEGATIVE
Nitrite: NEGATIVE
RBC / HPF: NONE SEEN /HPF (ref 0–2)
Specific Gravity, Urine: 1.023 (ref 1.001–1.035)
pH: <=5 — AB (ref 5.0–8.0)

## 2024-07-11 LAB — URINE CULTURE: Result:: NO GROWTH

## 2024-07-11 LAB — MICROSCOPIC MESSAGE

## 2024-07-14 ENCOUNTER — Ambulatory Visit: Payer: Self-pay | Admitting: Family Medicine

## 2024-07-20 ENCOUNTER — Encounter (HOSPITAL_COMMUNITY)

## 2024-08-24 ENCOUNTER — Ambulatory Visit: Admitting: Gastroenterology

## 2024-09-03 ENCOUNTER — Encounter: Admitting: Physical Therapy

## 2024-09-10 ENCOUNTER — Encounter: Admitting: Physical Therapy

## 2024-09-14 ENCOUNTER — Ambulatory Visit: Admitting: Urology

## 2024-09-17 ENCOUNTER — Encounter: Admitting: Physical Therapy

## 2024-09-18 ENCOUNTER — Ambulatory Visit: Admitting: Physician Assistant

## 2024-09-24 ENCOUNTER — Encounter: Admitting: Physical Therapy

## 2024-10-01 ENCOUNTER — Encounter: Admitting: Physical Therapy

## 2024-12-01 ENCOUNTER — Ambulatory Visit: Admitting: Dermatology
